# Patient Record
Sex: Female | Born: 1970 | Race: Black or African American | Hispanic: No | Marital: Married | Smoking: Former smoker
Health system: Southern US, Community
[De-identification: ages and names within clinical notes are randomized; demographics above are authoritative.]

## PROBLEM LIST (undated history)

## (undated) DIAGNOSIS — E785 Hyperlipidemia, unspecified: Secondary | ICD-10-CM

## (undated) DIAGNOSIS — E282 Polycystic ovarian syndrome: Secondary | ICD-10-CM

## (undated) DIAGNOSIS — L309 Dermatitis, unspecified: Secondary | ICD-10-CM

## (undated) DIAGNOSIS — IMO0002 Reserved for concepts with insufficient information to code with codable children: Secondary | ICD-10-CM

## (undated) DIAGNOSIS — D219 Benign neoplasm of connective and other soft tissue, unspecified: Secondary | ICD-10-CM

## (undated) DIAGNOSIS — E669 Obesity, unspecified: Secondary | ICD-10-CM

## (undated) DIAGNOSIS — G4730 Sleep apnea, unspecified: Secondary | ICD-10-CM

## (undated) DIAGNOSIS — I10 Essential (primary) hypertension: Secondary | ICD-10-CM

## (undated) HISTORY — DX: Polycystic ovarian syndrome: E28.2

## (undated) HISTORY — DX: Obesity, unspecified: E66.9

## (undated) HISTORY — PX: OOPHORECTOMY: SHX86

## (undated) HISTORY — DX: Essential (primary) hypertension: I10

## (undated) HISTORY — DX: Hyperlipidemia, unspecified: E78.5

## (undated) HISTORY — DX: Benign neoplasm of connective and other soft tissue, unspecified: D21.9

## (undated) HISTORY — DX: Dermatitis, unspecified: L30.9

## (undated) HISTORY — PX: BACK SURGERY: SHX140

---

## 1998-03-25 ENCOUNTER — Encounter: Payer: Self-pay | Admitting: Family Medicine

## 1998-03-25 ENCOUNTER — Ambulatory Visit (HOSPITAL_COMMUNITY): Admission: RE | Admit: 1998-03-25 | Discharge: 1998-03-25 | Payer: Self-pay | Admitting: Family Medicine

## 1998-06-04 ENCOUNTER — Encounter: Payer: Self-pay | Admitting: Obstetrics and Gynecology

## 1998-06-04 ENCOUNTER — Inpatient Hospital Stay (HOSPITAL_COMMUNITY): Admission: AD | Admit: 1998-06-04 | Discharge: 1998-06-04 | Payer: Self-pay | Admitting: Obstetrics and Gynecology

## 1998-06-13 ENCOUNTER — Ambulatory Visit (HOSPITAL_COMMUNITY): Admission: RE | Admit: 1998-06-13 | Discharge: 1998-06-13 | Payer: Self-pay | Admitting: Obstetrics and Gynecology

## 1999-07-19 ENCOUNTER — Encounter: Admission: RE | Admit: 1999-07-19 | Discharge: 1999-10-17 | Payer: Self-pay | Admitting: Gynecology

## 1999-09-17 ENCOUNTER — Encounter (INDEPENDENT_AMBULATORY_CARE_PROVIDER_SITE_OTHER): Payer: Self-pay | Admitting: Specialist

## 1999-09-17 ENCOUNTER — Inpatient Hospital Stay (HOSPITAL_COMMUNITY): Admission: AD | Admit: 1999-09-17 | Discharge: 1999-09-23 | Payer: Self-pay | Admitting: Internal Medicine

## 1999-09-21 ENCOUNTER — Encounter: Payer: Self-pay | Admitting: Gynecology

## 1999-09-26 ENCOUNTER — Inpatient Hospital Stay (HOSPITAL_COMMUNITY): Admission: EM | Admit: 1999-09-26 | Discharge: 1999-09-26 | Payer: Self-pay | Admitting: Gynecology

## 1999-09-28 ENCOUNTER — Inpatient Hospital Stay (HOSPITAL_COMMUNITY): Admission: AD | Admit: 1999-09-28 | Discharge: 1999-09-28 | Payer: Self-pay | Admitting: Obstetrics & Gynecology

## 2000-11-27 ENCOUNTER — Other Ambulatory Visit: Admission: RE | Admit: 2000-11-27 | Discharge: 2000-11-27 | Payer: Self-pay | Admitting: Gynecology

## 2001-12-26 ENCOUNTER — Other Ambulatory Visit: Admission: RE | Admit: 2001-12-26 | Discharge: 2001-12-26 | Payer: Self-pay | Admitting: Family Medicine

## 2003-02-04 ENCOUNTER — Other Ambulatory Visit: Admission: RE | Admit: 2003-02-04 | Discharge: 2003-02-04 | Payer: Self-pay | Admitting: Family Medicine

## 2004-02-07 ENCOUNTER — Other Ambulatory Visit: Admission: RE | Admit: 2004-02-07 | Discharge: 2004-02-07 | Payer: Self-pay | Admitting: Family Medicine

## 2004-12-18 ENCOUNTER — Ambulatory Visit (HOSPITAL_COMMUNITY): Admission: RE | Admit: 2004-12-18 | Discharge: 2004-12-18 | Payer: Self-pay | Admitting: Obstetrics and Gynecology

## 2004-12-18 ENCOUNTER — Ambulatory Visit: Payer: Self-pay | Admitting: Obstetrics and Gynecology

## 2004-12-21 ENCOUNTER — Ambulatory Visit: Payer: Self-pay | Admitting: Obstetrics & Gynecology

## 2005-01-20 ENCOUNTER — Inpatient Hospital Stay (HOSPITAL_COMMUNITY): Admission: AD | Admit: 2005-01-20 | Discharge: 2005-01-20 | Payer: Self-pay | Admitting: Obstetrics and Gynecology

## 2005-02-01 ENCOUNTER — Encounter (INDEPENDENT_AMBULATORY_CARE_PROVIDER_SITE_OTHER): Payer: Self-pay | Admitting: *Deleted

## 2005-02-01 ENCOUNTER — Inpatient Hospital Stay (HOSPITAL_COMMUNITY): Admission: RE | Admit: 2005-02-01 | Discharge: 2005-02-03 | Payer: Self-pay | Admitting: Obstetrics and Gynecology

## 2005-02-01 HISTORY — PX: TUBAL LIGATION: SHX77

## 2005-05-29 ENCOUNTER — Other Ambulatory Visit: Admission: RE | Admit: 2005-05-29 | Discharge: 2005-05-29 | Payer: Self-pay | Admitting: Obstetrics and Gynecology

## 2005-12-19 ENCOUNTER — Encounter: Admission: RE | Admit: 2005-12-19 | Discharge: 2005-12-19 | Payer: Self-pay | Admitting: Obstetrics and Gynecology

## 2006-10-08 ENCOUNTER — Other Ambulatory Visit: Admission: RE | Admit: 2006-10-08 | Discharge: 2006-10-08 | Payer: Self-pay | Admitting: Family Medicine

## 2006-10-09 ENCOUNTER — Ambulatory Visit: Payer: Self-pay | Admitting: Family Medicine

## 2006-10-09 ENCOUNTER — Encounter: Payer: Self-pay | Admitting: Family Medicine

## 2006-10-09 DIAGNOSIS — B369 Superficial mycosis, unspecified: Secondary | ICD-10-CM | POA: Insufficient documentation

## 2006-10-09 DIAGNOSIS — I10 Essential (primary) hypertension: Secondary | ICD-10-CM | POA: Insufficient documentation

## 2006-10-09 LAB — CONVERTED CEMR LAB
Bilirubin Urine: NEGATIVE
Blood in Urine, dipstick: NEGATIVE
Glucose, Urine, Semiquant: NEGATIVE
Ketones, urine, test strip: NEGATIVE
Nitrite: NEGATIVE
Protein, U semiquant: NEGATIVE
Specific Gravity, Urine: 1.01
Urobilinogen, UA: NEGATIVE
WBC Urine, dipstick: NEGATIVE
pH: 6.5

## 2006-10-14 LAB — CONVERTED CEMR LAB
ALT: 25 units/L (ref 0–40)
AST: 21 units/L (ref 0–37)
Albumin: 3.9 g/dL (ref 3.5–5.2)
Alkaline Phosphatase: 62 units/L (ref 39–117)
BUN: 8 mg/dL (ref 6–23)
Basophils Absolute: 0 10*3/uL (ref 0.0–0.1)
Basophils Relative: 0.7 % (ref 0.0–1.0)
Bilirubin, Direct: 0.1 mg/dL (ref 0.0–0.3)
CO2: 29 meq/L (ref 19–32)
Calcium: 9.3 mg/dL (ref 8.4–10.5)
Chloride: 106 meq/L (ref 96–112)
Cholesterol: 184 mg/dL (ref 0–200)
Creatinine, Ser: 0.7 mg/dL (ref 0.4–1.2)
Eosinophils Absolute: 0.1 10*3/uL (ref 0.0–0.6)
Eosinophils Relative: 1.8 % (ref 0.0–5.0)
GFR calc Af Amer: 122 mL/min
GFR calc non Af Amer: 101 mL/min
Glucose, Bld: 86 mg/dL (ref 70–99)
HCT: 42.3 % (ref 36.0–46.0)
HDL: 34.1 mg/dL — ABNORMAL LOW (ref >39.0)
Hemoglobin: 14 g/dL (ref 12.0–15.0)
LDL Cholesterol: 124 mg/dL — ABNORMAL HIGH (ref 0–99)
Lymphocytes Relative: 48.4 % — ABNORMAL HIGH (ref 12.0–46.0)
MCHC: 33.1 g/dL (ref 30.0–36.0)
MCV: 96.4 fL (ref 78.0–100.0)
Monocytes Absolute: 0.6 10*3/uL (ref 0.2–0.7)
Monocytes Relative: 8.2 % (ref 3.0–11.0)
Neutro Abs: 2.8 10*3/uL (ref 1.4–7.7)
Neutrophils Relative %: 40.9 % — ABNORMAL LOW (ref 43.0–77.0)
Platelets: 221 10*3/uL (ref 150–400)
Potassium: 3.7 meq/L (ref 3.5–5.1)
RBC: 4.39 M/uL (ref 3.87–5.11)
RDW: 12.7 % (ref 11.5–14.6)
Sodium: 140 meq/L (ref 135–145)
TSH: 1.8 microintl units/mL (ref 0.35–5.50)
Total Bilirubin: 0.6 mg/dL (ref 0.3–1.2)
Total CHOL/HDL Ratio: 5.4
Total Protein: 7.2 g/dL (ref 6.0–8.3)
Triglycerides: 130 mg/dL (ref 0–149)
VLDL: 26 mg/dL (ref 0–40)
WBC: 6.8 10*3/uL (ref 4.5–10.5)

## 2006-10-16 ENCOUNTER — Encounter (INDEPENDENT_AMBULATORY_CARE_PROVIDER_SITE_OTHER): Payer: Self-pay | Admitting: *Deleted

## 2006-10-18 ENCOUNTER — Encounter (INDEPENDENT_AMBULATORY_CARE_PROVIDER_SITE_OTHER): Payer: Self-pay | Admitting: *Deleted

## 2006-11-07 ENCOUNTER — Ambulatory Visit: Payer: Self-pay | Admitting: Family Medicine

## 2006-11-07 DIAGNOSIS — N760 Acute vaginitis: Secondary | ICD-10-CM | POA: Insufficient documentation

## 2007-02-04 ENCOUNTER — Ambulatory Visit: Payer: Self-pay | Admitting: Family Medicine

## 2007-02-04 DIAGNOSIS — J069 Acute upper respiratory infection, unspecified: Secondary | ICD-10-CM | POA: Insufficient documentation

## 2007-02-04 DIAGNOSIS — H109 Unspecified conjunctivitis: Secondary | ICD-10-CM | POA: Insufficient documentation

## 2007-05-22 ENCOUNTER — Ambulatory Visit: Payer: Self-pay | Admitting: Internal Medicine

## 2007-05-22 DIAGNOSIS — E785 Hyperlipidemia, unspecified: Secondary | ICD-10-CM | POA: Insufficient documentation

## 2007-05-22 DIAGNOSIS — E8881 Metabolic syndrome: Secondary | ICD-10-CM | POA: Insufficient documentation

## 2007-12-26 ENCOUNTER — Ambulatory Visit: Payer: Self-pay | Admitting: Family Medicine

## 2008-01-04 LAB — CONVERTED CEMR LAB
ALT: 30 units/L (ref 0–35)
AST: 25 units/L (ref 0–37)
Albumin: 3.9 g/dL (ref 3.5–5.2)
Alkaline Phosphatase: 60 units/L (ref 39–117)
BUN: 10 mg/dL (ref 6–23)
Bilirubin, Direct: 0.1 mg/dL (ref 0.0–0.3)
CO2: 27 meq/L (ref 19–32)
Calcium: 9.3 mg/dL (ref 8.4–10.5)
Chloride: 108 meq/L (ref 96–112)
Cholesterol: 155 mg/dL (ref 0–200)
Creatinine, Ser: 0.7 mg/dL (ref 0.4–1.2)
GFR calc Af Amer: 121 mL/min
GFR calc non Af Amer: 100 mL/min
Glucose, Bld: 68 mg/dL — ABNORMAL LOW (ref 70–99)
HDL: 35.8 mg/dL — ABNORMAL LOW (ref >39.0)
LDL Cholesterol: 101 mg/dL — ABNORMAL HIGH (ref 0–99)
Potassium: 3.7 meq/L (ref 3.5–5.1)
Sodium: 142 meq/L (ref 135–145)
Total Bilirubin: 0.8 mg/dL (ref 0.3–1.2)
Total CHOL/HDL Ratio: 4.3
Total Protein: 7.3 g/dL (ref 6.0–8.3)
Triglycerides: 90 mg/dL (ref 0–149)
VLDL: 18 mg/dL (ref 0–40)

## 2008-01-05 ENCOUNTER — Encounter (INDEPENDENT_AMBULATORY_CARE_PROVIDER_SITE_OTHER): Payer: Self-pay | Admitting: *Deleted

## 2008-02-02 ENCOUNTER — Encounter: Payer: Self-pay | Admitting: Family Medicine

## 2008-04-07 ENCOUNTER — Encounter: Payer: Self-pay | Admitting: Family Medicine

## 2008-04-07 ENCOUNTER — Other Ambulatory Visit: Admission: RE | Admit: 2008-04-07 | Discharge: 2008-04-07 | Payer: Self-pay | Admitting: Family Medicine

## 2008-04-07 ENCOUNTER — Ambulatory Visit: Payer: Self-pay | Admitting: Family Medicine

## 2008-04-15 LAB — CONVERTED CEMR LAB
BUN: 8 mg/dL (ref 6–23)
Basophils Absolute: 0 10*3/uL (ref 0.0–0.1)
Basophils Relative: 0.1 % (ref 0.0–3.0)
CO2: 27 meq/L (ref 19–32)
Calcium: 9.5 mg/dL (ref 8.4–10.5)
Chloride: 106 meq/L (ref 96–112)
Creatinine, Ser: 0.8 mg/dL (ref 0.4–1.2)
Eosinophils Absolute: 0.2 10*3/uL (ref 0.0–0.7)
Eosinophils Relative: 2.6 % (ref 0.0–5.0)
GFR calc Af Amer: 104 mL/min
GFR calc non Af Amer: 86 mL/min
Glucose, Bld: 86 mg/dL (ref 70–99)
HCT: 39.2 % (ref 36.0–46.0)
Hemoglobin: 13.6 g/dL (ref 12.0–15.0)
Lymphocytes Relative: 47 % — ABNORMAL HIGH (ref 12.0–46.0)
MCHC: 34.8 g/dL (ref 30.0–36.0)
MCV: 94.7 fL (ref 78.0–100.0)
Monocytes Absolute: 0.7 10*3/uL (ref 0.1–1.0)
Monocytes Relative: 8.6 % (ref 3.0–12.0)
Neutro Abs: 3.2 10*3/uL (ref 1.4–7.7)
Neutrophils Relative %: 41.7 % — ABNORMAL LOW (ref 43.0–77.0)
Platelets: 187 10*3/uL (ref 150–400)
Potassium: 3.8 meq/L (ref 3.5–5.1)
RBC: 4.13 M/uL (ref 3.87–5.11)
RDW: 12.3 % (ref 11.5–14.6)
Sodium: 139 meq/L (ref 135–145)
TSH: 1.78 microintl units/mL (ref 0.35–5.50)
WBC: 7.6 10*3/uL (ref 4.5–10.5)

## 2008-04-19 ENCOUNTER — Encounter (INDEPENDENT_AMBULATORY_CARE_PROVIDER_SITE_OTHER): Payer: Self-pay | Admitting: *Deleted

## 2008-05-21 ENCOUNTER — Ambulatory Visit: Payer: Self-pay | Admitting: Family Medicine

## 2008-06-18 ENCOUNTER — Ambulatory Visit: Payer: Self-pay | Admitting: Family Medicine

## 2008-07-15 ENCOUNTER — Ambulatory Visit: Payer: Self-pay | Admitting: Family Medicine

## 2008-11-01 ENCOUNTER — Encounter: Payer: Self-pay | Admitting: Family Medicine

## 2008-12-03 ENCOUNTER — Ambulatory Visit: Payer: Self-pay | Admitting: Family Medicine

## 2008-12-03 DIAGNOSIS — F17200 Nicotine dependence, unspecified, uncomplicated: Secondary | ICD-10-CM | POA: Insufficient documentation

## 2009-01-07 ENCOUNTER — Ambulatory Visit: Payer: Self-pay | Admitting: Family Medicine

## 2009-03-07 ENCOUNTER — Ambulatory Visit: Payer: Self-pay | Admitting: Family Medicine

## 2009-03-07 DIAGNOSIS — R5383 Other fatigue: Secondary | ICD-10-CM

## 2009-03-07 DIAGNOSIS — F3289 Other specified depressive episodes: Secondary | ICD-10-CM | POA: Insufficient documentation

## 2009-03-07 DIAGNOSIS — F329 Major depressive disorder, single episode, unspecified: Secondary | ICD-10-CM | POA: Insufficient documentation

## 2009-03-07 DIAGNOSIS — R5381 Other malaise: Secondary | ICD-10-CM | POA: Insufficient documentation

## 2009-03-17 LAB — CONVERTED CEMR LAB: Vit D, 25-Hydroxy: 6 ng/mL — ABNORMAL LOW (ref 30–89)

## 2009-03-23 ENCOUNTER — Telehealth (INDEPENDENT_AMBULATORY_CARE_PROVIDER_SITE_OTHER): Payer: Self-pay | Admitting: *Deleted

## 2009-03-23 LAB — CONVERTED CEMR LAB
ALT: 26 units/L (ref 0–35)
AST: 21 units/L (ref 0–37)
Albumin: 3.6 g/dL (ref 3.5–5.2)
Alkaline Phosphatase: 53 units/L (ref 39–117)
BUN: 9 mg/dL (ref 6–23)
Basophils Relative: 2 % (ref 0.0–3.0)
Bilirubin, Direct: 0.1 mg/dL (ref 0.0–0.3)
CO2: 27 meq/L (ref 19–32)
Calcium: 8.6 mg/dL (ref 8.4–10.5)
Chloride: 108 meq/L (ref 96–112)
Creatinine, Ser: 0.6 mg/dL (ref 0.4–1.2)
Eosinophils Relative: 4 % (ref 0.0–5.0)
Folate: 9.1 ng/mL
GFR calc non Af Amer: 143.63 mL/min (ref >60)
Glucose, Bld: 99 mg/dL (ref 70–99)
HCT: 35.4 % — ABNORMAL LOW (ref 36.0–46.0)
Hemoglobin: 12.4 g/dL (ref 12.0–15.0)
Lymphocytes Relative: 60 % — ABNORMAL HIGH (ref 12.0–46.0)
MCHC: 35.1 g/dL (ref 30.0–36.0)
MCV: 99.5 fL (ref 78.0–100.0)
Monocytes Relative: 9 % (ref 3.0–12.0)
Neutrophils Relative %: 25 % — ABNORMAL LOW (ref 43.0–77.0)
Platelets: 167 10*3/uL (ref 150.0–400.0)
Potassium: 3.6 meq/L (ref 3.5–5.1)
RBC: 3.56 M/uL — ABNORMAL LOW (ref 3.87–5.11)
RDW: 13 % (ref 11.5–14.6)
Sodium: 143 meq/L (ref 135–145)
TSH: 1.77 microintl units/mL (ref 0.35–5.50)
Total Bilirubin: 0.7 mg/dL (ref 0.3–1.2)
Total Protein: 6.4 g/dL (ref 6.0–8.3)
Vitamin B-12: 729 pg/mL (ref 211–911)
WBC: 4.9 10*3/uL (ref 4.5–10.5)

## 2009-05-18 ENCOUNTER — Encounter (INDEPENDENT_AMBULATORY_CARE_PROVIDER_SITE_OTHER): Payer: Self-pay | Admitting: *Deleted

## 2009-05-18 ENCOUNTER — Other Ambulatory Visit: Admission: RE | Admit: 2009-05-18 | Discharge: 2009-05-18 | Payer: Self-pay | Admitting: Family Medicine

## 2009-05-18 ENCOUNTER — Ambulatory Visit: Payer: Self-pay | Admitting: Family Medicine

## 2009-05-18 DIAGNOSIS — R079 Chest pain, unspecified: Secondary | ICD-10-CM | POA: Insufficient documentation

## 2009-05-20 ENCOUNTER — Telehealth (INDEPENDENT_AMBULATORY_CARE_PROVIDER_SITE_OTHER): Payer: Self-pay | Admitting: *Deleted

## 2009-05-24 ENCOUNTER — Encounter (INDEPENDENT_AMBULATORY_CARE_PROVIDER_SITE_OTHER): Payer: Self-pay | Admitting: *Deleted

## 2009-06-13 ENCOUNTER — Telehealth (INDEPENDENT_AMBULATORY_CARE_PROVIDER_SITE_OTHER): Payer: Self-pay | Admitting: *Deleted

## 2009-06-16 ENCOUNTER — Ambulatory Visit: Payer: Self-pay | Admitting: Internal Medicine

## 2009-06-16 ENCOUNTER — Ambulatory Visit (HOSPITAL_COMMUNITY): Admission: RE | Admit: 2009-06-16 | Discharge: 2009-06-16 | Payer: Self-pay | Admitting: Family Medicine

## 2009-06-16 ENCOUNTER — Ambulatory Visit: Payer: Self-pay

## 2009-06-16 ENCOUNTER — Encounter: Payer: Self-pay | Admitting: Family Medicine

## 2009-06-27 ENCOUNTER — Telehealth (INDEPENDENT_AMBULATORY_CARE_PROVIDER_SITE_OTHER): Payer: Self-pay | Admitting: *Deleted

## 2009-06-28 ENCOUNTER — Encounter: Payer: Self-pay | Admitting: Family Medicine

## 2009-07-01 ENCOUNTER — Ambulatory Visit: Payer: Self-pay | Admitting: Family Medicine

## 2009-07-01 DIAGNOSIS — E876 Hypokalemia: Secondary | ICD-10-CM | POA: Insufficient documentation

## 2009-07-27 ENCOUNTER — Telehealth (INDEPENDENT_AMBULATORY_CARE_PROVIDER_SITE_OTHER): Payer: Self-pay | Admitting: *Deleted

## 2009-07-28 ENCOUNTER — Encounter: Payer: Self-pay | Admitting: Family Medicine

## 2009-08-11 ENCOUNTER — Ambulatory Visit: Payer: Self-pay | Admitting: Family Medicine

## 2009-08-16 LAB — CONVERTED CEMR LAB
BUN: 13 mg/dL (ref 6–23)
CO2: 25 meq/L (ref 19–32)
Calcium: 9.4 mg/dL (ref 8.4–10.5)
Chloride: 103 meq/L (ref 96–112)
Creatinine, Ser: 0.64 mg/dL (ref 0.40–1.20)
Glucose, Bld: 99 mg/dL (ref 70–99)
Potassium: 4.2 meq/L (ref 3.5–5.3)
Sodium: 138 meq/L (ref 135–145)

## 2009-08-17 ENCOUNTER — Encounter: Payer: Self-pay | Admitting: Family Medicine

## 2009-08-18 ENCOUNTER — Telehealth (INDEPENDENT_AMBULATORY_CARE_PROVIDER_SITE_OTHER): Payer: Self-pay | Admitting: *Deleted

## 2009-09-14 ENCOUNTER — Encounter: Payer: Self-pay | Admitting: Family Medicine

## 2009-09-30 ENCOUNTER — Ambulatory Visit (HOSPITAL_COMMUNITY): Admission: RE | Admit: 2009-09-30 | Discharge: 2009-09-30 | Payer: Self-pay | Admitting: General Surgery

## 2009-10-06 ENCOUNTER — Encounter: Admission: RE | Admit: 2009-10-06 | Discharge: 2009-10-06 | Payer: Self-pay | Admitting: General Surgery

## 2009-10-07 ENCOUNTER — Ambulatory Visit (HOSPITAL_COMMUNITY): Admission: RE | Admit: 2009-10-07 | Discharge: 2009-10-07 | Payer: Self-pay | Admitting: General Surgery

## 2009-10-07 ENCOUNTER — Ambulatory Visit: Payer: Self-pay | Admitting: Family Medicine

## 2009-11-10 ENCOUNTER — Ambulatory Visit: Payer: Self-pay | Admitting: Family Medicine

## 2009-12-08 ENCOUNTER — Encounter: Admission: RE | Admit: 2009-12-08 | Discharge: 2010-01-19 | Payer: Self-pay | Admitting: General Surgery

## 2009-12-09 ENCOUNTER — Ambulatory Visit: Payer: Self-pay | Admitting: Family Medicine

## 2010-01-09 ENCOUNTER — Telehealth (INDEPENDENT_AMBULATORY_CARE_PROVIDER_SITE_OTHER): Payer: Self-pay | Admitting: *Deleted

## 2010-01-13 ENCOUNTER — Ambulatory Visit: Payer: Self-pay | Admitting: Family Medicine

## 2010-01-13 DIAGNOSIS — Z87891 Personal history of nicotine dependence: Secondary | ICD-10-CM | POA: Insufficient documentation

## 2010-01-20 ENCOUNTER — Telehealth (INDEPENDENT_AMBULATORY_CARE_PROVIDER_SITE_OTHER): Payer: Self-pay | Admitting: *Deleted

## 2010-02-23 ENCOUNTER — Encounter: Payer: Self-pay | Admitting: Family Medicine

## 2010-04-05 ENCOUNTER — Encounter: Payer: Self-pay | Admitting: Family Medicine

## 2010-05-21 LAB — CONVERTED CEMR LAB
ALT: 28 units/L (ref 0–35)
AST: 25 units/L (ref 0–37)
Albumin: 3.9 g/dL (ref 3.5–5.2)
Alkaline Phosphatase: 60 units/L (ref 39–117)
BUN: 8 mg/dL (ref 6–23)
Basophils Absolute: 0 10*3/uL (ref 0.0–0.1)
Basophils Relative: 1 % (ref 0–1)
Bilirubin Urine: NEGATIVE
Bilirubin, Direct: 0.1 mg/dL (ref 0.0–0.3)
Blood in Urine, dipstick: NEGATIVE
CK-MB: 1.7 ng/mL (ref 0.3–4.0)
CO2: 28 meq/L (ref 19–32)
Calcium: 9.6 mg/dL (ref 8.4–10.5)
Chloride: 104 meq/L (ref 96–112)
Cholesterol: 145 mg/dL (ref 0–200)
Creatinine, Ser: 0.6 mg/dL (ref 0.4–1.2)
Eosinophils Absolute: 0.1 10*3/uL (ref 0.0–0.7)
Eosinophils Relative: 3 % (ref 0–5)
GFR calc non Af Amer: 143.49 mL/min (ref >60)
Glucose, Bld: 86 mg/dL (ref 70–99)
Glucose, Urine, Semiquant: NEGATIVE
HCT: 44.3 % (ref 36.0–46.0)
HDL: 37.8 mg/dL — ABNORMAL LOW (ref >39.00)
Hemoglobin: 14.4 g/dL (ref 12.0–15.0)
Ketones, urine, test strip: NEGATIVE
LDL Cholesterol: 93 mg/dL (ref 0–99)
Lymphocytes Relative: 60 % — ABNORMAL HIGH (ref 12–46)
Lymphs Abs: 2.4 10*3/uL (ref 0.7–4.0)
MCHC: 32.5 g/dL (ref 30.0–36.0)
MCV: 96.1 fL (ref 78.0–100.0)
Monocytes Absolute: 0.5 10*3/uL (ref 0.1–1.0)
Monocytes Relative: 13 % — ABNORMAL HIGH (ref 3–12)
Neutro Abs: 0.9 10*3/uL — ABNORMAL LOW (ref 1.7–7.7)
Neutrophils Relative %: 23 % — ABNORMAL LOW (ref 43–77)
Nitrite: NEGATIVE
Pap Smear: NEGATIVE
Platelets: 210 10*3/uL (ref 150–400)
Potassium: 3.6 meq/L (ref 3.5–5.1)
Protein, U semiquant: NEGATIVE
RBC: 4.61 M/uL (ref 3.87–5.11)
RDW: 13.5 % (ref 11.5–15.5)
Relative Index: 1.4 (ref 0.0–2.5)
Sodium: 141 meq/L (ref 135–145)
Specific Gravity, Urine: >=1.03
TSH: 1.56 microintl units/mL (ref 0.35–5.50)
Total Bilirubin: 0.6 mg/dL (ref 0.3–1.2)
Total CHOL/HDL Ratio: 4
Total CK: 120 units/L (ref 7–177)
Total Protein: 8.1 g/dL (ref 6.0–8.3)
Triglycerides: 73 mg/dL (ref 0.0–149.0)
Urobilinogen, UA: NEGATIVE
VLDL: 14.6 mg/dL (ref 0.0–40.0)
WBC Urine, dipstick: NEGATIVE
WBC: 4 10*3/uL (ref 4.0–10.5)
pH: 5

## 2010-05-25 NOTE — Medication Information (Signed)
Summary: Noncompliance with Almod[facility]  Noncompliance with [facility]   Imported By: Lanelle Bal 02/28/2010 14:00:52  _____________________________________________________________________  External Attachment:    Type:   Image     Comment:   External Document

## 2010-05-25 NOTE — Progress Notes (Signed)
Summary: refill  Phone Note Refill Request Message from:  Fax from Pharmacy on January 09, 2010 8:27 AM  Refills Requested: Medication #1:  HYDROCHLOROTHIAZIDE 25 MG  TABS 1/2 -1 by mouth once daily [facility] -   fax [phone]  Initial call taken by: Okey Regal [place],  January 09, 2010 8:28 AM    Prescriptions: HYDROCHLOROTHIAZIDE 25 MG  TABS (HYDROCHLOROTHIAZIDE) 1/2 -1 by mouth once daily  #100 x 1   Entered by:   Almeta Monas CMA (AAMA)   Authorized by:   Loreen Freud DO   Signed by:   Almeta Monas CMA (AAMA) on 01/09/2010   Method used:   Faxed to ...       [facility]  [address] [phone]* (retail)       [address]       [place], [place]  [address]       Ph: [phone]       Fax: [phone]   RxID:   [id number]

## 2010-05-25 NOTE — Letter (Signed)
Summary: Records Dated 02-05-05 thru 05-29-05/[facility]  Records Dated 02-05-05 thru 05-29-05/[facility]   Imported By: Lanelle Bal 08/17/2009 12:50:51  _____________________________________________________________________  External Attachment:    Type:   Image     Comment:   External Document

## 2010-05-25 NOTE — Medication Information (Signed)
Summary: Nonadherence with [facility]ine/[facility]  Nonadherence with [facility]   Imported By: Lanelle Bal 04/13/2010 11:47:20  _____________________________________________________________________  External Attachment:    Type:   Image     Comment:   External Document

## 2010-05-25 NOTE — Progress Notes (Signed)
Summary: Results   Phone Note Outgoing Call   Call placed by: Army Fossa CMA,  June 27, 2009 1:02 PM Reason for Call: Discuss lab or test results Summary of Call: Regarding results, LMTCB:  Echo: normal Signed by Loreen Freud DO on 06/26/2009 at 8:55 AM  Follow-up for Phone Call        Pt is aware. Army Fossa CMA  June 27, 2009 1:31 PM

## 2010-05-25 NOTE — Assessment & Plan Note (Signed)
Summary: f/u per pt//lch   Vital Signs:  Patient profile:   40 year old female Weight:      260 pounds Temp:     98.2 degrees F oral Pulse rate:   80 / minute BP sitting:   120 / 80  Vitals Entered By: Jeremy Johann CMA (July 01, 2009 12:54 PM) CC: discuss meds, labs Comments REVIEWED MED LIST, PATIENT AGREED DOSE AND INSTRUCTION CORRECT    History of Present Illness:  Hypertension follow-up      This is a 40 year old woman who presents for Hypertension follow-up.  The patient denies lightheadedness, urinary frequency, headaches, edema, impotence, rash, and fatigue.  The patient denies the following associated symptoms: chest pain, chest pressure, exercise intolerance, dyspnea, palpitations, syncope, leg edema, and pedal edema.  Compliance with medications (by patient report) has been near 100%.  The patient reports that dietary compliance has been good.  The patient reports no exercise.  Adjunctive measures currently used by the patient include salt restriction.    Current Medications (verified): 1)  Lotrel 5-20 Mg Caps (Amlodipine Besy-Benazepril Hcl) .... Takeone Tablet By Mouth Once Daily 2)  Hydrochlorothiazide 25 Mg  Tabs (Hydrochlorothiazide) .... 1/2 -1 By Mouth Once Daily 3)  Vitamin D (Ergocalciferol) 50000 Unit Caps (Ergocalciferol) .... Take One Tablet Weekly 4)  Aspirin 81 Mg Tbec (Aspirin) .Marland Kitchen.. 1 By Mouth Once Daily 5)  Klor-Con M20 20 Meq Cr-Tabs (Potassium Chloride Crys Cr) .... Take One Tablet Daily  Allergies (verified): No Known Drug Allergies  Past History:  Past Medical History: Last updated: 10/09/2006 Polycystic (Lt) ovary Eczema Asthma as a child Smoker Stress card- nl-02/24/2003 Hypertension  Past Surgical History: Last updated: 10/09/2006 Tubal ligation (02/01/2005) Caesarean section (02/01/2005)  Family History: Last updated: 04/07/2008 Family History Hypertension : F, M,2 sisters Fam hx CAD: F stent Family History Kidney  disease M--schizophrenia, dementia  Social History: Last updated: 04/07/2008 Occupation:[employer],  [employer] Current Smoker Single---dating and sexually active with one person Alcohol use-yes Drug use-no Regular exercise-yes  Risk Factors: Alcohol Use: <1 (05/18/2009) Caffeine Use: 0 (05/18/2009) Exercise: no (05/18/2009)  Risk Factors: Smoking Status: current (05/18/2009) Packs/Day: 1.0 (05/18/2009) Passive Smoke Exposure: yes (05/18/2009)  Review of Systems      See HPI  Physical Exam  General:  Well-developed,well-nourished,in no acute distress; alert,appropriate and cooperative throughout examination Lungs:  Normal respiratory effort, chest expands symmetrically. Lungs are clear to auscultation, no crackles or wheezes. Heart:  normal rate and no murmur.   Extremities:  No clubbing, cyanosis, edema, or deformity noted with normal full range of motion of all joints.   Psych:  Oriented X3 and normally interactive.     Impression & Recommendations:  Problem # 1:  HYPERTENSION (ICD-401.9)  Her updated medication list for this problem includes:    Lotrel 5-20 Mg Caps (Amlodipine besy-benazepril hcl) .Marland Kitchen... Takeone tablet by mouth once daily    Hydrochlorothiazide 25 Mg Tabs (Hydrochlorothiazide) .Marland Kitchen... 1/2 -1 by mouth once daily  BP today: 120/80 Prior BP: 122/80 (05/18/2009)  Prior 10 Yr Risk Heart Disease: 6 % (05/22/2007)  Labs Reviewed: K+: 3.6 (05/18/2009) Creat: : 0.6 (05/18/2009)   Chol: 145 (05/18/2009)   HDL: 37.80 (05/18/2009)   LDL: 93 (05/18/2009)   TG: 73.0 (05/18/2009)  Problem # 2:  MORBID OBESITY (ICD-278.01)  pt interested in lap band in the process of getting info together Ht: 65.75 (05/18/2009)   Wt: 260 (07/01/2009)   BMI: 41.13 (05/18/2009)  Orders: Tobacco use cessation  intermediate 3-10 minutes ([address])  Problem # 3:  HYPOKALEMIA (ICD-276.8)  kcl sent to pharmacy  Orders: Tobacco use cessation intermediate 3-10 minutes  ([address])  Complete Medication List: 1)  Lotrel 5-20 Mg Caps (Amlodipine besy-benazepril hcl) .... Takeone tablet by mouth once daily 2)  Hydrochlorothiazide 25 Mg Tabs (Hydrochlorothiazide) .... 1/2 -1 by mouth once daily 3)  Vitamin D (ergocalciferol) 50000 Unit Caps (Ergocalciferol) .... Take one tablet weekly 4)  Aspirin 81 Mg Tbec (Aspirin) .Marland Kitchen.. 1 by mouth once daily 5)  Klor-con M20 20 Meq Cr-tabs (Potassium chloride crys cr) .... Take one tablet daily  Patient Instructions: 1)  check vita D and bmp in 1 month Prescriptions: KLOR-CON M20 20 MEQ CR-TABS (POTASSIUM CHLORIDE CRYS CR) take one tablet daily  #30 x 2   Entered and Authorized by:   Loreen Freud DO   Signed by:   Loreen Freud DO on 07/01/2009   Method used:   Electronically to        [facility] [phone]* (retail)       [address]       [place], [place]  [address]       Ph: [phone]       Fax: [phone]   RxID:   [id number] VITAMIN D (ERGOCALCIFEROL) 50000 UNIT CAPS (ERGOCALCIFEROL) take one tablet weekly  #4 x 2   Entered and Authorized by:   Loreen Freud DO   Signed by:   Loreen Freud DO on 07/01/2009   Method used:   Electronically to        [facility] [phone]* (retail)       [address]       [place], [place]  [address]       Ph: [phone]       Fax: [phone]   RxID:   [id number]

## 2010-05-25 NOTE — Assessment & Plan Note (Signed)
Summary: rto 1 month/cbs   Vital Signs:  Patient profile:   40 year old female Height:      65 inches Weight:      264 pounds BMI:     44.09 Temp:     99.3 degrees F oral Pulse rate:   82 / minute BP sitting:   110 / 70  (left arm)  Vitals Entered By: Jeremy Johann CMA (December 09, 2009 3:29 PM) CC: 1 month f/u, new rx chantix   History of Present Illness: Pt here to discuss weight loss--she will start doing mostly protein diet with nutritionist.  she is walking every day for 15 minutes---she is working up to .   Current Medications (verified): 1)  Lotrel 5-20 Mg Caps (Amlodipine Besy-Benazepril Hcl) .... Takeone Tablet By Mouth Once Daily 2)  Hydrochlorothiazide 25 Mg  Tabs (Hydrochlorothiazide) .... 1/2 -1 By Mouth Once Daily 3)  Aspirin 81 Mg Tbec (Aspirin) .Marland Kitchen.. 1 By Mouth Once Daily 4)  Chantix Starting Month Pak 0.5 Mg X 11 & 1 Mg X 42 Tabs (Varenicline Tartrate) .... As Directed  Allergies (verified): No Known Drug Allergies  Past History:  Past medical, surgical, family and social histories (including risk factors) reviewed for relevance to current acute and chronic problems.  Past Medical History: Reviewed history from 10/07/2009 and no changes required. Polycystic (Lt) ovary Eczema Asthma as a child Smoker Stress card- nl-02/24/2003 Hypertension Hyperlipidemia  Past Surgical History: Reviewed history from 10/09/2006 and no changes required. Tubal ligation (02/01/2005) Caesarean section (02/01/2005)  Family History: Reviewed history from 04/07/2008 and no changes required. Family History Hypertension : F, M,2 sisters Fam hx CAD: F stent Family History Kidney disease M--schizophrenia, dementia  Social History: Reviewed history from 04/07/2008 and no changes required. Occupation:[employer],  [employer] Current Smoker Single---dating and sexually active with one person Alcohol use-yes Drug use-no Regular exercise-yes  Review of  Systems      See HPI  Physical Exam  General:  Well-developed,well-nourished,in no acute distress; alert,appropriate and cooperative throughout examination Lungs:  Normal respiratory effort, chest expands symmetrically. Lungs are clear to auscultation, no crackles or wheezes. Heart:  normal rate and no murmur.   Extremities:  1+ left pedal edema and 1+ right pedal edema.     Impression & Recommendations:  Problem # 1:  MORBID OBESITY (ICD-278.01) con't diet , exercise ---rto 1 month con't working with nutritiionist Ht: 65 (12/09/2009)   Wt: 264 (12/09/2009)   BMI: 44.09 (12/09/2009)  Problem # 2:  TOBACCO USER (ICD-305.1)  Her updated medication list for this problem includes:    Chantix Starting Month Pak 0.5 Mg X 11 & 1 Mg X 42 Tabs (Varenicline tartrate) .Marland Kitchen... As directed  Encouraged smoking cessation and discussed different methods for smoking cessation.   Problem # 3:  DYSMETABOLIC SYNDROME X (ICD-277.7)  Problem # 4:  HYPERTENSION (ICD-401.9)  Her updated medication list for this problem includes:    Lotrel 5-20 Mg Caps (Amlodipine besy-benazepril hcl) .Marland Kitchen... Takeone tablet by mouth once daily    Hydrochlorothiazide 25 Mg Tabs (Hydrochlorothiazide) .Marland Kitchen... 1/2 -1 by mouth once daily  BP today: 110/70 Prior BP: 118/70 (11/10/2009)  Prior 10 Yr Risk Heart Disease: 6 % (05/22/2007)  Labs Reviewed: K+: 4.2 (08/11/2009) Creat: : 0.64 (08/11/2009)   Chol: 145 (05/18/2009)   HDL: 37.80 (05/18/2009)   LDL: 93 (05/18/2009)   TG: 73.0 (05/18/2009)  Complete Medication List: 1)  Lotrel 5-20 Mg Caps (Amlodipine besy-benazepril hcl) .... Takeone tablet by  mouth once daily 2)  Hydrochlorothiazide 25 Mg Tabs (Hydrochlorothiazide) .... 1/2 -1 by mouth once daily 3)  Aspirin 81 Mg Tbec (Aspirin) .Marland Kitchen.. 1 by mouth once daily 4)  Chantix Starting Month Pak 0.5 Mg X 11 & 1 Mg X 42 Tabs (Varenicline tartrate) .... As directed  Patient Instructions: 1)  Please schedule a follow-up  appointment in 1 month.  Prescriptions: CHANTIX STARTING MONTH PAK 0.5 MG X 11 & 1 MG X 42 TABS (VARENICLINE TARTRATE) as directed  #1 x 0   Entered and Authorized by:   Loreen Freud DO   Signed by:   Loreen Freud DO on 12/09/2009   Method used:   Electronically to        [facility] [phone]* (retail)       [address]       [place], [place]  [address]       Ph: [phone]       Fax: [phone]   RxID:   [id number] CHANTIX STARTING MONTH PAK 0.5 MG X 11 & 1 MG X 42 TABS (VARENICLINE TARTRATE) as directed  #1 x 0   Entered and Authorized by:   Loreen Freud DO   Signed by:   Loreen Freud DO on 12/09/2009   Method used:   Electronically to        [facility] [phone]* (retail)       [address]       [place], [place]  [address]       Ph: [phone]       Fax: [phone]   RxID:   [id number]

## 2010-05-25 NOTE — Consult Note (Signed)
Summary: [facility]   Imported By: Lanelle Bal 12/05/2009 14:19:15  _____________________________________________________________________  External Attachment:    Type:   Image     Comment:   External Document

## 2010-05-25 NOTE — Assessment & Plan Note (Signed)
Summary: RTO 3 MONTHS.CBS   Vital Signs:  Patient profile:   40 year old female Height:      65.75 inches Weight:      268 pounds Temp:     99.1 degrees F oral Pulse rate:   86 / minute BP sitting:   118 / 70  (left arm)  Vitals Entered By: Jeremy Johann CMA (November 10, 2009 4:02 PM) CC: 3 MONTH F/U   History of Present Illness: Pt here for 3 month f/u.  Pt is not exercising or following any diet.  She has seen the nutritionist 1x.     Preventive Screening-Counseling & Management  Alcohol-Tobacco     Alcohol drinks/day: <1     Alcohol type: beer/ liquor     Smoking Status: current     Smoking Cessation Counseling: yes     Smoke Cessation Stage: precontemplative     Packs/Day: 1.0     Year Started: 1993     Passive Smoke Exposure: yes  Caffeine-Diet-Exercise     Caffeine use/day: 0     Does Patient Exercise: no     Type of exercise: treadmill     Times/week: 4     Exercise Counseling: to improve exercise regimen  Current Medications (verified): 1)  Lotrel 5-20 Mg Caps (Amlodipine Besy-Benazepril Hcl) .... Takeone Tablet By Mouth Once Daily 2)  Hydrochlorothiazide 25 Mg  Tabs (Hydrochlorothiazide) .... 1/2 -1 By Mouth Once Daily 3)  Vitamin D (Ergocalciferol) 50000 Unit Caps (Ergocalciferol) .... Take One Tablet Weekly 4)  Aspirin 81 Mg Tbec (Aspirin) .Marland Kitchen.. 1 By Mouth Once Daily 5)  Klor-Con M20 20 Meq Cr-Tabs (Potassium Chloride Crys Cr) .... Take One Tablet Daily 6)  Chantix Starting Month Pak 0.5 Mg X 11 & 1 Mg X 42 Tabs (Varenicline Tartrate) .... As Directed  Allergies (verified): No Known Drug Allergies  Past History:  Past medical, surgical, family and social histories (including risk factors) reviewed for relevance to current acute and chronic problems.  Past Medical History: Reviewed history from 10/07/2009 and no changes required. Polycystic (Lt) ovary Eczema Asthma as a child Smoker Stress card- nl-02/24/2003 Hypertension Hyperlipidemia  Past  Surgical History: Reviewed history from 10/09/2006 and no changes required. Tubal ligation (02/01/2005) Caesarean section (02/01/2005)  Family History: Reviewed history from 04/07/2008 and no changes required. Family History Hypertension : F, M,2 sisters Fam hx CAD: F stent Family History Kidney disease M--schizophrenia, dementia  Social History: Reviewed history from 04/07/2008 and no changes required. 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 Lungs:  Normal respiratory effort, chest expands symmetrically. Lungs are clear to auscultation, no crackles or wheezes. Heart:  normal rate and no murmur.   Abdomen:  obese Extremities:  No clubbing, cyanosis, edema, or deformity noted with normal full range of motion of all joints.   Psych:  Oriented X3 and normally interactive.     Impression & Recommendations:  Problem # 1:  MORBID OBESITY (ICD-278.01)  Ht: 65.75 (11/10/2009)   Wt: 268 (11/10/2009)   BMI: 41.13 (05/18/2009)  Problem # 2:  HYPERTENSION (ICD-401.9)  Her updated medication list for this problem includes:    Lotrel 5-20 Mg Caps (Amlodipine besy-benazepril hcl) .Marland Kitchen... Takeone tablet by mouth once  daily    Hydrochlorothiazide 25 Mg Tabs (Hydrochlorothiazide) .Marland Kitchen... 1/2 -1 by mouth once daily  BP today: 118/70 Prior BP: 126/82 (10/07/2009)  Prior 10 Yr Risk Heart Disease: 6 % (05/22/2007)  Labs Reviewed: K+: 4.2 (08/11/2009) Creat: : 0.64 (08/11/2009)   Chol: 145 (05/18/2009)   HDL: 37.80 (05/18/2009)   LDL: 93 (05/18/2009)   TG: 73.0 (05/18/2009)  Complete Medication List: 1)  Lotrel 5-20 Mg Caps (Amlodipine besy-benazepril  hcl) .... Takeone tablet by mouth once daily 2)  Hydrochlorothiazide 25 Mg Tabs (Hydrochlorothiazide) .... 1/2 -1 by mouth once daily 3)  Vitamin D (ergocalciferol) 50000 Unit Caps (Ergocalciferol) .... Take one tablet weekly 4)  Aspirin 81 Mg Tbec (Aspirin) .Marland Kitchen.. 1 by mouth once daily 5)  Klor-con M20 20 Meq Cr-tabs (Potassium chloride crys cr) .... Take one tablet daily 6)  Chantix Starting Month Pak 0.5 Mg X 11 & 1 Mg X 42 Tabs (Varenicline tartrate) .... As directed  Patient Instructions: 1)  Please schedule a follow-up appointment in 1 month.

## 2010-05-25 NOTE — Progress Notes (Signed)
Summary: lap band-  Phone Note Call from Patient Call back at Home Phone [phone] Call back at Work Phone [phone]   Caller: Patient Summary of Call: Patient left msg on voicemail says that surgery center performing lap band needs to have information faxed to their office.  Follow-up for Phone Call        left message on machine need to know physician information is to be faxed to .........Marland KitchenDoristine Devoid CMA  January 20, 2010 2:45 PM  pt return call, left message to call office...............Marland KitchenFelecia Deloach CMA  January 23, 2010 12:14 PM   spoke to pt, pt states that she is unsure what is needed but would like for [place] to faxed over all OV notes pertaining to lap band surgery. OV notes faxed over attn carol foushee..Pt to call if any further info needed.................Marland KitchenFelecia Deloach CMA  January 24, 2010 3:03 PM

## 2010-05-25 NOTE — Progress Notes (Signed)
Summary: did you receive from from gyn?  Phone Note Call from Patient   Caller: Patient Summary of Call: pt called left msg did you receive  form from OB/GYN?  Please give a call .Kandice Hams  July 27, 2009 4:53 PM  Initial call taken by: Kandice Hams,  July 27, 2009 4:53 PM  Follow-up for Phone Call        left message for pt that I had not received form from her OB/GYN. Army Fossa CMA  July 28, 2009 9:13 AM

## 2010-05-25 NOTE — Progress Notes (Signed)
Summary: Paperwork  Phone Note [employer] of Call: I called pt and left a voicemail informing her that all of her paperwork for [facility] was ready for her to pick up and they would be upfront. Army Fossa CMA  August 18, 2009 8:45 AM

## 2010-05-25 NOTE — Letter (Signed)
Summary: Results Follow up Letter  [facility] [place]  [address] [place], [place] [address]   Phone: [phone]  Fax: [phone]    05/24/2009 MRN: [id number]  [facility] [address] [place], [place]  [address]  Dear Ms. [facility],  The following are the results of your recent test(s):  Test         Result    Pap Smear:        Normal __X___  Not Normal _____ Comments: ______________________________________________________ Cholesterol: LDL(Bad cholesterol):         Your goal is less than:         HDL (Good cholesterol):       Your goal is more than: Comments:  ______________________________________________________ Mammogram:        Normal _____  Not Normal _____ Comments:  ___________________________________________________________________ Hemoccult:        Normal _____  Not normal _______ Comments:    _____________________________________________________________________ Other Tests:    We routinely do not discuss normal results over the telephone.  If you desire a copy of the results, or you have any questions about this information we can discuss them at your next office visit.   Sincerely,   Army Fossa CMA  May 24, 2009 8:11 AM

## 2010-05-25 NOTE — Medication Information (Signed)
Summary: Nonadherence with Amlodipine/[facility]  Nonadherence with Amlodipine/[facility]   Imported By: Lanelle Bal 07/04/2009 09:12:13  _____________________________________________________________________  External Attachment:    Type:   Image     Comment:   External Document

## 2010-05-25 NOTE — Assessment & Plan Note (Signed)
Summary: MED CHECK/[facility]   Vital Signs:  Patient profile:   40 year old female Height:      65.75 inches Weight:      246 pounds Temp:     98.1 degrees F oral Pulse rate:   82 / minute BP sitting:   130 / 86  (left arm)  Vitals Entered By: Jeremy Johann CMA (December 03, 2008 3:16 PM) CC: MED CHECK   History of Present Illness:  Hypertension follow-up      This is a 40 year old woman who presents for Hypertension follow-up.  The patient denies lightheadedness, urinary frequency, headaches, edema, impotence, rash, and fatigue.  The patient denies the following associated symptoms: chest pain, chest pressure, exercise intolerance, dyspnea, palpitations, syncope, leg edema, and pedal edema.  Compliance with medications (by patient report) has been near 100%.  The patient reports that dietary compliance has been good.  The patient reports exercising occasionally.  Adjunctive measures currently used by the patient include salt restriction.    Current Medications (verified): 1)  Lotrel 5-20 Mg Caps (Amlodipine Besy-Benazepril Hcl) .... Takeone Tablet By Mouth Once Daily 2)  Hydrochlorothiazide 25 Mg  Tabs (Hydrochlorothiazide) .... 1/2 -1 By Mouth Once Daily 3)  Astepro 137 Mcg/spray Soln (Azelastine Hcl) .... 2 Sprays Each Nostril Two Times A Day 4)  Phentermine Hcl 37.5 Mg Tabs (Phentermine Hcl) .... Take 1 Tab Once Daily  Allergies (verified): No Known Drug Allergies  Past History:  Past medical, surgical, family and social histories (including risk factors) reviewed, and no changes noted (except as noted below).  Past Medical History: Reviewed history from 10/09/2006 and no changes required. Polycystic (Lt) ovary Eczema Asthma as a child Smoker Stress card- nl-02/24/2003 Hypertension  Past Surgical History: Reviewed history from 10/09/2006 and no changes required. Tubal ligation (02/01/2005) Caesarean section (02/01/2005)  Family History: Reviewed history from 04/07/2008  and no changes required. Family History Hypertension : F, M,2 sisters Fam hx CAD: F stent Family History Kidney disease M--schizophrenia, dementia  Social History: Reviewed history from 04/07/2008 and no changes required. 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 Lungs:  Normal respiratory effort, chest expands symmetrically. Lungs are clear to auscultation, no crackles or wheezes. Heart:  Normal rate and regular rhythm. S1 and S2 normal without gallop, murmur, click, rub or other extra sounds. Extremities:  No clubbing, cyanosis, edema, or deformity noted with normal full range of motion of all joints.   Psych:  Cognition and judgment appear intact. Alert and cooperative with normal attention span and concentration. No apparent delusions, illusions, hallucinations   Impression & Recommendations:  Problem # 1:  HYPERTENSION (ICD-401.9)  Her updated medication list for this problem includes:    Lotrel 5-20 Mg Caps (Amlodipine besy-benazepril hcl) .Marland Kitchen... Takeone tablet by mouth once daily    Hydrochlorothiazide 25 Mg Tabs (Hydrochlorothiazide) .Marland Kitchen... 1/2 -1 by mouth once daily  BP today: 130/86 Prior BP: 118/62 (07/15/2008)  Prior 10 Yr Risk Heart Disease: 6 % (05/22/2007)  Labs Reviewed: K+: 3.8 (04/07/2008) Creat: : 0.8 (04/07/2008)   Chol: 155 (12/26/2007)   HDL: 35.8 (12/26/2007)   LDL: 101 (12/26/2007)   TG: 90 (12/26/2007)  Problem # 2:  MORBID OBESITY (ICD-278.01) d/w pt  diet and exercise adipex 1 by mouth once daily ---rto 1 month Ht: 65.75 (12/03/2008)   Wt: 246 (12/03/2008)   BMI: 39.03 (07/15/2008)  Problem # 3:  TOBACCO USER  (ICD-305.1)  Encouraged smoking cessation and discussed different methods for smoking cessation.   Complete Medication List: 1)  Lotrel 5-20 Mg Caps (Amlodipine besy-benazepril hcl) .... Takeone tablet by mouth once daily 2)  Hydrochlorothiazide 25 Mg Tabs (Hydrochlorothiazide) .... 1/2 -1 by mouth once daily 3)  Astepro 137 Mcg/spray Soln (Azelastine hcl) .... 2 sprays each nostril two times a day 4)  Phentermine Hcl 37.5 Mg Tabs (Phentermine hcl) .... Take 1 tab once daily Prescriptions: PHENTERMINE HCL 37.5 MG TABS (PHENTERMINE HCL) take 1 tab once daily  #30 x 0   Entered and Authorized by:   Loreen Freud DO   Signed by:   Loreen Freud DO on 12/03/2008   Method used:   Print then Give to Patient   RxID:   [id number] HYDROCHLOROTHIAZIDE 25 MG  TABS (HYDROCHLOROTHIAZIDE) 1/2 -1 by mouth once daily  #100 x 3   Entered and Authorized by:   Loreen Freud DO   Signed by:   Loreen Freud DO on 12/03/2008   Method used:   Electronically to        [facility] [phone]* (retail)       [address]       [place], [place]  [address]       Ph: [phone]       Fax: [phone]   RxID:   [id number] LOTREL 5-20 MG CAPS (AMLODIPINE BESY-BENAZEPRIL HCL) Takeone tablet by mouth once daily  #30 x 5   Entered and Authorized by:   Loreen Freud DO   Signed by:   Loreen Freud DO on 12/03/2008   Method used:   Electronically to        [facility] [phone]* (retail)       [address]       [place], [place]  [address]       Ph: [phone]       Fax: [phone]   RxID:   [id number]   Appended Document: MED CHECK/[facility] tob counseling---  5 min

## 2010-05-25 NOTE — Letter (Signed)
Summary: Generic Letter  [facility]  [address] [place], [place] [address]   Phone: [phone]  Fax: [phone]    08/17/2009  RE: KIYOMI PALLO [address] [place], [place]  [address]  To Whom It May Concern:  Ms Steele Berg has come to me interested in Lap Band surgery.  She has been to the informational meeting and has done research on the surgery.  She is aware of the Risks in having the surgery and is willing to make the necessary changes in diet and activity to be successful.  She has a history of Depression, Hyperlipidemia, Hypertension and Dysmetabolic syndrome.  She has tried Phenteramine, hydroxycut, Detox Spa, Slim Fast and other liquid protein with no results. Her current BMI in 40.98 and was as high as 45.54 in 2008.  I believe she will make an excellent candidate for bariatric surgery.  Please feel free to call with any further questions or concerns.              Sincerely,   Loreen Freud DO

## 2010-05-25 NOTE — Progress Notes (Signed)
Summary: labs-lmomx2  Phone Note Outgoing Call   Call placed by: Doristine Devoid,  May 20, 2009 4:27 PM Call placed to: Patient Summary of Call: K low--- if pt taking bananas and oj---add kcl 20 meq 1 by mouth once daily  #30   2 refills---recheck 1 month   Follow-up for Phone Call        left message on machine .........Marland KitchenDoristine Devoid  May 20, 2009 4:28 PM  left message on machine ........Marland KitchenDoristine Devoid  May 25, 2009 9:37 AM   patient never returned call will mail information.......Marland KitchenDoristine Devoid  May 27, 2009 11:45 AM     New/Updated Medications: KLOR-CON M20 20 MEQ CR-TABS (POTASSIUM CHLORIDE CRYS CR) take one tablet daily Prescriptions: KLOR-CON M20 20 MEQ CR-TABS (POTASSIUM CHLORIDE CRYS CR) take one tablet daily  #30 x 2   Entered by:   Doristine Devoid   Authorized by:   Loreen Freud DO   Signed by:   Doristine Devoid on 05/20/2009   Method used:   Electronically to        [facility] [phone]* (retail)       [address]       [place], [place]  [address]       Ph: [phone]       Fax: [phone]   RxID:   [id number]

## 2010-05-25 NOTE — Assessment & Plan Note (Signed)
Summary: appt about lapband//lch   Vital Signs:  Patient profile:   40 year old female Weight:      264 pounds Pulse rate:   88 / minute Pulse rhythm:   regular BP sitting:   126 / 82  (left arm) Cuff size:   large  Vitals Entered By: Army Fossa CMA (October 07, 2009 10:51 AM) CC: Pt here for to discuss surgery and nutritionist.    History of Present Illness: Pt here to discuss lab band surgery.  Pt saw nutritionist yesterday.  Pt is not exercising or following any diet right now.    Current Medications (verified): 1)  Lotrel 5-20 Mg Caps (Amlodipine Besy-Benazepril Hcl) .... Takeone Tablet By Mouth Once Daily 2)  Hydrochlorothiazide 25 Mg  Tabs (Hydrochlorothiazide) .... 1/2 -1 By Mouth Once Daily 3)  Vitamin D (Ergocalciferol) 50000 Unit Caps (Ergocalciferol) .... Take One Tablet Weekly 4)  Aspirin 81 Mg Tbec (Aspirin) .Marland Kitchen.. 1 By Mouth Once Daily 5)  Klor-Con M20 20 Meq Cr-Tabs (Potassium Chloride Crys Cr) .... Take One Tablet Daily 6)  Chantix Starting Month Pak 0.5 Mg X 11 & 1 Mg X 42 Tabs (Varenicline Tartrate) .... As Directed  Allergies (verified): No Known Drug Allergies  Past History:  Past medical, surgical, family and social histories (including risk factors) reviewed for relevance to current acute and chronic problems.  Past Medical History: Polycystic (Lt) ovary Eczema Asthma as a child Smoker Stress card- nl-02/24/2003 Hypertension Hyperlipidemia  Past Surgical History: Reviewed history from 10/09/2006 and no changes required. Tubal ligation (02/01/2005) Caesarean section (02/01/2005)  Family History: Reviewed history from 04/07/2008 and no changes required. Family History Hypertension : F, M,2 sisters Fam hx CAD: F stent Family History Kidney disease M--schizophrenia, dementia  Social History: Reviewed history from 04/07/2008 and no changes required. Occupation:[employer],  [employer] Current Smoker Single---dating and sexually  active with one person Alcohol use-yes Drug use-no Regular exercise-yes  Review of Systems      See HPI  Physical Exam  General:  Well-developed,well-nourished,in no acute distress; alert,appropriate and cooperative throughout examination Lungs:  Normal respiratory effort, chest expands symmetrically. Lungs are clear to auscultation, no crackles or wheezes. Heart:  normal rate and no murmur.   Psych:  Oriented X3 and normally interactive.     Impression & Recommendations:  Problem # 1:  HYPERTENSION (ICD-401.9)  Her updated medication list for this problem includes:    Lotrel 5-20 Mg Caps (Amlodipine besy-benazepril hcl) .Marland Kitchen... Takeone tablet by mouth once daily    Hydrochlorothiazide 25 Mg Tabs (Hydrochlorothiazide) .Marland Kitchen... 1/2 -1 by mouth once daily  BP today: 126/82 Prior BP: 124/80 (08/11/2009)  Prior 10 Yr Risk Heart Disease: 6 % (05/22/2007)  Labs Reviewed: K+: 4.2 (08/11/2009) Creat: : 0.64 (08/11/2009)   Chol: 145 (05/18/2009)   HDL: 37.80 (05/18/2009)   LDL: 93 (05/18/2009)   TG: 73.0 (05/18/2009)  Orders: Tobacco use cessation intermediate 3-10 minutes ([address])  Problem # 2:  HYPERLIPIDEMIA (ICD-272.4)  Labs Reviewed: SGOT: 25 (05/18/2009)   SGPT: 28 (05/18/2009)  Prior 10 Yr Risk Heart Disease: 6 % (05/22/2007)   HDL:37.80 (05/18/2009), 35.8 (12/26/2007)  LDL:93 (05/18/2009), 101 (98/02/9146)  Chol:145 (05/18/2009), 155 (12/26/2007)  Trig:73.0 (05/18/2009), 90 (12/26/2007)  Problem # 3:  MORBID OBESITY (ICD-278.01)  discussed diet and exercise Ht: 65.75 (05/18/2009)   Wt: 264 (10/07/2009)   BMI: 41.13 (05/18/2009)  Orders: Tobacco use cessation intermediate 3-10 minutes (82956)  Problem # 4:  TOBACCO USER (ICD-305.1)  Her updated medication  list for this problem includes:    Chantix Starting Month Pak 0.5 Mg X 11 & 1 Mg X 42 Tabs (Varenicline tartrate) .Marland Kitchen... As directed  Encouraged smoking cessation and discussed different methods for smoking cessation.    Orders: Tobacco use cessation intermediate 3-10 minutes ([address])  Complete Medication List: 1)  Lotrel 5-20 Mg Caps (Amlodipine besy-benazepril hcl) .... Takeone tablet by mouth once daily 2)  Hydrochlorothiazide 25 Mg Tabs (Hydrochlorothiazide) .... 1/2 -1 by mouth once daily 3)  Vitamin D (ergocalciferol) 50000 Unit Caps (Ergocalciferol) .... Take one tablet weekly 4)  Aspirin 81 Mg Tbec (Aspirin) .Marland Kitchen.. 1 by mouth once daily 5)  Klor-con M20 20 Meq Cr-tabs (Potassium chloride crys cr) .... Take one tablet daily 6)  Chantix Starting Month Pak 0.5 Mg X 11 & 1 Mg X 42 Tabs (Varenicline tartrate) .... As directed  Patient Instructions: 1)  Please schedule a follow-up appointment in 1 month.  Prescriptions: LOTREL 5-20 MG CAPS (AMLODIPINE BESY-BENAZEPRIL HCL) Takeone tablet by mouth once daily  #30 x 5   Entered and Authorized by:   Loreen Freud DO   Signed by:   Loreen Freud DO on 10/07/2009   Method used:   Electronically to        [facility] [phone]* (retail)       [address]       [place], [place]  [address]       Ph: [phone]       Fax: [phone]   RxID:   [id number] CHANTIX STARTING MONTH PAK 0.5 MG X 11 & 1 MG X 42 TABS (VARENICLINE TARTRATE) as directed  #1 x 0   Entered and Authorized by:   Loreen Freud DO   Signed by:   Loreen Freud DO on 10/07/2009   Method used:   Print then Give to Patient   RxID:   [id number]

## 2010-05-25 NOTE — Assessment & Plan Note (Signed)
Summary: CPX/PAP/NS/KDC   Vital Signs:  Patient profile:   40 year old female Height:      65.75 inches Weight:      252 pounds BMI:     41.13 Temp:     98.2 degrees F oral Pulse rate:   88 / minute Pulse rhythm:   regular BP sitting:   122 / 80  (left arm) Cuff size:   large  Vitals Entered By: Army Fossa CMA (May 18, 2009 9:01 AM)  History of Present Illness: Pt here for cpe, labs and pap.  Pt with no complaints.   Pt took Mucinex DM for 1 1/2 days for congestion and had CP with it--- CP lasted for a few hours.  CP was L side chest, and arm. She didn't take med any more.  No More SOB.  Pt was congested so she doesnt think she was more sob.   No CP since.     Preventive Screening-Counseling & Management  Alcohol-Tobacco     Alcohol drinks/day: <1     Alcohol type: beer/ liquor     Smoking Status: current     Smoking Cessation Counseling: yes     Smoke Cessation Stage: precontemplative     Packs/Day: 1.0     Year Started: 1993     Passive Smoke Exposure: yes  Caffeine-Diet-Exercise     Caffeine use/day: 0     Does Patient Exercise: no     Exercise Counseling: to improve exercise regimen  Hep-HIV-STD-Contraception     HIV Risk: no     Dental Visit-last 6 months yes     Dental Care Counseling: not indicated; dental care within six months     SBE monthly: yes     SBE Education/Counseling: to perform regular SBE  Safety-Violence-Falls     Seat Belt Use: 100      Sexual History:  currently monogamous.    Current Medications (verified): 1)  Lotrel 5-20 Mg Caps (Amlodipine Besy-Benazepril Hcl) .... Takeone Tablet By Mouth Once Daily 2)  Hydrochlorothiazide 25 Mg  Tabs (Hydrochlorothiazide) .... 1/2 -1 By Mouth Once Daily 3)  Wellbutrin Xl 300 Mg Xr24h-Tab (Bupropion Hcl) .Marland Kitchen.. 1 By Mouth Once Daily 4)  Vitamin D (Ergocalciferol) 50000 Unit Caps (Ergocalciferol) .... Take One Tablet Weekly 5)  Aspirin 81 Mg Tbec (Aspirin) .Marland Kitchen.. 1 By Mouth Once Daily  Allergies  (verified): No Known Drug Allergies  Past History:  Past Medical History: Last updated: 10/09/2006 Polycystic (Lt) ovary Eczema Asthma as a child Smoker Stress card- nl-02/24/2003 Hypertension  Past Surgical History: Last updated: 10/09/2006 Tubal ligation (02/01/2005) Caesarean section (02/01/2005)  Family History: Last updated: 04/07/2008 Family History Hypertension : F, M,2 sisters Fam hx CAD: F stent Family History Kidney disease M--schizophrenia, dementia  Social History: Last updated: 04/07/2008 Occupation:[employer],  [employer] Current Smoker Single---dating and sexually active with one person Alcohol use-yes Drug use-no Regular exercise-yes  Risk Factors: Alcohol Use: <1 (05/18/2009) Caffeine Use: 0 (05/18/2009) Exercise: no (05/18/2009)  Risk Factors: Smoking Status: current (05/18/2009) Packs/Day: 1.0 (05/18/2009) Passive Smoke Exposure: yes (05/18/2009)  Family History: Reviewed history from 04/07/2008 and no changes required. Family History Hypertension : F, M,2 sisters Fam hx CAD: F stent Family History Kidney disease M--schizophrenia, dementia  Social History: Reviewed history from 04/07/2008 and no changes required. Occupation:[employer],  [employer] Current Smoker Single---dating and sexually active with one person Alcohol use-yes Drug use-no Regular exercise-yes Does Patient Exercise:  no Dental Care w/in 6  mos.:  yes Sexual History:  currently monogamous  Review of Systems      See HPI General:  Denies chills, fatigue, fever, loss of appetite, malaise, sleep disorder, sweats, weakness, and weight loss. Eyes:  Denies blurring, discharge, double vision, eye irritation, eye pain, halos, itching, light sensitivity, red eye, vision loss-1 eye, and vision loss-both eyes; optho- q1y. ENT:  Denies decreased hearing, difficulty swallowing, ear discharge, earache, hoarseness, nasal congestion, nosebleeds, postnasal  drainage, ringing in ears, sinus pressure, and sore throat. CV:  Denies bluish discoloration of lips or nails, chest pain or discomfort, difficulty breathing at night, difficulty breathing while lying down, fainting, fatigue, leg cramps with exertion, lightheadness, near fainting, palpitations, shortness of breath with exertion, swelling of feet, swelling of hands, and weight gain. Resp:  Denies chest discomfort, chest pain with inspiration, cough, coughing up blood, excessive snoring, hypersomnolence, morning headaches, pleuritic, shortness of breath, sputum productive, and wheezing. GI:  Denies abdominal pain, bloody stools, change in bowel habits, constipation, dark tarry stools, diarrhea, excessive appetite, gas, hemorrhoids, indigestion, and loss of appetite. GU:  Denies abnormal vaginal bleeding, decreased libido, discharge, dysuria, genital sores, hematuria, incontinence, nocturia, urinary frequency, and urinary hesitancy. MS:  Denies joint pain, joint redness, joint swelling, loss of strength, low back pain, mid back pain, muscle aches, muscle , cramps, muscle weakness, stiffness, and thoracic pain. Derm:  Denies changes in color of skin, changes in nail beds, dryness, excessive perspiration, flushing, hair loss, insect bite(s), itching, lesion(s), poor wound healing, and rash. Neuro:  Denies brief paralysis, difficulty with concentration, disturbances in coordination, falling down, headaches, inability to speak, memory loss, numbness, poor balance, seizures, sensation of room spinning, tingling, tremors, visual disturbances, and weakness. Psych:  Denies alternate hallucination ( auditory/visual), anxiety, depression, easily angered, easily tearful, irritability, mental problems, panic attacks, sense of great danger, suicidal thoughts/plans, thoughts of violence, unusual visions or sounds, and thoughts /plans of harming others. Endo:  Denies cold intolerance, excessive hunger, excessive thirst,  excessive urination, heat intolerance, polyuria, and weight change. Heme:  Denies abnormal bruising, bleeding, enlarge lymph nodes, fevers, pallor, and skin discoloration. Allergy:  Denies hives or rash, itching eyes, persistent infections, seasonal allergies, and sneezing.  Physical Exam  General:  Well-developed,well-nourished,in no acute distress; alert,appropriate and cooperative throughout examination Head:  Normocephalic and atraumatic without obvious abnormalities. No apparent alopecia or balding. Eyes:  pupils equal, pupils round, pupils reactive to light, and no injection.   Ears:  External ear exam shows no significant lesions or deformities.  Otoscopic examination reveals clear canals, tympanic membranes are intact bilaterally without bulging, retraction, inflammation or discharge. Hearing is grossly normal bilaterally. Nose:  External nasal examination shows no deformity or inflammation. Nasal mucosa are pink and moist without lesions or exudates. Mouth:  Oral mucosa and oropharynx without lesions or exudates.  Teeth in good repair. Neck:  No deformities, masses, or tenderness noted. Lungs:  Normal respiratory effort, chest expands symmetrically. Lungs are clear to auscultation, no crackles or wheezes. Heart:  Normal rate and regular rhythm. S1 and S2 normal without gallop, murmur, click, rub or other extra sounds. Abdomen:  Bowel sounds positive,abdomen soft and non-tender without masses, organomegaly or hernias noted. Msk:  normal ROM, no joint tenderness, no joint swelling, no joint warmth, no redness over joints, no joint deformities, no joint instability, and no crepitation.   Pulses:  R and L carotid,radial,femoral,dorsalis pedis and posterior tibial pulses are full and equal bilaterally Extremities:  No clubbing, cyanosis, edema, or deformity noted with  normal full range of motion of all joints.   Neurologic:  No cranial nerve deficits noted. Station and gait are normal. Plantar  reflexes are down-going bilaterally. DTRs are symmetrical throughout. Sensory, motor and coordinative functions appear intact. Skin:  Intact without suspicious lesions or rashes Cervical Nodes:  No lymphadenopathy noted Axillary Nodes:  No palpable lymphadenopathy Psych:  Cognition and judgment appear intact. Alert and cooperative with normal attention span and concentration. No apparent delusions, illusions, hallucinations   Impression & Recommendations:  Problem # 1:  PREVENTIVE HEALTH CARE (ICD-V70.0)  Orders: Venipuncture ([address]) EKG w/ Interpretation ([address]) UA Dipstick w/o Micro (manual) ([address]) TLB-Lipid Panel (80061-LIPID) TLB-BMP (Basic Metabolic Panel-BMET) (80048-METABOL) TLB-Hepatic/Liver Function Pnl (80076-HEPATIC) TLB-TSH (Thyroid Stimulating Hormone) (84443-TSH) TLB-Cardiac Panel ([id number]-GNFA)  Problem # 2:  DEPRESSION (ICD-311)  Her updated medication list for this problem includes:    Wellbutrin Xl 300 Mg Xr24h-tab (Bupropion hcl) .Marland Kitchen... 1 by mouth once daily  Discussed treatment options, including trial of antidpressant medication. Will refer to behavioral health. Follow-up call in in 24-48 hours and recheck in 2 weeks, sooner as needed. Patient agrees to call if any worsening of symptoms or thoughts of doing harm arise. Verified that the patient has no suicidal ideation at this time.   Problem # 3:  TOBACCO USER (ICD-305.1)  Orders: EKG w/ Interpretation ([address])  Encouraged smoking cessation and discussed different methods for smoking cessation.   Problem # 4:  TOBACCO USER (ICD-305.1)  Orders: EKG w/ Interpretation ([address])  Encouraged smoking cessation and discussed different methods for smoking cessation.   Problem # 5:  HYPERTENSION (ICD-401.9)  Her updated medication list for this problem includes:    Lotrel 5-20 Mg Caps (Amlodipine besy-benazepril hcl) .Marland Kitchen... Takeone tablet by mouth once daily    Hydrochlorothiazide 25 Mg Tabs  (Hydrochlorothiazide) .Marland Kitchen... 1/2 -1 by mouth once daily  Orders: Venipuncture ([address]) EKG w/ Interpretation (93[address]) Echo Referral (Echo) TLB-Lipid Panel (80061-LIPID) TLB-BMP (Basic Metabolic Panel-BMET) (80048-METABOL) TLB-Hepatic/Liver Function Pnl (80076-HEPATIC) TLB-TSH (Thyroid Stimulating Hormone) (84443-TSH) TLB-Cardiac Panel ([id number])  BP today: 122/80 Prior BP: 130/86 (03/07/2009)  Prior 10 Yr Risk Heart Disease: 6 % (05/22/2007)  Labs Reviewed: K+: 3.6 (03/07/2009) Creat: : 0.6 (03/07/2009)   Chol: 155 (12/26/2007)   HDL: 35.8 (12/26/2007)   LDL: 101 (12/26/2007)   TG: 90 (12/26/2007)  Problem # 6:  OTHER AND UNSPECIFIED HYPERLIPIDEMIA (ICD-272.4)  Labs Reviewed: SGOT: 21 (03/07/2009)   SGPT: 26 (03/07/2009)  Prior 10 Yr Risk Heart Disease: 6 % (05/22/2007)   HDL:35.8 (12/26/2007), 34.1 (10/09/2006)  LDL:101 (12/26/2007), 124 (10/09/2006)  Chol:155 (12/26/2007), 184 (10/09/2006)  Trig:90 (12/26/2007), 130 (10/09/2006)  Complete Medication List: 1)  Lotrel 5-20 Mg Caps (Amlodipine besy-benazepril hcl) .... Takeone tablet by mouth once daily 2)  Hydrochlorothiazide 25 Mg Tabs (Hydrochlorothiazide) .... 1/2 -1 by mouth once daily 3)  Wellbutrin Xl 300 Mg Xr24h-tab (Bupropion hcl) .Marland Kitchen.. 1 by mouth once daily 4)  Vitamin D (ergocalciferol) 50000 Unit Caps (Ergocalciferol) .... Take one tablet weekly 5)  Aspirin 81 Mg Tbec (Aspirin) .Marland Kitchen.. 1 by mouth once daily   EKG  Procedure date:  05/18/2009  Findings:      Normal sinus rhythm with rate of:  90 bpm   Laboratory Results   Urine Tests   Date/Time Reported: May 18, 2009 12:58 PM   Routine Urinalysis   Color: yellow Appearance: Clear Glucose: negative   (Normal Range: Negative) Bilirubin: negative   (Normal Range: Negative) Ketone: negative   (Normal Range: Negative) Spec. Gravity: >=1.030   (  Normal Range: 1.003-1.035) Blood: negative   (Normal Range: Negative) pH: 5.0   (Normal Range:  5.0-8.0) Protein: negative   (Normal Range: Negative) Urobilinogen: negative   (Normal Range: 0-1) Nitrite: negative   (Normal Range: Negative) Leukocyte Esterace: negative   (Normal Range: Negative)    Comments: Floydene Flock  May 18, 2009 12:59 PM

## 2010-05-25 NOTE — Letter (Signed)
Summary: Primary Care Consult Scheduled Letter  [facility] [place]  [address] [place], [place] [address]   Phone: [phone]  Fax: [phone]      05/18/2009 MRN: [id number]  [facility] [address] [place], [place]  [address]    Dear Ms. PENN,    We have scheduled an appointment for you.  At the recommendation of Dr. Loreen Freud, we have scheduled you for an Echocardiogram with [facility] on 05-31-2009 at 4:00pm.  Their address is [address], [address] floor, [place] [address]. The office phone number is [phone].  If this appointment day and time is not convenient for you, please feel free to call the office of the doctor you are being referred to at the number listed above and reschedule the appointment.    It is important for you to keep your scheduled appointments. We are here to make sure you are given good patient care.   Thank you,    Renee, Patient Care Coordinator [place] [facility]

## 2010-05-25 NOTE — Miscellaneous (Signed)
  Clinical Lists Changes 

## 2010-05-25 NOTE — Letter (Signed)
Summary: Prenatal  & Health History Records/Charles Delcambre MD  Prenatal  & Health History Records/Charles Delcambre MD   Imported By: Lanelle Bal 08/03/2009 09:47:09  _____________________________________________________________________  External Attachment:    Type:   Image     Comment:   External Document

## 2010-05-25 NOTE — Assessment & Plan Note (Signed)
Summary: 1 MONTH OV//PH   Vital Signs:  Patient profile:   40 year old female Weight:      272.0 pounds BMI:     45.43 Temp:     98.5 degrees F oral Pulse rate:   88 / minute Pulse rhythm:   regular BP sitting:   128 / 88  (left arm) Cuff size:   large  Vitals Entered By: Almeta Monas CMA Duncan Dull) (January 13, 2010 3:17 PM) CC: 1 mo f/u    History of Present Illness: Pt here for weight check --- pt is struggling with weight loss since school started.  Pt quit smoking!!     Preventive Screening-Counseling & Management  Alcohol-Tobacco     Smoking Status: quit  Current Medications (verified): 1)  Lotrel 5-20 Mg Caps (Amlodipine Besy-Benazepril Hcl) .... Takeone Tablet By Mouth Once Daily 2)  Hydrochlorothiazide 25 Mg  Tabs (Hydrochlorothiazide) .... 1/2 -1 By Mouth Once Daily 3)  Aspirin 81 Mg Tbec (Aspirin) .Marland Kitchen.. 1 By Mouth Once Daily 4)  Chantix Continuing Month Pak 1 Mg Tabs (Varenicline Tartrate) .... As Directed  Allergies (verified): No Known Drug Allergies  Past History:  Past medical, surgical, family and social histories (including risk factors) reviewed for relevance to current acute and chronic problems.  Past Medical History: Reviewed history from 10/07/2009 and no changes required. Polycystic (Lt) ovary Eczema Asthma as a child Smoker Stress card- nl-02/24/2003 Hypertension Hyperlipidemia  Past Surgical History: Reviewed history from 10/09/2006 and no changes required. Tubal ligation (02/01/2005) Caesarean section (02/01/2005)  Family History: Reviewed history from 04/07/2008 and no changes required. Family History Hypertension : F, M,2 sisters Fam hx CAD: F stent Family History Kidney disease M--schizophrenia, dementia  Social History: Reviewed history from 04/07/2008 and no changes required. Occupation:[employer],  [employer] Single---dating and sexually active with one person Alcohol use-yes Drug use-no Regular  exercise-yes Former Smoker Smoking Status:  quit  Review of Systems      See HPI  Physical Exam  General:  Well-developed,well-nourished,in no acute distress; alert,appropriate and cooperative throughout examination Lungs:  Normal respiratory effort, chest expands symmetrically. Lungs are clear to auscultation, no crackles or wheezes. Heart:  normal rate and no murmur.   Skin:  Intact without suspicious lesions or rashes Psych:  Oriented X3 and normally interactive.     Impression & Recommendations:  Problem # 1:  MORBID OBESITY (ICD-278.01) con't diet and exercise ---  pt seeing nutritionist rto 1 month Ht: 65 (12/09/2009)   Wt: 272.0 (01/13/2010)   BMI: 45.43 (01/13/2010)  Problem # 2:  TOBACCO USE, QUIT (ICD-V15.82) Assessment: Improved con't chantix  Complete Medication List: 1)  Lotrel 5-20 Mg Caps (Amlodipine besy-benazepril hcl) .... Takeone tablet by mouth once daily 2)  Hydrochlorothiazide 25 Mg Tabs (Hydrochlorothiazide) .... 1/2 -1 by mouth once daily 3)  Aspirin 81 Mg Tbec (Aspirin) .Marland Kitchen.. 1 by mouth once daily 4)  Chantix Continuing Month Pak 1 Mg Tabs (Varenicline tartrate) .... As directed  Patient Instructions: 1)  rto 1 month Prescriptions: CHANTIX CONTINUING MONTH PAK 1 MG TABS (VARENICLINE TARTRATE) as directed  #1 x 2   Entered and Authorized by:   Loreen Freud DO   Signed by:   Loreen Freud DO on 01/13/2010   Method used:   Electronically to        [facility] [address] [phone]* (retail)       [address]       [place], [place]  [address]  Ph: [phone]       Fax: [phone]   RxID:   [id number]

## 2010-05-25 NOTE — Progress Notes (Signed)
Summary: Weights  Phone Note Outgoing Call   Summary of Call: Called and left message for pt- need form she filled out of all the diets that she has tried. I also need a weight from a dr's office for 2007. LMTCB. Army Fossa CMA  June 13, 2009 4:59 PM   Follow-up for Phone Call        [facility]. Army Fossa CMA  June 14, 2009 8:37 AM   Additional Follow-up for Phone Call Additional follow up Details #1::        Pt is aware. She will drop form off. Army Fossa CMA  June 14, 2009 9:14 AM

## 2010-05-25 NOTE — Assessment & Plan Note (Signed)
Summary: rto 1 month/cbs   Vital Signs:  Patient profile:   40 year old female Weight:      252 pounds Pulse rate:   87 / minute Pulse rhythm:   regular BP sitting:   124 / 80  (left arm) Cuff size:   large  Vitals Entered By: Army Fossa CMA (August 11, 2009 3:05 PM) CC: Pt here follow up on Klor con- fill out paperwork.   History of Present Illness: Pt here to recheck potassium.  No complaints.     Pt also brought paperwork for lap band.    Current Medications (verified): 1)  Lotrel 5-20 Mg Caps (Amlodipine Besy-Benazepril Hcl) .... Takeone Tablet By Mouth Once Daily 2)  Hydrochlorothiazide 25 Mg  Tabs (Hydrochlorothiazide) .... 1/2 -1 By Mouth Once Daily 3)  Vitamin D (Ergocalciferol) 50000 Unit Caps (Ergocalciferol) .... Take One Tablet Weekly 4)  Aspirin 81 Mg Tbec (Aspirin) .Marland Kitchen.. 1 By Mouth Once Daily 5)  Klor-Con M20 20 Meq Cr-Tabs (Potassium Chloride Crys Cr) .... Take One Tablet Daily  Allergies (verified): No Known Drug Allergies  Past History:  Past medical, surgical, family and social histories (including risk factors) reviewed for relevance to current acute and chronic problems.  Past Medical History: Reviewed history from 10/09/2006 and no changes required. Polycystic (Lt) ovary Eczema Asthma as a child Smoker Stress card- nl-02/24/2003 Hypertension  Past Surgical History: Reviewed history from 10/09/2006 and no changes required. Tubal ligation (02/01/2005) Caesarean section (02/01/2005)  Family History: Reviewed history from 04/07/2008 and no changes required. Family History Hypertension : F, M,2 sisters Fam hx CAD: F stent Family History Kidney disease M--schizophrenia, dementia  Social History: Reviewed history from 04/07/2008 and no changes required. Occupation:[employer],  [employer] release de[employer] Current Smoker Single---dating and sexually active with one person Alcohol use-yes Drug use-no Regular exercise-yes  Review of Systems  See HPI  Physical Exam  General:  Well-developed,well-nourished,in no acute distress; alert,appropriate and cooperative throughout examination Lungs:  Normal respiratory effort, chest expands symmetrically. Lungs are clear to auscultation, no crackles or wheezes. Heart:  Normal rate and regular rhythm. S1 and S2 normal without gallop, murmur, click, rub or other extra sounds. Extremities:  1+ left pedal edema, left pretibial edema, 1+ right pedal edema, and right pretibial edema.   Skin:  Intact without suspicious lesions or rashes Psych:  Cognition and judgment appear intact. Alert and cooperative with normal attention span and concentration. No apparent delusions, illusions, hallucinations   Impression & Recommendations:  Problem # 1:  HYPOKALEMIA (ICD-276.8) con't Klorcon  Orders: Venipuncture ([address])  Problem # 2:  HYPERTENSION (ICD-401.9)  Her updated medication list for this problem includes:    Lotrel 5-20 Mg Caps (Amlodipine besy-benazepril hcl) .Marland Kitchen... Takeone tablet by mouth once daily    Hydrochlorothiazide 25 Mg Tabs (Hydrochlorothiazide) .Marland Kitchen... 1/2 -1 by mouth once daily  BP today: 124/80 Prior BP: 120/80 (07/01/2009)  Prior 10 Yr Risk Heart Disease: 6 % (05/22/2007)  Labs Reviewed: K+: 3.6 (05/18/2009) Creat: : 0.6 (05/18/2009)   Chol: 145 (05/18/2009)   HDL: 37.80 (05/18/2009)   LDL: 93 (05/18/2009)   TG: 73.0 (05/18/2009)  Complete Medication List: 1)  Lotrel 5-20 Mg Caps (Amlodipine besy-benazepril hcl) .... Takeone tablet by mouth once daily 2)  Hydrochlorothiazide 25 Mg Tabs (Hydrochlorothiazide) .... 1/2 -1 by mouth once daily 3)  Vitamin D (ergocalciferol) 50000 Unit Caps (Ergocalciferol) .... Take one tablet weekly 4)  Aspirin 81 Mg Tbec (Aspirin) .Marland Kitchen.. 1 by mouth once daily 5)  Klor-con  M20 20 Meq Cr-tabs (Potassium chloride crys cr) .... Take one tablet daily  Patient Instructions: 1)  rto 3 months

## 2010-05-31 ENCOUNTER — Encounter: Payer: Self-pay | Admitting: Family Medicine

## 2010-06-14 NOTE — Medication Information (Signed)
Summary: Medication Non-Adherence /Amlodipine-Benazepril  Medication Non-Adherence /Amlodipine-Benazepril   Imported By: Maryln Gottron 06/05/2010 12:32:22  _____________________________________________________________________  External Attachment:    Type:   Image     Comment:   External Document

## 2010-08-14 ENCOUNTER — Other Ambulatory Visit: Payer: Self-pay | Admitting: Family Medicine

## 2010-09-02 ENCOUNTER — Encounter: Payer: Self-pay | Admitting: Family Medicine

## 2010-09-04 ENCOUNTER — Other Ambulatory Visit (HOSPITAL_COMMUNITY)
Admission: RE | Admit: 2010-09-04 | Discharge: 2010-09-04 | Disposition: A | Discharge status: Home / Self Care | Payer: Private Health Insurance - Indemnity | Source: Ambulatory Visit | Attending: Family Medicine | Admitting: Family Medicine

## 2010-09-04 ENCOUNTER — Encounter: Payer: Self-pay | Admitting: Family Medicine

## 2010-09-04 ENCOUNTER — Ambulatory Visit (INDEPENDENT_AMBULATORY_CARE_PROVIDER_SITE_OTHER): Payer: Private Health Insurance - Indemnity | Admitting: Family Medicine

## 2010-09-04 VITALS — BP 120/70 | HR 94 | Temp 98.2°F | Ht 66.0 in | Wt 280.2 lb

## 2010-09-04 DIAGNOSIS — Z01419 Encounter for gynecological examination (general) (routine) without abnormal findings: Secondary | ICD-10-CM | POA: Insufficient documentation

## 2010-09-04 DIAGNOSIS — F17200 Nicotine dependence, unspecified, uncomplicated: Secondary | ICD-10-CM

## 2010-09-04 DIAGNOSIS — Z720 Tobacco use: Secondary | ICD-10-CM

## 2010-09-04 DIAGNOSIS — Z0000 Encounter for general adult medical examination without abnormal findings: Secondary | ICD-10-CM

## 2010-09-04 DIAGNOSIS — I10 Essential (primary) hypertension: Secondary | ICD-10-CM

## 2010-09-04 DIAGNOSIS — M722 Plantar fascial fibromatosis: Secondary | ICD-10-CM

## 2010-09-04 LAB — CBC WITH DIFFERENTIAL/PLATELET
Basophils Absolute: 0 10*3/uL (ref 0.0–0.1)
Basophils Relative: 0.4 % (ref 0.0–3.0)
Eosinophils Absolute: 0.2 10*3/uL (ref 0.0–0.7)
Eosinophils Relative: 3.1 % (ref 0.0–5.0)
HCT: 39 % (ref 36.0–46.0)
Hemoglobin: 13.5 g/dL (ref 12.0–15.0)
Lymphocytes Relative: 43.1 % (ref 12.0–46.0)
Lymphs Abs: 3.4 10*3/uL (ref 0.7–4.0)
MCHC: 34.6 g/dL (ref 30.0–36.0)
MCV: 96.7 fl (ref 78.0–100.0)
Monocytes Absolute: 0.5 10*3/uL (ref 0.1–1.0)
Monocytes Relative: 6.4 % (ref 3.0–12.0)
Neutro Abs: 3.7 10*3/uL (ref 1.4–7.7)
Neutrophils Relative %: 47 % (ref 43.0–77.0)
Platelets: 244 10*3/uL (ref 150.0–400.0)
RBC: 4.03 Mil/uL (ref 3.87–5.11)
RDW: 13.7 % (ref 11.5–14.6)
WBC: 7.9 10*3/uL (ref 4.5–10.5)

## 2010-09-04 LAB — LIPID PANEL
Cholesterol: 179 mg/dL (ref 0–200)
HDL: 39.6 mg/dL (ref >39.00)
LDL Cholesterol: 118 mg/dL — ABNORMAL HIGH (ref 0–99)
Total CHOL/HDL Ratio: 5
Triglycerides: 106 mg/dL (ref 0.0–149.0)
VLDL: 21.2 mg/dL (ref 0.0–40.0)

## 2010-09-04 LAB — HEPATIC FUNCTION PANEL
ALT: 25 U/L (ref 0–35)
AST: 22 U/L (ref 0–37)
Albumin: 3.8 g/dL (ref 3.5–5.2)
Alkaline Phosphatase: 55 U/L (ref 39–117)
Bilirubin, Direct: 0 mg/dL (ref 0.0–0.3)
Total Bilirubin: 0.6 mg/dL (ref 0.3–1.2)
Total Protein: 6.9 g/dL (ref 6.0–8.3)

## 2010-09-04 LAB — BASIC METABOLIC PANEL
BUN: 12 mg/dL (ref 6–23)
CO2: 27 mEq/L (ref 19–32)
Calcium: 9.4 mg/dL (ref 8.4–10.5)
Chloride: 99 mEq/L (ref 96–112)
Creatinine, Ser: 0.7 mg/dL (ref 0.4–1.2)
GFR: 119.3 mL/min (ref >60.00)
Glucose, Bld: 81 mg/dL (ref 70–99)
Potassium: 4.3 mEq/L (ref 3.5–5.1)
Sodium: 134 mEq/L — ABNORMAL LOW (ref 135–145)

## 2010-09-04 LAB — TSH: TSH: 1.6 u[IU]/mL (ref 0.35–5.50)

## 2010-09-04 MED ORDER — TETANUS-DIPHTH-ACELL PERTUSSIS 5-2.5-18.5 LF-MCG/0.5 IM SUSP
0.5000 mL | Freq: Once | INTRAMUSCULAR | Status: AC
  Administered 2010-09-04: 0.5 mL via INTRAMUSCULAR

## 2010-09-04 MED ORDER — HYDROCHLOROTHIAZIDE 25 MG PO TABS: 25.0000 mg | ORAL_TABLET | Freq: Every day | ORAL | Status: DC

## 2010-09-04 MED ORDER — AMLODIPINE BESY-BENAZEPRIL HCL 5-20 MG PO CAPS: ORAL_CAPSULE | ORAL | Status: DC

## 2010-09-04 MED ORDER — VARENICLINE TARTRATE 0.5 MG X 11 & 1 MG X 42 PO MISC: ORAL | Status: AC

## 2010-09-04 NOTE — Assessment & Plan Note (Signed)
con't meds stable 

## 2010-09-04 NOTE — Progress Notes (Signed)
Subjective:     Makayla Good is a 40 y.o. female and is here for a comprehensive physical exam. The patient reports trouble with pain in feet.  They hurt when she first gets out of bed and then the pain eases up.  After sitting and getting back up the pain starts all over.    History   Social History  . Marital Status: Single    Spouse Name: N/A    Number of Children: N/A  . Years of Education: 13   Occupational History  . release [employer]   Social History Main Topics  . Smoking status: Current Everyday Smoker -- 0.5 packs/day for 7.5 years    Types: Cigarettes  . Smokeless tobacco: Not on file   Comment: smokeless cigarettes--would like to try chantix   . Alcohol Use: Yes  . Drug Use: No  . Sexually Active: Yes -- Female partner(s)     been with same person over 20 years   Other Topics Concern  . Not on file   Social History Narrative  . No narrative on file   Health Maintenance  Topic Date Due  . Pap Smear  11/07/1988  . Tetanus/tdap  11/07/1989    The following portions of the patient's history were reviewed and updated as appropriate: allergies, current medications, past family history, past medical history, past social history, past surgical history and problem list.  Review of Systems Review of Systems  Constitutional: Negative for activity change, appetite change and fatigue.  HENT: Negative for hearing loss, congestion, tinnitus and ear discharge.  dentist due Eyes: Negative for visual disturbance (see optho q1y -- vision corrected to 20/20 with glasses).  Respiratory: Negative for cough, chest tightness and shortness of breath.   Cardiovascular: Negative for chest pain, palpitations and leg swelling.  Gastrointestinal: Negative for abdominal pain, diarrhea, constipation and abdominal distention.  Genitourinary: Negative for urgency, frequency, decreased urine volume and difficulty urinating.  Musculoskeletal: Negative for back pain, arthralgias  and gait problem.  Skin: Negative for color change, pallor and rash.  Neurological: Negative for dizziness, light-headedness, numbness and headaches.  Hematological: Negative for adenopathy. Does not bruise/bleed easily.  Psychiatric/Behavioral: Negative for suicidal ideas, confusion, sleep disturbance, self-injury, dysphoric mood, decreased concentration and agitation.     Objective:    BP 120/70  Pulse 94  Temp(Src) 98.2 F (36.8 C) (Oral)  Ht 5\' 6"  (1.676 m)  Wt 280 lb 3.2 oz (127.098 kg)  BMI 45.23 kg/m2  SpO2 98%  LMP 08/28/2010 General appearance: alert, cooperative, appears stated age and no distress Head: Normocephalic, without obvious abnormality, atraumatic Eyes: conjunctivae/corneas clear. PERRL, EOM's intact. Fundi benign. Ears: normal TM's and external ear canals both ears Nose: Nares normal. Septum midline. Mucosa normal. No drainage or sinus tenderness. Throat: lips, mucosa, and tongue normal; teeth and gums normal Neck: no adenopathy, no carotid bruit, no JVD, supple, symmetrical, trachea midline and thyroid not enlarged, symmetric, no tenderness/mass/nodules Lungs: clear to auscultation bilaterally Breasts: normal appearance, no masses or tenderness Heart: regular rate and rhythm, S1, S2 normal, no murmur, click, rub or gallop Abdomen: soft, non-tender; bowel sounds normal; no masses,  no organomegaly Pelvic: cervix normal in appearance, external genitalia normal, no adnexal masses or tenderness, no cervical motion tenderness, rectovaginal septum normal, uterus normal size, shape, and consistency and vagina normal without discharge Extremities: extremities normal, atraumatic, no cyanosis or edema Pulses: 2+ and symmetric Skin: Skin color, texture, turgor normal. No rashes or lesions Lymph nodes: Cervical,  supraclavicular, and axillary nodes normal. Neurologic: Grossly normal    Assessment:    Healthy female exam.      Plan:     See After Visit Summary for  Counseling Recommendations

## 2010-09-04 NOTE — Assessment & Plan Note (Signed)
Gel inserts Sneakers with good support Stretch in am before getting out of bed

## 2010-09-04 NOTE — Patient Instructions (Signed)
Plantar Fasciitis (Heel Spur Syndrome) with Rehab   The plantar fascia is a fibrous, ligament-like, soft-tissue structure that spans the bottom of the foot. Plantar fasciitis is a condition that causes pain in the foot due to inflammation of the tissue.  SYMPTOMS  Pain and tenderness on the underneath side of the foot.  Pain that worsens with standing or walking.   CAUSES Plantar fasciitis is caused by irritation and injury to the plantar fascia on the underneath side of the foot. Common mechanisms of injury include:  Direct trauma to bottom of the foot.  Damage to a small nerve that runs under the foot where the main fascia attaches to the heel bone.  Stress placed on the plantar fascia due to bone spurs.   RISK INCREASES WITH:   Activities that place stress on the plantar fascia (running, jumping, pivoting, or cutting).  Poor strength and flexibility.  Improperly fitted shoes.  Tight calf muscles.  Flat feet.  Failure to warm-up properly before activity.  Obesity.   PREVENTIVE MEASURES   Warm up and stretch properly before activity.  Allow for adequate recovery between workouts.  Maintain physical fitness: l Strength, flexibility, and endurance. l Cardiovascular fitness.  Maintain a health body weight.  Avoid stress on the plantar fascia.  Wear properly fitted shoes, including arch supports for individuals who have flat feet.   PROGNOSIS If treated properly, then the symptoms of plantar fasciitis usually resolve without surgery.  However, occasionally surgery is necessary.   POSSIBLE COMPLICATIONS   Recurrent symptoms that may result in a chronic condition.  Problems of the lower back that are caused by compensating for the injury, such as limping.  Pain or weakness of the foot during push-off following surgery.  Chronic inflammation, scarring, and partial or complete fascia tear, occurring more often from repeated injections.   GENERAL TREATMENT  CONSIDERATIONS  Treatment initially involves the use of ice and medication to help reduce pain and inflammation. The use of strengthening and stretching exercises may help reduce pain with activity, especially stretches of the Achilles tendon. These exercises may be performed at home or with a therapist. Your caregiver may recommend that you use heel cups of arch supports to help reduce stress on the plantar fascia. Occasionally, corticosteroid injections are given to reduce inflammation. If symptoms persist for greater than 6 months despite non-surgical  (conservative), then surgery may be recommended.    MEDICATION   If pain medication is necessary, then nonsteroidal anti-inflammatory medications, such as aspirin and ibuprofen, or other minor pain relievers, such as acetaminophen, are often recommended.   Do not take pain medication within 7 days before surgery.   Prescription pain relievers may be given if deemed necessary by your caregiver. Use only as directed and only as much as you need.  Corticosteroid injections may be given by your caregiver. These injections should be reserved for the most serious cases, because they may only be given a certain number of times.   HEAT AND COLD   Cold treatment (icing) relieves pain and reduces inflammation. Cold treatment should be applied for 10 to 15 minutes every 2 to 3 hours for inflammation and pain and immediately after any activity that aggravates your symptoms. Use ice packs or massage the area with a piece of ice (ice massage).  Heat treatment may be used prior to performing the stretching and strengthening activities prescribed by your caregiver, physical therapist, or athletic trainer. Use a heat pack or soak the injury in warm water.     SEEK TREATMENT IF:  Treatment seems to offer no benefit, or the condition worsens.  Any medications produce adverse side effects.   EXERCISES   RANGE OF MOTION AND STRETCHING EXERCISES - Plantar  Fasciitis (Heel Spur Syndrome) These exercises may help you when beginning to rehabilitate your injury. Your symptoms may resolve with or without further involvement from your physician, physical therapist or athletic trainer. While completing these exercises, remember:     Restoring tissue flexibility helps normal motion to return to the joints. This allows healthier, less painful movement and activity.  An effective stretch should be held for at least 30 seconds.  A stretch should never be painful. You should only feel a gentle lengthening or release in the stretched tissue.     RANGE OF MOTION - Toe Extension, Flexion  Sit with your __________ leg crossed over your opposite knee.  Grasp your toes and gently pull them back toward the top of your foot. You should feel a stretch on the bottom of your toes and/or foot.    Hold this stretch for __________ seconds.   Now, gently pull your toes toward the bottom of your foot. You should feel a stretch on the top of your toes and or foot.  Hold this stretch for __________ seconds.   Repeat __________ times. Complete this stretch __________ times per day.      RANGE OF MOTION - Ankle Dorsiflexion, Active Assisted   Remove shoes and sit on a chair that is preferably not on a carpeted surface.    Place __________ foot under knee. Extend your opposite leg for support.  Keeping your heel down, slide your __________ foot back toward the chair until you feel a stretch at your ankle or calf. If you do not feel a stretch, slide your bottom forward to the edge of the chair, while still keeping your heel down.  Hold this stretch for __________ seconds.  Repeat __________ times. Complete this stretch __________ times per day.     STRETCH - Gastroc, Standing   Place hands on wall.  Extend __________ leg, keeping the front knee somewhat bent.  Slightly point your toes inward on your back foot.  Keeping your __________ heel on the floor and  your knee straight, shift your weight toward the wall, not allowing your back to arch.  You should feel a gentle stretch in the __________ calf. Hold this position for __________ seconds. Repeat __________ times. Complete this stretch __________ times per day.    STRETCH - Soleus, Standing   Place hands on wall.  Extend __________ leg, keeping the other knee somewhat bent.  Slightly point your toes inward on your back foot.  Keep your __________ heel on the floor, bend your back knee, and slightly shift your weight over the back leg so that you feel a gentle stretch deep in your back calf.   Hold this position for __________ seconds. Repeat __________ times. Complete this stretch __________ times per day.     STRETCH - Gastrocsoleus, Standing  Note: This exercise can place a lot of stress on your foot and ankle. Please complete this exercise only if specifically instructed by your caregiver.   Place the ball of your __________ foot on a step, keeping your other foot firmly on the same step.   Hold on to the wall or a rail for balance.  Slowly lift your other foot, allowing your body weight to press your heel down over the edge of the step.  You   should feel a stretch in your __________ calf.  Hold this position for __________ seconds.  Repeat this exercise with a slight bend in your __________ knee.  Repeat __________ times. Complete this stretch __________ times per day.      STRENGTHENING EXERCISES - Plantar Fasciitis (Heel Spur Syndrome)  These exercises may help you when beginning to rehabilitate your injury. They may resolve your symptoms with or without further involvement from your physician, physical therapist or athletic trainer. While completing these exercises, remember:   Muscles can gain both the endurance and the strength needed for everyday activities through controlled exercises.  Complete these exercises as instructed by your physician, physical therapist or  athletic trainer.  Progress the resistance and repetitions only as guided.    STRENGTH - Towel Curls  Sit in a chair positioned on a non-carpeted surface.    Place your foot on a towel, keeping your heel on the floor.  Pull the towel toward your heel by only curling your toes. Keep your heel on the floor.  If instructed by your physician, physical therapist or athletic trainer, add ____________________ at the end of the towel. Repeat __________ times. Complete this exercise __________ times per day.     STRENGTH - Ankle Inversion   Secure one end of a rubber exercise band/tubing to a fixed object (table, pole). Loop the other end around your foot just before your toes.  Place your fists between your knees. This will focus your strengthening at your ankle.  Slowly, pull your big toe up and in, making sure the band/tubing is positioned to resist the entire motion.  Hold this position for __________ seconds.   Have your muscles resist the band/tubing as it slowly pulls your foot back to the starting position.  Repeat __________ times. Complete this exercises __________ times per day.    Document Released: 04/09/2005  Document Re-Released: 05/01/2009 [employer] Patient Information 2011 [employer]. 

## 2010-09-04 NOTE — Assessment & Plan Note (Signed)
chantix starting pack

## 2010-09-08 ENCOUNTER — Encounter: Payer: Self-pay | Admitting: *Deleted

## 2010-09-08 NOTE — Discharge Summary (Signed)
Makayla Good, Makayla Good               ACCOUNT NO.:  [id number]   MEDICAL RECORD NO.:  [id number]          PATIENT TYPE:  INP   LOCATION:  9113                          FACILITY:  [facility]   PHYSICIAN:  Charles A. Delcambre, MDDATE OF BIRTH:  [birth year]   DATE OF ADMISSION:  02/01/2005  DATE OF DISCHARGE:  02/03/2005                                 DISCHARGE SUMMARY   PRIMARY DISCHARGE DIAGNOSES:  1.  Previous Cesarean section with term pregnancy.  2.  Undesired fertility.   POSTOPERATIVE DIAGNOSIS:  1.  Previous Cesarean section with term pregnancy.  2.  Undesired fertility.   PROCEDURE:  1.  Repeat low transverse Cesarean section.  2.  Left tubal ligation, noting at delivery procedure previous right      salpingo-oophorectomy verified with patient, previously undisclosed.   DISPOSITION:  The patient was discharged home to follow up in the office in  3 days to discontinue staples.  She was given convalescence instructions and  prescription for Percocet 5/325 one to two p.o. q.4h.  PRN, #40.  Patient is  instructed to take iron once a day over the counter.  She was instructed to  call if any temperature over 100 degrees, increased pain or bleeding,  erythema or drainage about the wound, fever or chills.  No lifting greater  than 25 pounds for one month.  No sexual activity with penetration of the  vagina for six weeks.  No driving for two weeks.  Convalesce at home.  Showers okay and bathing after two weeks okay.   OPERATIVE FINDINGS:  Vigorous female, 4125 grams, 48 cm in length, Apgar's 9  and 9.   LABORATORY DATA:  Postoperative hematocrit 32.4, hemoglobin 10.7.   HOSPITAL COURSE:  The patient was admitted and underwent surgery as noted  above.  Tube pathology is not on the chart at this time but as I recall was  a complete cross section of single tube which would be the left tube  specimen.  Postoperatively she had good control with pain medication  Duramorph.  Her Foley catheter  was discontinued on postoperative day #1.  Aldomet was restarted at 500 mg b.i.d. for chronic hypertension.  She  continued to do well.  Catheter was discontinued on postoperative day #1 and  she ambulated without difficulty.  On day #2 she continued to do well.  Diet  was tolerated.  She voided without difficulty.  Day #3 she was continuing to  do well and was therefore discharged home with follow up as noted above.      Charles A. Sydnee Cabal, MD  Electronically Signed    CAD/MEDQ  D:  02/27/2005  T:  02/27/2005  Job:  [id number]

## 2010-09-08 NOTE — Op Note (Signed)
Makayla Good, Makayla Good               ACCOUNT NO.:  [id number]   MEDICAL RECORD NO.:  [id number]          PATIENT TYPE:  INP   LOCATION:  9113                          [facility]ACILITY:  [facility]   PHYSICIAN:  Charles A. Delcambre, MDDATE OF BIRTH:  1970-10-27   DATE OF PROCEDURE:  02/01/2005  DATE OF DISCHARGE:                                 OPERATIVE REPORT   PREOPERATIVE DIAGNOSES:  1.  Previous cesarean section.  2.  Undesired fertility.   POSTOPERATIVE DIAGNOSES:  1.  Previous cesarean section.  2.  Undesired fertility.   PROCEDURES:  1.  Repeat low transverse cesarean section.  2.  Left tubal ligation, modified Pomeroy type, noting a previous right      salpingo-oophorectomy.   FINDING AT SURGERY:  Consistent with history.   SURGEON:  Charles A. Sydnee Cabal, M.D.   ASSISTANT:  Gerald Leitz, M.D.   COMPLICATIONS:  None.   ESTIMATED BLOOD LOSS:  600 mL.   Instrument, sponge and needle count correct x2.   ANESTHESIA:  Spinal and epidural.   PROCEDURE:  The patient was taken to the operating room and placed in the  supine position after spinal and epidural were placed.  Anesthesia was  adequate.  A sterile prep and drape was undertaken and a Pfannenstiel  incision was made with a knife, carried down to the fascia, and the fascia  was incised with knife and Mayo scissors.  The rectus sheath was released  superiorly and inferiorly sharply.  The rectus muscles were sharply  dissected in the midline, the bladder blade was placed, vesicouterine  peritoneum was incised with the Metzenbaum scissors and blunt dissection was  used to develop a bladder flap.  The bladder blade was replaced and the  uterine incision was made transverse in the lower uterine segment to  amniotomy without injuring the baby.  Traction was used to extend the  incision.  A hand was inserted.  Vacuum assistance was used to assist in  delivery secondary to the occiput remaining high in unstable position.  Infant was  delivered with fundal pressure by the operator's assistant  without difficulty thereafter.  The cord was clamped and the infant was  handed off, shown to the mother and father, and handed off to Dr. Alison Murray,  who was in attendance.  The placenta was manually expressed.  The placenta  was sent to pathology.  The uterus was externalized for repair, wiped with a  moistened lap and a small extension anteriorly was closed with #1 chromic  with running locking suture.  The remainder of the uterine incision was  closed with #1 chromic running locking suture.  Two figure-of-eight sutures  were used of #1 chromic and a single figure-of-eight with 2-0 Vicryl were  used to achieve hemostasis and hemostasis was excellent.  The tubal ligation  was then undertaken with the middle segment of left tube doubly ligated with  0 plain gut and excised.  Hemostasis of the pedicle was excellent.  The  uterus was reinternalized.  Pedicles of the tubal ligation were then  visualized and with excellent hemostasis and was intact.  Uterine incision  hemostasis was excellent.  Irrigation was carried out.  Rectus muscles were  plicated in the midline with #1 chromic in one single stitch to achieve  plication of the muscles somewhat between diastasis.  Subfascial hemostasis  was excellent.  The fascia was closed with #1 Vicryl running nonlocking  suture, subcutaneous hemostasis was excellent after minor electrocautery.  Irrigation was carried out.  Sterile skin clips were used to close the skin  and a sterile dressing was applied.  The patient was taken to the recovery  room with physician in attendance, having tolerated the procedure well.      Charles A. Sydnee Cabal, MD  Electronically Signed     CAD/MEDQ  D:  02/01/2005  T:  02/01/2005  Job:  [id number]

## 2010-09-08 NOTE — H&P (Signed)
[facility]  Patient:    Makayla Good, Makayla Good                        MRN: [id number] Adm. Date:  [id number] Attending:  Tonye Royalty                         History and Physical  CHIEF COMPLAINT:              Premature rupture of membranes.  HISTORY OF PRESENT ILLNESS:   The patient is a 40 year old, Gravida 3, Para 0, Abortus 2, last menstrual period reported being January 01, 1999. Estimated date of confinement October 07, 1999.  The patient currently 37-1/2 weeks estimated gestational age presented to the emergency room at [facility] this morning stating that approximately around midnight she experienced some gushing of fluid per vagina, presented to the emergency room this morning and her thighs were moist.  Her fern slide test was positive.  Her cervix was fingertip and long.  Presentation ballotable. Bedside ultrasound was done to confirm presentation due to the patients obesity and confirm that the fetus was in the vertex presentation.  Patient with history in the past before pregnancy of being hypertension. Currently on no hypertensive agent and has been watched carefully through her pregnancy.  Her blood pressures when she came in initially were 151/100 and on repeat were as follows 147/57, 128/72, 131/72 and 146/69.  She was not having any headaches. No visual disturbances and no right upper quadrant pain and not any contractions since yesterday afternoon.  She was placed on the monitor.  No contractions were evident. Fetal heart rate tracing was reactive.  PRENATAL COURSE:              The patient being A negative received RhoGAM at [redacted] weeks gestation.  She has had history of asthma and currently on no medication.  She has suffered from irritable bowel syndrome.  Screening ultrasound had demonstrated that the patient had some uterine fibroids besides her obesity.  She has had basically an uneventful prenatal course and she has been  normotensive. Her GBS cultures were recent negative in the office.  PAST MEDICAL HISTORY:         History of asthma, obesity, irritable bowel syndrome, and chronic hypertension.  She has had two spontaneous abortions in 1992 and 2000 resulting in dilatation and curettages.  She has had a left oophorectomy in 1991.  Herniated disc in 1996.  Pneumonia at the age of 23. She also had GC and Chlamydia in the past as well.  PHYSICAL EXAMINATION:  VITAL SIGNS:                  Blood pressure readings as mentioned above.  HEENT:                        Unremarkable.  NECK:                         Supple. Trachea midline.  No carotid bruits or thyromegaly.  LUNGS:                        Clear to auscultation.  No rhonchi or wheezes.  HEART:  Regular rate and rhythm.  No murmurs or gallops.  BREASTS:                      Done during the first trimester and reported to be normal.  ABDOMEN:                      Gravid.  Pendulous and obese.  PELVIC:                       Clear, gross rupture of membranes evident with the amniotic fluid pooling in the vaginal vault. Cervix is fingertip and long and ballotable presentation which was vertex confirmed by bedside ultrasound.  EXTREMITIES:                  DTR 1+, negative clonus, trace edema.  PRENATAL LABS:                A negative blood type.  Negative antibiotic screen.  Sickle cell trait negative.  VDRL was nonreactive.  Rubella was negative. Hepatitis B surface antigen and HIV were negative.  Alpha fetoprotein was normal. Blood sugar screen was normal.  GBS culture was negative.  ASSESSMENT:                   A 40 year old, Gravida 3, Para 0, Abortus 2, obese female currently 37-1/[redacted] weeks gestational age with spontaneous rupture of membranes at approximately midnight on Sep 17, 1999 with no evidence of labor, afebrile.  History of chronic hypertension, on no medications. Normotensive now and asymptomatic  otherwise.  PLAN:                         The patient will be admitted to [facility] Delivery and will be started on high dose Pitocin. Her GBS culture was negative.  Will following accordingly. DD:  09/17/99 TD:  09/17/99 Job: [id number]

## 2010-09-08 NOTE — Discharge Summary (Signed)
[facility]  Patient:    Makayla Good, Makayla Good                        MRN: [id number] Adm. Date:  [id number] Disc. Date: [id number] Attending:  Tonye Royalty Dictator:   Antony Contras, [facility]                           Discharge Summary  DISCHARGE DIAGNOSES:          1. Intrauterine pregnancy at 37-1/2 weeks with                                  premature rupture of membranes.                               2. History of chronic hypertension on no                                  medications.                               3. Delivery of viable infant.                               4. Fever unknown etiology.  PROCEDURE:                    Low transverse cesarean section with delivery of viable infant.  HISTORY OF PRESENT ILLNESS:   The patient is a 40 year old gravida 3, para 0-0-2-0 with a LMP of January 01, 1999 and an [facility] of October 07, 1999. Prenatal risk factors include chronic hypertension, she is A negative, rubella negative, history of asthma, irritable bowel syndrome, and she is also obese. Initial pregnancy weight was 277.  Prenatal labs were as follows.  Blood type A negative, rubella negative, HIV/HBSAG/RPR nonreactive, MSAFP within normal limits, GBS was negative.  HOSPITAL COURSE AND TREATMENT:                The patient was admitted on Sep 17, 1999 at 37-1/2 weeks with spontaneous rupture of membranes.  At this point, her cervix was finger tip and long.  Initial blood pressure on admission was 151/101 and, on repeat, was 147/57, 128/72, 131/72 and 146/69.  She denied any headaches, visual disturbances or right upper quadrant pain.  She was admitted to labor and deliver and started on high dose Pitocin.  Her cervical exam failed to change during the course of labor and, due to the prolonged rupture of membranes, low cervical transverse cesarean section was performed by Dr. Lily Peer.  She was delivered of an APGAR 6 and 9, 9 pound female  infant.  She did have some mild uterine apne which did respond to Methergine 0.2 mg IM. Her postoperative course was complicated by fever on her third postoperative day. She was begun on Keflex and was later switched to Unasyn.  By her sixth postoperative day, she still had some persistent fever but, at this point, she did have some breast engorgement and it was felt that possibly the fever could be related to this.  Aggressive breast  care was described and she was able to be discharged after remaining afebrile for 24 hours.  Her postoperative CBC were as follows.  On May 28, hemoglobin 10.3, hematocrit 31.1, white blood cell count 8.8, platelets 200.  On May 31, hemoglobin 9. , hematocrit 26.3, white cell count still 8.8, platelets 250.  DISPOSITION:                  The patient is to check her temperature at home and notify the office if she has elevations.  She was discharged on Augmentin 800 mg t.i.d. and over-the-counter Motrin for pain.  She is to follow up in the office in two weeks for blood pressure recheck and, also, in six weeks for her checkup. DD:  10/09/99 TD:  10/10/99 Job: [id number]

## 2010-09-08 NOTE — Op Note (Signed)
[facility]  Patient:    Makayla Good, Makayla Good                        MRN: [id number] Proc. Date: 09/17/99 Adm. Date:  [id number] Attending:  Tonye Royalty                           Operative Report  PREOPERATIVE DIAGNOSIS:       Term intrauterine pregnancy.  Premature rupture of membranes/prolonged rupture of membranes.  Morbid obesity/dystocia.  Suspected fetal macrosomia.  Arrest of first stage of labor.  POSTOPERATIVE DIAGNOSIS:      Term intrauterine pregnancy.  Premature rupture of membranes/prolonged rupture of membranes.  Morbid obesity/dystocia.  Suspected fetal macrosomia.  Arrest of first stage of labor.  OPERATION:                    Primary low transverse cesarean section.  SURGEON:                      Juan H. Lily Peer, M.D.  ASSISTANT:                    Sheronette A. Cherly Hensen, M.D.  ANESTHESIA:                   Epidural, but due to ineffectual coverage on the patients right lateral side, she underwent general endotracheal anesthesia.  FINDINGS:                     A viable female infant with Apgars of 6 and 9 with  weight of 9 pounds even, arterial cord pH was 7.27.  ESTIMATED BLOOD LOSS:  INDICATIONS:                  The patient is a 40 year old, gravida 1, para 0, morbidly obese with spontaneous rupture of membranes at approximately 2400 hours on May 27, and was started on antibiotics for GBS prophylaxis after 12 hours of rupture of membranes.  Despite high dose of Pitocin, monitoring was difficult due to the patients obesity and cervix continued to be long, closed, and posterior nd also suspected large for gestational age fetus and a narrow maternal pelvis.  DESCRIPTION OF PROCEDURE:     After the patient was adequately counseled, she was taken to the operating room where after the patients abdomen was prepped and draped in the usual sterile fashion and a Foley catheter in place for urinary monitorization, a  Pfannenstiel skin incision was made adjacent to a previous Pfannenstiel scar.  The incision was made through the skin down to subcutaneous  tissue.  Surgery had to be stopped because the patient was experiencing lateralization of pain on her right side and 0.25% Marcaine was sprinkled on the fascia and subcutaneously with minimum response and the patient underwent general endotracheal anesthesia.  After this, immediately the fascia was incised in a transverse fashion and the midline raphe was entered.  The peritoneal cavity was entered cautiously, the bladder flap was established, the lower uterine segment was incised in a transverse fashion.  Clear amniotic fluid was present.  Due to the  large nature of the size of the baby and incision size, vacuum extraction was used to deliver the fetus.  Immediately, the cord was doubly clamped and excised. The nasopharyngeal area was bulb suctioned and the newborn was passed off to the  pediatricians who were in attendance who gave the above mentioned parameters. After cord blood was obtained, the placenta was delivered from the intrauterine  cavity.  The uterus was swept clear of remaining products of conception and was  exteriorized.  The uterine incision was closed first in a locking stitch fashion with 0 Vicryl suture followed by second layer in an imbricating manner.  It was  evident at this time that the patient had a previous right salpingo-oophorectomy. The left tube and ovary was normal.  Some desidual reaction was noted on the serosal surface, otherwise normal in appearance.  The uterus was then placed back into the abdominal cavity.  Of note, due to uterine atony, she received 0.2 mg M. After the pelvic cavity was copiously irrigated with normal saline solution, assessment of the transverse incision demonstrated adequate hemostasis.  The visceroperitoneum was not reapproximated and the fascia was closed with a running stitch  of 0 Vicryl suture.  The subcutaneous bleeders were Bovie cauterized and the skin was reapproximated with skin clips followed by placement of Xeroform gauze and 4 x 8 dressing.  Estimated blood loss from the procedure was 800 cc.  IV fluid as 1300 cc of Ringers lactate and urine output was 700 cc.  The patient was extubated and transferred to the recovery room with stable vital signs. DD:  09/17/99 TD:  09/19/99 Job: [id number]

## 2010-09-11 ENCOUNTER — Encounter: Payer: Self-pay | Admitting: *Deleted

## 2010-10-10 ENCOUNTER — Other Ambulatory Visit: Payer: Self-pay | Admitting: Family Medicine

## 2010-10-10 DIAGNOSIS — Z1231 Encounter for screening mammogram for malignant neoplasm of breast: Secondary | ICD-10-CM

## 2010-10-17 ENCOUNTER — Ambulatory Visit (INDEPENDENT_AMBULATORY_CARE_PROVIDER_SITE_OTHER): Payer: Private Health Insurance - Indemnity | Admitting: Internal Medicine

## 2010-10-17 ENCOUNTER — Encounter: Payer: Self-pay | Admitting: Internal Medicine

## 2010-10-17 DIAGNOSIS — J069 Acute upper respiratory infection, unspecified: Secondary | ICD-10-CM

## 2010-10-17 MED ORDER — ALBUTEROL SULFATE HFA 108 (90 BASE) MCG/ACT IN AERS: 2.0000 | INHALATION_SPRAY | Freq: Four times a day (QID) | RESPIRATORY_TRACT | Status: DC | PRN

## 2010-10-17 MED ORDER — AZITHROMYCIN 250 MG PO TABS: ORAL_TABLET | ORAL | Status: AC

## 2010-10-17 NOTE — Assessment & Plan Note (Signed)
URI, possibly bronchitis and mild asthma exacerbation See instructions

## 2010-10-17 NOTE — Patient Instructions (Signed)
Rest, fluids , tylenol For cough, take Mucinex DM twice a day as needed  Ventolin as needed for wheezing Saline nose spray to help nasal congestion If no better in 2-3 days , start the  antibiotic as prescribed  (zpack0 Call if no better in 4-5 days  Call anytime if the symptoms are severe, you have high fever, short of breath

## 2010-10-17 NOTE — Progress Notes (Signed)
  Subjective:    Patient ID: Makayla Good, female    DOB: May 19, 1970, 40 y.o.   MRN: [id number]  HPI  Symptoms started  5 days ago, sinus pressure, cough, post nasal dripping. She has a history of asthma, still smokes half pack a day.  Past Medical History  Diagnosis Date  . Hyperlipidemia   . Hypertension   . Asthma   . Polycystic ovary     Left  . Eczema   . Obesity     Past Surgical History  Procedure Date  . Cesarean section 02/01/2005  . Tubal ligation 02/01/2005     Review of Systems Admits to increased wheezing since the onset of symptoms, occasionally produce some clear sputum. Denies fever Denies nausea or vomiting No shortness of breath.    Objective:   Physical Exam  Constitutional: She appears well-developed and well-nourished. No distress.  HENT:  Head: Normocephalic and atraumatic.  Right Ear: External ear normal.  Left Ear: External ear normal.  Mouth/Throat: No oropharyngeal exudate.       Not tender to palpation in the maxillary sinuses. Raspy voice  Cardiovascular: Normal rate, regular rhythm, normal heart sounds and intact distal pulses.   No murmur heard. Pulmonary/Chest: Effort normal and breath sounds normal.       Few  rhonchi bilaterally, they clear with cough. No wheezing, no increased work of breathing  Musculoskeletal: She exhibits no edema.  Skin: She is not diaphoretic.          Assessment & Plan:

## 2010-11-10 ENCOUNTER — Ambulatory Visit
Admission: RE | Admit: 2010-11-10 | Discharge: 2010-11-10 | Disposition: A | Discharge status: Home / Self Care | Payer: Private Health Insurance - Indemnity | Source: Ambulatory Visit | Attending: Family Medicine | Admitting: Family Medicine

## 2010-11-10 DIAGNOSIS — Z1231 Encounter for screening mammogram for malignant neoplasm of breast: Secondary | ICD-10-CM

## 2011-02-16 ENCOUNTER — Encounter: Payer: Self-pay | Admitting: Family Medicine

## 2011-02-16 ENCOUNTER — Ambulatory Visit (INDEPENDENT_AMBULATORY_CARE_PROVIDER_SITE_OTHER): Payer: Private Health Insurance - Indemnity | Admitting: Family Medicine

## 2011-02-16 VITALS — BP 130/92 | HR 115 | Temp 98.1°F | Wt 271.0 lb

## 2011-02-16 DIAGNOSIS — E785 Hyperlipidemia, unspecified: Secondary | ICD-10-CM

## 2011-02-16 DIAGNOSIS — I10 Essential (primary) hypertension: Secondary | ICD-10-CM

## 2011-02-16 DIAGNOSIS — M25569 Pain in unspecified knee: Secondary | ICD-10-CM

## 2011-02-16 MED ORDER — MELOXICAM 15 MG PO TABS: 15.0000 mg | ORAL_TABLET | Freq: Every day | ORAL | Status: DC

## 2011-02-16 NOTE — Progress Notes (Signed)
  Subjective:    Makayla Good is a 40 y.o. female who presents with knee pain involving the right knee. Onset was gradual, starting about 1 month ago. Inciting event: none known. Current symptoms include: crepitus sensation, giving out, popping sensation and stiffness. Pain is aggravated by any weight bearing, rising after sitting, standing and walking. Patient has had no prior knee problems. Evaluation to date: none. Treatment to date: none.  The following portions of the patient's history were reviewed and updated as appropriate: allergies, current medications, past family history, past medical history, past social history, past surgical history and problem list.   Review of Systems Pertinent items are noted in HPI.   Objective:    There were no vitals taken for this visit. Right knee: positive exam findings: crepitus and negative exam findings: no effusion, no erythema, no tenderness, no patellar laxity and FROM  Left knee:  normal and no effusion, full active range of motion, no joint line tenderness, ligamentous structures intact.   X-ray right knee: not indicated    Assessment:    Right knee pain    Plan:    Natural history and expected course discussed. Questions answered. Transport planner distributed. Rest, ice, compression, and elevation (RICE) therapy. Reduction in offending activity. Patellar compression sleeve. ortho if no improvement

## 2011-02-16 NOTE — Patient Instructions (Signed)
Knee Pain The knee is the complex joint between your thigh and your lower leg. It is made up of bones, tendons, ligaments, and cartilage. The bones that make up the knee are:  The femur in the thigh.   The tibia and fibula in the lower leg.   The patella or kneecap riding in the groove on the lower femur.  CAUSES  Knee pain is a common complaint with many causes. A few of these causes are:  Injury, such as:   A ruptured ligament or tendon injury.   Torn cartilage.   Medical conditions, such as:   Gout   Arthritis   Infections   Overuse, over training or overdoing a physical activity.  Knee pain can be minor or severe. Knee pain can accompany debilitating injury. Minor knee problems often respond well to self-care measures or get well on their own. More serious injuries may need medical intervention or even surgery. SYMPTOMS The knee is complex. Symptoms of knee problems can vary widely. Some of the problems are:  Pain with movement and weight bearing.   Swelling and tenderness.   Buckling of the knee.   Inability to straighten or extend your knee.   Your knee locks and you cannot straighten it.   Warmth and redness with pain and fever.   Deformity or dislocation of the kneecap.  DIAGNOSIS  Determining what is wrong may be very straight forward such as when there is an injury. It can also be challenging because of the complexity of the knee. Tests to make a diagnosis may include:  Your caregiver taking a history and doing a physical exam.   Routine X-rays can be used to rule out other problems. X-rays will not reveal a cartilage tear. Some injuries of the knee can be diagnosed by:   Arthroscopy a surgical technique by which a small video camera is inserted through tiny incisions on the sides of the knee. This procedure is used to examine and repair internal knee joint problems. Tiny instruments can be used during arthroscopy to repair the torn knee cartilage  (meniscus).   Arthrography is a radiology technique. A contrast liquid is directly injected into the knee joint. Internal structures of the knee joint then become visible on X-ray film.   An MRI scan is a non x-ray radiology procedure in which magnetic fields and a computer produce two- or three-dimensional images of the inside of the knee. Cartilage tears are often visible using an MRI scanner. MRI scans have largely replaced arthrography in diagnosing cartilage tears of the knee.   Blood work.   Examination of the fluid that helps to lubricate the knee joint (synovial fluid). This is done by taking a sample out using a needle and a syringe.  TREATMENT The treatment of knee problems depends on the cause. Some of these treatments are:  Depending on the injury, proper casting, splinting, surgery or physical therapy care will be needed.   Give yourself adequate recovery time. Do not overuse your joints. If you begin to get sore during workout routines, back off. Slow down or do fewer repetitions.   For repetitive activities such as cycling or running, maintain your strength and nutrition.   Alternate muscle groups. For example if you are a weight lifter, work the upper body on one day and the lower body the next.   Either tight or weak muscles do not give the proper support for your knee. Tight or weak muscles do not absorb the stress placed   on the knee joint. Keep the muscles surrounding the knee strong.   Take care of mechanical problems.   If you have flat feet, orthotics or special shoes may help. See your caregiver if you need help.   Arch supports, sometimes with wedges on the inner or outer aspect of the heel, can help. These can shift pressure away from the side of the knee most bothered by osteoarthritis.   A brace called an "unloader" brace also may be used to help ease the pressure on the most arthritic side of the knee.   If your caregiver has prescribed crutches, braces,  wraps or ice, use as directed. The acronym for this is PRICE. This means protection, rest, ice, compression and elevation.   Nonsteroidal anti-inflammatory drugs (NSAID's), can help relieve pain. But if taken immediately after an injury, they may actually increase swelling. Take NSAID's with food in your stomach. Stop them if you develop stomach problems. Do not take these if you have a history of ulcers, stomach pain or bleeding from the bowel. Do not take without your caregiver's approval if you have problems with fluid retention, heart failure, or kidney problems.   For ongoing knee problems, physical therapy may be helpful.   Glucosamine and chondroitin are over-the-counter dietary supplements. Both may help relieve the pain of osteoarthritis in the knee. These medicines are different from the usual anti-inflammatory drugs. Glucosamine may decrease the rate of cartilage destruction.   Injections of a corticosteroid drug into your knee joint may help reduce the symptoms of an arthritis flare-up. They may provide pain relief that lasts a few months. You may have to wait a few months between injections. The injections do have a small increased risk of infection, water retention and elevated blood sugar levels.   Hyaluronic acid injected into damaged joints may ease pain and provide lubrication. These injections may work by reducing inflammation. A series of shots may give relief for as long as 6 months.   Topical painkillers. Applying certain ointments to your skin may help relieve the pain and stiffness of osteoarthritis. Ask your pharmacist for suggestions. Many over the-counter products are approved for temporary relief of arthritis pain.   In some countries, doctors often prescribe topical NSAID's for relief of chronic conditions such as arthritis and tendinitis. A review of treatment with NSAID creams found that they worked as well as oral medications but without the serious side effects.    PREVENTION  Maintain a healthy weight. Extra pounds put more strain on your joints.   Get strong, stay limber. Weak muscles are a common cause of knee injuries. Stretching is important. Include flexibility exercises in your workouts.   Be smart about exercise. If you have osteoarthritis, chronic knee pain or recurring injuries, you may need to change the way you exercise. This does not mean you have to stop being active. If your knees ache after jogging or playing basketball, consider switching to swimming, water aerobics or other low-impact activities, at least for a few days a week. Sometimes limiting high-impact activities will provide relief.   Make sure your shoes fit well. Choose footwear that is right for your sport.   Protect your knees. Use the proper gear for knee-sensitive activities. Use kneepads when playing volleyball or laying carpet. Buckle your seat belt every time you drive. Most shattered kneecaps occur in car accidents.   Rest when you are tired.  SEEK MEDICAL CARE IF:  You have knee pain that is continual and does not   seem to be getting better.  SEEK IMMEDIATE MEDICAL CARE IF:  Your knee joint feels hot to the touch and you have a high fever. MAKE SURE YOU:   Understand these instructions.   Will watch your condition.   Will get help right away if you are not doing well or get worse.  Document Released: 02/04/2007 Document Revised: 12/20/2010 Document Reviewed: 02/04/2007 [employer] Patient Information 2012 [employer]. 

## 2011-02-19 ENCOUNTER — Encounter: Payer: Self-pay | Admitting: Family Medicine

## 2011-02-27 ENCOUNTER — Other Ambulatory Visit: Payer: Self-pay | Admitting: Family Medicine

## 2011-02-27 DIAGNOSIS — E785 Hyperlipidemia, unspecified: Secondary | ICD-10-CM

## 2011-02-28 ENCOUNTER — Other Ambulatory Visit (INDEPENDENT_AMBULATORY_CARE_PROVIDER_SITE_OTHER): Payer: Private Health Insurance - Indemnity

## 2011-02-28 DIAGNOSIS — I10 Essential (primary) hypertension: Secondary | ICD-10-CM

## 2011-02-28 DIAGNOSIS — E785 Hyperlipidemia, unspecified: Secondary | ICD-10-CM

## 2011-02-28 LAB — HEPATIC FUNCTION PANEL
ALT: 22 U/L (ref 0–35)
AST: 21 U/L (ref 0–37)
Albumin: 3.7 g/dL (ref 3.5–5.2)
Alkaline Phosphatase: 58 U/L (ref 39–117)
Bilirubin, Direct: 0 mg/dL (ref 0.0–0.3)
Total Bilirubin: 0.7 mg/dL (ref 0.3–1.2)
Total Protein: 7.1 g/dL (ref 6.0–8.3)

## 2011-02-28 LAB — BASIC METABOLIC PANEL
BUN: 8 mg/dL (ref 6–23)
CO2: 27 mEq/L (ref 19–32)
Calcium: 9.2 mg/dL (ref 8.4–10.5)
Chloride: 105 mEq/L (ref 96–112)
Creatinine, Ser: 0.6 mg/dL (ref 0.4–1.2)
GFR: 147.85 mL/min (ref >60.00)
Glucose, Bld: 88 mg/dL (ref 70–99)
Potassium: 3.8 mEq/L (ref 3.5–5.1)
Sodium: 140 mEq/L (ref 135–145)

## 2011-02-28 LAB — LIPID PANEL
Cholesterol: 168 mg/dL (ref 0–200)
HDL: 42.4 mg/dL (ref >39.00)
LDL Cholesterol: 106 mg/dL — ABNORMAL HIGH (ref 0–99)
Total CHOL/HDL Ratio: 4
Triglycerides: 99 mg/dL (ref 0.0–149.0)
VLDL: 19.8 mg/dL (ref 0.0–40.0)

## 2011-02-28 NOTE — Progress Notes (Signed)
Labs only

## 2011-03-19 ENCOUNTER — Ambulatory Visit (INDEPENDENT_AMBULATORY_CARE_PROVIDER_SITE_OTHER): Payer: Private Health Insurance - Indemnity | Admitting: Family Medicine

## 2011-03-19 ENCOUNTER — Encounter: Payer: Self-pay | Admitting: Family Medicine

## 2011-03-19 VITALS — BP 148/94 | HR 110 | Temp 99.5°F | Wt 272.2 lb

## 2011-03-19 DIAGNOSIS — I10 Essential (primary) hypertension: Secondary | ICD-10-CM

## 2011-03-19 DIAGNOSIS — M25561 Pain in right knee: Secondary | ICD-10-CM

## 2011-03-19 DIAGNOSIS — M25569 Pain in unspecified knee: Secondary | ICD-10-CM

## 2011-03-19 MED ORDER — AMLODIPINE BESY-BENAZEPRIL HCL 10-20 MG PO CAPS: 1.0000 | ORAL_CAPSULE | Freq: Every day | ORAL | Status: DC

## 2011-03-19 NOTE — Progress Notes (Signed)
Addended by: Arnette Norris on: 03/19/2011 04:28 PM   Modules accepted: Orders

## 2011-03-19 NOTE — Progress Notes (Signed)
  Subjective:    Makayla Good is a 40 y.o. female who presents for follow up on a knee problem involving the right knee. Onset was gradual, starting about 2 months ago. Inciting event: none known. Current symptoms include: crepitus sensation, giving out, stiffness and swelling. Pain is aggravated by any weight bearing. Patient has had no prior knee problems. Evaluation to date: none. Treatment to date: elastic supporter which is not very effective and prescription NSAIDS which are not very effective.  The following portions of the patient's history were reviewed and updated as appropriate: allergies, current medications, past family history, past medical history, past social history, past surgical history and problem list.   Review of Systems Pertinent items are noted in HPI.   Objective:    BP 148/94  Pulse 110  Temp(Src) 99.5 F (37.5 C) (Oral)  Wt 272 lb 3.2 oz (123.469 kg)  SpO2 97% Right knee: positive exam findings: medial joint line tenderness, lateral joint line tenderness, warmth and tenderness noted lateral, medial and negative exam findings: no effusion  Left knee:  normal and no effusion, full active range of motion, no joint line tenderness, ligamentous structures intact.   X-ray R knee: not indicated    Assessment:    Right knee pain  htn--increase lotrel 10 / 20  Plan:    Natural history and expected course discussed. Questions answered. Transport planner distributed. Rest, ice, compression, and elevation (RICE) therapy. Reduction in offending activity. NSAIDs per medication orders. Orthopedics referral.

## 2011-05-14 ENCOUNTER — Ambulatory Visit (INDEPENDENT_AMBULATORY_CARE_PROVIDER_SITE_OTHER): Payer: Managed Care, Other (non HMO) | Admitting: Family Medicine

## 2011-05-14 ENCOUNTER — Encounter: Payer: Self-pay | Admitting: Family Medicine

## 2011-05-14 VITALS — BP 124/84 | HR 107 | Temp 99.3°F | Ht 65.75 in | Wt 273.4 lb

## 2011-05-14 DIAGNOSIS — Z23 Encounter for immunization: Secondary | ICD-10-CM

## 2011-05-14 DIAGNOSIS — E669 Obesity, unspecified: Secondary | ICD-10-CM

## 2011-05-14 DIAGNOSIS — F17200 Nicotine dependence, unspecified, uncomplicated: Secondary | ICD-10-CM

## 2011-05-14 MED ORDER — PNEUMOCOCCAL VAC POLYVALENT 25 MCG/0.5ML IJ INJ: 0.5000 mL | INJECTION | Freq: Once | INTRAMUSCULAR | Status: DC

## 2011-05-14 NOTE — Progress Notes (Signed)
  Subjective:    Patient ID: Makayla Good, female    DOB: 18-Jun-1970, 41 y.o.   MRN: [id number]  HPI  Pt here to have paper work filled out for ins co for work.  No complaints  Review of Systems As above    Objective:   Physical Exam  Constitutional: She appears well-developed and well-nourished.  Psychiatric: She has a normal mood and affect. Her behavior is normal. Judgment and thought content normal.          Assessment & Plan:  Obesity--- con't diet and exercise tob abuse---  D/w pt how to stop smoking

## 2011-05-14 NOTE — Patient Instructions (Signed)
Smoking Cessation This document explains the best ways for you to quit smoking and new treatments to help. It lists new medicines that can double or triple your chances of quitting and quitting for good. It also considers ways to avoid relapses and concerns you may have about quitting, including weight gain. NICOTINE: A POWERFUL ADDICTION If you have tried to quit smoking, you know how hard it can be. It is hard because nicotine is a very addictive drug. For some people, it can be as addictive as heroin or cocaine. Usually, people make 2 or 3 tries, or more, before finally being able to quit. Each time you try to quit, you can learn about what helps and what hurts. Quitting takes hard work and a lot of effort, but you can quit smoking. QUITTING SMOKING IS ONE OF THE MOST IMPORTANT THINGS YOU WILL EVER DO.  You will live longer, feel better, and live better.   The impact on your body of quitting smoking is felt almost immediately:   Within 20 minutes, blood pressure decreases. Pulse returns to its normal level.   After 8 hours, carbon monoxide levels in the blood return to normal. Oxygen level increases.   After 24 hours, chance of heart attack starts to decrease. Breath, hair, and body stop smelling like smoke.   After 48 hours, damaged nerve endings begin to recover. Sense of taste and smell improve.   After 72 hours, the body is virtually free of nicotine. Bronchial tubes relax and breathing becomes easier.   After 2 to 12 weeks, lungs can hold more air. Exercise becomes easier and circulation improves.   Quitting will reduce your risk of having a heart attack, stroke, cancer, or lung disease:   After 1 year, the risk of coronary heart disease is cut in half.   After 5 years, the risk of stroke falls to the same as a nonsmoker.   After 10 years, the risk of lung cancer is cut in half and the risk of other cancers decreases significantly.   After 15 years, the risk of coronary heart  disease drops, usually to the level of a nonsmoker.   If you are pregnant, quitting smoking will improve your chances of having a healthy baby.   The people you live with, especially your children, will be healthier.   You will have extra money to spend on things other than cigarettes.  FIVE KEYS TO QUITTING Studies have shown that these 5 steps will help you quit smoking and quit for good. You have the best chances of quitting if you use them together: 1. Get ready.  2. Get support and encouragement.  3. Learn new skills and behaviors.  4. Get medicine to reduce your nicotine addiction and use it correctly.  5. Be prepared for relapse or difficult situations. Be determined to continue trying to quit, even if you do not succeed at first.  1. GET READY  Set a quit date.   Change your environment.   Get rid of ALL cigarettes, ashtrays, matches, and lighters in your home, car, and place of work.   Do not let people smoke in your home.   Review your past attempts to quit. Think about what worked and what did not.   Once you quit, do not smoke. NOT EVEN A PUFF!  2. GET SUPPORT AND ENCOURAGEMENT Studies have shown that you have a better chance of being successful if you have help. You can get support in many ways.  Tell   your family, friends, and coworkers that you are going to quit and need their support. Ask them not to smoke around you.   Talk to your caregivers (doctor, dentist, nurse, pharmacist, psychologist, and/or smoking counselor).   Get individual, group, or telephone counseling and support. The more counseling you have, the better your chances are of quitting. Programs are available at local hospitals and health centers. Call your local health department for information about programs in your area.   Spiritual beliefs and practices may help some smokers quit.   Quit meters are small computer programs online or downloadable that keep track of quit statistics, such as amount  of "quit-time," cigarettes not smoked, and money saved.   Many smokers find one or more of the many self-help books available useful in helping them quit and stay off tobacco.  3. LEARN NEW SKILLS AND BEHAVIORS  Try to distract yourself from urges to smoke. Talk to someone, go for a walk, or occupy your time with a task.   When you first try to quit, change your routine. Take a different route to work. Drink tea instead of coffee. Eat breakfast in a different place.   Do something to reduce your stress. Take a hot bath, exercise, or read a book.   Plan something enjoyable to do every day. Reward yourself for not smoking.   Explore interactive web-based programs that specialize in helping you quit.  4. GET MEDICINE AND USE IT CORRECTLY Medicines can help you stop smoking and decrease the urge to smoke. Combining medicine with the above behavioral methods and support can quadruple your chances of successfully quitting smoking. The [place] Food and Drug Administration (FDA) has approved 7 medicines to help you quit smoking. These medicines fall into 3 categories.  Nicotine replacement therapy (delivers nicotine to your body without the negative effects and risks of smoking):   Nicotine gum: Available over-the-counter.   Nicotine lozenges: Available over-the-counter.   Nicotine inhaler: Available by prescription.   Nicotine nasal spray: Available by prescription.   Nicotine skin patches (transdermal): Available by prescription and over-the-counter.   Antidepressant medicine (helps people abstain from smoking, but how this works is unknown):   Bupropion sustained-release (SR) tablets: Available by prescription.   Nicotinic receptor partial agonist (simulates the effect of nicotine in your brain):   Varenicline tartrate tablets: Available by prescription.   Ask your caregiver for advice about which medicines to use and how to use them. Carefully read the information on the package.    Everyone who is trying to quit may benefit from using a medicine. If you are pregnant or trying to become pregnant, nursing an infant, you are under age 18, or you smoke fewer than 10 cigarettes per day, talk to your caregiver before taking any nicotine replacement medicines.   You should stop using a nicotine replacement product and call your caregiver if you experience nausea, dizziness, weakness, vomiting, fast or irregular heartbeat, mouth problems with the lozenge or gum, or redness or swelling of the skin around the patch that does not go away.   Do not use any other product containing nicotine while using a nicotine replacement product.   Talk to your caregiver before using these products if you have diabetes, heart disease, asthma, stomach ulcers, you had a recent heart attack, you have high blood pressure that is not controlled with medicine, a history of irregular heartbeat, or you have been prescribed medicine to help you quit smoking.  5. BE PREPARED FOR RELAPSE OR   DIFFICULT SITUATIONS  Most relapses occur within the first 3 months after quitting. Do not be discouraged if you start smoking again. Remember, most people try several times before they finally quit.   You may have symptoms of withdrawal because your body is used to nicotine. You may crave cigarettes, be irritable, feel very hungry, cough often, get headaches, or have difficulty concentrating.   The withdrawal symptoms are only temporary. They are strongest when you first quit, but they will go away within 10 to 14 days.  Here are some difficult situations to watch for:  Alcohol. Avoid drinking alcohol. Drinking lowers your chances of successfully quitting.   Caffeine. Try to reduce the amount of caffeine you consume. It also lowers your chances of successfully quitting.   Other smokers. Being around smoking can make you want to smoke. Avoid smokers.   Weight gain. Many smokers will gain weight when they quit, usually  less than 10 pounds. Eat a healthy diet and stay active. Do not let weight gain distract you from your main goal, quitting smoking. Some medicines that help you quit smoking may also help delay weight gain. You can always lose the weight gained after you quit.   Bad mood or depression. There are a lot of ways to improve your mood other than smoking.  If you are having problems with any of these situations, talk to your caregiver. SPECIAL SITUATIONS AND CONDITIONS Studies suggest that everyone can quit smoking. Your situation or condition can give you a special reason to quit.  Pregnant women/new mothers: By quitting, you protect your baby's health and your own.   Hospitalized patients: By quitting, you reduce health problems and help healing.   Heart attack patients: By quitting, you reduce your risk of a second heart attack.   Lung, head, and neck cancer patients: By quitting, you reduce your chance of a second cancer.   Parents of children and adolescents: By quitting, you protect your children from illnesses caused by secondhand smoke.  QUESTIONS TO THINK ABOUT Think about the following questions before you try to stop smoking. You may want to talk about your answers with your caregiver.  Why do you want to quit?   If you tried to quit in the past, what helped and what did not?   What will be the most difficult situations for you after you quit? How will you plan to handle them?   Who can help you through the tough times? Your family? Friends? Caregiver?   What pleasures do you get from smoking? What ways can you still get pleasure if you quit?  Here are some questions to ask your caregiver:  How can you help me to be successful at quitting?   What medicine do you think would be best for me and how should I take it?   What should I do if I need more help?   What is smoking withdrawal like? How can I get information on withdrawal?  Quitting takes hard work and a lot of effort,  but you can quit smoking. FOR MORE INFORMATION  Smokefree.gov (http://www.smokefree.gov) provides free, accurate, evidence-based information and professional assistance to help support the immediate and long-term needs of people trying to quit smoking. Document Released: 04/03/2001 Document Revised: 12/20/2010 Document Reviewed: 01/24/2009 [employer] Patient Information 2012 [employer]. 

## 2011-10-01 ENCOUNTER — Other Ambulatory Visit: Payer: Self-pay | Admitting: Family Medicine

## 2011-10-10 ENCOUNTER — Other Ambulatory Visit: Payer: Self-pay | Admitting: Family Medicine

## 2011-10-10 DIAGNOSIS — Z1231 Encounter for screening mammogram for malignant neoplasm of breast: Secondary | ICD-10-CM

## 2011-10-18 ENCOUNTER — Encounter: Payer: Self-pay | Admitting: Family Medicine

## 2011-10-18 ENCOUNTER — Ambulatory Visit (INDEPENDENT_AMBULATORY_CARE_PROVIDER_SITE_OTHER): Payer: Managed Care, Other (non HMO) | Admitting: Family Medicine

## 2011-10-18 ENCOUNTER — Other Ambulatory Visit (HOSPITAL_COMMUNITY)
Admission: RE | Admit: 2011-10-18 | Discharge: 2011-10-18 | Disposition: A | Discharge status: Home / Self Care | Payer: Managed Care, Other (non HMO) | Source: Ambulatory Visit | Attending: Family Medicine | Admitting: Family Medicine

## 2011-10-18 VITALS — BP 122/76 | HR 106 | Temp 98.7°F | Ht 65.75 in | Wt 288.0 lb

## 2011-10-18 DIAGNOSIS — F17200 Nicotine dependence, unspecified, uncomplicated: Secondary | ICD-10-CM

## 2011-10-18 DIAGNOSIS — I10 Essential (primary) hypertension: Secondary | ICD-10-CM

## 2011-10-18 DIAGNOSIS — Z124 Encounter for screening for malignant neoplasm of cervix: Secondary | ICD-10-CM

## 2011-10-18 DIAGNOSIS — Z01419 Encounter for gynecological examination (general) (routine) without abnormal findings: Secondary | ICD-10-CM | POA: Insufficient documentation

## 2011-10-18 DIAGNOSIS — M25561 Pain in right knee: Secondary | ICD-10-CM

## 2011-10-18 DIAGNOSIS — Z0000 Encounter for general adult medical examination without abnormal findings: Secondary | ICD-10-CM

## 2011-10-18 DIAGNOSIS — M25569 Pain in unspecified knee: Secondary | ICD-10-CM

## 2011-10-18 LAB — POCT URINALYSIS DIPSTICK
Bilirubin, UA: NEGATIVE
Blood, UA: NEGATIVE
Glucose, UA: NEGATIVE
Ketones, UA: NEGATIVE
Leukocytes, UA: NEGATIVE
Nitrite, UA: NEGATIVE
Protein, UA: NEGATIVE
Spec Grav, UA: 1.01
Urobilinogen, UA: 0.2
pH, UA: 7

## 2011-10-18 LAB — HEPATIC FUNCTION PANEL
ALT: 25 U/L (ref 0–35)
AST: 23 U/L (ref 0–37)
Albumin: 3.9 g/dL (ref 3.5–5.2)
Alkaline Phosphatase: 58 U/L (ref 39–117)
Bilirubin, Direct: 0.1 mg/dL (ref 0.0–0.3)
Total Bilirubin: 0.6 mg/dL (ref 0.3–1.2)
Total Protein: 7.4 g/dL (ref 6.0–8.3)

## 2011-10-18 LAB — CBC WITH DIFFERENTIAL/PLATELET
Basophils Absolute: 0 10*3/uL (ref 0.0–0.1)
Basophils Relative: 0.6 % (ref 0.0–3.0)
Eosinophils Absolute: 0.2 10*3/uL (ref 0.0–0.7)
Eosinophils Relative: 2.6 % (ref 0.0–5.0)
HCT: 41.2 % (ref 36.0–46.0)
Hemoglobin: 13.5 g/dL (ref 12.0–15.0)
Lymphocytes Relative: 39.3 % (ref 12.0–46.0)
Lymphs Abs: 3.1 10*3/uL (ref 0.7–4.0)
MCHC: 32.9 g/dL (ref 30.0–36.0)
MCV: 97 fl (ref 78.0–100.0)
Monocytes Absolute: 0.6 10*3/uL (ref 0.1–1.0)
Monocytes Relative: 7.4 % (ref 3.0–12.0)
Neutro Abs: 4 10*3/uL (ref 1.4–7.7)
Neutrophils Relative %: 50.1 % (ref 43.0–77.0)
Platelets: 229 10*3/uL (ref 150.0–400.0)
RBC: 4.24 Mil/uL (ref 3.87–5.11)
RDW: 13.7 % (ref 11.5–14.6)
WBC: 7.9 10*3/uL (ref 4.5–10.5)

## 2011-10-18 LAB — BASIC METABOLIC PANEL
BUN: 11 mg/dL (ref 6–23)
CO2: 25 mEq/L (ref 19–32)
Calcium: 9.4 mg/dL (ref 8.4–10.5)
Chloride: 104 mEq/L (ref 96–112)
Creatinine, Ser: 0.7 mg/dL (ref 0.4–1.2)
GFR: 122.66 mL/min (ref >60.00)
Glucose, Bld: 98 mg/dL (ref 70–99)
Potassium: 3.8 mEq/L (ref 3.5–5.1)
Sodium: 138 mEq/L (ref 135–145)

## 2011-10-18 LAB — LIPID PANEL
Cholesterol: 181 mg/dL (ref 0–200)
HDL: 44.7 mg/dL (ref >39.00)
LDL Cholesterol: 113 mg/dL — ABNORMAL HIGH (ref 0–99)
Total CHOL/HDL Ratio: 4
Triglycerides: 117 mg/dL (ref 0.0–149.0)
VLDL: 23.4 mg/dL (ref 0.0–40.0)

## 2011-10-18 LAB — TSH: TSH: 1.9 u[IU]/mL (ref 0.35–5.50)

## 2011-10-18 MED ORDER — AMLODIPINE BESY-BENAZEPRIL HCL 10-20 MG PO CAPS: 1.0000 | ORAL_CAPSULE | Freq: Every day | ORAL | Status: DC

## 2011-10-18 MED ORDER — HYDROCHLOROTHIAZIDE 25 MG PO TABS: 25.0000 mg | ORAL_TABLET | Freq: Every day | ORAL | Status: DC

## 2011-10-18 NOTE — Progress Notes (Signed)
Subjective:     Makayla Good is a 41 y.o. female and is here for a comprehensive physical exam. The patient reports problems - pnd.  History   Social History  . Marital Status: Single    Spouse Name: N/A    Number of Children: N/A  . Years of Education: 13   Occupational History  . release liens [employer]   Social History Main Topics  . Smoking status: Current Some Day Smoker -- 0.1 packs/day for 7.5 years    Types: Cigarettes  . Smokeless tobacco: Not on file   Comment: smokeless cigarettes--would like to try chantix   . Alcohol Use: Yes  . Drug Use: No  . Sexually Active: Yes -- Female partner(s)     been with same person over 20 years   Other Topics Concern  . Not on file   Social History Narrative   Exercise--no   Health Maintenance  Topic Date Due  . Mammogram  11/10/2011  . Influenza Vaccine  01/22/2012  . Pap Smear  10/18/2014  . Tetanus/tdap  09/03/2020    The following portions of the patient's history were reviewed and updated as appropriate: allergies, current medications, past family history, past medical history, past social history, past surgical history and problem list.  Review of Systems Review of Systems  Constitutional: Negative for activity change, appetite change and fatigue.  HENT: Negative for hearing loss, congestion, tinnitus and ear discharge  + PND.  dentist due Eyes: Negative for visual disturbance (see optho q1y -- vision corrected to 20/20 with glasses).  Respiratory: Negative for cough, chest tightness and shortness of breath.   Cardiovascular: Negative for chest pain, palpitations and leg swelling.  Gastrointestinal: Negative for abdominal pain, diarrhea, constipation and abdominal distention.  Genitourinary: Negative for urgency, frequency, decreased urine volume and difficulty urinating.  Musculoskeletal: Negative for back pain, arthralgias and gait problem.  Skin: Negative for color change, pallor and rash.  Neurological:  Negative for dizziness, light-headedness, numbness and headaches.  Hematological: Negative for adenopathy. Does not bruise/bleed easily.  Psychiatric/Behavioral: Negative for suicidal ideas, confusion, sleep disturbance, self-injury, dysphoric mood, decreased concentration and agitation.       Objective:    BP 122/76  Pulse 106  Temp 98.7 F (37.1 C) (Oral)  Ht 5' 5.75" (1.67 m)  Wt 288 lb (130.636 kg)  BMI 46.84 kg/m2  SpO2 96%  LMP 09/28/2011 General appearance: alert, cooperative, appears stated age and no distress Head: Normocephalic, without obvious abnormality, atraumatic Eyes: conjunctivae/corneas clear. PERRL, EOM's intact. Fundi benign. Ears: normal TM's and external ear canals both ears Nose: Nares normal. Septum midline. Mucosa normal. No drainage or sinus tenderness. Throat: lips, mucosa, and tongue normal; teeth and gums normal Neck: no adenopathy, no carotid bruit, no JVD, supple, symmetrical, trachea midline and thyroid not enlarged, symmetric, no tenderness/mass/nodules Back: symmetric, no curvature. ROM normal. No CVA tenderness. Lungs: clear to auscultation bilaterally Breasts: normal appearance, no masses or tenderness Heart: regular rate and rhythm, S1, S2 normal, no murmur, click, rub or gallop Abdomen: soft, non-tender; bowel sounds normal; no masses,  no organomegaly Pelvic: cervix normal in appearance, external genitalia normal, no adnexal masses or tenderness, no cervical motion tenderness, rectovaginal septum normal, uterus normal size, shape, and consistency and vagina normal without discharge Extremities: extremities normal, atraumatic, no cyanosis or edema Pulses: 2+ and symmetric Skin: Skin color, texture, turgor normal. No rashes or lesions Lymph nodes: Cervical, supraclavicular, and axillary nodes normal. Neurologic: Alert and oriented X 3,  normal strength and tone. Normal symmetric reflexes. Normal coordination and gait psych-- no depression or  anxiety    Assessment:    Healthy female exam.      Plan:    ghm utd Check labs  See After Visit Summary for Counseling Recommendations

## 2011-10-18 NOTE — Assessment & Plan Note (Signed)
Pt interested in trying meds again for a few months---she will rto for eval  She did not want to wait today

## 2011-10-18 NOTE — Assessment & Plan Note (Signed)
Refill meds stable 

## 2011-10-18 NOTE — Patient Instructions (Signed)
Preventive Care for Adults, Female A healthy lifestyle and preventive care can promote health and wellness. Preventive health guidelines for women include the following key practices.  A routine yearly physical is a good way to check with your caregiver about your health and preventive screening. It is a chance to share any concerns and updates on your health, and to receive a thorough exam.   Visit your dentist for a routine exam and preventive care every 6 months. Brush your teeth twice a day and floss once a day. Good oral hygiene prevents tooth decay and gum disease.   The frequency of eye exams is based on your age, health, family medical history, use of contact lenses, and other factors. Follow your caregiver's recommendations for frequency of eye exams.   Eat a healthy diet. Foods like vegetables, fruits, whole grains, low-fat dairy products, and lean protein foods contain the nutrients you need without too many calories. Decrease your intake of foods high in solid fats, added sugars, and salt. Eat the right amount of calories for you.Get information about a proper diet from your caregiver, if necessary.   Regular physical exercise is one of the most important things you can do for your health. Most adults should get at least 150 minutes of moderate-intensity exercise (any activity that increases your heart rate and causes you to sweat) each week. In addition, most adults need muscle-strengthening exercises on 2 or more days a week.   Maintain a healthy weight. The body mass index (BMI) is a screening tool to identify possible weight problems. It provides an estimate of body fat based on height and weight. Your caregiver can help determine your BMI, and can help you achieve or maintain a healthy weight.For adults 20 years and older:   A BMI below 18.5 is considered underweight.   A BMI of 18.5 to 24.9 is normal.   A BMI of 25 to 29.9 is considered overweight.   A BMI of 30 and above is  considered obese.   Maintain normal blood lipids and cholesterol levels by exercising and minimizing your intake of saturated fat. Eat a balanced diet with plenty of fruit and vegetables. Blood tests for lipids and cholesterol should begin at age 20 and be repeated every 5 years. If your lipid or cholesterol levels are high, you are over 50, or you are at high risk for heart disease, you may need your cholesterol levels checked more frequently.Ongoing high lipid and cholesterol levels should be treated with medicines if diet and exercise are not effective.   If you smoke, find out from your caregiver how to quit. If you do not use tobacco, do not start.   If you are pregnant, do not drink alcohol. If you are breastfeeding, be very cautious about drinking alcohol. If you are not pregnant and choose to drink alcohol, do not exceed 1 drink per day. One drink is considered to be 12 ounces (355 mL) of beer, 5 ounces (148 mL) of wine, or 1.5 ounces (44 mL) of liquor.   Avoid use of street drugs. Do not share needles with anyone. Ask for help if you need support or instructions about stopping the use of drugs.   High blood pressure causes heart disease and increases the risk of stroke. Your blood pressure should be checked at least every 1 to 2 years. Ongoing high blood pressure should be treated with medicines if weight loss and exercise are not effective.   If you are 55 to 41   years old, ask your caregiver if you should take aspirin to prevent strokes.   Diabetes screening involves taking a blood sample to check your fasting blood sugar level. This should be done once every 3 years, after age 45, if you are within normal weight and without risk factors for diabetes. Testing should be considered at a younger age or be carried out more frequently if you are overweight and have at least 1 risk factor for diabetes.   Breast cancer screening is essential preventive care for women. You should practice "breast  self-awareness." This means understanding the normal appearance and feel of your breasts and may include breast self-examination. Any changes detected, no matter how small, should be reported to a caregiver. Women in their 20s and 30s should have a clinical breast exam (CBE) by a caregiver as part of a regular health exam every 1 to 3 years. After age 40, women should have a CBE every year. Starting at age 40, women should consider having a mammography (breast X-ray test) every year. Women who have a family history of breast cancer should talk to their caregiver about genetic screening. Women at a high risk of breast cancer should talk to their caregivers about having magnetic resonance imaging (MRI) and a mammography every year.   The Pap test is a screening test for cervical cancer. A Pap test can show cell changes on the cervix that might become cervical cancer if left untreated. A Pap test is a procedure in which cells are obtained and examined from the lower end of the uterus (cervix).   Women should have a Pap test starting at age 21.   Between ages 21 and 29, Pap tests should be repeated every 2 years.   Beginning at age 30, you should have a Pap test every 3 years as long as the past 3 Pap tests have been normal.   Some women have medical problems that increase the chance of getting cervical cancer. Talk to your caregiver about these problems. It is especially important to talk to your caregiver if a new problem develops soon after your last Pap test. In these cases, your caregiver may recommend more frequent screening and Pap tests.   The above recommendations are the same for women who have or have not gotten the vaccine for human papillomavirus (HPV).   If you had a hysterectomy for a problem that was not cancer or a condition that could lead to cancer, then you no longer need Pap tests. Even if you no longer need a Pap test, a regular exam is a good idea to make sure no other problems are  starting.   If you are between ages 65 and 70, and you have had normal Pap tests going back 10 years, you no longer need Pap tests. Even if you no longer need a Pap test, a regular exam is a good idea to make sure no other problems are starting.   If you have had past treatment for cervical cancer or a condition that could lead to cancer, you need Pap tests and screening for cancer for at least 20 years after your treatment.   If Pap tests have been discontinued, risk factors (such as a new sexual partner) need to be reassessed to determine if screening should be resumed.   The HPV test is an additional test that may be used for cervical cancer screening. The HPV test looks for the virus that can cause the cell changes on the cervix.   The cells collected during the Pap test can be tested for HPV. The HPV test could be used to screen women aged 30 years and older, and should be used in women of any age who have unclear Pap test results. After the age of 30, women should have HPV testing at the same frequency as a Pap test.   Colorectal cancer can be detected and often prevented. Most routine colorectal cancer screening begins at the age of 50 and continues through age 75. However, your caregiver may recommend screening at an earlier age if you have risk factors for colon cancer. On a yearly basis, your caregiver may provide home test kits to check for hidden blood in the stool. Use of a small camera at the end of a tube, to directly examine the colon (sigmoidoscopy or colonoscopy), can detect the earliest forms of colorectal cancer. Talk to your caregiver about this at age 50, when routine screening begins. Direct examination of the colon should be repeated every 5 to 10 years through age 75, unless early forms of pre-cancerous polyps or small growths are found.   Hepatitis C blood testing is recommended for all people born from 1945 through 1965 and any individual with known risks for hepatitis C.    Practice safe sex. Use condoms and avoid high-risk sexual practices to reduce the spread of sexually transmitted infections (STIs). STIs include gonorrhea, chlamydia, syphilis, trichomonas, herpes, HPV, and human immunodeficiency virus (HIV). Herpes, HIV, and HPV are viral illnesses that have no cure. They can result in disability, cancer, and death. Sexually active women aged 25 and younger should be checked for chlamydia. Older women with new or multiple partners should also be tested for chlamydia. Testing for other STIs is recommended if you are sexually active and at increased risk.   Osteoporosis is a disease in which the bones lose minerals and strength with aging. This can result in serious bone fractures. The risk of osteoporosis can be identified using a bone density scan. Women ages 65 and over and women at risk for fractures or osteoporosis should discuss screening with their caregivers. Ask your caregiver whether you should take a calcium supplement or vitamin D to reduce the rate of osteoporosis.   Menopause can be associated with physical symptoms and risks. Hormone replacement therapy is available to decrease symptoms and risks. You should talk to your caregiver about whether hormone replacement therapy is right for you.   Use sunscreen with sun protection factor (SPF) of 30 or more. Apply sunscreen liberally and repeatedly throughout the day. You should seek shade when your shadow is shorter than you. Protect yourself by wearing long sleeves, pants, a wide-brimmed hat, and sunglasses year round, whenever you are outdoors.   Once a month, do a whole body skin exam, using a mirror to look at the skin on your back. Notify your caregiver of new moles, moles that have irregular borders, moles that are larger than a pencil eraser, or moles that have changed in shape or color.   Stay current with required immunizations.   Influenza. You need a dose every fall (or winter). The composition of  the flu vaccine changes each year, so being vaccinated once is not enough.   Pneumococcal polysaccharide. You need 1 to 2 doses if you smoke cigarettes or if you have certain chronic medical conditions. You need 1 dose at age 65 (or older) if you have never been vaccinated.   Tetanus, diphtheria, pertussis (Tdap, Td). Get 1 dose of   Tdap vaccine if you are younger than age 65, are over 65 and have contact with an infant, are a healthcare worker, are pregnant, or simply want to be protected from whooping cough. After that, you need a Td booster dose every 10 years. Consult your caregiver if you have not had at least 3 tetanus and diphtheria-containing shots sometime in your life or have a deep or dirty wound.   HPV. You need this vaccine if you are a woman age 26 or younger. The vaccine is given in 3 doses over 6 months.   Measles, mumps, rubella (MMR). You need at least 1 dose of MMR if you were born in 1957 or later. You may also need a second dose.   Meningococcal. If you are age 19 to 21 and a first-year college student living in a residence hall, or have one of several medical conditions, you need to get vaccinated against meningococcal disease. You may also need additional booster doses.   Zoster (shingles). If you are age 60 or older, you should get this vaccine.   Varicella (chickenpox). If you have never had chickenpox or you were vaccinated but received only 1 dose, talk to your caregiver to find out if you need this vaccine.   Hepatitis A. You need this vaccine if you have a specific risk factor for hepatitis A virus infection or you simply wish to be protected from this disease. The vaccine is usually given as 2 doses, 6 to 18 months apart.   Hepatitis B. You need this vaccine if you have a specific risk factor for hepatitis B virus infection or you simply wish to be protected from this disease. The vaccine is given in 3 doses, usually over 6 months.  Preventive Services /  Frequency Ages 19 to 39  Blood pressure check.** / Every 1 to 2 years.   Lipid and cholesterol check.** / Every 5 years beginning at age 20.   Clinical breast exam.** / Every 3 years for women in their 20s and 30s.   Pap test.** / Every 2 years from ages 21 through 29. Every 3 years starting at age 30 through age 65 or 70 with a history of 3 consecutive normal Pap tests.   HPV screening.** / Every 3 years from ages 30 through ages 65 to 70 with a history of 3 consecutive normal Pap tests.   Hepatitis C blood test.** / For any individual with known risks for hepatitis C.   Skin self-exam. / Monthly.   Influenza immunization.** / Every year.   Pneumococcal polysaccharide immunization.** / 1 to 2 doses if you smoke cigarettes or if you have certain chronic medical conditions.   Tetanus, diphtheria, pertussis (Tdap, Td) immunization. / A one-time dose of Tdap vaccine. After that, you need a Td booster dose every 10 years.   HPV immunization. / 3 doses over 6 months, if you are 26 and younger.   Measles, mumps, rubella (MMR) immunization. / You need at least 1 dose of MMR if you were born in 1957 or later. You may also need a second dose.   Meningococcal immunization. / 1 dose if you are age 19 to 21 and a first-year college student living in a residence hall, or have one of several medical conditions, you need to get vaccinated against meningococcal disease. You may also need additional booster doses.   Varicella immunization.** / Consult your caregiver.   Hepatitis A immunization.** / Consult your caregiver. 2 doses, 6 to 18 months   apart.   Hepatitis B immunization.** / Consult your caregiver. 3 doses usually over 6 months.  Ages 40 to 64  Blood pressure check.** / Every 1 to 2 years.   Lipid and cholesterol check.** / Every 5 years beginning at age 20.   Clinical breast exam.** / Every year after age 40.   Mammogram.** / Every year beginning at age 40 and continuing for as  long as you are in good health. Consult with your caregiver.   Pap test.** / Every 3 years starting at age 30 through age 65 or 70 with a history of 3 consecutive normal Pap tests.   HPV screening.** / Every 3 years from ages 30 through ages 65 to 70 with a history of 3 consecutive normal Pap tests.   Fecal occult blood test (FOBT) of stool. / Every year beginning at age 50 and continuing until age 75. You may not need to do this test if you get a colonoscopy every 10 years.   Flexible sigmoidoscopy or colonoscopy.** / Every 5 years for a flexible sigmoidoscopy or every 10 years for a colonoscopy beginning at age 50 and continuing until age 75.   Hepatitis C blood test.** / For all people born from 1945 through 1965 and any individual with known risks for hepatitis C.   Skin self-exam. / Monthly.   Influenza immunization.** / Every year.   Pneumococcal polysaccharide immunization.** / 1 to 2 doses if you smoke cigarettes or if you have certain chronic medical conditions.   Tetanus, diphtheria, pertussis (Tdap, Td) immunization.** / A one-time dose of Tdap vaccine. After that, you need a Td booster dose every 10 years.   Measles, mumps, rubella (MMR) immunization. / You need at least 1 dose of MMR if you were born in 1957 or later. You may also need a second dose.   Varicella immunization.** / Consult your caregiver.   Meningococcal immunization.** / Consult your caregiver.   Hepatitis A immunization.** / Consult your caregiver. 2 doses, 6 to 18 months apart.   Hepatitis B immunization.** / Consult your caregiver. 3 doses, usually over 6 months.  Ages 65 and over  Blood pressure check.** / Every 1 to 2 years.   Lipid and cholesterol check.** / Every 5 years beginning at age 20.   Clinical breast exam.** / Every year after age 40.   Mammogram.** / Every year beginning at age 40 and continuing for as long as you are in good health. Consult with your caregiver.   Pap test.** /  Every 3 years starting at age 30 through age 65 or 70 with a 3 consecutive normal Pap tests. Testing can be stopped between 65 and 70 with 3 consecutive normal Pap tests and no abnormal Pap or HPV tests in the past 10 years.   HPV screening.** / Every 3 years from ages 30 through ages 65 or 70 with a history of 3 consecutive normal Pap tests. Testing can be stopped between 65 and 70 with 3 consecutive normal Pap tests and no abnormal Pap or HPV tests in the past 10 years.   Fecal occult blood test (FOBT) of stool. / Every year beginning at age 50 and continuing until age 75. You may not need to do this test if you get a colonoscopy every 10 years.   Flexible sigmoidoscopy or colonoscopy.** / Every 5 years for a flexible sigmoidoscopy or every 10 years for a colonoscopy beginning at age 50 and continuing until age 75.   Hepatitis   C blood test.** / For all people born from 1945 through 1965 and any individual with known risks for hepatitis C.   Osteoporosis screening.** / A one-time screening for women ages 65 and over and women at risk for fractures or osteoporosis.   Skin self-exam. / Monthly.   Influenza immunization.** / Every year.   Pneumococcal polysaccharide immunization.** / 1 dose at age 65 (or older) if you have never been vaccinated.   Tetanus, diphtheria, pertussis (Tdap, Td) immunization. / A one-time dose of Tdap vaccine if you are over 65 and have contact with an infant, are a healthcare worker, or simply want to be protected from whooping cough. After that, you need a Td booster dose every 10 years.   Varicella immunization.** / Consult your caregiver.   Meningococcal immunization.** / Consult your caregiver.   Hepatitis A immunization.** / Consult your caregiver. 2 doses, 6 to 18 months apart.   Hepatitis B immunization.** / Check with your caregiver. 3 doses, usually over 6 months.  ** Family history and personal history of risk and conditions may change your caregiver's  recommendations. Document Released: 06/05/2001 Document Revised: 03/29/2011 Document Reviewed: 09/04/2010 [employer] Patient Information 2012 [employer]. 

## 2011-10-18 NOTE — Assessment & Plan Note (Signed)
con't e cig 

## 2011-10-23 ENCOUNTER — Ambulatory Visit: Payer: Managed Care, Other (non HMO) | Admitting: Family Medicine

## 2011-10-30 ENCOUNTER — Ambulatory Visit (INDEPENDENT_AMBULATORY_CARE_PROVIDER_SITE_OTHER): Payer: Managed Care, Other (non HMO) | Admitting: Family Medicine

## 2011-10-30 ENCOUNTER — Encounter: Payer: Self-pay | Admitting: Family Medicine

## 2011-10-30 VITALS — BP 122/84 | HR 85 | Temp 98.6°F | Wt 290.0 lb

## 2011-10-30 DIAGNOSIS — E669 Obesity, unspecified: Secondary | ICD-10-CM

## 2011-10-30 DIAGNOSIS — I10 Essential (primary) hypertension: Secondary | ICD-10-CM

## 2011-10-30 MED ORDER — PHENTERMINE HCL 37.5 MG PO TABS: 37.5000 mg | ORAL_TABLET | Freq: Every day | ORAL | Status: DC

## 2011-10-30 NOTE — Assessment & Plan Note (Signed)
stable °

## 2011-10-30 NOTE — Progress Notes (Signed)
  Subjective:    Patient ID: Makayla Good, female    DOB: [birth year], 41 y.o.   MRN: [id number]  HPI  Pt here to discuss weight loss---  She would like to try the adipex again.  Pt knows she needs to exercise more and feels like she is eating healthy.  Review of Systems As above    Objective:   Physical Exam  Constitutional: She is oriented to person, place, and time. She appears well-developed and well-nourished.  Cardiovascular: Normal rate, regular rhythm and normal heart sounds.   No murmur heard. Pulmonary/Chest: Effort normal and breath sounds normal. No respiratory distress. She has no wheezes. She has no rales.  Musculoskeletal: She exhibits no edema and no tenderness.  Neurological: She is alert and oriented to person, place, and time.  Psychiatric: She has a normal mood and affect. Her behavior is normal. Judgment and thought content normal.          Assessment & Plan:

## 2011-10-30 NOTE — Patient Instructions (Signed)

## 2011-10-30 NOTE — Assessment & Plan Note (Signed)
D/w pt diet and exercise adipex  rto 2 weeks bp check Will try adipex 2-3 months

## 2011-11-12 ENCOUNTER — Ambulatory Visit (INDEPENDENT_AMBULATORY_CARE_PROVIDER_SITE_OTHER): Payer: Managed Care, Other (non HMO) | Admitting: Family Medicine

## 2011-11-12 ENCOUNTER — Ambulatory Visit
Admission: RE | Admit: 2011-11-12 | Discharge: 2011-11-12 | Disposition: A | Discharge status: Home / Self Care | Payer: Managed Care, Other (non HMO) | Source: Ambulatory Visit | Attending: Family Medicine | Admitting: Family Medicine

## 2011-11-12 ENCOUNTER — Encounter: Payer: Self-pay | Admitting: Family Medicine

## 2011-11-12 VITALS — BP 130/60 | HR 100 | Temp 98.8°F | Wt 280.0 lb

## 2011-11-12 DIAGNOSIS — I10 Essential (primary) hypertension: Secondary | ICD-10-CM

## 2011-11-12 DIAGNOSIS — Z1231 Encounter for screening mammogram for malignant neoplasm of breast: Secondary | ICD-10-CM

## 2011-11-12 DIAGNOSIS — E669 Obesity, unspecified: Secondary | ICD-10-CM

## 2011-11-12 NOTE — Patient Instructions (Signed)

## 2011-11-12 NOTE — Progress Notes (Signed)
  Subjective:    Patient here for follow-up of elevated blood pressure.  She is exercising and is adherent to a low-salt diet.  Blood pressure is well controlled at home. Cardiac symptoms: none. Patient denies: chest pain, chest pressure/discomfort, claudication, dyspnea, exertional chest pressure/discomfort, fatigue, irregular heart beat, lower extremity edema, near-syncope, orthopnea, palpitations, paroxysmal nocturnal dyspnea, syncope and tachypnea. Cardiovascular risk factors: hypertension and obesity (BMI >= 30 kg/m2). Use of agents associated with hypertension: amphetamines. History of target organ damage: none.  The following portions of the patient's history were reviewed and updated as appropriate: allergies, current medications, past family history, past medical history, past social history, past surgical history and problem list.  Review of Systems Pertinent items are noted in HPI.     Objective:    BP 130/60  Pulse 100  Temp 98.8 F (37.1 C) (Oral)  Wt 280 lb (127.007 kg)  SpO2 98%  LMP 09/28/2011 General appearance: alert, cooperative, appears stated age and no distress Neurologic: Alert and oriented X 3, normal strength and tone. Normal symmetric reflexes. Normal coordination and gait    Assessment:    Hypertension, normal blood pressure . Evidence of target organ damage: none.   obesity--- refill phenteramine-- rto 2 weeks Plan:    Medication: no change. Dietary sodium restriction. Regular aerobic exercise. Follow up: 2 weeks and as needed.

## 2011-11-26 ENCOUNTER — Encounter: Payer: Self-pay | Admitting: Family Medicine

## 2011-11-26 ENCOUNTER — Ambulatory Visit (INDEPENDENT_AMBULATORY_CARE_PROVIDER_SITE_OTHER): Payer: Managed Care, Other (non HMO) | Admitting: Family Medicine

## 2011-11-26 VITALS — BP 138/76 | HR 106 | Temp 98.6°F | Wt 278.6 lb

## 2011-11-26 DIAGNOSIS — I10 Essential (primary) hypertension: Secondary | ICD-10-CM

## 2011-11-26 DIAGNOSIS — E669 Obesity, unspecified: Secondary | ICD-10-CM

## 2011-11-26 MED ORDER — PHENTERMINE HCL 37.5 MG PO TABS: 37.5000 mg | ORAL_TABLET | Freq: Every day | ORAL | Status: DC

## 2011-11-26 NOTE — Progress Notes (Signed)
  Subjective:    Patient here for follow-up of elevated blood pressure.  She is not exercising and is adherent to a low-salt diet.  Blood pressure is well controlled at home. Cardiac symptoms: none. Patient denies: chest pain, chest pressure/discomfort, claudication, dyspnea, exertional chest pressure/discomfort, fatigue, irregular heart beat, lower extremity edema, near-syncope, orthopnea, palpitations, paroxysmal nocturnal dyspnea, syncope and tachypnea. Cardiovascular risk factors: hypertension, obesity (BMI >= 30 kg/m2) and sedentary lifestyle. Use of agents associated with hypertension: anorectics. History of target organ damage: none.  The following portions of the patient's history were reviewed and updated as appropriate: allergies, current medications, past family history, past medical history, past social history, past surgical history and problem list.  Review of Systems Pertinent items are noted in HPI.     Objective:    BP 138/76  Pulse 106  Temp 98.6 F (37 C) (Oral)  Wt 278 lb 9.6 oz (126.372 kg)  SpO2 97% General appearance: alert, cooperative, appears stated age and no distress Lungs: clear to auscultation bilaterally Heart: S1, S2 normal Extremities: extremities normal, atraumatic, no cyanosis or edema    Assessment:    Hypertension, normal blood pressure . Evidence of target organ damage: none.   Obesity-- refill phenteramine Plan:    Medication: no change. Dietary sodium restriction. Regular aerobic exercise. Check blood pressures 2-3 times weekly and record. Follow up: 1 month and as needed.

## 2011-11-26 NOTE — Patient Instructions (Addendum)

## 2011-12-28 ENCOUNTER — Ambulatory Visit (INDEPENDENT_AMBULATORY_CARE_PROVIDER_SITE_OTHER): Payer: Managed Care, Other (non HMO) | Admitting: Family Medicine

## 2011-12-28 ENCOUNTER — Encounter: Payer: Self-pay | Admitting: Family Medicine

## 2011-12-28 VITALS — BP 130/72 | HR 105 | Temp 99.0°F | Wt 268.2 lb

## 2011-12-28 DIAGNOSIS — J40 Bronchitis, not specified as acute or chronic: Secondary | ICD-10-CM

## 2011-12-28 DIAGNOSIS — E669 Obesity, unspecified: Secondary | ICD-10-CM

## 2011-12-28 MED ORDER — PHENTERMINE HCL 37.5 MG PO TABS: 37.5000 mg | ORAL_TABLET | Freq: Every day | ORAL | Status: DC

## 2011-12-28 MED ORDER — BECLOMETHASONE DIPROPIONATE 40 MCG/ACT IN AERS: 2.0000 | INHALATION_SPRAY | Freq: Two times a day (BID) | RESPIRATORY_TRACT | Status: DC

## 2011-12-28 MED ORDER — AZITHROMYCIN 250 MG PO TABS: ORAL_TABLET | ORAL | Status: AC

## 2011-12-28 NOTE — Patient Instructions (Addendum)

## 2011-12-28 NOTE — Progress Notes (Signed)
  Subjective:     Makayla Good is a 41 y.o. female here for evaluation of a cough. Onset of symptoms was 2 days ago. Symptoms have been gradually worsening since that time. The cough is dry and is aggravated by exercise, infection and stress. Associated symptoms include: shortness of breath and wheezing. Patient does have a history of asthma. Patient does have a history of environmental allergens. Patient has not traveled recently. Patient does not have a history of smoking. Patient has not had a previous chest x-ray. Patient has not had a PPD done.  The following portions of the patient's history were reviewed and updated as appropriate: allergies, current medications, past family history, past medical history, past social history, past surgical history and problem list.  Review of Systems Pertinent items are noted in HPI.    Objective:    Oxygen saturation 95% on room air BP 130/72  Pulse 105  Temp 99 F (37.2 C) (Oral)  Wt 268 lb 3.2 oz (121.655 kg)  SpO2 95% General appearance: alert, cooperative, appears stated age and no distress Ears: normal TM's and external ear canals both ears Nose: Nares normal. Septum midline. Mucosa normal. No drainage or sinus tenderness. Throat: lips, mucosa, and tongue normal; teeth and gums normal Neck: no adenopathy, supple, symmetrical, trachea midline and thyroid not enlarged, symmetric, no tenderness/mass/nodules Lungs: diminished breath sounds bilaterally and wheezes bilaterally Heart: S1, S2 normal Extremities: extremities normal, atraumatic, no cyanosis or edema    Assessment:    Acute Bronchitis   obesity-- great job!  con't with diet an exercise, refill med and rto 1 month Plan:    Explained lack of efficacy of antibiotics in viral disease. Antitussives per medication orders. Avoid exposure to tobacco smoke and fumes. B-agonist inhaler. Call if shortness of breath worsens, blood in sputum, change in character of cough, development of  fever or chills, inability to maintain nutrition and hydration. Avoid exposure to tobacco smoke and fumes. Steroid inhaler as ordered.

## 2012-01-16 ENCOUNTER — Encounter: Payer: Self-pay | Admitting: Family Medicine

## 2012-01-16 ENCOUNTER — Ambulatory Visit (INDEPENDENT_AMBULATORY_CARE_PROVIDER_SITE_OTHER): Payer: Managed Care, Other (non HMO) | Admitting: Family Medicine

## 2012-01-16 ENCOUNTER — Ambulatory Visit (HOSPITAL_BASED_OUTPATIENT_CLINIC_OR_DEPARTMENT_OTHER)
Admission: RE | Admit: 2012-01-16 | Discharge: 2012-01-16 | Disposition: A | Discharge status: Home / Self Care | Payer: Managed Care, Other (non HMO) | Source: Ambulatory Visit | Attending: Family Medicine | Admitting: Family Medicine

## 2012-01-16 VITALS — BP 152/96 | HR 100 | Temp 98.6°F | Wt 270.2 lb

## 2012-01-16 DIAGNOSIS — J40 Bronchitis, not specified as acute or chronic: Secondary | ICD-10-CM

## 2012-01-16 DIAGNOSIS — R05 Cough: Secondary | ICD-10-CM | POA: Insufficient documentation

## 2012-01-16 DIAGNOSIS — J45909 Unspecified asthma, uncomplicated: Secondary | ICD-10-CM | POA: Insufficient documentation

## 2012-01-16 DIAGNOSIS — R0989 Other specified symptoms and signs involving the circulatory and respiratory systems: Secondary | ICD-10-CM | POA: Insufficient documentation

## 2012-01-16 DIAGNOSIS — R059 Cough, unspecified: Secondary | ICD-10-CM | POA: Insufficient documentation

## 2012-01-16 DIAGNOSIS — I10 Essential (primary) hypertension: Secondary | ICD-10-CM | POA: Insufficient documentation

## 2012-01-16 MED ORDER — BECLOMETHASONE DIPROPIONATE 80 MCG/ACT IN AERS: 2.0000 | INHALATION_SPRAY | Freq: Two times a day (BID) | RESPIRATORY_TRACT | Status: DC

## 2012-01-16 NOTE — Patient Instructions (Addendum)

## 2012-01-17 ENCOUNTER — Encounter: Payer: Self-pay | Admitting: Family Medicine

## 2012-01-17 DIAGNOSIS — J40 Bronchitis, not specified as acute or chronic: Secondary | ICD-10-CM | POA: Insufficient documentation

## 2012-01-17 NOTE — Assessment & Plan Note (Signed)
Use inhalers Viral  abx if worsens or no better con't inhalers

## 2012-01-17 NOTE — Progress Notes (Signed)
  Subjective:     RHANDI KOFLER is a 41 y.o. female here for evaluation of a cough. Onset of symptoms was 3 days  ago. Symptoms have been gradually worsening since that time. The cough is productive and is aggravated by activity, pollen, infections, reclining.  Associated symptoms include: wheezing , sob Patient does not have a history of asthma. Patient does have a history of environmental allergens. Patient has not traveled recently. Patient does not have a history of smoking. Patient has not had a previous chest x-ray. Patient has not had a PPD done.  Review of Systems As above   Objective:     Filed Vitals:   01/16/12 1155  BP: 152/96  Pulse: 100  Temp: 98.6 F (37 C)  TempSrc: Oral  Weight: 270 lb 3.2 oz (122.562 kg)  SpO2: 97%   gen-- no AAOx3 heent--tmi normal          Nose normal          Throat --normal Lungs--  Dec resp,  + exp wheezing  cor  - s1s2 Ext-- no edema Assessment:    bronchitis---  prob viral   Plan:    con't inhalers Check xray Call or rto prn

## 2012-01-28 ENCOUNTER — Telehealth: Payer: Self-pay

## 2012-01-28 DIAGNOSIS — I10 Essential (primary) hypertension: Secondary | ICD-10-CM

## 2012-01-28 NOTE — Telephone Encounter (Signed)
Spoke with patient and she voiced understanding-- PFT's scheduled for this week and echo order put in.      [place]

## 2012-01-28 NOTE — Telephone Encounter (Signed)
Message copied by Arnette Norris on Mon Jan 28, 2012  4:26 PM ------      Message from: Lelon Perla      Created: Wed Jan 16, 2012  3:21 PM       No pneumonia      Min pulm congestion and atelectasis-----have her blow up about 10 balloons a day---this will fully inflate lungs      rto for PFTs      Stop adipex      Need 2d echo if not recently done  ---dx htn

## 2012-01-31 ENCOUNTER — Encounter: Payer: Self-pay | Admitting: Family Medicine

## 2012-01-31 ENCOUNTER — Ambulatory Visit (INDEPENDENT_AMBULATORY_CARE_PROVIDER_SITE_OTHER): Payer: Managed Care, Other (non HMO) | Admitting: Family Medicine

## 2012-01-31 VITALS — BP 130/72 | HR 97 | Temp 98.5°F | Wt 270.6 lb

## 2012-01-31 DIAGNOSIS — J40 Bronchitis, not specified as acute or chronic: Secondary | ICD-10-CM

## 2012-01-31 DIAGNOSIS — E785 Hyperlipidemia, unspecified: Secondary | ICD-10-CM

## 2012-01-31 LAB — HEPATIC FUNCTION PANEL
ALT: 25 U/L (ref 0–35)
AST: 20 U/L (ref 0–37)
Albumin: 3.6 g/dL (ref 3.5–5.2)
Alkaline Phosphatase: 60 U/L (ref 39–117)
Bilirubin, Direct: 0 mg/dL (ref 0.0–0.3)
Total Bilirubin: 0.6 mg/dL (ref 0.3–1.2)
Total Protein: 7.1 g/dL (ref 6.0–8.3)

## 2012-01-31 LAB — BASIC METABOLIC PANEL
BUN: 10 mg/dL (ref 6–23)
CO2: 27 mEq/L (ref 19–32)
Calcium: 9.2 mg/dL (ref 8.4–10.5)
Chloride: 102 mEq/L (ref 96–112)
Creatinine, Ser: 0.7 mg/dL (ref 0.4–1.2)
GFR: 111.1 mL/min (ref >60.00)
Glucose, Bld: 81 mg/dL (ref 70–99)
Potassium: 3.2 mEq/L — ABNORMAL LOW (ref 3.5–5.1)
Sodium: 137 mEq/L (ref 135–145)

## 2012-01-31 LAB — LIPID PANEL
Cholesterol: 165 mg/dL (ref 0–200)
HDL: 36.1 mg/dL — ABNORMAL LOW (ref >39.00)
LDL Cholesterol: 106 mg/dL — ABNORMAL HIGH (ref 0–99)
Total CHOL/HDL Ratio: 5
Triglycerides: 114 mg/dL (ref 0.0–149.0)
VLDL: 22.8 mg/dL (ref 0.0–40.0)

## 2012-01-31 NOTE — Patient Instructions (Signed)
Inactivated Influenza Vaccine What You Need to Know WHY GET VACCINATED?  Influenza ("flu") is a contagious disease.  It is caused by the influenza virus, which can be spread by coughing, sneezing, or nasal secretions.  Anyone can get influenza, but rates of infection are highest among children. For most people, symptoms last only a few days. They include:  Fever or chills.  Sore throat.  Muscle aches.  Fatigue.  Cough.  Headache.  Runny or stuffy nose. Other illnesses can have the same symptoms and are often mistaken for influenza. Young children, people 65 and older, pregnant women, and people with certain health conditions, such as heart, lung or kidney disease, or a weakened immune system can get much sicker. Flu can cause high fever and pneumonia, and make existing medical conditions worse. It can cause diarrhea and seizures in children. Each year thousands of people die from influenza and even more require hospitalization. By getting flu vaccine, you can protect yourself from influenza and may also avoid spreading influenza to others. INACTIVATED INFLUENZA VACCINE There are 2 types of influenza vaccine:  Inactivated (killed) vaccine, the "flu shot", is given by injection with a needle.  Live, attenuated (weakened) influenza vaccine is sprayed into the nostrils. This vaccine is described in a separate Vaccine Information Statement. A "high-dose" inactivated influenza vaccine is available for people 65 years of age and older. Ask your doctor for more information.  Influenza viruses are always changing, so annual vaccination is recommended. Each year scientists try to match the viruses in the vaccine to those most likely to cause flu that year. Flu vaccine will not prevent disease from other viruses, including flu viruses not contained in the vaccine. It takes up to 2 weeks for protection to develop after the shot. Protection lasts about 1 year. Some inactivated influenza vaccine  contains a preservative called thimerosal. Thimerosal-free influenza vaccine is available. Ask your doctor for more information. WHO SHOULD GET INACTIVATED INFLUENZA VACCINE AND WHEN? WHO  All people 6 months of age and older should get flu vaccine.  Vaccination is especially important for people at higher risk of severe influenza and their close contacts, including healthcare personnel and close contacts of children younger than 6 months. WHEN Getting the vaccine as soon as it is available. This should provide protection if the flu season comes early. You can get the vaccine as long as illness is occurring in your community. Influenza can occur at any time, but most influenza occurs from October through May. In recent seasons, most infections have occurred in January and February. Getting vaccinated in December, or even later, will still be beneficial in most years. Adults and older children need 1 dose of influenza vaccine each year. But some children younger than 9 years of age need 2 doses to be protected. Ask your doctor. Influenza vaccine may be given at the same time as other vaccines, including pneumococcal vaccine. SOME PEOPLE SHOULD NOT GET INACTIVATED INFLUENZA VACCINE OR SHOULD WAIT  Tell your doctor if you have any severe (life-threatening) allergies, including a severe allergy to eggs. A severe allergy to any vaccine component may be a reason not to get the vaccine. Allergic reactions to influenza vaccine are rare.  Tell your doctor if you ever had a severe reaction after a dose of influenza vaccine.  Tell your doctor if you ever had Guillain-Barr syndrome (a severe paralytic illness, also called GBS). Your doctor will help you decide whether the vaccine is recommended for you.  People who are   moderately or severely ill should usually wait until they recover before getting the flu vaccine. If you are ill, talk to your doctor about whether to reschedule the vaccination. People with  a mild illness can usually get the vaccine. WHAT ARE THE RISKS FROM INACTIVATED INFLUENZA VACCINE? A vaccine, like any medicine, could possibly cause serious problems, such as severe allergic reactions. The risk of a vaccine causing serious harm, or death, is extremely small. Serious problems from inactivated influenza vaccine are very rare. The viruses in inactivated influenza vaccine have been killed, so you cannot get influenza from the vaccine. Mild problems:  Soreness, redness, or swelling where the shot was given.  Hoarseness; sore, red or itchy eyes; cough.  Fever.  Aches.  Headache.  Itching.  Fatigue. If these problems occur, they usually begin soon after the shot and last 1 to 2 days. Moderate problems: Young children who get inactivated flu vaccine and pneumococcal vaccine (PCV13) at the same time appear to be at increased risk for seizures caused by fever. Ask your doctor for more information. Tell your doctor if a child who is getting flu vaccine has ever had a seizure. Severe problems:  Life-threatening allergic reactions from vaccines are very rare. If they do occur, it is usually within a few minutes to a few hours after the shot.  In 1976, a type of inactivated influenza (swine flu) vaccine was associated with Guillan-Barr syndrome (GBS). Since then, flu vaccines have not been clearly linked to GBS. However, if there is a risk of GBS from current flu vaccines, it would be no more than 1 or 2 cases per million people vaccinated. This is much lower than the risk of severe influenza, which can be prevented by vaccination. The safety of vaccines is always being monitored. For more information, visit:  www.cdc.gov/vaccinesafety/Vaccine_Monitoring/Index.html and  www.cdc.gov/vaccinesafety/Activities/Activities_Index.html One brand of inactivated flu vaccine, called Afluria, should not be given to children 8 years of age or younger, except in special circumstances. A  related vaccine was associated with fevers and fever-related seizures in young children in [place]. Your doctor can give you more information. WHAT IF THERE IS A SEVERE REACTION? What should I look for? Any unusual condition, such as a high fever or unusual behavior. Signs of a serious allergic reaction can include difficulty breathing, hoarseness or wheezing, hives, paleness, weakness, a fast heartbeat, or dizziness. What should I do?  Call a doctor, or get the person to a doctor right away.  Tell your doctor what happened, the date and time it happened, and when the vaccination was given.  Ask your doctor, nurse, or health department to report the reaction by filing a Vaccine Adverse Event Reporting System (VAERS) form. Or, you can file this report through the VAERS website at www.vaers.hhs.gov or by calling 1-800-822-7967. VAERS does not provide medical advice. THE NATIONAL VACCINE INJURY COMPENSATION PROGRAM The National Vaccine Injury Compensation Program (VICP) was created in 1986. People who believe they may have been injured by a vaccine can learn about the program and about filing a claim by calling 1-800-338-2382, or visiting the VICP website at www.hrsa.gov/vaccinecompensation HOW CAN I LEARN MORE?  Ask your doctor. They can give you the vaccine package insert or suggest other sources of information.  Call your local or state health department.  Contact the Centers for Disease Control and Prevention (CDC):  Call 1-800-232-4636 (1-800-CDC-INFO) or  Visit the CDC's website at www.cdc.gov/flu CDC Inactivated Influenza Vaccine VIS (10/23/10) Document Released: 02/01/2006 Document Revised: 10/09/2011 Document Reviewed:   10/23/2010 Exit[employer] Patient Information 2013 [employer].  

## 2012-01-31 NOTE — Progress Notes (Signed)
  Subjective:    Patient ID: Makayla Good, female    DOB: Nov 05, 1970, 41 y.o.   MRN: [id number]  HPI Pt is here f/u bronchitis and get pft.  She is feeling much better.  No complaints.   Review of Systems As above    Objective:   Physical Exam  Constitutional: She is oriented to person, place, and time. She appears well-developed and well-nourished.  Cardiovascular: Normal rate, regular rhythm and normal heart sounds.   Pulmonary/Chest: Effort normal and breath sounds normal. No respiratory distress. She has no wheezes. She has no rales.  Neurological: She is alert and oriented to person, place, and time.  Psychiatric: She has a normal mood and affect. Her behavior is normal. Judgment and thought content normal.          Assessment & Plan:

## 2012-01-31 NOTE — Assessment & Plan Note (Signed)
Resolved pft normal

## 2012-02-01 ENCOUNTER — Ambulatory Visit (HOSPITAL_COMMUNITY): Payer: Managed Care, Other (non HMO) | Attending: Family Medicine | Admitting: Radiology

## 2012-02-01 DIAGNOSIS — I10 Essential (primary) hypertension: Secondary | ICD-10-CM

## 2012-02-01 DIAGNOSIS — I059 Rheumatic mitral valve disease, unspecified: Secondary | ICD-10-CM | POA: Insufficient documentation

## 2012-02-01 DIAGNOSIS — J45909 Unspecified asthma, uncomplicated: Secondary | ICD-10-CM | POA: Insufficient documentation

## 2012-02-01 DIAGNOSIS — F17200 Nicotine dependence, unspecified, uncomplicated: Secondary | ICD-10-CM | POA: Insufficient documentation

## 2012-02-01 DIAGNOSIS — I517 Cardiomegaly: Secondary | ICD-10-CM

## 2012-02-01 DIAGNOSIS — I369 Nonrheumatic tricuspid valve disorder, unspecified: Secondary | ICD-10-CM | POA: Insufficient documentation

## 2012-02-01 NOTE — Progress Notes (Signed)
Echocardiogram performed.  

## 2012-03-04 ENCOUNTER — Encounter: Payer: Self-pay | Admitting: Family Medicine

## 2012-03-04 ENCOUNTER — Ambulatory Visit (INDEPENDENT_AMBULATORY_CARE_PROVIDER_SITE_OTHER): Payer: Managed Care, Other (non HMO) | Admitting: Family Medicine

## 2012-03-04 VITALS — BP 142/88 | HR 99 | Temp 98.8°F | Wt 271.6 lb

## 2012-03-04 DIAGNOSIS — J45909 Unspecified asthma, uncomplicated: Secondary | ICD-10-CM

## 2012-03-04 DIAGNOSIS — R062 Wheezing: Secondary | ICD-10-CM

## 2012-03-04 DIAGNOSIS — J309 Allergic rhinitis, unspecified: Secondary | ICD-10-CM

## 2012-03-04 DIAGNOSIS — J302 Other seasonal allergic rhinitis: Secondary | ICD-10-CM

## 2012-03-04 DIAGNOSIS — J40 Bronchitis, not specified as acute or chronic: Secondary | ICD-10-CM

## 2012-03-04 MED ORDER — ALBUTEROL SULFATE (5 MG/ML) 0.5% IN NEBU
2.5000 mg | INHALATION_SOLUTION | Freq: Once | RESPIRATORY_TRACT | Status: AC
  Administered 2012-03-04: 2.5 mg via RESPIRATORY_TRACT

## 2012-03-04 MED ORDER — PREDNISONE 10 MG PO TABS: ORAL_TABLET | ORAL | Status: DC

## 2012-03-04 MED ORDER — CETIRIZINE HCL 10 MG PO TABS: 10.0000 mg | ORAL_TABLET | Freq: Every day | ORAL | Status: DC

## 2012-03-04 MED ORDER — MOMETASONE FUROATE 50 MCG/ACT NA SUSP: 2.0000 | Freq: Every day | NASAL | Status: DC

## 2012-03-04 MED ORDER — AZITHROMYCIN 250 MG PO TABS: ORAL_TABLET | ORAL | Status: DC

## 2012-03-04 NOTE — Patient Instructions (Addendum)

## 2012-03-04 NOTE — Progress Notes (Signed)
  Subjective:     Makayla Good is an 41 y.o. female who presents for follow up of asthma. The patient is currently having symptoms / an exacerbation. Current symptoms include chest tightness, dyspnea, productive cough and wheezing. Symptoms have been present since a week ago and have been gradually worsening. She denies non-productive cough. Associated symptoms include poor exercise tolerance, shortness of breath and wheezing.  This episode appears to have been triggered by cold air and pollens. Treatments tried for the current exacerbation include inhaled corticosteroids, short-acting inhaled beta-adrenergic agonists and systemic corticosteroids, which have provided some relief of symptoms. This is the patients 3rd visit in 2 months. Current Disease Severity Makayla Good has frequent daytime asthma symptoms. She has frequent nighttime asthma symptoms. The patient is using short-acting beta agonists for symptom control daily.   Current limitations in activity from asthma: none. Number of days of school or work missed in the last month: 0. Number of urgent/emergent visit in last year: 0   The following portions of the patient's history were reviewed and updated as appropriate: allergies, current medications, past family history, past medical history, past social history, past surgical history and problem list.  Review of Systems Pertinent items are noted in HPI.    Objective:    Oxygen saturation 96% on room air BP 142/88  Pulse 99  Temp 98.8 F (37.1 C) (Oral)  Wt 271 lb 9.6 oz (123.197 kg)  SpO2 96% General appearance: alert, cooperative, appears stated age and mild distress Ears: normal TM's and external ear canals both ears Nose: green discharge, mild congestion, turbinates red, swollen, sinus tenderness bilateral Throat: abnormal findings: pnd Neck: mild anterior cervical adenopathy, supple, symmetrical, trachea midline and thyroid not enlarged, symmetric, no  tenderness/mass/nodules Lungs: rhonchi bilaterally and wheezes bilaterally Heart: S1, S2 normal Extremities: extremities normal, atraumatic, no cyanosis or edema    Assessment:    Mild persistent asthma, ongoing.  ----  With acute exacerbation , bronchitis   Plan:    Medications: no change. Beta-agonist nebulizer treatment given in the office with good relief of symptoms. Discussed medication dosage, use, side effects, and goals of treatment in detail.   Asthma information handout given. Referral to asthma specialist --pulmonary pred taper and z pack

## 2012-03-05 ENCOUNTER — Encounter: Payer: Self-pay | Admitting: Pulmonary Disease

## 2012-03-05 ENCOUNTER — Ambulatory Visit (INDEPENDENT_AMBULATORY_CARE_PROVIDER_SITE_OTHER): Payer: Managed Care, Other (non HMO) | Admitting: Pulmonary Disease

## 2012-03-05 VITALS — BP 126/84 | HR 101 | Temp 98.1°F | Ht 65.0 in | Wt 273.0 lb

## 2012-03-05 DIAGNOSIS — R05 Cough: Secondary | ICD-10-CM | POA: Insufficient documentation

## 2012-03-05 DIAGNOSIS — R059 Cough, unspecified: Secondary | ICD-10-CM | POA: Insufficient documentation

## 2012-03-05 NOTE — Assessment & Plan Note (Signed)
The patient is having persistent cough and congestion along with some shortness of breath, and has a history of asthma that dates back to her teenage years.  However, she has had normal spirometry in the face of active symptoms, and therefore this is not related to asthma.  Her audible wheezing sounds most consistent with upper airway pseudo-wheezing, and her cough also sounds more upper airway as well.  She has 3 factors that could be contributing to this, including postnasal drip, ACE inhibitor, as well as ongoing smoking.  She denies reflux disease, but is obese and at risk for laryngopharyngeal reflux.  At this point, I would like to discontinue her prednisone and inhaled corticosteroids, and treat her for upper airway sources of cough and congestion.  Will start her on an antihistamine for her postnasal drip, and I also think she needs to stop her Lotrel.  I will send a note to Dr. Laury Axon to come up with alternatives.

## 2012-03-05 NOTE — Patient Instructions (Addendum)
Stop all qvar, and stop prednisone. Can use albuterol for rescue only. Take chlorpheniramine 4mg  otc  2 at bedtime and one at lunch for next 3 weeks. I will send a note to Dr. Laury Axon recommending that you come off lotrel for next 3-4 weeks.  If you do not hear from her by Friday am, let us know Stop smoking.  This can irritate your upper airway!! followup with me in 3 weeks to check on your progress, but call us if you are getting worse.

## 2012-03-05 NOTE — Progress Notes (Signed)
  Subjective:    Patient ID: Makayla Good, female    DOB: Apr 11, 1971, 41 y.o.   MRN: [id number]  HPI The patient is a 41 year old female who been asked to see for persistent cough and congestion.  The patient has a history of asthma diagnosed in her teenage years, but has never required maintenance medications.  She  Has had increasing cough recently that is primarily dry, but will produce at times a thumb nail size piece of mucus that is nonpurulent.  She is also heard audible wheezing, and has had some increased shortness of breath.  She has had a recent chest x-ray that showed cardiomegaly but no acute process.  She is also had spirometry last month that was totally normal despite having active symptoms.  She has been started on inhaled corticosteroids, but unfortunately continues to have her symptoms.  It should be noted that she is currently smoking, and has also been on an ACE inhibitor for the last 6 months.  She also has had significant nasal congestion and postnasal drip that exacerbates her symptoms.   Review of Systems  Constitutional: Negative for fever and unexpected weight change.  HENT: Positive for congestion, rhinorrhea and postnasal drip. Negative for ear pain, nosebleeds, sore throat, sneezing, trouble swallowing, dental problem and sinus pressure.   Eyes: Negative for redness and itching.  Respiratory: Positive for cough, chest tightness, shortness of breath and wheezing.   Cardiovascular: Positive for leg swelling. Negative for palpitations.  Gastrointestinal: Negative for nausea and vomiting.  Genitourinary: Negative for dysuria.  Musculoskeletal: Negative for joint swelling.  Skin: Negative for rash.  Neurological: Negative for headaches.  Hematological: Does not bruise/bleed easily.  Psychiatric/Behavioral: Negative for dysphoric mood. The patient is not nervous/anxious.        Objective:   Physical Exam Constitutional:  Obese female, no acute distress  HENT:   Nares patent without discharge  Oropharynx without exudate, palate and uvula are normal  Eyes:  Perrla, eomi, no scleral icterus  Neck:  No JVD, no TMG  Cardiovascular:  Normal rate, regular rhythm, no rubs or gallops.  No murmurs        Intact distal pulses  Pulmonary :  Normal breath sounds, no stridor or respiratory distress   No rales, rhonchi, or wheezing  Abdominal:  Soft, nondistended, bowel sounds present.  No tenderness noted.   Musculoskeletal:  2+ lower extremity edema noted.  Lymph Nodes:  No cervical lymphadenopathy noted  Skin:  No cyanosis noted  Neurologic:  Alert, appropriate, moves all 4 extremities without obvious deficit.         Assessment & Plan:

## 2012-03-06 ENCOUNTER — Telehealth: Payer: Self-pay | Admitting: *Deleted

## 2012-03-06 MED ORDER — NEBIVOLOL HCL 5 MG PO TABS: 5.0000 mg | ORAL_TABLET | Freq: Every day | ORAL | Status: DC

## 2012-03-06 MED ORDER — AMLODIPINE BESYLATE 10 MG PO TABS: 10.0000 mg | ORAL_TABLET | Freq: Every day | ORAL | Status: DC

## 2012-03-06 NOTE — Telephone Encounter (Signed)
Pt was seen by pulmonary on yesterday and they advise that she come off lotrel for next 3-4 weeks. Pt would like to know if this is ok and if there is something else you would like for her to take in it place.

## 2012-03-06 NOTE — Telephone Encounter (Signed)
Go back to norvasc 10 mg 330  1 po qd and give samples of bystolic 5 mg #30  1 po qd ---ov 2-3 weeks

## 2012-03-06 NOTE — Telephone Encounter (Signed)
Discussed with patient and she voiced understanding. She will come by the office to pick up the samples of the Bystolic and schedule an apt.     KP

## 2012-03-26 ENCOUNTER — Encounter: Payer: Self-pay | Admitting: Pulmonary Disease

## 2012-03-26 ENCOUNTER — Ambulatory Visit (INDEPENDENT_AMBULATORY_CARE_PROVIDER_SITE_OTHER): Payer: Managed Care, Other (non HMO) | Admitting: Pulmonary Disease

## 2012-03-26 VITALS — BP 118/82 | HR 86 | Temp 98.4°F | Ht 65.0 in | Wt 276.8 lb

## 2012-03-26 DIAGNOSIS — R05 Cough: Secondary | ICD-10-CM

## 2012-03-26 DIAGNOSIS — R059 Cough, unspecified: Secondary | ICD-10-CM

## 2012-03-26 NOTE — Assessment & Plan Note (Signed)
The patient's cough has almost totally resolved, and I suspect that it was multifactorial.  However the ACE inhibitor was more than likely the main culprit.  I have asked her to stay off all inhalers, to take her antihistamine as needed, and to work on total smoking cessation.  I would also recommend that she avoid ACE inhibitors given her propensity for a chronic cough.

## 2012-03-26 NOTE — Progress Notes (Signed)
  Subjective:    Patient ID: Makayla Good, female    DOB: 1970-11-19, 41 y.o.   MRN: [id number]  HPI Patient comes in today for followup of her chronic cough.  At the last visit, this was felt to be upper airway in origin, and probably related to postnasal drip, her ongoing smoking, and also ACE inhibitor.  Her blood pressure medicine has been changed, and she was treated with an antihistamine for postnasal drip.  She was taken off all of her inhalers since her spirometry showed no airflow obstruction.  She comes in today were her cough is at least 80-90% improved.  She is also to cut back on her smoking, and is going to try to totally quit.   Review of Systems  Constitutional: Negative for fever and unexpected weight change.  HENT: Negative for ear pain, nosebleeds, congestion, sore throat, rhinorrhea, sneezing, trouble swallowing, dental problem, postnasal drip and sinus pressure.   Eyes: Negative for redness and itching.  Respiratory: Negative for cough, chest tightness, shortness of breath and wheezing.   Cardiovascular: Negative for palpitations and leg swelling.  Gastrointestinal: Negative for nausea and vomiting.  Genitourinary: Negative for dysuria.  Musculoskeletal: Negative for joint swelling.  Skin: Negative for rash.  Neurological: Negative for headaches.  Hematological: Does not bruise/bleed easily.  Psychiatric/Behavioral: Negative for dysphoric mood. The patient is not nervous/anxious.        Objective:   Physical Exam Obese female in no acute distress Nose without purulence or discharge noted Neck without lymphadenopathy or thyromegaly Chest clear to auscultation, no wheezing Cardiac exam is regular rate and rhythm       Assessment & Plan:

## 2012-03-26 NOTE — Patient Instructions (Addendum)
Stop albuterol Can take your antihistamine as needed. Work on total smoking cessation Would stay off the class of drugs for blood pressure known as ACE inhibitors. followup with me as needed.

## 2012-04-02 ENCOUNTER — Other Ambulatory Visit: Payer: Self-pay | Admitting: *Deleted

## 2012-04-02 DIAGNOSIS — I10 Essential (primary) hypertension: Secondary | ICD-10-CM

## 2012-04-02 MED ORDER — HYDROCHLOROTHIAZIDE 25 MG PO TABS: 25.0000 mg | ORAL_TABLET | Freq: Every day | ORAL | Status: DC

## 2012-04-02 MED ORDER — NEBIVOLOL HCL 5 MG PO TABS: 5.0000 mg | ORAL_TABLET | Freq: Every day | ORAL | Status: DC

## 2012-04-02 MED ORDER — AMLODIPINE BESYLATE 10 MG PO TABS: 10.0000 mg | ORAL_TABLET | Freq: Every day | ORAL | Status: DC

## 2012-04-02 NOTE — Telephone Encounter (Signed)
Refills for HCTZ, Bystolic and Amoldipine sent to pts pharmacy, pt notified. SGJ, RN

## 2012-05-11 ENCOUNTER — Other Ambulatory Visit: Payer: Self-pay | Admitting: Family Medicine

## 2012-06-04 ENCOUNTER — Encounter: Payer: Self-pay | Admitting: Family Medicine

## 2012-06-04 ENCOUNTER — Ambulatory Visit (INDEPENDENT_AMBULATORY_CARE_PROVIDER_SITE_OTHER): Payer: Managed Care, Other (non HMO) | Admitting: Family Medicine

## 2012-06-04 VITALS — BP 136/84 | HR 98 | Temp 98.5°F | Wt 277.2 lb

## 2012-06-04 DIAGNOSIS — IMO0002 Reserved for concepts with insufficient information to code with codable children: Secondary | ICD-10-CM

## 2012-06-04 DIAGNOSIS — M25569 Pain in unspecified knee: Secondary | ICD-10-CM

## 2012-06-04 DIAGNOSIS — M1710 Unilateral primary osteoarthritis, unspecified knee: Secondary | ICD-10-CM

## 2012-06-04 DIAGNOSIS — M179 Osteoarthritis of knee, unspecified: Secondary | ICD-10-CM | POA: Insufficient documentation

## 2012-06-04 MED ORDER — MELOXICAM 15 MG PO TABS: 15.0000 mg | ORAL_TABLET | Freq: Every day | ORAL | Status: AC

## 2012-06-04 NOTE — Patient Instructions (Signed)
Osteoarthritis Osteoarthritis is the most common form of arthritis. It is redness, soreness, and swelling (inflammation) affecting the cartilage. Cartilage acts as a cushion, covering the ends of bones where they meet to form a joint. CAUSES  Over time, the cartilage begins to wear away. This causes bone to rub on bone. This produces pain and stiffness in the affected joints. Factors that contribute to this problem are:  Excessive body weight.  Age.  Overuse of joints. SYMPTOMS   People with osteoarthritis usually experience joint pain, swelling, or stiffness.  Over time, the joint may lose its normal shape.  Small deposits of bone (osteophytes) may grow on the edges of the joint.  Bits of bone or cartilage can break off and float inside the joint space. This may cause more pain and damage.  Osteoarthritis can lead to depression, anxiety, feelings of helplessness, and limitations on daily activities. The most commonly affected joints are in the:  Ends of the fingers.  Thumbs.  Neck.  Lower back.  Knees.  Hips. DIAGNOSIS  Diagnosis is mostly based on your symptoms and exam. Tests may be helpful, including:  X-rays of the affected joint.  A computerized magnetic scan (MRI).  Blood tests to rule out other types of arthritis.  Joint fluid tests. This involves using a needle to draw fluid from the joint and examining the fluid under a microscope. TREATMENT  Goals of treatment are to control pain, improve joint function, maintain a normal body weight, and maintain a healthy lifestyle. Treatment approaches may include:  A prescribed exercise program with rest and joint relief.  Weight control with nutritional education.  Pain relief techniques such as:  Properly applied heat and cold.  Electric pulses delivered to nerve endings under the skin (transcutaneous electrical nerve stimulation, TENS).  Massage.  Certain supplements. Ask your caregiver before using any  supplements, especially in combination with prescribed drugs.  Medicines to control pain, such as:  Acetaminophen.  Nonsteroidal anti-inflammatory drugs (NSAIDs), such as naproxen.  Narcotic or central-acting agents, such as tramadol. This drug carries a risk of addiction and is generally prescribed for short-term use.  Corticosteroids. These can be given orally or as injection. This is a short-term treatment, not recommended for routine use.  Surgery to reposition the bones and relieve pain (osteotomy) or to remove loose pieces of bone and cartilage. Joint replacement may be needed in advanced states of osteoarthritis. HOME CARE INSTRUCTIONS  Your caregiver can recommend specific types of exercise. These may include:  Strengthening exercises. These are done to strengthen the muscles that support joints affected by arthritis. They can be performed with weights or with exercise bands to add resistance.  Aerobic activities. These are exercises, such as brisk walking or low-impact aerobics, that get your heart pumping. They can help keep your lungs and circulatory system in shape.  Range-of-motion activities. These keep your joints limber.  Balance and agility exercises. These help you maintain daily living skills. Learning about your condition and being actively involved in your care will help improve the course of your osteoarthritis. SEEK MEDICAL CARE IF:   You feel hot or your skin turns red.  You develop a rash in addition to your joint pain.  You have an oral temperature above 102 F (38.9 C). FOR MORE INFORMATION  National Institute of Arthritis and Musculoskeletal and Skin Diseases: www.niams.nih.gov National Institute on Aging: www.nia.nih.gov American College of Rheumatology: www.rheumatology.org Document Released: 04/09/2005 Document Revised: 07/02/2011 Document Reviewed: 07/21/2009 [employer] Patient Information 2013 [employer].  

## 2012-06-04 NOTE — Progress Notes (Signed)
  Subjective:    Patient ID: Makayla Good, female    DOB: January 22, 1971, 42 y.o.   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   They are better.  She has had visit with ortho and had injections but the meloxicam helps with pain / inflammation.     Review of Systems As above    Objective:   Physical Exam  BP 136/84  Pulse 98  Temp(Src) 98.5 F (36.9 C) (Oral)  Wt 277 lb 3.2 oz (125.737 kg)  BMI 46.13 kg/m2  SpO2 95% General appearance: alert, cooperative, appears stated age and no distress Extremities: + crepitus in both knees,  no swelling      Assessment & Plan:

## 2012-06-04 NOTE — Assessment & Plan Note (Signed)
mobic refilled rto prn or f/u ortho

## 2012-06-19 ENCOUNTER — Ambulatory Visit: Payer: Managed Care, Other (non HMO) | Admitting: *Deleted

## 2012-06-20 ENCOUNTER — Ambulatory Visit: Payer: Managed Care, Other (non HMO)

## 2012-08-06 ENCOUNTER — Other Ambulatory Visit: Payer: Self-pay | Admitting: Family Medicine

## 2012-08-18 ENCOUNTER — Ambulatory Visit (INDEPENDENT_AMBULATORY_CARE_PROVIDER_SITE_OTHER): Payer: Managed Care, Other (non HMO) | Admitting: Family Medicine

## 2012-08-18 ENCOUNTER — Encounter: Payer: Self-pay | Admitting: Family Medicine

## 2012-08-18 DIAGNOSIS — I10 Essential (primary) hypertension: Secondary | ICD-10-CM

## 2012-08-18 NOTE — Patient Instructions (Addendum)

## 2012-08-18 NOTE — Assessment & Plan Note (Signed)
Discussed diet and exercise Refer to nutrition

## 2012-08-18 NOTE — Progress Notes (Signed)
  Subjective:    Patient here for follow-up of elevated blood pressure.  She is exercising and is adherent to a low-salt diet.  Blood pressure is well controlled at home. Cardiac symptoms: none. Patient denies: chest pain, chest pressure/discomfort, claudication, dyspnea, exertional chest pressure/discomfort, fatigue, irregular heart beat, lower extremity edema, near-syncope, orthopnea, palpitations, paroxysmal nocturnal dyspnea, syncope and tachypnea. Cardiovascular risk factors: hypertension, obesity (BMI >= 30 kg/m2) and sedentary lifestyle. Use of agents associated with hypertension: none. History of target organ damage: none.  The following portions of the patient's history were reviewed and updated as appropriate: allergies, current medications, past family history, past medical history, past social history, past surgical history and problem list.  Review of Systems Pertinent items are noted in HPI.     Objective:    BP 128/84  Pulse 97  Temp(Src) 98.9 F (37.2 C) (Oral)  Wt 277 lb 12.8 oz (126.009 kg)  BMI 46.23 kg/m2  SpO2 95% General appearance: alert, cooperative, appears stated age and no distress Neck: no adenopathy, no carotid bruit, no JVD, supple, symmetrical, trachea midline and thyroid not enlarged, symmetric, no tenderness/mass/nodules Lungs: clear to auscultation bilaterally Heart: S1, S2 normal    Assessment:    Hypertension, normal blood pressure . Evidence of target organ damage: none.    Plan:    Medication: no change. Dietary sodium restriction. Regular aerobic exercise. Check blood pressures 2-3 times weekly and record. Follow up: 6 months and as needed.

## 2012-09-26 ENCOUNTER — Encounter: Payer: Managed Care, Other (non HMO) | Attending: Family Medicine | Admitting: Dietician

## 2012-09-26 ENCOUNTER — Other Ambulatory Visit: Payer: Self-pay

## 2012-09-26 VITALS — Ht 65.75 in | Wt 273.5 lb

## 2012-09-26 DIAGNOSIS — E8881 Metabolic syndrome: Secondary | ICD-10-CM

## 2012-09-26 DIAGNOSIS — Z1231 Encounter for screening mammogram for malignant neoplasm of breast: Secondary | ICD-10-CM

## 2012-09-26 DIAGNOSIS — E663 Overweight: Secondary | ICD-10-CM | POA: Insufficient documentation

## 2012-09-26 DIAGNOSIS — Z713 Dietary counseling and surveillance: Secondary | ICD-10-CM | POA: Insufficient documentation

## 2012-09-26 NOTE — Progress Notes (Signed)
Medical Nutrition Therapy:  Appt start time: 1100 end time:  1200.  Assessment:  Primary concerns today: wt loss.   MEDICATIONS: see list.   DIETARY INTAKE:  Usual eating pattern includes 3 meals and 2 snacks per day.  Everyday foods include cereal, yogurt, coffee.  Avoided foods include limited red meat.    24-hr recall:  B ( AM): raisin bran or cheerios with 1% milk. Coffee with cream and equal.  Snk ( AM): apple or [place] yogurt with granola  L ( PM): salad with tomato, cucs, eggs, deli [place], light [place] or vinegrette dressing Snk ( PM): apple or banana D ( PM): baked or grilled salmon, [place] burgers, chix, greens, corn, green beans, spinach. Snk ( PM): none usually. Sometimes raw veg or peanut butter. Beverages: coffee, water. Occasional sangria  Pt reports she has made many changes in past 1.5 months, shifting away from salty, fatty foods, and cutting out almost all sweets. Some c/o lightheadedness.  Usual physical activity: now has a membership to [employer]. 30-40 min cardio, then weights for 15-30 minutes, then sauna for 10-15 minutes. Attends 3-4 days per week.   Progress Towards Goal(s):  In progress.   Nutritional Diagnosis:  -3.3 Overweight/obesity As related to past Hx of high fat, high sodium foods, sweet foods, low PA.  As evidenced by pt reports of drastic improvements to diet in past 1.5 months, shifting from fattier foods, salty foods, and sweet foods.    Intervention:  RD encouraged already established positive changes to diet and PA. RD discussed with pt more meal preparation and snacking choices as appropriate. RD advised pt to follow up with MD for possible alterations to management of HTN if recurrent lightheadedness begins. RD explained the importance of regular incorporation of high protein foods, and which meat options would be considered lean. RD recommended switch to milk from creamer in coffee, and an additional protein food with breakfast, such  as an egg.   Handouts given during visit include:  Low Sodium Flavoring Guide  Plate Method  Monitoring/Evaluation:  Dietary intake, exercise, portion control, and body weight in 2 month(s).

## 2012-10-21 ENCOUNTER — Ambulatory Visit (INDEPENDENT_AMBULATORY_CARE_PROVIDER_SITE_OTHER): Payer: Managed Care, Other (non HMO) | Admitting: Family Medicine

## 2012-10-21 ENCOUNTER — Encounter: Payer: Self-pay | Admitting: Family Medicine

## 2012-10-21 ENCOUNTER — Other Ambulatory Visit (HOSPITAL_COMMUNITY)
Admission: RE | Admit: 2012-10-21 | Discharge: 2012-10-21 | Disposition: A | Discharge status: Home / Self Care | Payer: Managed Care, Other (non HMO) | Source: Ambulatory Visit | Attending: Family Medicine | Admitting: Family Medicine

## 2012-10-21 VITALS — BP 120/70 | HR 98 | Temp 98.4°F | Ht 65.75 in | Wt 273.0 lb

## 2012-10-21 DIAGNOSIS — I10 Essential (primary) hypertension: Secondary | ICD-10-CM

## 2012-10-21 DIAGNOSIS — F17200 Nicotine dependence, unspecified, uncomplicated: Secondary | ICD-10-CM

## 2012-10-21 DIAGNOSIS — Z01419 Encounter for gynecological examination (general) (routine) without abnormal findings: Secondary | ICD-10-CM | POA: Insufficient documentation

## 2012-10-21 DIAGNOSIS — Z124 Encounter for screening for malignant neoplasm of cervix: Secondary | ICD-10-CM

## 2012-10-21 DIAGNOSIS — Z1151 Encounter for screening for human papillomavirus (HPV): Secondary | ICD-10-CM | POA: Insufficient documentation

## 2012-10-21 DIAGNOSIS — Z0000 Encounter for general adult medical examination without abnormal findings: Secondary | ICD-10-CM

## 2012-10-21 DIAGNOSIS — E785 Hyperlipidemia, unspecified: Secondary | ICD-10-CM

## 2012-10-21 LAB — BASIC METABOLIC PANEL
BUN: 11 mg/dL (ref 6–23)
CO2: 28 mEq/L (ref 19–32)
Calcium: 9.5 mg/dL (ref 8.4–10.5)
Chloride: 102 mEq/L (ref 96–112)
Creatinine, Ser: 0.7 mg/dL (ref 0.4–1.2)
GFR: 124.16 mL/min (ref >60.00)
Glucose, Bld: 96 mg/dL (ref 70–99)
Potassium: 3.6 mEq/L (ref 3.5–5.1)
Sodium: 137 mEq/L (ref 135–145)

## 2012-10-21 LAB — LIPID PANEL
Cholesterol: 167 mg/dL (ref 0–200)
HDL: 42.5 mg/dL (ref >39.00)
LDL Cholesterol: 97 mg/dL (ref 0–99)
Total CHOL/HDL Ratio: 4
Triglycerides: 140 mg/dL (ref 0.0–149.0)
VLDL: 28 mg/dL (ref 0.0–40.0)

## 2012-10-21 LAB — CBC WITH DIFFERENTIAL/PLATELET
Basophils Absolute: 0.1 10*3/uL (ref 0.0–0.1)
Basophils Relative: 0.6 % (ref 0.0–3.0)
Eosinophils Absolute: 0.2 10*3/uL (ref 0.0–0.7)
Eosinophils Relative: 2.5 % (ref 0.0–5.0)
HCT: 42 % (ref 36.0–46.0)
Hemoglobin: 14.2 g/dL (ref 12.0–15.0)
Lymphocytes Relative: 37.6 % (ref 12.0–46.0)
Lymphs Abs: 3.3 10*3/uL (ref 0.7–4.0)
MCHC: 33.9 g/dL (ref 30.0–36.0)
MCV: 96.2 fl (ref 78.0–100.0)
Monocytes Absolute: 0.6 10*3/uL (ref 0.1–1.0)
Monocytes Relative: 7.4 % (ref 3.0–12.0)
Neutro Abs: 4.5 10*3/uL (ref 1.4–7.7)
Neutrophils Relative %: 51.9 % (ref 43.0–77.0)
Platelets: 212 10*3/uL (ref 150.0–400.0)
RBC: 4.37 Mil/uL (ref 3.87–5.11)
RDW: 13.8 % (ref 11.5–14.6)
WBC: 8.7 10*3/uL (ref 4.5–10.5)

## 2012-10-21 LAB — TSH: TSH: 1.03 u[IU]/mL (ref 0.35–5.50)

## 2012-10-21 LAB — POCT URINALYSIS DIPSTICK
Bilirubin, UA: NEGATIVE
Blood, UA: NEGATIVE
Glucose, UA: NEGATIVE
Ketones, UA: NEGATIVE
Leukocytes, UA: NEGATIVE
Nitrite, UA: NEGATIVE
Protein, UA: NEGATIVE
Spec Grav, UA: 1.015
Urobilinogen, UA: 0.2
pH, UA: 6

## 2012-10-21 LAB — HEPATIC FUNCTION PANEL
ALT: 24 U/L (ref 0–35)
AST: 21 U/L (ref 0–37)
Albumin: 3.9 g/dL (ref 3.5–5.2)
Alkaline Phosphatase: 63 U/L (ref 39–117)
Bilirubin, Direct: 0 mg/dL (ref 0.0–0.3)
Total Bilirubin: 0.7 mg/dL (ref 0.3–1.2)
Total Protein: 8 g/dL (ref 6.0–8.3)

## 2012-10-21 LAB — MICROALBUMIN / CREATININE URINE RATIO
Creatinine,U: 67.3 mg/dL
Microalb Creat Ratio: 0.4 mg/g (ref 0.0–30.0)
Microalb, Ur: 0.3 mg/dL (ref 0.0–1.9)

## 2012-10-21 NOTE — Assessment & Plan Note (Signed)
Check labs 

## 2012-10-21 NOTE — Assessment & Plan Note (Signed)
Stable con't meds 

## 2012-10-21 NOTE — Addendum Note (Signed)
Addended by: Arnette Norris on: 10/21/2012 02:20 PM   Modules accepted: Orders

## 2012-10-21 NOTE — Assessment & Plan Note (Signed)
Pt is using e cig and is trying to quit

## 2012-10-21 NOTE — Progress Notes (Signed)
Subjective:     Makayla Good is a 42 y.o. female and is here for a comprehensive physical exam. The patient reports no problems.  History   Social History  . Marital Status: Single    Spouse Name: N/A    Number of Children: N/A  . Years of Education: 13   Occupational History  . release [employer]   Social History Main Topics  . Smoking status: Current Some Day Smoker -- 0.50 packs/day for 16 years    Types: Cigarettes    Start date: 03/23/1993  . Smokeless tobacco: Never Used     Comment: smokeless cigarettes  . Alcohol Use: Yes  . Drug Use: No  . Sexually Active: Yes -- Female partner(s)     Comment: been with same person over 20 years   Other Topics Concern  . Not on file   Social History Narrative   Exercise--gym, every other day--- at least 3 days a week   Health Maintenance  Topic Date Due  . Mammogram  11/11/2012  . Influenza Vaccine  12/22/2012  . Pap Smear  10/22/2015  . Tetanus/tdap  09/03/2021    The following portions of the patient's history were reviewed and updated as appropriate:  She  has a past medical history of Hyperlipidemia; Hypertension; Asthma; Polycystic ovary; Eczema; and Obesity. She  does not have any pertinent problems on file. She  has past surgical history that includes Cesarean section (02/01/2005) and Tubal ligation (02/01/2005). Her family history includes Arthritis in her sister; Coronary artery disease in her father; Dementia in her mother; Hypertension in her father, mother, and sisters; Kidney disease in an unspecified family member; Mental illness in her mother; and Schizophrenia in her mother. She  reports that she has been smoking Cigarettes.  She started smoking about 19 years ago. She has a 8 pack-year smoking history. She has never used smokeless tobacco. She reports that  drinks alcohol. She reports that she does not use illicit drugs. She has a current medication list which includes the following  prescription(s): albuterol, amlodipine, hydrochlorothiazide, meloxicam, and mometasone. Current Outpatient Prescriptions on File Prior to Visit  Medication Sig Dispense Refill  . albuterol (VENTOLIN HFA) 108 (90 BASE) MCG/ACT inhaler Inhale 2 puffs into the lungs every 6 (six) hours as needed for wheezing.  1 Inhaler  1  . amLODipine (NORVASC) 10 MG tablet take 1 tablet by mouth once daily  30 tablet  4  . hydrochlorothiazide (HYDRODIURIL) 25 MG tablet take 1 tablet by mouth once daily  90 tablet  1  . meloxicam (MOBIC) 15 MG tablet Take 1 tablet (15 mg total) by mouth daily.  30 tablet  5  . mometasone (NASONEX) 50 MCG/ACT nasal spray Place 2 sprays into the nose daily.  17 g  12   No current facility-administered medications on file prior to visit.   She has No Known Allergies..  Review of Systems Review of Systems  Constitutional: Negative for activity change, appetite change and fatigue.  HENT: Negative for hearing loss, congestion, tinnitus and ear discharge.  dentist-due Eyes: Negative for visual disturbance (see optho q1y -- vision corrected to 20/20 with glasses).  Respiratory: Negative for cough, chest tightness and shortness of breath.   Cardiovascular: Negative for chest pain, palpitations and leg swelling.  Gastrointestinal: Negative for abdominal pain, diarrhea, constipation and abdominal distention.  Genitourinary: Negative for urgency, frequency, decreased urine volume and difficulty urinating.  Musculoskeletal: Negative for back pain, arthralgias and gait problem.  Skin: Negative for color change, pallor and rash.  Neurological: Negative for dizziness, light-headedness, numbness and headaches.  Hematological: Negative for adenopathy. Does not bruise/bleed easily.  Psychiatric/Behavioral: Negative for suicidal ideas, confusion, sleep disturbance, self-injury, dysphoric mood, decreased concentration and agitation.       Objective:    BP 120/70  Pulse 98  Temp(Src)  98.4 F (36.9 C) (Oral)  Ht 5' 5.75" (1.67 m)  Wt 273 lb (123.832 kg)  BMI 44.4 kg/m2  SpO2 98%  LMP 09/30/2012 General appearance: alert, cooperative, appears stated age and no distress Head: Normocephalic, without obvious abnormality, atraumatic Eyes: conjunctivae/corneas clear. PERRL, EOM's intact. Fundi benign. Ears: normal TM's and external ear canals both ears Nose: Nares normal. Septum midline. Mucosa normal. No drainage or sinus tenderness. Throat: lips, mucosa, and tongue normal; teeth and gums normal Neck: no adenopathy, no carotid bruit, no JVD, supple, symmetrical, trachea midline and thyroid not enlarged, symmetric, no tenderness/mass/nodules Back: symmetric, no curvature. ROM normal. No CVA tenderness. Lungs: clear to auscultation bilaterally Breasts: normal appearance, no masses or tenderness Heart: regular rate and rhythm, S1, S2 normal, no murmur, click, rub or gallop Abdomen: soft, non-tender; bowel sounds normal; no masses,  no organomegaly Pelvic: cervix normal in appearance, external genitalia normal, no adnexal masses or tenderness, no cervical motion tenderness, rectovaginal septum normal, uterus normal size, shape, and consistency and vagina normal without discharge-pap done Extremities: extremities normal, atraumatic, no cyanosis or edema Pulses: 2+ and symmetric Skin: Skin color, texture, turgor normal. No rashes or lesions Lymph nodes: Cervical, supraclavicular, and axillary nodes normal. Neurologic: Alert and oriented X 3, normal strength and tone. Normal symmetric reflexes. Normal coordination and gait Psych-- no depression, no anxiety      Assessment:    Healthy female exam.      Plan:    ghm utd Check labs See After Visit Summary for Counseling Recommendations

## 2012-10-21 NOTE — Patient Instructions (Signed)
Preventive Care for Adults, Female A healthy lifestyle and preventive care can promote health and wellness. Preventive health guidelines for women include the following key practices.  A routine yearly physical is a good way to check with your caregiver about your health and preventive screening. It is a chance to share any concerns and updates on your health, and to receive a thorough exam.  Visit your dentist for a routine exam and preventive care every 6 months. Brush your teeth twice a day and floss once a day. Good oral hygiene prevents tooth decay and gum disease.  The frequency of eye exams is based on your age, health, family medical history, use of contact lenses, and other factors. Follow your caregiver's recommendations for frequency of eye exams.  Eat a healthy diet. Foods like vegetables, fruits, whole grains, low-fat dairy products, and lean protein foods contain the nutrients you need without too many calories. Decrease your intake of foods high in solid fats, added sugars, and salt. Eat the right amount of calories for you.Get information about a proper diet from your caregiver, if necessary.  Regular physical exercise is one of the most important things you can do for your health. Most adults should get at least 150 minutes of moderate-intensity exercise (any activity that increases your heart rate and causes you to sweat) each week. In addition, most adults need muscle-strengthening exercises on 2 or more days a week.  Maintain a healthy weight. The body mass index (BMI) is a screening tool to identify possible weight problems. It provides an estimate of body fat based on height and weight. Your caregiver can help determine your BMI, and can help you achieve or maintain a healthy weight.For adults 20 years and older:  A BMI below 18.5 is considered underweight.  A BMI of 18.5 to 24.9 is normal.  A BMI of 25 to 29.9 is considered overweight.  A BMI of 30 and above is  considered obese.  Maintain normal blood lipids and cholesterol levels by exercising and minimizing your intake of saturated fat. Eat a balanced diet with plenty of fruit and vegetables. Blood tests for lipids and cholesterol should begin at age 20 and be repeated every 5 years. If your lipid or cholesterol levels are high, you are over 50, or you are at high risk for heart disease, you may need your cholesterol levels checked more frequently.Ongoing high lipid and cholesterol levels should be treated with medicines if diet and exercise are not effective.  If you smoke, find out from your caregiver how to quit. If you do not use tobacco, do not start.  If you are pregnant, do not drink alcohol. If you are breastfeeding, be very cautious about drinking alcohol. If you are not pregnant and choose to drink alcohol, do not exceed 1 drink per day. One drink is considered to be 12 ounces (355 mL) of beer, 5 ounces (148 mL) of wine, or 1.5 ounces (44 mL) of liquor.  Avoid use of street drugs. Do not share needles with anyone. Ask for help if you need support or instructions about stopping the use of drugs.  High blood pressure causes heart disease and increases the risk of stroke. Your blood pressure should be checked at least every 1 to 2 years. Ongoing high blood pressure should be treated with medicines if weight loss and exercise are not effective.  If you are 55 to 42 years old, ask your caregiver if you should take aspirin to prevent strokes.  Diabetes   screening involves taking a blood sample to check your fasting blood sugar level. This should be done once every 3 years, after age 45, if you are within normal weight and without risk factors for diabetes. Testing should be considered at a younger age or be carried out more frequently if you are overweight and have at least 1 risk factor for diabetes.  Breast cancer screening is essential preventive care for women. You should practice "breast  self-awareness." This means understanding the normal appearance and feel of your breasts and may include breast self-examination. Any changes detected, no matter how small, should be reported to a caregiver. Women in their 20s and 30s should have a clinical breast exam (CBE) by a caregiver as part of a regular health exam every 1 to 3 years. After age 40, women should have a CBE every year. Starting at age 40, women should consider having a mammography (breast X-ray test) every year. Women who have a family history of breast cancer should talk to their caregiver about genetic screening. Women at a high risk of breast cancer should talk to their caregivers about having magnetic resonance imaging (MRI) and a mammography every year.  The Pap test is a screening test for cervical cancer. A Pap test can show cell changes on the cervix that might become cervical cancer if left untreated. A Pap test is a procedure in which cells are obtained and examined from the lower end of the uterus (cervix).  Women should have a Pap test starting at age 21.  Between ages 21 and 29, Pap tests should be repeated every 2 years.  Beginning at age 30, you should have a Pap test every 3 years as long as the past 3 Pap tests have been normal.  Some women have medical problems that increase the chance of getting cervical cancer. Talk to your caregiver about these problems. It is especially important to talk to your caregiver if a new problem develops soon after your last Pap test. In these cases, your caregiver may recommend more frequent screening and Pap tests.  The above recommendations are the same for women who have or have not gotten the vaccine for human papillomavirus (HPV).  If you had a hysterectomy for a problem that was not cancer or a condition that could lead to cancer, then you no longer need Pap tests. Even if you no longer need a Pap test, a regular exam is a good idea to make sure no other problems are  starting.  If you are between ages 65 and 70, and you have had normal Pap tests going back 10 years, you no longer need Pap tests. Even if you no longer need a Pap test, a regular exam is a good idea to make sure no other problems are starting.  If you have had past treatment for cervical cancer or a condition that could lead to cancer, you need Pap tests and screening for cancer for at least 20 years after your treatment.  If Pap tests have been discontinued, risk factors (such as a new sexual partner) need to be reassessed to determine if screening should be resumed.  The HPV test is an additional test that may be used for cervical cancer screening. The HPV test looks for the virus that can cause the cell changes on the cervix. The cells collected during the Pap test can be tested for HPV. The HPV test could be used to screen women aged 30 years and older, and should   be used in women of any age who have unclear Pap test results. After the age of 30, women should have HPV testing at the same frequency as a Pap test.  Colorectal cancer can be detected and often prevented. Most routine colorectal cancer screening begins at the age of 50 and continues through age 75. However, your caregiver may recommend screening at an earlier age if you have risk factors for colon cancer. On a yearly basis, your caregiver may provide home test kits to check for hidden blood in the stool. Use of a small camera at the end of a tube, to directly examine the colon (sigmoidoscopy or colonoscopy), can detect the earliest forms of colorectal cancer. Talk to your caregiver about this at age 50, when routine screening begins. Direct examination of the colon should be repeated every 5 to 10 years through age 75, unless early forms of pre-cancerous polyps or small growths are found.  Hepatitis C blood testing is recommended for all people born from 1945 through 1965 and any individual with known risks for hepatitis C.  Practice  safe sex. Use condoms and avoid high-risk sexual practices to reduce the spread of sexually transmitted infections (STIs). STIs include gonorrhea, chlamydia, syphilis, trichomonas, herpes, HPV, and human immunodeficiency virus (HIV). Herpes, HIV, and HPV are viral illnesses that have no cure. They can result in disability, cancer, and death. Sexually active women aged 25 and younger should be checked for chlamydia. Older women with new or multiple partners should also be tested for chlamydia. Testing for other STIs is recommended if you are sexually active and at increased risk.  Osteoporosis is a disease in which the bones lose minerals and strength with aging. This can result in serious bone fractures. The risk of osteoporosis can be identified using a bone density scan. Women ages 65 and over and women at risk for fractures or osteoporosis should discuss screening with their caregivers. Ask your caregiver whether you should take a calcium supplement or vitamin D to reduce the rate of osteoporosis.  Menopause can be associated with physical symptoms and risks. Hormone replacement therapy is available to decrease symptoms and risks. You should talk to your caregiver about whether hormone replacement therapy is right for you.  Use sunscreen with sun protection factor (SPF) of 30 or more. Apply sunscreen liberally and repeatedly throughout the day. You should seek shade when your shadow is shorter than you. Protect yourself by wearing long sleeves, pants, a wide-brimmed hat, and sunglasses year round, whenever you are outdoors.  Once a month, do a whole body skin exam, using a mirror to look at the skin on your back. Notify your caregiver of new moles, moles that have irregular borders, moles that are larger than a pencil eraser, or moles that have changed in shape or color.  Stay current with required immunizations.  Influenza. You need a dose every fall (or winter). The composition of the flu vaccine  changes each year, so being vaccinated once is not enough.  Pneumococcal polysaccharide. You need 1 to 2 doses if you smoke cigarettes or if you have certain chronic medical conditions. You need 1 dose at age 65 (or older) if you have never been vaccinated.  Tetanus, diphtheria, pertussis (Tdap, Td). Get 1 dose of Tdap vaccine if you are younger than age 65, are over 65 and have contact with an infant, are a healthcare worker, are pregnant, or simply want to be protected from whooping cough. After that, you need a Td   booster dose every 10 years. Consult your caregiver if you have not had at least 3 tetanus and diphtheria-containing shots sometime in your life or have a deep or dirty wound.  HPV. You need this vaccine if you are a woman age 26 or younger. The vaccine is given in 3 doses over 6 months.  Measles, mumps, rubella (MMR). You need at least 1 dose of MMR if you were born in 1957 or later. You may also need a second dose.  Meningococcal. If you are age 19 to 21 and a first-year college student living in a residence hall, or have one of several medical conditions, you need to get vaccinated against meningococcal disease. You may also need additional booster doses.  Zoster (shingles). If you are age 60 or older, you should get this vaccine.  Varicella (chickenpox). If you have never had chickenpox or you were vaccinated but received only 1 dose, talk to your caregiver to find out if you need this vaccine.  Hepatitis A. You need this vaccine if you have a specific risk factor for hepatitis A virus infection or you simply wish to be protected from this disease. The vaccine is usually given as 2 doses, 6 to 18 months apart.  Hepatitis B. You need this vaccine if you have a specific risk factor for hepatitis B virus infection or you simply wish to be protected from this disease. The vaccine is given in 3 doses, usually over 6 months. Preventive Services / Frequency Ages 19 to 39  Blood  pressure check.** / Every 1 to 2 years.  Lipid and cholesterol check.** / Every 5 years beginning at age 20.  Clinical breast exam.** / Every 3 years for women in their 20s and 30s.  Pap test.** / Every 2 years from ages 21 through 29. Every 3 years starting at age 30 through age 65 or 70 with a history of 3 consecutive normal Pap tests.  HPV screening.** / Every 3 years from ages 30 through ages 65 to 70 with a history of 3 consecutive normal Pap tests.  Hepatitis C blood test.** / For any individual with known risks for hepatitis C.  Skin self-exam. / Monthly.  Influenza immunization.** / Every year.  Pneumococcal polysaccharide immunization.** / 1 to 2 doses if you smoke cigarettes or if you have certain chronic medical conditions.  Tetanus, diphtheria, pertussis (Tdap, Td) immunization. / A one-time dose of Tdap vaccine. After that, you need a Td booster dose every 10 years.  HPV immunization. / 3 doses over 6 months, if you are 26 and younger.  Measles, mumps, rubella (MMR) immunization. / You need at least 1 dose of MMR if you were born in 1957 or later. You may also need a second dose.  Meningococcal immunization. / 1 dose if you are age 19 to 21 and a first-year college student living in a residence hall, or have one of several medical conditions, you need to get vaccinated against meningococcal disease. You may also need additional booster doses.  Varicella immunization.** / Consult your caregiver.  Hepatitis A immunization.** / Consult your caregiver. 2 doses, 6 to 18 months apart.  Hepatitis B immunization.** / Consult your caregiver. 3 doses usually over 6 months. Ages 40 to 64  Blood pressure check.** / Every 1 to 2 years.  Lipid and cholesterol check.** / Every 5 years beginning at age 20.  Clinical breast exam.** / Every year after age 40.  Mammogram.** / Every year beginning at age 40   and continuing for as long as you are in good health. Consult with your  caregiver.  Pap test.** / Every 3 years starting at age 30 through age 65 or 70 with a history of 3 consecutive normal Pap tests.  HPV screening.** / Every 3 years from ages 30 through ages 65 to 70 with a history of 3 consecutive normal Pap tests.  Fecal occult blood test (FOBT) of stool. / Every year beginning at age 50 and continuing until age 75. You may not need to do this test if you get a colonoscopy every 10 years.  Flexible sigmoidoscopy or colonoscopy.** / Every 5 years for a flexible sigmoidoscopy or every 10 years for a colonoscopy beginning at age 50 and continuing until age 75.  Hepatitis C blood test.** / For all people born from 1945 through 1965 and any individual with known risks for hepatitis C.  Skin self-exam. / Monthly.  Influenza immunization.** / Every year.  Pneumococcal polysaccharide immunization.** / 1 to 2 doses if you smoke cigarettes or if you have certain chronic medical conditions.  Tetanus, diphtheria, pertussis (Tdap, Td) immunization.** / A one-time dose of Tdap vaccine. After that, you need a Td booster dose every 10 years.  Measles, mumps, rubella (MMR) immunization. / You need at least 1 dose of MMR if you were born in 1957 or later. You may also need a second dose.  Varicella immunization.** / Consult your caregiver.  Meningococcal immunization.** / Consult your caregiver.  Hepatitis A immunization.** / Consult your caregiver. 2 doses, 6 to 18 months apart.  Hepatitis B immunization.** / Consult your caregiver. 3 doses, usually over 6 months. Ages 65 and over  Blood pressure check.** / Every 1 to 2 years.  Lipid and cholesterol check.** / Every 5 years beginning at age 20.  Clinical breast exam.** / Every year after age 40.  Mammogram.** / Every year beginning at age 40 and continuing for as long as you are in good health. Consult with your caregiver.  Pap test.** / Every 3 years starting at age 30 through age 65 or 70 with a 3  consecutive normal Pap tests. Testing can be stopped between 65 and 70 with 3 consecutive normal Pap tests and no abnormal Pap or HPV tests in the past 10 years.  HPV screening.** / Every 3 years from ages 30 through ages 65 or 70 with a history of 3 consecutive normal Pap tests. Testing can be stopped between 65 and 70 with 3 consecutive normal Pap tests and no abnormal Pap or HPV tests in the past 10 years.  Fecal occult blood test (FOBT) of stool. / Every year beginning at age 50 and continuing until age 75. You may not need to do this test if you get a colonoscopy every 10 years.  Flexible sigmoidoscopy or colonoscopy.** / Every 5 years for a flexible sigmoidoscopy or every 10 years for a colonoscopy beginning at age 50 and continuing until age 75.  Hepatitis C blood test.** / For all people born from 1945 through 1965 and any individual with known risks for hepatitis C.  Osteoporosis screening.** / A one-time screening for women ages 65 and over and women at risk for fractures or osteoporosis.  Skin self-exam. / Monthly.  Influenza immunization.** / Every year.  Pneumococcal polysaccharide immunization.** / 1 dose at age 65 (or older) if you have never been vaccinated.  Tetanus, diphtheria, pertussis (Tdap, Td) immunization. / A one-time dose of Tdap vaccine if you are over   65 and have contact with an infant, are a healthcare worker, or simply want to be protected from whooping cough. After that, you need a Td booster dose every 10 years.  Varicella immunization.** / Consult your caregiver.  Meningococcal immunization.** / Consult your caregiver.  Hepatitis A immunization.** / Consult your caregiver. 2 doses, 6 to 18 months apart.  Hepatitis B immunization.** / Check with your caregiver. 3 doses, usually over 6 months. ** Family history and personal history of risk and conditions may change your caregiver's recommendations. Document Released: 06/05/2001 Document Revised: 07/02/2011  Document Reviewed: 09/04/2010 [employer] Patient Information 2014 [employer].  

## 2012-10-21 NOTE — Assessment & Plan Note (Signed)
Discussed diet and exercise 

## 2012-11-14 ENCOUNTER — Ambulatory Visit
Admission: RE | Admit: 2012-11-14 | Discharge: 2012-11-14 | Disposition: A | Discharge status: Home / Self Care | Payer: Managed Care, Other (non HMO) | Source: Ambulatory Visit

## 2012-11-14 DIAGNOSIS — Z1231 Encounter for screening mammogram for malignant neoplasm of breast: Secondary | ICD-10-CM

## 2012-11-17 ENCOUNTER — Other Ambulatory Visit: Payer: Self-pay | Admitting: Family Medicine

## 2012-11-19 NOTE — Telephone Encounter (Signed)
Refill done per protocol.  

## 2012-11-28 ENCOUNTER — Ambulatory Visit: Payer: Managed Care, Other (non HMO) | Admitting: Dietician

## 2013-02-23 ENCOUNTER — Other Ambulatory Visit: Payer: Self-pay | Admitting: Family Medicine

## 2013-06-15 ENCOUNTER — Other Ambulatory Visit: Payer: Self-pay | Admitting: Internal Medicine

## 2013-08-04 ENCOUNTER — Ambulatory Visit (INDEPENDENT_AMBULATORY_CARE_PROVIDER_SITE_OTHER): Payer: Managed Care, Other (non HMO) | Admitting: Family Medicine

## 2013-08-04 ENCOUNTER — Encounter: Payer: Self-pay | Admitting: Family Medicine

## 2013-08-04 ENCOUNTER — Telehealth: Payer: Self-pay | Admitting: Family Medicine

## 2013-08-04 VITALS — BP 122/84 | HR 104 | Temp 98.2°F | Resp 16 | Wt 281.1 lb

## 2013-08-04 DIAGNOSIS — F17209 Nicotine dependence, unspecified, with unspecified nicotine-induced disorders: Secondary | ICD-10-CM

## 2013-08-04 DIAGNOSIS — F17200 Nicotine dependence, unspecified, uncomplicated: Secondary | ICD-10-CM

## 2013-08-04 DIAGNOSIS — I10 Essential (primary) hypertension: Secondary | ICD-10-CM

## 2013-08-04 DIAGNOSIS — R002 Palpitations: Secondary | ICD-10-CM

## 2013-08-04 MED ORDER — METOPROLOL SUCCINATE ER 25 MG PO TB24: 25.0000 mg | ORAL_TABLET | Freq: Every day | ORAL | Status: DC

## 2013-08-04 NOTE — Progress Notes (Signed)
Pre visit review using our clinic review tool, if applicable. No additional management support is needed unless otherwise documented below in the visit note. 

## 2013-08-04 NOTE — Telephone Encounter (Signed)
Relevant patient education assigned to patient using Emmi. ° °

## 2013-08-04 NOTE — Progress Notes (Signed)
  Subjective:    Patient here for follow-up of elevated blood pressure.  She is not exercising and is not adherent to a low-salt diet.  Blood pressure is well controlled at home. Cardiac symptoms: palpitations. Patient denies: chest pain, chest pressure/discomfort, claudication, dyspnea, exertional chest pressure/discomfort, fatigue, irregular heart beat, lower extremity edema, near-syncope, orthopnea, paroxysmal nocturnal dyspnea, syncope and tachypnea. Cardiovascular risk factors: hypertension, obesity (BMI >= 30 kg/m2) and sedentary lifestyle. Use of agents associated with hypertension: none. History of target organ damage: none.  The following portions of the patient's history were reviewed and updated as appropriate: allergies, current medications, past family history, past medical history, past social history, past surgical history and problem list.  Review of Systems Pertinent items are noted in HPI.     Objective:    BP 122/84  Pulse 104  Temp(Src) 98.2 F (36.8 C) (Oral)  Resp 16  Wt 281 lb 2 oz (127.517 kg)  SpO2 98% General appearance: alert, cooperative, appears stated age and no distress Throat: lips, mucosa, and tongue normal; teeth and gums normal Neck: no adenopathy, no carotid bruit, no JVD, supple, symmetrical, trachea midline and thyroid not enlarged, symmetric, no tenderness/mass/nodules Lungs: clear to auscultation bilaterally Heart: S1, S2 normal Extremities: extremities normal, atraumatic, no cyanosis or edema    Assessment:    Hypertension, normal blood pressure . Evidence of target organ damage: none.    Plan:    Medication: begin toprol per orders. Dietary sodium restriction. Regular aerobic exercise. Check blood pressures 2-3 times weekly and record. Follow up: 3 months and as needed.   1. Palpitations  - EKG 12-Lead - Microalbumin / creatinine urine ratio; Future - POCT urinalysis dipstick; Future - TSH; Future - metoprolol succinate (TOPROL-XL)  25 MG 24 hr tablet; Take 1 tablet (25 mg total) by mouth daily.  Dispense: 30 tablet; Refill: 2  2. HTN (hypertension) stable - Basic metabolic panel; Future - CBC with Differential; Future - Lipid panel; Future - Hepatic function panel; Future - Microalbumin / creatinine urine ratio; Future - POCT urinalysis dipstick; Future - metoprolol succinate (TOPROL-XL) 25 MG 24 hr tablet; Take 1 tablet (25 mg total) by mouth daily.  Dispense: 30 tablet; Refill: 2  3. Tobacco use disorder, continuous

## 2013-08-04 NOTE — Patient Instructions (Signed)
Palpitations   A palpitation is the feeling that your heartbeat is irregular or is faster than normal. It may feel like your heart is fluttering or skipping a beat. Palpitations are usually not a serious problem. However, in some cases, you may need further medical evaluation.  CAUSES   Palpitations can be caused by:   Smoking.   Caffeine or other stimulants, such as diet pills or energy drinks.   Alcohol.   Stress and anxiety.   Strenuous physical activity.   Fatigue.   Certain medicines.   Heart disease, especially if you have a history of arrhythmias. This includes atrial fibrillation, atrial flutter, or supraventricular tachycardia.   An improperly working pacemaker or defibrillator.  DIAGNOSIS   To find the cause of your palpitations, your caregiver will take your history and perform a physical exam. Tests may also be done, including:   Electrocardiography (ECG). This test records the heart's electrical activity.   Cardiac monitoring. This allows your caregiver to monitor your heart rate and rhythm in real time.   Holter monitor. This is a portable device that records your heartbeat and can help diagnose heart arrhythmias. It allows your caregiver to track your heart activity for several days, if needed.   Stress tests by exercise or by giving medicine that makes the heart beat faster.  TREATMENT   Treatment of palpitations depends on the cause of your symptoms and can vary greatly. Most cases of palpitations do not require any treatment other than time, relaxation, and monitoring your symptoms. Other causes, such as atrial fibrillation, atrial flutter, or supraventricular tachycardia, usually require further treatment.  HOME CARE INSTRUCTIONS    Avoid:   Caffeinated coffee, tea, soft drinks, diet pills, and energy drinks.   Chocolate.   Alcohol.   Stop smoking if you smoke.   Reduce your stress and anxiety. Things that can help you relax include:   A method that measures bodily functions so  you can learn to control them (biofeedback).   Yoga.   Meditation.   Physical activity such as swimming, jogging, or walking.   Get plenty of rest and sleep.  SEEK MEDICAL CARE IF:    You continue to have a fast or irregular heartbeat beyond 24 hours.   Your palpitations occur more often.  SEEK IMMEDIATE MEDICAL CARE IF:   You develop chest pain or shortness of breath.   You have a severe headache.   You feel dizzy, or you faint.  MAKE SURE YOU:   Understand these instructions.   Will watch your condition.   Will get help right away if you are not doing well or get worse.  Document Released: 04/06/2000 Document Revised: 08/04/2012 Document Reviewed: 06/08/2011  [employer] Patient Information 2014 [employer].

## 2013-08-05 ENCOUNTER — Other Ambulatory Visit (INDEPENDENT_AMBULATORY_CARE_PROVIDER_SITE_OTHER): Payer: Managed Care, Other (non HMO)

## 2013-08-05 ENCOUNTER — Telehealth: Payer: Self-pay | Admitting: Family Medicine

## 2013-08-05 DIAGNOSIS — I10 Essential (primary) hypertension: Secondary | ICD-10-CM

## 2013-08-05 DIAGNOSIS — R002 Palpitations: Secondary | ICD-10-CM

## 2013-08-05 LAB — HEPATIC FUNCTION PANEL
ALT: 19 U/L (ref 0–35)
AST: 19 U/L (ref 0–37)
Albumin: 3.7 g/dL (ref 3.5–5.2)
Alkaline Phosphatase: 61 U/L (ref 39–117)
Bilirubin, Direct: 0 mg/dL (ref 0.0–0.3)
Total Bilirubin: 0.7 mg/dL (ref 0.3–1.2)
Total Protein: 7.2 g/dL (ref 6.0–8.3)

## 2013-08-05 LAB — CBC WITH DIFFERENTIAL/PLATELET
Basophils Absolute: 0.1 10*3/uL (ref 0.0–0.1)
Basophils Relative: 0.9 % (ref 0.0–3.0)
Eosinophils Absolute: 0.1 10*3/uL (ref 0.0–0.7)
Eosinophils Relative: 1.6 % (ref 0.0–5.0)
HCT: 41.4 % (ref 36.0–46.0)
Hemoglobin: 13.8 g/dL (ref 12.0–15.0)
Lymphocytes Relative: 42.5 % (ref 12.0–46.0)
Lymphs Abs: 3.3 10*3/uL (ref 0.7–4.0)
MCHC: 33.3 g/dL (ref 30.0–36.0)
MCV: 96.2 fl (ref 78.0–100.0)
Monocytes Absolute: 0.6 10*3/uL (ref 0.1–1.0)
Monocytes Relative: 7.6 % (ref 3.0–12.0)
Neutro Abs: 3.7 10*3/uL (ref 1.4–7.7)
Neutrophils Relative %: 47.4 % (ref 43.0–77.0)
Platelets: 246 10*3/uL (ref 150.0–400.0)
RBC: 4.3 Mil/uL (ref 3.87–5.11)
RDW: 14.1 % (ref 11.5–14.6)
WBC: 7.9 10*3/uL (ref 4.5–10.5)

## 2013-08-05 LAB — BASIC METABOLIC PANEL
BUN: 10 mg/dL (ref 6–23)
CO2: 30 mEq/L (ref 19–32)
Calcium: 9.3 mg/dL (ref 8.4–10.5)
Chloride: 102 mEq/L (ref 96–112)
Creatinine, Ser: 0.7 mg/dL (ref 0.4–1.2)
GFR: 123.69 mL/min (ref >60.00)
Glucose, Bld: 96 mg/dL (ref 70–99)
Potassium: 3.5 mEq/L (ref 3.5–5.1)
Sodium: 139 mEq/L (ref 135–145)

## 2013-08-05 LAB — TSH: TSH: 1.74 u[IU]/mL (ref 0.35–5.50)

## 2013-08-05 LAB — LIPID PANEL
Cholesterol: 156 mg/dL (ref 0–200)
HDL: 38.7 mg/dL — ABNORMAL LOW (ref >39.00)
LDL Cholesterol: 93 mg/dL (ref 0–99)
Total CHOL/HDL Ratio: 4
Triglycerides: 123 mg/dL (ref 0.0–149.0)
VLDL: 24.6 mg/dL (ref 0.0–40.0)

## 2013-08-05 LAB — MICROALBUMIN / CREATININE URINE RATIO
Creatinine,U: 113.8 mg/dL
Microalb Creat Ratio: 0.4 mg/g (ref 0.0–30.0)
Microalb, Ur: 0.5 mg/dL (ref 0.0–1.9)

## 2013-08-05 NOTE — Telephone Encounter (Signed)
Relevant patient education assigned to patient using Emmi. ° °

## 2013-09-03 ENCOUNTER — Other Ambulatory Visit: Payer: Self-pay

## 2013-09-03 DIAGNOSIS — Z1231 Encounter for screening mammogram for malignant neoplasm of breast: Secondary | ICD-10-CM

## 2013-09-18 ENCOUNTER — Ambulatory Visit: Payer: Managed Care, Other (non HMO) | Admitting: Family Medicine

## 2013-09-22 ENCOUNTER — Other Ambulatory Visit: Payer: Self-pay | Admitting: Family Medicine

## 2013-10-13 ENCOUNTER — Other Ambulatory Visit: Payer: Self-pay

## 2013-10-13 DIAGNOSIS — R002 Palpitations: Secondary | ICD-10-CM

## 2013-10-13 MED ORDER — METOPROLOL SUCCINATE ER 25 MG PO TB24: 25.0000 mg | ORAL_TABLET | Freq: Every day | ORAL | Status: DC

## 2013-11-04 ENCOUNTER — Encounter: Payer: Self-pay | Admitting: Family Medicine

## 2013-11-04 ENCOUNTER — Ambulatory Visit (INDEPENDENT_AMBULATORY_CARE_PROVIDER_SITE_OTHER): Payer: Managed Care, Other (non HMO) | Admitting: Family Medicine

## 2013-11-04 VITALS — BP 118/80 | HR 97 | Temp 98.5°F | Ht 65.75 in | Wt 275.0 lb

## 2013-11-04 DIAGNOSIS — Z0000 Encounter for general adult medical examination without abnormal findings: Secondary | ICD-10-CM

## 2013-11-04 DIAGNOSIS — I10 Essential (primary) hypertension: Secondary | ICD-10-CM

## 2013-11-04 DIAGNOSIS — R002 Palpitations: Secondary | ICD-10-CM

## 2013-11-04 LAB — BASIC METABOLIC PANEL
BUN: 10 mg/dL (ref 6–23)
CO2: 25 mEq/L (ref 19–32)
Calcium: 9.1 mg/dL (ref 8.4–10.5)
Chloride: 103 mEq/L (ref 96–112)
Creatinine, Ser: 0.7 mg/dL (ref 0.4–1.2)
GFR: 115.55 mL/min (ref >60.00)
Glucose, Bld: 96 mg/dL (ref 70–99)
Potassium: 3.2 mEq/L — ABNORMAL LOW (ref 3.5–5.1)
Sodium: 137 mEq/L (ref 135–145)

## 2013-11-04 LAB — LIPID PANEL
Cholesterol: 162 mg/dL (ref 0–200)
HDL: 37.6 mg/dL — ABNORMAL LOW (ref >39.00)
LDL Cholesterol: 95 mg/dL (ref 0–99)
NonHDL: 124.4
Total CHOL/HDL Ratio: 4
Triglycerides: 147 mg/dL (ref 0.0–149.0)
VLDL: 29.4 mg/dL (ref 0.0–40.0)

## 2013-11-04 LAB — HEPATIC FUNCTION PANEL
ALT: 22 U/L (ref 0–35)
AST: 20 U/L (ref 0–37)
Albumin: 3.8 g/dL (ref 3.5–5.2)
Alkaline Phosphatase: 62 U/L (ref 39–117)
Bilirubin, Direct: 0 mg/dL (ref 0.0–0.3)
Total Bilirubin: 0.7 mg/dL (ref 0.2–1.2)
Total Protein: 7.3 g/dL (ref 6.0–8.3)

## 2013-11-04 NOTE — Patient Instructions (Signed)
Preventive Care for Adults A healthy lifestyle and preventive care can promote health and wellness. Preventive health guidelines for women include the following key practices.  A routine yearly physical is a good way to check with your health care provider about your health and preventive screening. It is a chance to share any concerns and updates on your health and to receive a thorough exam.  Visit your dentist for a routine exam and preventive care every 6 months. Brush your teeth twice a day and floss once a day. Good oral hygiene prevents tooth decay and gum disease.  The frequency of eye exams is based on your age, health, family medical history, use of contact lenses, and other factors. Follow your health care provider's recommendations for frequency of eye exams.  Eat a healthy diet. Foods like vegetables, fruits, whole grains, low-fat dairy products, and lean protein foods contain the nutrients you need without too many calories. Decrease your intake of foods high in solid fats, added sugars, and salt. Eat the right amount of calories for you.Get information about a proper diet from your health care provider, if necessary.  Regular physical exercise is one of the most important things you can do for your health. Most adults should get at least 150 minutes of moderate-intensity exercise (any activity that increases your heart rate and causes you to sweat) each week. In addition, most adults need muscle-strengthening exercises on 2 or more days a week.  Maintain a healthy weight. The body mass index (BMI) is a screening tool to identify possible weight problems. It provides an estimate of body fat based on height and weight. Your health care provider can find your BMI, and can help you achieve or maintain a healthy weight.For adults 20 years and older:  A BMI below 18.5 is considered underweight.  A BMI of 18.5 to 24.9 is normal.  A BMI of 25 to 29.9 is considered overweight.  A BMI of  30 and above is considered obese.  Maintain normal blood lipids and cholesterol levels by exercising and minimizing your intake of saturated fat. Eat a balanced diet with plenty of fruit and vegetables. Blood tests for lipids and cholesterol should begin at age 52 and be repeated every 5 years. If your lipid or cholesterol levels are high, you are over 50, or you are at high risk for heart disease, you may need your cholesterol levels checked more frequently.Ongoing high lipid and cholesterol levels should be treated with medicines if diet and exercise are not working.  If you smoke, find out from your health care provider how to quit. If you do not use tobacco, do not start.  Lung cancer screening is recommended for adults aged 37-80 years who are at high risk for developing lung cancer because of a history of smoking. A yearly low-dose CT scan of the lungs is recommended for people who have at least a 30-pack-year history of smoking and are a current smoker or have quit within the past 15 years. A pack year of smoking is smoking an average of 1 pack of cigarettes a day for 1 year (for example: 1 pack a day for 30 years or 2 packs a day for 15 years). Yearly screening should continue until the smoker has stopped smoking for at least 15 years. Yearly screening should be stopped for people who develop a health problem that would prevent them from having lung cancer treatment.  If you are pregnant, do not drink alcohol. If you are breastfeeding,  be very cautious about drinking alcohol. If you are not pregnant and choose to drink alcohol, do not have more than 1 drink per day. One drink is considered to be 12 ounces (355 mL) of beer, 5 ounces (148 mL) of wine, or 1.5 ounces (44 mL) of liquor.  Avoid use of street drugs. Do not share needles with anyone. Ask for help if you need support or instructions about stopping the use of drugs.  High blood pressure causes heart disease and increases the risk of  stroke. Your blood pressure should be checked at least every 1 to 2 years. Ongoing high blood pressure should be treated with medicines if weight loss and exercise do not work.  If you are 3-86 years old, ask your health care provider if you should take aspirin to prevent strokes.  Diabetes screening involves taking a blood sample to check your fasting blood sugar level. This should be done once every 3 years, after age 67, if you are within normal weight and without risk factors for diabetes. Testing should be considered at a younger age or be carried out more frequently if you are overweight and have at least 1 risk factor for diabetes.  Breast cancer screening is essential preventive care for women. You should practice "breast self-awareness." This means understanding the normal appearance and feel of your breasts and may include breast self-examination. Any changes detected, no matter how small, should be reported to a health care provider. Women in their 8s and 30s should have a clinical breast exam (CBE) by a health care provider as part of a regular health exam every 1 to 3 years. After age 70, women should have a CBE every year. Starting at age 25, women should consider having a mammogram (breast X-ray test) every year. Women who have a family history of breast cancer should talk to their health care provider about genetic screening. Women at a high risk of breast cancer should talk to their health care providers about having an MRI and a mammogram every year.  Breast cancer gene (BRCA)-related cancer risk assessment is recommended for women who have family members with BRCA-related cancers. BRCA-related cancers include breast, ovarian, tubal, and peritoneal cancers. Having family members with these cancers may be associated with an increased risk for harmful changes (mutations) in the breast cancer genes BRCA1 and BRCA2. Results of the assessment will determine the need for genetic counseling and  BRCA1 and BRCA2 testing.  Routine pelvic exams to screen for cancer are no longer recommended for nonpregnant women who are considered low risk for cancer of the pelvic organs (ovaries, uterus, and vagina) and who do not have symptoms. Ask your health care provider if a screening pelvic exam is right for you.  If you have had past treatment for cervical cancer or a condition that could lead to cancer, you need Pap tests and screening for cancer for at least 20 years after your treatment. If Pap tests have been discontinued, your risk factors (such as having a new sexual partner) need to be reassessed to determine if screening should be resumed. Some women have medical problems that increase the chance of getting cervical cancer. In these cases, your health care provider may recommend more frequent screening and Pap tests.  The HPV test is an additional test that may be used for cervical cancer screening. The HPV test looks for the virus that can cause the cell changes on the cervix. The cells collected during the Pap test can be  tested for HPV. The HPV test could be used to screen women aged 47 years and older, and should be used in women of any age who have unclear Pap test results. After the age of 36, women should have HPV testing at the same frequency as a Pap test.  Colorectal cancer can be detected and often prevented. Most routine colorectal cancer screening begins at the age of 38 years and continues through age 58 years. However, your health care provider may recommend screening at an earlier age if you have risk factors for colon cancer. On a yearly basis, your health care provider may provide home test kits to check for hidden blood in the stool. Use of a small camera at the end of a tube, to directly examine the colon (sigmoidoscopy or colonoscopy), can detect the earliest forms of colorectal cancer. Talk to your health care provider about this at age 64, when routine screening begins. Direct  exam of the colon should be repeated every 5-10 years through age 21 years, unless early forms of pre-cancerous polyps or small growths are found.  People who are at an increased risk for hepatitis B should be screened for this virus. You are considered at high risk for hepatitis B if:  You were born in a country where hepatitis B occurs often. Talk with your health care provider about which countries are considered high risk.  Your parents were born in a high-risk country and you have not received a shot to protect against hepatitis B (hepatitis B vaccine).  You have HIV or AIDS.  You use needles to inject street drugs.  You live with, or have sex with, someone who has Hepatitis B.  You get hemodialysis treatment.  You take certain medicines for conditions like cancer, organ transplantation, and autoimmune conditions.  Hepatitis C blood testing is recommended for all people born from [birth year] through 1965 and any individual with known risks for hepatitis C.  Practice safe sex. Use condoms and avoid high-risk sexual practices to reduce the spread of sexually transmitted infections (STIs). STIs include gonorrhea, chlamydia, syphilis, trichomonas, herpes, HPV, and human immunodeficiency virus (HIV). Herpes, HIV, and HPV are viral illnesses that have no cure. They can result in disability, cancer, and death.  You should be screened for sexually transmitted illnesses (STIs) including gonorrhea and chlamydia if:  You are sexually active and are younger than 24 years.  You are older than 24 years and your health care provider tells you that you are at risk for this type of infection.  Your sexual activity has changed since you were last screened and you are at an increased risk for chlamydia or gonorrhea. Ask your health care provider if you are at risk.  If you are at risk of being infected with HIV, it is recommended that you take a prescription medicine daily to prevent HIV infection. This is  called preexposure prophylaxis (PrEP). You are considered at risk if:  You are a heterosexual woman, are sexually active, and are at increased risk for HIV infection.  You take drugs by injection.  You are sexually active with a partner who has HIV.  Talk with your health care provider about whether you are at high risk of being infected with HIV. If you choose to begin PrEP, you should first be tested for HIV. You should then be tested every 3 months for as long as you are taking PrEP.  Osteoporosis is a disease in which the bones lose minerals and strength  with aging. This can result in serious bone fractures or breaks. The risk of osteoporosis can be identified using a bone density scan. Women ages 65 years and over and women at risk for fractures or osteoporosis should discuss screening with their health care providers. Ask your health care provider whether you should take a calcium supplement or vitamin D to reduce the rate of osteoporosis.  Menopause can be associated with physical symptoms and risks. Hormone replacement therapy is available to decrease symptoms and risks. You should talk to your health care provider about whether hormone replacement therapy is right for you.  Use sunscreen. Apply sunscreen liberally and repeatedly throughout the day. You should seek shade when your shadow is shorter than you. Protect yourself by wearing long sleeves, pants, a wide-brimmed hat, and sunglasses year round, whenever you are outdoors.  Once a month, do a whole body skin exam, using a mirror to look at the skin on your back. Tell your health care provider of new moles, moles that have irregular borders, moles that are larger than a pencil eraser, or moles that have changed in shape or color.  Stay current with required vaccines (immunizations).  Influenza vaccine. All adults should be immunized every year.  Tetanus, diphtheria, and acellular pertussis (Td, Tdap) vaccine. Pregnant women should  receive 1 dose of Tdap vaccine during each pregnancy. The dose should be obtained regardless of the length of time since the last dose. Immunization is preferred during the 27th-36th week of gestation. An adult who has not previously received Tdap or who does not know her vaccine status should receive 1 dose of Tdap. This initial dose should be followed by tetanus and diphtheria toxoids (Td) booster doses every 10 years. Adults with an unknown or incomplete history of completing a 3-dose immunization series with Td-containing vaccines should begin or complete a primary immunization series including a Tdap dose. Adults should receive a Td booster every 10 years.  Varicella vaccine. An adult without evidence of immunity to varicella should receive 2 doses or a second dose if she has previously received 1 dose. Pregnant females who do not have evidence of immunity should receive the first dose after pregnancy. This first dose should be obtained before leaving the health care facility. The second dose should be obtained 4-8 weeks after the first dose.  Human papillomavirus (HPV) vaccine. Females aged 13-26 years who have not received the vaccine previously should obtain the 3-dose series. The vaccine is not recommended for use in pregnant females. However, pregnancy testing is not needed before receiving a dose. If a female is found to be pregnant after receiving a dose, no treatment is needed. In that case, the remaining doses should be delayed until after the pregnancy. Immunization is recommended for any person with an immunocompromised condition through the age of 26 years if she did not get any or all doses earlier. During the 3-dose series, the second dose should be obtained 4-8 weeks after the first dose. The third dose should be obtained 24 weeks after the first dose and 16 weeks after the second dose.  Zoster vaccine. One dose is recommended for adults aged 60 years or older unless certain conditions are  present.  Measles, mumps, and rubella (MMR) vaccine. Adults born before 1957 generally are considered immune to measles and mumps. Adults born in 1957 or later should have 1 or more doses of MMR vaccine unless there is a contraindication to the vaccine or there is laboratory evidence of immunity to   each of the three diseases. A routine second dose of MMR vaccine should be obtained at least 28 days after the first dose for students attending postsecondary schools, health care workers, or international travelers. People who received inactivated measles vaccine or an unknown type of measles vaccine during 1963-1967 should receive 2 doses of MMR vaccine. People who received inactivated mumps vaccine or an unknown type of mumps vaccine before 1979 and are at high risk for mumps infection should consider immunization with 2 doses of MMR vaccine. For females of childbearing age, rubella immunity should be determined. If there is no evidence of immunity, females who are not pregnant should be vaccinated. If there is no evidence of immunity, females who are pregnant should delay immunization until after pregnancy. Unvaccinated health care workers born before 1957 who lack laboratory evidence of measles, mumps, or rubella immunity or laboratory confirmation of disease should consider measles and mumps immunization with 2 doses of MMR vaccine or rubella immunization with 1 dose of MMR vaccine.  Pneumococcal 13-valent conjugate (PCV13) vaccine. When indicated, a person who is uncertain of her immunization history and has no record of immunization should receive the PCV13 vaccine. An adult aged 19 years or older who has certain medical conditions and has not been previously immunized should receive 1 dose of PCV13 vaccine. This PCV13 should be followed with a dose of pneumococcal polysaccharide (PPSV23) vaccine. The PPSV23 vaccine dose should be obtained at least 8 weeks after the dose of PCV13 vaccine. An adult aged 19  years or older who has certain medical conditions and previously received 1 or more doses of PPSV23 vaccine should receive 1 dose of PCV13. The PCV13 vaccine dose should be obtained 1 or more years after the last PPSV23 vaccine dose.  Pneumococcal polysaccharide (PPSV23) vaccine. When PCV13 is also indicated, PCV13 should be obtained first. All adults aged 65 years and older should be immunized. An adult younger than age 65 years who has certain medical conditions should be immunized. Any person who resides in a nursing home or long-term care facility should be immunized. An adult smoker should be immunized. People with an immunocompromised condition and certain other conditions should receive both PCV13 and PPSV23 vaccines. People with human immunodeficiency virus (HIV) infection should be immunized as soon as possible after diagnosis. Immunization during chemotherapy or radiation therapy should be avoided. Routine use of PPSV23 vaccine is not recommended for American Indians, [place] Natives, or people younger than 65 years unless there are medical conditions that require PPSV23 vaccine. When indicated, people who have unknown immunization and have no record of immunization should receive PPSV23 vaccine. One-time revaccination 5 years after the first dose of PPSV23 is recommended for people aged 19-64 years who have chronic kidney failure, nephrotic syndrome, asplenia, or immunocompromised conditions. People who received 1-2 doses of PPSV23 before age 65 years should receive another dose of PPSV23 vaccine at age 65 years or later if at least 5 years have passed since the previous dose. Doses of PPSV23 are not needed for people immunized with PPSV23 at or after age 65 years.  Meningococcal vaccine. Adults with asplenia or persistent complement component deficiencies should receive 2 doses of quadrivalent meningococcal conjugate (MenACWY-D) vaccine. The doses should be obtained at least 2 months apart.  Microbiologists working with certain meningococcal bacteria, military recruits, people at risk during an outbreak, and people who travel to or live in countries with a high rate of meningitis should be immunized. A first-year college student up through age   21 years who is living in a residence hall should receive a dose if she did not receive a dose on or after her 16th birthday. Adults who have certain high-risk conditions should receive one or more doses of vaccine.  Hepatitis A vaccine. Adults who wish to be protected from this disease, have certain high-risk conditions, work with hepatitis A-infected animals, work in hepatitis A research labs, or travel to or work in countries with a high rate of hepatitis A should be immunized. Adults who were previously unvaccinated and who anticipate close contact with an international adoptee during the first 60 days after arrival in the [place] from a country with a high rate of hepatitis A should be immunized.  Hepatitis B vaccine. Adults who wish to be protected from this disease, have certain high-risk conditions, may be exposed to blood or other infectious body fluids, are household contacts or sex partners of hepatitis B positive people, are clients or workers in certain care facilities, or travel to or work in countries with a high rate of hepatitis B should be immunized.  Haemophilus influenzae type b (Hib) vaccine. A previously unvaccinated person with asplenia or sickle cell disease or having a scheduled splenectomy should receive 1 dose of Hib vaccine. Regardless of previous immunization, a recipient of a hematopoietic stem cell transplant should receive a 3-dose series 6-12 months after her successful transplant. Hib vaccine is not recommended for adults with HIV infection. Preventive Services / Frequency Ages 43 to 39years  Blood pressure check.** / Every 1 to 2 years.  Lipid and cholesterol check.** / Every 5 years beginning at age  75.  Clinical breast exam.** / Every 3 years for women in their 32s and 74s.  BRCA-related cancer risk assessment.** / For women who have family members with a BRCA-related cancer (breast, ovarian, tubal, or peritoneal cancers).  Pap test.** / Every 2 years from ages 65 through 91. Every 3 years starting at age 34 through age 93 or 72 with a history of 3 consecutive normal Pap tests.  HPV screening.** / Every 3 years from ages 46 through ages 53 to 26 with a history of 3 consecutive normal Pap tests.  Hepatitis C blood test.** / For any individual with known risks for hepatitis C.  Skin self-exam. / Monthly.  Influenza vaccine. / Every year.  Tetanus, diphtheria, and acellular pertussis (Tdap, Td) vaccine.** / Consult your health care provider. Pregnant women should receive 1 dose of Tdap vaccine during each pregnancy. 1 dose of Td every 10 years.  Varicella vaccine.** / Consult your health care provider. Pregnant females who do not have evidence of immunity should receive the first dose after pregnancy.  HPV vaccine. / 3 doses over 6 months, if 70 and younger. The vaccine is not recommended for use in pregnant females. However, pregnancy testing is not needed before receiving a dose.  Measles, mumps, rubella (MMR) vaccine.** / You need at least 1 dose of MMR if you were born in 1957 or later. You may also need a 2nd dose. For females of childbearing age, rubella immunity should be determined. If there is no evidence of immunity, females who are not pregnant should be vaccinated. If there is no evidence of immunity, females who are pregnant should delay immunization until after pregnancy.  Pneumococcal 13-valent conjugate (PCV13) vaccine.** / Consult your health care provider.  Pneumococcal polysaccharide (PPSV23) vaccine.** / 1 to 2 doses if you smoke cigarettes or if you have certain conditions.  Meningococcal vaccine.** /  1 dose if you are age 70 to 51 years and a [place] living in a residence hall, or have one of several medical conditions, you need to get vaccinated against meningococcal disease. You may also need additional booster doses.  Hepatitis A vaccine.** / Consult your health care provider.  Hepatitis B vaccine.** / Consult your health care provider.  Haemophilus influenzae type b (Hib) vaccine.** / Consult your health care provider. Ages 40 to 64years  Blood pressure check.** / Every 1 to 2 years.  Lipid and cholesterol check.** / Every 5 years beginning at age 58 years.  Lung cancer screening. / Every year if you are aged 56-80 years and have a 30-pack-year history of smoking and currently smoke or have quit within the past 15 years. Yearly screening is stopped once you have quit smoking for at least 15 years or develop a health problem that would prevent you from having lung cancer treatment.  Clinical breast exam.** / Every year after age 35 years.  BRCA-related cancer risk assessment.** / For women who have family members with a BRCA-related cancer (breast, ovarian, tubal, or peritoneal cancers).  Mammogram.** / Every year beginning at age 109 years and continuing for as long as you are in good health. Consult with your health care provider.  Pap test.** / Every 3 years starting at age 44 years through age 94 or 70 years with a history of 3 consecutive normal Pap tests.  HPV screening.** / Every 3 years from ages 109 years through ages 50 to 30 years with a history of 3 consecutive normal Pap tests.  Fecal occult blood test (FOBT) of stool. / Every year beginning at age 73 years and continuing until age 59 years. You may not need to do this test if you get a colonoscopy every 10 years.  Flexible sigmoidoscopy or colonoscopy.** / Every 5 years for a flexible sigmoidoscopy or every 10 years for a colonoscopy beginning at age 68 years and continuing until age 12 years.  Hepatitis C blood test.** / For all people born from [birth year] through  1965 and any individual with known risks for hepatitis C.  Skin self-exam. / Monthly.  Influenza vaccine. / Every year.  Tetanus, diphtheria, and acellular pertussis (Tdap/Td) vaccine.** / Consult your health care provider. Pregnant women should receive 1 dose of Tdap vaccine during each pregnancy. 1 dose of Td every 10 years.  Varicella vaccine.** / Consult your health care provider. Pregnant females who do not have evidence of immunity should receive the first dose after pregnancy.  Zoster vaccine.** / 1 dose for adults aged 2 years or older.  Measles, mumps, rubella (MMR) vaccine.** / You need at least 1 dose of MMR if you were born in 1957 or later. You may also need a 2nd dose. For females of childbearing age, rubella immunity should be determined. If there is no evidence of immunity, females who are not pregnant should be vaccinated. If there is no evidence of immunity, females who are pregnant should delay immunization until after pregnancy.  Pneumococcal 13-valent conjugate (PCV13) vaccine.** / Consult your health care provider.  Pneumococcal polysaccharide (PPSV23) vaccine.** / 1 to 2 doses if you smoke cigarettes or if you have certain conditions.  Meningococcal vaccine.** / Consult your health care provider.  Hepatitis A vaccine.** / Consult your health care provider.  Hepatitis B vaccine.** / Consult your health care provider.  Haemophilus influenzae type b (Hib) vaccine.** / Consult your health care provider. Ages 48 years  and over  Blood pressure check.** / Every 1 to 2 years.  Lipid and cholesterol check.** / Every 5 years beginning at age 84 years.  Lung cancer screening. / Every year if you are aged 50-80 years and have a 30-pack-year history of smoking and currently smoke or have quit within the past 15 years. Yearly screening is stopped once you have quit smoking for at least 15 years or develop a health problem that would prevent you from having lung cancer  treatment.  Clinical breast exam.** / Every year after age 24 years.  BRCA-related cancer risk assessment.** / For women who have family members with a BRCA-related cancer (breast, ovarian, tubal, or peritoneal cancers).  Mammogram.** / Every year beginning at age 14 years and continuing for as long as you are in good health. Consult with your health care provider.  Pap test.** / Every 3 years starting at age 17 years through age 31 or 74 years with 3 consecutive normal Pap tests. Testing can be stopped between 65 and 70 years with 3 consecutive normal Pap tests and no abnormal Pap or HPV tests in the past 10 years.  HPV screening.** / Every 3 years from ages 30 years through ages 70 or 28 years with a history of 3 consecutive normal Pap tests. Testing can be stopped between 65 and 70 years with 3 consecutive normal Pap tests and no abnormal Pap or HPV tests in the past 10 years.  Fecal occult blood test (FOBT) of stool. / Every year beginning at age 64 years and continuing until age 92 years. You may not need to do this test if you get a colonoscopy every 10 years.  Flexible sigmoidoscopy or colonoscopy.** / Every 5 years for a flexible sigmoidoscopy or every 10 years for a colonoscopy beginning at age 73 years and continuing until age 39 years.  Hepatitis C blood test.** / For all people born from [birth year] through 1965 and any individual with known risks for hepatitis C.  Osteoporosis screening.** / A one-time screening for women ages 35 years and over and women at risk for fractures or osteoporosis.  Skin self-exam. / Monthly.  Influenza vaccine. / Every year.  Tetanus, diphtheria, and acellular pertussis (Tdap/Td) vaccine.** / 1 dose of Td every 10 years.  Varicella vaccine.** / Consult your health care provider.  Zoster vaccine.** / 1 dose for adults aged 59 years or older.  Pneumococcal 13-valent conjugate (PCV13) vaccine.** / Consult your health care provider.  Pneumococcal  polysaccharide (PPSV23) vaccine.** / 1 dose for all adults aged 8 years and older.  Meningococcal vaccine.** / Consult your health care provider.  Hepatitis A vaccine.** / Consult your health care provider.  Hepatitis B vaccine.** / Consult your health care provider.  Haemophilus influenzae type b (Hib) vaccine.** / Consult your health care provider. ** Family history and personal history of risk and conditions may change your health care provider's recommendations. Document Released: 06/05/2001 Document Revised: 04/14/2013 Document Reviewed: 09/04/2010 [facility] Patient Information 2015 [place], [place]. This information is not intended to replace advice given to you by your health care provider. Make sure you discuss any questions you have with your health care provider.

## 2013-11-04 NOTE — Progress Notes (Signed)
Pre visit review using our clinic review tool, if applicable. No additional management support is needed unless otherwise documented below in the visit note. 

## 2013-11-04 NOTE — Progress Notes (Signed)
Subjective:     Makayla Good is a 43 y.o. female and is here for a comprehensive physical exam. The patient reports no problems.  History   Social History  . Marital Status: Single    Spouse Name: N/A    Number of Children: N/A  . Years of Education: 13   Occupational History  . 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 Smoking status: Current Some Day Smoker -- 0.50 packs/day for 16 years    Types: Cigarettes    Start date: 03/23/1993  . Smokeless tobacco: Never Used     Comment: smokeless cigarettes  . Alcohol Use: Yes  . Drug Use: No  . Sexual Activity: Yes    Partners: Male     Comment: been with same person over 20 years   Other Topics Concern  . Not on file   Social History Narrative   Exercise--gym, every other day--- at least 3 days a week   Health Maintenance  Topic Date Due  . Mammogram  11/14/2013  . Influenza Vaccine  11/21/2013  . Pap Smear  10/22/2015  . Tetanus/tdap  09/03/2021    The following portions of the patient's history were reviewed and updated as appropriate:  She  has a past medical history of Hyperlipidemia; Hypertension; Asthma; Polycystic ovary; Eczema; and Obesity. She  does not have any pertinent problems on file. She  has past surgical history that includes Cesarean section (02/01/2005) and Tubal ligation (02/01/2005). Her family history includes Arthritis in her sister; Coronary artery disease in her father; Dementia in her mother; Hypertension in her father, mother, sister, and sister; Kidney disease in an other family member; Mental illness in her mother; Schizophrenia in her mother. She  reports that she has been smoking Cigarettes.  She started smoking about 20 years ago. She has a 8 pack-year smoking history. She has never used smokeless tobacco. She reports that she drinks alcohol. She reports that she does not use illicit drugs. She has a current medication list which includes the following  prescription(s): albuterol, amlodipine, hydrochlorothiazide, metoprolol succinate, and mometasone. Current Outpatient Prescriptions on File Prior to Visit  Medication Sig Dispense Refill  . albuterol (VENTOLIN HFA) 108 (90 BASE) MCG/ACT inhaler Inhale 2 puffs into the lungs every 6 (six) hours as needed for wheezing.  1 Inhaler  1  . amLODipine (NORVASC) 10 MG tablet take 1 tablet by mouth once daily  90 tablet  1  . hydrochlorothiazide (HYDRODIURIL) 25 MG tablet take 1 tablet by mouth once daily  90 tablet  1  . metoprolol succinate (TOPROL-XL) 25 MG 24 hr tablet Take 1 tablet (25 mg total) by mouth daily.  90 tablet  0  . mometasone (NASONEX) 50 MCG/ACT nasal spray Place 2 sprays into the nose daily.  17 g  12   No current facility-administered medications on file prior to visit.   She has No Known Allergies..  Review of Systems Review of Systems  Constitutional: Negative for activity change, appetite change and fatigue.  HENT: Negative for hearing loss, congestion, tinnitus and ear discharge.  dentist q68m Eyes: Negative for visual disturbance (see optho q1y -- vision corrected to 20/20 with glasses).  Respiratory: Negative for cough, chest tightness and shortness of breath.   Cardiovascular: Negative for chest pain, palpitations and leg swelling.  Gastrointestinal: Negative for abdominal pain, diarrhea, constipation and abdominal distention.  Genitourinary: Negative for urgency, frequency, decreased urine volume and difficulty urinating.  Musculoskeletal: Negative  for back pain, arthralgias and gait problem.  Skin: Negative for color change, pallor and rash.  Neurological: Negative for dizziness, light-headedness, numbness and headaches.  Hematological: Negative for adenopathy. Does not bruise/bleed easily.  Psychiatric/Behavioral: Negative for suicidal ideas, confusion, sleep disturbance, self-injury, dysphoric mood, decreased concentration and agitation.       Objective:    BP  118/80  Pulse 97  Temp(Src) 98.5 F (36.9 C) (Oral)  Ht 5' 5.75" (1.67 m)  Wt 275 lb (124.739 kg)  BMI 44.73 kg/m2  SpO2 96%  LMP 09/29/2013 General appearance: alert, cooperative, appears stated age and no distress Head: Normocephalic, without obvious abnormality, atraumatic Eyes: conjunctivae/corneas clear. PERRL, EOM's intact. Fundi benign. Ears: normal TM's and external ear canals both ears Nose: Nares normal. Septum midline. Mucosa normal. No drainage or sinus tenderness. Throat: lips, mucosa, and tongue normal; teeth and gums normal Neck: no adenopathy, no carotid bruit, no JVD, supple, symmetrical, trachea midline and thyroid not enlarged, symmetric, no tenderness/mass/nodules Back: symmetric, no curvature. ROM normal. No CVA tenderness. Lungs: clear to auscultation bilaterally Breasts: normal appearance, no masses or tenderness Heart: regular rate and rhythm, S1, S2 normal, no murmur, click, rub or gallop Abdomen: soft, non-tender; bowel sounds normal; no masses,  no organomegaly Pelvic: deferred Extremities: extremities normal, atraumatic, no cyanosis or edema Pulses: 2+ and symmetric Skin: Skin color, texture, turgor normal. No rashes or lesions Lymph nodes: Cervical, supraclavicular, and axillary nodes normal. Neurologic: Alert and oriented X 3, normal strength and tone. Normal symmetric reflexes. Normal coordination and gait Psych-- no depression, no anxiety      Assessment:    Healthy female exam.      Plan:    ghm utd  Check labs See After Visit Summary for Counseling Recommendations    1. Palpitations Stable, con't meds  2. Essential hypertension Stable, con't meds - Basic metabolic panel - Hepatic function panel - Lipid panel  3. Preventative health care

## 2013-11-05 ENCOUNTER — Telehealth: Payer: Self-pay | Admitting: Family Medicine

## 2013-11-05 NOTE — Telephone Encounter (Signed)
Relevant patient education assigned to patient using Emmi. ° °

## 2013-11-09 ENCOUNTER — Other Ambulatory Visit: Payer: Self-pay | Admitting: General Practice

## 2013-11-09 MED ORDER — POTASSIUM CHLORIDE CRYS ER 20 MEQ PO TBCR: 20.0000 meq | EXTENDED_RELEASE_TABLET | Freq: Every day | ORAL | Status: DC

## 2013-11-16 ENCOUNTER — Ambulatory Visit
Admission: RE | Admit: 2013-11-16 | Discharge: 2013-11-16 | Disposition: A | Discharge status: Home / Self Care | Payer: Managed Care, Other (non HMO) | Source: Ambulatory Visit

## 2013-11-16 DIAGNOSIS — Z1231 Encounter for screening mammogram for malignant neoplasm of breast: Secondary | ICD-10-CM

## 2013-12-07 ENCOUNTER — Ambulatory Visit: Payer: Managed Care, Other (non HMO) | Admitting: Medical

## 2014-01-02 ENCOUNTER — Other Ambulatory Visit: Payer: Self-pay | Admitting: Family Medicine

## 2014-01-11 ENCOUNTER — Other Ambulatory Visit: Payer: Self-pay | Admitting: Family Medicine

## 2014-02-04 ENCOUNTER — Ambulatory Visit (INDEPENDENT_AMBULATORY_CARE_PROVIDER_SITE_OTHER): Payer: Managed Care, Other (non HMO) | Admitting: Family Medicine

## 2014-02-04 ENCOUNTER — Encounter: Payer: Self-pay | Admitting: Family Medicine

## 2014-02-04 VITALS — BP 124/80 | HR 95 | Temp 98.7°F | Resp 16 | Wt 275.0 lb

## 2014-02-04 DIAGNOSIS — R300 Dysuria: Secondary | ICD-10-CM | POA: Insufficient documentation

## 2014-02-04 DIAGNOSIS — N390 Urinary tract infection, site not specified: Secondary | ICD-10-CM

## 2014-02-04 DIAGNOSIS — R82998 Other abnormal findings in urine: Secondary | ICD-10-CM

## 2014-02-04 DIAGNOSIS — R1084 Generalized abdominal pain: Secondary | ICD-10-CM

## 2014-02-04 LAB — POCT URINALYSIS DIPSTICK
Bilirubin, UA: NEGATIVE
Blood, UA: NEGATIVE
Glucose, UA: NEGATIVE
Ketones, UA: NEGATIVE
Nitrite, UA: NEGATIVE
Protein, UA: NEGATIVE
Spec Grav, UA: 1.02
Urobilinogen, UA: 0.2
pH, UA: 6.5

## 2014-02-04 MED ORDER — CEPHALEXIN 500 MG PO CAPS: 500.0000 mg | ORAL_CAPSULE | Freq: Two times a day (BID) | ORAL | Status: AC

## 2014-02-04 NOTE — Progress Notes (Signed)
Pre visit review using our clinic review tool, if applicable. No additional management support is needed unless otherwise documented below in the visit note. 

## 2014-02-04 NOTE — Assessment & Plan Note (Signed)
New.  Pt's previous sxs and UA suspicious for infxn.  Start abx and await urine cx.  Reviewed supportive care and red flags that should prompt return.  Pt expressed understanding and is in agreement w/ plan.

## 2014-02-04 NOTE — Progress Notes (Signed)
   Subjective:    Patient ID: Makayla Good, female    DOB: August 08, 1970, 43 y.o.   MRN: [id number]  HPI L lower quadrant pain- pain started 'a few days ago'.  Feeling better today.  Pain w/ urination.  Hx of ovarian cysts.  + hx of constipation.  Not currently on anything for bowels.  Denies increased frequency- 'i take a fluid'.  + hesitancy and feeling of incomplete emptying.  No fevers.  No blood in urine.  No suprapubic pain.  Mild nausea, no vomiting.   Review of Systems For ROS see HPI     Objective:   Physical Exam  Vitals reviewed. Constitutional: She is oriented to person, place, and time. She appears well-developed and well-nourished. No distress.  HENT:  Head: Normocephalic and atraumatic.  Abdominal: Soft. Bowel sounds are normal. She exhibits no distension. There is no tenderness (no suprapubic or CVA tenderness). There is no rebound and no guarding.  Neurological: She is alert and oriented to person, place, and time.  Skin: Skin is warm and dry.  Psychiatric: She has a normal mood and affect. Her behavior is normal. Thought content normal.          Assessment & Plan:

## 2014-02-04 NOTE — Patient Instructions (Signed)
Follow up as needed Start the keflex twice daily for presumed urinary tract infection We'll notify you of your culture results and make any changes if needed Drink plenty of fluids Call with any questions or concerns Happy Fall!

## 2014-02-05 ENCOUNTER — Other Ambulatory Visit: Payer: Self-pay

## 2014-02-05 LAB — URINE CULTURE
Colony Count: NO GROWTH
Organism ID, Bacteria: NO GROWTH

## 2014-02-15 ENCOUNTER — Ambulatory Visit (HOSPITAL_BASED_OUTPATIENT_CLINIC_OR_DEPARTMENT_OTHER)
Admission: RE | Admit: 2014-02-15 | Discharge: 2014-02-15 | Disposition: A | Discharge status: Home / Self Care | Payer: Managed Care, Other (non HMO) | Source: Ambulatory Visit | Attending: Family Medicine | Admitting: Family Medicine

## 2014-02-15 ENCOUNTER — Encounter: Payer: Self-pay | Admitting: Family Medicine

## 2014-02-15 ENCOUNTER — Ambulatory Visit (INDEPENDENT_AMBULATORY_CARE_PROVIDER_SITE_OTHER): Payer: Managed Care, Other (non HMO) | Admitting: Family Medicine

## 2014-02-15 VITALS — BP 132/82 | HR 100 | Temp 99.1°F | Wt 278.2 lb

## 2014-02-15 DIAGNOSIS — R05 Cough: Secondary | ICD-10-CM

## 2014-02-15 DIAGNOSIS — J208 Acute bronchitis due to other specified organisms: Secondary | ICD-10-CM

## 2014-02-15 DIAGNOSIS — R0989 Other specified symptoms and signs involving the circulatory and respiratory systems: Secondary | ICD-10-CM | POA: Diagnosis not present

## 2014-02-15 DIAGNOSIS — R059 Cough, unspecified: Secondary | ICD-10-CM

## 2014-02-15 MED ORDER — HYDROCODONE-HOMATROPINE 5-1.5 MG/5ML PO SYRP: 5.0000 mL | ORAL_SOLUTION | Freq: Three times a day (TID) | ORAL | Status: DC | PRN

## 2014-02-15 MED ORDER — AZITHROMYCIN 250 MG PO TABS
ORAL_TABLET | ORAL | Status: DC
Start: 2014-02-15 — End: 2014-03-17

## 2014-02-15 MED ORDER — ALBUTEROL SULFATE HFA 108 (90 BASE) MCG/ACT IN AERS: 2.0000 | INHALATION_SPRAY | Freq: Four times a day (QID) | RESPIRATORY_TRACT | Status: DC | PRN

## 2014-02-15 MED ORDER — PREDNISONE 10 MG PO TABS: ORAL_TABLET | ORAL | Status: DC

## 2014-02-15 NOTE — Progress Notes (Signed)
Pre visit review using our clinic review tool, if applicable. No additional management support is needed unless otherwise documented below in the visit note. 

## 2014-02-15 NOTE — Progress Notes (Signed)
Subjective:     Makayla Good is a 43 y.o. female here for evaluation of a cough. Onset of symptoms was 2 weeks ago. Symptoms have been gradually worsening since that time. The cough is productive and is aggravated by exercise, infection and reclining position. Associated symptoms include: shortness of breath, sputum production and wheezing. Patient does not have a history of asthma. Patient does have a history of environmental allergens. Patient has not traveled recently. Patient does have a history of smoking. Patient has not had a previous chest x-ray. Patient has not had a PPD done.  The following portions of the patient's history were reviewed and updated as appropriate:  She  has a past medical history of Hyperlipidemia; Hypertension; Asthma; Polycystic ovary; Eczema; and Obesity. She  does not have any pertinent problems on file. She  has past surgical history that includes Cesarean section (02/01/2005) and Tubal ligation (02/01/2005). Her family history includes Arthritis in her sister; Coronary artery disease in her father; Dementia in her mother; Hypertension in her father, mother, sister, and sister; Kidney disease in an other family member; Mental illness in her mother; Schizophrenia in her mother. She  reports that she has been smoking Cigarettes.  She started smoking about 20 years ago. She has a 8 pack-year smoking history. She has never used smokeless tobacco. She reports that she drinks alcohol. She reports that she does not use illicit drugs. She has a current medication list which includes the following prescription(s): albuterol, amlodipine, hydrochlorothiazide, metoprolol succinate, mometasone, potassium chloride sa, albuterol, azithromycin, hydrocodone-homatropine, and prednisone. Current Outpatient Prescriptions on File Prior to Visit  Medication Sig Dispense Refill  . albuterol (VENTOLIN HFA) 108 (90 BASE) MCG/ACT inhaler Inhale 2 puffs into the lungs every 6 (six) hours as  needed for wheezing.  1 Inhaler  1  . amLODipine (NORVASC) 10 MG tablet take 1 tablet by mouth once daily  90 tablet  1  . hydrochlorothiazide (HYDRODIURIL) 25 MG tablet take 1 tablet by mouth once daily  90 tablet  1  . metoprolol succinate (TOPROL-XL) 25 MG 24 hr tablet take 1 tablet by mouth once daily  90 tablet  1  . mometasone (NASONEX) 50 MCG/ACT nasal spray Place 2 sprays into the nose daily.  17 g  12  . potassium chloride SA (K-DUR,KLOR-CON) 20 MEQ tablet Take 1 tablet (20 mEq total) by mouth daily.  30 tablet  3   No current facility-administered medications on file prior to visit.   She has No Known Allergies..  Review of Systems Pertinent items are noted in HPI.    Objective:    Oxygen saturation 97% on room air BP 132/82  Pulse 100  Temp(Src) 99.1 F (37.3 C) (Oral)  Wt 278 lb 3.2 oz (126.191 kg)  SpO2 97% General appearance: alert, cooperative, appears stated age and no distress Ears: normal TM's and external ear canals both ears Nose: Nares normal. Septum midline. Mucosa normal. No drainage or sinus tenderness. Throat: lips, mucosa, and tongue normal; teeth and gums normal Neck: no adenopathy, supple, symmetrical, trachea midline and thyroid not enlarged, symmetric, no tenderness/mass/nodules Lungs: wheezes bilaterally Heart: S1, S2 normal Extremities: extremities normal, atraumatic, no cyanosis or edema    Assessment:    Acute Bronchitis    Plan:    Antibiotics per medication orders. Antitussives per medication orders. Avoid exposure to tobacco smoke and fumes. B-agonist inhaler. Call if shortness of breath worsens, blood in sputum, change in character of cough, development of fever or chills,  inability to maintain nutrition and hydration. Avoid exposure to tobacco smoke and fumes. Chest x-ray.   1. Cough  - DG Chest 2 View; Future  2. Acute bronchitis due to other specified organisms  - azithromycin (ZITHROMAX Z-PAK) 250 MG tablet; As directed   Dispense: 6 each; Refill: 0 - predniSONE (DELTASONE) 10 MG tablet; 3 po qd for 3 days then 2 po qd for 3 days the 1 po qd for 3 days  Dispense: 18 tablet; Refill: 0 - albuterol (PROVENTIL HFA;VENTOLIN HFA) 108 (90 BASE) MCG/ACT inhaler; Inhale 2 puffs into the lungs every 6 (six) hours as needed for wheezing or shortness of breath.  Dispense: 1 Inhaler; Refill: 2 - HYDROcodone-homatropine (HYCODAN) 5-1.5 MG/5ML syrup; Take 5 mLs by mouth every 8 (eight) hours as needed for cough.  Dispense: 120 mL; Refill: 0  3.  HTN-- stable     Recheck 6 months

## 2014-02-15 NOTE — Patient Instructions (Signed)

## 2014-02-15 NOTE — Assessment & Plan Note (Signed)
Stable con't meds 

## 2014-03-17 ENCOUNTER — Encounter: Payer: Self-pay | Admitting: Medical

## 2014-03-17 ENCOUNTER — Ambulatory Visit (INDEPENDENT_AMBULATORY_CARE_PROVIDER_SITE_OTHER): Payer: Managed Care, Other (non HMO) | Admitting: Medical

## 2014-03-17 VITALS — BP 123/84 | HR 105 | Temp 99.3°F | Ht 65.2 in | Wt 275.6 lb

## 2014-03-17 DIAGNOSIS — J069 Acute upper respiratory infection, unspecified: Secondary | ICD-10-CM

## 2014-03-17 DIAGNOSIS — J302 Other seasonal allergic rhinitis: Secondary | ICD-10-CM

## 2014-03-17 MED ORDER — MOMETASONE FUROATE 50 MCG/ACT NA SUSP: 2.0000 | Freq: Every day | NASAL | Status: DC

## 2014-03-17 MED ORDER — BENZONATATE 100 MG PO CAPS: 100.0000 mg | ORAL_CAPSULE | Freq: Three times a day (TID) | ORAL | Status: DC | PRN

## 2014-03-17 MED ORDER — AMOXICILLIN-POT CLAVULANATE 875-125 MG PO TABS: 1.0000 | ORAL_TABLET | Freq: Two times a day (BID) | ORAL | Status: DC

## 2014-03-17 MED ORDER — BECLOMETHASONE DIPROPIONATE 40 MCG/ACT IN AERS: 2.0000 | INHALATION_SPRAY | Freq: Two times a day (BID) | RESPIRATORY_TRACT | Status: DC

## 2014-03-17 NOTE — Progress Notes (Signed)
Subjective:    Patient ID: Makayla Good, female    DOB: 1970/10/27, 43 y.o.   MRN: [id number]  HPI   1 day of nasal congestion, sneezing and productive cough. Runny nose and some reported faint sinus pressure. No fever, no sweats. Feels mild tired. No myalgias.   Pt has used her inhaler twice today. About a month since inhaler used.   Pt wonders if her allergies have flared. She is not on her nasonex. She states years since she has used.Pt not on any antihistamine.  LMP- last Thursday.  Past Medical History  Diagnosis Date  . Hyperlipidemia   . Hypertension   . Asthma   . Polycystic ovary     Left  . Eczema   . Obesity     History   Social History  . Marital Status: Single    Spouse Name: N/A    Number of Children: N/A  . Years of Education: 13   Occupational History  . 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 Smoking status: Current Some Day Smoker -- 0.50 packs/day for 16 years    Types: Cigarettes    Start date: 03/23/1993  . Smokeless tobacco: Never Used     Comment: smokeless cigarettes  . Alcohol Use: Yes  . Drug Use: No  . Sexual Activity:    Partners: Male     Comment: been with same person over 20 years   Other Topics Concern  . Not on file   Social History Narrative   Exercise--gym, every other day--- at least 3 days a week    Past Surgical History  Procedure Laterality Date  . Cesarean section  02/01/2005  . Tubal ligation  02/01/2005    Family History  Problem Relation Age of Onset  . Hypertension Mother   . Schizophrenia Mother   . Dementia Mother   . Mental illness Mother     schizophrenia, dementia  . Hypertension Father   . Coronary artery disease Father     Stent  . Hypertension Sister   . Kidney disease    . Hypertension Sister   . Arthritis Sister     rheumatoid    No Known Allergies  Current Outpatient Prescriptions on File Prior to Visit  Medication Sig Dispense Refill  . albuterol  (PROVENTIL HFA;VENTOLIN HFA) 108 (90 BASE) MCG/ACT inhaler Inhale 2 puffs into the lungs every 6 (six) hours as needed for wheezing or shortness of breath. 1 Inhaler 2  . albuterol (VENTOLIN HFA) 108 (90 BASE) MCG/ACT inhaler Inhale 2 puffs into the lungs every 6 (six) hours as needed for wheezing. 1 Inhaler 1  . amLODipine (NORVASC) 10 MG tablet take 1 tablet by mouth once daily 90 tablet 1  . hydrochlorothiazide (HYDRODIURIL) 25 MG tablet take 1 tablet by mouth once daily 90 tablet 1  . HYDROcodone-homatropine (HYCODAN) 5-1.5 MG/5ML syrup Take 5 mLs by mouth every 8 (eight) hours as needed for cough. 120 mL 0  . metoprolol succinate (TOPROL-XL) 25 MG 24 hr tablet take 1 tablet by mouth once daily 90 tablet 1  . potassium chloride SA (K-DUR,KLOR-CON) 20 MEQ tablet Take 1 tablet (20 mEq total) by mouth daily. 30 tablet 3   No current facility-administered medications on file prior to visit.    BP 123/84 mmHg  Pulse 105  Temp(Src) 99.3 F (37.4 C) (Oral)  Ht 5' 5.2" (1.656 m)  Wt 275 lb 9.6 oz (125.011 kg)  BMI 45.59  kg/m2  SpO2 93%  LMP 03/11/2014    Review of Systems  Constitutional: Positive for fatigue. Negative for fever and chills.  HENT: Positive for congestion, rhinorrhea, sinus pressure and sneezing. Negative for ear discharge, ear pain, nosebleeds, postnasal drip, sore throat and trouble swallowing.   Respiratory: Positive for cough. Negative for chest tightness, shortness of breath and wheezing.   Cardiovascular: Negative for chest pain and palpitations.  Gastrointestinal: Negative for nausea and vomiting.  Genitourinary: Negative for dysuria and flank pain.  Musculoskeletal: Negative for back pain.  Neurological: Negative for dizziness and headaches.  Hematological: Negative for adenopathy. Does not bruise/bleed easily.       Objective:   Physical Exam   General  Mental Status - Alert. General Appearance - Well groomed. Not in acute distress.  Skin Rashes- No  Rashes.  HEENT Head- Normal. Ear Auditory Canal - Left- Normal. Right - Normal.Tympanic Membrane- Left- Normal. Right- Normal. Eye Sclera/Conjunctiva- Left- Normal. Right- Normal. Nose & Sinuses Nasal Mucosa- Left-  Boggy + Congested. Right- Boggy + Congested. Prominent nasal congestion Mouth & Throat Lips: Upper Lip- Normal: no dryness, cracking, pallor, cyanosis, or vesicular eruption. Lower Lip-Normal: no dryness, cracking, pallor, cyanosis or vesicular eruption. Buccal Mucosa- Bilateral- No Aphthous ulcers. Oropharynx- No Discharge or Erythema. Tonsils: Characteristics- Bilateral- No Erythema or Congestion. Size/Enlargement- Bilateral- No enlargement. Discharge- bilateral-None.  Neck Neck- Supple. No Masses.   Chest and Lung Exam Auscultation: Breath Sounds:-Normal. Clear even and unlabored  Cardiovascular Auscultation:Rythm- Regular. Rate and rhythm Murmurs & Other Heart Sounds:Ausculatation of the heart reveal- No Murmurs.  Lymphatic Head & Neck General Head & Neck Lymphatics: Bilateral: Description- No Localized lymphadenopathy.         Assessment & Plan:

## 2014-03-17 NOTE — Progress Notes (Signed)
Pre visit review using our clinic review tool, if applicable. No additional management support is needed unless otherwise documented below in the visit note. 

## 2014-03-17 NOTE — Patient Instructions (Addendum)
You appear to have uri vs allergic rhinitis. I am not convinced that you have sinusitis or bronchitis. But you do have hx of asthma and some wheezing recently.  I am prescribing you benzonatate for cough, qvar inhaler for wheezing, nasonex for nasal congestion and augmentin antibiotic.(antibiotic to use only if you worsen over the thanksgiving holiday as discussed.)   Follow up in 7 days or as needed.  If using qvar daily and albuterol very frequently then notify us.

## 2014-03-17 NOTE — Assessment & Plan Note (Signed)
Patient appears to have uri vs allergic rhinitis. I am not convinced that you have sinusitis or bronchitis. But you do have hx of asthma and some wheezing recently.  I am prescribing you benzonatate for cough, qvar inhaler for wheezing, nasonex for nasal congestion and augmentin antibiotic.(antibiotic to use only if you worsen over the thanksgiving holiday as discussed.)

## 2014-04-24 ENCOUNTER — Other Ambulatory Visit: Payer: Self-pay | Admitting: Family Medicine

## 2014-05-21 ENCOUNTER — Other Ambulatory Visit: Payer: Self-pay

## 2014-05-21 MED ORDER — METOPROLOL SUCCINATE ER 25 MG PO TB24: 25.0000 mg | ORAL_TABLET | Freq: Every day | ORAL | Status: DC

## 2014-07-26 ENCOUNTER — Other Ambulatory Visit: Payer: Self-pay | Admitting: Family Medicine

## 2014-07-26 ENCOUNTER — Encounter: Payer: Self-pay | Admitting: Family Medicine

## 2014-07-26 ENCOUNTER — Ambulatory Visit (INDEPENDENT_AMBULATORY_CARE_PROVIDER_SITE_OTHER): Payer: BLUE CROSS/BLUE SHIELD | Admitting: Family Medicine

## 2014-07-26 ENCOUNTER — Telehealth: Payer: Self-pay | Admitting: Family Medicine

## 2014-07-26 VITALS — BP 137/90 | HR 99 | Temp 98.3°F | Wt 285.0 lb

## 2014-07-26 DIAGNOSIS — J302 Other seasonal allergic rhinitis: Secondary | ICD-10-CM | POA: Diagnosis not present

## 2014-07-26 DIAGNOSIS — R319 Hematuria, unspecified: Secondary | ICD-10-CM | POA: Diagnosis not present

## 2014-07-26 DIAGNOSIS — N390 Urinary tract infection, site not specified: Secondary | ICD-10-CM | POA: Diagnosis not present

## 2014-07-26 DIAGNOSIS — R82998 Other abnormal findings in urine: Secondary | ICD-10-CM

## 2014-07-26 DIAGNOSIS — N926 Irregular menstruation, unspecified: Secondary | ICD-10-CM | POA: Diagnosis not present

## 2014-07-26 LAB — POCT URINALYSIS DIPSTICK
Bilirubin, UA: NEGATIVE
Glucose, UA: NEGATIVE
Ketones, UA: NEGATIVE
Nitrite, UA: NEGATIVE
Protein, UA: NEGATIVE
Spec Grav, UA: 1.025
Urobilinogen, UA: 2
pH, UA: 6

## 2014-07-26 LAB — POCT URINE PREGNANCY: Preg Test, Ur: NEGATIVE

## 2014-07-26 MED ORDER — POTASSIUM CHLORIDE CRYS ER 20 MEQ PO TBCR: 20.0000 meq | EXTENDED_RELEASE_TABLET | Freq: Every day | ORAL | Status: DC

## 2014-07-26 MED ORDER — MOMETASONE FUROATE 50 MCG/ACT NA SUSP: 2.0000 | Freq: Every day | NASAL | Status: DC

## 2014-07-26 NOTE — Telephone Encounter (Signed)
emmi emailed °

## 2014-07-26 NOTE — Assessment & Plan Note (Signed)
Only occurred 1x If occurs again--- consider [place] vs gyn Check labs

## 2014-07-26 NOTE — Progress Notes (Signed)
Patient ID: Makayla Good, female    DOB: 10/30/1970  Age: 44 y.o. MRN: [id number]    Subjective:  Subjective HPI Makayla Good presents for  Irregular bleeding.    Review of Systems  Constitutional: Negative for activity change, appetite change, fatigue and unexpected weight change.  Respiratory: Negative for cough and shortness of breath.   Cardiovascular: Negative for chest pain and palpitations.  Gastrointestinal: Negative for abdominal pain, diarrhea, constipation, blood in stool, abdominal distention and anal bleeding.  Genitourinary: Positive for menstrual problem. Negative for urgency, decreased urine volume, vaginal discharge and vaginal pain.       Pt has had regular periods until March--- she started having clotting every few day s after period.   No abd pain --- some cramping like she was going to have her period.  Psychiatric/Behavioral: Negative for behavioral problems and dysphoric mood. The patient is not nervous/anxious.     History Past Medical History  Diagnosis Date  . Hyperlipidemia   . Hypertension   . Asthma   . Polycystic ovary     Left  . Eczema   . Obesity     She has past surgical history that includes Cesarean section (02/01/2005) and Tubal ligation (02/01/2005).   Her family history includes Arthritis in her sister; Coronary artery disease in her father; Dementia in her mother; Hypertension in her father, mother, sister, and sister; Kidney disease in an other family member; Mental illness in her mother; Schizophrenia in her mother.She reports that she has been smoking Cigarettes.  She started smoking about 21 years ago. She has a 8 pack-year smoking history. She has never used smokeless tobacco. She reports that she drinks alcohol. She reports that she does not use illicit drugs.  Current Outpatient Prescriptions on File Prior to Visit  Medication Sig Dispense Refill  . albuterol (PROVENTIL HFA;VENTOLIN HFA) 108 (90 BASE) MCG/ACT inhaler  Inhale 2 puffs into the lungs every 6 (six) hours as needed for wheezing or shortness of breath. 1 Inhaler 2  . albuterol (VENTOLIN HFA) 108 (90 BASE) MCG/ACT inhaler Inhale 2 puffs into the lungs every 6 (six) hours as needed for wheezing. 1 Inhaler 1  . amLODipine (NORVASC) 10 MG tablet take 1 tablet by mouth once daily 90 tablet 1  . beclomethasone (QVAR) 40 MCG/ACT inhaler Inhale 2 puffs into the lungs 2 (two) times daily. 1 Inhaler 0  . hydrochlorothiazide (HYDRODIURIL) 25 MG tablet take 1 tablet by mouth once daily 90 tablet 1  . metoprolol succinate (TOPROL-XL) 25 MG 24 hr tablet Take 1 tablet (25 mg total) by mouth daily. 90 tablet 1   No current facility-administered medications on file prior to visit.     Objective:  Objective Physical Exam  Constitutional: She is oriented to person, place, and time. She appears well-developed and well-nourished. No distress.  HENT:  Right Ear: External ear normal.  Left Ear: External ear normal.  Nose: Nose normal.  Mouth/Throat: Oropharynx is clear and moist.  Eyes: EOM are normal. Pupils are equal, round, and reactive to light.  Neck: Normal range of motion. Neck supple.  Cardiovascular: Normal rate, regular rhythm and normal heart sounds.   No murmur heard. Pulmonary/Chest: Effort normal and breath sounds normal. No respiratory distress. She has no wheezes. She has no rales. She exhibits no tenderness.  Neurological: She is alert and oriented to person, place, and time.  Psychiatric: She has a normal mood and affect. Her behavior is normal. Judgment and thought content normal.  BP 137/90 mmHg  Pulse 99  Temp(Src) 98.3 F (36.8 C) (Oral)  Wt 285 lb (129.275 kg)  SpO2 95%  LMP 07/05/2014 Wt Readings from Last 3 Encounters:  07/26/14 285 lb (129.275 kg)  03/17/14 275 lb 9.6 oz (125.011 kg)  02/15/14 278 lb 3.2 oz (126.191 kg)     Lab Results  Component Value Date   WBC 7.9 08/05/2013   HGB 13.8 08/05/2013   HCT 41.4 08/05/2013    PLT 246.0 08/05/2013   GLUCOSE 96 11/04/2013   CHOL 162 11/04/2013   TRIG 147.0 11/04/2013   HDL 37.60* 11/04/2013   LDLCALC 95 11/04/2013   ALT 22 11/04/2013   AST 20 11/04/2013   NA 137 11/04/2013   K 3.2* 11/04/2013   CL 103 11/04/2013   CREATININE 0.7 11/04/2013   BUN 10 11/04/2013   CO2 25 11/04/2013   TSH 1.74 08/05/2013   MICROALBUR 0.5 08/05/2013    Dg Chest 2 View  02/15/2014   CLINICAL DATA:  Cough and congestion for 1 week.  EXAM: CHEST  2 VIEW  COMPARISON:  01/16/2012.  FINDINGS: Trachea is midline. Heart size normal. Lungs are clear. No pleural fluid.  IMPRESSION: No acute findings.   Electronically Signed   By: Lorin Picket M.D.   On: 02/15/2014 09:45     Assessment & Plan:  Plan I have discontinued Ms. Bonsignore's HYDROcodone-homatropine, benzonatate, and amoxicillin-clavulanate. I have also changed her potassium chloride SA. Additionally, I am having her maintain her albuterol, amLODipine, albuterol, beclomethasone, hydrochlorothiazide, metoprolol succinate, and mometasone.  Meds ordered this encounter  Medications  . mometasone (NASONEX) 50 MCG/ACT nasal spray    Sig: Place 2 sprays into the nose daily.    Dispense:  17 g    Refill:  2  . potassium chloride SA (K-DUR,KLOR-CON) 20 MEQ tablet    Sig: Take 1 tablet (20 mEq total) by mouth daily.    Dispense:  90 tablet    Refill:  1    Problem List Items Addressed This Visit    Irregular periods    Only occurred 1x If occurs again--- consider [place] vs gyn Check labs       Other Visit Diagnoses    Seasonal allergies    -  Primary    Relevant Medications    mometasone (NASONEX) 50 MCG/ACT nasal spray    Irregular periods/menstrual cycles        Relevant Orders    POCT urine pregnancy (Completed)    Basic metabolic panel    CBC with Differential/Platelet    Hepatic function panel    TSH    T3, free    T4, free    Estradiol    FSH    LH    POCT urine pregnancy    Hematuria        Relevant  Orders    POCT Urinalysis Dipstick (Completed)    Leukocytes in urine        Relevant Orders    Urine Culture       Follow-up: No Follow-up on file.  [name], DO

## 2014-07-26 NOTE — Progress Notes (Signed)
Pre visit review using our clinic review tool, if applicable. No additional management support is needed unless otherwise documented below in the visit note. 

## 2014-07-27 LAB — CBC WITH DIFFERENTIAL/PLATELET
Basophils Absolute: 0.1 10*3/uL (ref 0.0–0.1)
Basophils Relative: 0.7 % (ref 0.0–3.0)
Eosinophils Absolute: 0.3 10*3/uL (ref 0.0–0.7)
Eosinophils Relative: 3.1 % (ref 0.0–5.0)
HCT: 41 % (ref 36.0–46.0)
Hemoglobin: 13.8 g/dL (ref 12.0–15.0)
Lymphocytes Relative: 40.5 % (ref 12.0–46.0)
Lymphs Abs: 3.5 10*3/uL (ref 0.7–4.0)
MCHC: 33.6 g/dL (ref 30.0–36.0)
MCV: 94.1 fl (ref 78.0–100.0)
Monocytes Absolute: 0.6 10*3/uL (ref 0.1–1.0)
Monocytes Relative: 7.1 % (ref 3.0–12.0)
Neutro Abs: 4.2 10*3/uL (ref 1.4–7.7)
Neutrophils Relative %: 48.6 % (ref 43.0–77.0)
Platelets: 183 10*3/uL (ref 150.0–400.0)
RBC: 4.35 Mil/uL (ref 3.87–5.11)
RDW: 13.8 % (ref 11.5–15.5)
WBC: 8.7 10*3/uL (ref 4.0–10.5)

## 2014-07-27 LAB — T3, FREE: T3, Free: 2.7 pg/mL (ref 2.3–4.2)

## 2014-07-27 LAB — BASIC METABOLIC PANEL
BUN: 10 mg/dL (ref 6–23)
CO2: 25 mEq/L (ref 19–32)
Calcium: 9.8 mg/dL (ref 8.4–10.5)
Chloride: 103 mEq/L (ref 96–112)
Creatinine, Ser: 0.68 mg/dL (ref 0.40–1.20)
GFR: 121.04 mL/min (ref >60.00)
Glucose, Bld: 98 mg/dL (ref 70–99)
Potassium: 3.6 mEq/L (ref 3.5–5.1)
Sodium: 139 mEq/L (ref 135–145)

## 2014-07-27 LAB — HEPATIC FUNCTION PANEL
ALT: 20 U/L (ref 0–35)
AST: 19 U/L (ref 0–37)
Albumin: 4.1 g/dL (ref 3.5–5.2)
Alkaline Phosphatase: 69 U/L (ref 39–117)
Bilirubin, Direct: 0.1 mg/dL (ref 0.0–0.3)
Total Bilirubin: 0.3 mg/dL (ref 0.2–1.2)
Total Protein: 7.6 g/dL (ref 6.0–8.3)

## 2014-07-27 LAB — FOLLICLE STIMULATING HORMONE: FSH: 4.2 m[IU]/mL

## 2014-07-27 LAB — ESTRADIOL: Estradiol: 128.7 pg/mL

## 2014-07-27 LAB — LUTEINIZING HORMONE: LH: 7.72 m[IU]/mL

## 2014-07-27 LAB — TSH: TSH: 3.06 u[IU]/mL (ref 0.35–4.50)

## 2014-07-27 LAB — T4, FREE: Free T4: 0.78 ng/dL (ref 0.60–1.60)

## 2014-07-28 LAB — URINE CULTURE: Colony Count: 70000

## 2014-07-29 LAB — ESTRADIOL: Estradiol: 121.4 pg/mL

## 2014-08-18 ENCOUNTER — Other Ambulatory Visit: Payer: Self-pay | Admitting: Family Medicine

## 2014-08-20 ENCOUNTER — Ambulatory Visit: Payer: Managed Care, Other (non HMO) | Admitting: Family Medicine

## 2014-09-27 ENCOUNTER — Encounter: Payer: Self-pay | Admitting: Family Medicine

## 2014-09-27 ENCOUNTER — Ambulatory Visit (INDEPENDENT_AMBULATORY_CARE_PROVIDER_SITE_OTHER): Payer: BLUE CROSS/BLUE SHIELD | Admitting: Family Medicine

## 2014-09-27 VITALS — BP 130/80 | HR 100 | Temp 99.4°F | Resp 18 | Wt 282.0 lb

## 2014-09-27 DIAGNOSIS — N939 Abnormal uterine and vaginal bleeding, unspecified: Secondary | ICD-10-CM

## 2014-09-27 NOTE — Progress Notes (Signed)
Subjective:    Patient ID: Makayla Good, female    DOB: 24-Sep-1970, 44 y.o.   MRN: [id number]  HPI  Patient here c/o irregular periods again.  No abd pain.     Past Medical History  Diagnosis Date  . Hyperlipidemia   . Hypertension   . Asthma   . Polycystic ovary     Left  . Eczema   . Obesity     Review of Systems  Constitutional: Negative for diaphoresis, appetite change, fatigue and unexpected weight change.  Eyes: Negative for pain, redness and visual disturbance.  Respiratory: Negative for cough, chest tightness, shortness of breath and wheezing.   Cardiovascular: Negative for chest pain, palpitations and leg swelling.  Gastrointestinal: Negative for abdominal pain and blood in stool.  Endocrine: Negative for cold intolerance, heat intolerance, polydipsia, polyphagia and polyuria.  Genitourinary: Negative for dysuria, frequency and difficulty urinating.  Neurological: Negative for dizziness, light-headedness, numbness and headaches.  Psychiatric/Behavioral: Negative for dysphoric mood. The patient is not nervous/anxious.     Current Outpatient Prescriptions on File Prior to Visit  Medication Sig Dispense Refill  . albuterol (PROVENTIL HFA;VENTOLIN HFA) 108 (90 BASE) MCG/ACT inhaler Inhale 2 puffs into the lungs every 6 (six) hours as needed for wheezing or shortness of breath. 1 Inhaler 2  . amLODipine (NORVASC) 10 MG tablet take 1 tablet by mouth once daily 90 tablet 1  . beclomethasone (QVAR) 40 MCG/ACT inhaler Inhale 2 puffs into the lungs 2 (two) times daily. 1 Inhaler 0  . hydrochlorothiazide (HYDRODIURIL) 25 MG tablet take 1 tablet by mouth once daily 90 tablet 1  . metoprolol succinate (TOPROL-XL) 25 MG 24 hr tablet Take 1 tablet (25 mg total) by mouth daily. 90 tablet 1  . mometasone (NASONEX) 50 MCG/ACT nasal spray Place 2 sprays into the nose daily. 17 g 2  . potassium chloride SA (K-DUR,KLOR-CON) 20 MEQ tablet Take 1 tablet (20 mEq total) by mouth  daily. 90 tablet 1  . albuterol (VENTOLIN HFA) 108 (90 BASE) MCG/ACT inhaler Inhale 2 puffs into the lungs every 6 (six) hours as needed for wheezing. 1 Inhaler 1   No current facility-administered medications on file prior to visit.       Objective:    Physical Exam  Constitutional: She is oriented to person, place, and time. She appears well-developed and well-nourished.  Neck: No JVD present. Carotid bruit is not present. No thyromegaly present.  Pulmonary/Chest: Effort normal and breath sounds normal. No respiratory distress.  Neurological: She is alert and oriented to person, place, and time.  Psychiatric: She has a normal mood and affect. Her behavior is normal.    BP 130/80 mmHg  Pulse 100  Temp(Src) 99.4 F (37.4 C) (Oral)  Resp 18  Wt 282 lb (127.914 kg)  SpO2 99% Wt Readings from Last 3 Encounters:  09/27/14 282 lb (127.914 kg)  07/26/14 285 lb (129.275 kg)  03/17/14 275 lb 9.6 oz (125.011 kg)     Lab Results  Component Value Date   WBC 8.7 07/26/2014   HGB 13.8 07/26/2014   HCT 41.0 07/26/2014   PLT 183.0 07/26/2014   GLUCOSE 98 07/26/2014   CHOL 162 11/04/2013   TRIG 147.0 11/04/2013   HDL 37.60* 11/04/2013   LDLCALC 95 11/04/2013   ALT 20 07/26/2014   AST 19 07/26/2014   NA 139 07/26/2014   K 3.6 07/26/2014   CL 103 07/26/2014   CREATININE 0.68 07/26/2014   BUN 10 07/26/2014  CO2 25 07/26/2014   TSH 3.06 07/26/2014   MICROALBUR 0.5 08/05/2013       Assessment & Plan:   Problem List Items Addressed This Visit    None    Visit Diagnoses    Vaginal bleeding, abnormal    -  Primary    Relevant Orders    US Pelvis Complete    US Transvaginal Non-OB       I am having [name] maintain her albuterol, albuterol, beclomethasone, hydrochlorothiazide, metoprolol succinate, mometasone, potassium chloride SA, and amLODipine.  No orders of the defined types were placed in this encounter.     [name], DO

## 2014-09-27 NOTE — Patient Instructions (Signed)
Abnormal Uterine Bleeding Abnormal uterine bleeding can affect women at various stages in life, including teenagers, women in their reproductive years, pregnant women, and women who have reached menopause. Several kinds of uterine bleeding are considered abnormal, including:  Bleeding or spotting between periods.   Bleeding after sexual intercourse.   Bleeding that is heavier or more than normal.   Periods that last longer than usual.  Bleeding after menopause.  Many cases of abnormal uterine bleeding are minor and simple to treat, while others are more serious. Any type of abnormal bleeding should be evaluated by your health care provider. Treatment will depend on the cause of the bleeding. HOME CARE INSTRUCTIONS Monitor your condition for any changes. The following actions may help to alleviate any discomfort you are experiencing:  Avoid the use of tampons and douches as directed by your health care provider.  Change your pads frequently. You should get regular pelvic exams and Pap tests. Keep all follow-up appointments for diagnostic tests as directed by your health care provider.  SEEK MEDICAL CARE IF:   Your bleeding lasts more than 1 week.   You feel dizzy at times.  SEEK IMMEDIATE MEDICAL CARE IF:   You pass out.   You are changing pads every 15 to 30 minutes.   You have abdominal pain.  You have a fever.   You become sweaty or weak.   You are passing large blood clots from the vagina.   You start to feel nauseous and vomit. MAKE SURE YOU:   Understand these instructions.  Will watch your condition.  Will get help right away if you are not doing well or get worse. Document Released: 04/09/2005 Document Revised: 04/14/2013 Document Reviewed: 11/06/2012 Exit[employer] Patient Information 2015 [employer]. This information is not intended to replace advice given to you by your health care provider. Make sure you discuss any questions you have with your  health care provider.  

## 2014-09-27 NOTE — Progress Notes (Signed)
Pre visit review using our clinic review tool, if applicable. No additional management support is needed unless otherwise documented below in the visit note. 

## 2014-09-28 ENCOUNTER — Ambulatory Visit (HOSPITAL_BASED_OUTPATIENT_CLINIC_OR_DEPARTMENT_OTHER)
Admission: RE | Admit: 2014-09-28 | Discharge: 2014-09-28 | Disposition: A | Discharge status: Home / Self Care | Payer: BLUE CROSS/BLUE SHIELD | Source: Ambulatory Visit | Attending: Family Medicine | Admitting: Family Medicine

## 2014-09-28 DIAGNOSIS — N939 Abnormal uterine and vaginal bleeding, unspecified: Secondary | ICD-10-CM

## 2014-09-28 DIAGNOSIS — N938 Other specified abnormal uterine and vaginal bleeding: Secondary | ICD-10-CM | POA: Diagnosis not present

## 2014-10-02 ENCOUNTER — Encounter (HOSPITAL_COMMUNITY): Payer: Self-pay | Admitting: *Deleted

## 2014-10-02 ENCOUNTER — Inpatient Hospital Stay (HOSPITAL_COMMUNITY)
Admission: AD | Admit: 2014-10-02 | Discharge: 2014-10-02 | Disposition: A | Discharge status: Home / Self Care | Payer: BLUE CROSS/BLUE SHIELD | Source: Ambulatory Visit | Attending: Obstetrics and Gynecology | Admitting: Obstetrics and Gynecology

## 2014-10-02 DIAGNOSIS — I10 Essential (primary) hypertension: Secondary | ICD-10-CM | POA: Diagnosis not present

## 2014-10-02 DIAGNOSIS — F17210 Nicotine dependence, cigarettes, uncomplicated: Secondary | ICD-10-CM | POA: Insufficient documentation

## 2014-10-02 DIAGNOSIS — N939 Abnormal uterine and vaginal bleeding, unspecified: Secondary | ICD-10-CM | POA: Insufficient documentation

## 2014-10-02 DIAGNOSIS — N841 Polyp of cervix uteri: Secondary | ICD-10-CM | POA: Insufficient documentation

## 2014-10-02 HISTORY — DX: Reserved for concepts with insufficient information to code with codable children: IMO0002

## 2014-10-02 LAB — CBC WITH DIFFERENTIAL/PLATELET
Basophils Absolute: 0 10*3/uL (ref 0.0–0.1)
Basophils Relative: 0 % (ref 0–1)
Eosinophils Absolute: 0.2 10*3/uL (ref 0.0–0.7)
Eosinophils Relative: 2 % (ref 0–5)
HCT: 38.5 % (ref 36.0–46.0)
Hemoglobin: 13.3 g/dL (ref 12.0–15.0)
Lymphocytes Relative: 47 % — ABNORMAL HIGH (ref 12–46)
Lymphs Abs: 4.4 10*3/uL — ABNORMAL HIGH (ref 0.7–4.0)
MCH: 32.4 pg (ref 26.0–34.0)
MCHC: 34.5 g/dL (ref 30.0–36.0)
MCV: 93.9 fL (ref 78.0–100.0)
Monocytes Absolute: 0.8 10*3/uL (ref 0.1–1.0)
Monocytes Relative: 9 % (ref 3–12)
Neutro Abs: 3.9 10*3/uL (ref 1.7–7.7)
Neutrophils Relative %: 42 % — ABNORMAL LOW (ref 43–77)
Platelets: 212 10*3/uL (ref 150–400)
RBC: 4.1 MIL/uL (ref 3.87–5.11)
RDW: 14.2 % (ref 11.5–15.5)
WBC: 9.3 10*3/uL (ref 4.0–10.5)

## 2014-10-02 NOTE — MAU Provider Note (Signed)
History     CSN: [id number]  Arrival date and time: 10/02/14 1909   None     Chief Complaint  Patient presents with  . Vaginal Bleeding   HPI  [name] is a [id number] here with report of vaginal bleeding.  Bleeding started 4 months ago.  Bleeding is described as moderate and intermittent in nature.  Report no abdominal cramping.  States that prior to 4 months ago menstrual cycles were normal and occurred every 4 months.  Now bleeding is unpredictable in nature and producing large clots.  Ultrasound completed on 09/28/2014 showed Normal endometrial thickness and no abnormalities and recommended if bleeding remains unresponsive to hormonal or medical therapy, sonohysterogram should be considered for focal lesion work-up.    Past Medical History  Diagnosis Date  . Hyperlipidemia   . Hypertension   . Asthma   . Polycystic ovary     Left  . Eczema   . Obesity   . H/O oophorectomy     Past Surgical History  Procedure Laterality Date  . Cesarean section  02/01/2005  . Tubal ligation  02/01/2005    Family History  Problem Relation Age of Onset  . Hypertension Mother   . Schizophrenia Mother   . Dementia Mother   . Mental illness Mother     schizophrenia, dementia  . Hypertension Father   . Coronary artery disease Father     Stent  . Hypertension Sister   . Kidney disease    . Hypertension Sister   . Arthritis Sister     rheumatoid    History  Substance Use Topics  . Smoking status: Current Some Day Smoker -- 0.50 packs/day for 16 years    Types: Cigarettes    Start date: 03/23/1993  . Smokeless tobacco: Never Used     Comment: smokeless cigarettes  . Alcohol Use: Yes    Allergies: No Known Allergies  Prescriptions prior to admission  Medication Sig Dispense Refill Last Dose  . amLODipine (NORVASC) 10 MG tablet take 1 tablet by mouth once daily 90 tablet 1 10/02/2014 at Unknown time  . beclomethasone (QVAR) 40 MCG/ACT inhaler Inhale 2 puffs into  the lungs 2 (two) times daily. 1 Inhaler 0 10/02/2014 at Unknown time  . hydrochlorothiazide (HYDRODIURIL) 25 MG tablet take 1 tablet by mouth once daily 90 tablet 1 10/02/2014 at Unknown time  . metoprolol succinate (TOPROL-XL) 25 MG 24 hr tablet Take 1 tablet (25 mg total) by mouth daily. 90 tablet 1 10/02/2014 at Unknown time  . Multiple Vitamin (MULTIVITAMIN) capsule Take 1 capsule by mouth daily.   10/02/2014 at Unknown time  . potassium chloride SA (K-DUR,KLOR-CON) 20 MEQ tablet Take 1 tablet (20 mEq total) by mouth daily. 90 tablet 1 10/02/2014 at Unknown time  . albuterol (PROVENTIL HFA;VENTOLIN HFA) 108 (90 BASE) MCG/ACT inhaler Inhale 2 puffs into the lungs every 6 (six) hours as needed for wheezing or shortness of breath. 1 Inhaler 2 More than a month at Unknown time  . albuterol (VENTOLIN HFA) 108 (90 BASE) MCG/ACT inhaler Inhale 2 puffs into the lungs every 6 (six) hours as needed for wheezing. 1 Inhaler 1 Taking  . mometasone (NASONEX) 50 MCG/ACT nasal spray Place 2 sprays into the nose daily. 17 g 2 Taking    Review of Systems  Respiratory: Negative for shortness of breath.   Cardiovascular: Negative for chest pain and palpitations.  Gastrointestinal: Negative for abdominal pain.  Genitourinary: Negative for dysuria, urgency and frequency.  Vaginal bleeding  Neurological: Negative for dizziness and headaches.  All other systems reviewed and are negative.  Physical Exam   Blood pressure 129/83, pulse 102, temperature 98.4 F (36.9 C), resp. rate 18, height 5' 5.5" (1.664 m), weight 126.463 kg (278 lb 12.8 oz), last menstrual period 09/06/2014, SpO2 97 %.  Physical Exam  Constitutional: She is oriented to person, place, and time. She appears well-developed and well-nourished. No distress.  HENT:  Head: Normocephalic.  Neck: Normal range of motion. Neck supple.  Cardiovascular: Normal rate, regular rhythm and normal heart sounds.   Respiratory: Effort normal and breath  sounds normal.  GI: Soft. There is no tenderness.  Genitourinary: There is bleeding (moderate bleeding with clots) in the vagina.    Musculoskeletal: Normal range of motion.  Neurological: She is alert and oriented to person, place, and time. She has normal reflexes.  Skin: Skin is warm and dry. No pallor.   Cervical polyp removed with twisting of ring forceps; minimal bleeding seen after removal.   MAU Course  Procedures  Results for orders placed or performed during the hospital encounter of 10/02/14 (from the past 24 hour(s))  CBC with Differential/Platelet     Status: Abnormal   Collection Time: 10/02/14  8:00 PM  Result Value Ref Range   WBC 9.3 4.0 - 10.5 K/uL   RBC 4.10 3.87 - 5.11 MIL/uL   Hemoglobin 13.3 12.0 - 15.0 g/dL   HCT 38.5 36.0 - 46.0 %   MCV 93.9 78.0 - 100.0 fL   MCH 32.4 26.0 - 34.0 pg   MCHC 34.5 30.0 - 36.0 g/dL   RDW 14.2 11.5 - 15.5 %   Platelets 212 150 - 400 K/uL   Neutrophils Relative % 42 (L) 43 - 77 %   Neutro Abs 3.9 1.7 - 7.7 K/uL   Lymphocytes Relative 47 (H) 12 - 46 %   Lymphs Abs 4.4 (H) 0.7 - 4.0 K/uL   Monocytes Relative 9 3 - 12 %   Monocytes Absolute 0.8 0.1 - 1.0 K/uL   Eosinophils Relative 2 0 - 5 %   Eosinophils Absolute 0.2 0.0 - 0.7 K/uL   Basophils Relative 0 0 - 1 %   Basophils Absolute 0.0 0.0 - 0.1 K/uL   Consulted with Dr. Elly Modena > Reviewed HPI/Exam/labs > agrees with plan of care  Assessment and Plan  Cervical Polyp  Plan: Sent polyp to lab for pathology Bleeding precautions Follow-up with GYN if bleeding returns for further managment  Gwen Pounds, CNM

## 2014-10-02 NOTE — Discharge Instructions (Signed)
CERVICAL POLYP  What Is It? The cervix is a tubelike channel that connects the uterus to the vagina. Cervical polyps are growths that usually appear on the cervix where it opens into the vagina. Polyps are usually cherry-red to reddish-purple or grayish-white. They vary in size and often look like bulbs on thin stems. Cervical polyps are usually not cancerous (benign) and can occur alone or in groups. Most polyps are small, about 1 centimeter to 2 centimeters long. Because rare types of cancerous conditions can look like polyps, all polyps should be removed and examined for signs of cancer.   The cause of cervical polyps is not well understood, but they are associated with inflammation of the cervix. They also may result from an abnormal response to the female hormone estrogen. Cervical polyps are relatively common, especially in women older than 20 who have had at least one child. They are rare in girls who have not started menstruating. There are two types of cervical polyps:  Ectocervical polyps can develop from the outer surface layer cells of the cervix. They are more common in postmenopausal women.  Endocervical polyps develop from cervical glands inside the cervical canal. Most cervical polyps are endocervical polyps, and are more common in premenopausal women.   Symptoms Cervical polyps may not cause any symptoms. However, you may experience:  Discharge, which can be foul-smelling if there is an infection  Bleeding between periods  Heavier bleeding during periods  Bleeding after intercourse  Diagnosis If you have a cervical polyp, you probably won't be able to feel it or see it. Cervical polyps are discovered during routine pelvic exams or evaluations for bleeding or while getting a Pap test.   Expected Duration Sometimes a polyp will come off on its own during sexual intercourse or menstruation. However, most polyps need to be removed to treat any symptoms and to evaluate the tissue  for signs of cancer, which is rare.   Prevention Visit your doctor for an annual Pap test and for regular pelvic exams. A direct examination is the best way to identify cervical polyps.   Treatment Cervical polyps are removed surgically, usually in a doctor's office. The doctor will use a special instrument, called a polyp forceps, to grasp the base of the polyp stem and then gently pluck the polyp with a gentle, twisting motion. Bleeding is usually brief and limited. Nonprescription, mild pain medication such as acetaminophen (Tylenol and others) or ibuprofen (Advil, Motrin and others) can help to relieve discomfort or cramping during or after the procedure.  The polyp or polyps are sent to a laboratory for examination. You may receive antibiotics if the polyp shows signs of infection. If the polyp is cancerous, treatment will depend on the extent and type of cancer.  Large polyps and polyp stems that are very broad usually need to be removed in an operating room using local, regional or general anesthesia. You will not need to stay in the hospital overnight. Cervical polyps may grow in the future from different areas of the cervix, usually not from the original site. Regular pelvic examination will help to identify and treat polyps before they cause symptoms.   When To Call a Professional If you experience vaginal discharge, bleeding after intercourse, or bleeding between periods, make an appointment to see your doctor as soon as possible for a pelvic exam.  Prognosis The outlook is excellent. The vast majority of cervical polyps are not cancerous. Once removed, polyps usually don't come back.

## 2014-10-02 NOTE — MAU Note (Signed)
Had intercourse Thurs night and had a lot of bleeding then. Spotting Friday and today. About 1400 passed some clots. Last hour having to change pad every 63mins. And having big clots. Had u/s Tuesday at [facility] due to irregular periods for 48months. Was told yesterday everything ok with u/s

## 2014-10-19 ENCOUNTER — Telehealth: Payer: Self-pay | Admitting: Family Medicine

## 2014-10-19 NOTE — Telephone Encounter (Signed)
pre visit letter mailed 10/19/14

## 2014-10-29 ENCOUNTER — Other Ambulatory Visit: Payer: Self-pay

## 2014-10-29 DIAGNOSIS — Z1231 Encounter for screening mammogram for malignant neoplasm of breast: Secondary | ICD-10-CM

## 2014-11-05 ENCOUNTER — Telehealth: Payer: Self-pay | Admitting: *Deleted

## 2014-11-05 NOTE — Telephone Encounter (Signed)
Unable to reach patient at time of Pre-Visit Call.  Left message for patient to return call when available.    

## 2014-11-08 ENCOUNTER — Ambulatory Visit (INDEPENDENT_AMBULATORY_CARE_PROVIDER_SITE_OTHER): Payer: BLUE CROSS/BLUE SHIELD | Admitting: Family Medicine

## 2014-11-08 ENCOUNTER — Encounter: Payer: Self-pay | Admitting: Family Medicine

## 2014-11-08 VITALS — BP 134/90 | HR 93 | Temp 98.0°F | Ht 66.0 in | Wt 278.6 lb

## 2014-11-08 DIAGNOSIS — Z0000 Encounter for general adult medical examination without abnormal findings: Secondary | ICD-10-CM

## 2014-11-08 DIAGNOSIS — I10 Essential (primary) hypertension: Secondary | ICD-10-CM

## 2014-11-08 DIAGNOSIS — M25561 Pain in right knee: Secondary | ICD-10-CM | POA: Diagnosis not present

## 2014-11-08 DIAGNOSIS — M25562 Pain in left knee: Secondary | ICD-10-CM

## 2014-11-08 DIAGNOSIS — N939 Abnormal uterine and vaginal bleeding, unspecified: Secondary | ICD-10-CM | POA: Diagnosis not present

## 2014-11-08 LAB — LIPID PANEL
Cholesterol: 163 mg/dL (ref 0–200)
HDL: 42.2 mg/dL (ref >39.00)
LDL Cholesterol: 95 mg/dL (ref 0–99)
NonHDL: 120.8
Total CHOL/HDL Ratio: 4
Triglycerides: 131 mg/dL (ref 0.0–149.0)
VLDL: 26.2 mg/dL (ref 0.0–40.0)

## 2014-11-08 LAB — CBC WITH DIFFERENTIAL/PLATELET
Basophils Absolute: 0.1 10*3/uL (ref 0.0–0.1)
Basophils Relative: 0.9 % (ref 0.0–3.0)
Eosinophils Absolute: 0.1 10*3/uL (ref 0.0–0.7)
Eosinophils Relative: 1.4 % (ref 0.0–5.0)
HCT: 40.3 % (ref 36.0–46.0)
Hemoglobin: 13.5 g/dL (ref 12.0–15.0)
Lymphocytes Relative: 40.7 % (ref 12.0–46.0)
Lymphs Abs: 3.4 10*3/uL (ref 0.7–4.0)
MCHC: 33.5 g/dL (ref 30.0–36.0)
MCV: 95.4 fl (ref 78.0–100.0)
Monocytes Absolute: 0.5 10*3/uL (ref 0.1–1.0)
Monocytes Relative: 6.1 % (ref 3.0–12.0)
Neutro Abs: 4.2 10*3/uL (ref 1.4–7.7)
Neutrophils Relative %: 50.9 % (ref 43.0–77.0)
Platelets: 272 10*3/uL (ref 150.0–400.0)
RBC: 4.23 Mil/uL (ref 3.87–5.11)
RDW: 14.2 % (ref 11.5–15.5)
WBC: 8.3 10*3/uL (ref 4.0–10.5)

## 2014-11-08 LAB — HEPATIC FUNCTION PANEL
ALT: 21 U/L (ref 0–35)
AST: 18 U/L (ref 0–37)
Albumin: 4 g/dL (ref 3.5–5.2)
Alkaline Phosphatase: 70 U/L (ref 39–117)
Bilirubin, Direct: 0.1 mg/dL (ref 0.0–0.3)
Total Bilirubin: 0.5 mg/dL (ref 0.2–1.2)
Total Protein: 7.4 g/dL (ref 6.0–8.3)

## 2014-11-08 LAB — BASIC METABOLIC PANEL
BUN: 8 mg/dL (ref 6–23)
CO2: 28 mEq/L (ref 19–32)
Calcium: 9.8 mg/dL (ref 8.4–10.5)
Chloride: 103 mEq/L (ref 96–112)
Creatinine, Ser: 0.61 mg/dL (ref 0.40–1.20)
GFR: 137.03 mL/min (ref >60.00)
Glucose, Bld: 94 mg/dL (ref 70–99)
Potassium: 3.5 mEq/L (ref 3.5–5.1)
Sodium: 140 mEq/L (ref 135–145)

## 2014-11-08 LAB — TSH: TSH: 2.45 u[IU]/mL (ref 0.35–4.50)

## 2014-11-08 LAB — HIV ANTIBODY (ROUTINE TESTING W REFLEX): HIV 1&2 Ab, 4th Generation: NONREACTIVE

## 2014-11-08 MED ORDER — HYDROCHLOROTHIAZIDE 25 MG PO TABS: 25.0000 mg | ORAL_TABLET | Freq: Every day | ORAL | Status: DC

## 2014-11-08 NOTE — Patient Instructions (Addendum)
Hypertension Hypertension, commonly called high blood pressure, is when the force of blood pumping through your arteries is too strong. Your arteries are the blood vessels that carry blood from your heart throughout your body. A blood pressure reading consists of a higher number over a lower number, such as 110/72. The higher number (systolic) is the pressure inside your arteries when your heart pumps. The lower number (diastolic) is the pressure inside your arteries when your heart relaxes. Ideally you want your blood pressure below 120/80. Hypertension forces your heart to work harder to pump blood. Your arteries may become narrow or stiff. Having hypertension puts you at risk for heart disease, stroke, and other problems.  RISK FACTORS Some risk factors for high blood pressure are controllable. Others are not.  Risk factors you cannot control include:   Race. You may be at higher risk if you are African American.  Age. Risk increases with age.  Gender. Men are at higher risk than women before age 45 years. After age 65, women are at higher risk than men. Risk factors you can control include:  Not getting enough exercise or physical activity.  Being overweight.  Getting too much fat, sugar, calories, or salt in your diet.  Drinking too much alcohol. SIGNS AND SYMPTOMS Hypertension does not usually cause signs or symptoms. Extremely high blood pressure (hypertensive crisis) may cause headache, anxiety, shortness of breath, and nosebleed. DIAGNOSIS  To check if you have hypertension, your health care provider will measure your blood pressure while you are seated, with your arm held at the level of your heart. It should be measured at least twice using the same arm. Certain conditions can cause a difference in blood pressure between your right and left arms. A blood pressure reading that is higher than normal on one occasion does not mean that you need treatment. If one blood pressure reading  is high, ask your health care provider about having it checked again. TREATMENT  Treating high blood pressure includes making lifestyle changes and possibly taking medicine. Living a healthy lifestyle can help lower high blood pressure. You may need to change some of your habits. Lifestyle changes may include:  Following the DASH diet. This diet is high in fruits, vegetables, and whole grains. It is low in salt, red meat, and added sugars.  Getting at least 2 hours of brisk physical activity every week.  Losing weight if necessary.  Not smoking.  Limiting alcoholic beverages.  Learning ways to reduce stress. If lifestyle changes are not enough to get your blood pressure under control, your health care provider may prescribe medicine. You may need to take more than one. Work closely with your health care provider to understand the risks and benefits. HOME CARE INSTRUCTIONS  Have your blood pressure rechecked as directed by your health care provider.   Take medicines only as directed by your health care provider. Follow the directions carefully. Blood pressure medicines must be taken as prescribed. The medicine does not work as well when you skip doses. Skipping doses also puts you at risk for problems.   Do not smoke.   Monitor your blood pressure at home as directed by your health care provider. SEEK MEDICAL CARE IF:   You think you are having a reaction to medicines taken.  You have recurrent headaches or feel dizzy.  You have swelling in your ankles.  You have trouble with your vision. SEEK IMMEDIATE MEDICAL CARE IF:  You develop a severe headache or confusion.    You have unusual weakness, numbness, or feel faint.  You have severe chest or abdominal pain.  You vomit repeatedly.  You have trouble breathing. MAKE SURE YOU:   Understand these instructions.  Will watch your condition.  Will get help right away if you are not doing well or get worse. Document  Released: 04/09/2005 Document Revised: 08/24/2013 Document Reviewed: 01/30/2013 [facility] Patient Information 2015 [place], [place]. This information is not intended to replace advice given to you by your health care provider. Make sure you discuss any questions you have with your health care provider.   Smoking Cessation Quitting smoking is important to your health and has many advantages. However, it is not always easy to quit since nicotine is a very addictive drug. Oftentimes, people try 3 times or more before being able to quit. This document explains the best ways for you to prepare to quit smoking. Quitting takes hard work and a lot of effort, but you can do it. ADVANTAGES OF QUITTING SMOKING  You will live longer, feel better, and live better.  Your body will feel the impact of quitting smoking almost immediately.  Within 20 minutes, blood pressure decreases. Your pulse returns to its normal level.  After 8 hours, carbon monoxide levels in the blood return to normal. Your oxygen level increases.  After 24 hours, the chance of having a heart attack starts to decrease. Your breath, hair, and body stop smelling like smoke.  After 48 hours, damaged nerve endings begin to recover. Your sense of taste and smell improve.  After 72 hours, the body is virtually free of nicotine. Your bronchial tubes relax and breathing becomes easier.  After 2 to 12 weeks, lungs can hold more air. Exercise becomes easier and circulation improves.  The risk of having a heart attack, stroke, cancer, or lung disease is greatly reduced.  After 1 year, the risk of coronary heart disease is cut in half.  After 5 years, the risk of stroke falls to the same as a nonsmoker.  After 10 years, the risk of lung cancer is cut in half and the risk of other cancers decreases significantly.  After 15 years, the risk of coronary heart disease drops, usually to the level of a nonsmoker.  If you are pregnant, quitting  smoking will improve your chances of having a healthy baby.  The people you live with, especially any children, will be healthier.  You will have extra money to spend on things other than cigarettes. QUESTIONS TO THINK ABOUT BEFORE ATTEMPTING TO QUIT You may want to talk about your answers with your health care provider.  Why do you want to quit?  If you tried to quit in the past, what helped and what did not?  What will be the most difficult situations for you after you quit? How will you plan to handle them?  Who can help you through the tough times? Your family? Friends? A health care provider?  What pleasures do you get from smoking? What ways can you still get pleasure if you quit? Here are some questions to ask your health care provider:  How can you help me to be successful at quitting?  What medicine do you think would be best for me and how should I take it?  What should I do if I need more help?  What is smoking withdrawal like? How can I get information on withdrawal? GET READY  Set a quit date.  Change your environment by getting rid of all  cigarettes, ashtrays, matches, and lighters in your home, car, or work. Do not let people smoke in your home.  Review your past attempts to quit. Think about what worked and what did not. GET SUPPORT AND ENCOURAGEMENT You have a better chance of being successful if you have help. You can get support in many ways.  Tell your family, friends, and coworkers that you are going to quit and need their support. Ask them not to smoke around you.  Get individual, group, or telephone counseling and support. Programs are available at [employer] and health centers. Call your local health department for information about programs in your area.  Spiritual beliefs and practices may help some smokers quit.  Download a "quit meter" on your computer to keep track of quit statistics, such as how long you have gone without smoking,  cigarettes not smoked, and money saved.  Get a self-help book about quitting smoking and staying off tobacco. [place] yourself from urges to smoke. Talk to someone, go for a walk, or occupy your time with a task.  Change your normal routine. Take a different route to work. Drink tea instead of coffee. Eat breakfast in a different place.  Reduce your stress. Take a hot bath, exercise, or read a book.  Plan something enjoyable to do every day. Reward yourself for not smoking.  Explore interactive web-based programs that specialize in helping you quit. GET MEDICINE AND USE IT CORRECTLY Medicines can help you stop smoking and decrease the urge to smoke. Combining medicine with the above behavioral methods and support can greatly increase your chances of successfully quitting smoking.  Nicotine replacement therapy helps deliver nicotine to your body without the negative effects and risks of smoking. Nicotine replacement therapy includes nicotine gum, lozenges, inhalers, nasal sprays, and skin patches. Some may be available over-the-counter and others require a prescription.  Antidepressant medicine helps people abstain from smoking, but how this works is unknown. This medicine is available by prescription.  Nicotinic receptor partial agonist medicine simulates the effect of nicotine in your brain. This medicine is available by prescription. Ask your health care provider for advice about which medicines to use and how to use them based on your health history. Your health care provider will tell you what side effects to look out for if you choose to be on a medicine or therapy. Carefully read the information on the package. Do not use any other product containing nicotine while using a nicotine replacement product.  RELAPSE OR DIFFICULT SITUATIONS Most relapses occur within the first 3 months after quitting. Do not be discouraged if you start smoking again. Remember,  most people try several times before finally quitting. You may have symptoms of withdrawal because your body is used to nicotine. You may crave cigarettes, be irritable, feel very hungry, cough often, get headaches, or have difficulty concentrating. The withdrawal symptoms are only temporary. They are strongest when you first quit, but they will go away within 10-14 days. To reduce the chances of relapse, try to:  Avoid drinking alcohol. Drinking lowers your chances of successfully quitting.  Reduce the amount of caffeine you consume. Once you quit smoking, the amount of caffeine in your body increases and can give you symptoms, such as a rapid heartbeat, sweating, and anxiety.  Avoid smokers because they can make you want to smoke.  Do not let weight gain distract you. Many smokers will gain weight when they quit, usually less than 10  pounds. Eat a healthy diet and stay active. You can always lose the weight gained after you quit.  Find ways to improve your mood other than smoking. FOR MORE INFORMATION  www.smokefree.gov  Document Released: 04/03/2001 Document Revised: 08/24/2013 Document Reviewed: 07/19/2011 [facility] Patient Information 2015 [place], [place]. This information is not intended to replace advice given to you by your health care provider. Make sure you discuss any questions you have with your health care provider.

## 2014-11-08 NOTE — Progress Notes (Signed)
Pre visit review using our clinic review tool, if applicable. No additional management support is needed unless otherwise documented below in the visit note. 

## 2014-11-08 NOTE — Progress Notes (Signed)
Subjective:     Makayla Good is a 44 y.o. female and is here for a comprehensive physical exam. The patient reports problems - pt c/o both knees hurting R>L. Pt saw Dr Eddie Dibbles in the past and she would like to go back .   History   Social History  . Marital Status: Married    Spouse Name: N/A  . Number of Children: N/A  . Years of Education: 13   Occupational History  . 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 Smoking status: Current Some Day Smoker -- 0.50 packs/day for 16 years    Types: Cigarettes    Start date: 03/23/1993  . Smokeless tobacco: Never Used     Comment: smokeless cigarettes  . Alcohol Use: Yes  . Drug Use: No  . Sexual Activity:    Partners: Male     Comment: been with same person over 20 years   Other Topics Concern  . Not on file   Social History Narrative   Exercise--gym, every other day--- at least 3 days a week   Health Maintenance  Topic Date Due  . HIV Screening  07/26/2015 (Originally 11/07/1985)  . MAMMOGRAM  11/17/2014  . INFLUENZA VACCINE  11/22/2014  . PAP SMEAR  10/22/2015  . TETANUS/TDAP  09/03/2021    The following portions of the patient's history were reviewed and updated as appropriate:  She  has a past medical history of Hyperlipidemia; Hypertension; Asthma; Polycystic ovary; Eczema; Obesity; and H/O oophorectomy. She  does not have any pertinent problems on file. She  has past surgical history that includes Cesarean section (02/01/2005) and Tubal ligation (02/01/2005). Her family history includes Arthritis in her sister; Coronary artery disease in her father; Dementia in her maternal aunt and mother; Hypertension in her father, mother, sister, and sister; Kidney disease in an other family member; Mental illness in her mother; Schizophrenia in her mother. She  reports that she has been smoking Cigarettes.  She started smoking about 21 years ago. She has a 8 pack-year smoking history. She has never used  smokeless tobacco. She reports that she drinks alcohol. She reports that she does not use illicit drugs. She has a current medication list which includes the following prescription(s): albuterol, amlodipine, hydrochlorothiazide, metoprolol succinate, mometasone, multivitamin, and potassium chloride sa. Current Outpatient Prescriptions on File Prior to Visit  Medication Sig Dispense Refill  . albuterol (PROVENTIL HFA;VENTOLIN HFA) 108 (90 BASE) MCG/ACT inhaler Inhale 2 puffs into the lungs every 6 (six) hours as needed for wheezing or shortness of breath. 1 Inhaler 2  . amLODipine (NORVASC) 10 MG tablet take 1 tablet by mouth once daily 90 tablet 1  . metoprolol succinate (TOPROL-XL) 25 MG 24 hr tablet Take 1 tablet (25 mg total) by mouth daily. 90 tablet 1  . mometasone (NASONEX) 50 MCG/ACT nasal spray Place 2 sprays into the nose daily. 17 g 2  . Multiple Vitamin (MULTIVITAMIN) capsule Take 1 capsule by mouth daily.    . potassium chloride SA (K-DUR,KLOR-CON) 20 MEQ tablet Take 1 tablet (20 mEq total) by mouth daily. 90 tablet 1   No current facility-administered medications on file prior to visit.   She has No Known Allergies..  Review of Systems Review of Systems  Constitutional: Negative for activity change, appetite change and fatigue.  HENT: Negative for hearing loss, congestion, tinnitus and ear discharge.  dentist q57m Eyes: Negative for visual disturbance (see optho q1y -- vision corrected to 20/20  with glasses).  Respiratory: Negative for cough, chest tightness and shortness of breath.   Cardiovascular: Negative for chest pain, palpitations and leg swelling.  Gastrointestinal: Negative for abdominal pain, diarrhea, constipation and abdominal distention.  Genitourinary: Negative for urgency, frequency, decreased urine volume and difficulty urinating.  Musculoskeletal: Negative for back pain, arthralgias and gait problem.  Skin: Negative for color change, pallor and rash.   Neurological: Negative for dizziness, light-headedness, numbness and headaches.  Hematological: Negative for adenopathy. Does not bruise/bleed easily.  Psychiatric/Behavioral: Negative for suicidal ideas, confusion, sleep disturbance, self-injury, dysphoric mood, decreased concentration and agitation.       Objective:    BP 134/90 mmHg  Pulse 93  Temp(Src) 98 F (36.7 C) (Oral)  Ht 5\' 6"  (1.676 m)  Wt 278 lb 9.6 oz (126.372 kg)  BMI 44.99 kg/m2  SpO2 97%  LMP 11/04/2014 General appearance: alert, cooperative, appears stated age and no distress Head: Normocephalic, without obvious abnormality, atraumatic Eyes: conjunctivae/corneas clear. PERRL, EOM's intact. Fundi benign. Ears: normal TM's and external ear canals both ears Nose: Nares normal. Septum midline. Mucosa normal. No drainage or sinus tenderness. Throat: lips, mucosa, and tongue normal; teeth and gums normal Neck: no adenopathy, no carotid bruit, no JVD, supple, symmetrical, trachea midline and thyroid not enlarged, symmetric, no tenderness/mass/nodules Back: symmetric, no curvature. ROM normal. No CVA tenderness. Lungs: clear to auscultation bilaterally Breasts: normal appearance, no masses or tenderness Heart: regular rate and rhythm, S1, S2 normal, no murmur, click, rub or gallop Abdomen: soft, non-tender; bowel sounds normal; no masses,  no organomegaly Pelvic: deferred Extremities: extremities normal, atraumatic, no cyanosis or edema Pulses: 2+ and symmetric Skin: Skin color, texture, turgor normal. No rashes or lesions Lymph nodes: Cervical, supraclavicular, and axillary nodes normal. Neurologic: Alert and oriented X 3, normal strength and tone. Normal symmetric reflexes. Normal coordination and gait Psych- no depression, no anxiety      Assessment:    Healthy female exam.     Plan:     ghm utd Check labs See After Visit Summary for Counseling Recommendations    1. Essential hypertension  -  hydrochlorothiazide (HYDRODIURIL) 25 MG tablet; Take 1 tablet (25 mg total) by mouth daily.  Dispense: 90 tablet; Refill: 1 - CBC with Differential/Platelet  2. Vaginal bleeding  - Ambulatory referral to Gynecology  3. Preventative health care See AVS - Basic metabolic panel - CBC with Differential/Platelet - Hepatic function panel - Lipid panel - POCT urinalysis dipstick - TSH - HIV antibody  4. Arthralgia of both knees  - Ambulatory referral to Orthopedic Surgery

## 2014-11-18 ENCOUNTER — Ambulatory Visit
Admission: RE | Admit: 2014-11-18 | Discharge: 2014-11-18 | Disposition: A | Discharge status: Home / Self Care | Payer: BLUE CROSS/BLUE SHIELD | Source: Ambulatory Visit

## 2014-11-18 DIAGNOSIS — Z1231 Encounter for screening mammogram for malignant neoplasm of breast: Secondary | ICD-10-CM

## 2015-01-05 ENCOUNTER — Other Ambulatory Visit: Payer: Self-pay | Admitting: Family Medicine

## 2015-03-27 ENCOUNTER — Other Ambulatory Visit: Payer: Self-pay | Admitting: Family Medicine

## 2015-03-28 NOTE — Telephone Encounter (Signed)
Medication filled to pharmacy as requested.   

## 2015-06-28 ENCOUNTER — Encounter: Payer: Self-pay | Admitting: Family Medicine

## 2015-06-28 ENCOUNTER — Ambulatory Visit (INDEPENDENT_AMBULATORY_CARE_PROVIDER_SITE_OTHER): Payer: BLUE CROSS/BLUE SHIELD | Admitting: Family Medicine

## 2015-06-28 VITALS — BP 132/82 | HR 90 | Temp 98.5°F | Wt 276.0 lb

## 2015-06-28 DIAGNOSIS — N390 Urinary tract infection, site not specified: Secondary | ICD-10-CM

## 2015-06-28 DIAGNOSIS — M545 Low back pain: Secondary | ICD-10-CM

## 2015-06-28 DIAGNOSIS — R82998 Other abnormal findings in urine: Secondary | ICD-10-CM

## 2015-06-28 DIAGNOSIS — K219 Gastro-esophageal reflux disease without esophagitis: Secondary | ICD-10-CM

## 2015-06-28 LAB — POC URINALSYSI DIPSTICK (AUTOMATED)
Bilirubin, UA: NEGATIVE
Blood, UA: NEGATIVE
Glucose, UA: NEGATIVE
Ketones, UA: NEGATIVE
Nitrite, UA: NEGATIVE
Protein, UA: NEGATIVE
Spec Grav, UA: 1.02
Urobilinogen, UA: 2
pH, UA: 6

## 2015-06-28 MED ORDER — PANTOPRAZOLE SODIUM 40 MG PO TBEC
40.0000 mg | DELAYED_RELEASE_TABLET | Freq: Every day | ORAL | Status: DC
Start: 2015-06-28 — End: 2015-10-31

## 2015-06-28 NOTE — Patient Instructions (Signed)
Food Choices for Gastroesophageal Reflux Disease, Adult When you have gastroesophageal reflux disease (GERD), the foods you eat and your eating habits are very important. Choosing the right foods can help ease the discomfort of GERD. WHAT GENERAL GUIDELINES DO I NEED TO FOLLOW?  Choose fruits, vegetables, whole grains, low-fat dairy products, and low-fat meat, fish, and poultry.  Limit fats such as oils, salad dressings, butter, nuts, and avocado.  Keep a food diary to identify foods that cause symptoms.  Avoid foods that cause reflux. These may be different for different people.  Eat frequent small meals instead of three large meals each day.  Eat your meals slowly, in a relaxed setting.  Limit fried foods.  Cook foods using methods other than frying.  Avoid drinking alcohol.  Avoid drinking large amounts of liquids with your meals.  Avoid bending over or lying down until 2-3 hours after eating. WHAT FOODS ARE NOT RECOMMENDED? The following are some foods and drinks that may worsen your symptoms: Vegetables Tomatoes. Tomato juice. Tomato and spaghetti sauce. Chili peppers. Onion and garlic. Horseradish. Fruits Oranges, grapefruit, and lemon (fruit and juice). Meats High-fat meats, fish, and poultry. This includes hot dogs, ribs, ham, sausage, salami, and bacon. Dairy Whole milk and chocolate milk. Sour cream. Cream. Butter. Ice cream. Cream cheese.  Beverages Coffee and tea, with or without caffeine. Carbonated beverages or energy drinks. Condiments Hot sauce. Barbecue sauce.  Sweets/Desserts Chocolate and cocoa. Donuts. Peppermint and spearmint. Fats and Oils High-fat foods, including French fries and potato chips. Other Vinegar. Strong spices, such as black pepper, white pepper, red pepper, cayenne, curry powder, cloves, ginger, and chili powder. The items listed above may not be a complete list of foods and beverages to avoid. Contact your dietitian for more  information.   This information is not intended to replace advice given to you by your health care provider. Make sure you discuss any questions you have with your health care provider.   Document Released: 04/09/2005 Document Revised: 04/30/2014 Document Reviewed: 02/11/2013 [employer] Interactive Patient Education 2016 [employer]  

## 2015-06-28 NOTE — Progress Notes (Signed)
Pre visit review using our clinic review tool, if applicable. No additional management support is needed unless otherwise documented below in the visit note. 

## 2015-06-28 NOTE — Progress Notes (Signed)
Patient ID: Makayla Good, female    DOB: Jun 30, 1970  Age: 45 y.o. MRN: [id number]    Subjective:  Subjective HPI [name] presents for low back pain and gerd.   X few days.  No dysuria.  No otc meds.    Review of Systems  Constitutional: Negative for diaphoresis, appetite change, fatigue and unexpected weight change.  Eyes: Negative for pain, redness and visual disturbance.  Respiratory: Negative for cough, chest tightness, shortness of breath and wheezing.   Cardiovascular: Negative for chest pain, palpitations and leg swelling.  Gastrointestinal: Positive for nausea. Negative for vomiting.  Endocrine: Negative for cold intolerance, heat intolerance, polydipsia, polyphagia and polyuria.  Genitourinary: Negative for dysuria, frequency and difficulty urinating.  Musculoskeletal: Positive for back pain. Negative for myalgias, joint swelling, arthralgias, gait problem, neck pain and neck stiffness.  Neurological: Negative for dizziness, light-headedness, numbness and headaches.    History Past Medical History  Diagnosis Date  . Hyperlipidemia   . Hypertension   . Asthma   . Polycystic ovary     Left  . Eczema   . Obesity   . H/O oophorectomy     She has past surgical history that includes Cesarean section (02/01/2005) and Tubal ligation (02/01/2005).   Her family history includes Arthritis in her sister; Coronary artery disease in her father; Dementia in her maternal aunt and mother; Hypertension in her father, mother, sister, and sister; Mental illness in her mother; Schizophrenia in her mother.She reports that she has been smoking Cigarettes.  She started smoking about 22 years ago. She has a 8 pack-year smoking history. She has never used smokeless tobacco. She reports that she drinks alcohol. She reports that she does not use illicit drugs.  Current Outpatient Prescriptions on File Prior to Visit  Medication Sig Dispense Refill  . albuterol (PROVENTIL  HFA;VENTOLIN HFA) 108 (90 BASE) MCG/ACT inhaler Inhale 2 puffs into the lungs every 6 (six) hours as needed for wheezing or shortness of breath. 1 Inhaler 2  . amLODipine (NORVASC) 10 MG tablet take 1 tablet by mouth once daily 90 tablet 1  . hydrochlorothiazide (HYDRODIURIL) 25 MG tablet Take 1 tablet (25 mg total) by mouth daily. 90 tablet 1  . metoprolol succinate (TOPROL-XL) 25 MG 24 hr tablet take 1 tablet by mouth once daily 90 tablet 1  . mometasone (NASONEX) 50 MCG/ACT nasal spray Place 2 sprays into the nose daily. 17 g 2  . Multiple Vitamin (MULTIVITAMIN) capsule Take 1 capsule by mouth daily.    . potassium chloride SA (K-DUR,KLOR-CON) 20 MEQ tablet take 1 tablet by mouth once daily 90 tablet 1   No current facility-administered medications on file prior to visit.     Objective:  Objective Physical Exam  Constitutional: She is oriented to person, place, and time. She appears well-developed and well-nourished.  HENT:  Head: Normocephalic and atraumatic.  Eyes: Conjunctivae and EOM are normal.  Neck: Normal range of motion. Neck supple. No JVD present. Carotid bruit is not present. No thyromegaly present.  Cardiovascular: Normal rate, regular rhythm and normal heart sounds.   No murmur heard. Pulmonary/Chest: Effort normal and breath sounds normal. No respiratory distress. She has no wheezes. She has no rales. She exhibits no tenderness.  Musculoskeletal: Normal range of motion. She exhibits no edema or tenderness.  Neurological: She is alert and oriented to person, place, and time. She has normal reflexes.  Psychiatric: She has a normal mood and affect.  Nursing note and vitals reviewed.  BP 132/82 mmHg  Pulse 90  Temp(Src) 98.5 F (36.9 C) (Oral)  Wt 276 lb (125.193 kg)  SpO2 98% Wt Readings from Last 3 Encounters:  06/28/15 276 lb (125.193 kg)  11/08/14 278 lb 9.6 oz (126.372 kg)  10/02/14 278 lb 12.8 oz (126.463 kg)     Lab Results  Component Value Date   WBC  8.3 11/08/2014   HGB 13.5 11/08/2014   HCT 40.3 11/08/2014   PLT 272.0 11/08/2014   GLUCOSE 144* 06/29/2015   CHOL 163 11/08/2014   TRIG 131.0 11/08/2014   HDL 42.20 11/08/2014   LDLCALC 95 11/08/2014   ALT 19 06/29/2015   AST 16 06/29/2015   NA 137 06/29/2015   K 3.8 06/29/2015   CL 102 06/29/2015   CREATININE 0.67 06/29/2015   BUN 13 06/29/2015   CO2 26 06/29/2015   TSH 2.45 11/08/2014   MICROALBUR 0.5 08/05/2013    Mm Digital Screening Bilateral  11/19/2014  CLINICAL DATA:  Screening. EXAM: DIGITAL SCREENING BILATERAL MAMMOGRAM WITH CAD COMPARISON:  Previous exam(s). ACR Breast Density Category c: The breast tissue is heterogeneously dense, which may obscure small masses. FINDINGS: There are no findings suspicious for malignancy. Images were processed with CAD. IMPRESSION: No mammographic evidence of malignancy. A result letter of this screening mammogram will be mailed directly to the patient. RECOMMENDATION: Screening mammogram in one year. (Code:SM-B-01Y) BI-RADS CATEGORY  1: Negative. Electronically Signed   By: Claudie Revering M.D.   On: 11/19/2014 08:59     Assessment & Plan:  Plan I am having [name] start on pantoprazole. I am also having her maintain her albuterol, mometasone, multivitamin, hydrochlorothiazide, metoprolol succinate, potassium chloride SA, and amLODipine.  Meds ordered this encounter  Medications  . pantoprazole (PROTONIX) 40 MG tablet    Sig: Take 1 tablet (40 mg total) by mouth daily.    Dispense:  30 tablet    Refill:  3    Problem List Items Addressed This Visit    None    Visit Diagnoses    Low back pain without sciatica, unspecified back pain laterality    -  Primary    Relevant Orders    POCT Urinalysis Dipstick (Automated) (Completed)    Leukocytes in urine        Relevant Orders    Urine Culture    Gastroesophageal reflux disease, esophagitis presence not specified        Relevant Medications    pantoprazole (PROTONIX) 40 MG  tablet    Other Relevant Orders    Comp Met (CMET) (Completed)    H. pylori antibody, IgG (Completed)       Follow-up: Return if symptoms worsen or fail to improve.  Makayla Koyanagi, DO

## 2015-06-29 ENCOUNTER — Other Ambulatory Visit (INDEPENDENT_AMBULATORY_CARE_PROVIDER_SITE_OTHER): Payer: BLUE CROSS/BLUE SHIELD

## 2015-06-29 DIAGNOSIS — K219 Gastro-esophageal reflux disease without esophagitis: Secondary | ICD-10-CM

## 2015-06-29 LAB — COMPREHENSIVE METABOLIC PANEL
ALT: 19 U/L (ref 0–35)
AST: 16 U/L (ref 0–37)
Albumin: 4.1 g/dL (ref 3.5–5.2)
Alkaline Phosphatase: 61 U/L (ref 39–117)
BUN: 13 mg/dL (ref 6–23)
CO2: 26 mEq/L (ref 19–32)
Calcium: 9.7 mg/dL (ref 8.4–10.5)
Chloride: 102 mEq/L (ref 96–112)
Creatinine, Ser: 0.67 mg/dL (ref 0.40–1.20)
GFR: 122.61 mL/min (ref >60.00)
Glucose, Bld: 144 mg/dL — ABNORMAL HIGH (ref 70–99)
Potassium: 3.8 mEq/L (ref 3.5–5.1)
Sodium: 137 mEq/L (ref 135–145)
Total Bilirubin: 0.5 mg/dL (ref 0.2–1.2)
Total Protein: 7.3 g/dL (ref 6.0–8.3)

## 2015-06-29 LAB — H. PYLORI ANTIBODY, IGG: H Pylori IgG: NEGATIVE

## 2015-06-30 ENCOUNTER — Other Ambulatory Visit: Payer: Self-pay

## 2015-06-30 DIAGNOSIS — R7309 Other abnormal glucose: Secondary | ICD-10-CM

## 2015-06-30 LAB — URINE CULTURE: Colony Count: >=100000

## 2015-07-01 ENCOUNTER — Other Ambulatory Visit (INDEPENDENT_AMBULATORY_CARE_PROVIDER_SITE_OTHER): Payer: BLUE CROSS/BLUE SHIELD

## 2015-07-01 DIAGNOSIS — R7309 Other abnormal glucose: Secondary | ICD-10-CM | POA: Diagnosis not present

## 2015-07-01 LAB — COMPREHENSIVE METABOLIC PANEL
ALT: 18 U/L (ref 0–35)
AST: 15 U/L (ref 0–37)
Albumin: 4.1 g/dL (ref 3.5–5.2)
Alkaline Phosphatase: 56 U/L (ref 39–117)
BUN: 14 mg/dL (ref 6–23)
CO2: 29 mEq/L (ref 19–32)
Calcium: 9.5 mg/dL (ref 8.4–10.5)
Chloride: 104 mEq/L (ref 96–112)
Creatinine, Ser: 0.7 mg/dL (ref 0.40–1.20)
GFR: 116.56 mL/min (ref >60.00)
Glucose, Bld: 112 mg/dL — ABNORMAL HIGH (ref 70–99)
Potassium: 4.3 mEq/L (ref 3.5–5.1)
Sodium: 139 mEq/L (ref 135–145)
Total Bilirubin: 0.5 mg/dL (ref 0.2–1.2)
Total Protein: 7.3 g/dL (ref 6.0–8.3)

## 2015-07-01 LAB — HEMOGLOBIN A1C: Hgb A1c MFr Bld: 6.5 % (ref 4.6–6.5)

## 2015-07-20 ENCOUNTER — Other Ambulatory Visit: Payer: Self-pay | Admitting: Family Medicine

## 2015-09-15 ENCOUNTER — Other Ambulatory Visit: Payer: Self-pay | Admitting: Family Medicine

## 2015-09-20 ENCOUNTER — Other Ambulatory Visit: Payer: Self-pay

## 2015-09-20 DIAGNOSIS — Z1231 Encounter for screening mammogram for malignant neoplasm of breast: Secondary | ICD-10-CM

## 2015-10-31 ENCOUNTER — Other Ambulatory Visit: Payer: Self-pay | Admitting: Family Medicine

## 2015-10-31 DIAGNOSIS — K219 Gastro-esophageal reflux disease without esophagitis: Secondary | ICD-10-CM

## 2015-10-31 MED ORDER — PANTOPRAZOLE SODIUM 40 MG PO TBEC: 40.0000 mg | DELAYED_RELEASE_TABLET | Freq: Every day | ORAL | Status: DC

## 2015-10-31 NOTE — Telephone Encounter (Signed)
Per Insurance will need 90 day supply, also requesting 90 day supply of pantoprazole (PROTONIX) 40 MG tablet

## 2015-11-09 ENCOUNTER — Telehealth: Payer: Self-pay | Admitting: Family Medicine

## 2015-11-09 NOTE — Telephone Encounter (Signed)
Attempted to contact patient to reschedule appointment with Dr. Carollee Herter on August 31. Left message for patient to call the office to reschedule. Appointment cancelled.

## 2015-11-21 ENCOUNTER — Ambulatory Visit
Admission: RE | Admit: 2015-11-21 | Discharge: 2015-11-21 | Disposition: A | Discharge status: Home / Self Care | Payer: BLUE CROSS/BLUE SHIELD | Source: Ambulatory Visit

## 2015-11-21 DIAGNOSIS — Z1231 Encounter for screening mammogram for malignant neoplasm of breast: Secondary | ICD-10-CM | POA: Diagnosis not present

## 2015-12-22 ENCOUNTER — Encounter: Payer: BLUE CROSS/BLUE SHIELD | Admitting: Family Medicine

## 2016-01-02 ENCOUNTER — Other Ambulatory Visit: Payer: Self-pay | Admitting: Family Medicine

## 2016-01-23 ENCOUNTER — Other Ambulatory Visit: Payer: Self-pay

## 2016-01-23 ENCOUNTER — Ambulatory Visit (INDEPENDENT_AMBULATORY_CARE_PROVIDER_SITE_OTHER): Payer: BLUE CROSS/BLUE SHIELD | Admitting: Family Medicine

## 2016-01-23 ENCOUNTER — Encounter: Payer: Self-pay | Admitting: Family Medicine

## 2016-01-23 ENCOUNTER — Other Ambulatory Visit (HOSPITAL_COMMUNITY)
Admission: RE | Admit: 2016-01-23 | Discharge: 2016-01-23 | Disposition: A | Discharge status: Home / Self Care | Payer: BLUE CROSS/BLUE SHIELD | Source: Ambulatory Visit | Attending: Family Medicine | Admitting: Family Medicine

## 2016-01-23 VITALS — BP 135/89 | HR 97 | Temp 98.8°F | Ht 66.0 in | Wt 271.8 lb

## 2016-01-23 DIAGNOSIS — R0683 Snoring: Secondary | ICD-10-CM

## 2016-01-23 DIAGNOSIS — R200 Anesthesia of skin: Secondary | ICD-10-CM

## 2016-01-23 DIAGNOSIS — I10 Essential (primary) hypertension: Secondary | ICD-10-CM

## 2016-01-23 DIAGNOSIS — Z1151 Encounter for screening for human papillomavirus (HPV): Secondary | ICD-10-CM | POA: Diagnosis not present

## 2016-01-23 DIAGNOSIS — Z0000 Encounter for general adult medical examination without abnormal findings: Secondary | ICD-10-CM

## 2016-01-23 DIAGNOSIS — Z01419 Encounter for gynecological examination (general) (routine) without abnormal findings: Secondary | ICD-10-CM | POA: Insufficient documentation

## 2016-01-23 DIAGNOSIS — J208 Acute bronchitis due to other specified organisms: Secondary | ICD-10-CM

## 2016-01-23 DIAGNOSIS — K219 Gastro-esophageal reflux disease without esophagitis: Secondary | ICD-10-CM | POA: Diagnosis not present

## 2016-01-23 LAB — POCT URINALYSIS DIPSTICK
Bilirubin, UA: NEGATIVE
Blood, UA: NEGATIVE
Glucose, UA: NEGATIVE
Ketones, UA: NEGATIVE
Leukocytes, UA: NEGATIVE
Nitrite, UA: NEGATIVE
Protein, UA: NEGATIVE
Spec Grav, UA: 1.02
Urobilinogen, UA: NEGATIVE
pH, UA: 6

## 2016-01-23 MED ORDER — PANTOPRAZOLE SODIUM 40 MG PO TBEC: 40.0000 mg | DELAYED_RELEASE_TABLET | Freq: Every day | ORAL | 1 refills | Status: DC

## 2016-01-23 MED ORDER — HYDROCHLOROTHIAZIDE 25 MG PO TABS: 25.0000 mg | ORAL_TABLET | Freq: Every day | ORAL | 1 refills | Status: DC

## 2016-01-23 MED ORDER — AMLODIPINE BESYLATE 10 MG PO TABS: 10.0000 mg | ORAL_TABLET | Freq: Every day | ORAL | 1 refills | Status: DC

## 2016-01-23 MED ORDER — ALBUTEROL SULFATE HFA 108 (90 BASE) MCG/ACT IN AERS: 2.0000 | INHALATION_SPRAY | Freq: Four times a day (QID) | RESPIRATORY_TRACT | 2 refills | Status: DC | PRN

## 2016-01-23 MED ORDER — POTASSIUM CHLORIDE CRYS ER 20 MEQ PO TBCR: 20.0000 meq | EXTENDED_RELEASE_TABLET | Freq: Every day | ORAL | 1 refills | Status: DC

## 2016-01-23 MED ORDER — METOPROLOL SUCCINATE ER 25 MG PO TB24: 25.0000 mg | ORAL_TABLET | Freq: Every day | ORAL | 1 refills | Status: DC

## 2016-01-23 MED ORDER — LIRAGLUTIDE -WEIGHT MANAGEMENT 18 MG/3ML ~~LOC~~ SOPN: 3.0000 mg | PEN_INJECTOR | Freq: Every day | SUBCUTANEOUS | 3 refills | Status: DC

## 2016-01-23 NOTE — Patient Instructions (Signed)
Preventive Care for Adults, Female A healthy lifestyle and preventive care can promote health and wellness. Preventive health guidelines for women include the following key practices.  A routine yearly physical is a good way to check with your health care provider about your health and preventive screening. It is a chance to share any concerns and updates on your health and to receive a thorough exam.  Visit your dentist for a routine exam and preventive care every 6 months. Brush your teeth twice a day and floss once a day. Good oral hygiene prevents tooth decay and gum disease.  The frequency of eye exams is based on your age, health, family medical history, use of contact lenses, and other factors. Follow your health care provider's recommendations for frequency of eye exams.  Eat a healthy diet. Foods like vegetables, fruits, whole grains, low-fat dairy products, and lean protein foods contain the nutrients you need without too many calories. Decrease your intake of foods high in solid fats, added sugars, and salt. Eat the right amount of calories for you.Get information about a proper diet from your health care provider, if necessary.  Regular physical exercise is one of the most important things you can do for your health. Most adults should get at least 150 minutes of moderate-intensity exercise (any activity that increases your heart rate and causes you to sweat) each week. In addition, most adults need muscle-strengthening exercises on 2 or more days a week.  Maintain a healthy weight. The body mass index (BMI) is a screening tool to identify possible weight problems. It provides an estimate of body fat based on height and weight. Your health care provider can find your BMI and can help you achieve or maintain a healthy weight.For adults 20 years and older:  A BMI below 18.5 is considered underweight.  A BMI of 18.5 to 24.9 is normal.  A BMI of 25 to 29.9 is considered overweight.  A  BMI of 30 and above is considered obese.  Maintain normal blood lipids and cholesterol levels by exercising and minimizing your intake of saturated fat. Eat a balanced diet with plenty of fruit and vegetables. Blood tests for lipids and cholesterol should begin at age 45 and be repeated every 5 years. If your lipid or cholesterol levels are high, you are over 50, or you are at high risk for heart disease, you may need your cholesterol levels checked more frequently.Ongoing high lipid and cholesterol levels should be treated with medicines if diet and exercise are not working.  If you smoke, find out from your health care provider how to quit. If you do not use tobacco, do not start.  Lung cancer screening is recommended for adults aged 45-80 years who are at high risk for developing lung cancer because of a history of smoking. A yearly low-dose CT scan of the lungs is recommended for people who have at least a 30-pack-year history of smoking and are a current smoker or have quit within the past 15 years. A pack year of smoking is smoking an average of 1 pack of cigarettes a day for 1 year (for example: 1 pack a day for 30 years or 2 packs a day for 15 years). Yearly screening should continue until the smoker has stopped smoking for at least 15 years. Yearly screening should be stopped for people who develop a health problem that would prevent them from having lung cancer treatment.  If you are pregnant, do not drink alcohol. If you are  breastfeeding, be very cautious about drinking alcohol. If you are not pregnant and choose to drink alcohol, do not have more than 1 drink per day. One drink is considered to be 12 ounces (355 mL) of beer, 5 ounces (148 mL) of wine, or 1.5 ounces (44 mL) of liquor.  Avoid use of street drugs. Do not share needles with anyone. Ask for help if you need support or instructions about stopping the use of drugs.  High blood pressure causes heart disease and increases the risk  of stroke. Your blood pressure should be checked at least every 1 to 2 years. Ongoing high blood pressure should be treated with medicines if weight loss and exercise do not work.  If you are 55-79 years old, ask your health care provider if you should take aspirin to prevent strokes.  Diabetes screening is done by taking a blood sample to check your blood glucose level after you have not eaten for a certain period of time (fasting). If you are not overweight and you do not have risk factors for diabetes, you should be screened once every 3 years starting at age 45. If you are overweight or obese and you are 40-70 years of age, you should be screened for diabetes every year as part of your cardiovascular risk assessment.  Breast cancer screening is essential preventive care for women. You should practice "breast self-awareness." This means understanding the normal appearance and feel of your breasts and may include breast self-examination. Any changes detected, no matter how small, should be reported to a health care provider. Women in their 20s and 30s should have a clinical breast exam (CBE) by a health care provider as part of a regular health exam every 1 to 3 years. After age 40, women should have a CBE every year. Starting at age 40, women should consider having a mammogram (breast X-ray test) every year. Women who have a family history of breast cancer should talk to their health care provider about genetic screening. Women at a high risk of breast cancer should talk to their health care providers about having an MRI and a mammogram every year.  Breast cancer gene (BRCA)-related cancer risk assessment is recommended for women who have family members with BRCA-related cancers. BRCA-related cancers include breast, ovarian, tubal, and peritoneal cancers. Having family members with these cancers may be associated with an increased risk for harmful changes (mutations) in the breast cancer genes BRCA1 and  BRCA2. Results of the assessment will determine the need for genetic counseling and BRCA1 and BRCA2 testing.  Your health care provider may recommend that you be screened regularly for cancer of the pelvic organs (ovaries, uterus, and vagina). This screening involves a pelvic examination, including checking for microscopic changes to the surface of your cervix (Pap test). You may be encouraged to have this screening done every 3 years, beginning at age 21.  For women ages 30-65, health care providers may recommend pelvic exams and Pap testing every 3 years, or they may recommend the Pap and pelvic exam, combined with testing for human papilloma virus (HPV), every 5 years. Some types of HPV increase your risk of cervical cancer. Testing for HPV may also be done on women of any age with unclear Pap test results.  Other health care providers may not recommend any screening for nonpregnant women who are considered low risk for pelvic cancer and who do not have symptoms. Ask your health care provider if a screening pelvic exam is right for   you.  If you have had past treatment for cervical cancer or a condition that could lead to cancer, you need Pap tests and screening for cancer for at least 20 years after your treatment. If Pap tests have been discontinued, your risk factors (such as having a new sexual partner) need to be reassessed to determine if screening should resume. Some women have medical problems that increase the chance of getting cervical cancer. In these cases, your health care provider may recommend more frequent screening and Pap tests.  Colorectal cancer can be detected and often prevented. Most routine colorectal cancer screening begins at the age of 50 years and continues through age 75 years. However, your health care provider may recommend screening at an earlier age if you have risk factors for colon cancer. On a yearly basis, your health care provider may provide home test kits to check  for hidden blood in the stool. Use of a small camera at the end of a tube, to directly examine the colon (sigmoidoscopy or colonoscopy), can detect the earliest forms of colorectal cancer. Talk to your health care provider about this at age 50, when routine screening begins. Direct exam of the colon should be repeated every 5-10 years through age 75 years, unless early forms of precancerous polyps or small growths are found.  People who are at an increased risk for hepatitis B should be screened for this virus. You are considered at high risk for hepatitis B if:  You were born in a country where hepatitis B occurs often. Talk with your health care provider about which countries are considered high risk.  Your parents were born in a high-risk country and you have not received a shot to protect against hepatitis B (hepatitis B vaccine).  You have HIV or AIDS.  You use needles to inject street drugs.  You live with, or have sex with, someone who has hepatitis B.  You get hemodialysis treatment.  You take certain medicines for conditions like cancer, organ transplantation, and autoimmune conditions.  Hepatitis C blood testing is recommended for all people born from 1945 through 1965 and any individual with known risks for hepatitis C.  Practice safe sex. Use condoms and avoid high-risk sexual practices to reduce the spread of sexually transmitted infections (STIs). STIs include gonorrhea, chlamydia, syphilis, trichomonas, herpes, HPV, and human immunodeficiency virus (HIV). Herpes, HIV, and HPV are viral illnesses that have no cure. They can result in disability, cancer, and death.  You should be screened for sexually transmitted illnesses (STIs) including gonorrhea and chlamydia if:  You are sexually active and are younger than 24 years.  You are older than 24 years and your health care provider tells you that you are at risk for this type of infection.  Your sexual activity has changed  since you were last screened and you are at an increased risk for chlamydia or gonorrhea. Ask your health care provider if you are at risk.  If you are at risk of being infected with HIV, it is recommended that you take a prescription medicine daily to prevent HIV infection. This is called preexposure prophylaxis (PrEP). You are considered at risk if:  You are sexually active and do not regularly use condoms or know the HIV status of your partner(s).  You take drugs by injection.  You are sexually active with a partner who has HIV.  Talk with your health care provider about whether you are at high risk of being infected with HIV. If   you choose to begin PrEP, you should first be tested for HIV. You should then be tested every 3 months for as long as you are taking PrEP.  Osteoporosis is a disease in which the bones lose minerals and strength with aging. This can result in serious bone fractures or breaks. The risk of osteoporosis can be identified using a bone density scan. Women ages 67 years and over and women at risk for fractures or osteoporosis should discuss screening with their health care providers. Ask your health care provider whether you should take a calcium supplement or vitamin D to reduce the rate of osteoporosis.  Menopause can be associated with physical symptoms and risks. Hormone replacement therapy is available to decrease symptoms and risks. You should talk to your health care provider about whether hormone replacement therapy is right for you.  Use sunscreen. Apply sunscreen liberally and repeatedly throughout the day. You should seek shade when your shadow is shorter than you. Protect yourself by wearing long sleeves, pants, a wide-brimmed hat, and sunglasses year round, whenever you are outdoors.  Once a month, do a whole body skin exam, using a mirror to look at the skin on your back. Tell your health care provider of new moles, moles that have irregular borders, moles that  are larger than a pencil eraser, or moles that have changed in shape or color.  Stay current with required vaccines (immunizations).  Influenza vaccine. All adults should be immunized every year.  Tetanus, diphtheria, and acellular pertussis (Td, Tdap) vaccine. Pregnant women should receive 1 dose of Tdap vaccine during each pregnancy. The dose should be obtained regardless of the length of time since the last dose. Immunization is preferred during the 27th-36th week of gestation. An adult who has not previously received Tdap or who does not know her vaccine status should receive 1 dose of Tdap. This initial dose should be followed by tetanus and diphtheria toxoids (Td) booster doses every 10 years. Adults with an unknown or incomplete history of completing a 3-dose immunization series with Td-containing vaccines should begin or complete a primary immunization series including a Tdap dose. Adults should receive a Td booster every 10 years.  Varicella vaccine. An adult without evidence of immunity to varicella should receive 2 doses or a second dose if she has previously received 1 dose. Pregnant females who do not have evidence of immunity should receive the first dose after pregnancy. This first dose should be obtained before leaving the health care facility. The second dose should be obtained 4-8 weeks after the first dose.  Human papillomavirus (HPV) vaccine. Females aged 13-26 years who have not received the vaccine previously should obtain the 3-dose series. The vaccine is not recommended for use in pregnant females. However, pregnancy testing is not needed before receiving a dose. If a female is found to be pregnant after receiving a dose, no treatment is needed. In that case, the remaining doses should be delayed until after the pregnancy. Immunization is recommended for any person with an immunocompromised condition through the age of 61 years if she did not get any or all doses earlier. During the  3-dose series, the second dose should be obtained 4-8 weeks after the first dose. The third dose should be obtained 24 weeks after the first dose and 16 weeks after the second dose.  Zoster vaccine. One dose is recommended for adults aged 30 years or older unless certain conditions are present.  Measles, mumps, and rubella (MMR) vaccine. Adults born  before 1957 generally are considered immune to measles and mumps. Adults born in 1957 or later should have 1 or more doses of MMR vaccine unless there is a contraindication to the vaccine or there is laboratory evidence of immunity to each of the three diseases. A routine second dose of MMR vaccine should be obtained at least 28 days after the first dose for students attending postsecondary schools, health care workers, or international travelers. People who received inactivated measles vaccine or an unknown type of measles vaccine during 1963-1967 should receive 2 doses of MMR vaccine. People who received inactivated mumps vaccine or an unknown type of mumps vaccine before 1979 and are at high risk for mumps infection should consider immunization with 2 doses of MMR vaccine. For females of childbearing age, rubella immunity should be determined. If there is no evidence of immunity, females who are not pregnant should be vaccinated. If there is no evidence of immunity, females who are pregnant should delay immunization until after pregnancy. Unvaccinated health care workers born before 1957 who lack laboratory evidence of measles, mumps, or rubella immunity or laboratory confirmation of disease should consider measles and mumps immunization with 2 doses of MMR vaccine or rubella immunization with 1 dose of MMR vaccine.  Pneumococcal 13-valent conjugate (PCV13) vaccine. When indicated, a person who is uncertain of his immunization history and has no record of immunization should receive the PCV13 vaccine. All adults 65 years of age and older should receive this  vaccine. An adult aged 19 years or older who has certain medical conditions and has not been previously immunized should receive 1 dose of PCV13 vaccine. This PCV13 should be followed with a dose of pneumococcal polysaccharide (PPSV23) vaccine. Adults who are at high risk for pneumococcal disease should obtain the PPSV23 vaccine at least 8 weeks after the dose of PCV13 vaccine. Adults older than 45 years of age who have normal immune system function should obtain the PPSV23 vaccine dose at least 1 year after the dose of PCV13 vaccine.  Pneumococcal polysaccharide (PPSV23) vaccine. When PCV13 is also indicated, PCV13 should be obtained first. All adults aged 65 years and older should be immunized. An adult younger than age 65 years who has certain medical conditions should be immunized. Any person who resides in a nursing home or long-term care facility should be immunized. An adult smoker should be immunized. People with an immunocompromised condition and certain other conditions should receive both PCV13 and PPSV23 vaccines. People with human immunodeficiency virus (HIV) infection should be immunized as soon as possible after diagnosis. Immunization during chemotherapy or radiation therapy should be avoided. Routine use of PPSV23 vaccine is not recommended for American Indians, [place] Natives, or people younger than 65 years unless there are medical conditions that require PPSV23 vaccine. When indicated, people who have unknown immunization and have no record of immunization should receive PPSV23 vaccine. One-time revaccination 5 years after the first dose of PPSV23 is recommended for people aged 19-64 years who have chronic kidney failure, nephrotic syndrome, asplenia, or immunocompromised conditions. People who received 1-2 doses of PPSV23 before age 65 years should receive another dose of PPSV23 vaccine at age 65 years or later if at least 5 years have passed since the previous dose. Doses of PPSV23 are not  needed for people immunized with PPSV23 at or after age 65 years.  Meningococcal vaccine. Adults with asplenia or persistent complement component deficiencies should receive 2 doses of quadrivalent meningococcal conjugate (MenACWY-D) vaccine. The doses should be obtained   at least 2 months apart. Microbiologists working with certain meningococcal bacteria, [place] recruits, people at risk during an outbreak, and people who travel to or live in countries with a high rate of meningitis should be immunized. A first-year college student up through age 34 years who is living in a residence hall should receive a dose if she did not receive a dose on or after her 16th birthday. Adults who have certain high-risk conditions should receive one or more doses of vaccine.  Hepatitis A vaccine. Adults who wish to be protected from this disease, have certain high-risk conditions, work with hepatitis A-infected animals, work in hepatitis A research labs, or travel to or work in countries with a high rate of hepatitis A should be immunized. Adults who were previously unvaccinated and who anticipate close contact with an international adoptee during the first 60 days after arrival in the [place] from a country with a high rate of hepatitis A should be immunized.  Hepatitis B vaccine. Adults who wish to be protected from this disease, have certain high-risk conditions, may be exposed to blood or other infectious body fluids, are household contacts or sex partners of hepatitis B positive people, are clients or workers in certain care facilities, or travel to or work in countries with a high rate of hepatitis B should be immunized.  Haemophilus influenzae type b (Hib) vaccine. A previously unvaccinated person with asplenia or sickle cell disease or having a scheduled splenectomy should receive 1 dose of Hib vaccine. Regardless of previous immunization, a recipient of a hematopoietic stem cell transplant should receive a  3-dose series 6-12 months after her successful transplant. Hib vaccine is not recommended for adults with HIV infection. Preventive Services / Frequency Ages 35 to 4 years  Blood pressure check.** / Every 3-5 years.  Lipid and cholesterol check.** / Every 5 years beginning at age 60.  Clinical breast exam.** / Every 3 years for women in their 71s and 10s.  BRCA-related cancer risk assessment.** / For women who have family members with a BRCA-related cancer (breast, ovarian, tubal, or peritoneal cancers).  Pap test.** / Every 2 years from ages 76 through 26. Every 3 years starting at age 61 through age 76 or 93 with a history of 3 consecutive normal Pap tests.  HPV screening.** / Every 3 years from ages 37 through ages 60 to 51 with a history of 3 consecutive normal Pap tests.  Hepatitis C blood test.** / For any individual with known risks for hepatitis C.  Skin self-exam. / Monthly.  Influenza vaccine. / Every year.  Tetanus, diphtheria, and acellular pertussis (Tdap, Td) vaccine.** / Consult your health care provider. Pregnant women should receive 1 dose of Tdap vaccine during each pregnancy. 1 dose of Td every 10 years.  Varicella vaccine.** / Consult your health care provider. Pregnant females who do not have evidence of immunity should receive the first dose after pregnancy.  HPV vaccine. / 3 doses over 6 months, if 93 and younger. The vaccine is not recommended for use in pregnant females. However, pregnancy testing is not needed before receiving a dose.  Measles, mumps, rubella (MMR) vaccine.** / You need at least 1 dose of MMR if you were born in 1957 or later. You may also need a 2nd dose. For females of childbearing age, rubella immunity should be determined. If there is no evidence of immunity, females who are not pregnant should be vaccinated. If there is no evidence of immunity, females who are  pregnant should delay immunization until after pregnancy.  Pneumococcal  13-valent conjugate (PCV13) vaccine.** / Consult your health care provider.  Pneumococcal polysaccharide (PPSV23) vaccine.** / 1 to 2 doses if you smoke cigarettes or if you have certain conditions.  Meningococcal vaccine.** / 1 dose if you are age 68 to 8 years and a Market researcher living in a residence hall, or have one of several medical conditions, you need to get vaccinated against meningococcal disease. You may also need additional booster doses.  Hepatitis A vaccine.** / Consult your health care provider.  Hepatitis B vaccine.** / Consult your health care provider.  Haemophilus influenzae type b (Hib) vaccine.** / Consult your health care provider. Ages 7 to 53 years  Blood pressure check.** / Every year.  Lipid and cholesterol check.** / Every 5 years beginning at age 25 years.  Lung cancer screening. / Every year if you are aged 11-80 years and have a 30-pack-year history of smoking and currently smoke or have quit within the past 15 years. Yearly screening is stopped once you have quit smoking for at least 15 years or develop a health problem that would prevent you from having lung cancer treatment.  Clinical breast exam.** / Every year after age 48 years.  BRCA-related cancer risk assessment.** / For women who have family members with a BRCA-related cancer (breast, ovarian, tubal, or peritoneal cancers).  Mammogram.** / Every year beginning at age 41 years and continuing for as long as you are in good health. Consult with your health care provider.  Pap test.** / Every 3 years starting at age 65 years through age 37 or 70 years with a history of 3 consecutive normal Pap tests.  HPV screening.** / Every 3 years from ages 72 years through ages 60 to 40 years with a history of 3 consecutive normal Pap tests.  Fecal occult blood test (FOBT) of stool. / Every year beginning at age 21 years and continuing until age 5 years. You may not need to do this test if you get  a colonoscopy every 10 years.  Flexible sigmoidoscopy or colonoscopy.** / Every 5 years for a flexible sigmoidoscopy or every 10 years for a colonoscopy beginning at age 35 years and continuing until age 48 years.  Hepatitis C blood test.** / For all people born from [birth year] through 1965 and any individual with known risks for hepatitis C.  Skin self-exam. / Monthly.  Influenza vaccine. / Every year.  Tetanus, diphtheria, and acellular pertussis (Tdap/Td) vaccine.** / Consult your health care provider. Pregnant women should receive 1 dose of Tdap vaccine during each pregnancy. 1 dose of Td every 10 years.  Varicella vaccine.** / Consult your health care provider. Pregnant females who do not have evidence of immunity should receive the first dose after pregnancy.  Zoster vaccine.** / 1 dose for adults aged 30 years or older.  Measles, mumps, rubella (MMR) vaccine.** / You need at least 1 dose of MMR if you were born in 1957 or later. You may also need a second dose. For females of childbearing age, rubella immunity should be determined. If there is no evidence of immunity, females who are not pregnant should be vaccinated. If there is no evidence of immunity, females who are pregnant should delay immunization until after pregnancy.  Pneumococcal 13-valent conjugate (PCV13) vaccine.** / Consult your health care provider.  Pneumococcal polysaccharide (PPSV23) vaccine.** / 1 to 2 doses if you smoke cigarettes or if you have certain conditions.  Meningococcal vaccine.** /  Consult your health care provider.  Hepatitis A vaccine.** / Consult your health care provider.  Hepatitis B vaccine.** / Consult your health care provider.  Haemophilus influenzae type b (Hib) vaccine.** / Consult your health care provider. Ages 64 years and over  Blood pressure check.** / Every year.  Lipid and cholesterol check.** / Every 5 years beginning at age 23 years.  Lung cancer screening. / Every year if you  are aged 16-80 years and have a 30-pack-year history of smoking and currently smoke or have quit within the past 15 years. Yearly screening is stopped once you have quit smoking for at least 15 years or develop a health problem that would prevent you from having lung cancer treatment.  Clinical breast exam.** / Every year after age 74 years.  BRCA-related cancer risk assessment.** / For women who have family members with a BRCA-related cancer (breast, ovarian, tubal, or peritoneal cancers).  Mammogram.** / Every year beginning at age 44 years and continuing for as long as you are in good health. Consult with your health care provider.  Pap test.** / Every 3 years starting at age 58 years through age 22 or 39 years with 3 consecutive normal Pap tests. Testing can be stopped between 65 and 70 years with 3 consecutive normal Pap tests and no abnormal Pap or HPV tests in the past 10 years.  HPV screening.** / Every 3 years from ages 64 years through ages 70 or 61 years with a history of 3 consecutive normal Pap tests. Testing can be stopped between 65 and 70 years with 3 consecutive normal Pap tests and no abnormal Pap or HPV tests in the past 10 years.  Fecal occult blood test (FOBT) of stool. / Every year beginning at age 40 years and continuing until age 27 years. You may not need to do this test if you get a colonoscopy every 10 years.  Flexible sigmoidoscopy or colonoscopy.** / Every 5 years for a flexible sigmoidoscopy or every 10 years for a colonoscopy beginning at age 7 years and continuing until age 32 years.  Hepatitis C blood test.** / For all people born from [birth year] through 1965 and any individual with known risks for hepatitis C.  Osteoporosis screening.** / A one-time screening for women ages 30 years and over and women at risk for fractures or osteoporosis.  Skin self-exam. / Monthly.  Influenza vaccine. / Every year.  Tetanus, diphtheria, and acellular pertussis (Tdap/Td)  vaccine.** / 1 dose of Td every 10 years.  Varicella vaccine.** / Consult your health care provider.  Zoster vaccine.** / 1 dose for adults aged 35 years or older.  Pneumococcal 13-valent conjugate (PCV13) vaccine.** / Consult your health care provider.  Pneumococcal polysaccharide (PPSV23) vaccine.** / 1 dose for all adults aged 46 years and older.  Meningococcal vaccine.** / Consult your health care provider.  Hepatitis A vaccine.** / Consult your health care provider.  Hepatitis B vaccine.** / Consult your health care provider.  Haemophilus influenzae type b (Hib) vaccine.** / Consult your health care provider. ** Family history and personal history of risk and conditions may change your health care provider's recommendations.   This information is not intended to replace advice given to you by your health care provider. Make sure you discuss any questions you have with your health care provider.   Document Released: 06/05/2001 Document Revised: 04/30/2014 Document Reviewed: 09/04/2010 Elsevier Interactive Patient Education [employer].

## 2016-01-23 NOTE — Progress Notes (Unsigned)
Pre visit review using our clinic review tool, if applicable. No additional management support is needed unless otherwise documented below in the visit note. 

## 2016-01-23 NOTE — Progress Notes (Signed)
Pre visit review using our clinic review tool, if applicable. No additional management support is needed unless otherwise documented below in the visit note. 

## 2016-01-23 NOTE — Progress Notes (Signed)
Subjective:     Makayla Good is a 45 y.o. female and is here for a comprehensive physical exam. The patient reports problems with sleep and snoring and numbness and tingling in her feel -- she is requesting a referral.   She also is requesting help with losing weight.   She is exercising and watching her diet.  Social History   Social History  . Marital status: Married    Spouse name: N/A  . Number of children: N/A  . Years of education: 74   Occupational History  . 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 Smoking status: Current Some Day Smoker    Packs/day: 0.50    Years: 16.00    Types: Cigarettes    Start date: 03/23/1993  . Smokeless tobacco: Never Used     Comment: smokeless cigarettes  . Alcohol use Yes  . Drug use: No  . Sexual activity: Yes    Partners: Male     Comment: been with same person over 20 years   Other Topics Concern  . Not on file   Social History Narrative   Exercise--gym, every other day--- at least 3 days a week   Health Maintenance  Topic Date Due  . MAMMOGRAM  11/20/2016  . PAP SMEAR  01/23/2019  . TETANUS/TDAP  09/03/2021  . INFLUENZA VACCINE  Addressed  . HIV Screening  Completed    The following portions of the patient's history were reviewed and updated as appropriate:  She  has a past medical history of Asthma; Eczema; H/O oophorectomy; Hyperlipidemia; Hypertension; Obesity; and Polycystic ovary. She  does not have any pertinent problems on file. She  has a past surgical history that includes Cesarean section (02/01/2005) and Tubal ligation (02/01/2005). Her family history includes Arthritis in her sister; Coronary artery disease in her father; Dementia in her maternal aunt and mother; Hypertension in her father, mother, sister, and sister; Mental illness in her mother; Schizophrenia in her mother. She  reports that she has been smoking Cigarettes.  She started smoking about 22 years ago. She has a 8.00  pack-year smoking history. She has never used smokeless tobacco. She reports that she drinks alcohol. She reports that she does not use drugs. She has a current medication list which includes the following prescription(s): albuterol, amlodipine, hydrochlorothiazide, metoprolol succinate, mometasone, multivitamin, pantoprazole, potassium chloride sa, and liraglutide -weight management. Current Outpatient Prescriptions on File Prior to Visit  Medication Sig Dispense Refill  . mometasone (NASONEX) 50 MCG/ACT nasal spray Place 2 sprays into the nose daily. 17 g 2  . Multiple Vitamin (MULTIVITAMIN) capsule Take 1 capsule by mouth daily.     No current facility-administered medications on file prior to visit.    She has No Known Allergies..  Review of Systems Review of Systems  Constitutional: Negative for activity change, appetite change and fatigue.  HENT: Negative for hearing loss, congestion, tinnitus and ear discharge.  dentist q57m Eyes: Negative for visual disturbance (see optho q1y -- vision corrected to 20/20 with glasses).  Respiratory: Negative for cough, chest tightness and shortness of breath.   Cardiovascular: Negative for chest pain, palpitations and leg swelling.  Gastrointestinal: Negative for abdominal pain, diarrhea, constipation and abdominal distention.  Genitourinary: Negative for urgency, frequency, decreased urine volume and difficulty urinating.  Musculoskeletal: Negative for back pain, arthralgias and gait problem.  Skin: Negative for color change, pallor and rash.  Neurological: Negative for dizziness, light-headedness,  and headaches. + pins  and needles in both feet Hematological: Negative for adenopathy. Does not bruise/bleed easily.  Psychiatric/Behavioral: Negative for suicidal ideas, confusion,  self-injury, dysphoric mood, decreased concentration and agitation.  + insomnia      Objective:    BP 135/89 (BP Location: Left Arm, Patient Position: Sitting, Cuff  Size: Large)   Pulse 97   Temp 98.8 F (37.1 C) (Oral)   Ht 5\' 6"  (1.676 m)   Wt 271 lb 12.8 oz (123.3 kg)   LMP 01/13/2016   SpO2 97%   BMI 43.87 kg/m  General appearance: alert, cooperative, appears stated age and no distress Head: Normocephalic, without obvious abnormality, atraumatic Eyes: conjunctivae/corneas clear. PERRL, EOM's intact. Fundi benign. Ears: normal TM's and external ear canals both ears Nose: Nares normal. Septum midline. Mucosa normal. No drainage or sinus tenderness. Throat: lips, mucosa, and tongue normal; teeth and gums normal Neck: no adenopathy, no carotid bruit, no JVD, supple, symmetrical, trachea midline and thyroid not enlarged, symmetric, no tenderness/mass/nodules Back: symmetric, no curvature. ROM normal. No CVA tenderness. Lungs: clear to auscultation bilaterally Breasts: normal appearance, no masses or tenderness Heart: regular rate and rhythm, S1, S2 normal, no murmur, click, rub or gallop Abdomen: soft, non-tender; bowel sounds normal; no masses,  no organomegaly Pelvic: cervix normal in appearance, external genitalia normal, no adnexal masses or tenderness, no cervical motion tenderness, rectovaginal septum normal, uterus normal size, shape, and consistency, vagina normal without discharge and pap done, rectal heme neg brown stool Extremities: extremities normal, atraumatic, no cyanosis or edema Pulses: 2+ and symmetric Skin: Skin color, texture, turgor normal. No rashes or lesions Lymph nodes: Cervical, supraclavicular, and axillary nodes normal. Neurologic: Alert and oriented X 3, normal strength and tone. Normal symmetric reflexes. Normal coordination and gait Psych- no depression, no anxiety     Assessment:    Healthy female exam.      Plan:     ghm utd Check labs See After Visit Summary for Counseling Recommendations    1. Snoring  - Ambulatory referral to Neurology  2. Numbness in feet  - Ambulatory referral to Neurology  3.  Gastroesophageal reflux disease, esophagitis presence not specified  - pantoprazole (PROTONIX) 40 MG tablet; Take 1 tablet (40 mg total) by mouth daily.  Dispense: 90 tablet; Refill: 1  4. Acute bronchitis due to other specified organisms  - albuterol (PROVENTIL HFA;VENTOLIN HFA) 108 (90 Base) MCG/ACT inhaler; Inhale 2 puffs into the lungs every 6 (six) hours as needed for wheezing or shortness of breath.  Dispense: 1 Inhaler; Refill: 2  5. Essential hypertension  - potassium chloride SA (K-DUR,KLOR-CON) 20 MEQ tablet; Take 1 tablet (20 mEq total) by mouth daily. Repeat labs are due now  Dispense: 90 tablet; Refill: 1 - metoprolol succinate (TOPROL-XL) 25 MG 24 hr tablet; Take 1 tablet (25 mg total) by mouth daily.  Dispense: 90 tablet; Refill: 1 - hydrochlorothiazide (HYDRODIURIL) 25 MG tablet; Take 1 tablet (25 mg total) by mouth daily.  Dispense: 90 tablet; Refill: 1 - amLODipine (NORVASC) 10 MG tablet; Take 1 tablet (10 mg total) by mouth daily.  Dispense: 90 tablet; Refill: 1 - Comprehensive metabolic panel - Lipid panel - CBC with Differential/Platelet - POCT urinalysis dipstick - TSH - Microalbumin / creatinine urine ratio  6. Preventative health care See above - Comprehensive metabolic panel - Lipid panel - CBC with Differential/Platelet - POCT urinalysis dipstick - TSH - Microalbumin / creatinine urine ratio - Cytology - PAP  7. Severe obesity (BMI >= 40) (HCC) Diet  and exercise  - Liraglutide -Weight Management (SAXENDA) 18 MG/3ML SOPN; Inject 3 mg into the skin daily.  Dispense: 5 pen; Refill: 3

## 2016-01-24 LAB — LIPID PANEL
Cholesterol: 161 mg/dL (ref 0–200)
HDL: 38.6 mg/dL — ABNORMAL LOW (ref >39.00)
LDL Cholesterol: 94 mg/dL (ref 0–99)
NonHDL: 122.22
Total CHOL/HDL Ratio: 4
Triglycerides: 142 mg/dL (ref 0.0–149.0)
VLDL: 28.4 mg/dL (ref 0.0–40.0)

## 2016-01-24 LAB — CBC WITH DIFFERENTIAL/PLATELET
Basophils Absolute: 0.1 10*3/uL (ref 0.0–0.1)
Basophils Relative: 0.9 % (ref 0.0–3.0)
Eosinophils Absolute: 0.2 10*3/uL (ref 0.0–0.7)
Eosinophils Relative: 2 % (ref 0.0–5.0)
HCT: 40.5 % (ref 36.0–46.0)
Hemoglobin: 13.6 g/dL (ref 12.0–15.0)
Lymphocytes Relative: 43.1 % (ref 12.0–46.0)
Lymphs Abs: 3.9 10*3/uL (ref 0.7–4.0)
MCHC: 33.6 g/dL (ref 30.0–36.0)
MCV: 93.2 fl (ref 78.0–100.0)
Monocytes Absolute: 0.5 10*3/uL (ref 0.1–1.0)
Monocytes Relative: 5.1 % (ref 3.0–12.0)
Neutro Abs: 4.4 10*3/uL (ref 1.4–7.7)
Neutrophils Relative %: 48.9 % (ref 43.0–77.0)
Platelets: 252 10*3/uL (ref 150.0–400.0)
RBC: 4.35 Mil/uL (ref 3.87–5.11)
RDW: 13.8 % (ref 11.5–15.5)
WBC: 9 10*3/uL (ref 4.0–10.5)

## 2016-01-24 LAB — MICROALBUMIN / CREATININE URINE RATIO
Creatinine,U: 55.9 mg/dL
Microalb Creat Ratio: 1.3 mg/g (ref 0.0–30.0)
Microalb, Ur: <0.7 mg/dL (ref 0.0–1.9)

## 2016-01-24 LAB — TSH: TSH: 2.47 u[IU]/mL (ref 0.35–4.50)

## 2016-01-24 LAB — COMPREHENSIVE METABOLIC PANEL
ALT: 19 U/L (ref 0–35)
AST: 17 U/L (ref 0–37)
Albumin: 4 g/dL (ref 3.5–5.2)
Alkaline Phosphatase: 66 U/L (ref 39–117)
BUN: 11 mg/dL (ref 6–23)
CO2: 29 mEq/L (ref 19–32)
Calcium: 9.5 mg/dL (ref 8.4–10.5)
Chloride: 100 mEq/L (ref 96–112)
Creatinine, Ser: 0.72 mg/dL (ref 0.40–1.20)
GFR: 112.54 mL/min (ref >60.00)
Glucose, Bld: 81 mg/dL (ref 70–99)
Potassium: 4 mEq/L (ref 3.5–5.1)
Sodium: 138 mEq/L (ref 135–145)
Total Bilirubin: 0.5 mg/dL (ref 0.2–1.2)
Total Protein: 7.6 g/dL (ref 6.0–8.3)

## 2016-01-25 LAB — CYTOLOGY - PAP

## 2016-02-07 ENCOUNTER — Ambulatory Visit (INDEPENDENT_AMBULATORY_CARE_PROVIDER_SITE_OTHER): Payer: BLUE CROSS/BLUE SHIELD | Admitting: Neurology

## 2016-02-07 ENCOUNTER — Encounter: Payer: Self-pay | Admitting: Neurology

## 2016-02-07 VITALS — BP 140/88 | HR 96 | Resp 20 | Ht 65.0 in | Wt 270.0 lb

## 2016-02-07 DIAGNOSIS — Z6841 Body Mass Index (BMI) 40.0 and over, adult: Secondary | ICD-10-CM | POA: Diagnosis not present

## 2016-02-07 DIAGNOSIS — R51 Headache: Secondary | ICD-10-CM

## 2016-02-07 DIAGNOSIS — R0683 Snoring: Secondary | ICD-10-CM

## 2016-02-07 DIAGNOSIS — G4733 Obstructive sleep apnea (adult) (pediatric): Secondary | ICD-10-CM | POA: Diagnosis not present

## 2016-02-07 DIAGNOSIS — J209 Acute bronchitis, unspecified: Secondary | ICD-10-CM | POA: Diagnosis not present

## 2016-02-07 DIAGNOSIS — J4521 Mild intermittent asthma with (acute) exacerbation: Secondary | ICD-10-CM | POA: Diagnosis not present

## 2016-02-07 DIAGNOSIS — R519 Headache, unspecified: Secondary | ICD-10-CM

## 2016-02-07 NOTE — Patient Instructions (Signed)
Please remember to try to maintain good sleep hygiene, which means: Keep a regular sleep and wake schedule, try not to exercise or have a meal within 2 hours of your bedtime, try to keep your bedroom conducive for sleep, that is, cool and dark, without light distractors such as an illuminated alarm clock, and refrain from watching TV right before sleep or in the middle of the night and do not keep the TV or radio on during the night. Also, try not to use or play on electronic devices at bedtime, such as your cell phone, tablet PC or laptop. If you like to read at bedtime on an electronic device, try to dim the background light as much as possible. Do not eat in the middle of the night.   We will request a sleep study.    We will look for leg twitching and snoring or sleep apnea.   For chronic insomnia, you are best followed by a psychiatrist and/or sleep psychologist.   We will call you with the sleep study results and make a follow up appointment if needed.   Esker Dever, MD

## 2016-02-07 NOTE — Progress Notes (Signed)
SLEEP MED[facility]INE CLINIC   Provider:  Larey Seat, M D  Referring Provider: Carollee Herter, Alferd Apa, * Primary Care Physician:  [name], DO  Chief Complaint  Patient presents with  . New Patient (Initial Visit)    some snoring, never had a sleep study    HPI:  Makayla Good is a 45 y.o. female , seen here as a referral from Dr. Carollee Herter for a sleep evaluation,  Chief complaint according to patient : " I just don't rest at night "  My feet hurt, I have electric shock and stinging sensation in my feet at night "   Mrs. Graper has a past medical history of gastroesophageal reflux disease, acute bronchitis seasonally, underlying asthma, hypertension, and morbid obesity with BMI over 40. Her weight management drug Saxenda , had been prescribed by Dr.Lowne and was not permitted by insurance. She has several risk factors besides being witnessed to have snored and sometimes holding her breath, and she also smokes, socially drinks alcohol, but she does not have any history of drug use, and her caffeine use is moderate. She drinks coffee 2 a day, no sodas, no iced teas.    Sleep habits are as follows: She usually goes to bed around 11 PM and within 30 minutes finds herself asleep. Her husband sleeps with the TV on to the bedroom as not quiet nor dark, but is cool. There is a ceiling fan for white noise. She usually has one sometimes 2 bathroom breaks at night. She is a habitual mouth breather. She falls asleep on her side. She's using 2 pillows, no wedge and no raised head of bed. She feels that she dreams a lot, sometimes weird or vivid dreams but not frequently. Her dreams are not nightmarish. Her husband has not described her as a restless sleeper, has noted snoring and irregular breathing. He is trying frequently at night to get her back sleep on her side after she inadvertently resumed to a supine position. This is when she snores the loudest and has a more sleep  disordered breathing. He is a night shift worker 4 nights a week.  She does not like the TV in the background and it is likely contributing to her fragmented sleep. At this time of the year she has a lot of coughing which also affects her sleep. Last night she barely got sleep. She states that her feet tend to become puffy and stenting, but she also has puffy hands. This edema is unexplained he has reduced her salt intake is very aware of the risks for fluid retention, she has not been put on medications that could promote fluid retention during she takes a diuretic in AM . She rises at 5.35 AM , her youngest child goes onto the bus by 6.30 AM.  Sleep medical history and family sleep history:  She's not sure when her sleep problems originally begun, she does not think that she had sleep difficulties during her high school years, but she has always been a light sleeper and easily aroused. In addition her husband's night shift schedule interferes with her own sleep needs. She does not have known members of her family with obstructive sleep apnea. Obesity is running in her family.    Social history: Married with children, right handed , works for 18 years at [employer].   Review of Systems: Out of a complete 14 system review, the patient complains of only the following symptoms, and all  other reviewed systems are negative.  Epworth score 7 , Fatigue severity score 30  , depression score n/a , no time for naps. Snoring, coughing, reflux.    Social History   Social History  . Marital status: Married    Spouse name: N/A  . Number of children: N/A  . Years of education: 69   Occupational History  . 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 Smoking status: Current Some Day Smoker    Packs/day: 0.50    Years: 16.00    Types: Cigarettes    Start date: 03/23/1993  . Smokeless tobacco: Never Used     Comment: smokeless cigarettes  . Alcohol use Yes  . Drug use: No  .  Sexual activity: Yes    Partners: Male     Comment: been with same person over 20 years   Other Topics Concern  . Not on file   Social History Narrative   Exercise--gym, every other day--- at least 3 days a week    Family History  Problem Relation Age of Onset  . Hypertension Mother   . Schizophrenia Mother   . Dementia Mother   . Mental illness Mother     schizophrenia, dementia  . Hypertension Father   . Coronary artery disease Father     Stent  . Hypertension Sister   . Kidney disease    . Hypertension Sister   . Arthritis Sister     rheumatoid  . Dementia Maternal Aunt     Past Medical History:  Diagnosis Date  . Asthma   . Eczema   . H/O oophorectomy   . Hyperlipidemia   . Hypertension   . Obesity   . Polycystic ovary    Left    Past Surgical History:  Procedure Laterality Date  . CESAREAN SECTION  02/01/2005  . TUBAL LIGATION  02/01/2005    Current Outpatient Prescriptions  Medication Sig Dispense Refill  . albuterol (PROVENTIL HFA;VENTOLIN HFA) 108 (90 Base) MCG/ACT inhaler Inhale 2 puffs into the lungs every 6 (six) hours as needed for wheezing or shortness of breath. 1 Inhaler 2  . amLODipine (NORVASC) 10 MG tablet Take 1 tablet (10 mg total) by mouth daily. 90 tablet 1  . hydrochlorothiazide (HYDRODIURIL) 25 MG tablet Take 1 tablet (25 mg total) by mouth daily. 90 tablet 1  . metoprolol succinate (TOPROL-XL) 25 MG 24 hr tablet Take 1 tablet (25 mg total) by mouth daily. 90 tablet 1  . Multiple Vitamin (MULTIVITAMIN) capsule Take 1 capsule by mouth daily.    . pantoprazole (PROTONIX) 40 MG tablet Take 1 tablet (40 mg total) by mouth daily. 90 tablet 1  . potassium chloride SA (K-DUR,KLOR-CON) 20 MEQ tablet Take 1 tablet (20 mEq total) by mouth daily. Repeat labs are due now 90 tablet 1  . Liraglutide -Weight Management (SAXENDA) 18 MG/3ML SOPN Inject 3 mg into the skin daily. (Patient not taking: Reported on 02/07/2016) 5 pen 3  . mometasone (NASONEX)  50 MCG/ACT nasal spray Place 2 sprays into the nose daily. (Patient not taking: Reported on 02/07/2016) 17 g 2   No current facility-administered medications for this visit.     Allergies as of 02/07/2016  . (No Known Allergies)    Vitals: BP 140/88   Pulse 96   Resp 20   Ht 5\' 5"  (1.651 m)   Wt 270 lb (122.5 kg)   LMP 01/13/2016   BMI 44.93 kg/m  Last Weight:  Wt Readings from Last 1 Encounters:  02/07/16 270 lb (122.5 kg)   [id number] mass index is 44.93 kg/m.     Last Height:   Ht Readings from Last 1 Encounters:  02/07/16 5\' 5"  (1.651 m)    Physical exam:  General: The patient is awake, alert and appears not in acute distress. The patient is well groomed. Head: Normocephalic, atraumatic. Neck is supple. Mallampati 4,  neck circumference: 17.45 . Nasal airflow restricted, congested . Retrognathia is seen. Macroglossia is noted, all biological teeth. Cardiovascular:  Regular rate and rhythm, without  murmurs or carotid bruit, and without distended neck veins. Respiratory: Lungs are clear to auscultation. Skin:  Without evidence of edema, or rash Trunk: BMI is 45, considered super obese  Neurologic exam : The patient is awake and alert, oriented to place and time.    Attention span & concentration ability appears normal.  Speech is fluent,  without dysarthria, but she is today hoarse with dysphonia -.  Mood and affect are appropriate.  Cranial nerves: Pupils are equal and briskly reactive to light. Extraocular movements  in vertical and horizontal planes intact and without nystagmus. Visual fields by finger perimetry are intact. Hearing to finger rub intact. Facial sensation intact to fine touch.Facial motor strength is symmetric and tongue and uvula move midline. Shoulder shrug was symmetrical.  Motor exam:   Normal tone, muscle bulk and symmetric strength in all extremities. Good bilateral grip strength  Sensory:  Fine touch, pinprick and vibration were tested in all  extremities. Proprioception tested in the upper extremities was normal. Coordination: Rapid alternating movements in the fingers/hands was normal. Finger-to-nose maneuver  normal without evidence of ataxia, dysmetria or tremor. Gait and station: Patient walks without assistive device and is able unassisted to climb up to the exam table. Strength within normal limits.  Stance is stable and normal.   Deep tendon reflexes: in the upper and lower extremities are symmetric and attenuated - There is significant edema around both ankles noted. The edema symmetrically present. Her fingers and hands are also very puffy. Babinski maneuver response is downgoing.  The patient was advised of the nature of the diagnosed sleep disorder , the treatment options and risks for general a health and wellness arising from not treating the condition.  I spent more than 45 minutes of face to face time with the patient. Greater than 50% of time was spent in counseling and coordination of care. We have discussed the diagnosis and differential and I answered the patient's questions.     Assessment:  After physical and neurologic examination, review of laboratory studies,  Personal review of imaging studies, reports of other /same  Imaging studies ,  Results  and pre-existing records as far as provided in visit.   1) Mrs. Aguero has several risk factors for obstructive sleep apnea, some are beyond her control such as her upper airway restriction, a tendency to have allergic asthma, bronchitis and today laryngitis and rhinitis as well. She does have an elevated body mass index and as an BMI of 45 is considered morbidly obese. This is her main risk factor. Obesity also contributes to asthma and contributes to GERD and may contribute to her lower extremity edema. Her husband has witnessed her to snore and has several times notched her when she was breathing irregularly. I strongly assume that the patient has obstructive sleep  apnea. She also doesn't get enough sleep as of this. I would like for her to have a TV free  environment to sleep in, and suggests that her husband may switch to an audio bulk with earplugs or earphones if he craves a background noise. She should not be exposed to screen light, neither TV, nor smart phone or lab top at night. I recommend that she takes her diuretics in the morning as not to introduce more bathroom breaks at night. To reduce salt intake. To eliminate caffeine after lunch. She could benefit by advancing her bedtime by one hour.  SPLIT , has sleep related headaches when waking up- CO2 shall be monitored.     Plan:  Treatment plan and additional workup :   SPLIT with CO2, her last severe sleep related headache was last Sunday.   Asencion Partridge Reynald Woods MD  02/07/2016   CC: [name], [address] [place], [place] [id number]

## 2016-02-23 ENCOUNTER — Ambulatory Visit (INDEPENDENT_AMBULATORY_CARE_PROVIDER_SITE_OTHER): Payer: BLUE CROSS/BLUE SHIELD | Admitting: Neurology

## 2016-02-23 DIAGNOSIS — R519 Headache, unspecified: Secondary | ICD-10-CM

## 2016-02-23 DIAGNOSIS — J4521 Mild intermittent asthma with (acute) exacerbation: Secondary | ICD-10-CM

## 2016-02-23 DIAGNOSIS — R51 Headache: Secondary | ICD-10-CM

## 2016-02-23 DIAGNOSIS — G4733 Obstructive sleep apnea (adult) (pediatric): Secondary | ICD-10-CM | POA: Diagnosis not present

## 2016-02-23 DIAGNOSIS — Z6841 Body Mass Index (BMI) 40.0 and over, adult: Secondary | ICD-10-CM

## 2016-02-23 DIAGNOSIS — J209 Acute bronchitis, unspecified: Secondary | ICD-10-CM

## 2016-02-23 DIAGNOSIS — R0683 Snoring: Secondary | ICD-10-CM

## 2016-02-28 NOTE — Procedures (Signed)
PATIENT'S NAME:  Makayla Good, Makayla Good DOB:      08/10/1970      MR#:    [id number]     DATE OF RECORDING: 02/23/2016 REFERRING M.D.:  Roma Schanz, DO Study Performed:   Baseline Polysomnogram HISTORY:  Makayla Good has a past medical history of gastroesophageal reflux disease, acute bronchitis seasonally, underlying asthma, hypertension, foot pain, stinging quality and Super- obesity with BMI of 45.  The patient endorsed the Epworth Sleepiness Scale at 7 points.   The patient's weight 269 pounds with a height of 65 (inches), resulting in a BMI of 44.8 kg/m2. The patient's neck circumference measured 17.45 inches.  CURRENT MEDICATIONS: Proventil, Norvasc, Toprol, Hydrochlorothiazide, Protonix, Saxenda, Nasonex   PROCEDURE:  This is a multichannel digital polysomnogram utilizing the Somnostar 11.2 system.  Electrodes and sensors were applied and monitored per AASM Specifications.   EEG, EOG, Chin and Limb EMG, were sampled at 200 Hz.  ECG, Snore and Nasal Pressure, Thermal Airflow, Respiratory Effort, CPAP Flow and Pressure, Oximetry was sampled at 50 Hz. Digital video and audio were recorded.      BASELINE STUDY Lights Out was at 22:54 and Lights On at 05:01.  Total recording time (TRT) was 367.5 minutes, with a total sleep time (TST) of 295 minutes.  The patient's sleep latency was 11.5 minutes.  REM latency was 109.5 minutes.  The sleep efficiency was 80.3 %.     SLEEP ARCHITECTURE: WASO (Wake after sleep onset) was 58.5 minutes.  There were 29.5 minutes in Stage N1, 178 minutes Stage N2, 57.5 minutes Stage N3 and 30 minutes in Stage REM.  The percentage of Stage N1 was 10.%, Stage N2 was 60.3%, Stage N3 was 19.5% and Stage R (REM sleep) was 10.2%.   RESPIRATORY ANALYSIS:  There were a total of 72 respiratory events:  8 obstructive apneas, 7 central apneas and 9 mixed apneas with a total of 24 apneas and an apnea index (AI) of 4.9 /hour. There were 48 hypopneas with a hypopnea index of 9.8  /hour. The patient also had 0 respiratory event related arousals (RERAs).  The total APNEA/HYPOPNEA INDEX (AHI) was 14.6/hour and the total RESPIRATORY DISTURBANCE INDEX was 14.6 /hour.  34 events occurred in REM sleep and 70 events in NREM. The REM AHI was 68 /hour, versus a non-REM AHI of 8.6. The patient spent 0 minutes of total sleep time in the supine position and 295 minutes in non-supine. The supine AHI was 0.0 versus a non-supine AHI of 14.7.  OXYGEN SATURATION & C02:  The Wake baseline 02 saturation was 92%, with the lowest being 74%. Time spent below 89% saturation equaled 128 minutes. Snoring was noted. EKG was in keeping with normal sinus rhythm (NSR).    PERIODIC LIMB MOVEMENTS:   The patient had a total of 111 Periodic Limb Movements.  The Periodic Limb Movement (PLM) index was 10.9 and the PLM Arousal index was 1.0/ hour.   IMPRESSION:  This patient presented with mild to moderate OSA, all in non supine position. AHI was 14.6/hr. OSA with strong REM dependence.  Oxygen desaturation time was less than 30 minutes.    RECOMMENDATIONS: Makayla Good should return for a full nght CPAP titration,  A dental device is not helpful in addressing REM dependent apnea.  There was no positional component elicited.   1. Further information regarding OSA may be obtained from [employer] (www.sleepfoundation.org) or American Sleep Apnea Association (www.sleepapnea.org). 2. A follow up appointment will be scheduled in  the Sleep Clinic at [facility]. The referring provider will be notified of the results.      I certify that I have reviewed the entire raw data recording prior to the issuance of this report in accordance with the Standards of Accreditation of the American Academy of Sleep Medicine (AASM)      Larey Seat, MD   02-28-2016 Diplomat, American Board of Psychiatry and Neurology  Diplomat, American Board of [place]  Director, [facility] at [employer]

## 2016-02-29 ENCOUNTER — Telehealth: Payer: Self-pay

## 2016-02-29 DIAGNOSIS — G4733 Obstructive sleep apnea (adult) (pediatric): Secondary | ICD-10-CM

## 2016-02-29 NOTE — Telephone Encounter (Signed)
I spoke to pt and advised her that her sleep study results revealed mild to moderate OSA, all in non supine position. AHI was 14.6/hr with a strong REM dependence. REM AHI was 68/hr. Dr. Brett Fairy recommends coming for a CPAP titration study and that a dental device is not helpful in addressing REM dependent apnea and that there was no positional component elicited. Pt is agreeable to coming in for a cpap titration and knows that our sleep lab will call her to schedule when the study has been approved by insurance. Pt verbalized understanding of results. Pt had no questions at this time but was encouraged to call back if questions arise.  Order for cpap titration not placed. Will send to Dr. Brett Fairy for review.

## 2016-03-21 ENCOUNTER — Ambulatory Visit (INDEPENDENT_AMBULATORY_CARE_PROVIDER_SITE_OTHER): Payer: BLUE CROSS/BLUE SHIELD | Admitting: Neurology

## 2016-03-21 DIAGNOSIS — G4733 Obstructive sleep apnea (adult) (pediatric): Secondary | ICD-10-CM

## 2016-03-30 ENCOUNTER — Telehealth: Payer: Self-pay | Admitting: Neurology

## 2016-03-30 DIAGNOSIS — G4733 Obstructive sleep apnea (adult) (pediatric): Secondary | ICD-10-CM

## 2016-03-30 NOTE — Telephone Encounter (Signed)
Makayla Good, please call patient and aarnge for CPAP at 9 cm water , Eson mask, medium size. CD

## 2016-03-30 NOTE — Procedures (Signed)
PATIENT'S NAME:  Makayla Good, Makayla Good DOB:      February 20, 1971      MR#:    [id number]     DATE OF RECORDING: 03/21/2016 REFERRING M.D.:  [name] Chase DO Study Performed:   CPAP  Titration HISTORY:  Pt here for titration following a diagnostic polysomnography study performed on 02/23/2016 with AHI of 14.6 and SPO2 nadir of 74%.The patient's weight 270 pounds with a height of 65 (inches), resulting in a BMI of 44.8 kg/m2.The patient's neck circumference measured 17.45 inches.  CURRENT MEDICATIONS: Proventil, Norvasc, Toprol, Hydrochlorothiazide, Protonix, Saxenda, Nasonex  PROCEDURE:  This is a multichannel digital polysomnogram utilizing the SomnoStar 11.2 system.  Electrodes and sensors were applied and monitored per AASM Specifications.   EEG, EOG, Chin and Limb EMG, were sampled at 200 Hz.  ECG, Snore and Nasal Pressure, Thermal Airflow, Respiratory Effort, CPAP Flow and Pressure, Oximetry was sampled at 50 Hz. Digital video and audio were recorded.      CPAP was initiated at 5 cmH20 with heated humidity per AASM split night standards and pressure was advanced to 9/9cmH20 because of hypopneas, apneas and desaturations.  At a PAP pressure of 9 cmH20, there was a reduction of the AHI to 0.0 with improvement of the above symptoms of obstructive sleep apnea.    Lights Out was at 21:25 and Lights On at 05:02. Total recording time (TRT) was 457 minutes, with a total sleep time (TST) of 299.5 minutes. The patient's sleep latency was 40 minutes with 0.5 minutes of wake time after sleep onset. REM latency was 113 minutes.  The sleep efficiency was 65.5 %.   WASO (Wake after sleep onset) was 117 minutes.  There were 14.5 minutes in Stage N1, 164 minutes Stage N2, 68.5 minutes Stage N3 and 52.5 minutes in Stage REM.    RESPIRATORY ANALYSIS:  There were a total of 0 respiratory events: 0 obstructive apneas, 0 central apneas and 0 hypopneas with 0 respiratory event related arousals (RERAs).     The total  APNEA/HYPOPNEA INDEX (AHI) was 0.0 /hour and the total RESPIRATORY DISTURBANCE INDEX was 0.0/ hr.  The patient spent 51.5 minutes of total sleep time in the supine position and 248 minutes in non-supine. The supine AHI was 0.0, versus a non-supine AHI of 0.0.  OXYGEN SATURATION & C02:  The baseline 02 saturation was 96%, with the lowest being 87%. Time spent below 89% saturation equaled 6 minutes.  PERIODIC LIMB MOVEMENTS:    The Periodic Limb Movement (PLM) index was 39.7 and the PLM Arousal index was 0 /hour.  DIAGNOSIS 1.  Primary Snoring and Obstructive Sleep Apnea have responded favorably to CPAP therapy at 9 cm water.   PLANS/RECOMMENDATIONS: CPAP 9 cm water, heated humidity. The Technologist noted the patient was fitted with a Respironics Nuance nasal pillow, medium size apparatus.  A follow up appointment will be scheduled in the Sleep Clinic at [facility].   Please call [phone] with any questions.      I certify that I have reviewed the entire raw data recording prior to the issuance of this report in accordance with the Standards of Accreditation of the American Academy of Sleep Medicine (AASM)    Larey Seat, M.D.  03-30-2016 Diplomat, American Board of Psychiatry and Neurology  Diplomat, [place] of Sleep Medicine Medical Director, [place] [facility] at [facility]

## 2016-03-30 NOTE — Telephone Encounter (Signed)
PATIENT'S NAME:  Makayla Good, Makayla Good DOB:      October 06, 1970      MR#:    [id number]     DATE OF RECORDING: 03/21/2016 REFERRING M.D.:  [name] Chase DO Study Performed:   CPAP  Titration HISTORY:  Pt here for titration following a diagnostic polysomnography study performed on 02/23/2016 with AHI of 14.6 and SPO2 nadir of 74%.The patient's weight 270 pounds with a height of 65 (inches), resulting in a BMI of 44.8 kg/m2.The patient's neck circumference measured 17.45 inches.  CURRENT MEDICATIONS: Proventil, Norvasc, Toprol, Hydrochlorothiazide, Protonix, Saxenda, Nasonex  PROCEDURE:  This is a multichannel digital polysomnogram utilizing the SomnoStar 11.2 system.  Electrodes and sensors were applied and monitored per AASM Specifications.   EEG, EOG, Chin and Limb EMG, were sampled at 200 Hz.  ECG, Snore and Nasal Pressure, Thermal Airflow, Respiratory Effort, CPAP Flow and Pressure, Oximetry was sampled at 50 Hz. Digital video and audio were recorded.      CPAP was initiated at 5 cmH20 with heated humidity per AASM split night standards and pressure was advanced to 9/9cmH20 because of hypopneas, apneas and desaturations.  At a PAP pressure of 9 cmH20, there was a reduction of the AHI to 0.0 with improvement of the above symptoms of obstructive sleep apnea.    Lights Out was at 21:25 and Lights On at 05:02. Total recording time (TRT) was 457 minutes, with a total sleep time (TST) of 299.5 minutes. The patient's sleep latency was 40 minutes with 0.5 minutes of wake time after sleep onset. REM latency was 113 minutes.  The sleep efficiency was 65.5 %.   WASO (Wake after sleep onset) was 117 minutes.  There were 14.5 minutes in Stage N1, 164 minutes Stage N2, 68.5 minutes Stage N3 and 52.5 minutes in Stage REM.    RESPIRATORY ANALYSIS:  There were a total of 0 respiratory events: 0 obstructive apneas, 0 central apneas and 0 hypopneas with 0 respiratory event related arousals (RERAs).     The total  APNEA/HYPOPNEA INDEX (AHI) was 0.0 /hour and the total RESPIRATORY DISTURBANCE INDEX was 0.0/ hr.  The patient spent 51.5 minutes of total sleep time in the supine position and 248 minutes in non-supine. The supine AHI was 0.0, versus a non-supine AHI of 0.0.  OXYGEN SATURATION & C02:  The baseline 02 saturation was 96%, with the lowest being 87%. Time spent below 89% saturation equaled 6 minutes.  PERIODIC LIMB MOVEMENTS:    The Periodic Limb Movement (PLM) index was 39.7 and the PLM Arousal index was 0 /hour.  DIAGNOSIS 1.  Primary Snoring and Obstructive Sleep Apnea have responded favorably to CPAP therapy at 9 cm water.   PLANS/RECOMMENDATIONS: CPAP 9 cm water, heated humidity. The Technologist noted the patient was fitted with a Respironics Nuance nasal pillow, medium size apparatus.  A follow up appointment will be scheduled in the Sleep Clinic at [facility].   Please call [phone] with any questions.      I certify that I have reviewed the entire raw data recording prior to the issuance of this report in accordance with the Standards of Accreditation of the American Academy of Sleep Medicine (AASM)    Larey Seat, M.D.  03-30-2016 Diplomat, American Board of Psychiatry and Neurology  Diplomat, [place] of Sleep Medicine Medical Director, [place] [facility] at [facility]

## 2016-04-03 NOTE — Telephone Encounter (Signed)
I spoke to patient and she is aware of results. She is willing to start treatment. I will orders to DME company. I will send report to PCP. Patient will get a letter reminding her to make f/u appt and stress the importance of compliance.

## 2016-07-05 ENCOUNTER — Ambulatory Visit (HOSPITAL_BASED_OUTPATIENT_CLINIC_OR_DEPARTMENT_OTHER): Payer: BLUE CROSS/BLUE SHIELD

## 2016-07-05 ENCOUNTER — Ambulatory Visit (INDEPENDENT_AMBULATORY_CARE_PROVIDER_SITE_OTHER): Payer: BLUE CROSS/BLUE SHIELD | Admitting: Family Medicine

## 2016-07-05 ENCOUNTER — Encounter: Payer: Self-pay | Admitting: Family Medicine

## 2016-07-05 VITALS — BP 136/80 | HR 101 | Temp 98.6°F | Resp 16 | Ht 65.0 in | Wt 270.0 lb

## 2016-07-05 DIAGNOSIS — R1011 Right upper quadrant pain: Secondary | ICD-10-CM | POA: Diagnosis not present

## 2016-07-05 DIAGNOSIS — R109 Unspecified abdominal pain: Secondary | ICD-10-CM | POA: Insufficient documentation

## 2016-07-05 LAB — COMPREHENSIVE METABOLIC PANEL
ALT: 21 U/L (ref 0–35)
AST: 17 U/L (ref 0–37)
Albumin: 4.1 g/dL (ref 3.5–5.2)
Alkaline Phosphatase: 74 U/L (ref 39–117)
BUN: 12 mg/dL (ref 6–23)
CO2: 30 mEq/L (ref 19–32)
Calcium: 9.8 mg/dL (ref 8.4–10.5)
Chloride: 104 mEq/L (ref 96–112)
Creatinine, Ser: 0.73 mg/dL (ref 0.40–1.20)
GFR: 110.55 mL/min (ref >60.00)
Glucose, Bld: 102 mg/dL — ABNORMAL HIGH (ref 70–99)
Potassium: 3.8 mEq/L (ref 3.5–5.1)
Sodium: 141 mEq/L (ref 135–145)
Total Bilirubin: 0.4 mg/dL (ref 0.2–1.2)
Total Protein: 7.4 g/dL (ref 6.0–8.3)

## 2016-07-05 LAB — CBC WITH DIFFERENTIAL/PLATELET
Basophils Absolute: 0.1 10*3/uL (ref 0.0–0.1)
Basophils Relative: 1.3 % (ref 0.0–3.0)
Eosinophils Absolute: 0.2 10*3/uL (ref 0.0–0.7)
Eosinophils Relative: 2.6 % (ref 0.0–5.0)
HCT: 39.4 % (ref 36.0–46.0)
Hemoglobin: 13.1 g/dL (ref 12.0–15.0)
Lymphocytes Relative: 43.9 % (ref 12.0–46.0)
Lymphs Abs: 3.5 10*3/uL (ref 0.7–4.0)
MCHC: 33.2 g/dL (ref 30.0–36.0)
MCV: 91.9 fl (ref 78.0–100.0)
Monocytes Absolute: 0.7 10*3/uL (ref 0.1–1.0)
Monocytes Relative: 9 % (ref 3.0–12.0)
Neutro Abs: 3.4 10*3/uL (ref 1.4–7.7)
Neutrophils Relative %: 43.2 % (ref 43.0–77.0)
Platelets: 263 10*3/uL (ref 150.0–400.0)
RBC: 4.28 Mil/uL (ref 3.87–5.11)
RDW: 14.3 % (ref 11.5–15.5)
WBC: 8 10*3/uL (ref 4.0–10.5)

## 2016-07-05 LAB — H. PYLORI ANTIBODY, IGG: H Pylori IgG: NEGATIVE

## 2016-07-05 LAB — AMYLASE: Amylase: 13 U/L — ABNORMAL LOW (ref 27–131)

## 2016-07-05 LAB — LIPASE: Lipase: 6 U/L — ABNORMAL LOW (ref 11.0–59.0)

## 2016-07-05 NOTE — Patient Instructions (Signed)

## 2016-07-05 NOTE — Progress Notes (Signed)
Pre visit review using our clinic review tool, if applicable. No additional management support is needed unless otherwise documented below in the visit note. 

## 2016-07-05 NOTE — Progress Notes (Signed)
Patient ID: Makayla Good, female   DOB: May 26, 1970, 46 y.o.   MRN: [id number]     Subjective:    Patient ID: Makayla Good, female    DOB: 27-Aug-1970, 46 y.o.   MRN: [id number]  Chief Complaint  Patient presents with  . Abdominal Pain    RUQ    Abdominal Pain  This is a new problem. Episode onset: 2 weeks ago. The problem occurs intermittently. The pain is located in the RUQ. The quality of the pain is aching. Associated symptoms include flatus. Pertinent negatives include no belching, constipation, diarrhea, dysuria, fever, frequency, headaches, hematochezia, hematuria, melena, nausea or vomiting. Associated symptoms comments: Bloating . The pain is aggravated by eating.    Patient is in today for abdominal pain.  Patient Care Team: [name], DO as PCP - General   Past Medical History:  Diagnosis Date  . Asthma   . Eczema   . H/O oophorectomy   . Hyperlipidemia   . Hypertension   . Obesity   . Polycystic ovary    Left    Past Surgical History:  Procedure Laterality Date  . CESAREAN SECTION  02/01/2005  . TUBAL LIGATION  02/01/2005    Family History  Problem Relation Age of Onset  . Hypertension Mother   . Schizophrenia Mother   . Dementia Mother   . Mental illness Mother     schizophrenia, dementia  . Hypertension Father   . Coronary artery disease Father     Stent  . Hypertension Sister   . Kidney disease    . Hypertension Sister   . Arthritis Sister     rheumatoid  . Dementia Maternal Aunt     Social History   Social History  . Marital status: Married    Spouse name: N/A  . Number of children: N/A  . Years of education: 78   Occupational History  . 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 Smoking status: Current Some Day Smoker    Packs/day: 0.50    Years: 16.00    Types: Cigarettes    Start date: 03/23/1993  . Smokeless tobacco: Never Used     Comment: smokeless cigarettes  . Alcohol use Yes  .  Drug use: No  . Sexual activity: Yes    Partners: Male     Comment: been with same person over 20 years   Other Topics Concern  . Not on file   Social History Narrative   Exercise--gym, every other day--- at least 3 days a week    Outpatient Medications Prior to Visit  Medication Sig Dispense Refill  . albuterol (PROVENTIL HFA;VENTOLIN HFA) 108 (90 Base) MCG/ACT inhaler Inhale 2 puffs into the lungs every 6 (six) hours as needed for wheezing or shortness of breath. 1 Inhaler 2  . amLODipine (NORVASC) 10 MG tablet Take 1 tablet (10 mg total) by mouth daily. 90 tablet 1  . hydrochlorothiazide (HYDRODIURIL) 25 MG tablet Take 1 tablet (25 mg total) by mouth daily. 90 tablet 1  . metoprolol succinate (TOPROL-XL) 25 MG 24 hr tablet Take 1 tablet (25 mg total) by mouth daily. 90 tablet 1  . Multiple Vitamin (MULTIVITAMIN) capsule Take 1 capsule by mouth daily.    . pantoprazole (PROTONIX) 40 MG tablet Take 1 tablet (40 mg total) by mouth daily. 90 tablet 1  . potassium chloride SA (K-DUR,KLOR-CON) 20 MEQ tablet Take 1 tablet (20 mEq total) by mouth daily. Repeat  labs are due now 90 tablet 1  . Liraglutide -Weight Management (SAXENDA) 18 MG/3ML SOPN Inject 3 mg into the skin daily. (Patient not taking: Reported on 02/07/2016) 5 pen 3  . mometasone (NASONEX) 50 MCG/ACT nasal spray Place 2 sprays into the nose daily. (Patient not taking: Reported on 02/07/2016) 17 g 2   No facility-administered medications prior to visit.     No Known Allergies  Review of Systems  Constitutional: Negative for fever and malaise/fatigue.  HENT: Negative for congestion.   Eyes: Negative for blurred vision.  Respiratory: Negative for cough and shortness of breath.   Cardiovascular: Negative for chest pain, palpitations and leg swelling.  Gastrointestinal: Positive for abdominal pain and flatus. Negative for constipation, diarrhea, hematochezia, melena, nausea and vomiting.  Genitourinary: Negative for dysuria,  frequency and hematuria.  Musculoskeletal: Negative for back pain.  Skin: Negative for rash.  Neurological: Negative for loss of consciousness and headaches.       Objective:    Physical Exam  Constitutional: She is oriented to person, place, and time. She appears well-developed and well-nourished. No distress.  HENT:  Head: Normocephalic and atraumatic.  Eyes: Conjunctivae are normal.  Neck: Normal range of motion. No thyromegaly present.  Cardiovascular: Normal rate and regular rhythm.   Pulmonary/Chest: Effort normal and breath sounds normal. She has no wheezes.  Abdominal: Soft. Bowel sounds are normal. There is tenderness. There is guarding.  RUQ  Musculoskeletal: Normal range of motion. She exhibits no edema or deformity.  Neurological: She is alert and oriented to person, place, and time.  Skin: Skin is warm and dry. She is not diaphoretic.  Psychiatric: She has a normal mood and affect.    BP 136/80 (BP Location: Left Arm, Cuff Size: Large)   Pulse (!) 101   Temp 98.6 F (37 C) (Oral)   Resp 16   Ht 5\' 5"  (1.651 m)   Wt 270 lb (122.5 kg)   LMP 06/28/2016   SpO2 96%   BMI 44.93 kg/m  Wt Readings from Last 3 Encounters:  07/05/16 270 lb (122.5 kg)  02/07/16 270 lb (122.5 kg)  01/23/16 271 lb 12.8 oz (123.3 kg)      Immunization History  Administered Date(s) Administered  . Influenza Nasal 01/21/2012  . Influenza, Seasonal, Injecte, Preservative Fre 01/31/2015  . Influenza,inj,Quad PF,36+ Mos 02/11/2013  . Influenza-Unspecified 01/02/2016  . Pneumococcal Polysaccharide-23 05/14/2011  . Tdap 09/04/2010    Health Maintenance  Topic Date Due  . MAMMOGRAM  11/20/2016  . PAP SMEAR  01/23/2019  . TETANUS/TDAP  09/03/2021  . INFLUENZA VACCINE  Addressed  . HIV Screening  Completed    Lab Results  Component Value Date   WBC 8.0 07/05/2016   HGB 13.1 07/05/2016   HCT 39.4 07/05/2016   PLT 263.0 07/05/2016   GLUCOSE 102 (H) 07/05/2016   CHOL 161  01/23/2016   TRIG 142.0 01/23/2016   HDL 38.60 (L) 01/23/2016   LDLCALC 94 01/23/2016   ALT 21 07/05/2016   AST 17 07/05/2016   NA 141 07/05/2016   K 3.8 07/05/2016   CL 104 07/05/2016   CREATININE 0.73 07/05/2016   BUN 12 07/05/2016   CO2 30 07/05/2016   TSH 2.47 01/23/2016   HGBA1C 6.5 07/01/2015   MICROALBUR <0.7 01/23/2016    Lab Results  Component Value Date   TSH 2.47 01/23/2016   Lab Results  Component Value Date   WBC 8.0 07/05/2016   HGB 13.1 07/05/2016   HCT 39.4  07/05/2016   MCV 91.9 07/05/2016   PLT 263.0 07/05/2016   Lab Results  Component Value Date   NA 141 07/05/2016   K 3.8 07/05/2016   CO2 30 07/05/2016   GLUCOSE 102 (H) 07/05/2016   BUN 12 07/05/2016   CREATININE 0.73 07/05/2016   BILITOT 0.4 07/05/2016   ALKPHOS 74 07/05/2016   AST 17 07/05/2016   ALT 21 07/05/2016   PROT 7.4 07/05/2016   ALBUMIN 4.1 07/05/2016   CALCIUM 9.8 07/05/2016   GFR 110.55 07/05/2016   Lab Results  Component Value Date   CHOL 161 01/23/2016   Lab Results  Component Value Date   HDL 38.60 (L) 01/23/2016   Lab Results  Component Value Date   LDLCALC 94 01/23/2016   Lab Results  Component Value Date   TRIG 142.0 01/23/2016   Lab Results  Component Value Date   CHOLHDL 4 01/23/2016   Lab Results  Component Value Date   HGBA1C 6.5 07/01/2015         Assessment & Plan:   Problem List Items Addressed This Visit      Unprioritized   Right upper quadrant abdominal pain - Primary    No NVD No constipation [place] abd   r/o GB        Relevant Orders   Comprehensive metabolic panel (Completed)   CBC with Differential/Platelet (Completed)   Lipase (Completed)   Amylase (Completed)   US Abdomen Limited RUQ (Completed)   H. pylori antibody, IgG (Completed)    if pain worsens-- go to ER  I have discontinued Ms. Tippetts's mometasone and Liraglutide -Weight Management. I am also having her maintain her multivitamin, potassium chloride SA,  pantoprazole, metoprolol succinate, hydrochlorothiazide, amLODipine, and albuterol.  No orders of the defined types were placed in this encounter.   CMA served as Education administrator during this visit. History, Physical and Plan performed by medical provider. Documentation and orders reviewed and attested to.  [name], DO

## 2016-07-05 NOTE — Assessment & Plan Note (Signed)
No NVD No constipation [place] abd   r/o GB

## 2016-07-06 ENCOUNTER — Ambulatory Visit (HOSPITAL_BASED_OUTPATIENT_CLINIC_OR_DEPARTMENT_OTHER)
Admission: RE | Admit: 2016-07-06 | Discharge: 2016-07-06 | Disposition: A | Discharge status: Home / Self Care | Payer: BLUE CROSS/BLUE SHIELD | Source: Ambulatory Visit | Attending: Family Medicine | Admitting: Family Medicine

## 2016-07-06 DIAGNOSIS — R109 Unspecified abdominal pain: Secondary | ICD-10-CM | POA: Diagnosis not present

## 2016-07-06 DIAGNOSIS — R1011 Right upper quadrant pain: Secondary | ICD-10-CM | POA: Diagnosis not present

## 2016-09-09 ENCOUNTER — Other Ambulatory Visit: Payer: Self-pay | Admitting: Family Medicine

## 2016-09-09 DIAGNOSIS — I10 Essential (primary) hypertension: Secondary | ICD-10-CM

## 2016-10-19 ENCOUNTER — Other Ambulatory Visit: Payer: Self-pay | Admitting: Family Medicine

## 2016-10-19 DIAGNOSIS — Z1231 Encounter for screening mammogram for malignant neoplasm of breast: Secondary | ICD-10-CM

## 2016-10-23 DIAGNOSIS — M25561 Pain in right knee: Secondary | ICD-10-CM | POA: Diagnosis not present

## 2016-10-23 DIAGNOSIS — M25562 Pain in left knee: Secondary | ICD-10-CM | POA: Diagnosis not present

## 2016-11-02 ENCOUNTER — Other Ambulatory Visit (HOSPITAL_COMMUNITY): Payer: Self-pay | Admitting: Surgery

## 2016-11-02 ENCOUNTER — Encounter: Payer: Self-pay | Admitting: Surgery

## 2016-11-02 DIAGNOSIS — M1990 Unspecified osteoarthritis, unspecified site: Secondary | ICD-10-CM | POA: Diagnosis not present

## 2016-11-02 DIAGNOSIS — I10 Essential (primary) hypertension: Secondary | ICD-10-CM | POA: Diagnosis not present

## 2016-11-15 ENCOUNTER — Encounter: Payer: BLUE CROSS/BLUE SHIELD | Attending: Surgery | Admitting: Registered"

## 2016-11-15 ENCOUNTER — Encounter: Payer: Self-pay | Admitting: Registered"

## 2016-11-15 DIAGNOSIS — E669 Obesity, unspecified: Secondary | ICD-10-CM

## 2016-11-15 DIAGNOSIS — Z713 Dietary counseling and surveillance: Secondary | ICD-10-CM | POA: Insufficient documentation

## 2016-11-15 NOTE — Progress Notes (Signed)
Pre-Op Assessment Visit:  Pre-Operative Sleeve Gastrectomy Surgery  Medical Nutrition Therapy:  Appt start time: 2:50  End time:  3:53  Patient was seen on 11/15/2016 for Pre-Operative Nutrition Assessment. Assessment and letter of approval faxed to [facility] Surgery Bariatric Surgery Program coordinator on 11/15/2016.   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 Pt states she has been working on quitting smoking. Pt states she works at [employer]; new gym coming soon. Pt sates she does not eat a lot of rice or bread; enjoys mac and cheese.  Pt is unsure of how many visits needed according to  insurance.    24 hr Dietary Recall: First Meal: 3 boiled eggs Snack: fruit, yogurt   Second Meal: salad or leftovers or chicken salad wrap or [place] burger Snack:  fruit, yogurt Third Meal: baked chicken, broccoli or spaghetti or salad or stir fry Snack: sometimes peanut butter crackers Beverages: water, coffee, hot green tea, sweet tea (rarely)  Encouraged to engage in 150 minutes of moderate physical activity including cardiovascular and weight baring weekly  Handouts given during visit include:  . Pre-Op Goals . Bariatric Surgery Protein Shakes . Vitamin and Mineral Recommendations  During the appointment today the following Pre-Op Goals were reviewed with the patient: . Maintain or lose weight as instructed by your surgeon . Make healthy food choices . Begin to limit portion sizes . Limited concentrated sugars and fried foods . Keep fat/sugar in the single digits per serving on          food labels . Practice CHEWING your food  (aim for 30 chews per bite or until applesauce consistency) . Practice not drinking 15  minutes before, during, and 30 minutes after each meal/snack . Avoid all carbonated beverages  . Avoid/limit caffeinated beverages  . Avoid all sugar-sweetened beverages . Consume 3 meals per day; eat every 3-5 hours . Make a list of non-food related activities . Aim for 64-100 ounces of FLUID daily  . Aim for at least 60-80 grams of PROTEIN daily . Look for a liquid protein source that contain ?15 g protein and ?5 g carbohydrate  (ex: shakes, drinks, shots) . Physical activity is an important part of a healthy lifestyle so keep it moving!  Follow diet recommendations listed below Energy and Macronutrient Recommendations: Calories: 1600 Carbohydrate: 180 Protein: 120 Fat: 44  Demonstrated degree of understanding via:  Teach Back   Teaching Method Utilized:  Visual Auditory Hands on  Barriers to learning/adherence to lifestyle change: none  Patient to call the Nutrition and Diabetes Education Services to enroll in Pre-Op and Post-Op Nutrition Education when surgery date is scheduled.

## 2016-11-23 ENCOUNTER — Ambulatory Visit
Admission: RE | Admit: 2016-11-23 | Discharge: 2016-11-23 | Disposition: A | Discharge status: Home / Self Care | Payer: BLUE CROSS/BLUE SHIELD | Source: Ambulatory Visit | Attending: Family Medicine | Admitting: Family Medicine

## 2016-11-23 DIAGNOSIS — Z1231 Encounter for screening mammogram for malignant neoplasm of breast: Secondary | ICD-10-CM | POA: Diagnosis not present

## 2016-11-27 ENCOUNTER — Other Ambulatory Visit: Payer: Self-pay | Admitting: Family Medicine

## 2016-11-27 DIAGNOSIS — R928 Other abnormal and inconclusive findings on diagnostic imaging of breast: Secondary | ICD-10-CM

## 2016-11-28 ENCOUNTER — Ambulatory Visit (HOSPITAL_COMMUNITY)
Admission: RE | Admit: 2016-11-28 | Discharge: 2016-11-28 | Disposition: A | Discharge status: Home / Self Care | Payer: BLUE CROSS/BLUE SHIELD | Source: Ambulatory Visit | Attending: Surgery | Admitting: Surgery

## 2016-11-28 ENCOUNTER — Telehealth: Payer: Self-pay | Admitting: *Deleted

## 2016-11-28 ENCOUNTER — Other Ambulatory Visit: Payer: Self-pay

## 2016-11-28 DIAGNOSIS — I517 Cardiomegaly: Secondary | ICD-10-CM | POA: Insufficient documentation

## 2016-11-28 DIAGNOSIS — Z01818 Encounter for other preprocedural examination: Secondary | ICD-10-CM | POA: Insufficient documentation

## 2016-11-28 DIAGNOSIS — R918 Other nonspecific abnormal finding of lung field: Secondary | ICD-10-CM | POA: Diagnosis not present

## 2016-11-28 NOTE — Telephone Encounter (Signed)
Received Physician Orders from [facility]; forwarded to covering provider/SLS 08/8

## 2016-11-30 ENCOUNTER — Ambulatory Visit: Payer: BLUE CROSS/BLUE SHIELD

## 2016-11-30 ENCOUNTER — Ambulatory Visit
Admission: RE | Admit: 2016-11-30 | Discharge: 2016-11-30 | Disposition: A | Discharge status: Home / Self Care | Payer: BLUE CROSS/BLUE SHIELD | Source: Ambulatory Visit | Attending: Family Medicine | Admitting: Family Medicine

## 2016-11-30 DIAGNOSIS — R928 Other abnormal and inconclusive findings on diagnostic imaging of breast: Secondary | ICD-10-CM

## 2016-12-08 DIAGNOSIS — F509 Eating disorder, unspecified: Secondary | ICD-10-CM | POA: Diagnosis not present

## 2016-12-13 ENCOUNTER — Encounter: Payer: BLUE CROSS/BLUE SHIELD | Attending: Surgery | Admitting: Registered"

## 2016-12-13 ENCOUNTER — Encounter: Payer: Self-pay | Admitting: Registered"

## 2016-12-13 DIAGNOSIS — Z713 Dietary counseling and surveillance: Secondary | ICD-10-CM | POA: Diagnosis not present

## 2016-12-13 DIAGNOSIS — E669 Obesity, unspecified: Secondary | ICD-10-CM

## 2016-12-13 NOTE — Progress Notes (Signed)
Appt start time: 3:27 end time: 3:48  Assessment: 1st SWL Appointment.   Start Wt at NDES: 273.4 Wt: 270.3 BMI: 44.30   Pt arrives having lost 3 lbs from previous visit. Pt states she has been working on making healthy food choices  (baking instead of frying), choosing steamed vegetables, and meal prepping during the week. Pt states she is also keeping fat/sugar in single digits. Pt states she is drinking 64+ oz of fluid a day. Pt is doing well with making behavioral changes and excited about this new journey with surgery.   Pt states she needs 6 SWL visits with [place] prior to surgery.    MEDICATIONS: See list   DIETARY INTAKE:  24-hr recall:  B ( AM): 2 boiled eggs, 3 pcs of [place] bacon  Snk ( AM): protein bar  L ( PM): steamed vegetables, grilled chicken Snk ( PM): grapefruit or crackers or greek yogurt D ( PM): grilled chicken, steamed vegetables Snk ( PM): sometimes peanut butter crackers or sugar-free jello or cereal Beverages: water, coffee, 2% milk  Usual physical activity: walking 15-18min/day, 3 days/week  Diet to Follow: 1600 calories 180 g carbohydrates 120 g protein 44 g fat  Preferred Learning Style:   No preference indicated   Learning Readiness:   Ready  Change in progress     Nutritional Diagnosis:  [place]-3.3 Overweight/obesity related to past poor dietary habits and physical inactivity as evidenced by patient w/ planned sleeve gastrectomy surgery following dietary guidelines for continued weight loss.    Intervention:  Nutrition counseling for upcoming Bariatric Surgery.  Goals:  - Try 1% lactose-free milk.  - Continue to increase physical activity to more days during the week. Aim for 4-5 days/week.  - Look for a liquid protein source that contain ?15 g protein and ?5 g carbohydrate (ex: shakes, drinks, shots).  Teaching Method Utilized:  Visual Auditory  Handouts given during visit include:  none   Barriers to learning/adherence to  lifestyle change: none  Demonstrated degree of understanding via:  Teach Back   Monitoring/Evaluation:  Dietary intake, exercise, and body weight in 1 month(s).

## 2016-12-13 NOTE — Patient Instructions (Signed)
-   Try 1% lactose-free milk.   - Continue to increase physical activity to more days during the week. Aim for 4-5 days/week.   - Look for a liquid protein source that contain ?15 g protein and ?5 g carbohydrate (ex: shakes, drinks, shots).

## 2016-12-27 ENCOUNTER — Other Ambulatory Visit: Payer: Self-pay | Admitting: Family Medicine

## 2016-12-27 DIAGNOSIS — I10 Essential (primary) hypertension: Secondary | ICD-10-CM

## 2016-12-28 NOTE — Telephone Encounter (Signed)
Faxed 90d till OV/thx dmf

## 2016-12-29 DIAGNOSIS — F509 Eating disorder, unspecified: Secondary | ICD-10-CM | POA: Diagnosis not present

## 2017-01-10 ENCOUNTER — Encounter: Payer: Self-pay | Admitting: Registered"

## 2017-01-10 ENCOUNTER — Encounter: Payer: BLUE CROSS/BLUE SHIELD | Attending: Surgery | Admitting: Registered"

## 2017-01-10 DIAGNOSIS — Z713 Dietary counseling and surveillance: Secondary | ICD-10-CM | POA: Insufficient documentation

## 2017-01-10 DIAGNOSIS — E669 Obesity, unspecified: Secondary | ICD-10-CM

## 2017-01-10 NOTE — Progress Notes (Signed)
Appt start time: 3:35 end time: 4:05  Assessment: 2nd SWL Appointment.   Start Wt at NDES: 273.4 Wt: 269.6 BMI: 44.18   Pt arrives having lost 3 lbs from previous visit. Pt states she has been having stomach pains for the past 3 weeks and unsure of what may be the cause. Pt states she has not been able to exercise much due to helping son get ready for college next year and younger son playing soccer. Pt states she has been suffering with depression lately. Pt states she is in competition with coworker comparing daily steps.  Pt states she has been working on making healthy food choices  (baking instead of frying), choosing steamed vegetables, and meal prepping during the week. Pt states she is also keeping fat/sugar in single digits. Pt states she is drinking 64+ oz of fluid a day. Pt is doing well with making behavioral changes and excited about this new journey with surgery.   Pt states she needs 6 SWL visits with [place] prior to surgery.    MEDICATIONS: See list   DIETARY INTAKE:  24-hr recall:  B ( AM): 2 boiled eggs, 3 pcs of [place] bacon  Snk ( AM): protein bar  L ( PM): steamed vegetables, grilled chicken Snk ( PM): grapefruit or crackers or greek yogurt D ( PM): grilled chicken, steamed vegetables Snk ( PM): sometimes peanut butter crackers or sugar-free jello or cereal Beverages: water, coffee, 2% milk  Usual physical activity: walking 15-54min/day, 3 days/week  Diet to Follow: 1600 calories 180 g carbohydrates 120 g protein 44 g fat  Preferred Learning Style:   No preference indicated   Learning Readiness:   Ready  Change in progress     Nutritional Diagnosis:  [place]-3.3 Overweight/obesity related to past poor dietary habits and physical inactivity as evidenced by patient w/ planned sleeve gastrectomy surgery following dietary guidelines for continued weight loss.    Intervention:  Nutrition counseling for upcoming Bariatric Surgery.  Goals:  - Aim to wake up  at 5am, have 10 min of quiet time (5-5:10am) and 20 min of physical activity (5:10-5:30am) for 60 min of physical activity a week.  - Aim to chew at least 30 times per bite or to applesauce consistency.  - Track your food intake to see what may be triggering abdominal pain.  Teaching Method Utilized:  Visual Auditory  Handouts given during visit include:  none   Barriers to learning/adherence to lifestyle change: none  Demonstrated degree of understanding via:  Teach Back   Monitoring/Evaluation:  Dietary intake, exercise, and body weight in 1 month(s).

## 2017-01-10 NOTE — Patient Instructions (Addendum)
-   Aim to wake up at 5am, have 10 min of quiet time (5-5:10am) and 20 min of physical activity (5:10-5:30am) for 60 min of physical activity a week.   - Aim to chew at least 30 times per bite or to applesauce consistency.   - Track your food intake to see what may be triggering abdominal pain.

## 2017-02-01 ENCOUNTER — Institutional Professional Consult (permissible substitution): Payer: BLUE CROSS/BLUE SHIELD | Admitting: Pulmonary Disease

## 2017-02-06 ENCOUNTER — Encounter: Payer: Self-pay | Admitting: Registered"

## 2017-02-06 ENCOUNTER — Encounter: Payer: BLUE CROSS/BLUE SHIELD | Attending: Surgery | Admitting: Registered"

## 2017-02-06 DIAGNOSIS — Z713 Dietary counseling and surveillance: Secondary | ICD-10-CM | POA: Diagnosis not present

## 2017-02-06 DIAGNOSIS — E669 Obesity, unspecified: Secondary | ICD-10-CM

## 2017-02-06 NOTE — Patient Instructions (Addendum)
-   Continue to work on quitting smoking.    -  Track food and fluid intake; use Baritastic app and MyFitnessPal.  - Try not to skip meals; aim for at least 3 meals.

## 2017-02-06 NOTE — Progress Notes (Signed)
Appt start time: 3:20 end time: 3:40  Assessment: 3rd SWL Appointment.   Start Wt at NDES: 273.4 Wt: 269.0 BMI: 44.08   Pt arrives having maintained weight from previous visit. Pt states she has increased physical activity recently with walking and biking at work gym. Pt states she is still working on chewing. Pt states she is smoking about 1/2 pack a day; working to quit smoking. Pt reports she has a visit scheduled with PCP tomorrow and will receive prescription for patches to help quit smoking. Pt states her abdominal pain has improved since previous visit. 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 Pt states she was discouraged by weight on scale because it didn't decrease and she has been making changes with increasing physical activity and making healthier choices.   Pt states she needs 6 SWL visits with [place] prior to surgery.    MEDICATIONS: See list   DIETARY INTAKE:  24-hr recall:  B ( AM): 3 boiled eggs Snk ( AM): popcorn L ( PM):sometimes skips; wrap (chicken salad, spinach, tomatoes, pickles, black olives, oil and vinegar)  Snk ( PM): Pure protein bar D ( PM): grilled chicken, non-starchy vegetables Snk ( PM): sometimes peanut butter crackers or sugar-free jello or cereal Beverages: water, coffee, 2% milk  Usual physical activity: walking 20-33min/day, 3-4 days/week  Diet to Follow: 1600 calories 180 g carbohydrates 120 g protein 44 g fat  Preferred Learning Style:   No preference indicated   Learning Readiness:   Ready  Change in progress     Nutritional Diagnosis:  [place]-3.3 Overweight/obesity related to past poor dietary habits and physical inactivity as evidenced by patient w/ planned sleeve gastrectomy surgery following dietary guidelines for continued weight loss.    Intervention:  Nutrition counseling for upcoming Bariatric Surgery.  Goals:  - Continue to work on quitting smoking.   -  Track food and fluid intake; use Baritastic app  and MyFitnessPal. - Try not to skip meals; aim for at least 3 meals.   Teaching Method Utilized:  Visual Auditory  Handouts given during visit include:  Baritastic App   Barriers to learning/adherence to lifestyle change: none  Demonstrated degree of understanding via:  Teach Back   Monitoring/Evaluation:  Dietary intake, exercise, and body weight in 1 month(s).

## 2017-02-07 ENCOUNTER — Ambulatory Visit (INDEPENDENT_AMBULATORY_CARE_PROVIDER_SITE_OTHER): Payer: BLUE CROSS/BLUE SHIELD | Admitting: Family Medicine

## 2017-02-07 VITALS — BP 134/86 | HR 90 | Temp 98.4°F | Ht 66.0 in | Wt 273.0 lb

## 2017-02-07 DIAGNOSIS — Z0000 Encounter for general adult medical examination without abnormal findings: Secondary | ICD-10-CM | POA: Diagnosis not present

## 2017-02-07 DIAGNOSIS — E785 Hyperlipidemia, unspecified: Secondary | ICD-10-CM

## 2017-02-07 DIAGNOSIS — I10 Essential (primary) hypertension: Secondary | ICD-10-CM | POA: Diagnosis not present

## 2017-02-07 DIAGNOSIS — R42 Dizziness and giddiness: Secondary | ICD-10-CM

## 2017-02-07 MED ORDER — NICOTINE 21 MG/24HR TD PT24: 21.0000 mg | MEDICATED_PATCH | Freq: Every day | TRANSDERMAL | 0 refills | Status: DC

## 2017-02-07 NOTE — Patient Instructions (Addendum)
Dizziness Dizziness is a common problem. It is a feeling of unsteadiness or light-headedness. You may feel like you are about to faint. Dizziness can lead to injury if you stumble or fall. Anyone can become dizzy, but dizziness is more common in older adults. This condition can be caused by a number of things, including medicines, dehydration, or illness. Follow these instructions at home: Taking these steps may help with your condition: Eating and drinking   Drink enough fluid to keep your urine clear or pale yellow. This helps to keep you from becoming dehydrated. Try to drink more clear fluids, such as water.  Do not drink alcohol.  Limit your caffeine intake if directed by your health care provider.  Limit your salt intake if directed by your health care provider. Activity   Avoid making quick movements.  Rise slowly from chairs and steady yourself until you feel okay.  In the morning, first sit up on the side of the bed. When you feel okay, stand slowly while you hold onto something until you know that your balance is fine.  Move your legs often if you need to stand in one place for a long time. Tighten and relax your muscles in your legs while you are standing.  Do not drive or operate heavy machinery if you feel dizzy.  Avoid bending down if you feel dizzy. Place items in your home so that they are easy for you to reach without leaning over. Lifestyle   Do not use any tobacco products, including cigarettes, chewing tobacco, or electronic cigarettes. If you need help quitting, ask your health care provider.  Try to reduce your stress level, such as with yoga or meditation. Talk with your health care provider if you need help. General instructions   Watch your dizziness for any changes.  Take medicines only as directed by your health care provider. Talk with your health care provider if you think that your dizziness is caused by a medicine that you are taking.  Tell a friend  or a family member that you are feeling dizzy. If he or she notices any changes in your behavior, have this person call your health care provider.  Keep all follow-up visits as directed by your health care provider. This is important. Contact a health care provider if:  Your dizziness does not go away.  Your dizziness or light-headedness gets worse.  You feel nauseous.  You have reduced hearing.  You have new symptoms.  You are unsteady on your feet or you feel like the room is spinning. Get help right away if:  You vomit or have diarrhea and are unable to eat or drink anything.  You have problems talking, walking, swallowing, or using your arms, hands, or legs.  You feel generally weak.  You are not thinking clearly or you have trouble forming sentences. It may take a friend or family member to notice this.  You have chest pain, abdominal pain, shortness of breath, or sweating.  Your vision changes.  You notice any bleeding.  You have a headache.  You have neck pain or a stiff neck.  You have a fever. This information is not intended to replace advice given to you by your health care provider. Make sure you discuss any questions you have with your health care provider. Document Released: 10/03/2000 Document Revised: 09/15/2015 Document Reviewed: 04/05/2014 Elsevier Interactive Patient Education  2017 Elsevier Inc.  

## 2017-02-07 NOTE — Progress Notes (Signed)
Patient ID: Makayla Good, female    DOB: 17-Jul-1970  Age: 46 y.o. MRN: [id number]    Subjective:  Subjective  HPI Makayla Good presents for dizziness that was occurring everyday about 2 months ago but not is only a few times a week.  They last a few seconds only.      Review of Systems  Constitutional: Negative for chills and fever.  HENT: Negative for congestion and hearing loss.   Eyes: Negative for discharge.  Respiratory: Negative for cough and shortness of breath.   Cardiovascular: Negative for chest pain, palpitations and leg swelling.  Gastrointestinal: Negative for abdominal pain, blood in stool, constipation, diarrhea, nausea and vomiting.  Genitourinary: Negative for dysuria, frequency, hematuria and urgency.  Musculoskeletal: Negative for back pain and myalgias.  Skin: Negative for rash.  Allergic/Immunologic: Negative for environmental allergies.  Neurological: Positive for dizziness and light-headedness. Negative for weakness and headaches.  Hematological: Does not bruise/bleed easily.  Psychiatric/Behavioral: Negative for suicidal ideas. The patient is not nervous/anxious.     History Past Medical History:  Diagnosis Date  . Asthma   . Eczema   . H/O oophorectomy   . Hyperlipidemia   . Hypertension   . Obesity   . Polycystic ovary    Left    She has a past surgical history that includes Cesarean section (02/01/2005) and Tubal ligation (02/01/2005).   Her family history includes Arthritis in her sister; Coronary artery disease in her father; Dementia in her maternal aunt and mother; Hypertension in her father, mother, sister, and sister; Kidney disease in her unknown relative; Mental illness in her mother; Schizophrenia in her mother.She reports that she has been smoking Cigarettes.  She started smoking about 23 years ago. She has a 8.00 pack-year smoking history. She has never used smokeless tobacco. She reports that she drinks alcohol. She reports  that she does not use drugs.  Current Outpatient Prescriptions on File Prior to Visit  Medication Sig Dispense Refill  . albuterol (PROVENTIL HFA;VENTOLIN HFA) 108 (90 Base) MCG/ACT inhaler Inhale 2 puffs into the lungs every 6 (six) hours as needed for wheezing or shortness of breath. 1 Inhaler 2  . amLODipine (NORVASC) 10 MG tablet take 1 tablet by mouth once daily 90 tablet 1  . hydrochlorothiazide (HYDRODIURIL) 25 MG tablet take 1 tablet by mouth once daily 90 tablet 1  . metoprolol succinate (TOPROL-XL) 25 MG 24 hr tablet take 1 tablet by mouth once daily 90 tablet 1  . Multiple Vitamin (MULTIVITAMIN) capsule Take 1 capsule by mouth daily.    . Omega-3 Fatty Acids (FISH OIL) 1000 MG CAPS Take by mouth.    . pantoprazole (PROTONIX) 40 MG tablet Take 1 tablet (40 mg total) by mouth daily. 90 tablet 1  . potassium chloride SA (K-DUR,KLOR-CON) 20 MEQ tablet take 1 tablet by mouth once daily **REPEAT LABS ARE DUE NOW** 90 tablet 1  . vitamin B-12 (CYANOCOBALAMIN) 100 MCG tablet Take 100 mcg by mouth daily.     No current facility-administered medications on file prior to visit.      Objective:  Objective  Physical Exam  Constitutional: She is oriented to person, place, and time. She appears well-developed and well-nourished.  HENT:  Head: Normocephalic and atraumatic.  Eyes: Conjunctivae and EOM are normal.  Neck: Normal range of motion. Neck supple. No JVD present. Carotid bruit is not present. No thyromegaly present.  Cardiovascular: Normal rate, regular rhythm and normal heart sounds.   No murmur heard. Pulmonary/Chest:  Effort normal and breath sounds normal. No respiratory distress. She has no wheezes. She has no rales. She exhibits no tenderness.  Musculoskeletal: She exhibits no edema.  Neurological: She is alert and oriented to person, place, and time.  Psychiatric: She has a normal mood and affect.  Vitals reviewed.  BP 134/86   Pulse 90   Temp 98.4 F (36.9 C) (Oral)    Ht 5\' 6"  (1.676 m)   Wt 273 lb (123.8 kg)   LMP 02/03/2017   SpO2 98%   BMI 44.06 kg/m  Wt Readings from Last 3 Encounters:  02/07/17 273 lb (123.8 kg)  02/06/17 269 lb (122 kg)  01/10/17 269 lb 9.6 oz (122.3 kg)     Lab Results  Component Value Date   WBC 7.9 02/07/2017   HGB 13.0 02/07/2017   HCT 39.9 02/07/2017   PLT 262.0 02/07/2017   GLUCOSE 86 02/07/2017   CHOL 155 02/07/2017   TRIG 96.0 02/07/2017   HDL 38.50 (L) 02/07/2017   LDLCALC 97 02/07/2017   ALT 22 02/07/2017   AST 23 02/07/2017   NA 138 02/07/2017   K 4.1 02/07/2017   CL 99 02/07/2017   CREATININE 0.70 02/07/2017   BUN 10 02/07/2017   CO2 29 02/07/2017   TSH 2.03 02/07/2017   HGBA1C 6.5 07/01/2015   MICROALBUR <0.7 01/23/2016    Mm Diag Breast Tomo Uni Right  Result Date: 11/30/2016 CLINICAL DATA:  Callback from screening mammogram for possible asymmetry right breast EXAM: 2D DIGITAL DIAGNOSTIC UNILATERAL RIGHT MAMMOGRAM WITH CAD AND ADJUNCT TOMO COMPARISON:  Previous exam(s). ACR Breast Density Category b: There are scattered areas of fibroglandular density. FINDINGS: Cc and MLO views of the right breast, spot compression right MLO view are submitted. Previously questioned asymmetry does not persist on additional views. The area of concern on the MLO view is unchanged compared to prior mammogram of November 16, 2013. Mammographic images were processed with CAD. IMPRESSION: Benign findings. RECOMMENDATION: Routine screening mammogram back on schedule. I have discussed the findings and recommendations with the patient. Results were also provided in writing at the conclusion of the visit. If applicable, a reminder letter will be sent to the patient regarding the next appointment. BI-RADS CATEGORY  2: Benign. Electronically Signed   By: Abelardo Diesel M.D.   On: 11/30/2016 13:32     Assessment & Plan:  Plan  I am having Makayla Good start on nicotine. I am also having her maintain her multivitamin, pantoprazole,  albuterol, potassium chloride SA, amLODipine, hydrochlorothiazide, Fish Oil, vitamin B-12, and metoprolol succinate.  Meds ordered this encounter  Medications  . nicotine (NICODERM CQ) 21 mg/24hr patch    Sig: Place 1 patch (21 mg total) onto the skin daily.    Dispense:  28 patch    Refill:  0    Problem List Items Addressed This Visit      Unprioritized   Benign essential HTN    Well controlled, no changes to meds. Encouraged heart healthy diet such as the DASH diet and exercise as tolerated.        Dizziness - Primary    Check labs ? Etiology ekg nsr      Relevant Orders   Vitamin B12 (Completed)   Vitamin D 1,25 dihydroxy   EKG 12-Lead (Completed)   Hyperlipidemia LDL goal <100    Encouraged heart healthy diet, increase exercise, avoid trans fats, consider a krill oil cap daily      Preventative health care  ghm utd Check labs See AVS      Relevant Orders   TSH (Completed)   Lipid panel (Completed)   CBC with Differential/Platelet (Completed)   Comprehensive metabolic panel (Completed)   Severe obesity (BMI >= 40) (HCC)    D/w pt diet and exercise         Follow-up: Return if symptoms worsen or fail to improve.  [name], DO

## 2017-02-07 NOTE — Progress Notes (Signed)
Pre visit review using our clinic tool,if applicable. No additional management support is needed unless otherwise documented below in the visit note.  

## 2017-02-08 ENCOUNTER — Encounter: Payer: Self-pay | Admitting: Family Medicine

## 2017-02-08 DIAGNOSIS — Z0000 Encounter for general adult medical examination without abnormal findings: Secondary | ICD-10-CM | POA: Insufficient documentation

## 2017-02-08 DIAGNOSIS — R42 Dizziness and giddiness: Secondary | ICD-10-CM | POA: Insufficient documentation

## 2017-02-08 LAB — COMPREHENSIVE METABOLIC PANEL
ALT: 22 U/L (ref 0–35)
AST: 23 U/L (ref 0–37)
Albumin: 4.1 g/dL (ref 3.5–5.2)
Alkaline Phosphatase: 68 U/L (ref 39–117)
BUN: 10 mg/dL (ref 6–23)
CO2: 29 mEq/L (ref 19–32)
Calcium: 9.8 mg/dL (ref 8.4–10.5)
Chloride: 99 mEq/L (ref 96–112)
Creatinine, Ser: 0.7 mg/dL (ref 0.40–1.20)
GFR: 115.73 mL/min (ref >60.00)
Glucose, Bld: 86 mg/dL (ref 70–99)
Potassium: 4.1 mEq/L (ref 3.5–5.1)
Sodium: 138 mEq/L (ref 135–145)
Total Bilirubin: 0.5 mg/dL (ref 0.2–1.2)
Total Protein: 7.7 g/dL (ref 6.0–8.3)

## 2017-02-08 LAB — CBC WITH DIFFERENTIAL/PLATELET
Basophils Absolute: 0.1 10*3/uL (ref 0.0–0.1)
Basophils Relative: 1.2 % (ref 0.0–3.0)
Eosinophils Absolute: 0.2 10*3/uL (ref 0.0–0.7)
Eosinophils Relative: 3 % (ref 0.0–5.0)
HCT: 39.9 % (ref 36.0–46.0)
Hemoglobin: 13 g/dL (ref 12.0–15.0)
Lymphocytes Relative: 51.4 % — ABNORMAL HIGH (ref 12.0–46.0)
Lymphs Abs: 4.1 10*3/uL — ABNORMAL HIGH (ref 0.7–4.0)
MCHC: 32.5 g/dL (ref 30.0–36.0)
MCV: 94.9 fl (ref 78.0–100.0)
Monocytes Absolute: 0.5 10*3/uL (ref 0.1–1.0)
Monocytes Relative: 6.9 % (ref 3.0–12.0)
Neutro Abs: 3 10*3/uL (ref 1.4–7.7)
Neutrophils Relative %: 37.5 % — ABNORMAL LOW (ref 43.0–77.0)
Platelets: 262 10*3/uL (ref 150.0–400.0)
RBC: 4.21 Mil/uL (ref 3.87–5.11)
RDW: 14.3 % (ref 11.5–15.5)
WBC: 7.9 10*3/uL (ref 4.0–10.5)

## 2017-02-08 LAB — LIPID PANEL
Cholesterol: 155 mg/dL (ref 0–200)
HDL: 38.5 mg/dL — ABNORMAL LOW (ref >39.00)
LDL Cholesterol: 97 mg/dL (ref 0–99)
NonHDL: 116.45
Total CHOL/HDL Ratio: 4
Triglycerides: 96 mg/dL (ref 0.0–149.0)
VLDL: 19.2 mg/dL (ref 0.0–40.0)

## 2017-02-08 LAB — TSH: TSH: 2.03 u[IU]/mL (ref 0.35–4.50)

## 2017-02-08 LAB — VITAMIN B12: Vitamin B-12: >1500 pg/mL — ABNORMAL HIGH (ref 211–911)

## 2017-02-08 NOTE — Assessment & Plan Note (Signed)
Encouraged heart healthy diet, increase exercise, avoid trans fats, consider a krill oil cap daily 

## 2017-02-08 NOTE — Assessment & Plan Note (Signed)
D/w pt diet and exercise  

## 2017-02-08 NOTE — Assessment & Plan Note (Signed)
Well controlled, no changes to meds. Encouraged heart healthy diet such as the DASH diet and exercise as tolerated.  °

## 2017-02-08 NOTE — Assessment & Plan Note (Signed)
Check labs ? Etiology ekg nsr

## 2017-02-08 NOTE — Assessment & Plan Note (Signed)
ghm utd Check labs See AVS 

## 2017-02-11 ENCOUNTER — Other Ambulatory Visit: Payer: Self-pay | Admitting: Family Medicine

## 2017-02-11 ENCOUNTER — Other Ambulatory Visit: Payer: Self-pay

## 2017-02-11 DIAGNOSIS — E785 Hyperlipidemia, unspecified: Secondary | ICD-10-CM

## 2017-02-11 DIAGNOSIS — I10 Essential (primary) hypertension: Secondary | ICD-10-CM

## 2017-02-11 NOTE — Telephone Encounter (Signed)
Called Pt to inform her of lab results and follow up labs. Pt states she will comply with 6 month follow up but during call she informed me that her Rx for nicotine patches had not gone through. I will call pharmacy and give verbal orders at this time.

## 2017-02-12 LAB — VITAMIN D 1,25 DIHYDROXY
Vitamin D 1, 25 (OH)2 Total: 40 pg/mL (ref 18–72)
Vitamin D2 1, 25 (OH)2: <8 pg/mL
Vitamin D3 1, 25 (OH)2: 40 pg/mL

## 2017-02-18 ENCOUNTER — Telehealth: Payer: Self-pay | Admitting: Family Medicine

## 2017-02-18 NOTE — Telephone Encounter (Signed)
nicoderm patches were not covered by insurance She does not want to go back on Chantix Advise on something else---BTW she will call her insurance co. Tomorrow to find out what they can offer.

## 2017-02-19 NOTE — Telephone Encounter (Signed)
Relation to MC:EYEM Call back [phone] Pharmacy: [facility] [address] [place], [place] [phone] ([phone] (Fax)     Reason for call:  Patient spoke with insurance and they informed her they will cover "nicotine dis 21mg /24h" please fax to [phone]

## 2017-02-19 NOTE — Telephone Encounter (Signed)
Patches are over the counter -- she can get a coupon at [email or web address].  Com Let us know what her ins co says

## 2017-02-22 NOTE — Telephone Encounter (Signed)
Ok to fax rx.  

## 2017-02-22 NOTE — Telephone Encounter (Signed)
Called pharmacy and they ran just a generic thru.  It went thru and they have to order for Monday.  Patient notified.

## 2017-03-05 ENCOUNTER — Ambulatory Visit: Payer: BLUE CROSS/BLUE SHIELD | Admitting: Pulmonary Disease

## 2017-03-05 ENCOUNTER — Encounter: Payer: Self-pay | Admitting: Pulmonary Disease

## 2017-03-05 VITALS — BP 122/78 | HR 96 | Ht 66.0 in | Wt 267.0 lb

## 2017-03-05 DIAGNOSIS — J4530 Mild persistent asthma, uncomplicated: Secondary | ICD-10-CM | POA: Diagnosis not present

## 2017-03-05 DIAGNOSIS — F17200 Nicotine dependence, unspecified, uncomplicated: Secondary | ICD-10-CM | POA: Diagnosis not present

## 2017-03-05 DIAGNOSIS — G4733 Obstructive sleep apnea (adult) (pediatric): Secondary | ICD-10-CM | POA: Insufficient documentation

## 2017-03-05 DIAGNOSIS — J4520 Mild intermittent asthma, uncomplicated: Secondary | ICD-10-CM | POA: Diagnosis not present

## 2017-03-05 DIAGNOSIS — J45909 Unspecified asthma, uncomplicated: Secondary | ICD-10-CM | POA: Insufficient documentation

## 2017-03-05 NOTE — Assessment & Plan Note (Signed)
Smoking cessation was encouraged.  She is at high risk for relapse continue nicotine patches for now

## 2017-03-05 NOTE — Progress Notes (Signed)
Subjective:    Patient ID: Makayla Good, female    DOB: Sep 12, 1970, 46 y.o.   MRN: [id number]  HPI  Chief Complaint  Patient presents with  . Sleep Consult    Has a history of OSA, but is not using a CPAP. Had a SS done back in 2017. Is currently trying to qualify for bariatic surgery.     46 year old obese woman presents for evaluation of sleep disordered breathing and surgical clearance for bariatric surgery due to her history of smoking.  She has undergone bariatric evaluation and is in the last stages before being scheduled.  She smoked about half pack per day starting as a teenager, about 15 pack years.  She quit about 3 years ago with Chantix for 4 months and then relapsed.  She is now trying to quit again and has started nicotine patch 21 mg and has not smoked for the last 3 days. She reports mild intermittent asthma symptoms for the last 15 years without any childhood history of asthma requiring albuterol for rescue very uncommonly about once a month, triggers being weather changes and URIs. Spirometry was obtained today which showed mild restriction with ratio 79, FEV1 72% and FVC of 73%.  She was diagnosed with moderate obstructive sleep apnea.  NP SG 02/2016 showed AHI of 15/hour with a REM related AHI of 68/hour.  Her weight then was 270 pounds.  This was corrected by CPAP of 9 cm however she could not afford then and did not get started on CPAP. Epworth sleepiness score is 10 and she reports excessive daytime tiredness. Snoring has been noted by her husband.  Bedtime is between 10 and 11 PM, sleep latency is minimal, she sleeps on her side with one pillow, reports 1-2 nocturnal awakenings including nocturia and is out of bed by 5:30 AM feeling rested with occasional dryness of mouth and headaches.  Her weight is unchanged over the last year since her sleep study. There is no history suggestive of cataplexy, sleep paralysis or parasomnias   Chest x-ray 11/2016 was reviewed  which showed mild cardiomegaly     Past Medical History:  Diagnosis Date  . Asthma   . Eczema   . H/O oophorectomy   . Hyperlipidemia   . Hypertension   . Obesity   . Polycystic ovary    Left   Past Surgical History:  Procedure Laterality Date  . CESAREAN SECTION  02/01/2005  . TUBAL LIGATION  02/01/2005    No Known Allergies   Social History   Socioeconomic History  . Marital status: Married    Spouse name: Not on file  . Number of children: Not on file  . Years of education: 47  . Highest education level: Not on file  Social Needs  . Financial resource strain: Not on file  . Food insecurity - worry: Not on file  . Food insecurity - inability: Not on file  . Transportation needs - medical: Not on file  . Transportation needs - non-medical: Not on file  Occupational History  . Occupation: release Horticulturist, commercial: [employer]  Tobacco Use  . Smoking status: Former Smoker    Packs/day: 0.50    Years: 16.00    Pack years: 8.00    Types: Cigarettes    Start date: 03/23/1993    Last attempt to quit: 03/01/2017    Years since quitting: 0.0  . Smokeless tobacco: Never Used  . Tobacco comment: smokeless cigarettes  Substance  and Sexual Activity  . Alcohol use: Yes  . Drug use: No  . Sexual activity: Yes    Partners: Male    Comment: been with same person over 20 years  Other Topics Concern  . Not on file  Social History Narrative   Exercise--gym, every other day--- at least 3 days a week      Family History  Problem Relation Age of Onset  . Hypertension Mother   . Schizophrenia Mother   . Dementia Mother   . Mental illness Mother        schizophrenia, dementia  . Hypertension Father   . Coronary artery disease Father        Stent  . Hypertension Sister   . Hypertension Sister   . Arthritis Sister        rheumatoid  . Dementia Maternal Aunt   . Kidney disease Unknown     Review of Systems Positive for acid heartburn, tooth problems,  itching, feet swelling  Constitutional: negative for anorexia, fevers and sweats  Eyes: negative for irritation, redness and visual disturbance  Ears, nose, mouth, throat, and face: negative for earaches, epistaxis, nasal congestion and sore throat  Respiratory: negative for cough, dyspnea on exertion, sputum and wheezing  Cardiovascular: negative for chest pain, dyspnea, lower extremity edema, orthopnea, palpitations and syncope  Gastrointestinal: negative for abdominal pain, constipation, diarrhea, melena, nausea and vomiting  Genitourinary:negative for dysuria, frequency and hematuria  Hematologic/lymphatic: negative for bleeding, easy bruising and lymphadenopathy  Musculoskeletal:negative for arthralgias, muscle weakness and stiff joints  Neurological: negative for coordination problems, gait problems, headaches and weakness  Endocrine: negative for diabetic symptoms including polydipsia, polyuria and weight loss      Objective:   Physical Exam   Gen. Pleasant, obese, in no distress, normal affect ENT - no lesions, no post nasal drip, class 2-3 airway Neck: No JVD, no thyromegaly, no carotid bruits Lungs: no use of accessory muscles, no dullness to percussion, decreased without rales or rhonchi  Cardiovascular: Rhythm regular, heart sounds  normal, no murmurs or gallops, no peripheral edema Abdomen: soft and non-tender, no hepatosplenomegaly, BS normal. Musculoskeletal: No deformities, no cyanosis or clubbing Neuro:  alert, non focal, no tremors        Assessment & Plan:

## 2017-03-05 NOTE — Patient Instructions (Signed)
Rx for CPAP 9 cm with nasal pillows DL in 4 wks

## 2017-03-05 NOTE — Assessment & Plan Note (Signed)
Appears to be mild intermittent, no evidence of airway obstruction today to suggest COPD.  As such she would be surgically cleared for bariatric surgery she would be at moderate risk for perioperative complications given her OSA. She can use her albuterol on as-needed basis

## 2017-03-05 NOTE — Assessment & Plan Note (Signed)
We will initiate CPAP at 9 cm with nasal pillows and humidity. Download will be checked in 4 weeks. Compliance was emphasized   The pathophysiology of obstructive sleep apnea , it's cardiovascular consequences & modes of treatment including CPAP were discused with the patient in detail & they evidenced understanding.  Weight loss encouraged, compliance with goal of at least 4-6 hrs every night is the expectation. Advised against medications with sedative side effects Cautioned against driving when sleepy - understanding that sleepiness will vary on a day to day basis

## 2017-03-07 ENCOUNTER — Encounter: Payer: BLUE CROSS/BLUE SHIELD | Attending: Surgery | Admitting: Registered"

## 2017-03-07 ENCOUNTER — Encounter: Payer: Self-pay | Admitting: Registered"

## 2017-03-07 DIAGNOSIS — Z713 Dietary counseling and surveillance: Secondary | ICD-10-CM | POA: Diagnosis not present

## 2017-03-07 DIAGNOSIS — E669 Obesity, unspecified: Secondary | ICD-10-CM

## 2017-03-07 NOTE — Progress Notes (Signed)
Appt start time: 3:50 end time: 4:10  Assessment: 4th SWL Appointment.   Start Wt at [facility]: 273.4 Wt: 267.6 BMI: 43.85   Pt arrives having lost ~1.4 lbs from previous visit. Pt states she will need to get a CPAP machine in 7-10 days. Pt states she was told she only needed 1 more visit with [place] prior to surgery. Pt states she was hoping to have surgery before Jan 2018. Pt states she is wearing a patch and has been smoke-free for 1 week. Pt states she has been substituting smoking with sunflower seeds. Pt states she is doing well with getting 64 ounces of fluid a day. Pt states she is doing ok with getting 3 meals a day. Pt states she is doing well with not drinking 15 minutes before, not while eating, and waiting 30 minutes after eating. Pt states she is doing well with chewing 30 times per bite. Pt is doing great making healthy behavior changes.   Pt states she needs 6 SWL visits with [place] prior to surgery.    MEDICATIONS: See list   DIETARY INTAKE:  24-hr recall:  B ( AM): 3 boiled eggs, 3 slices of [place] bacon Snk ( AM): popcorn L ( PM): [place], green beans, macaroni cheese, stuffing  Snk ( PM): Pure protein bar D ( PM): baked chicken Snk ( PM): popcorn, fruit, protein shake Beverages: water, coffee, 2% milk  Usual physical activity: walking 20-58min/day, 3-4 days/week  Diet to Follow: 1600 calories 180 g carbohydrates 120 g protein 44 g fat  Preferred Learning Style:   No preference indicated   Learning Readiness:   Ready  Change in progress     Nutritional Diagnosis:  -3.3 Overweight/obesity related to past poor dietary habits and physical inactivity as evidenced by patient w/ planned sleeve gastrectomy surgery following dietary guidelines for continued weight loss.    Intervention:  Nutrition counseling for upcoming Bariatric Surgery.  Goals:  - Continue to keep up the routine of eating 3 meals a day with snacks as needed. - Create a workout regimen when at  home.  - Keep up the good work with what you're doing!  Teaching Method Utilized:  Visual Auditory  Handouts given during visit include:  none   Barriers to learning/adherence to lifestyle change: none  Demonstrated degree of understanding via:  Teach Back   Monitoring/Evaluation:  Dietary intake, exercise, and body weight in 1 month(s).

## 2017-03-07 NOTE — Patient Instructions (Signed)
-   Continue to keep up the routine of eating 3 meals a day with snacks as needed.  - Create a workout regimen when at home.   - Keep up the good work with what you're doing!

## 2017-03-10 ENCOUNTER — Other Ambulatory Visit: Payer: Self-pay | Admitting: Family Medicine

## 2017-03-10 DIAGNOSIS — K219 Gastro-esophageal reflux disease without esophagitis: Secondary | ICD-10-CM

## 2017-03-26 DIAGNOSIS — M79672 Pain in left foot: Secondary | ICD-10-CM | POA: Diagnosis not present

## 2017-03-26 DIAGNOSIS — M722 Plantar fascial fibromatosis: Secondary | ICD-10-CM | POA: Diagnosis not present

## 2017-03-26 DIAGNOSIS — G4733 Obstructive sleep apnea (adult) (pediatric): Secondary | ICD-10-CM | POA: Diagnosis not present

## 2017-03-28 ENCOUNTER — Encounter: Payer: BLUE CROSS/BLUE SHIELD | Attending: Surgery | Admitting: Registered"

## 2017-03-28 ENCOUNTER — Encounter: Payer: Self-pay | Admitting: Registered"

## 2017-03-28 DIAGNOSIS — Z713 Dietary counseling and surveillance: Secondary | ICD-10-CM | POA: Insufficient documentation

## 2017-03-28 DIAGNOSIS — E669 Obesity, unspecified: Secondary | ICD-10-CM

## 2017-03-28 NOTE — Patient Instructions (Signed)
Keep up the great work!

## 2017-03-28 NOTE — Progress Notes (Signed)
Appt start time: 3:40 end time: 4:00  Assessment: 5th SWL Appointment.   Start Wt at [facility]: 273.4 Wt: 270.8 BMI: 44.38   Pt arrives having lost ~3.2 lbs from previous visit. Pt states she has her CPAP machine; getting used to it. Pt states she feels good. Pt states she is doing well with not smoking and wearing nicotene patch. Pt states she has noticed an improvement with breathing when exercising. Pt states she has been eating 3 meals a day most days. Pt states she is glad to be waiting for a few more months to have surgery because she has a lot going on right now. Pt states she is starting to experience issues with in her ankle and knee at times therefore alternates exercises. Pt reports rotating between biking, walking, elliptical and other cardio machines. Pt is doing well with being consistent with behavior changes.   Pt needs 1 more SWL visit with [place].   Pt states she is wearing a patch and has been smoke-free for 1 week. Pt states she has been substituting smoking with sunflower seeds. Pt states she is doing well with getting 64 ounces of fluid a day. Pt states she is doing ok with getting 3 meals a day. Pt states she is doing well with not drinking 15 minutes before, not while eating, and waiting 30 minutes after eating. Pt states she is doing well with chewing 30 times per bite. Pt is doing great making healthy behavior changes.   Pt states she needs 6 SWL visits with [place] prior to surgery.    MEDICATIONS: See list; recently prescribed Meloxicam   DIETARY INTAKE:  24-hr recall:  B ( AM): 3 boiled eggs, 3 slices of [place] bacon Snk ( AM): protein shake/bar L ( PM): [place], green beans, macaroni cheese, stuffing  Snk ( PM): sometimes  D ( PM): baked chicken Snk ( PM): popcorn, fruit, protein shake Beverages: water, coffee, 2% milk  Usual physical activity: biking, elliptical 20-66min/day, 3 days/week  Diet to Follow: 1600 calories 180 g carbohydrates 120 g protein 44 g  fat  Preferred Learning Style:   No preference indicated   Learning Readiness:   Ready  Change in progress     Nutritional Diagnosis:  [place]-3.3 Overweight/obesity related to past poor dietary habits and physical inactivity as evidenced by patient w/ planned sleeve gastrectomy surgery following dietary guidelines for continued weight loss.    Intervention:  Nutrition counseling for upcoming Bariatric Surgery.  Goals:  - Keep up the great work!  Teaching Method Utilized:  Visual Auditory  Handouts given during visit include:  none   Barriers to learning/adherence to lifestyle change: none  Demonstrated degree of understanding via:  Teach Back   Monitoring/Evaluation:  Dietary intake, exercise, and body weight in 1 month(s).

## 2017-04-08 ENCOUNTER — Ambulatory Visit: Payer: BLUE CROSS/BLUE SHIELD | Admitting: Adult Health

## 2017-04-09 DIAGNOSIS — M79672 Pain in left foot: Secondary | ICD-10-CM | POA: Diagnosis not present

## 2017-04-09 DIAGNOSIS — M7662 Achilles tendinitis, left leg: Secondary | ICD-10-CM | POA: Diagnosis not present

## 2017-04-11 ENCOUNTER — Other Ambulatory Visit: Payer: Self-pay | Admitting: Family Medicine

## 2017-04-11 DIAGNOSIS — I10 Essential (primary) hypertension: Secondary | ICD-10-CM

## 2017-04-17 DIAGNOSIS — M79672 Pain in left foot: Secondary | ICD-10-CM | POA: Diagnosis not present

## 2017-04-17 DIAGNOSIS — M7662 Achilles tendinitis, left leg: Secondary | ICD-10-CM | POA: Diagnosis not present

## 2017-04-19 ENCOUNTER — Other Ambulatory Visit: Payer: Self-pay | Admitting: Family Medicine

## 2017-04-19 DIAGNOSIS — I10 Essential (primary) hypertension: Secondary | ICD-10-CM

## 2017-04-23 HISTORY — PX: BREAST BIOPSY: SHX20

## 2017-04-25 DIAGNOSIS — M7662 Achilles tendinitis, left leg: Secondary | ICD-10-CM | POA: Diagnosis not present

## 2017-04-25 DIAGNOSIS — M79672 Pain in left foot: Secondary | ICD-10-CM | POA: Diagnosis not present

## 2017-04-26 DIAGNOSIS — G4733 Obstructive sleep apnea (adult) (pediatric): Secondary | ICD-10-CM | POA: Diagnosis not present

## 2017-04-29 ENCOUNTER — Encounter: Payer: Self-pay | Admitting: Registered"

## 2017-04-29 ENCOUNTER — Encounter: Payer: BLUE CROSS/BLUE SHIELD | Attending: Surgery | Admitting: Registered"

## 2017-04-29 DIAGNOSIS — Z713 Dietary counseling and surveillance: Secondary | ICD-10-CM | POA: Diagnosis not present

## 2017-04-29 DIAGNOSIS — E669 Obesity, unspecified: Secondary | ICD-10-CM

## 2017-04-29 NOTE — Progress Notes (Signed)
Appt start time: 3:20 end time: 3:40  Assessment: 6th SWL Appointment.   Start Wt at [facility]: 273.4 Wt: 276.5 BMI: 45.31   Pt arrives having gained 5.7 lbs from previous visit. Pt states she has a bone spur on the back on heel, not able to do treadmill. Pt states she's curently doing therapy 2x/week. Pt states she has also been biking and using  weights 3 days/week. Pt states she has been doing well with CPAP machine and sleeping better at night.   Pt is doing well with being consistent with behavior changes.   Pt states she is wearing a patch and has been smoke-free for 1 week. Pt states she has been substituting smoking with sunflower seeds. Pt states she is doing well with getting 64 ounces of fluid a day. Pt states she is doing ok with getting 3 meals a day. Pt states she is doing well with not drinking 15 minutes before, not while eating, and waiting 30 minutes after eating. Pt states she is doing well with chewing 30 times per bite. Pt is doing great making healthy behavior changes.   Pt states she needs 6 SWL visits with [place] prior to surgery.    MEDICATIONS: See list; recently prescribed Meloxicam   DIETARY INTAKE:  24-hr recall:  B ( AM): 3 boiled eggs, 3 slices of [place] bacon Snk ( AM): protein shake/bar L ( PM): [place], green beans, macaroni cheese, stuffing  Snk ( PM): sometimes  D ( PM): baked chicken Snk ( PM): popcorn, fruit, protein shake Beverages: water, coffee (1 cup/day), 2% milk  Usual physical activity: biking and weights 3 days/week  Diet to Follow: 1600 calories 180 g carbohydrates 120 g protein 44 g fat  Preferred Learning Style:   No preference indicated   Learning Readiness:   Ready  Change in progress     Nutritional Diagnosis:  [place]-3.3 Overweight/obesity related to past poor dietary habits and physical inactivity as evidenced by patient w/ planned sleeve gastrectomy surgery following dietary guidelines for continued weight loss.     Intervention:  Nutrition counseling for upcoming Bariatric Surgery.  Goals:  - Reduce caffeine intake as surgery day approaches.  - Try lactose-free milk 2% milk, 1%, or skim.  - Keep up the great work with what you are already doing. You are doing great!  Teaching Method Utilized:  Visual Auditory  Handouts given during visit include:  none   Barriers to learning/adherence to lifestyle change: none  Demonstrated degree of understanding via:  Teach Back   Monitoring/Evaluation:  Dietary intake, exercise, and body weight prn.

## 2017-04-29 NOTE — Patient Instructions (Signed)
-   Reduce caffeine intake as surgery day approaches.   - Try lactose-free milk 2% milk, 1%, or skim.   - Keep up the great work with what you are already doing. You are doing great!

## 2017-05-03 ENCOUNTER — Encounter: Payer: Self-pay | Admitting: Adult Health

## 2017-05-03 ENCOUNTER — Ambulatory Visit: Payer: BLUE CROSS/BLUE SHIELD | Admitting: Adult Health

## 2017-05-03 DIAGNOSIS — G4733 Obstructive sleep apnea (adult) (pediatric): Secondary | ICD-10-CM | POA: Diagnosis not present

## 2017-05-03 NOTE — Progress Notes (Signed)
@Patient  ID: Makayla Good, female    DOB: December 27, 1970, 47 y.o.   MRN: [id number]  Chief Complaint  Patient presents with  . Follow-up    OSA     Referring provider: [name], *  HPI: 47 year old female with moderate obstructive sleep apnea started on CPAP November 2018  TEST   NP SG 02/2016 showed AHI of 15/hour with a REM related AHI of 68/hour  05/03/2017 Follow up : OSA  Patient has underlying moderate sleep apnea.  She returns for a follow-up after recently starting on CPAP.  Says she is feeling better with less daytime sleepiness.  Download shows excellent compliance with average usage at  6 hours.  She is on CPAP 9 cm H2O.  AHI 0.2.  Minimum leaks.  She is planning upcoming gastric sleeve surgery in the next month obese.   No Known Allergies  Immunization History  Administered Date(s) Administered  . Influenza Nasal 01/21/2012  . Influenza, Seasonal, Injecte, Preservative Fre 01/31/2015  . Influenza,inj,Quad PF,6+ Mos 02/11/2013  . Influenza-Unspecified 01/02/2016, 12/28/2016  . Pneumococcal Polysaccharide-23 05/14/2011  . Tdap 09/04/2010    Past Medical History:  Diagnosis Date  . Asthma   . Eczema   . H/O oophorectomy   . Hyperlipidemia   . Hypertension   . Obesity   . Polycystic ovary    Left    Tobacco History: Social History   Tobacco Use  Smoking Status Former Smoker  . Packs/day: 0.50  . Years: 16.00  . Pack years: 8.00  . Types: Cigarettes  . Start date: 03/23/1993  . Last attempt to quit: 03/01/2017  . Years since quitting: 0.1  Smokeless Tobacco Never Used  Tobacco Comment   smokeless cigarettes   Counseling given: Not Answered Comment: smokeless cigarettes   Outpatient Encounter Medications as of 05/03/2017  Medication Sig  . albuterol (PROVENTIL HFA;VENTOLIN HFA) 108 (90 Base) MCG/ACT inhaler Inhale 2 puffs into the lungs every 6 (six) hours as needed for wheezing or shortness of breath.  Marland Kitchen amLODipine (NORVASC)  10 MG tablet take 1 tablet by mouth once daily  . hydrochlorothiazide (HYDRODIURIL) 25 MG tablet Take 1 tablet (25 mg total) by mouth daily.  . meloxicam (MOBIC) 15 MG tablet Take 15 mg by mouth daily.  . metoprolol succinate (TOPROL-XL) 25 MG 24 hr tablet take 1 tablet by mouth once daily  . Multiple Vitamin (MULTIVITAMIN) capsule Take 1 capsule by mouth daily.  . nicotine (NICODERM CQ) 21 mg/24hr patch Place 1 patch (21 mg total) onto the skin daily.  . Omega-3 Fatty Acids (FISH OIL) 1000 MG CAPS Take by mouth.  . pantoprazole (PROTONIX) 40 MG tablet take 1 tablet by mouth once daily  . potassium chloride SA (K-DUR,KLOR-CON) 20 MEQ tablet Take 1 tablet (20 mEq total) by mouth daily.  . vitamin B-12 (CYANOCOBALAMIN) 100 MCG tablet Take 100 mcg by mouth daily.   No facility-administered encounter medications on file as of 05/03/2017.      Review of Systems  Constitutional:   No  weight loss, night sweats,  Fevers, chills, fatigue, or  lassitude.  HEENT:   No headaches,  Difficulty swallowing,  Tooth/dental problems, or  Sore throat,                No sneezing, itching, ear ache, nasal congestion, post nasal drip,   CV:  No chest pain,  Orthopnea, PND, swelling in lower extremities, anasarca, dizziness, palpitations, syncope.   GI  No heartburn, indigestion, abdominal  pain, nausea, vomiting, diarrhea, change in bowel habits, loss of appetite, bloody stools.   Resp: No shortness of breath with exertion or at rest.  No excess mucus, no productive cough,  No non-productive cough,  No coughing up of blood.  No change in color of mucus.  No wheezing.  No chest wall deformity  Skin: no rash or lesions.  GU: no dysuria, change in color of urine, no urgency or frequency.  No flank pain, no hematuria   MS:  No joint pain or swelling.  No decreased range of motion.  No back pain.    Physical Exam  BP 116/76 (BP Location: Left Arm, Cuff Size: Large)   Pulse 81   Ht 5\' 5"  (1.651 m)   Wt  271 lb 12.8 oz (123.3 kg)   SpO2 97%   BMI 45.23 kg/m   GEN: A/Ox3; pleasant , NAD, obese   HEENT:  [place]/AT,  EACs-clear, TMs-wnl, NOSE-clear, THROAT-clear, no lesions, no postnasal drip or exudate noted.  Class II-III MP airway   NECK:  Supple w/ fair ROM; no JVD; normal carotid impulses w/o bruits; no thyromegaly or nodules palpated; no lymphadenopathy.    RESP  Clear  P & A; w/o, wheezes/ rales/ or rhonchi. no accessory muscle use, no dullness to percussion  CARD:  RRR, no m/r/g, no peripheral edema, pulses intact, no cyanosis or clubbing.  GI:   Soft & nt; nml bowel sounds; no organomegaly or masses detected.   Musco: Warm bil, no deformities or joint swelling noted.   Neuro: alert, no focal deficits noted.    Skin: Warm, no lesions or rashes    Lab Results:    BNP No results found for: BNP  ProBNP No results found for: PROBNP  Imaging: No results found.   Assessment & Plan:   OSA (obstructive sleep apnea) Significant improvement on CPAP  Plan  Patient Instructions  Keep up the good work Continue on CPAP at bedtime. Wear for at least 4-6 hours Work on healthy weight Do not drive sleeping Follow-up in 4-6 months with Dr. Elsworth Soho and as needed      Severe obesity (BMI >= 40) Weight loss     Rexene Edison, NP 05/03/2017

## 2017-05-03 NOTE — Patient Instructions (Signed)
Keep up the good work Continue on CPAP at bedtime. Wear for at least 4-6 hours Work on healthy weight Do not drive sleeping Follow-up in 4-6 months with Dr. Elsworth Soho and as needed

## 2017-05-03 NOTE — Assessment & Plan Note (Signed)
Significant improvement on CPAP  Plan  Patient Instructions  Keep up the good work Continue on CPAP at bedtime. Wear for at least 4-6 hours Work on healthy weight Do not drive sleeping Follow-up in 4-6 months with Dr. Elsworth Soho and as needed

## 2017-05-03 NOTE — Assessment & Plan Note (Signed)
-   Weight loss 

## 2017-05-06 DIAGNOSIS — M7662 Achilles tendinitis, left leg: Secondary | ICD-10-CM | POA: Diagnosis not present

## 2017-05-06 DIAGNOSIS — M79672 Pain in left foot: Secondary | ICD-10-CM | POA: Diagnosis not present

## 2017-05-08 DIAGNOSIS — M7662 Achilles tendinitis, left leg: Secondary | ICD-10-CM | POA: Diagnosis not present

## 2017-05-08 DIAGNOSIS — M79672 Pain in left foot: Secondary | ICD-10-CM | POA: Diagnosis not present

## 2017-05-13 ENCOUNTER — Encounter: Payer: BLUE CROSS/BLUE SHIELD | Admitting: Skilled Nursing Facility1

## 2017-05-13 DIAGNOSIS — Z713 Dietary counseling and surveillance: Secondary | ICD-10-CM | POA: Diagnosis not present

## 2017-05-14 DIAGNOSIS — M79672 Pain in left foot: Secondary | ICD-10-CM | POA: Diagnosis not present

## 2017-05-14 DIAGNOSIS — M7662 Achilles tendinitis, left leg: Secondary | ICD-10-CM | POA: Diagnosis not present

## 2017-05-15 ENCOUNTER — Encounter: Payer: Self-pay | Admitting: Skilled Nursing Facility1

## 2017-05-15 NOTE — Progress Notes (Signed)
Pre-Operative Nutrition Class:  Appt start time: 6720   End time:  1830.  Patient was seen on 05/15/2017 for Pre-Operative Bariatric Surgery Education at [facility].   Surgery date:  Surgery type: sleeve Start weight at [facility]: 273.4 Weight today: 271  Samples given per MNT protocol. Patient educated on appropriate usage: Bariatric Advantage Multivitamin Lot # [id number] Exp: 07/19  Bariatric Advantage Calcium  Lot # [id number] Exp: Dec 29 2017  Unjury Protein Powder   Lot # [id number] Exp: 03/2019  The following the learning objectives were met by the patient during this course:  Identify Pre-Op Dietary Goals and will begin 2 weeks pre-operatively  Identify appropriate sources of fluids and proteins   State protein recommendations and appropriate sources pre and post-operatively  Identify Post-Operative Dietary Goals and will follow for 2 weeks post-operatively  Identify appropriate multivitamin and calcium sources  Describe the need for physical activity post-operatively and will follow MD recommendations  State when to call healthcare provider regarding medication questions or post-operative complications  Handouts given during class include:  Pre-Op Bariatric Surgery Diet Handout  Protein Shake Handout  Post-Op Bariatric Surgery Nutrition Handout  BELT Program Information Flyer  Support Group Information Flyer  [facility] Bariatric Supplements Price List  Follow-Up Plan: Patient will follow-up at [facility] 2 weeks post operatively for diet advancement per MD.

## 2017-05-20 ENCOUNTER — Ambulatory Visit: Payer: BLUE CROSS/BLUE SHIELD | Admitting: Adult Health

## 2017-05-21 ENCOUNTER — Telehealth: Payer: Self-pay | Admitting: *Deleted

## 2017-05-21 NOTE — Telephone Encounter (Signed)
Patient went to seminar for wt loss surgery and they said that she would need a letter from provider.  Advised that she check her packet to see what needs to be in the letter.  She will call back or drop off paper.

## 2017-05-21 NOTE — Telephone Encounter (Signed)
Copied from [email or web address]. Topic: Inquiry >> May 14, 2017  8:34 AM Aurelio Brash B wrote: Reason for CRM: Pt wants to know if letter for approval for weight loss surgery should be picked up at office or will it be mailed? >> May 14, 2017 11:52 AM Catalina Lunger Massie Kluver, CMA wrote: I have not seen this paperwork; must have been given to provider during office visit, sorry. Thanks, [place]

## 2017-05-22 ENCOUNTER — Telehealth: Payer: Self-pay | Admitting: Family Medicine

## 2017-05-22 NOTE — Telephone Encounter (Signed)
Patient dropped off Weight Loss Paper work. Paper work placed in [place].

## 2017-05-24 NOTE — Telephone Encounter (Signed)
Patient stated that [facility] says that she was ready for surgery and has done everything.  She just waiting on a call back to schedule her surgery.  She will call us when she schedule her surgery and let us know if we need to fill out the paperwork.  Paperwork is on [employer].

## 2017-05-24 NOTE — Telephone Encounter (Signed)
Paperwork requires 81-months of medically supervised weight loss [total of 7 visits concurrent] and notes state that these forms are for the "doctor supervising the weight loss program; I am forwarding to [place] to discuss with provider, as the only consecutive visits were with the [facility]/01

## 2017-05-27 DIAGNOSIS — G4733 Obstructive sleep apnea (adult) (pediatric): Secondary | ICD-10-CM | POA: Diagnosis not present

## 2017-05-29 ENCOUNTER — Other Ambulatory Visit: Payer: Self-pay | Admitting: Family Medicine

## 2017-05-29 DIAGNOSIS — M7662 Achilles tendinitis, left leg: Secondary | ICD-10-CM | POA: Diagnosis not present

## 2017-05-29 DIAGNOSIS — M79672 Pain in left foot: Secondary | ICD-10-CM | POA: Diagnosis not present

## 2017-06-04 DIAGNOSIS — M79672 Pain in left foot: Secondary | ICD-10-CM | POA: Diagnosis not present

## 2017-06-04 DIAGNOSIS — M7662 Achilles tendinitis, left leg: Secondary | ICD-10-CM | POA: Diagnosis not present

## 2017-06-12 DIAGNOSIS — M7662 Achilles tendinitis, left leg: Secondary | ICD-10-CM | POA: Diagnosis not present

## 2017-06-13 ENCOUNTER — Encounter: Payer: Self-pay | Admitting: Family Medicine

## 2017-06-13 ENCOUNTER — Other Ambulatory Visit (HOSPITAL_COMMUNITY)
Admission: RE | Admit: 2017-06-13 | Discharge: 2017-06-13 | Disposition: A | Discharge status: Home / Self Care | Payer: BLUE CROSS/BLUE SHIELD | Source: Ambulatory Visit | Attending: Family Medicine | Admitting: Family Medicine

## 2017-06-13 ENCOUNTER — Ambulatory Visit: Payer: BLUE CROSS/BLUE SHIELD | Admitting: Family Medicine

## 2017-06-13 VITALS — BP 126/80 | HR 99 | Temp 98.8°F | Resp 16 | Ht 65.0 in | Wt 269.6 lb

## 2017-06-13 DIAGNOSIS — N898 Other specified noninflammatory disorders of vagina: Secondary | ICD-10-CM | POA: Insufficient documentation

## 2017-06-13 DIAGNOSIS — N939 Abnormal uterine and vaginal bleeding, unspecified: Secondary | ICD-10-CM

## 2017-06-13 DIAGNOSIS — B9689 Other specified bacterial agents as the cause of diseases classified elsewhere: Secondary | ICD-10-CM | POA: Diagnosis not present

## 2017-06-13 DIAGNOSIS — B373 Candidiasis of vulva and vagina: Secondary | ICD-10-CM | POA: Diagnosis not present

## 2017-06-13 MED ORDER — FLUCONAZOLE 150 MG PO TABS: 150.0000 mg | ORAL_TABLET | Freq: Once | ORAL | 0 refills | Status: AC

## 2017-06-13 MED ORDER — METRONIDAZOLE 500 MG PO TABS: 500.0000 mg | ORAL_TABLET | Freq: Two times a day (BID) | ORAL | 0 refills | Status: DC

## 2017-06-13 NOTE — Progress Notes (Signed)
Patient ID: Makayla Good, female   DOB: 17-Sep-1970, 47 y.o.   MRN: [id number]    Subjective:  I acted as a Education administrator for Dr. Carollee Herter.  Guerry Bruin, [place]   Patient ID: Makayla Good, female    DOB: May 03, 1970, 47 y.o.   MRN: [id number]  Chief Complaint  Patient presents with  . Vaginal Discharge  . cycle changes    HPI  Patient is in today for vaginal discharge and menstrual cycle changes.    + vag odor-- d/c thick and thin No abd pain Pt also c/o irregular vag bleedning--- hx Polyps in past in cervix ?    Patient Care Team: Carollee Herter, Alferd Apa, DO as PCP - General   Past Medical History:  Diagnosis Date  . Asthma   . Eczema   . H/O oophorectomy   . Hyperlipidemia   . Hypertension   . Obesity   . Polycystic ovary    Left    Past Surgical History:  Procedure Laterality Date  . CESAREAN SECTION  02/01/2005  . TUBAL LIGATION  02/01/2005    Family History  Problem Relation Age of Onset  . Hypertension Mother   . Schizophrenia Mother   . Dementia Mother   . Mental illness Mother        schizophrenia, dementia  . Hypertension Father   . Coronary artery disease Father        Stent  . Hypertension Sister   . Hypertension Sister   . Arthritis Sister        rheumatoid  . Dementia Maternal Aunt   . Kidney disease Unknown     Social History   Socioeconomic History  . Marital status: Married    Spouse name: Not on file  . Number of children: Not on file  . Years of education: 16  . Highest education level: Not on file  Social Needs  . Financial resource strain: Not on file  . Food insecurity - worry: Sometimes true  . Food insecurity - inability: Never true  . Transportation needs - medical: Not on file  . Transportation needs - non-medical: Not on file  Occupational History  . Occupation: release Horticulturist, commercial: [employer]  Tobacco Use  . Smoking status: Former Smoker    Packs/day: 0.50    Years: 16.00    Pack years: 8.00    Types:  Cigarettes    Start date: 03/23/1993    Last attempt to quit: 03/01/2017    Years since quitting: 0.2  . Smokeless tobacco: Never Used  . Tobacco comment: smokeless cigarettes  Substance and Sexual Activity  . Alcohol use: Yes  . Drug use: No  . Sexual activity: Yes    Partners: Male    Comment: been with same person over 20 years  Other Topics Concern  . Not on file  Social History Narrative   Exercise--gym, every other day--- at least 3 days a week    Outpatient Medications Prior to Visit  Medication Sig Dispense Refill  . albuterol (PROVENTIL HFA;VENTOLIN HFA) 108 (90 Base) MCG/ACT inhaler Inhale 2 puffs into the lungs every 6 (six) hours as needed for wheezing or shortness of breath. 1 Inhaler 2  . amLODipine (NORVASC) 10 MG tablet take 1 tablet by mouth once daily 90 tablet 1  . hydrochlorothiazide (HYDRODIURIL) 25 MG tablet Take 1 tablet (25 mg total) by mouth daily. 90 tablet 0  . meloxicam (MOBIC) 15 MG tablet Take 15 mg by  mouth daily.    . metoprolol succinate (TOPROL-XL) 25 MG 24 hr tablet take 1 tablet by mouth once daily 90 tablet 1  . Multiple Vitamin (MULTIVITAMIN) capsule Take 1 capsule by mouth daily.    . nicotine (NICODERM CQ - DOSED IN MG/24 HOURS) 21 mg/24hr patch PLACE 1 PATCH ONTO THE SKIN DAILY 28 patch 0  . Omega-3 Fatty Acids (FISH OIL) 1000 MG CAPS Take by mouth.    . pantoprazole (PROTONIX) 40 MG tablet take 1 tablet by mouth once daily 90 tablet 1  . potassium chloride SA (K-DUR,KLOR-CON) 20 MEQ tablet Take 1 tablet (20 mEq total) by mouth daily. 90 tablet 0  . vitamin B-12 (CYANOCOBALAMIN) 100 MCG tablet Take 100 mcg by mouth daily.     No facility-administered medications prior to visit.     No Known Allergies  Review of Systems  Constitutional: Negative for fever and malaise/fatigue.  HENT: Negative for congestion.   Eyes: Negative for blurred vision.  Respiratory: Negative for cough and shortness of breath.   Cardiovascular: Negative for chest  pain, palpitations and leg swelling.  Gastrointestinal: Negative for vomiting.  Musculoskeletal: Negative for back pain.  Skin: Negative for rash.  Neurological: Negative for loss of consciousness and headaches.       Objective:    Physical Exam  Constitutional: She is oriented to person, place, and time. She appears well-developed and well-nourished.  HENT:  Head: Normocephalic and atraumatic.  Eyes: Conjunctivae and EOM are normal.  Neck: Normal range of motion. Neck supple. No JVD present. Carotid bruit is not present. No thyromegaly present.  Cardiovascular: Normal rate, regular rhythm and normal heart sounds.  No murmur heard. Pulmonary/Chest: Effort normal and breath sounds normal. No respiratory distress. She has no wheezes. She has no rales. She exhibits no tenderness.  Genitourinary: Uterus normal. Cervix exhibits discharge. Cervix exhibits no motion tenderness and no friability. No erythema, tenderness or bleeding in the vagina. No foreign body in the vagina. Vaginal discharge found.  Musculoskeletal: She exhibits no edema.  Neurological: She is alert and oriented to person, place, and time.  Psychiatric: She has a normal mood and affect.    BP 126/80 (BP Location: Right Arm, Cuff Size: Normal)   Pulse 99   Temp 98.8 F (37.1 C) (Oral)   Resp 16   Ht 5\' 5"  (1.651 m)   Wt 269 lb 9.6 oz (122.3 kg)   LMP 05/09/2017   SpO2 98%   BMI 44.86 kg/m  Wt Readings from Last 3 Encounters:  06/13/17 269 lb 9.6 oz (122.3 kg)  05/15/17 271 lb (122.9 kg)  05/03/17 271 lb 12.8 oz (123.3 kg)   BP Readings from Last 3 Encounters:  06/13/17 126/80  05/03/17 116/76  03/05/17 122/78     Immunization History  Administered Date(s) Administered  . Influenza Nasal 01/21/2012  . Influenza, Seasonal, Injecte, Preservative Fre 01/31/2015  . Influenza,inj,Quad PF,6+ Mos 02/11/2013  . Influenza-Unspecified 01/02/2016, 12/28/2016  . Pneumococcal Polysaccharide-23 05/14/2011  . Tdap  09/04/2010    Health Maintenance  Topic Date Due  . MAMMOGRAM  11/23/2017  . PAP SMEAR  01/23/2019  . TETANUS/TDAP  09/03/2021  . INFLUENZA VACCINE  Completed  . HIV Screening  Completed    Lab Results  Component Value Date   WBC 7.9 02/07/2017   HGB 13.0 02/07/2017   HCT 39.9 02/07/2017   PLT 262.0 02/07/2017   GLUCOSE 86 02/07/2017   CHOL 155 02/07/2017   TRIG 96.0 02/07/2017   HDL  38.50 (L) 02/07/2017   LDLCALC 97 02/07/2017   ALT 22 02/07/2017   AST 23 02/07/2017   NA 138 02/07/2017   K 4.1 02/07/2017   CL 99 02/07/2017   CREATININE 0.70 02/07/2017   BUN 10 02/07/2017   CO2 29 02/07/2017   TSH 2.03 02/07/2017   HGBA1C 6.5 07/01/2015   MICROALBUR <0.7 01/23/2016    Lab Results  Component Value Date   TSH 2.03 02/07/2017   Lab Results  Component Value Date   WBC 7.9 02/07/2017   HGB 13.0 02/07/2017   HCT 39.9 02/07/2017   MCV 94.9 02/07/2017   PLT 262.0 02/07/2017   Lab Results  Component Value Date   NA 138 02/07/2017   K 4.1 02/07/2017   CO2 29 02/07/2017   GLUCOSE 86 02/07/2017   BUN 10 02/07/2017   CREATININE 0.70 02/07/2017   BILITOT 0.5 02/07/2017   ALKPHOS 68 02/07/2017   AST 23 02/07/2017   ALT 22 02/07/2017   PROT 7.7 02/07/2017   ALBUMIN 4.1 02/07/2017   CALCIUM 9.8 02/07/2017   GFR 115.73 02/07/2017   Lab Results  Component Value Date   CHOL 155 02/07/2017   Lab Results  Component Value Date   HDL 38.50 (L) 02/07/2017   Lab Results  Component Value Date   LDLCALC 97 02/07/2017   Lab Results  Component Value Date   TRIG 96.0 02/07/2017   Lab Results  Component Value Date   CHOLHDL 4 02/07/2017   Lab Results  Component Value Date   HGBA1C 6.5 07/01/2015         Assessment & Plan:   Problem List Items Addressed This Visit    None    Visit Diagnoses    Vaginal discharge    -  Primary   Relevant Medications   fluconazole (DIFLUCAN) 150 MG tablet   metroNIDAZOLE (FLAGYL) 500 MG tablet   Other Relevant Orders     Cervicovaginal ancillary only   HIV antibody   HSV Type I/II IgG, IgMw/ reflex   RPR   Vaginal bleeding, abnormal       Relevant Orders   US PELVIS (TRANSABDOMINAL ONLY)   US PELVIS TRANSVANGINAL NON-OB (TV ONLY)    I am having Naveh R. Babson start on fluconazole and metroNIDAZOLE. I am also having her maintain her multivitamin, albuterol, Fish Oil, vitamin B-12, metoprolol succinate, pantoprazole, meloxicam, potassium chloride SA, hydrochlorothiazide, amLODipine, and nicotine.  Meds ordered this encounter  Medications  . fluconazole (DIFLUCAN) 150 MG tablet    Sig: Take 1 tablet (150 mg total) by mouth once for 1 dose.    Dispense:  1 tablet    Refill:  0  . metroNIDAZOLE (FLAGYL) 500 MG tablet    Sig: Take 1 tablet (500 mg total) by mouth 2 (two) times daily.    Dispense:  21 tablet    Refill:  0    CMA served as scribe during this visit. History, Physical and Plan performed by medical provider. Documentation and orders reviewed and attested to.  [name], DO

## 2017-06-13 NOTE — Patient Instructions (Signed)

## 2017-06-14 LAB — RPR: RPR Ser Ql: NONREACTIVE

## 2017-06-14 LAB — HSV TYPE I/II IGG, IGMW/ REFLEX
HSV 1 Glycoprotein G Ab, IgG: 28 index — ABNORMAL HIGH (ref 0.00–0.90)
HSV 1 IgM: <1:10 {titer}
HSV 2 IgG, Type Spec: <0.91 index (ref 0.00–0.90)
HSV 2 IgM: <1:10 {titer}

## 2017-06-14 LAB — HIV ANTIBODY (ROUTINE TESTING W REFLEX): HIV 1&2 Ab, 4th Generation: NONREACTIVE

## 2017-06-17 ENCOUNTER — Ambulatory Visit (HOSPITAL_BASED_OUTPATIENT_CLINIC_OR_DEPARTMENT_OTHER): Payer: BLUE CROSS/BLUE SHIELD

## 2017-06-17 ENCOUNTER — Telehealth: Payer: Self-pay | Admitting: *Deleted

## 2017-06-17 ENCOUNTER — Ambulatory Visit (HOSPITAL_BASED_OUTPATIENT_CLINIC_OR_DEPARTMENT_OTHER)
Admission: RE | Admit: 2017-06-17 | Discharge: 2017-06-17 | Disposition: A | Discharge status: Home / Self Care | Payer: BLUE CROSS/BLUE SHIELD | Source: Ambulatory Visit | Attending: Family Medicine | Admitting: Family Medicine

## 2017-06-17 DIAGNOSIS — D259 Leiomyoma of uterus, unspecified: Secondary | ICD-10-CM | POA: Diagnosis not present

## 2017-06-17 DIAGNOSIS — N939 Abnormal uterine and vaginal bleeding, unspecified: Secondary | ICD-10-CM | POA: Diagnosis not present

## 2017-06-17 LAB — CERVICOVAGINAL ANCILLARY ONLY
Bacterial vaginitis: POSITIVE — AB
Candida vaginitis: NEGATIVE
Chlamydia: NEGATIVE
Neisseria Gonorrhea: NEGATIVE
Trichomonas: POSITIVE — AB

## 2017-06-17 NOTE — Telephone Encounter (Signed)
Received Lab Report results from [facility]; forwarded to provider/SLS 02/25

## 2017-06-19 ENCOUNTER — Other Ambulatory Visit: Payer: Self-pay | Admitting: *Deleted

## 2017-06-19 DIAGNOSIS — D219 Benign neoplasm of connective and other soft tissue, unspecified: Secondary | ICD-10-CM

## 2017-06-24 DIAGNOSIS — G4733 Obstructive sleep apnea (adult) (pediatric): Secondary | ICD-10-CM | POA: Diagnosis not present

## 2017-06-27 ENCOUNTER — Ambulatory Visit: Payer: Self-pay | Admitting: Surgery

## 2017-06-27 DIAGNOSIS — I10 Essential (primary) hypertension: Secondary | ICD-10-CM | POA: Diagnosis not present

## 2017-06-27 DIAGNOSIS — M1990 Unspecified osteoarthritis, unspecified site: Secondary | ICD-10-CM | POA: Diagnosis not present

## 2017-06-27 NOTE — H&P (Signed)
Makayla Good Documented: 06/27/2017 10:45 AM Location: [facility] Patient #: [id number] DOB: 18-Jun-1970 Married / Language: Makayla Good / Race: White Female  History of Present Illness (Aldahir Litaker A. Kae Heller MD; 06/27/2017 11:10 AM) Patient words: Here for preoperative visit. She is really looking forward to moving her with surgery and has been given a tentative date of April 1. She has completed the preoperative pathway. She was deemed an appropriate candidate by the psychologist as well as nutritionist. Her preoperative labs showed a borderline low iron level and a hemoglobin A1c of 6.2 but otherwise unremarkable. Her chest x-ray showed mild cardiomegaly and chronic pulmonary interstitial prominence, upper GI was normal and did not show hiatal hernia. She has a few questions to go over today.  Initial visit: "Makayla Good'" present to discuss bariatric surgery. She is been struggling with her obesity and her entire life. She states the last time she was below 200 pounds she was in high school. She has tried various methods of weight loss including over-the-counter appetite suppressants, phentermine, Weight Watchers etc. She states that she will try something, feel it is not working fast enough, and switch to something else. She'll lose a little bit of weight but then regain it all. She tries to stay active, however she has significant bilateral knee pain and this is limiting to her in terms of exercise regimens. She tries to walk on a daily basis. She has history of mild reflux, she takes PPIs as needed, and this is almost on a daily basis. She is a daily smoker, half pack per day. She was able to quit for about 6 months but then resumed. This was secondary to life stressors. She does not have diabetes presently.  She denies any history of abdominal surgery.  The patient is a 47 year old female who presents for a bariatric surgery evaluation. Associated symptoms include depressed  mood, poor self esteem, lost range of motion, joint pains and heartburn. Initial onset of obesity was in childhood. Initial presentation included inability to lose weight. Disease complications include hypertension and osteoarthritis. Current diet includes well balanced meals. less than once per week (She is limited by joint pain) and aerobic. The patient is currently able to do activities of daily living without limitations, able to work without limitations, able to do housework without limitations and unable to participate in sports. Past treatment has included appetite suppressants and weight loss group. Cardiac history: The patient has smoking (She smokes half a pack per day). Gastrointestinal History: Patient has heartburn (Her symptoms are completely controlled with ppi prn). MBSQIP recognized comorbidities: Patient has hypertension and gastroesophageal reflux disease.  The patient is a 47 year old female.   Allergies (Tanisha A. Owens Shark, [place]; 06/27/2017 10:45 AM) No Known Drug Allergies [11/02/2016]: Allergies Reconciled  Medication History (Tanisha A. Owens Shark, RMA; 06/27/2017 10:45 AM) Potassium Chloride Crys ER (20MEQ Tablet ER, Oral) Active. Pantoprazole Sodium (40MG  Tablet DR, Oral) Active. Metoprolol Succinate ER (25MG  Tablet ER 24HR, Oral) Active. HydroCHLOROthiazide (25MG  Tablet, Oral) Active. AmLODIPine Besylate (10MG  Tablet, Oral) Active. Vitamin B-12 (1000MCG Tablet, 1 (one) Oral) Active. Multiple Vitamin (1 (one) Oral) Active. Fish Oil Concentrate (435MG  Capsule, 1 (one) Oral) Active. Medications Reconciled    Vitals (Tanisha A. Brown RMA; 06/27/2017 10:45 AM) 06/27/2017 10:45 AM Weight: 217.6 lb Height: 65in Body Surface Area: 2.05 m Body Mass Index: 36.21 kg/m  Temp.: 98.25F  Pulse: 76 (Regular)  BP: 128/86 (Sitting, Left Arm, Standard)      Physical Exam (Dewan Emond A. Kae Heller  MD; 06/27/2017 11:11 AM)  The physical exam findings are as  follows: Note:She is alert and well-appearing Extraocular motions intact, no scleral icterus Moist mucous membranes, dentition intact Unlabored respirations, symmetrical air entry Regular rate and rhythm, mild bilateral LE nonpitting edema Abdomen is obese, nontender nondistended. No palpable mass or hernia. Well-healed Pfannenstiel scar. Extremities warm without deformity Neuro grossly normal, normal gait Psych mood and affect, appropriate insight Skin is warm and dry    Assessment & Plan (Michaline Kindig A. Kae Heller MD; 06/27/2017 11:12 AM)  MORBID OBESITY (E66.01) Story: She has completed the preoperative pathway without any barriers identified. She remains an excellent candidate for weight loss surgery. We discussed the typical perioperative and postoperative course and again went over the surgical technique and risks involved. Questions were welcomed and answered.  Given her smoking history I advised her that sleeve and probably the best option. We discussed that surgery in detail. I described the risks of surgery to her including bleeding, pain, scarring, intra-abdominal injury, staple line leak, hernia, chronic abdominal pain or nausea, weight regain, as well as the risks of general anesthesia including heart attack, blood clots, stroke, pneumonia and death. We discussed the typical preoperative, perioperative and postoperative pathway. She had several questions all of which were answered to her satisfaction. I emphasized to her that the nutritional and psychological evaluation and consultation are some of the most important pieces of her overall chances of success with sustained weight loss. We'll get her started on the pathway.   HYPERTENSION (I10)   OSTEOARTHRITIS (M19.90)

## 2017-07-23 ENCOUNTER — Encounter (HOSPITAL_COMMUNITY): Payer: Self-pay | Admitting: *Deleted

## 2017-07-24 NOTE — Progress Notes (Signed)
02-07-17 (Epic) EKG  11-28-16 (Epic) CXR

## 2017-07-24 NOTE — Patient Instructions (Addendum)
MACKENZIE GROOM  07/24/2017   Your procedure is scheduled on: 07-29-17   Report to [facility] Main  Entrance    ARRIVE AT 69 AM. Have a seat in the Main Lobby. Please note there is a phone at the [employer]. Please call [phone] on that phone. Someone from Short Stay will come and get you from the Main Lobby and take you to Short Stay.    Call this number if you have problems the morning of surgery [phone]     Take these medicines the morning of surgery with A SIP OF WATER: Amlodipine (Norvasc), Metoprolol Succinate (Toprol-XL), and Pantoprazole (Protonix). You may also bring and use your inhaler as needed.                                You may not have any metal on your body including hair pins and              piercings  Do not wear jewelry, make-up, lotions, powders or perfumes, deodorant             Do not wear nail polish.  Do not shave  48 hours prior to surgery.               Do not bring valuables to the hospital. [place].  Contacts, dentures or bridgework may not be worn into surgery.  Leave suitcase in the car. After surgery it may be brought to your room.   Please bring your mask and tubing for your CPAP machine               Please read over the following fact sheets you were given: _____________________________________________________________________             MORNING OF SURGERY DRINK:  [place], DRINK ALL OF THE SHAKE AT ONE TIME.   NO SOLID FOOD AFTER 600 PM THE NIGHT BEFORE YOUR SURGERY. YOU MAY DRINK CLEAR FLUIDS. THE SHAKE YOU DRINK BEFORE YOU LEAVE HOME WILL BE THE LAST FLUIDS YOU DRINK BEFORE SURGERY.  PAIN IS EXPECTED AFTER SURGERY AND WILL NOT BE COMPLETELY ELIMINATED. AMBULATION AND TYLENOL WILL HELP REDUCE INCISIONAL AND GAS PAIN. MOVEMENT IS KEY!  YOU ARE EXPECTED TO BE OUT OF BED WITHIN 4 HOURS OF ADMISSION TO YOUR PATIENT  ROOM.  SITTING IN THE RECLINER THROUGHOUT THE DAY IS IMPORTANT FOR DRINKING FLUIDS AND MOVING GAS THROUGHOUT THE GI TRACT.  COMPRESSION STOCKINGS SHOULD BE WORN [place] UNLESS YOU ARE WALKING.   INCENTIVE SPIROMETER SHOULD BE USED EVERY HOUR WHILE AWAKE TO DECREASE POST-OPERATIVE COMPLICATIONS SUCH AS PNEUMONIA.  WHEN DISCHARGED HOME, IT IS IMPORTANT TO CONTINUE TO WALK EVERY HOUR AND USE THE INCENTIVE SPIROMETER EVERY HOUR.      CLEAR LIQUID DIET   Foods Allowed                                                                     Foods Excluded  Coffee and tea, regular and decaf                             liquids that you cannot  Plain Jell-O in any flavor                                             see through such as: Fruit ices (not with fruit pulp)                                     milk, soups, orange juice  Iced Popsicles                                    All solid food Carbonated beverages, regular and diet                                    Cranberry, grape and apple juices Sports drinks like Gatorade Lightly seasoned clear broth or consume(fat free) Sugar, honey syrup  Sample Menu Breakfast                                Lunch                                     Supper Cranberry juice                    Beef broth                            Chicken broth Jell-O                                     Grape juice                           Apple juice Coffee or tea                        Jell-O                                      Popsicle                                                Coffee or tea                        Coffee or tea  _____________________________________________________________________       [facility] - Preparing for Surgery Before surgery, you can play an important role.  Because skin is not sterile, your  skin needs to be as free of germs as possible.  You can reduce the number of germs on your skin by washing with CHG  (chlorahexidine gluconate) soap before surgery.  CHG is an antiseptic cleaner which kills germs and bonds with the skin to continue killing germs even after washing. Please DO NOT use if you have an allergy to CHG or antibacterial soaps.  If your skin becomes reddened/irritated stop using the CHG and inform your nurse when you arrive at Short Stay. Do not shave (including legs and underarms) for at least 48 hours prior to the first CHG shower.  You may shave your face/neck. Please follow these instructions carefully:  1.  Shower with CHG Soap the night before surgery and the  morning of Surgery.  2.  If you choose to wash your hair, wash your hair first as usual with your  normal  shampoo.  3.  After you shampoo, rinse your hair and body thoroughly to remove the  shampoo.                           4.  Use CHG as you would any other liquid soap.  You can apply chg directly  to the skin and wash                       Gently with a scrungie or clean washcloth.  5.  Apply the CHG Soap to your body ONLY FROM THE NECK DOWN.   Do not use on face/ open                           Wound or open sores. Avoid contact with eyes, ears mouth and genitals (private parts).                       Wash face,  Genitals (private parts) with your normal soap.             6.  Wash thoroughly, paying special attention to the area where your surgery  will be performed.  7.  Thoroughly rinse your body with warm water from the neck down.  8.  DO NOT shower/wash with your normal soap after using and rinsing off  the CHG Soap.                9.  Pat yourself dry with a clean towel.            10.  Wear clean pajamas.            11.  Place clean sheets on your bed the night of your first shower and do not  sleep with pets. Day of Surgery : Do not apply any lotions/deodorants the morning of surgery.  Please wear clean clothes to the hospital/surgery center.  FAILURE TO FOLLOW THESE INSTRUCTIONS MAY RESULT IN THE CANCELLATION OF  YOUR SURGERY PATIENT SIGNATURE_________________________________  NURSE SIGNATURE__________________________________  ________________________________________________________________________  WHAT IS A BLOOD TRANSFUSION? Blood Transfusion Information  A transfusion is the replacement of blood or some of its parts. Blood is made up of multiple cells which provide different functions.  Red blood cells carry oxygen and are used for blood loss replacement.  White blood cells fight against infection.  Platelets control bleeding.  Plasma helps clot blood.  Other blood products are available for specialized needs, such as hemophilia or other clotting disorders.  BEFORE THE TRANSFUSION  Who gives blood for transfusions?   Healthy volunteers who are fully evaluated to make sure their blood is safe. This is blood bank blood. Transfusion therapy is the safest it has ever been in the practice of medicine. Before blood is taken from a donor, a complete history is taken to make sure that person has no history of diseases nor engages in risky social behavior (examples are intravenous drug use or sexual activity with multiple partners). The donor's travel history is screened to minimize risk of transmitting infections, such as malaria. The donated blood is tested for signs of infectious diseases, such as HIV and hepatitis. The blood is then tested to be sure it is compatible with you in order to minimize the chance of a transfusion reaction. If you or a relative donates blood, this is often done in anticipation of surgery and is not appropriate for emergency situations. It takes many days to process the donated blood. RISKS AND COMPLICATIONS Although transfusion therapy is very safe and saves many lives, the main dangers of transfusion include:   Getting an infectious disease.  Developing a transfusion reaction. This is an allergic reaction to something in the blood you were given. Every precaution is  taken to prevent this. The decision to have a blood transfusion has been considered carefully by your caregiver before blood is given. Blood is not given unless the benefits outweigh the risks. AFTER THE TRANSFUSION  Right after receiving a blood transfusion, you will usually feel much better and more energetic. This is especially true if your red blood cells have gotten low (anemic). The transfusion raises the level of the red blood cells which carry oxygen, and this usually causes an energy increase.  The nurse administering the transfusion will monitor you carefully for complications. HOME CARE INSTRUCTIONS  No special instructions are needed after a transfusion. You may find your energy is better. Speak with your caregiver about any limitations on activity for underlying diseases you may have. SEEK MEDICAL CARE IF:   Your condition is not improving after your transfusion.  You develop redness or irritation at the intravenous (IV) site. SEEK IMMEDIATE MEDICAL CARE IF:  Any of the following symptoms occur over the next 12 hours:  Shaking chills.  You have a temperature by mouth above 102 F (38.9 C), not controlled by medicine.  Chest, back, or muscle pain.  People around you feel you are not acting correctly or are confused.  Shortness of breath or difficulty breathing.  Dizziness and fainting.  You get a rash or develop hives.  You have a decrease in urine output.  Your urine turns a dark color or changes to pink, red, or brown. Any of the following symptoms occur over the next 10 days:  You have a temperature by mouth above 102 F (38.9 C), not controlled by medicine.  Shortness of breath.  Weakness after normal activity.  The white part of the eye turns yellow (jaundice).  You have a decrease in the amount of urine or are urinating less often.  Your urine turns a dark color or changes to pink, red, or brown. Document Released: 04/06/2000 Document Revised:  07/02/2011 Document Reviewed: 11/24/2007 [facility] Patient Information 2014 [place], [place].  _______________________________________________________________________

## 2017-07-25 DIAGNOSIS — G4733 Obstructive sleep apnea (adult) (pediatric): Secondary | ICD-10-CM | POA: Diagnosis not present

## 2017-07-26 ENCOUNTER — Other Ambulatory Visit: Payer: Self-pay

## 2017-07-26 ENCOUNTER — Encounter (HOSPITAL_COMMUNITY)
Admission: RE | Admit: 2017-07-26 | Discharge: 2017-07-26 | Disposition: A | Discharge status: Home / Self Care | Payer: BLUE CROSS/BLUE SHIELD | Source: Ambulatory Visit | Attending: Surgery | Admitting: Surgery

## 2017-07-26 ENCOUNTER — Encounter (HOSPITAL_COMMUNITY): Payer: Self-pay

## 2017-07-26 ENCOUNTER — Other Ambulatory Visit (HOSPITAL_COMMUNITY): Payer: BLUE CROSS/BLUE SHIELD

## 2017-07-26 DIAGNOSIS — Z6841 Body Mass Index (BMI) 40.0 and over, adult: Secondary | ICD-10-CM | POA: Diagnosis not present

## 2017-07-26 DIAGNOSIS — E282 Polycystic ovarian syndrome: Secondary | ICD-10-CM | POA: Diagnosis not present

## 2017-07-26 DIAGNOSIS — G4733 Obstructive sleep apnea (adult) (pediatric): Secondary | ICD-10-CM | POA: Diagnosis not present

## 2017-07-26 DIAGNOSIS — Z79899 Other long term (current) drug therapy: Secondary | ICD-10-CM | POA: Diagnosis not present

## 2017-07-26 DIAGNOSIS — E876 Hypokalemia: Secondary | ICD-10-CM | POA: Diagnosis not present

## 2017-07-26 DIAGNOSIS — M170 Bilateral primary osteoarthritis of knee: Secondary | ICD-10-CM | POA: Diagnosis not present

## 2017-07-26 DIAGNOSIS — F17210 Nicotine dependence, cigarettes, uncomplicated: Secondary | ICD-10-CM | POA: Diagnosis not present

## 2017-07-26 DIAGNOSIS — I10 Essential (primary) hypertension: Secondary | ICD-10-CM | POA: Diagnosis not present

## 2017-07-26 DIAGNOSIS — J45909 Unspecified asthma, uncomplicated: Secondary | ICD-10-CM | POA: Diagnosis not present

## 2017-07-26 DIAGNOSIS — Z9989 Dependence on other enabling machines and devices: Secondary | ICD-10-CM | POA: Diagnosis not present

## 2017-07-26 DIAGNOSIS — G4730 Sleep apnea, unspecified: Secondary | ICD-10-CM | POA: Diagnosis not present

## 2017-07-26 DIAGNOSIS — E785 Hyperlipidemia, unspecified: Secondary | ICD-10-CM | POA: Diagnosis not present

## 2017-07-26 DIAGNOSIS — K219 Gastro-esophageal reflux disease without esophagitis: Secondary | ICD-10-CM | POA: Diagnosis not present

## 2017-07-26 HISTORY — DX: Sleep apnea, unspecified: G47.30

## 2017-07-26 LAB — CBC WITH DIFFERENTIAL/PLATELET
Basophils Absolute: 0 10*3/uL (ref 0.0–0.1)
Basophils Relative: 1 %
Eosinophils Absolute: 0.1 10*3/uL (ref 0.0–0.7)
Eosinophils Relative: 2 %
HCT: 36.5 % (ref 36.0–46.0)
Hemoglobin: 11.8 g/dL — ABNORMAL LOW (ref 12.0–15.0)
Lymphocytes Relative: 43 %
Lymphs Abs: 2.5 10*3/uL (ref 0.7–4.0)
MCH: 29.1 pg (ref 26.0–34.0)
MCHC: 32.3 g/dL (ref 30.0–36.0)
MCV: 89.9 fL (ref 78.0–100.0)
Monocytes Absolute: 0.6 10*3/uL (ref 0.1–1.0)
Monocytes Relative: 10 %
Neutro Abs: 2.5 10*3/uL (ref 1.7–7.7)
Neutrophils Relative %: 44 %
Platelets: 250 10*3/uL (ref 150–400)
RBC: 4.06 MIL/uL (ref 3.87–5.11)
RDW: 14.7 % (ref 11.5–15.5)
WBC: 5.8 10*3/uL (ref 4.0–10.5)

## 2017-07-26 LAB — COMPREHENSIVE METABOLIC PANEL
ALT: 24 U/L (ref 14–54)
AST: 23 U/L (ref 15–41)
Albumin: 3.8 g/dL (ref 3.5–5.0)
Alkaline Phosphatase: 63 U/L (ref 38–126)
Anion gap: 8 (ref 5–15)
BUN: 17 mg/dL (ref 6–20)
CO2: 26 mmol/L (ref 22–32)
Calcium: 9.3 mg/dL (ref 8.9–10.3)
Chloride: 106 mmol/L (ref 101–111)
Creatinine, Ser: 0.59 mg/dL (ref 0.44–1.00)
GFR calc Af Amer: >60 mL/min (ref >60)
GFR calc non Af Amer: >60 mL/min (ref >60)
Glucose, Bld: 103 mg/dL — ABNORMAL HIGH (ref 65–99)
Potassium: 3.7 mmol/L (ref 3.5–5.1)
Sodium: 140 mmol/L (ref 135–145)
Total Bilirubin: 0.6 mg/dL (ref 0.3–1.2)
Total Protein: 7.4 g/dL (ref 6.5–8.1)

## 2017-07-26 LAB — ABO/RH: ABO/RH(D): A NEG

## 2017-07-29 ENCOUNTER — Encounter (HOSPITAL_COMMUNITY)
Admission: RE | Disposition: A | Discharge status: Home / Self Care | Payer: Self-pay | Source: Ambulatory Visit | Attending: Surgery

## 2017-07-29 ENCOUNTER — Inpatient Hospital Stay (HOSPITAL_COMMUNITY): Admission: RE | Admit: 2017-07-29 | Payer: BLUE CROSS/BLUE SHIELD | Source: Ambulatory Visit | Admitting: Surgery

## 2017-07-29 ENCOUNTER — Encounter (HOSPITAL_COMMUNITY): Payer: Self-pay

## 2017-07-29 ENCOUNTER — Inpatient Hospital Stay (HOSPITAL_COMMUNITY): Payer: BLUE CROSS/BLUE SHIELD | Admitting: Certified Registered"

## 2017-07-29 ENCOUNTER — Inpatient Hospital Stay (HOSPITAL_COMMUNITY)
Admission: RE | Admit: 2017-07-29 | Discharge: 2017-07-30 | DRG: 621 | Disposition: A | Discharge status: Home / Self Care | Payer: BLUE CROSS/BLUE SHIELD | Source: Ambulatory Visit | Attending: Surgery | Admitting: Surgery

## 2017-07-29 ENCOUNTER — Other Ambulatory Visit: Payer: Self-pay

## 2017-07-29 DIAGNOSIS — J45909 Unspecified asthma, uncomplicated: Secondary | ICD-10-CM | POA: Diagnosis present

## 2017-07-29 DIAGNOSIS — K219 Gastro-esophageal reflux disease without esophagitis: Secondary | ICD-10-CM | POA: Diagnosis not present

## 2017-07-29 DIAGNOSIS — Z6841 Body Mass Index (BMI) 40.0 and over, adult: Secondary | ICD-10-CM | POA: Diagnosis not present

## 2017-07-29 DIAGNOSIS — Z79899 Other long term (current) drug therapy: Secondary | ICD-10-CM | POA: Diagnosis not present

## 2017-07-29 DIAGNOSIS — E785 Hyperlipidemia, unspecified: Secondary | ICD-10-CM | POA: Diagnosis present

## 2017-07-29 DIAGNOSIS — I10 Essential (primary) hypertension: Secondary | ICD-10-CM | POA: Diagnosis not present

## 2017-07-29 DIAGNOSIS — Z9989 Dependence on other enabling machines and devices: Secondary | ICD-10-CM | POA: Diagnosis not present

## 2017-07-29 DIAGNOSIS — E282 Polycystic ovarian syndrome: Secondary | ICD-10-CM | POA: Diagnosis present

## 2017-07-29 DIAGNOSIS — G4733 Obstructive sleep apnea (adult) (pediatric): Secondary | ICD-10-CM | POA: Diagnosis not present

## 2017-07-29 DIAGNOSIS — E876 Hypokalemia: Secondary | ICD-10-CM | POA: Diagnosis present

## 2017-07-29 DIAGNOSIS — F17210 Nicotine dependence, cigarettes, uncomplicated: Secondary | ICD-10-CM | POA: Diagnosis present

## 2017-07-29 DIAGNOSIS — M170 Bilateral primary osteoarthritis of knee: Secondary | ICD-10-CM | POA: Diagnosis present

## 2017-07-29 DIAGNOSIS — G4730 Sleep apnea, unspecified: Secondary | ICD-10-CM | POA: Diagnosis present

## 2017-07-29 HISTORY — PX: LAPAROSCOPIC GASTRIC SLEEVE RESECTION: SHX5895

## 2017-07-29 LAB — TYPE AND SCREEN
ABO/RH(D): A NEG
Antibody Screen: NEGATIVE

## 2017-07-29 LAB — PREGNANCY, URINE: Preg Test, Ur: NEGATIVE

## 2017-07-29 SURGERY — GASTRECTOMY, SLEEVE, LAPAROSCOPIC
Anesthesia: General | Site: Abdomen

## 2017-07-29 MED ORDER — PANTOPRAZOLE SODIUM 40 MG IV SOLR
40.0000 mg | Freq: Every day | INTRAVENOUS | Status: DC
  Administered 2017-07-29: 40 mg via INTRAVENOUS
  Filled 2017-07-29: qty 40

## 2017-07-29 MED ORDER — LACTATED RINGERS IV SOLN
INTRAVENOUS | Status: DC | PRN
  Administered 2017-07-29 (×2): via INTRAVENOUS

## 2017-07-29 MED ORDER — CELECOXIB 200 MG PO CAPS
ORAL_CAPSULE | ORAL | Status: AC
  Filled 2017-07-29: qty 2

## 2017-07-29 MED ORDER — SIMETHICONE 80 MG PO CHEW
80.0000 mg | CHEWABLE_TABLET | Freq: Four times a day (QID) | ORAL | Status: DC | PRN
  Administered 2017-07-29 (×2): 80 mg via ORAL
  Filled 2017-07-29 (×2): qty 1

## 2017-07-29 MED ORDER — LIDOCAINE 2% (20 MG/ML) 5 ML SYRINGE
INTRAMUSCULAR | Status: DC | PRN
  Administered 2017-07-29: 1.5 mg/kg/h via INTRAVENOUS

## 2017-07-29 MED ORDER — BUPIVACAINE LIPOSOME 1.3 % IJ SUSP
20.0000 mL | Freq: Once | INTRAMUSCULAR | Status: AC
  Administered 2017-07-29: 20 mL
  Filled 2017-07-29: qty 20

## 2017-07-29 MED ORDER — DEXAMETHASONE SODIUM PHOSPHATE 4 MG/ML IJ SOLN: 4.0000 mg | INTRAMUSCULAR | Status: DC

## 2017-07-29 MED ORDER — KETAMINE HCL 10 MG/ML IJ SOLN
INTRAMUSCULAR | Status: AC
  Filled 2017-07-29: qty 1

## 2017-07-29 MED ORDER — PREMIER PROTEIN SHAKE
2.0000 [oz_av] | ORAL | Status: DC
  Administered 2017-07-30 (×2): 2 [oz_av] via ORAL

## 2017-07-29 MED ORDER — SODIUM CHLORIDE 0.9 % IV SOLN
INTRAVENOUS | Status: DC
  Administered 2017-07-29 (×2): via INTRAVENOUS

## 2017-07-29 MED ORDER — ROCURONIUM BROMIDE 10 MG/ML (PF) SYRINGE
PREFILLED_SYRINGE | INTRAVENOUS | Status: DC | PRN
  Administered 2017-07-29: 5 mg via INTRAVENOUS
  Administered 2017-07-29: 30 mg via INTRAVENOUS
  Administered 2017-07-29: 5 mg via INTRAVENOUS

## 2017-07-29 MED ORDER — OXYCODONE HCL 5 MG/5ML PO SOLN
5.0000 mg | Freq: Once | ORAL | Status: DC | PRN
  Filled 2017-07-29: qty 5

## 2017-07-29 MED ORDER — CHLORHEXIDINE GLUCONATE 4 % EX LIQD: 60.0000 mL | Freq: Once | CUTANEOUS | Status: DC

## 2017-07-29 MED ORDER — PROPOFOL 10 MG/ML IV BOLUS
INTRAVENOUS | Status: AC
  Filled 2017-07-29: qty 20

## 2017-07-29 MED ORDER — APREPITANT 40 MG PO CAPS
ORAL_CAPSULE | ORAL | Status: AC
  Filled 2017-07-29: qty 1

## 2017-07-29 MED ORDER — LIDOCAINE 2% (20 MG/ML) 5 ML SYRINGE
INTRAMUSCULAR | Status: DC | PRN
  Administered 2017-07-29: 100 mg via INTRAVENOUS

## 2017-07-29 MED ORDER — SUCCINYLCHOLINE CHLORIDE 200 MG/10ML IV SOSY
PREFILLED_SYRINGE | INTRAVENOUS | Status: DC | PRN
  Administered 2017-07-29: 120 mg via INTRAVENOUS

## 2017-07-29 MED ORDER — ONDANSETRON HCL 4 MG/2ML IJ SOLN: 4.0000 mg | INTRAMUSCULAR | Status: DC | PRN

## 2017-07-29 MED ORDER — ENOXAPARIN SODIUM 30 MG/0.3ML ~~LOC~~ SOLN
30.0000 mg | Freq: Two times a day (BID) | SUBCUTANEOUS | Status: DC
  Administered 2017-07-29 – 2017-07-30 (×2): 30 mg via SUBCUTANEOUS
  Filled 2017-07-29 (×2): qty 0.3

## 2017-07-29 MED ORDER — CELECOXIB 200 MG PO CAPS
400.0000 mg | ORAL_CAPSULE | ORAL | Status: AC
  Administered 2017-07-29: 400 mg via ORAL

## 2017-07-29 MED ORDER — AMLODIPINE BESYLATE 10 MG PO TABS
10.0000 mg | ORAL_TABLET | Freq: Every day | ORAL | Status: DC
  Administered 2017-07-30: 10 mg via ORAL
  Filled 2017-07-29: qty 1

## 2017-07-29 MED ORDER — ACETAMINOPHEN 500 MG PO TABS
ORAL_TABLET | ORAL | Status: AC
  Filled 2017-07-29: qty 2

## 2017-07-29 MED ORDER — GABAPENTIN 300 MG PO CAPS
ORAL_CAPSULE | ORAL | Status: AC
  Filled 2017-07-29: qty 1

## 2017-07-29 MED ORDER — EPHEDRINE SULFATE-NACL 50-0.9 MG/10ML-% IV SOSY
PREFILLED_SYRINGE | INTRAVENOUS | Status: DC | PRN
  Administered 2017-07-29: 5 mg via INTRAVENOUS

## 2017-07-29 MED ORDER — MIDAZOLAM HCL 2 MG/2ML IJ SOLN
INTRAMUSCULAR | Status: DC | PRN
  Administered 2017-07-29: 2 mg via INTRAVENOUS

## 2017-07-29 MED ORDER — APREPITANT 40 MG PO CAPS
40.0000 mg | ORAL_CAPSULE | ORAL | Status: AC
  Administered 2017-07-29: 40 mg via ORAL

## 2017-07-29 MED ORDER — ACETAMINOPHEN 500 MG PO TABS
1000.0000 mg | ORAL_TABLET | ORAL | Status: AC
  Administered 2017-07-29: 1000 mg via ORAL

## 2017-07-29 MED ORDER — FENTANYL CITRATE (PF) 250 MCG/5ML IJ SOLN
INTRAMUSCULAR | Status: AC
  Filled 2017-07-29: qty 5

## 2017-07-29 MED ORDER — CEFOTETAN DISODIUM-DEXTROSE 2-2.08 GM-%(50ML) IV SOLR
INTRAVENOUS | Status: AC
  Filled 2017-07-29: qty 50

## 2017-07-29 MED ORDER — PROMETHAZINE HCL 25 MG/ML IJ SOLN: 6.2500 mg | INTRAMUSCULAR | Status: DC | PRN

## 2017-07-29 MED ORDER — PHENYLEPHRINE 40 MCG/ML (10ML) SYRINGE FOR IV PUSH (FOR BLOOD PRESSURE SUPPORT)
PREFILLED_SYRINGE | INTRAVENOUS | Status: DC | PRN
  Administered 2017-07-29 (×2): 120 ug via INTRAVENOUS
  Administered 2017-07-29 (×3): 80 ug via INTRAVENOUS

## 2017-07-29 MED ORDER — OXYCODONE HCL 5 MG PO TABS: 5.0000 mg | ORAL_TABLET | Freq: Once | ORAL | Status: DC | PRN

## 2017-07-29 MED ORDER — SCOPOLAMINE 1 MG/3DAYS TD PT72
1.0000 | MEDICATED_PATCH | TRANSDERMAL | Status: DC
  Administered 2017-07-29: 1.5 mg via TRANSDERMAL

## 2017-07-29 MED ORDER — BUPIVACAINE HCL (PF) 0.25 % IJ SOLN
INTRAMUSCULAR | Status: AC
  Filled 2017-07-29: qty 30

## 2017-07-29 MED ORDER — MEPERIDINE HCL 50 MG/ML IJ SOLN: 6.2500 mg | INTRAMUSCULAR | Status: DC | PRN

## 2017-07-29 MED ORDER — PROPOFOL 10 MG/ML IV BOLUS
INTRAVENOUS | Status: DC | PRN
  Administered 2017-07-29: 180 mg via INTRAVENOUS

## 2017-07-29 MED ORDER — MIDAZOLAM HCL 2 MG/2ML IJ SOLN
INTRAMUSCULAR | Status: AC
  Filled 2017-07-29: qty 2

## 2017-07-29 MED ORDER — HYDRALAZINE HCL 20 MG/ML IJ SOLN: 10.0000 mg | INTRAMUSCULAR | Status: DC | PRN

## 2017-07-29 MED ORDER — SCOPOLAMINE 1 MG/3DAYS TD PT72
MEDICATED_PATCH | TRANSDERMAL | Status: AC
  Filled 2017-07-29: qty 1

## 2017-07-29 MED ORDER — LACTATED RINGERS IR SOLN
Status: DC | PRN
  Administered 2017-07-29: 1000 mL

## 2017-07-29 MED ORDER — BUPIVACAINE HCL (PF) 0.25 % IJ SOLN
INTRAMUSCULAR | Status: DC | PRN
  Administered 2017-07-29: 30 mL

## 2017-07-29 MED ORDER — GABAPENTIN 100 MG PO CAPS
200.0000 mg | ORAL_CAPSULE | Freq: Two times a day (BID) | ORAL | Status: DC
  Administered 2017-07-29 – 2017-07-30 (×2): 200 mg via ORAL
  Filled 2017-07-29 (×2): qty 2

## 2017-07-29 MED ORDER — KETAMINE HCL 10 MG/ML IJ SOLN
INTRAMUSCULAR | Status: DC | PRN
  Administered 2017-07-29 (×2): 20 mg via INTRAVENOUS

## 2017-07-29 MED ORDER — ACETAMINOPHEN 160 MG/5ML PO SOLN
650.0000 mg | Freq: Four times a day (QID) | ORAL | Status: DC
  Administered 2017-07-29 – 2017-07-30 (×5): 650 mg via ORAL
  Filled 2017-07-29 (×5): qty 20.3

## 2017-07-29 MED ORDER — FENTANYL CITRATE (PF) 250 MCG/5ML IJ SOLN
INTRAMUSCULAR | Status: DC | PRN
  Administered 2017-07-29: 100 ug via INTRAVENOUS
  Administered 2017-07-29: 50 ug via INTRAVENOUS
  Administered 2017-07-29: 100 ug via INTRAVENOUS
  Administered 2017-07-29: 25 ug via INTRAVENOUS

## 2017-07-29 MED ORDER — METOPROLOL SUCCINATE ER 25 MG PO TB24
25.0000 mg | ORAL_TABLET | Freq: Every day | ORAL | Status: DC
  Administered 2017-07-30: 25 mg via ORAL
  Filled 2017-07-29: qty 1

## 2017-07-29 MED ORDER — SUGAMMADEX SODIUM 200 MG/2ML IV SOLN
INTRAVENOUS | Status: DC | PRN
  Administered 2017-07-29: 300 mg via INTRAVENOUS

## 2017-07-29 MED ORDER — HYDROMORPHONE HCL 1 MG/ML IJ SOLN: 0.2500 mg | INTRAMUSCULAR | Status: DC | PRN

## 2017-07-29 MED ORDER — ENOXAPARIN SODIUM 40 MG/0.4ML ~~LOC~~ SOLN
SUBCUTANEOUS | Status: AC
  Filled 2017-07-29: qty 0.4

## 2017-07-29 MED ORDER — OXYCODONE HCL 5 MG/5ML PO SOLN
5.0000 mg | Freq: Four times a day (QID) | ORAL | Status: DC | PRN
  Administered 2017-07-29 – 2017-07-30 (×2): 5 mg via ORAL
  Filled 2017-07-29 (×3): qty 5

## 2017-07-29 MED ORDER — ENOXAPARIN SODIUM 40 MG/0.4ML ~~LOC~~ SOLN
40.0000 mg | SUBCUTANEOUS | Status: AC
  Administered 2017-07-29: 40 mg via SUBCUTANEOUS

## 2017-07-29 MED ORDER — PROMETHAZINE HCL 25 MG/ML IJ SOLN
INTRAMUSCULAR | Status: AC
  Administered 2017-07-29: 12.5 mg
  Filled 2017-07-29: qty 1

## 2017-07-29 MED ORDER — CEFOTETAN DISODIUM-DEXTROSE 2-2.08 GM-%(50ML) IV SOLR
2.0000 g | INTRAVENOUS | Status: AC
  Administered 2017-07-29: 2 g via INTRAVENOUS
  Filled 2017-07-29: qty 50

## 2017-07-29 MED ORDER — DEXAMETHASONE SODIUM PHOSPHATE 10 MG/ML IJ SOLN
INTRAMUSCULAR | Status: DC | PRN
  Administered 2017-07-29: 4 mg via INTRAVENOUS

## 2017-07-29 MED ORDER — ALBUTEROL SULFATE (2.5 MG/3ML) 0.083% IN NEBU: 3.0000 mL | INHALATION_SOLUTION | Freq: Four times a day (QID) | RESPIRATORY_TRACT | Status: DC | PRN

## 2017-07-29 MED ORDER — HYDROMORPHONE HCL 1 MG/ML IJ SOLN: 0.5000 mg | INTRAMUSCULAR | Status: DC | PRN

## 2017-07-29 MED ORDER — METOPROLOL TARTRATE 5 MG/5ML IV SOLN: 5.0000 mg | Freq: Four times a day (QID) | INTRAVENOUS | Status: DC | PRN

## 2017-07-29 MED ORDER — GABAPENTIN 300 MG PO CAPS
300.0000 mg | ORAL_CAPSULE | ORAL | Status: AC
  Administered 2017-07-29: 300 mg via ORAL

## 2017-07-29 MED ORDER — ONDANSETRON HCL 4 MG/2ML IJ SOLN
INTRAMUSCULAR | Status: DC | PRN
  Administered 2017-07-29: 4 mg via INTRAVENOUS

## 2017-07-29 MED ORDER — 0.9 % SODIUM CHLORIDE (POUR BTL) OPTIME
TOPICAL | Status: DC | PRN
  Administered 2017-07-29: 1000 mL

## 2017-07-29 SURGICAL SUPPLY — 68 items
APL SKNCLS STERI-STRIP NONHPOA (GAUZE/BANDAGES/DRESSINGS) ×1
APPLIER CLIP ROT 10 11.4 M/L (STAPLE) ×2
APPLIER CLIP ROT 13.4 12 LRG (CLIP)
APR CLP LRG 13.4X12 ROT 20 MLT (CLIP)
APR CLP MED LRG 11.4X10 (STAPLE) ×1
BAG LAPAROSCOPIC 12 15 PORT 16 (BASKET) IMPLANT
BAG RETRIEVAL 12/15 (BASKET) ×2
BANDAGE ADH SHEER 1  50/CT (GAUZE/BANDAGES/DRESSINGS) ×12 IMPLANT
BENZOIN TINCTURE PRP APPL 2/3 (GAUZE/BANDAGES/DRESSINGS) ×2 IMPLANT
BLADE SURG SZ11 CARB STEEL (BLADE) ×2 IMPLANT
CABLE HIGH FREQUENCY MONO STRZ (ELECTRODE) ×2 IMPLANT
CHLORAPREP W/TINT 26ML (MISCELLANEOUS) ×3 IMPLANT
CLIP APPLIE ROT 10 11.4 M/L (STAPLE) IMPLANT
CLIP APPLIE ROT 13.4 12 LRG (CLIP) IMPLANT
COVER SURGICAL LIGHT HANDLE (MISCELLANEOUS) ×2 IMPLANT
DECANTER SPIKE VIAL GLASS SM (MISCELLANEOUS) ×2 IMPLANT
DEVICE SUT QUICK LOAD TK 5 (STAPLE) IMPLANT
DEVICE SUT TI-KNOT TK 5X26 (MISCELLANEOUS) IMPLANT
DRAPE UTILITY XL STRL (DRAPES) ×4 IMPLANT
ELECT REM PT RETURN 15FT ADLT (MISCELLANEOUS) ×2 IMPLANT
GAUZE SPONGE 4X4 12PLY STRL (GAUZE/BANDAGES/DRESSINGS) IMPLANT
GLOVE BIO SURGEON STRL SZ 6 (GLOVE) ×2 IMPLANT
GLOVE INDICATOR 6.5 STRL GRN (GLOVE) ×2 IMPLANT
GOWN STRL REUS W/TWL LRG LVL3 (GOWN DISPOSABLE) ×2 IMPLANT
GOWN STRL REUS W/TWL XL LVL3 (GOWN DISPOSABLE) ×4 IMPLANT
GRASPER SUT TROCAR 14GX15 (MISCELLANEOUS) ×2 IMPLANT
HOVERMATT SINGLE USE (MISCELLANEOUS) ×2 IMPLANT
KIT BASIN OR (CUSTOM PROCEDURE TRAY) ×2 IMPLANT
MARKER SKIN DUAL TIP RULER LAB (MISCELLANEOUS) ×2 IMPLANT
NDL SPNL 22GX3.5 QUINCKE BK (NEEDLE) ×1 IMPLANT
NEEDLE SPNL 22GX3.5 QUINCKE BK (NEEDLE) ×2 IMPLANT
PACK UNIVERSAL I (CUSTOM PROCEDURE TRAY) ×2 IMPLANT
RELOAD ENDO STITCH (ENDOMECHANICALS) IMPLANT
RELOAD STAPLE 60 3.6 BLU REG (STAPLE) ×1 IMPLANT
RELOAD STAPLE 60 3.8 GOLD REG (STAPLE) ×1 IMPLANT
RELOAD STAPLE 60 4.1 GRN THCK (STAPLE) IMPLANT
RELOAD STAPLER BLUE 60MM (STAPLE) ×4 IMPLANT
RELOAD STAPLER GOLD 60MM (STAPLE) ×1 IMPLANT
RELOAD STAPLER GREEN 60MM (STAPLE) ×1 IMPLANT
RELOAD SUT TRIPLE-STITCH 2-0 (ENDOMECHANICALS) IMPLANT
SCISSORS LAP 5X45 EPIX DISP (ENDOMECHANICALS) ×2 IMPLANT
SET IRRIG TUBING LAPAROSCOPIC (IRRIGATION / IRRIGATOR) ×2 IMPLANT
SHEARS HARMONIC ACE PLUS 45CM (MISCELLANEOUS) ×2 IMPLANT
SLEEVE ADV FIXATION 5X100MM (TROCAR) ×4 IMPLANT
SLEEVE GASTRECTOMY 40FR VISIGI (MISCELLANEOUS) ×2 IMPLANT
SOLUTION ANTI FOG 6CC (MISCELLANEOUS) ×2 IMPLANT
SPONGE LAP 18X18 X RAY DECT (DISPOSABLE) ×2 IMPLANT
STAPLER ECHELON BIOABSB 60 FLE (MISCELLANEOUS) ×9 IMPLANT
STAPLER ECHELON LONG 60 440 (INSTRUMENTS) ×2 IMPLANT
STAPLER RELOAD BLUE 60MM (STAPLE) ×8
STAPLER RELOAD GOLD 60MM (STAPLE) ×2
STAPLER RELOAD GREEN 60MM (STAPLE) ×2
STRIP CLOSURE SKIN 1/2X4 (GAUZE/BANDAGES/DRESSINGS) ×2 IMPLANT
SUT MNCRL AB 4-0 PS2 18 (SUTURE) ×2 IMPLANT
SUT SURGIDAC NAB ES-9 0 48 120 (SUTURE) IMPLANT
SUT VICRYL 0 TIES 12 18 (SUTURE) ×2 IMPLANT
SYR 10ML ECCENTRIC (SYRINGE) ×2 IMPLANT
SYR 20CC LL (SYRINGE) ×2 IMPLANT
SYR 50ML LL SCALE MARK (SYRINGE) ×2 IMPLANT
TOWEL OR 17X26 10 PK STRL BLUE (TOWEL DISPOSABLE) ×2 IMPLANT
TOWEL OR NON WOVEN STRL DISP B (DISPOSABLE) ×2 IMPLANT
TROCAR ADV FIXATION 5X100MM (TROCAR) ×2 IMPLANT
TROCAR BLADELESS 15MM (ENDOMECHANICALS) ×2 IMPLANT
TROCAR BLADELESS OPT 5 100 (ENDOMECHANICALS) ×2 IMPLANT
TUBE CALIBRATION LAPBAND (TUBING) ×1 IMPLANT
TUBING CONNECTING 10 (TUBING) ×3 IMPLANT
TUBING ENDO SMARTCAP (MISCELLANEOUS) ×2 IMPLANT
TUBING INSUF HEATED (TUBING) ×2 IMPLANT

## 2017-07-29 NOTE — Anesthesia Preprocedure Evaluation (Addendum)
Anesthesia Evaluation  Patient identified by MRN, date of birth, ID band Patient awake    Reviewed: Allergy & Precautions, NPO status , Patient's Chart, lab work & pertinent test results, reviewed documented beta blocker date and time   Airway Mallampati: II  TM Distance: >3 FB Neck ROM: Full    Dental no notable dental hx. (+) Dental Advisory Given, Chipped   Pulmonary neg pulmonary ROS, asthma , sleep apnea , former smoker,    Pulmonary exam normal breath sounds clear to auscultation       Cardiovascular hypertension, Pt. on medications and Pt. on home beta blockers negative cardio ROS Normal cardiovascular exam Rhythm:Regular Rate:Normal     Neuro/Psych Depression negative neurological ROS  negative psych ROS   GI/Hepatic negative GI ROS, Neg liver ROS, GERD  Controlled,  Endo/Other  negative endocrine ROSMorbid obesity  Renal/GU negative Renal ROS  negative genitourinary   Musculoskeletal negative musculoskeletal ROS (+) Arthritis , Osteoarthritis,    Abdominal (+) + obese,   Peds negative pediatric ROS (+)  Hematology negative hematology ROS (+)   Anesthesia Other Findings   Reproductive/Obstetrics negative OB ROS                            Anesthesia Physical Anesthesia Plan  ASA: III  Anesthesia Plan: General   Post-op Pain Management:    Induction: Intravenous  PONV Risk Score and Plan: 3 and Ondansetron, Dexamethasone and Midazolam  Airway Management Planned: Oral ETT  Additional Equipment:   Intra-op Plan:   Post-operative Plan: Extubation in OR  Informed Consent: I have reviewed the patients History and Physical, chart, labs and discussed the procedure including the risks, benefits and alternatives for the proposed anesthesia with the patient or authorized representative who has indicated his/her understanding and acceptance.   Dental advisory given  Plan  Discussed with: CRNA  Anesthesia Plan Comments:         Anesthesia Quick Evaluation

## 2017-07-29 NOTE — Anesthesia Procedure Notes (Signed)
Date/Time: 07/29/2017 9:12 AM Performed by: Cynda Familia, CRNA Oxygen Delivery Method: Simple face mask Placement Confirmation: positive ETCO2 and breath sounds checked- equal and bilateral Dental Injury: Teeth and Oropharynx as per pre-operative assessment

## 2017-07-29 NOTE — Progress Notes (Signed)
Patient arrived to 1536 via stretcher from PACU. Ambulated from hallway to bed, then to bathroom where she voided clear amber urine. Patient nauseated with dry heaves; given Phenergan by PACU RN. Donne Hazel, RN

## 2017-07-29 NOTE — Discharge Instructions (Signed)
° ° ° °GASTRIC BYPASS/SLEEVE ° Home Care Instructions ° ° These instructions are to help you care for yourself when you go home. ° °Call: If you have any problems. °• Call [phone] and ask for the surgeon on call °• If you need immediate help, come to the ER at [place].  °• Tell the ER staff that you are a new post-op gastric bypass or gastric sleeve patient °  °Signs and symptoms to report: • Severe vomiting or nausea °o If you cannot keep down clear liquids for longer than 1 day, call your surgeon  °• Abdominal pain that does not get better after taking your pain medication °• Fever over 100.4° F with chills °• Heart beating over 100 beats a minute °• Shortness of breath at rest °• Chest pain °•  Redness, swelling, drainage, or foul odor at incision (surgical) sites °•  If your incisions open or pull apart °• Swelling or pain in calf (lower leg) °• Diarrhea (Loose bowel movements that happen often), frequent watery, uncontrolled bowel movements °• Constipation, (no bowel movements for 3 days) if this happens: Pick one °o Milk of Magnesia, 2 tablespoons by mouth, 3 times a day for 2 days if needed °o Stop taking Milk of Magnesia once you have a bowel movement °o Call your doctor if constipation continues °Or °o Miralax  (instead of Milk of Magnesia) following the label instructions °o Stop taking Miralax once you have a bowel movement °o Call your doctor if constipation continues °• Anything you think is not normal °  °Normal side effects after surgery: • Unable to sleep at night or unable to focus °• Irritability or moody °• Being tearful (crying) or depressed °These are common complaints, possibly related to your anesthesia medications that put you to sleep, stress of surgery, and change in lifestyle.  This usually goes away a few weeks after surgery.  If these feelings continue, call your primary care doctor. °  °Wound Care: You may have surgical glue, steri-strips, or staples over your incisions after  surgery °• Surgical glue:  Looks like a clear film over your incisions and will wear off a little at a time °• Steri-strips: Strips of tape over your incisions. You may notice a yellowish color on the skin under the steri-strips. This is used to make the   steri-strips stick better. Do not pull the steri-strips off - let them fall off °• Staples: Staples may be removed before you leave the hospital °o If you go home with staples, call [facility], [phone] at for an appointment with your surgeon’s nurse to have staples removed 10 days after surgery. °• Showering: You may shower two (2) days after your surgery unless your surgeon tells you differently °o Wash gently around incisions with warm soapy water, rinse well, and gently pat dry  °o No tub baths until staples are removed, steri-strips fall off or glue is gone.  °  °Medications: • Medications should be liquid or crushed if larger than the size of a dime °• Extended release pills (medication that release a little bit at a time through the day) should NOT be crushed or cut. (examples include XL, ER, DR, SR) °• Depending on the size and number of medications you take, you may need to space (take a few throughout the day)/change the time you take your medications so that you do not over-fill your pouch (smaller stomach) °• Make sure you follow-up with your primary care doctor to   make medication changes needed during rapid weight loss and life-style changes °• If you have diabetes, follow up with the doctor that orders your diabetes medication(s) within one week after surgery and check your blood sugar regularly. °• Do not drive while taking prescription pain medication  °• It is ok to take Tylenol by the bottle instructions with your pain medicine or instead of your pain medicine as needed.  DO NOT TAKE NSAIDS (EXAMPLES OF NSAIDS:  IBUPROFREN/ NAPROXEN)  °Diet:                    First 2 Weeks ° You will see the dietician t about two (2) weeks  after your surgery. The dietician will increase the types of foods you can eat if you are handling liquids well: °• If you have severe vomiting or nausea and cannot keep down clear liquids lasting longer than 1 day, call your surgeon @ ([phone]) °Protein Shake °• Drink at least 2 ounces of shake 5-6 times per day °• Each serving of protein shakes (usually 8 - 12 ounces) should have: °o 15 grams of protein  °o And no more than 5 grams of carbohydrate  °• Goal for protein each day: °o Men = 80 grams per day °o Women = 60 grams per day °• Protein powder may be added to fluids such as non-fat milk or Lactaid milk or unsweetened Soy/Almond milk (limit to 35 grams added protein powder per serving) ° °Hydration °• Slowly increase the amount of water and other clear liquids as tolerated (See Acceptable Fluids) °• Slowly increase the amount of protein shake as tolerated  °•  Sip fluids slowly and throughout the day.  Do not use straws. °• May use sugar substitutes in small amounts (no more than 6 - 8 packets per day; i.e. Splenda) ° °Fluid Goal °• The first goal is to drink at least 8 ounces of protein shake/drink per day (or as directed by the nutritionist); some examples of protein shakes are Syntrax Nectar, Adkins Advantage, EAS Edge HP, and Unjury. See handout from pre-op Bariatric Education Class: °o Slowly increase the amount of protein shake you drink as tolerated °o You may find it easier to slowly sip shakes throughout the day °o It is important to get your proteins in first °• Your fluid goal is to drink 64 - 100 ounces of fluid daily °o It may take a few weeks to build up to this °• 32 oz (or more) should be clear liquids  °And  °• 32 oz (or more) should be full liquids (see below for examples) °• Liquids should not contain sugar, caffeine, or carbonation ° °Clear Liquids: °• Water or Sugar-free flavored water (i.e. Fruit H2O, Propel) °• Decaffeinated coffee or tea (sugar-free) °• Crystal Lite, Wyler’s Lite,  Minute Maid Lite °• Sugar-free Jell-O °• Bouillon or broth °• Sugar-free Popsicle:   *Less than 20 calories each; Limit 1 per day ° °Full Liquids: °Protein Shakes/Drinks + 2 choices per day of other full liquids °• Full liquids must be: °o No More Than 15 grams of Carbs per serving  °o No More Than 3 grams of Fat per serving °• Strained low-fat cream soup (except Cream of Potato or Tomato) °• Non-Fat milk °• Fat-free Lactaid Milk °• Unsweetened Soy Or Unsweetened Almond Milk °• Low Sugar yogurt (Dannon Lite & Fit, Greek yogurt; Oikos Triple Zero; Chobani Simply 100; Yoplait 100 calorie Greek - No Fruit on the Bottom) ° °  °Vitamins   and Minerals • Start 1 day after surgery unless otherwise directed by your surgeon °• 2 Chewable Bariatric Specific Multivitamin / Multimineral Supplement with iron (Example: Bariatric Advantage Multi EA) °• Chewable Calcium with Vitamin D-3 °(Example: 3 Chewable Calcium Plus 600 with Vitamin D-3) °o Take 500 mg three (3) times a day for a total of 1500 mg each day °o Do not take all 3 doses of calcium at one time as it may cause constipation, and you can only absorb 500 mg  at a time  °o Do not mix multivitamins containing iron with calcium supplements; take 2 hours apart °• Menstruating women and those with a history of anemia (a blood disease that causes weakness) may need extra iron °o Talk with your doctor to see if you need more iron °• Do not stop taking or change any vitamins or minerals until you talk to your dietitian or surgeon °• Your Dietitian and/or surgeon must approve all vitamin and mineral supplements °  °Activity and Exercise: Limit your physical activity as instructed by your doctor.  It is important to continue walking at home.  During this time, use these guidelines: °• Do not lift anything greater than ten (10) pounds for at least two (2) weeks °• Do not go back to work or drive until your surgeon says you can °• You may have sex when you feel comfortable  °o It is  VERY important for female patients to use a reliable birth control method; fertility often increases after surgery  °o All hormonal birth control will be ineffective for 30 days after surgery due to medications given during surgery a barrier method must be used. °o Do not get pregnant for at least 18 months °• Start exercising as soon as your doctor tells you that you can °o Make sure your doctor approves any physical activity °• Start with a simple walking program °• Walk 5-15 minutes each day, 7 days per week.  °• Slowly increase until you are walking 30-45 minutes per day °Consider joining our BELT program. [phone] or email [email or web address] °  °Special Instructions Things to remember: °• Use your CPAP when sleeping if this applies to you ° °• [facility] has two free Bariatric Surgery Support Groups that meet monthly °o The 3rd Thursday of each month, 6 pm, [facility]  °o The 2nd Friday of each month, 11:45 am in the private dining room in the basement of [facility] °• It is very important to keep all follow up appointments with your surgeon, dietitian, primary care physician, and behavioral health practitioner °• Routine follow up schedule with your surgeon include appointments at 2-3 weeks, 6-8 weeks, 6 months, and 1 year at a minimum.  Your surgeon may request to see you more often.   °o After the first year, please follow up with your bariatric surgeon and dietitian at least once a year in order to maintain best weight loss results °[facility]: [phone] °[facility]: [phone] °Bariatric Nurse Coordinator: [phone] °  °   Reviewed and Endorsed  °by [facility] Patient Education Committee, June, 2016 °Edits Approved: Aug, 2018 ° ° ° °

## 2017-07-29 NOTE — Transfer of Care (Signed)
Immediate Anesthesia Transfer of Care Note  Patient: Makayla Good  Procedure(s) Performed: LAPAROSCOPIC GASTRIC SLEEVE RESECTION WITH UPPER ENDO  (N/A Abdomen)  Patient Location: PACU  Anesthesia Type:General  Level of Consciousness: awake and alert   Airway & Oxygen Therapy: Patient Spontanous Breathing and Patient connected to face mask oxygen  Post-op Assessment: Report given to RN and Post -op Vital signs reviewed and stable  Post vital signs: Reviewed and stable  Last Vitals:  Vitals Value Taken Time  BP 155/75 07/29/2017  9:18 AM  Temp 36.7 C 07/29/2017  9:18 AM  Pulse 94 07/29/2017  9:19 AM  Resp 18 07/29/2017  9:19 AM  SpO2 95 % 07/29/2017  9:19 AM  Vitals shown include unvalidated device data.  Last Pain:  Vitals:   07/29/17 0557  TempSrc: Oral         Complications: No apparent anesthesia complications

## 2017-07-29 NOTE — H&P (Signed)
Makayla Good DOB: 1971/03/01 Married / Language: English / Race: White Female  History of Present Illness Patient words: Here for preoperative visit. She is really looking forward to moving her with surgery and has been given a tentative date of April 1. She has completed the preoperative pathway. She was deemed an appropriate candidate by the psychologist as well as nutritionist. Her preoperative labs showed a borderline low iron level and a hemoglobin A1c of 6.2 but otherwise unremarkable. Her chest x-ray showed mild cardiomegaly and chronic pulmonary interstitial prominence, upper GI was normal and did not show hiatal hernia. She has a few questions to go over today.  Initial visit: "Makayla Good'" present to discuss bariatric surgery. She is been struggling with her obesity and her entire life. She states the last time she was below 200 pounds she was in high school. She has tried various methods of weight loss including over-the-counter appetite suppressants, phentermine, Weight Watchers etc. She states that she will try something, feel it is not working fast enough, and switch to something else. She'll lose a little bit of weight but then regain it all. She tries to stay active, however she has significant bilateral knee pain and this is limiting to her in terms of exercise regimens. She tries to walk on a daily basis. She has history of mild reflux, she takes PPIs as needed, and this is almost on a daily basis. She is a daily smoker, half pack per day. She was able to quit for about 6 months but then resumed. This was secondary to life stressors. She does not have diabetes presently.  She denies any history of abdominal surgery.  The patient is a 47 year old female who presents for a bariatric surgery evaluation. Associated symptoms include depressed mood, poor self esteem, lost range of motion, joint pains and heartburn. Initial onset of obesity was in childhood. Initial  presentation included inability to lose weight. Disease complications include hypertension and osteoarthritis. Current diet includes well balanced meals. less than once per week (She is limited by joint pain) and aerobic. The patient is currently able to do activities of daily living without limitations, able to work without limitations, able to do housework without limitations and unable to participate in sports. Past treatment has included appetite suppressants and weight loss group. Cardiac history: The patient has smoking (She smokes half a pack per day). Gastrointestinal History: Patient has heartburn (Her symptoms are completely controlled with ppi prn). MBSQIP recognized comorbidities: Patient has hypertension and gastroesophageal reflux disease.     Allergies No Known Drug Allergies  Medication History  Potassium Chloride Crys ER (20MEQ Tablet ER, Oral) Active. Pantoprazole Sodium (40MG  Tablet DR, Oral) Active. Metoprolol Succinate ER (25MG  Tablet ER 24HR, Oral) Active. HydroCHLOROthiazide (25MG  Tablet, Oral) Active. AmLODIPine Besylate (10MG  Tablet, Oral) Active. Vitamin B-12 (1000MCG Tablet, 1 (one) Oral) Active. Multiple Vitamin (1 (one) Oral) Active. Fish Oil Concentrate (435MG  Capsule, 1 (one) Oral) Active. Medications Reconciled    Vitals:   07/29/17 0557  BP: (!) 151/91  Pulse: 85  Resp: 18  Temp: 98.3 F (36.8 C)  SpO2: 98%         Physical Exam   The physical exam findings are as follows: Note:She is alert and well-appearing Extraocular motions intact, no scleral icterus Moist mucous membranes, dentition intact Unlabored respirations, symmetrical air entry Regular rate and rhythm, mild bilateral LE nonpitting edema Abdomen is obese, nontender nondistended. No palpable mass or hernia. Well-healed Pfannenstiel scar. Extremities warm without deformity Neuro  grossly normal, normal gait Psych mood and affect, appropriate insight Skin  is warm and dry    Assessment & Plan  MORBID OBESITY (E66.01) Story: She has completed the preoperative pathway without any barriers identified. She remains an excellent candidate for weight loss surgery. We discussed the typical perioperative and postoperative course and again went over the surgical technique and risks involved. Questions were welcomed and answered.  Given her smoking history I advised her that sleeve and probably the best option. We discussed that surgery in detail. I described the risks of surgery to her including bleeding, pain, scarring, intra-abdominal injury, staple line leak, hernia, chronic abdominal pain or nausea, weight regain, as well as the risks of general anesthesia including heart attack, blood clots, stroke, pneumonia and death. We discussed the typical preoperative, perioperative and postoperative pathway. She had several questions all of which were answered to her satisfaction. I emphasized to her that the nutritional and psychological evaluation and consultation are some of the most important pieces of her overall chances of success with sustained weight loss. We'll get her started on the pathway.   HYPERTENSION (I10)   OSTEOARTHRITIS (M19.90)

## 2017-07-29 NOTE — Op Note (Signed)
Operative Note  Makayla Good  [id number]  [id number]  07/29/2017   Surgeon: Victorino Sparrow ConnorMD  Assistant: Greer Pickerel MD  Procedure performed: laparoscopic sleeve gastrectomy,upper endoscopy  Preop diagnosis: Morbid obesity Body mass index is 43.27 kg/m., sleep apnea, polycystic ovary, hypertension, hyperlipidemia, asthma, history of tobacco abuse, GERD Post-op diagnosis/intraop findings: same  Specimens: fundus Retained items: none EBL: minimal cc Complications: none  Description of procedure: After obtaining informed consent and administration of prophylactic lovenox in holding, the patient was taken to the operating room and placed supine on operating room table wheregeneral endotracheal anesthesia was initiated, preoperative antibiotics were administered, SCDs applied, and a formal timeout was performed. The abdomen was prepped and draped in usual sterile fashion. Peritoneal access was gained using a Visiport technique in the left upper quadrant and insufflation to 15 mmHg ensued without issue. Gross inspection revealed no evidence of injury. Under direct visualization three more 5 mm trochars were placed in the right and left hemiabdomen and the 21mm trocar in the right paramedian upper abdomen. Bilateral laparoscopic assisted TAPS blocks were performed with Exparel diluted with 0.25 percent Marcaine. The patient was placed in steep Trendelenburg and the liver retractor was introduced through an incision in the upper midline and secured to the post externally to maintain the left lobe retracted anteriorly. The calibration tubing was passed down by the anesthesiologist and the balloon inflated to 10 mL. The tubing was gently pulled back by the anesthesiologist and was not able to traverse the hiatus, confirming absence of hiatal hernia. The calibration tubing was then deflated, removed and discarded. Using the Harmonic scalpel, the greater curvature of the stomach was dissected  away from the greater omentum and short gastric vessels were divided. This began 6 cm from the pylorus, and dissection proceeded until the left crus was clearly exposed. There were some filmy adhesions of the posterior stomach to the pancreas which were taken down the Harmonic. The 75 [place] VisiGi was then introduced and directed down towards the pylorus. This was placed to suction against the lesser curve. Serial fires of the linear cutting stapler with Peri-Strips were then employed to create our sleeve. The first fire used a green load and ensured adequate room at the angularis incisura. One gold load and then 3 blue loads were then employed to create a narrow tubular stomach all up to the angle of His. The excised stomach was then removed through our 15 mm trocar site within an Endo Catch bag. The visigi was taken off of suction and a few puffs of air were introduced, inflating the sleeve. No bubbles were observed and the irrigation fluid around the stomach and the shape was noted to be a nice smooth tube without any narrowing at the angularis. The visigi was then removed. Upper endoscopy was performed by the assistant surgeon and the sleeve was noted to be airtight, the staple line was hemostatic, with no undue angulation or narrowing of the lumen specifically at the incisura. Please see his separate note. The endoscope was removed. The 15 mm trocar site fascia in the right upper abdomen was closed with 2 interrupted sutures of 0 Vicryl using the laparoscopic suture passer under direct visualization. The liver retractor was removed under direct visualization. The abdomen was then desufflated and all remaining trochars removed. The skin incisions were closed with running subcuticular Monocryl; benzoin, Steri-Strips and Band-Aids were applied The patient was then awakened, extubated and taken to PACU in stable condition.    All counts were  correct at the completion of the case.

## 2017-07-29 NOTE — Progress Notes (Signed)
Discussed post op day goals with patient including ambulation, IS, diet progression, pain, and nausea control.  Questions answered. 

## 2017-07-29 NOTE — Anesthesia Postprocedure Evaluation (Signed)
Anesthesia Post Note  Patient: [name]  Procedure(s) Performed: LAPAROSCOPIC GASTRIC SLEEVE RESECTION WITH UPPER ENDO  (N/A Abdomen)     Patient location during evaluation: PACU Anesthesia Type: General Level of consciousness: awake and alert Pain management: pain level controlled Vital Signs Assessment: post-procedure vital signs reviewed and stable Respiratory status: spontaneous breathing, nonlabored ventilation and respiratory function stable Cardiovascular status: blood pressure returned to baseline and stable Postop Assessment: no apparent nausea or vomiting Anesthetic complications: no    Last Vitals:  Vitals:   07/29/17 1000 07/29/17 1021  BP: (!) 143/70 (!) 147/73  Pulse: 87 87  Resp: 15 15  Temp: 36.7 C 36.4 C  SpO2: 92% 93%    Last Pain:  Vitals:   07/29/17 1000  TempSrc:   PainSc: 0-No pain                 Lynda Rainwater

## 2017-07-29 NOTE — Anesthesia Procedure Notes (Signed)
Procedure Name: Intubation Date/Time: 07/29/2017 7:29 AM Performed by: Cynda Familia, CRNA Pre-anesthesia Checklist: Patient identified, Emergency Drugs available, Suction available and Patient being monitored Patient Re-evaluated:Patient Re-evaluated prior to induction Oxygen Delivery Method: Circle System Utilized Preoxygenation: Pre-oxygenation with 100% oxygen Induction Type: IV induction Ventilation: Mask ventilation without difficulty Laryngoscope Size: Miller and 2 Grade View: Grade I Tube type: Oral Tube size: 7.0 mm Number of attempts: 1 Airway Equipment and Method: Stylet Placement Confirmation: ETT inserted through vocal cords under direct vision,  positive ETCO2 and breath sounds checked- equal and bilateral Secured at: 22 cm Tube secured with: Tape Dental Injury: Teeth and Oropharynx as per pre-operative assessment  Comments: Smooth IV induction Miller-- intubation AM CRNA atraumatic-- teeth and mouth as preop--- chipped front teeth unchanged-- bilat BS [employer]

## 2017-07-29 NOTE — Op Note (Signed)
Makayla Good [id number] [birth year] 07/29/2017  Preoperative diagnosis: morbid obesity  Postoperative diagnosis: Same   Procedure: upper endoscopy   Surgeon: Leighton Ruff. Siaosi Alter M.D., FACS   Anesthesia: Gen.   Indications for procedure: 47 y.o. year old female undergoing Laparoscopic Gastric Sleeve Resection and an EGD was requested to evaluate the new gastric sleeve.   Description of procedure: After we have completed the sleeve resection, I scrubbed out and obtained the Olympus endoscope. I gently placed endoscope in the patient's oropharynx and gently glided it down the esophagus without any difficulty under direct visualization. Once I was in the gastric sleeve, I insufflated the stomach with air. I was able to cannulate and advanced the scope through the gastric sleeve. I was able to cannulate the duodenum with ease. Dr. Kae Heller had placed saline in the upper abdomen. Upon further insufflation of the gastric sleeve there was no evidence of bubbles. GE junction located at 39 cm. Some slightly irregular z line c/w mild reflux.  Upon further inspection of the gastric sleeve, the mucosa appeared normal. There is no evidence of any mucosal abnormality. The sleeve was widely patent at the angularis. There was no evidence of bleeding. The gastric sleeve was decompressed. The scope was withdrawn. The patient tolerated this portion of the procedure well. Please see Dr Ron Parker operative note for details regarding the laparoscopic gastric sleeve resection.   Leighton Ruff. Redmond Pulling, MD, FACS  General, Bariatric, & Minimally Invasive Surgery  [facility], [place]

## 2017-07-30 ENCOUNTER — Encounter (HOSPITAL_COMMUNITY): Payer: Self-pay

## 2017-07-30 LAB — CBC WITH DIFFERENTIAL/PLATELET
Basophils Absolute: 0 10*3/uL (ref 0.0–0.1)
Basophils Relative: 0 %
Eosinophils Absolute: 0 10*3/uL (ref 0.0–0.7)
Eosinophils Relative: 0 %
HCT: 33.1 % — ABNORMAL LOW (ref 36.0–46.0)
Hemoglobin: 10.7 g/dL — ABNORMAL LOW (ref 12.0–15.0)
Lymphocytes Relative: 29 %
Lymphs Abs: 2.9 10*3/uL (ref 0.7–4.0)
MCH: 28.7 pg (ref 26.0–34.0)
MCHC: 32.3 g/dL (ref 30.0–36.0)
MCV: 88.7 fL (ref 78.0–100.0)
Monocytes Absolute: 0.9 10*3/uL (ref 0.1–1.0)
Monocytes Relative: 9 %
Neutro Abs: 6.2 10*3/uL (ref 1.7–7.7)
Neutrophils Relative %: 62 %
Platelets: 213 10*3/uL (ref 150–400)
RBC: 3.73 MIL/uL — ABNORMAL LOW (ref 3.87–5.11)
RDW: 14.8 % (ref 11.5–15.5)
WBC: 10 10*3/uL (ref 4.0–10.5)

## 2017-07-30 LAB — COMPREHENSIVE METABOLIC PANEL
ALT: 30 U/L (ref 14–54)
AST: 26 U/L (ref 15–41)
Albumin: 3.4 g/dL — ABNORMAL LOW (ref 3.5–5.0)
Alkaline Phosphatase: 60 U/L (ref 38–126)
Anion gap: 10 (ref 5–15)
BUN: 7 mg/dL (ref 6–20)
CO2: 24 mmol/L (ref 22–32)
Calcium: 8.9 mg/dL (ref 8.9–10.3)
Chloride: 107 mmol/L (ref 101–111)
Creatinine, Ser: 0.65 mg/dL (ref 0.44–1.00)
GFR calc Af Amer: >60 mL/min (ref >60)
GFR calc non Af Amer: >60 mL/min (ref >60)
Glucose, Bld: 102 mg/dL — ABNORMAL HIGH (ref 65–99)
Potassium: 3.2 mmol/L — ABNORMAL LOW (ref 3.5–5.1)
Sodium: 141 mmol/L (ref 135–145)
Total Bilirubin: 0.6 mg/dL (ref 0.3–1.2)
Total Protein: 6.7 g/dL (ref 6.5–8.1)

## 2017-07-30 MED ORDER — POTASSIUM CHLORIDE CRYS ER 20 MEQ PO TBCR
40.0000 meq | EXTENDED_RELEASE_TABLET | Freq: Once | ORAL | Status: AC
  Administered 2017-07-30: 40 meq via ORAL
  Filled 2017-07-30: qty 2

## 2017-07-30 MED ORDER — POTASSIUM CHLORIDE 10 MEQ/100ML IV SOLN
10.0000 meq | INTRAVENOUS | Status: AC
Start: 2017-07-30 — End: 2017-07-30
  Administered 2017-07-30 (×2): 10 meq via INTRAVENOUS
  Filled 2017-07-30 (×2): qty 100

## 2017-07-30 MED ORDER — ONDANSETRON 4 MG PO TBDP: 4.0000 mg | ORAL_TABLET | Freq: Three times a day (TID) | ORAL | 0 refills | Status: DC | PRN

## 2017-07-30 MED ORDER — OXYCODONE HCL 5 MG/5ML PO SOLN: 5.0000 mg | Freq: Four times a day (QID) | ORAL | 0 refills | Status: DC | PRN

## 2017-07-30 MED ORDER — ACETAMINOPHEN 160 MG/5ML PO SOLN: 650.0000 mg | Freq: Four times a day (QID) | ORAL | 0 refills | Status: DC

## 2017-07-30 NOTE — Progress Notes (Signed)
S: Slept well. Had some nausea yesterday. Mild discomfort in epigastrium with PO intake, no dysphagia/emesis/reflux. Sipping protein shake already. Has walked the halls multiple times.   Vitals, labs, intake/output, and orders reviewed at this time. Tmax=Tcurrent=37.6. HR 89-98. Mildly hypertensive. sats 100% room air. PO 649mL, UOP 2400. CMP- K 3.2, otherwise unremarkable. CBC- WBC 10 (5.8 preop), hgb 10.7 (11.8 preop)  Gen: A&Ox3, no distress  H&N: EOMI, atraumatic, neck supple Chest: unlabored respirations, RRR Abd: soft, minimally tender around incisions which are dry with steris/bandaids in place, nondistended Ext: warm, no edema Neuro: grossly normal  Lines/tubes/drains: PIV  A/P:  POD 1 laparoscopic sleeve gastrectomy, doing well -Continue clears/ protein shakes as tolerated -Ambulate, pulm toilet -Hypokalemia-replace K+ -Plan discharge today  Romana Juniper, MD [facility], [place] Pager [phone]

## 2017-07-30 NOTE — Progress Notes (Signed)
Pt alert and oriented, tolerating liquids.  D/C instructions were given and all questions answered.  Pt was d/cd home with spouse.

## 2017-07-30 NOTE — Plan of Care (Signed)
Nutrition Education Note  Received consult for diet education per DROP protocol.   Discussed 2 week post op diet with pt. Emphasized that liquids must be non carbonated, non caffeinated, and sugar free. Fluid goals discussed. Pt to follow up with outpatient bariatric RD for further diet progression after 2 weeks. Multivitamins and minerals also reviewed. Teach back method used, pt expressed understanding, expect good compliance.   Diet: First 2 Weeks  You will see the nutritionist about two (2) weeks after your surgery. The nutritionist will increase the types of foods you can eat if you are handling liquids well:  If you have severe vomiting or nausea and cannot handle clear liquids lasting longer than 1 day, call your surgeon  Protein Shake  Drink at least 2 ounces of shake 5-6 times per day  Each serving of protein shakes (usually 8 - 12 ounces) should have a minimum of:  15 grams of protein  And no more than 5 grams of carbohydrate  Goal for protein each day:  Men = 80 grams per day  Women = 60 grams per day  Protein powder may be added to fluids such as non-fat milk or Lactaid milk or Soy milk (limit to 35 grams added protein powder per serving)   Hydration  Slowly increase the amount of water and other clear liquids as tolerated (See Acceptable Fluids)  Slowly increase the amount of protein shake as tolerated  Sip fluids slowly and throughout the day  May use sugar substitutes in small amounts (no more than 6 - 8 packets per day; i.e. Splenda)   Fluid Goal  The first goal is to drink at least 8 ounces of protein shake/drink per day (or as directed by the nutritionist); some examples of protein shakes are Premier Protein, Syntrax Nectar, Adkins Advantage, EAS Edge HP, and Unjury. See handout from pre-op Bariatric Education Class:  Slowly increase the amount of protein shake you drink as tolerated  You may find it easier to slowly sip shakes throughout the day  It is important to  get your proteins in first  Your fluid goal is to drink 64 - 100 ounces of fluid daily  It may take a few weeks to build up to this  32 oz (or more) should be clear liquids  And  32 oz (or more) should be full liquids (see below for examples)  Liquids should not contain sugar, caffeine, or carbonation   Clear Liquids:  Water or Sugar-free flavored water (i.e. Fruit H2O, Propel)  Decaffeinated coffee or tea (sugar-free)  Crystal Lite, Wyler?s Lite, Minute Maid Lite  Sugar-free Jell-O  Bouillon or broth  Sugar-free Popsicle: *Less than 20 calories each; Limit 1 per day   Full Liquids:  Protein Shakes/Drinks + 2 choices per day of other full liquids  Full liquids must be:  No More Than 12 grams of Carbs per serving  No More Than 3 grams of Fat per serving  Strained low-fat cream soup  Non-Fat milk  Fat-free Lactaid Milk  Sugar-free yogurt (Dannon Lite & Fit, Greek yogurt, Oikos Zero)   Zarion Oliff RD, LDN Clinical Nutrition Pager # - [phone]   

## 2017-07-30 NOTE — Progress Notes (Signed)
Patient alert and oriented, pain is controlled. Patient is tolerating fluids, advanced to protein shake today, patient is tolerating well.  Reviewed Gastric sleeve discharge instructions with patient and patient is able to articulate understanding.  Provided information on BELT program, Support Group and [facility] outpatient pharmacy. All questions answered, will continue to monitor.  

## 2017-07-30 NOTE — Discharge Summary (Signed)
Physician Discharge Summary  Makayla Good BUL:[id number] DOB: 1970/09/01 DOA: 07/29/2017  PCP: [name], DO  Admit date: 07/29/2017 Discharge date:   Recommendations for Outpatient Follow-up:   Follow-up Information    Makayla Riley, MD. Go on 08/22/2017.   Specialty:  General Surgery Why:  at 1020 am Contact information: [address] [place] [address] [phone]        Makayla Riley, MD .   Specialty:  General Surgery Contact information: [address] [place] [address] [phone]          Discharge Diagnoses:  Active Problems:   Morbid obesity ([place])   Surgical Procedure: Laparoscopic Sleeve Gastrectomy, upper endoscopy  Discharge Condition: Good Disposition: Home  Diet recommendation: Postoperative sleeve gastrectomy diet (liquids only)  Filed Weights   07/29/17 0557  Weight: 117.9 kg (260 lb)     Hospital Course:  The patient was admitted for a planned laparoscopic sleeve gastrectomy. Please see operative note. Preoperatively the patient was given lovenox for DVT prophylaxis. Postoperative prophylactic Lovenox dosing was started on the evening of postoperative day 0. ERAS protocol was used. On the evening of postoperative day 0, the patient was started on water and ice chips. On postoperative day 1 the patient had no fever or tachycardia and was tolerating water in their diet was gradually advanced throughout the day. The patient was ambulating without difficulty. Their vital signs are stable without fever or tachycardia. Their hemoglobin had remained stable. The patient was maintained on their home settings for CPAP therapy. The patient had received discharge instructions and counseling. They were deemed stable for discharge and had met discharge criteria   Discharge Instructions  Discharge Instructions    Ambulate hourly while awake   Complete by:  As directed    Call MD for:   difficulty breathing, headache or visual disturbances   Complete by:  As directed    Call MD for:  persistant dizziness or light-headedness   Complete by:  As directed    Call MD for:  persistant nausea and vomiting   Complete by:  As directed    Call MD for:  redness, tenderness, or signs of infection (pain, swelling, redness, odor or green/yellow discharge around incision site)   Complete by:  As directed    Call MD for:  severe uncontrolled pain   Complete by:  As directed    Call MD for:  temperature >101 F   Complete by:  As directed    Incentive spirometry   Complete by:  As directed    Perform hourly while awake     Allergies as of 07/30/2017   No Known Allergies     Medication List    TAKE these medications   acetaminophen 160 MG/5ML solution Commonly known as:  TYLENOL Take 20.3 mLs (650 mg total) by mouth every 6 (six) hours.   albuterol 108 (90 Base) MCG/ACT inhaler Commonly known as:  PROVENTIL HFA;VENTOLIN HFA Inhale 2 puffs into the lungs every 6 (six) hours as needed for wheezing or shortness of breath.   amLODipine 10 MG tablet Commonly known as:  NORVASC take 1 tablet by mouth once daily What changed:    how much to take  how to take this  when to take this Notes to patient:  Follow up with your primary care provider within 1-2 weeks of surgery and be sure to check your blood pressure as you may not need this  medication in the future   CALCIUM CITRATE + D PO Take 1 each by mouth daily.   CELEBRATE MULTI-COMPLETE 36 PO Take 1 each by mouth daily.   hydrochlorothiazide 25 MG tablet Commonly known as:  HYDRODIURIL Take 1 tablet (25 mg total) by mouth daily. Notes to patient:  Monitor Blood Pressure Daily and keep a log for primary care physician.  Monitor for symptoms of dehydration.  You may need to make changes to your medications with rapid weight loss.   Follow up with your primary care provider within 1-2 weeks of surgery and be sure to check  your blood pressure as you may not need this medication in the future   metoprolol succinate 25 MG 24 hr tablet Commonly known as:  TOPROL-XL take 1 tablet by mouth once daily What changed:    how much to take  how to take this  when to take this   nicotine 21 mg/24hr patch Commonly known as:  NICODERM CQ - dosed in mg/24 hours PLACE 1 PATCH ONTO THE SKIN DAILY What changed:  See the new instructions. Notes to patient:  Try to wean down the amount of nicotine you use.   ondansetron 4 MG disintegrating tablet Commonly known as:  ZOFRAN ODT Take 1 tablet (4 mg total) by mouth every 8 (eight) hours as needed for nausea or vomiting.   oxyCODONE 5 MG/5ML solution Commonly known as:  ROXICODONE Take 5 mLs (5 mg total) by mouth every 6 (six) hours as needed for moderate pain or severe pain.   pantoprazole 40 MG tablet Commonly known as:  PROTONIX take 1 tablet by mouth once daily What changed:    how much to take  how to take this  when to take this Notes to patient:  Take this medication EVERY DAY, even if you are not experiencing symptoms.    potassium chloride SA 20 MEQ tablet Commonly known as:  K-DUR,KLOR-CON Take 1 tablet (20 mEq total) by mouth daily.   vitamin B-12 100 MCG tablet Commonly known as:  CYANOCOBALAMIN Take 100 mcg by mouth daily.      Follow-up Information    Makayla Riley, MD. Go on 08/22/2017.   Specialty:  General Surgery Why:  at 1020 am Contact information: [address] [place] [address] [phone]        Makayla Riley, MD .   Specialty:  General Surgery Contact information: [address] [place] [address] [phone]            The results of significant diagnostics from this hospitalization (including imaging, microbiology, ancillary and laboratory) are listed below for reference.    Significant Diagnostic Studies: No results found.  Labs: Basic Metabolic  Panel: Recent Labs  Lab 07/26/17 0915 07/30/17 0513  NA 140 141  K 3.7 3.2*  CL 106 107  CO2 26 24  GLUCOSE 103* 102*  BUN 17 7  CREATININE 0.59 0.65  CALCIUM 9.3 8.9   Liver Function Tests: Recent Labs  Lab 07/26/17 0915 07/30/17 0513  AST 23 26  ALT 24 30  ALKPHOS 63 60  BILITOT 0.6 0.6  PROT 7.4 6.7  ALBUMIN 3.8 3.4*    CBC: Recent Labs  Lab 07/26/17 0915 07/30/17 0513  WBC 5.8 10.0  NEUTROABS 2.5 6.2  HGB 11.8* 10.7*  HCT 36.5 33.1*  MCV 89.9 88.7  PLT 250 213    CBG: No results for input(s): GLUCAP in the last 168  hours.  Active Problems:   Morbid obesity ([place])   Signed:  Clovis Good, [facility], [place] 07/30/2017, 9:19 PM

## 2017-07-30 NOTE — Progress Notes (Signed)
Patient alert and oriented, Post op day 1.  Provided support and encouragement.  Encouraged pulmonary toilet, ambulation and small sips of liquids.  Finished clear fluids started protein.  All questions answered.  Will continue to monitor.

## 2017-08-01 ENCOUNTER — Telehealth: Payer: Self-pay | Admitting: Family Medicine

## 2017-08-01 ENCOUNTER — Other Ambulatory Visit: Payer: Self-pay | Admitting: Family Medicine

## 2017-08-01 DIAGNOSIS — I10 Essential (primary) hypertension: Secondary | ICD-10-CM

## 2017-08-01 NOTE — Telephone Encounter (Signed)
Copied from [place] [id number]. Topic: Quick Communication - See Telephone Encounter >> Aug 01, 2017  1:59 PM Vernona Rieger wrote: CRM for notification. See Telephone encounter for: 08/01/17.  Patient wants to know if potassium chloride SA (K-DUR,KLOR-CON) 20 MEQ tablet can be changed to the liquid form. She had weight loss surgery on Monday and she can not swallow it. The pharmacy advised her to call her pcp.   [facility], [place] AT [place]

## 2017-08-01 NOTE — Telephone Encounter (Signed)
Patient requesting potassium chloride SA (K-DUR,KLOR-CON) 20 MEQ tablet  be changed to the liquid form. She had gastric sleeve surgery on Monday; unable to swallow tablet.   [facility] [place], [place] [place] AT [place]

## 2017-08-02 MED ORDER — POTASSIUM CHLORIDE 20 MEQ/15ML (10%) PO SOLN: 20.0000 meq | Freq: Every day | ORAL | 1 refills | Status: DC

## 2017-08-02 NOTE — Telephone Encounter (Signed)
Patient notified that rx was sent in for the liquid.

## 2017-08-05 ENCOUNTER — Telehealth (HOSPITAL_COMMUNITY): Payer: Self-pay

## 2017-08-05 ENCOUNTER — Encounter: Payer: Self-pay | Admitting: Family Medicine

## 2017-08-05 ENCOUNTER — Ambulatory Visit: Payer: BLUE CROSS/BLUE SHIELD | Admitting: Family Medicine

## 2017-08-05 VITALS — BP 108/70 | HR 83 | Temp 98.2°F | Resp 16 | Ht 65.0 in | Wt 253.2 lb

## 2017-08-05 DIAGNOSIS — E876 Hypokalemia: Secondary | ICD-10-CM | POA: Diagnosis not present

## 2017-08-05 DIAGNOSIS — I10 Essential (primary) hypertension: Secondary | ICD-10-CM | POA: Diagnosis not present

## 2017-08-05 NOTE — Assessment & Plan Note (Signed)
CHECK LABS TODAY

## 2017-08-05 NOTE — Progress Notes (Signed)
Patient ID: Makayla Good, female   DOB: 22-Oct-1970, 47 y.o.   MRN: [id number]     Subjective:  I acted as a Education administrator for Dr. Carollee Herter.  Guerry Bruin, [place]   Patient ID: Makayla Good, female    DOB: 12-18-1970, 47 y.o.   MRN: [id number]  Chief Complaint  Patient presents with  . Hypertension    HPI  Patient is in today for follow up blood pressure after having gastric surgery.  Her K was low in the hospital as well.  She has been unable to get the liquid k.  No complaints.    Patient Care Team: Carollee Herter, Alferd Apa, DO as PCP - General   Past Medical History:  Diagnosis Date  . Asthma   . Eczema   . H/O oophorectomy   . Hyperlipidemia   . Hypertension   . Obesity   . Polycystic ovary    Left  . Sleep apnea     Past Surgical History:  Procedure Laterality Date  . CESAREAN SECTION  02/01/2005  . CESAREAN SECTION  2001  . LAPAROSCOPIC GASTRIC SLEEVE RESECTION N/A 07/29/2017   Procedure: LAPAROSCOPIC GASTRIC SLEEVE RESECTION WITH UPPER ENDO ;  Surgeon: [name], MD;  Location: [facility];  Service: General;  Laterality: N/A;  . TUBAL LIGATION  02/01/2005    Family History  Problem Relation Age of Onset  . Hypertension Mother   . Schizophrenia Mother   . Dementia Mother   . Mental illness Mother        schizophrenia, dementia  . Hypertension Father   . Coronary artery disease Father        Stent  . Hypertension Sister   . Hypertension Sister   . Arthritis Sister        rheumatoid  . Dementia Maternal Aunt   . Kidney disease Unknown     Social History   Socioeconomic History  . Marital status: Married    Spouse name: Not on file  . Number of children: Not on file  . Years of education: 59  . Highest education level: Not on file  Occupational History  . Occupation: release Horticulturist, commercial: [employer]  Social Needs  . Financial resource strain: Not on file  . Food insecurity:    Worry: Sometimes true    Inability: Never true  .  Transportation needs:    Medical: Not on file    Non-medical: Not on file  Tobacco Use  . Smoking status: Former Smoker    Packs/day: 0.50    Years: 16.00    Pack years: 8.00    Types: Cigarettes    Start date: 03/23/1993    Last attempt to quit: 03/01/2017    Years since quitting: 0.4  . Smokeless tobacco: Never Used  . Tobacco comment: patch   Substance and Sexual Activity  . Alcohol use: Yes    Comment: occasional  . Drug use: No  . Sexual activity: Yes    Partners: Male    Comment: been with same person over 20 years  Lifestyle  . Physical activity:    Days per week: Not on file    Minutes per session: Not on file  . Stress: Not on file  Relationships  . Social connections:    Talks on phone: Not on file    Gets together: Not on file    Attends religious service: Not on file    Active member of club or organization:  Not on file    Attends meetings of clubs or organizations: Not on file    Relationship status: Not on file  . Intimate partner violence:    Fear of current or ex partner: Not on file    Emotionally abused: Not on file    Physically abused: Not on file    Forced sexual activity: Not on file  Other Topics Concern  . Not on file  Social History Narrative   Exercise--gym, every other day--- at least 3 days a week    Outpatient Medications Prior to Visit  Medication Sig Dispense Refill  . acetaminophen (TYLENOL) 160 MG/5ML solution Take 20.3 mLs (650 mg total) by mouth every 6 (six) hours. 120 mL 0  . albuterol (PROVENTIL HFA;VENTOLIN HFA) 108 (90 Base) MCG/ACT inhaler Inhale 2 puffs into the lungs every 6 (six) hours as needed for wheezing or shortness of breath. 1 Inhaler 2  . Calcium Citrate-Vitamin D (CALCIUM CITRATE + D PO) Take 1 each by mouth daily.    . Multiple Vitamins-Minerals (CELEBRATE MULTI-COMPLETE 36 PO) Take 1 each by mouth daily.    . nicotine (NICODERM CQ - DOSED IN MG/24 HOURS) 21 mg/24hr patch PLACE 1 PATCH ONTO THE SKIN DAILY (Patient  taking differently: PLACE 1 PATCH ONTO THE SKIN DAILY AS NEEDED FOR NICOTINE) 28 patch 0  . oxyCODONE (ROXICODONE) 5 MG/5ML solution Take 5 mLs (5 mg total) by mouth every 6 (six) hours as needed for moderate pain or severe pain. 125 mL 0  . pantoprazole (PROTONIX) 40 MG tablet take 1 tablet by mouth once daily (Patient taking differently: Take 40 mg by mouth once daily) 90 tablet 1  . potassium chloride 20 MEQ/15ML (10%) SOLN Take 15 mLs (20 mEq total) by mouth daily. 450 mL 1  . vitamin B-12 (CYANOCOBALAMIN) 100 MCG tablet Take 100 mcg by mouth daily.    . ondansetron (ZOFRAN ODT) 4 MG disintegrating tablet Take 1 tablet (4 mg total) by mouth every 8 (eight) hours as needed for nausea or vomiting. (Patient not taking: Reported on 08/05/2017) 25 tablet 0  . amLODipine (NORVASC) 10 MG tablet take 1 tablet by mouth once daily (Patient not taking: Reported on 08/05/2017) 90 tablet 1  . hydrochlorothiazide (HYDRODIURIL) 25 MG tablet Take 1 tablet (25 mg total) by mouth daily. (Patient not taking: Reported on 08/05/2017) 90 tablet 0  . metoprolol succinate (TOPROL-XL) 25 MG 24 hr tablet Take 25 mg by mouth once daily (Patient not taking: Reported on 08/05/2017) 90 tablet 0   No facility-administered medications prior to visit.     No Known Allergies  Review of Systems  Constitutional: Negative for fever and malaise/fatigue.  HENT: Negative for congestion.   Eyes: Negative for blurred vision.  Respiratory: Negative for cough and shortness of breath.   Cardiovascular: Negative for chest pain, palpitations and leg swelling.  Gastrointestinal: Negative for vomiting.  Musculoskeletal: Negative for back pain.  Skin: Negative for rash.  Neurological: Negative for loss of consciousness and headaches.       Objective:    Physical Exam  Constitutional: She is oriented to person, place, and time. She appears well-developed and well-nourished.  HENT:  Head: Normocephalic and atraumatic.  Eyes:  Conjunctivae and EOM are normal.  Neck: Normal range of motion. Neck supple. No JVD present. Carotid bruit is not present. No thyromegaly present.  Cardiovascular: Normal rate, regular rhythm and normal heart sounds.  No murmur heard. Pulmonary/Chest: Effort normal and breath sounds normal. No respiratory distress.  She has no wheezes. She has no rales. She exhibits no tenderness.  Musculoskeletal: She exhibits no edema.  Neurological: She is alert and oriented to person, place, and time.  Psychiatric: She has a normal mood and affect.  Nursing note and vitals reviewed.   BP 108/70 (BP Location: Left Arm, Cuff Size: Large)   Pulse 83   Temp 98.2 F (36.8 C) (Oral)   Resp 16   Ht 5\' 5"  (1.651 m)   Wt 253 lb 3.2 oz (114.9 kg)   LMP 07/26/2017 (Exact Date)   SpO2 98%   BMI 42.13 kg/m  Wt Readings from Last 3 Encounters:  08/05/17 253 lb 3.2 oz (114.9 kg)  07/29/17 260 lb (117.9 kg)  07/26/17 258 lb 8 oz (117.3 kg)   BP Readings from Last 3 Encounters:  08/05/17 108/70  07/30/17 135/88  07/26/17 129/87     Immunization History  Administered Date(s) Administered  . Influenza Nasal 01/21/2012  . Influenza, Seasonal, Injecte, Preservative Fre 01/31/2015  . Influenza,inj,Quad PF,6+ Mos 02/11/2013  . Influenza-Unspecified 01/02/2016, 12/28/2016  . Pneumococcal Polysaccharide-23 05/14/2011  . Tdap 09/04/2010    Health Maintenance  Topic Date Due  . INFLUENZA VACCINE  11/21/2017  . MAMMOGRAM  11/23/2017  . PAP SMEAR  01/23/2019  . TETANUS/TDAP  09/03/2021  . HIV Screening  Completed    Lab Results  Component Value Date   WBC 10.0 07/30/2017   HGB 10.7 (L) 07/30/2017   HCT 33.1 (L) 07/30/2017   PLT 213 07/30/2017   GLUCOSE 102 (H) 07/30/2017   CHOL 155 02/07/2017   TRIG 96.0 02/07/2017   HDL 38.50 (L) 02/07/2017   LDLCALC 97 02/07/2017   ALT 30 07/30/2017   AST 26 07/30/2017   NA 141 07/30/2017   K 3.2 (L) 07/30/2017   CL 107 07/30/2017   CREATININE 0.65  07/30/2017   BUN 7 07/30/2017   CO2 24 07/30/2017   TSH 2.03 02/07/2017   HGBA1C 6.5 07/01/2015   MICROALBUR <0.7 01/23/2016    Lab Results  Component Value Date   TSH 2.03 02/07/2017   Lab Results  Component Value Date   WBC 10.0 07/30/2017   HGB 10.7 (L) 07/30/2017   HCT 33.1 (L) 07/30/2017   MCV 88.7 07/30/2017   PLT 213 07/30/2017   Lab Results  Component Value Date   NA 141 07/30/2017   K 3.2 (L) 07/30/2017   CO2 24 07/30/2017   GLUCOSE 102 (H) 07/30/2017   BUN 7 07/30/2017   CREATININE 0.65 07/30/2017   BILITOT 0.6 07/30/2017   ALKPHOS 60 07/30/2017   AST 26 07/30/2017   ALT 30 07/30/2017   PROT 6.7 07/30/2017   ALBUMIN 3.4 (L) 07/30/2017   CALCIUM 8.9 07/30/2017   ANIONGAP 10 07/30/2017   GFR 115.73 02/07/2017   Lab Results  Component Value Date   CHOL 155 02/07/2017   Lab Results  Component Value Date   HDL 38.50 (L) 02/07/2017   Lab Results  Component Value Date   LDLCALC 97 02/07/2017   Lab Results  Component Value Date   TRIG 96.0 02/07/2017   Lab Results  Component Value Date   CHOLHDL 4 02/07/2017   Lab Results  Component Value Date   HGBA1C 6.5 07/01/2015         Assessment & Plan:   Problem List Items Addressed This Visit      Unprioritized   Benign essential HTN    STABLE  --- OFF MEDS RECHECK 3 MONTHS  Essential hypertension   Hypokalemia - Primary    CHECK LABS TODAY      Relevant Orders   Basic metabolic panel   Morbid obesity ([place])    S/P GASTRIC SLEEVE PER SURGERY         I have discontinued [place] [facility] Haugh[facility]'s hydrochlorothiazide, amLODipine, and metoprolol succinate. I am also having her maintain her albuterol, vitamin B-12, pantoprazole, nicotine, Multiple Vitamins-Minerals (CELEBRATE MULTI-COMPLETE 36 PO), Calcium Citrate-Vitamin D (CALCIUM CITRATE + D PO), acetaminophen, oxyCODONE, ondansetron, and potassium chloride.  No orders of the defined types were placed in this encounter.   CMA  served as Education administrator during this visit. History, Physical and Plan performed by medical provider. Documentation and orders reviewed and attested to.  [name], DO

## 2017-08-05 NOTE — Patient Instructions (Signed)

## 2017-08-05 NOTE — Telephone Encounter (Signed)
Patient called to discuss post bariatric surgery follow up questions.  See below:   1.  Tell me about your pain and pain management?has had little pain, has not needed medication for pain  2.  Let's talk about fluid intake.  How much total fluid are you taking in?54 ounces of fluid  3.  How much protein have you taken in the last 2 days?45 grams of protein  4.  Have you had nausea?  Tell me about when have experienced nausea and what you did to help? Some nausea but has not need Zofran 5.  Has the frequency or color changed with your urine?frequent urination light in color  6.  Tell me what your incisions look like?no problems, steri strips in place  7.  Have you been passing gas? BM?passing gas with daily bms  8.  If a problem or question were to arise who would you call?  Do you know contact numbers for [facility], [facility], and [facility]?is aware of how to contact all services  9.  How has the walking going?ambulating regularly  10.  How are your vitamins and calcium going?  How are you taking them?vitamin scheduled discussed with calcium schedule.  Patient takes celebrate product for both without problems  Patient has appointment with PCP this afternoon.  She has been keeping records of BP and heart rate.

## 2017-08-05 NOTE — Assessment & Plan Note (Signed)
S/P GASTRIC SLEEVE PER SURGERY

## 2017-08-05 NOTE — Assessment & Plan Note (Signed)
STABLE  --- OFF MEDS RECHECK 3 MONTHS

## 2017-08-06 LAB — BASIC METABOLIC PANEL
BUN: 9 mg/dL (ref 6–23)
CO2: 26 mEq/L (ref 19–32)
Calcium: 9.5 mg/dL (ref 8.4–10.5)
Chloride: 104 mEq/L (ref 96–112)
Creatinine, Ser: 0.68 mg/dL (ref 0.40–1.20)
GFR: 119.41 mL/min (ref >60.00)
Glucose, Bld: 86 mg/dL (ref 70–99)
Potassium: 4.2 mEq/L (ref 3.5–5.1)
Sodium: 141 mEq/L (ref 135–145)

## 2017-08-13 ENCOUNTER — Encounter: Payer: BLUE CROSS/BLUE SHIELD | Attending: Surgery | Admitting: Registered"

## 2017-08-13 DIAGNOSIS — Z713 Dietary counseling and surveillance: Secondary | ICD-10-CM | POA: Insufficient documentation

## 2017-08-13 DIAGNOSIS — Z9884 Bariatric surgery status: Secondary | ICD-10-CM | POA: Insufficient documentation

## 2017-08-13 DIAGNOSIS — E669 Obesity, unspecified: Secondary | ICD-10-CM

## 2017-08-13 DIAGNOSIS — Z6841 Body Mass Index (BMI) 40.0 and over, adult: Secondary | ICD-10-CM | POA: Diagnosis not present

## 2017-08-13 NOTE — Progress Notes (Signed)
Bariatric Class:  Appt start time: 1530 end time:  1630.  2 Week Post-Operative Nutrition Class  Patient was seen on 08/13/2017 for Post[facility]ative Nutrition [facility].   Surgery date: 07/29/2017 Surgery type: Sleeve  Start weight at [facility]: 273.4 Weight today: 249.4 Weight change: 24  Pt states she has experienced some nausea/vomiting/diarrhea/constipation/gas and consuming about 40 ounces of fluid a day.   TANITA  BODY COMP RESULTS  08/13/2017   BMI (kg/m^2) 41.5   Fat Mass (lbs) 110.8   Fat Free Mass (lbs) 138.6   Total Body Water (lbs) 101.4   The following the learning objectives were met by the patient during this course:  Identifies Phase 3A (Soft, High Proteins) Dietary Goals and will begin from 2 weeks post-operatively to 2 months post-operatively  Identifies appropriate sources of fluids and proteins   States protein recommendations and appropriate sources post-operatively  Identifies the need for appropriate texture modifications, mastication, and bite sizes when consuming solids  Identifies appropriate multivitamin and calcium sources post-operatively  Describes the need for physical activity post-operatively and will follow MD recommendations  States when to call healthcare provider regarding medication questions or post-operative complications  Handouts given during class include:  Phase 3A: Soft, High Protein Diet Handout  Follow-Up Plan: Patient will follow-up at [facility] in 6 weeks for 2 month post-op nutrition visit for diet advancement per MD.

## 2017-08-16 ENCOUNTER — Inpatient Hospital Stay: Admit: 2017-08-16 | Payer: BLUE CROSS/BLUE SHIELD | Admitting: Surgery

## 2017-08-16 SURGERY — GASTRECTOMY, SLEEVE, LAPAROSCOPIC
Anesthesia: General

## 2017-08-20 ENCOUNTER — Telehealth: Payer: Self-pay | Admitting: Registered"

## 2017-08-20 NOTE — Telephone Encounter (Signed)
RD called pt to verify fluid intake once restarting soft, solid proteins 2 week post-bariatric surgery. Pt was unavailable; left voicemail message.

## 2017-08-24 DIAGNOSIS — G4733 Obstructive sleep apnea (adult) (pediatric): Secondary | ICD-10-CM | POA: Diagnosis not present

## 2017-08-30 ENCOUNTER — Encounter: Payer: Self-pay | Admitting: Pulmonary Disease

## 2017-08-30 ENCOUNTER — Ambulatory Visit (INDEPENDENT_AMBULATORY_CARE_PROVIDER_SITE_OTHER): Payer: BLUE CROSS/BLUE SHIELD | Admitting: Pulmonary Disease

## 2017-08-30 DIAGNOSIS — G4733 Obstructive sleep apnea (adult) (pediatric): Secondary | ICD-10-CM | POA: Diagnosis not present

## 2017-08-30 DIAGNOSIS — J4520 Mild intermittent asthma, uncomplicated: Secondary | ICD-10-CM

## 2017-08-30 NOTE — Assessment & Plan Note (Signed)
Mild intermittent. Controlled

## 2017-08-30 NOTE — Assessment & Plan Note (Signed)
If weight drops less than 220 pounds call us to schedule home sleep study to reassess need for CPAP

## 2017-08-30 NOTE — Patient Instructions (Signed)
Prescription for new nasal pillows will be sent to DME. Try to be more consistent with your CPAP machine and is using it every night.  If weight drops less than 220 pounds call us to schedule home sleep study to reassess need for CPAP

## 2017-08-30 NOTE — Assessment & Plan Note (Signed)
Prescription for new nasal pillows will be sent to DME. Try to be more consistent with your CPAP machine and is using it every night.  Weight loss encouraged, compliance with goal of at least 4-6 hrs every night is the expectation. Advised against medications with sedative side effects Cautioned against driving when sleepy - understanding that sleepiness will vary on a day to day basis

## 2017-08-30 NOTE — Progress Notes (Signed)
   Subjective:    Patient ID: Makayla Good, female    DOB: 13-Nov-1970, 48 y.o.   MRN: [id number]  HPI  47 year old female with moderate obstructive sleep apnea started on CPAP November 2018 She underwent gastric sleeve 07/2017 and has lost about 25 pounds from 270 down to 249. CPAP is helped her and she reports good improvement in the daytime somnolence and fatigue. No problems with mask or pressure.  No problems with dryness of mouth or nasal passages. Download shows good control of events on 9 cm with acceptable compliance but few in the most nights. Minimal leak.  She reports that for the last 2 nights she has not used the machine because she had the machine running for a long time 1 a day and the mask has a funny smell.  Asthma is well controlled. She has not used needed to use albuterol.  She has finally quit smoking  Significant tests/ events reviewed  NP SG 02/2016 showed AHI of 15/hour with a REM related AHI of 68/hour  Review of Systems Patient denies significant dyspnea,cough, hemoptysis,  chest pain, palpitations, pedal edema, orthopnea, paroxysmal nocturnal dyspnea, lightheadedness, nausea, vomiting, abdominal or  leg pains      Objective:   Physical Exam  Gen. Pleasant, obese, in no distress ENT - no lesions, no post nasal drip Neck: No JVD, no thyromegaly, no carotid bruits Lungs: no use of accessory muscles, no dullness to percussion, decreased without rales or rhonchi  Cardiovascular: Rhythm regular, heart sounds  normal, no murmurs or gallops, no peripheral edema Musculoskeletal: No deformities, no cyanosis or clubbing , no tremors       Assessment & Plan:

## 2017-09-24 ENCOUNTER — Encounter: Payer: Self-pay | Admitting: Registered"

## 2017-09-24 ENCOUNTER — Encounter: Payer: BLUE CROSS/BLUE SHIELD | Attending: Surgery | Admitting: Registered"

## 2017-09-24 DIAGNOSIS — Z713 Dietary counseling and surveillance: Secondary | ICD-10-CM | POA: Insufficient documentation

## 2017-09-24 DIAGNOSIS — Z6841 Body Mass Index (BMI) 40.0 and over, adult: Secondary | ICD-10-CM | POA: Insufficient documentation

## 2017-09-24 DIAGNOSIS — Z9884 Bariatric surgery status: Secondary | ICD-10-CM | POA: Diagnosis not present

## 2017-09-24 DIAGNOSIS — G4733 Obstructive sleep apnea (adult) (pediatric): Secondary | ICD-10-CM | POA: Diagnosis not present

## 2017-09-24 DIAGNOSIS — E669 Obesity, unspecified: Secondary | ICD-10-CM

## 2017-09-24 NOTE — Patient Instructions (Addendum)
Goals:  Follow Phase 3B: High Protein + Non-Starchy Vegetables  Eat 3-6 small meals/snacks, every 3-5 hrs  Increase lean protein foods to meet 60g goal  Increase fluid intake to 64oz +  Avoid drinking 15 minutes before, during and 30 minutes after eating  Aim for >30 min of physical activity daily  Track protein intake.   Try to attend support group. See handout.  Plan meals ahead on the weekend for upcoming workweek.

## 2017-09-24 NOTE — Progress Notes (Signed)
Follow-up visit:  8 Weeks Post-Operative Sleeve Gastrectomy Surgery  Medical Nutrition Therapy:  Appt start time: 3:40 end time:  4:30.  Primary concerns today: Post-operative Bariatric Surgery Nutrition Management.  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 Pt states she eats tuna, sardines, shrimp, and tilapia well. Pt states she has bowel movements every 2-3 days. Pt reports she feels like she might be doing something wrong because she cannot tolerate some foods.   Pt states she chews and spits at times just to get the taste of the food. Pt states she does not want to vomit therefore does not swallow some food that she knows she has not tolerated in the past. Pt states she feels bad sometimes and this is tough. Pt states this is a journey and she is still learning.   Preferred Learning Style:   No preference indicated   Learning Readiness:   Ready  Change in progress  24-hr recall: B (AM): protein shake (15g) Snk (AM): greek yogurt (15g)  L (PM): tuna (15g) Snk (PM): cheese (6g) + protein2o (15g) D (PM): tuna (15g) Snk (PM): none  Fluid intake: water, protein20, protein shakes, coffee; 64+ oz Estimated total protein intake: 60+ grams  Medications: See list Supplementation: Celebrate + calcium supplements  Using straws: no Drinking  while eating: no Having you been chewing well: yes Chewing/swallowing difficulties: no Changes in vision: no Changes to mood/headaches: sometimes Hair loss/Changes to skin/Changes to nails: no, no, no Any difficulty focusing or concentrating: no Sweating: no Dizziness/Lightheaded: no Palpitations: no  Carbonated beverages: no N/V/D/C/GAS: no, no, sometimes, sometimes, no Abdominal Pain: no Dumping syndrome: no Last Lap-Band fill: N/A  Recent physical activity:  Cardio and weight training 5 days/week, 20-30 min  Progress Towards Goal(s):  In progress.  Handouts given during visit include:  Phase 3B: High protein + non-starchy vegetables   Nutritional Diagnosis:  [place]-3.3 Overweight/obesity related to past poor dietary habits and physical inactivity as evidenced by patient w/ recent sleeve gastrectomy surgery following dietary guidelines for continued weight loss.     Intervention:  Nutrition education and counseling. Pt was educated and counseled on how to track protein intake, trying new food items on the weekends, and embracing her journey. Pt was encouraged to keep up the great work especially with meeting fluid goals and having physical activity most days of the week.  Goals:  Follow Phase 3B: High Protein + Non-Starchy Vegetables  Eat 3-6 small meals/snacks, every 3-5 hrs  Increase lean protein foods to meet 60g goal  Increase fluid intake to 64oz +  Avoid drinking 15 minutes before, during and 30 minutes after eating  Aim for >30 min of physical activity daily  Track protein intake.   Try to attend support group. See handout.  Plan meals ahead on the weekend for upcoming workweek.  Teaching Method Utilized:  Visual Auditory Hands on  Barriers to learning/adherence to lifestyle change: none identified  Demonstrated degree of understanding via:  Teach Back   Monitoring/Evaluation:  Dietary intake, exercise, lap band fills, and body weight. Follow up in 2  months for 4 month post-op visit.

## 2017-09-26 ENCOUNTER — Ambulatory Visit (INDEPENDENT_AMBULATORY_CARE_PROVIDER_SITE_OTHER): Payer: BLUE CROSS/BLUE SHIELD | Admitting: Family Medicine

## 2017-09-26 ENCOUNTER — Encounter: Payer: Self-pay | Admitting: Family Medicine

## 2017-09-26 VITALS — BP 138/88 | HR 77 | Temp 98.4°F | Resp 16 | Ht 65.5 in | Wt 235.0 lb

## 2017-09-26 DIAGNOSIS — I10 Essential (primary) hypertension: Secondary | ICD-10-CM

## 2017-09-26 DIAGNOSIS — R600 Localized edema: Secondary | ICD-10-CM | POA: Diagnosis not present

## 2017-09-26 MED ORDER — HYDROCHLOROTHIAZIDE 25 MG PO TABS: 25.0000 mg | ORAL_TABLET | Freq: Every day | ORAL | 3 refills | Status: DC

## 2017-09-26 NOTE — Assessment & Plan Note (Signed)
Well controlled, no changes to meds. Encouraged heart healthy diet such as the DASH diet and exercise as tolerated.  °

## 2017-09-26 NOTE — Patient Instructions (Signed)
DASH Eating Plan DASH stands for "Dietary Approaches to Stop Hypertension." The DASH eating plan is a healthy eating plan that has been shown to reduce high blood pressure (hypertension). It may also reduce your risk for type 2 diabetes, heart disease, and stroke. The DASH eating plan may also help with weight loss. What are tips for following this plan? General guidelines  Avoid eating more than 2,300 mg (milligrams) of salt (sodium) a day. If you have hypertension, you may need to reduce your sodium intake to 1,500 mg a day.  Limit alcohol intake to no more than 1 drink a day for nonpregnant women and 2 drinks a day for men. One drink equals 12 oz of beer, 5 oz of wine, or 1 oz of hard liquor.  Work with your health care provider to maintain a healthy body weight or to lose weight. Ask what an ideal weight is for you.  Get at least 30 minutes of exercise that causes your heart to beat faster (aerobic exercise) most days of the week. Activities may include walking, swimming, or biking.  Work with your health care provider or diet and nutrition specialist (dietitian) to adjust your eating plan to your individual calorie needs. Reading food labels  Check food labels for the amount of sodium per serving. Choose foods with less than 5 percent of the Daily Value of sodium. Generally, foods with less than 300 mg of sodium per serving fit into this eating plan.  To find whole grains, look for the word "whole" as the first word in the ingredient list. Shopping  Buy products labeled as "low-sodium" or "no salt added."  Buy fresh foods. Avoid canned foods and premade or frozen meals. Cooking  Avoid adding salt when cooking. Use salt-free seasonings or herbs instead of table salt or sea salt. Check with your health care provider or pharmacist before using salt substitutes.  Do not fry foods. Cook foods using healthy methods such as baking, boiling, grilling, and broiling instead.  Cook with  heart-healthy oils, such as olive, canola, soybean, or sunflower oil. Meal planning   Eat a balanced diet that includes: ? 5 or more servings of fruits and vegetables each day. At each meal, try to fill half of your plate with fruits and vegetables. ? Up to 6-8 servings of whole grains each day. ? Less than 6 oz of lean meat, poultry, or fish each day. A 3-oz serving of meat is about the same size as a deck of cards. One egg equals 1 oz. ? 2 servings of low-fat dairy each day. ? A serving of nuts, seeds, or beans 5 times each week. ? Heart-healthy fats. Healthy fats called Omega-3 fatty acids are found in foods such as flaxseeds and coldwater fish, like sardines, salmon, and mackerel.  Limit how much you eat of the following: ? Canned or prepackaged foods. ? Food that is high in trans fat, such as fried foods. ? Food that is high in saturated fat, such as fatty meat. ? Sweets, desserts, sugary drinks, and other foods with added sugar. ? Full-fat dairy products.  Do not salt foods before eating.  Try to eat at least 2 vegetarian meals each week.  Eat more home-cooked food and less restaurant, buffet, and fast food.  When eating at a restaurant, ask that your food be prepared with less salt or no salt, if possible. What foods are recommended? The items listed may not be a complete list. Talk with your dietitian about what   dietary choices are best for you. Grains Whole-grain or whole-wheat bread. Whole-grain or whole-wheat pasta. Brown rice. Oatmeal. Quinoa. Bulgur. Whole-grain and low-sodium cereals. Pita bread. Low-fat, low-sodium crackers. Whole-wheat flour tortillas. Vegetables Fresh or frozen vegetables (raw, steamed, roasted, or grilled). Low-sodium or reduced-sodium tomato and vegetable juice. Low-sodium or reduced-sodium tomato sauce and tomato paste. Low-sodium or reduced-sodium canned vegetables. Fruits All fresh, dried, or frozen fruit. Canned fruit in natural juice (without  added sugar). Meat and other protein foods Skinless chicken or turkey. Ground chicken or turkey. Pork with fat trimmed off. Fish and seafood. Egg whites. Dried beans, peas, or lentils. Unsalted nuts, nut butters, and seeds. Unsalted canned beans. Lean cuts of beef with fat trimmed off. Low-sodium, lean deli meat. Dairy Low-fat (1%) or fat-free (skim) milk. Fat-free, low-fat, or reduced-fat cheeses. Nonfat, low-sodium ricotta or cottage cheese. Low-fat or nonfat yogurt. Low-fat, low-sodium cheese. Fats and oils Soft margarine without trans fats. Vegetable oil. Low-fat, reduced-fat, or light mayonnaise and salad dressings (reduced-sodium). Canola, safflower, olive, soybean, and sunflower oils. Avocado. Seasoning and other foods Herbs. Spices. Seasoning mixes without salt. Unsalted popcorn and pretzels. Fat-free sweets. What foods are not recommended? The items listed may not be a complete list. Talk with your dietitian about what dietary choices are best for you. Grains Baked goods made with fat, such as croissants, muffins, or some breads. Dry pasta or rice meal packs. Vegetables Creamed or fried vegetables. Vegetables in a cheese sauce. Regular canned vegetables (not low-sodium or reduced-sodium). Regular canned tomato sauce and paste (not low-sodium or reduced-sodium). Regular tomato and vegetable juice (not low-sodium or reduced-sodium). Pickles. Olives. Fruits Canned fruit in a light or heavy syrup. Fried fruit. Fruit in cream or butter sauce. Meat and other protein foods Fatty cuts of meat. Ribs. Fried meat. Bacon. Sausage. Bologna and other processed lunch meats. Salami. Fatback. Hotdogs. Bratwurst. Salted nuts and seeds. Canned beans with added salt. Canned or smoked fish. Whole eggs or egg yolks. Chicken or turkey with skin. Dairy Whole or 2% milk, cream, and half-and-half. Whole or full-fat cream cheese. Whole-fat or sweetened yogurt. Full-fat cheese. Nondairy creamers. Whipped toppings.  Processed cheese and cheese spreads. Fats and oils Butter. Stick margarine. Lard. Shortening. Ghee. Bacon fat. Tropical oils, such as coconut, palm kernel, or palm oil. Seasoning and other foods Salted popcorn and pretzels. Onion salt, garlic salt, seasoned salt, table salt, and sea salt. Worcestershire sauce. Tartar sauce. Barbecue sauce. Teriyaki sauce. Soy sauce, including reduced-sodium. Steak sauce. Canned and packaged gravies. Fish sauce. Oyster sauce. Cocktail sauce. Horseradish that you find on the shelf. Ketchup. Mustard. Meat flavorings and tenderizers. Bouillon cubes. Hot sauce and Tabasco sauce. Premade or packaged marinades. Premade or packaged taco seasonings. Relishes. Regular salad dressings. Where to find more information:  National Heart, Lung, and Blood Institute: www.nhlbi.nih.gov  American Heart Association: www.heart.org Summary  The DASH eating plan is a healthy eating plan that has been shown to reduce high blood pressure (hypertension). It may also reduce your risk for type 2 diabetes, heart disease, and stroke.  With the DASH eating plan, you should limit salt (sodium) intake to 2,300 mg a day. If you have hypertension, you may need to reduce your sodium intake to 1,500 mg a day.  When on the DASH eating plan, aim to eat more fresh fruits and vegetables, whole grains, lean proteins, low-fat dairy, and heart-healthy fats.  Work with your health care provider or diet and nutrition specialist (dietitian) to adjust your eating plan to your individual   calorie needs. This information is not intended to replace advice given to you by your health care provider. Make sure you discuss any questions you have with your health care provider. Document Released: 03/29/2011 Document Revised: 04/02/2016 Document Reviewed: 04/02/2016 Elsevier Interactive Patient Education  2018 Elsevier Inc.  

## 2017-09-26 NOTE — Progress Notes (Signed)
Check labs.  Adjust meds prnCheck labs.  Adjust meds prnCheck labs.  Adjust meds prnCheck labs.  Adjust meds prn Patient ID: Makayla Good, female    DOB: March 02, 1971  Age: 47 y.o. MRN: [id number]    Subjective:  Subjective  HPI Makayla Good presents for bp f/u.  bp has been running high.  She has been having headaches as well.  No other complaints.  No cp, no sob.    Review of Systems  Constitutional: Negative for activity change, appetite change, fatigue, fever and unexpected weight change.  HENT: Negative for congestion.   Respiratory: Negative for cough and shortness of breath.   Cardiovascular: Positive for leg swelling. Negative for chest pain and palpitations.  Gastrointestinal: Negative for abdominal pain, blood in stool and nausea.  Genitourinary: Negative for dysuria and frequency.  Skin: Negative for rash.  Allergic/Immunologic: Negative for environmental allergies.  Neurological: Positive for headaches. Negative for dizziness.  Psychiatric/Behavioral: Negative for behavioral problems and dysphoric mood. The patient is not nervous/anxious.     History Past Medical History:  Diagnosis Date  . Asthma   . Eczema   . H/O oophorectomy   . Hyperlipidemia   . Hypertension   . Obesity   . Polycystic ovary    Left  . Sleep apnea     She has a past surgical history that includes Cesarean section (02/01/2005); Tubal ligation (02/01/2005); Cesarean section (2001); and Laparoscopic gastric sleeve resection (N/A, 07/29/2017).   Her family history includes Arthritis in her sister; Coronary artery disease in her father; Dementia in her maternal aunt and mother; Hypertension in her father, mother, sister, and sister; Kidney disease in her unknown relative; Mental illness in her mother; Schizophrenia in her mother.She reports that she quit smoking about 6 months ago. Her smoking use included cigarettes. She started smoking about 24 years ago. She has a 8.00 pack-year smoking  history. She has never used smokeless tobacco. She reports that she drinks alcohol. She reports that she does not use drugs.  Current Outpatient Medications on File Prior to Visit  Medication Sig Dispense Refill  . albuterol (PROVENTIL HFA;VENTOLIN HFA) 108 (90 Base) MCG/ACT inhaler Inhale 2 puffs into the lungs every 6 (six) hours as needed for wheezing or shortness of breath. 1 Inhaler 2  . Calcium Citrate-Vitamin D (CALCIUM CITRATE + D PO) Take 1 each by mouth daily.    . Multiple Vitamins-Minerals (CELEBRATE MULTI-COMPLETE 36 PO) Take 1 each by mouth daily.    . pantoprazole (PROTONIX) 40 MG tablet take 1 tablet by mouth once daily (Patient taking differently: Take 40 mg by mouth once daily) 90 tablet 1  . vitamin B-12 (CYANOCOBALAMIN) 100 MCG tablet Take 100 mcg by mouth daily.     No current facility-administered medications on file prior to visit.      Objective:  Objective  Physical Exam  Constitutional: She is oriented to person, place, and time. She appears well-developed and well-nourished.  HENT:  Head: Normocephalic and atraumatic.  Eyes: Conjunctivae and EOM are normal.  Neck: Normal range of motion. Neck supple. No JVD present. Carotid bruit is not present. No thyromegaly present.  Cardiovascular: Normal rate, regular rhythm and normal heart sounds.  No murmur heard. Pulmonary/Chest: Effort normal and breath sounds normal. No respiratory distress. She has no wheezes. She has no rales. She exhibits no tenderness.  Musculoskeletal: She exhibits edema.  Tr pitting edema b/l low ext  Neurological: She is alert and oriented to person, place, and time.  Psychiatric: She has a normal mood and affect.  Nursing note and vitals reviewed.  BP 138/88 (BP Location: Left Arm, Cuff Size: Large)   Pulse 77   Temp 98.4 F (36.9 C) (Oral)   Resp 16   Ht 5' 5.5" (1.664 m)   Wt 235 lb (106.6 kg)   LMP 09/22/2017   SpO2 98%   BMI 38.51 kg/m  Wt Readings from Last 3 Encounters:    09/26/17 235 lb (106.6 kg)  09/24/17 235 lb (106.6 kg)  08/30/17 249 lb 4.8 oz (113.1 kg)     Lab Results  Component Value Date   WBC 10.0 07/30/2017   HGB 10.7 (L) 07/30/2017   HCT 33.1 (L) 07/30/2017   PLT 213 07/30/2017   GLUCOSE 86 08/05/2017   CHOL 155 02/07/2017   TRIG 96.0 02/07/2017   HDL 38.50 (L) 02/07/2017   LDLCALC 97 02/07/2017   ALT 30 07/30/2017   AST 26 07/30/2017   NA 141 08/05/2017   K 4.2 08/05/2017   CL 104 08/05/2017   CREATININE 0.68 08/05/2017   BUN 9 08/05/2017   CO2 26 08/05/2017   TSH 2.03 02/07/2017   HGBA1C 6.5 07/01/2015   MICROALBUR <0.7 01/23/2016    No results found.   Assessment & Plan:  Plan  I am having Brendia R. Bertini start on hydrochlorothiazide. I am also having her maintain her albuterol, vitamin B-12, pantoprazole, Multiple Vitamins-Minerals (CELEBRATE MULTI-COMPLETE 36 PO), and Calcium Citrate-Vitamin D (CALCIUM CITRATE + D PO).  Meds ordered this encounter  Medications  . hydrochlorothiazide (HYDRODIURIL) 25 MG tablet    Sig: Take 1 tablet (25 mg total) by mouth daily.    Dispense:  90 tablet    Refill:  3    Problem List Items Addressed This Visit      Unprioritized   Essential hypertension - Primary    Well controlled, no changes to meds. Encouraged heart healthy diet such as the DASH diet and exercise as tolerated.       Relevant Medications   hydrochlorothiazide (HYDRODIURIL) 25 MG tablet   Other Relevant Orders   Basic metabolic panel   Basic metabolic panel    Other Visit Diagnoses    Lower extremity edema       Relevant Medications   hydrochlorothiazide (HYDRODIURIL) 25 MG tablet   Other Relevant Orders   Basic metabolic panel   Basic metabolic panel    elevate legs  Drink plenty of water Eat / drink K rich foods Check labs today and in 2-3 weeks   Follow-up: Return in about 3 weeks (around 10/17/2017) for bp check and lab.  [name], DO

## 2017-09-27 LAB — BASIC METABOLIC PANEL
BUN: 13 mg/dL (ref 6–23)
CO2: 24 mEq/L (ref 19–32)
Calcium: 9.7 mg/dL (ref 8.4–10.5)
Chloride: 105 mEq/L (ref 96–112)
Creatinine, Ser: 0.77 mg/dL (ref 0.40–1.20)
GFR: 103.39 mL/min (ref >60.00)
Glucose, Bld: 91 mg/dL (ref 70–99)
Potassium: 3.9 mEq/L (ref 3.5–5.1)
Sodium: 140 mEq/L (ref 135–145)

## 2017-10-10 ENCOUNTER — Other Ambulatory Visit: Payer: Self-pay | Admitting: Family Medicine

## 2017-10-10 DIAGNOSIS — K219 Gastro-esophageal reflux disease without esophagitis: Secondary | ICD-10-CM

## 2017-10-14 ENCOUNTER — Other Ambulatory Visit: Payer: Self-pay | Admitting: Family Medicine

## 2017-10-14 DIAGNOSIS — Z1231 Encounter for screening mammogram for malignant neoplasm of breast: Secondary | ICD-10-CM

## 2017-10-24 DIAGNOSIS — G4733 Obstructive sleep apnea (adult) (pediatric): Secondary | ICD-10-CM | POA: Diagnosis not present

## 2017-10-27 ENCOUNTER — Other Ambulatory Visit: Payer: Self-pay | Admitting: Family Medicine

## 2017-10-27 DIAGNOSIS — I10 Essential (primary) hypertension: Secondary | ICD-10-CM

## 2017-11-24 DIAGNOSIS — G4733 Obstructive sleep apnea (adult) (pediatric): Secondary | ICD-10-CM | POA: Diagnosis not present

## 2017-11-25 ENCOUNTER — Ambulatory Visit
Admission: RE | Admit: 2017-11-25 | Discharge: 2017-11-25 | Disposition: A | Discharge status: Home / Self Care | Payer: BLUE CROSS/BLUE SHIELD | Source: Ambulatory Visit | Attending: Family Medicine | Admitting: Family Medicine

## 2017-11-25 DIAGNOSIS — Z1231 Encounter for screening mammogram for malignant neoplasm of breast: Secondary | ICD-10-CM

## 2017-11-27 ENCOUNTER — Ambulatory Visit: Payer: BLUE CROSS/BLUE SHIELD | Admitting: Registered"

## 2017-11-27 ENCOUNTER — Other Ambulatory Visit: Payer: Self-pay | Admitting: Family Medicine

## 2017-11-27 DIAGNOSIS — R928 Other abnormal and inconclusive findings on diagnostic imaging of breast: Secondary | ICD-10-CM

## 2017-11-28 ENCOUNTER — Telehealth: Payer: Self-pay | Admitting: *Deleted

## 2017-11-28 NOTE — Telephone Encounter (Signed)
Received Physician Orders from [facility]; forwarded to provider/SLS 08/08

## 2017-11-29 ENCOUNTER — Other Ambulatory Visit: Payer: Self-pay | Admitting: Family Medicine

## 2017-11-29 ENCOUNTER — Ambulatory Visit
Admission: RE | Admit: 2017-11-29 | Discharge: 2017-11-29 | Disposition: A | Discharge status: Home / Self Care | Payer: BLUE CROSS/BLUE SHIELD | Source: Ambulatory Visit | Attending: Family Medicine | Admitting: Family Medicine

## 2017-11-29 DIAGNOSIS — R928 Other abnormal and inconclusive findings on diagnostic imaging of breast: Secondary | ICD-10-CM

## 2017-11-29 DIAGNOSIS — N6310 Unspecified lump in the right breast, unspecified quadrant: Secondary | ICD-10-CM | POA: Diagnosis not present

## 2017-11-29 DIAGNOSIS — R922 Inconclusive mammogram: Secondary | ICD-10-CM | POA: Diagnosis not present

## 2017-12-05 ENCOUNTER — Ambulatory Visit
Admission: RE | Admit: 2017-12-05 | Discharge: 2017-12-05 | Disposition: A | Discharge status: Home / Self Care | Payer: BLUE CROSS/BLUE SHIELD | Source: Ambulatory Visit | Attending: Family Medicine | Admitting: Family Medicine

## 2017-12-05 DIAGNOSIS — D241 Benign neoplasm of right breast: Secondary | ICD-10-CM | POA: Diagnosis not present

## 2017-12-05 DIAGNOSIS — R928 Other abnormal and inconclusive findings on diagnostic imaging of breast: Secondary | ICD-10-CM

## 2017-12-05 DIAGNOSIS — N6313 Unspecified lump in the right breast, lower outer quadrant: Secondary | ICD-10-CM | POA: Diagnosis not present

## 2017-12-05 DIAGNOSIS — N6314 Unspecified lump in the right breast, lower inner quadrant: Secondary | ICD-10-CM | POA: Diagnosis not present

## 2017-12-25 DIAGNOSIS — G4733 Obstructive sleep apnea (adult) (pediatric): Secondary | ICD-10-CM | POA: Diagnosis not present

## 2018-01-14 ENCOUNTER — Other Ambulatory Visit: Payer: Self-pay | Admitting: Family Medicine

## 2018-01-14 DIAGNOSIS — K219 Gastro-esophageal reflux disease without esophagitis: Secondary | ICD-10-CM

## 2018-01-22 ENCOUNTER — Encounter: Payer: Self-pay | Admitting: *Deleted

## 2018-02-10 ENCOUNTER — Encounter: Payer: Self-pay | Admitting: Family Medicine

## 2018-02-10 ENCOUNTER — Ambulatory Visit (INDEPENDENT_AMBULATORY_CARE_PROVIDER_SITE_OTHER): Payer: BLUE CROSS/BLUE SHIELD | Admitting: Family Medicine

## 2018-02-10 VITALS — BP 106/70 | HR 88 | Temp 99.0°F | Resp 16 | Ht 66.0 in | Wt 203.0 lb

## 2018-02-10 DIAGNOSIS — I10 Essential (primary) hypertension: Secondary | ICD-10-CM

## 2018-02-10 DIAGNOSIS — E785 Hyperlipidemia, unspecified: Secondary | ICD-10-CM

## 2018-02-10 DIAGNOSIS — Z0000 Encounter for general adult medical examination without abnormal findings: Secondary | ICD-10-CM

## 2018-02-10 LAB — COMPREHENSIVE METABOLIC PANEL
ALT: 17 U/L (ref 0–35)
AST: 22 U/L (ref 0–37)
Albumin: 4.1 g/dL (ref 3.5–5.2)
Alkaline Phosphatase: 54 U/L (ref 39–117)
BUN: 11 mg/dL (ref 6–23)
CO2: 28 mEq/L (ref 19–32)
Calcium: 9.7 mg/dL (ref 8.4–10.5)
Chloride: 103 mEq/L (ref 96–112)
Creatinine, Ser: 0.73 mg/dL (ref 0.40–1.20)
GFR: 109.78 mL/min (ref >60.00)
Glucose, Bld: 84 mg/dL (ref 70–99)
Potassium: 3.7 mEq/L (ref 3.5–5.1)
Sodium: 140 mEq/L (ref 135–145)
Total Bilirubin: 0.7 mg/dL (ref 0.2–1.2)
Total Protein: 6.8 g/dL (ref 6.0–8.3)

## 2018-02-10 LAB — CBC WITH DIFFERENTIAL/PLATELET
Basophils Absolute: 0.1 10*3/uL (ref 0.0–0.1)
Basophils Relative: 1 % (ref 0.0–3.0)
Eosinophils Absolute: 0.1 10*3/uL (ref 0.0–0.7)
Eosinophils Relative: 1.1 % (ref 0.0–5.0)
HCT: 35.6 % — ABNORMAL LOW (ref 36.0–46.0)
Hemoglobin: 12 g/dL (ref 12.0–15.0)
Lymphocytes Relative: 58.3 % — ABNORMAL HIGH (ref 12.0–46.0)
Lymphs Abs: 3.8 10*3/uL (ref 0.7–4.0)
MCHC: 33.8 g/dL (ref 30.0–36.0)
MCV: 95.1 fl (ref 78.0–100.0)
Monocytes Absolute: 0.4 10*3/uL (ref 0.1–1.0)
Monocytes Relative: 6.3 % (ref 3.0–12.0)
Neutro Abs: 2.2 10*3/uL (ref 1.4–7.7)
Neutrophils Relative %: 33.3 % — ABNORMAL LOW (ref 43.0–77.0)
Platelets: 230 10*3/uL (ref 150.0–400.0)
RBC: 3.74 Mil/uL — ABNORMAL LOW (ref 3.87–5.11)
RDW: 13.9 % (ref 11.5–15.5)
WBC: 6.5 10*3/uL (ref 4.0–10.5)

## 2018-02-10 LAB — LIPID PANEL
Cholesterol: 158 mg/dL (ref 0–200)
HDL: 41.9 mg/dL (ref >39.00)
LDL Cholesterol: 96 mg/dL (ref 0–99)
NonHDL: 116.11
Total CHOL/HDL Ratio: 4
Triglycerides: 100 mg/dL (ref 0.0–149.0)
VLDL: 20 mg/dL (ref 0.0–40.0)

## 2018-02-10 LAB — TSH: TSH: 1.6 u[IU]/mL (ref 0.35–4.50)

## 2018-02-10 LAB — VITAMIN B12: Vitamin B-12: >1525 pg/mL — ABNORMAL HIGH (ref 211–911)

## 2018-02-10 NOTE — Patient Instructions (Signed)
Preventive Care 40-64 Years, Female Preventive care refers to lifestyle choices and visits with your health care provider that can promote health and wellness. What does preventive care include?  A yearly physical exam. This is also called an annual well check.  Dental exams once or twice a year.  Routine eye exams. Ask your health care provider how often you should have your eyes checked.  Personal lifestyle choices, including: ? Daily care of your teeth and gums. ? Regular physical activity. ? Eating a healthy diet. ? Avoiding tobacco and drug use. ? Limiting alcohol use. ? Practicing safe sex. ? Taking low-dose aspirin daily starting at age 58. ? Taking vitamin and mineral supplements as recommended by your health care provider. What happens during an annual well check? The services and screenings done by your health care provider during your annual well check will depend on your age, overall health, lifestyle risk factors, and family history of disease. Counseling Your health care provider may ask you questions about your:  Alcohol use.  Tobacco use.  Drug use.  Emotional well-being.  Home and relationship well-being.  Sexual activity.  Eating habits.  Work and work Statistician.  Method of birth control.  Menstrual cycle.  Pregnancy history.  Screening You may have the following tests or measurements:  Height, weight, and BMI.  Blood pressure.  Lipid and cholesterol levels. These may be checked every 5 years, or more frequently if you are over 81 years old.  Skin check.  Lung cancer screening. You may have this screening every year starting at age 78 if you have a 30-pack-year history of smoking and currently smoke or have quit within the past 15 years.  Fecal occult blood test (FOBT) of the stool. You may have this test every year starting at age 65.  Flexible sigmoidoscopy or colonoscopy. You may have a sigmoidoscopy every 5 years or a colonoscopy  every 10 years starting at age 30.  Hepatitis C blood test.  Hepatitis B blood test.  Sexually transmitted disease (STD) testing.  Diabetes screening. This is done by checking your blood sugar (glucose) after you have not eaten for a while (fasting). You may have this done every 1-3 years.  Mammogram. This may be done every 1-2 years. Talk to your health care provider about when you should start having regular mammograms. This may depend on whether you have a family history of breast cancer.  BRCA-related cancer screening. This may be done if you have a family history of breast, ovarian, tubal, or peritoneal cancers.  Pelvic exam and Pap test. This may be done every 3 years starting at age 80. Starting at age 36, this may be done every 5 years if you have a Pap test in combination with an HPV test.  Bone density scan. This is done to screen for osteoporosis. You may have this scan if you are at high risk for osteoporosis.  Discuss your test results, treatment options, and if necessary, the need for more tests with your health care provider. Vaccines Your health care provider may recommend certain vaccines, such as:  Influenza vaccine. This is recommended every year.  Tetanus, diphtheria, and acellular pertussis (Tdap, Td) vaccine. You may need a Td booster every 10 years.  Varicella vaccine. You may need this if you have not been vaccinated.  Zoster vaccine. You may need this after age 5.  Measles, mumps, and rubella (MMR) vaccine. You may need at least one dose of MMR if you were born in  1957 or later. You may also need a second dose.  Pneumococcal 13-valent conjugate (PCV13) vaccine. You may need this if you have certain conditions and were not previously vaccinated.  Pneumococcal polysaccharide (PPSV23) vaccine. You may need one or two doses if you smoke cigarettes or if you have certain conditions.  Meningococcal vaccine. You may need this if you have certain  conditions.  Hepatitis A vaccine. You may need this if you have certain conditions or if you travel or work in places where you may be exposed to hepatitis A.  Hepatitis B vaccine. You may need this if you have certain conditions or if you travel or work in places where you may be exposed to hepatitis B.  Haemophilus influenzae type b (Hib) vaccine. You may need this if you have certain conditions.  Talk to your health care provider about which screenings and vaccines you need and how often you need them. This information is not intended to replace advice given to you by your health care provider. Make sure you discuss any questions you have with your health care provider. Document Released: 05/06/2015 Document Revised: 12/28/2015 Document Reviewed: 02/08/2015 Elsevier Interactive Patient Education  2018 [employer] Inc.  

## 2018-02-10 NOTE — Assessment & Plan Note (Signed)
Encouraged heart healthy diet, increase exercise, avoid trans fats, consider a krill oil cap daily 

## 2018-02-10 NOTE — Progress Notes (Signed)
Patient ID: Makayla Good, female    DOB: 15-Nov-1970  Age: 47 y.o. MRN: [id number]        Subjective:     Makayla Good is a 47 y.o. female and is here for a comprehensive physical exam. The patient reports no problems.  Social History   Socioeconomic History  . Marital status: Married    Spouse name: Not on file  . Number of children: Not on file  . Years of education: 54  . Highest education level: Not on file  Occupational History  . Occupation: release Horticulturist, commercial: [employer]  Social Needs  . Financial resource strain: Not on file  . Food insecurity:    Worry: Sometimes true    Inability: Never true  . Transportation needs:    Medical: Not on file    Non-medical: Not on file  Tobacco Use  . Smoking status: Current Every Day Smoker    Packs/day: 0.50    Years: 16.00    Pack years: 8.00    Types: Cigarettes    Start date: 03/23/1993  . Smokeless tobacco: Never Used  Substance and Sexual Activity  . Alcohol use: Yes    Comment: occasional  . Drug use: No  . Sexual activity: Yes    Partners: Male    Comment: been with same person over 20 years  Lifestyle  . Physical activity:    Days per week: Not on file    Minutes per session: Not on file  . Stress: Not on file  Relationships  . Social connections:    Talks on phone: Not on file    Gets together: Not on file    Attends religious service: Not on file    Active member of club or organization: Not on file    Attends meetings of clubs or organizations: Not on file    Relationship status: Not on file  . Intimate partner violence:    Fear of current or ex partner: Not on file    Emotionally abused: Not on file    Physically abused: Not on file    Forced sexual activity: Not on file  Other Topics Concern  . Not on file  Social History Narrative   Exercise--gym, every other day--- at least 3 days a week   Health Maintenance  Topic Date Due  . MAMMOGRAM  11/26/2018  . PAP SMEAR   01/23/2019  . TETANUS/TDAP  09/03/2021  . INFLUENZA VACCINE  Completed  . HIV Screening  Completed    The following portions of the patient's history were reviewed and updated as appropriate:  She  has a past medical history of Asthma, Eczema, H/O oophorectomy, Hyperlipidemia, Hypertension, Obesity, Polycystic ovary, and Sleep apnea. She does not have any pertinent problems on file. She  has a past surgical history that includes Cesarean section (02/01/2005); Tubal ligation (02/01/2005); Cesarean section (2001); and Laparoscopic gastric sleeve resection (N/A, 07/29/2017). Her family history includes Arthritis in her sister; Coronary artery disease in her father; Dementia in her maternal aunt and mother; Hypertension in her father, mother, sister, and sister; Kidney disease in her unknown relative; Mental illness in her mother; Schizophrenia in her mother. She  reports that she has been smoking cigarettes. She started smoking about 24 years ago. She has a 8.00 pack-year smoking history. She has never used smokeless tobacco. She reports that she drinks alcohol. She reports that she does not use drugs. She has a current medication list which  includes the following prescription(s): albuterol, calcium citrate-vitamin d, hydrochlorothiazide, multiple vitamins-minerals, pantoprazole, and vitamin b-12. Current Outpatient Medications on File Prior to Visit  Medication Sig Dispense Refill  . albuterol (PROVENTIL HFA;VENTOLIN HFA) 108 (90 Base) MCG/ACT inhaler Inhale 2 puffs into the lungs every 6 (six) hours as needed for wheezing or shortness of breath. 1 Inhaler 2  . Calcium Citrate-Vitamin D (CALCIUM CITRATE + D PO) Take 1 each by mouth daily.    . hydrochlorothiazide (HYDRODIURIL) 25 MG tablet Take 1 tablet (25 mg total) by mouth daily. 90 tablet 3  . Multiple Vitamins-Minerals (CELEBRATE MULTI-COMPLETE 36 PO) Take 1 each by mouth daily.    . pantoprazole (PROTONIX) 40 MG tablet Take 1 tablet (40 mg  total) by mouth daily. 90 tablet 3  . vitamin B-12 (CYANOCOBALAMIN) 100 MCG tablet Take 100 mcg by mouth daily.     No current facility-administered medications on file prior to visit.    She has No Known Allergies..  Review of Systems Review of Systems  Constitutional: Negative for activity change, appetite change and fatigue.  HENT: Negative for hearing loss, congestion, tinnitus and ear discharge.  dentist q83m Eyes: Negative for visual disturbance (see optho q1y -- vision corrected to 20/20 with glasses).  Respiratory: Negative for cough, chest tightness and shortness of breath.   Cardiovascular: Negative for chest pain, palpitations and leg swelling.  Gastrointestinal: Negative for abdominal pain, diarrhea, constipation and abdominal distention.  Genitourinary: Negative for urgency, frequency, decreased urine volume and difficulty urinating.  Musculoskeletal: Negative for back pain, arthralgias and gait problem.  Skin: Negative for color change, pallor and rash.  Neurological: Negative for dizziness, light-headedness, numbness and headaches.  Hematological: Negative for adenopathy. Does not bruise/bleed easily.  Psychiatric/Behavioral: Negative for suicidal ideas, confusion, sleep disturbance, self-injury, dysphoric mood, decreased concentration and agitation.       Objective:    BP 106/70 (BP Location: Left Arm, Cuff Size: Large)   Pulse 88   Temp 99 F (37.2 C) (Oral)   Resp 16   Ht 5\' 6"  (1.676 m)   Wt 203 lb (92.1 kg)   LMP 02/08/2018   SpO2 98%   BMI 32.77 kg/m  General appearance: alert, cooperative, appears stated age and no distress Head: Normocephalic, without obvious abnormality, atraumatic Eyes: conjunctivae/corneas clear. PERRL, EOM's intact. Fundi benign. Ears: normal TM's and external ear canals both ears Nose: Nares normal. Septum midline. Mucosa normal. No drainage or sinus tenderness. Throat: lips, mucosa, and tongue normal; teeth and gums  normal Neck: no adenopathy, no carotid bruit, no JVD, supple, symmetrical, trachea midline and thyroid not enlarged, symmetric, no tenderness/mass/nodules Back: symmetric, no curvature. ROM normal. No CVA tenderness. Lungs: clear to auscultation bilaterally Breasts: normal appearance, no masses or tenderness Heart: regular rate and rhythm, S1, S2 normal, no murmur, click, rub or gallop Abdomen: soft, non-tender; bowel sounds normal; no masses,  no organomegaly Pelvic: deferred Extremities: extremities normal, atraumatic, no cyanosis or edema Pulses: 2+ and symmetric Skin: Skin color, texture, turgor normal. No rashes or lesions Lymph nodes: Cervical, supraclavicular, and axillary nodes normal. Neurologic: Alert and oriented X 3, normal strength and tone. Normal symmetric reflexes. Normal coordination and gait    Assessment:    Healthy female exam.      Plan:    ghm utd Check labs  See After Visit Summary for Counseling Recommendations    1. Preventative health care See above - CBC with Differential/Platelet - Lipid panel - Comprehensive metabolic panel - TSH - Vitamin  B12 - Vitamin D 1,25 dihydroxy  2. Essential hypertension Well controlled, no changes to meds. Encouraged heart healthy diet such as the DASH diet and exercise as tolerated.   3. Hyperlipidemia LDL goal <100 Encouraged heart healthy diet, increase exercise, avoid trans fats, consider a krill oil cap daily

## 2018-02-13 LAB — VITAMIN D 1,25 DIHYDROXY
Vitamin D 1, 25 (OH)2 Total: 47 pg/mL (ref 18–72)
Vitamin D2 1, 25 (OH)2: 9 pg/mL
Vitamin D3 1, 25 (OH)2: 38 pg/mL

## 2018-04-10 DIAGNOSIS — I10 Essential (primary) hypertension: Secondary | ICD-10-CM | POA: Diagnosis not present

## 2018-04-10 DIAGNOSIS — M1990 Unspecified osteoarthritis, unspecified site: Secondary | ICD-10-CM | POA: Diagnosis not present

## 2018-04-10 DIAGNOSIS — Z9884 Bariatric surgery status: Secondary | ICD-10-CM | POA: Diagnosis not present

## 2018-04-10 DIAGNOSIS — E559 Vitamin D deficiency, unspecified: Secondary | ICD-10-CM | POA: Diagnosis not present

## 2018-04-21 ENCOUNTER — Encounter: Payer: Self-pay | Admitting: Obstetrics and Gynecology

## 2018-05-05 ENCOUNTER — Encounter: Payer: BLUE CROSS/BLUE SHIELD | Admitting: Obstetrics and Gynecology

## 2018-05-22 ENCOUNTER — Encounter: Payer: Self-pay | Admitting: Obstetrics and Gynecology

## 2018-05-22 ENCOUNTER — Other Ambulatory Visit (HOSPITAL_COMMUNITY)
Admission: RE | Admit: 2018-05-22 | Discharge: 2018-05-22 | Disposition: A | Discharge status: Home / Self Care | Payer: BLUE CROSS/BLUE SHIELD | Source: Ambulatory Visit | Attending: Obstetrics and Gynecology | Admitting: Obstetrics and Gynecology

## 2018-05-22 ENCOUNTER — Other Ambulatory Visit: Payer: Self-pay

## 2018-05-22 ENCOUNTER — Ambulatory Visit: Payer: BLUE CROSS/BLUE SHIELD | Admitting: Obstetrics and Gynecology

## 2018-05-22 VITALS — BP 124/84 | HR 80 | Resp 18 | Ht 65.0 in | Wt 200.0 lb

## 2018-05-22 DIAGNOSIS — Z01419 Encounter for gynecological examination (general) (routine) without abnormal findings: Secondary | ICD-10-CM | POA: Insufficient documentation

## 2018-05-22 DIAGNOSIS — N926 Irregular menstruation, unspecified: Secondary | ICD-10-CM

## 2018-05-22 DIAGNOSIS — D219 Benign neoplasm of connective and other soft tissue, unspecified: Secondary | ICD-10-CM | POA: Diagnosis not present

## 2018-05-22 DIAGNOSIS — N83202 Unspecified ovarian cyst, left side: Secondary | ICD-10-CM

## 2018-05-22 DIAGNOSIS — Z1211 Encounter for screening for malignant neoplasm of colon: Secondary | ICD-10-CM | POA: Diagnosis not present

## 2018-05-22 NOTE — Patient Instructions (Signed)

## 2018-05-22 NOTE — Progress Notes (Signed)
48 y.o. [id number] Married [place] American female here for annual exam.    No menses since October 2019.  Has vaginal discharge and no odor and no itching.  Had hot flashes for 2 months, but it resolved.   Had weight loss surgery in April. Now difficulty loosing weight.   Had pelvic US last year in Feb. 2019 due to irregular menses.  She had a small left ovarian cyst with septation and 2 fibroids.   Works for [employer] [place] for 21 years.  From [place].  Children 13 and 18.   PCP:  [name], DO   Patient's last menstrual period was 02/09/2018 (exact date).           Sexually active: Yes.   female The current method of family planning is tubal ligation.    Exercising: Yes.    treadmill and weights Smoker:  Yes, smokes 1/2 ppd  Health Maintenance: Pap: 01-23-16 Neg:Neg HR HPV History of abnormal Pap:  no MMG:  11-25-17 Rt.Br.poss.mass,Lt.Br.neg/c--Diag.Rt.& [place] --indeterminate mass Rt.Br.6 o'clock, rec.bx./BiRads4--12-05-17 Rt.Br.Bx pathology revealed FIBROADENOMA Colonoscopy:  N/a.    BMD:   n/a  Result  n/a TDaP:  PCP Gardasil:   no HIV: 06-13-17 NR Hep C: Unsure Screening Labs:  Hb today: PCP Flu vaccine:  Done.    reports that she has been smoking cigarettes. She started smoking about 25 years ago. She has a 8.00 pack-year smoking history. She has never used smokeless tobacco. She reports current alcohol use of about 1.0 standard drinks of alcohol per week. She reports that she does not use drugs.  Past Medical History:  Diagnosis Date  . Asthma   . Eczema   . Fibroid   . H/O oophorectomy   . Hyperlipidemia   . Hypertension   . Obesity   . Polycystic ovary    Left  . Sleep apnea     Past Surgical History:  Procedure Laterality Date  . CESAREAN SECTION  02/01/2005  . CESAREAN SECTION  2001  . LAPAROSCOPIC GASTRIC SLEEVE RESECTION N/A 07/29/2017   Procedure: LAPAROSCOPIC GASTRIC SLEEVE RESECTION WITH UPPER ENDO ;  Surgeon: [name], MD;  Location: [facility];   Service: General;  Laterality: N/A;  . OOPHORECTOMY     Rt.removed  . TUBAL LIGATION  02/01/2005    Current Outpatient Medications  Medication Sig Dispense Refill  . Calcium Citrate-Vitamin D (CALCIUM CITRATE + D PO) Take 1 each by mouth daily.    . hydrochlorothiazide (HYDRODIURIL) 25 MG tablet Take 1 tablet (25 mg total) by mouth daily. (Patient taking differently: Take 25 mg by mouth as needed. ) 90 tablet 3  . Multiple Vitamins-Minerals (CELEBRATE MULTI-COMPLETE 36 PO) Take 1 each by mouth daily.    . pantoprazole (PROTONIX) 40 MG tablet Take 1 tablet (40 mg total) by mouth daily. 90 tablet 3  . vitamin B-12 (CYANOCOBALAMIN) 100 MCG tablet Take 100 mcg by mouth daily.     No current facility-administered medications for this visit.     Family History  Problem Relation Age of Onset  . Hypertension Mother   . Schizophrenia Mother   . Dementia Mother   . Mental illness Mother        schizophrenia, dementia  . Hypertension Father   . Coronary artery disease Father        Stent  . Hypertension Sister   . Hypertension Sister   . Arthritis Sister        rheumatoid  . Dementia Maternal Aunt   .  Kidney disease Other     Review of Systems  All other systems reviewed and are negative.   Exam:   BP 124/84 (BP Location: Right Arm, Patient Position: Sitting, Cuff Size: Large)   Pulse 80   Resp 18   Ht 5\' 5"  (1.651 m)   Wt 200 lb (90.7 kg)   LMP 02/09/2018 (Exact Date)   BMI 33.28 kg/m     General appearance: alert, cooperative and appears stated age Head: Normocephalic, without obvious abnormality, atraumatic Neck: no adenopathy, supple, symmetrical, trachea midline and thyroid normal to inspection and palpation Lungs: clear to auscultation bilaterally Breasts: normal appearance, no masses or tenderness, No nipple retraction or dimpling, No nipple discharge or bleeding, No axillary or supraclavicular adenopathy Heart: regular rate and rhythm Abdomen: soft, non-tender; no  masses, no organomegaly Extremities: extremities normal, atraumatic, no cyanosis or edema Skin: Skin color, texture, turgor normal. No rashes or lesions Lymph nodes: Cervical, supraclavicular, and axillary nodes normal. No abnormal inguinal nodes palpated Neurologic: Grossly normal  Pelvic: External genitalia:  no lesions              Urethra:  normal appearing urethra with no masses, tenderness or lesions              Bartholins and Skenes: normal                 Vagina: normal appearing vagina with normal color and discharge, no lesions              Cervix: no lesions              Pap taken: Yes.   Bimanual Exam:  Uterus:  normal size, contour, position, consistency, mobility, non-tender              Adnexa: no mass, fullness, tenderness              Rectal exam: Yes.  .  Confirms.              Anus:  normal sphincter tone, no lesions  Chaperone was present for exam.  Assessment:   Well woman visit with normal exam. Fibroids. Left ovarian cyst with septation.  Status post right oophorectomy.  Status post gastric sleeve surgery.  Status post BTL.  Smoker.   Plan: Mammogram screening. Recommended self breast awareness. Pap and HR HPV as above. Guidelines for Calcium, Vitamin D, regular exercise program including cardiovascular and weight bearing exercise. Discussed smoking cessation.  [place] and E2.  Will do course of Provera if not menopausal. Return for pelvic US.  IFOB.  Follow up annually and prn.   After visit summary provided.

## 2018-05-23 LAB — FOLLICLE STIMULATING HORMONE: FSH: 15.3 m[IU]/mL

## 2018-05-23 LAB — ESTRADIOL: Estradiol: 73.4 pg/mL

## 2018-05-26 ENCOUNTER — Other Ambulatory Visit: Payer: Self-pay | Admitting: *Deleted

## 2018-05-26 LAB — CYTOLOGY - PAP
Diagnosis: NEGATIVE
HPV: NOT DETECTED

## 2018-05-26 MED ORDER — MEDROXYPROGESTERONE ACETATE 10 MG PO TABS: 10.0000 mg | ORAL_TABLET | Freq: Every day | ORAL | 0 refills | Status: DC

## 2018-05-29 ENCOUNTER — Encounter: Payer: Self-pay | Admitting: Obstetrics and Gynecology

## 2018-05-29 ENCOUNTER — Ambulatory Visit: Payer: BLUE CROSS/BLUE SHIELD | Admitting: Obstetrics and Gynecology

## 2018-05-29 ENCOUNTER — Ambulatory Visit (INDEPENDENT_AMBULATORY_CARE_PROVIDER_SITE_OTHER): Payer: BLUE CROSS/BLUE SHIELD

## 2018-05-29 ENCOUNTER — Other Ambulatory Visit: Payer: Self-pay

## 2018-05-29 VITALS — BP 126/84 | HR 70 | Ht 65.0 in | Wt 200.0 lb

## 2018-05-29 DIAGNOSIS — D219 Benign neoplasm of connective and other soft tissue, unspecified: Secondary | ICD-10-CM

## 2018-05-29 DIAGNOSIS — Z86018 Personal history of other benign neoplasm: Secondary | ICD-10-CM

## 2018-05-29 DIAGNOSIS — N83202 Unspecified ovarian cyst, left side: Secondary | ICD-10-CM

## 2018-05-29 DIAGNOSIS — Z8742 Personal history of other diseases of the female genital tract: Secondary | ICD-10-CM | POA: Diagnosis not present

## 2018-05-29 DIAGNOSIS — N951 Menopausal and female climacteric states: Secondary | ICD-10-CM | POA: Diagnosis not present

## 2018-05-29 NOTE — Progress Notes (Signed)
GYNECOLOGY  VISIT   HPI: 48 y.o.   Married  [place] American  female   [phone] with Patient's last menstrual period was 05/27/2018 (exact date).   here for pelvic ultrasound.  Patient has a small left ovarian cyst with septation and 2 fibroids by [place] in Feb. 2019.   She did have her menses start spontaneously, so she did not take the Provera.  Had not had a period since Oct. 2019.    FSH 15.3 and estradiol 73.4 on 05/22/18.  GYNECOLOGIC HISTORY: Patient's last menstrual period was 05/27/2018 (exact date). Contraception:  Tubal Menopausal hormone therapy: none Last mammogram:   11-25-17 Rt.Br.poss.mass,Lt.Br.neg/c--Diag.Rt.& [place] --indeterminate mass Rt.Br.6 o'clock, rec.bx./BiRads4--12-05-17 Rt.Br.Bx pathology revealed FIBROADENOMA Last pap smear: 05-22-18 Neg:Neg HR HPV                              01-23-16 Neg:Neg HR HPV        OB History    Gravida  5   Para  2   Term      Preterm      AB  3   Living  2     SAB  2   TAB  1   Ectopic      Multiple      Live Births                 Patient Active Problem List   Diagnosis Date Noted  . Essential hypertension 08/05/2017  . Hypokalemia 08/05/2017  . OSA (obstructive sleep apnea) 03/05/2017  . Asthma 03/05/2017  . Dizziness 02/08/2017  . Preventative health care 02/08/2017  . Abdominal pain 07/05/2016  . Right upper quadrant abdominal pain 07/05/2016  . Irregular periods 07/26/2014  . Dysuria 02/04/2014  . Severe obesity (BMI >= 40) ([place]) 10/21/2012  . OA (osteoarthritis) of knee 06/04/2012  . Cough 03/05/2012  . Plantar fasciitis 09/04/2010  . HYPOKALEMIA 07/01/2009  . CHEST PAIN UNSPECIFIED 05/18/2009  . DEPRESSION 03/07/2009  . FATIGUE 03/07/2009  . TOBACCO USER 12/03/2008  . Morbid obesity ([place]) 05/21/2008  . Hyperlipidemia LDL goal <100 05/22/2007  . DYSMETABOLIC SYNDROME X 74/25/9563  . CONJUNCTIVITIS NOS 02/04/2007  . URI 02/04/2007  . VAGINITIS NOS 11/07/2006  . FUNGAL DERMATITIS 10/09/2006   . Benign essential HTN 10/09/2006    Past Medical History:  Diagnosis Date  . Asthma   . Eczema   . Fibroid   . H/O oophorectomy   . Hyperlipidemia   . Hypertension   . Obesity   . Polycystic ovary    Left  . Sleep apnea     Past Surgical History:  Procedure Laterality Date  . CESAREAN SECTION  02/01/2005  . CESAREAN SECTION  2001  . LAPAROSCOPIC GASTRIC SLEEVE RESECTION N/A 07/29/2017   Procedure: LAPAROSCOPIC GASTRIC SLEEVE RESECTION WITH UPPER ENDO ;  Surgeon: [name], MD;  Location: [facility];  Service: General;  Laterality: N/A;  . OOPHORECTOMY     Rt.removed  . TUBAL LIGATION  02/01/2005    Current Outpatient Medications  Medication Sig Dispense Refill  . Calcium Citrate-Vitamin D (CALCIUM CITRATE + D PO) Take 1 each by mouth daily.    . hydrochlorothiazide (HYDRODIURIL) 25 MG tablet Take 1 tablet (25 mg total) by mouth daily. (Patient taking differently: Take 25 mg by mouth as needed. ) 90 tablet 3  . Multiple Vitamins-Minerals (CELEBRATE MULTI-COMPLETE 36 PO) Take 1 each by mouth daily.    Marland Kitchen  pantoprazole (PROTONIX) 40 MG tablet Take 1 tablet (40 mg total) by mouth daily. 90 tablet 3  . vitamin B-12 (CYANOCOBALAMIN) 100 MCG tablet Take 100 mcg by mouth daily.    . medroxyPROGESTERone (PROVERA) 10 MG tablet Take 1 tablet (10 mg total) by mouth daily for 10 days. (Patient not taking: Reported on 05/29/2018) 10 tablet 0   No current facility-administered medications for this visit.      ALLERGIES: Patient has no known allergies.  Family History  Problem Relation Age of Onset  . Hypertension Mother   . Schizophrenia Mother   . Dementia Mother   . Mental illness Mother        schizophrenia, dementia  . Hypertension Father   . Coronary artery disease Father        Stent  . Hypertension Sister   . Hypertension Sister   . Arthritis Sister        rheumatoid  . Dementia Maternal Aunt   . Kidney disease Other     Social History   Socioeconomic History    . Marital status: Married    Spouse name: Not on file  . Number of children: Not on file  . Years of education: 2  . Highest education level: Not on file  Occupational History  . Occupation: release Horticulturist, commercial: [employer]  Social Needs  . Financial resource strain: Not on file  . Food insecurity:    Worry: Sometimes true    Inability: Never true  . Transportation needs:    Medical: Not on file    Non-medical: Not on file  Tobacco Use  . Smoking status: Current Every Day Smoker    Packs/day: 0.50    Years: 16.00    Pack years: 8.00    Types: Cigarettes    Start date: 03/23/1993  . Smokeless tobacco: Never Used  Substance and Sexual Activity  . Alcohol use: Yes    Alcohol/week: 1.0 standard drinks    Types: 1 Glasses of wine per week    Comment: occasional  . Drug use: No  . Sexual activity: Yes    Partners: Male    Birth control/protection: Surgical    Comment: Tubal/been with same person over 20 years  Lifestyle  . Physical activity:    Days per week: Not on file    Minutes per session: Not on file  . Stress: Not on file  Relationships  . Social connections:    Talks on phone: Not on file    Gets together: Not on file    Attends religious service: Not on file    Active member of club or organization: Not on file    Attends meetings of clubs or organizations: Not on file    Relationship status: Not on file  . Intimate partner violence:    Fear of current or ex partner: Not on file    Emotionally abused: Not on file    Physically abused: Not on file    Forced sexual activity: Not on file  Other Topics Concern  . Not on file  Social History Narrative   Exercise--gym, every other day--- at least 3 days a week    Review of Systems  All other systems reviewed and are negative.   PHYSICAL EXAMINATION:    BP 126/84 (BP Location: Right Arm, Patient Position: Sitting, Cuff Size: Large)   Pulse 70   Ht 5\' 5"  (1.651 m)   Wt 200 lb (90.7 kg)  LMP  05/27/2018 (Exact Date)   BMI 33.28 kg/m     General appearance: alert, cooperative and appears stated age   Pelvic US Normal uterus with no fibroids visualized.  EMS 3.74 mm. Normal left ovary. Right ovary absent.   No free fluid.  Chaperone was present for exam.  ASSESSMENT  Resolution of ovarian cyst.  Hx prior fibroid? Perimenopausal female.   PLAN  Reassurance regarding resolution of ovarian cyst.  We discussed perimenopause.  She will call if no menses for 3 months or if she develops cycles that are also close together.  Fu for annual exam and prn.    An After Visit Summary was printed and given to the patient.  ___15___ minutes face to face time of which over 50% was spent in counseling.

## 2018-07-01 ENCOUNTER — Encounter: Payer: Self-pay | Admitting: Pulmonary Disease

## 2018-07-01 ENCOUNTER — Ambulatory Visit: Payer: BLUE CROSS/BLUE SHIELD | Admitting: Pulmonary Disease

## 2018-07-01 ENCOUNTER — Other Ambulatory Visit: Payer: Self-pay | Admitting: Pulmonary Disease

## 2018-07-01 DIAGNOSIS — G4733 Obstructive sleep apnea (adult) (pediatric): Secondary | ICD-10-CM | POA: Diagnosis not present

## 2018-07-01 DIAGNOSIS — J4520 Mild intermittent asthma, uncomplicated: Secondary | ICD-10-CM | POA: Diagnosis not present

## 2018-07-01 NOTE — Assessment & Plan Note (Signed)
Not convinced this is true asthma. Okay to stay off albuterol. She call call us if worse

## 2018-07-01 NOTE — Assessment & Plan Note (Signed)
She has lost significant weight and is now having headaches and 9 cm Decrease CPAP pressure to 7 cm Plan for home sleep test to see if OSA is resolved, may not need CPAP at all  .

## 2018-07-01 NOTE — Progress Notes (Signed)
   Subjective:    Patient ID: Makayla Good, female    DOB: 03-21-1971, 48 y.o.   MRN: [id number]  HPI  48 yo woman for FU of moderate obstructive sleep apnea & asthma -started on CPAP November 2018 She underwent gastric sleeve 07/2017   Chief Complaint  Patient presents with  . Follow-up    6 month follow up for OSA. Uses [employer] as her DME.    She continues to lose weight and has dropped to 200 pounds.  She had stopped using her CPAP machine for a while and is now back on it with nasal pillows.  On 9 cm she developed headaches mostly frontal and feels like this is coming from a CPAP machine and wonders if pressure needs to be decreased.  Machine did help and "opened her up" and she felt like she had slightly increased energy. She admits to less quantity of sleep. CPAP download was reviewed which shows good control of events of 9 cm with minimal leak  Significant tests/ events reviewed  NP SG 02/2016  AHI of 15/hour with a REM related AHI of 68/hour -267 lbs  Review of Systems neg for any significant sore throat, dysphagia, itching, sneezing, nasal congestion or excess/ purulent secretions, fever, chills, sweats, unintended wt loss, pleuritic or exertional cp, hempoptysis, orthopnea pnd or change in chronic leg swelling. Also denies presyncope, palpitations, heartburn, abdominal pain, nausea, vomiting, diarrhea or change in bowel or urinary habits, dysuria,hematuria, rash, arthralgias, visual complaints, headache, numbness weakness or ataxia.     Objective:   Physical Exam  Gen. Pleasant, obese, in no distress ENT - no lesions, no post nasal drip, class 2 airway Neck: No JVD, no thyromegaly, no carotid bruits Lungs: no use of accessory muscles, no dullness to percussion, decreased without rales or rhonchi  Cardiovascular: Rhythm regular, heart sounds  normal, no murmurs or gallops, no peripheral edema Musculoskeletal: No deformities, no cyanosis or clubbing , no  tremors        Assessment & Plan:

## 2018-07-01 NOTE — Patient Instructions (Signed)
Decrease CPAP pressure to 7 cm Plan for home sleep test and we will call you with results

## 2018-07-09 ENCOUNTER — Encounter: Payer: BLUE CROSS/BLUE SHIELD | Attending: Surgery | Admitting: Skilled Nursing Facility1

## 2018-07-09 ENCOUNTER — Other Ambulatory Visit: Payer: Self-pay

## 2018-07-09 DIAGNOSIS — E669 Obesity, unspecified: Secondary | ICD-10-CM | POA: Diagnosis not present

## 2018-07-09 NOTE — Progress Notes (Signed)
Follow-up visit: Post-Operative Sleeve Gastrectomy Surgery  Medical Nutrition Therapy:  Appt start time: 3:40 end time:  4:30.  Primary concerns today: Post-operative Bariatric Surgery Nutrition Management.  Non scale victories: walking faster on treadmill, improve knee pain, able to move better, wearing smaller sizes, decreased pants by 3 sizes, improved feet issues   Pt states she has had surgery since before surgery and it has not stopped since having her surgery so taking Protonix every day. Pt states her weight has slowed down which is concerning for her and thinks she will be about 180 pounds and is working on accepting it. Pt states she no longer struggles with meats. Pt states her son makes her schedule chaotic. Pt states she thinks she is salt sensitive taking a fluid pill ever other day.   Surgery date: 07/29/2017 Surgery type: Sleeve  Start weight at [facility]: 273.4 Weight today: 197.6 Weight loss goal: "get healthy, help with arthritis in knees, able to walk without pain, improve feet issues"   Body Composition Scale  Total Body Fat: 38.3 %  Visceral Fat: 11  Fat-Free Mass: 61.6  %   Total Body Water: 45.3  %  Muscle-Mass: 31.9  lbs  Body Fat Displacement:  Torso:  46.9 lbs Left Leg: 9.3 lbs Right Leg: 9.3 lbs Left Arm: 4.6 lbs Right Arm:4.6  lbs   24-hr recall: 3 meals and 2-3 snacks 7 days a week B (AM): yogurt or boiled egg Snk (AM):almonds  L (PM): tuna (15g) and crackers  Snk (PM): chips or cheese and crackers or peanut butter crackers  D (PM): [place] burger or hamburger  Snk (PM): cheese  Fluid intake: water, coffee 2-3 cups;  Estimated total protein intake: 60+ grams  Medications: See list Supplementation: woman one a day + 6 calcium supplements + vitamin b12 + vitamin D  Using straws: no Drinking while eating: no Having you been chewing well: yes Chewing/swallowing difficulties: no Changes in vision: no Changes to mood/headaches: sometimes Hair  loss/Changes to skin/Changes to nails: no, no, no Any difficulty focusing or concentrating: no Sweating: no Dizziness/Lightheaded: no Palpitations: no  Carbonated beverages: no N/V/D/C/GAS: no Abdominal Pain: no Dumping syndrome: no  Recent physical activity:  ADL's  Progress Towards Goal(s):  In progress.   Nutritional Diagnosis:  [place]-3.3 Overweight/obesity related to past poor dietary habits and physical inactivity as evidenced by patient w/ recent sleeve gastrectomy surgery following dietary guidelines for continued weight loss.    Intervention:  Nutrition education and counseling. Pt was educated and counseled on how to track protein intake, trying new food items on the weekends, and embracing her journey. Pt was encouraged to keep up the great work especially with meeting fluid goals and having physical activity most days of the week.  Goals: -Try raw vegetables aging: chew until applesauce consistency  -Eat what you can in your lunch time at work and then finish the rest when you get time to prevent eating too quickly  -Focus your choices on whole nutrient dense foods  -Eat a minimum of 50 grams of carbohydrate a day: choose fruit over crackers  -Limit crackers to once a day -Starchy vegetable once a day -Fruit once a day  -Be physically active for at least 30 minutes at least 5 days a week: maybe a youtube video  -Take away 1 cup of coffee; aim for 70-80 ounces of fluid a day  -Take one of the appropriate multivitamins in capsule with food on your stomach   -stop taking the  b12  Teaching Method Utilized:  Visual Auditory Hands on  Barriers to learning/adherence to lifestyle change: none identified  Demonstrated degree of understanding via:  Teach Back   Monitoring/Evaluation:  Dietary intake, exercise, lap band fills, and body weight. Follow up in 2 months for 4 month post-op visit.

## 2018-07-09 NOTE — Patient Instructions (Addendum)
-  Try raw vegetables aging: chew until applesauce consistency   -Eat what you can in your lunch time at work and then finish the rest when you get time to prevent eating too quickly   -Focus your choices on whole nutrient dense foods   -Eat a minimum of 50 grams of carbohydrate a day: choose fruit over crackers   -Limit crackers to once a day  -Starchy vegetable once a day  -Fruit once a day   -Be physically active for at least 30 minutes at least 5 days a week: maybe a youtube video   -Take away 1 cup of coffee; aim for 70-80 ounces of fluid a day   -Take one of the appropriate multivitamins in capsule with food on your stomach   -stop taking the b12

## 2018-08-14 ENCOUNTER — Ambulatory Visit: Payer: BLUE CROSS/BLUE SHIELD

## 2018-08-14 ENCOUNTER — Other Ambulatory Visit: Payer: Self-pay

## 2018-08-14 DIAGNOSIS — G4733 Obstructive sleep apnea (adult) (pediatric): Secondary | ICD-10-CM

## 2018-08-19 ENCOUNTER — Telehealth: Payer: Self-pay | Admitting: Pulmonary Disease

## 2018-08-19 DIAGNOSIS — G4733 Obstructive sleep apnea (adult) (pediatric): Secondary | ICD-10-CM | POA: Diagnosis not present

## 2018-08-19 NOTE — Telephone Encounter (Signed)
Called and spoke with pt letting her know the results from the most recent HST she had performed and stated to her it showed mild OSA that RA said if she wanted to dc cpap we could do that. Pt expressed understanding and stated that she would like cpap to be dc'd. Order has been placed to [facility] to have cpap dc'd. Nothing further needed.

## 2018-08-19 NOTE — Telephone Encounter (Signed)
Home Sleep study showed only mild OSA - improved (6/h)  from prior (15/h) So wt loss has definitely helped. OK to dc CPAP or if she is still symptomatic she can continue until some more wt loss , then stop

## 2018-08-28 DIAGNOSIS — R1011 Right upper quadrant pain: Secondary | ICD-10-CM | POA: Diagnosis not present

## 2018-08-28 DIAGNOSIS — E559 Vitamin D deficiency, unspecified: Secondary | ICD-10-CM | POA: Diagnosis not present

## 2018-08-28 DIAGNOSIS — Z9884 Bariatric surgery status: Secondary | ICD-10-CM | POA: Diagnosis not present

## 2018-09-30 ENCOUNTER — Encounter: Payer: BC Managed Care – PPO | Attending: Surgery | Admitting: Skilled Nursing Facility1

## 2018-09-30 ENCOUNTER — Other Ambulatory Visit: Payer: Self-pay

## 2018-09-30 DIAGNOSIS — E669 Obesity, unspecified: Secondary | ICD-10-CM | POA: Diagnosis not present

## 2018-09-30 NOTE — Patient Instructions (Addendum)
-  Aim for non starchy vegetables 2 times a day 7 days a week   -Try cooked broccoli   -Aim for 150 minutes a week of physical activity; 30 minutes most days of the week

## 2018-09-30 NOTE — Progress Notes (Signed)
Follow-up visit: Post-Operative Sleeve Gastrectomy Surgery  Medical Nutrition Therapy:  Appt start time: 3:40 end time:  4:30.  Primary concerns today: Post-operative Bariatric Surgery Nutrition Management.  Non scale victories: walking faster on treadmill, improve knee pain, able to move better, wearing smaller sizes, decreased pants by 3 sizes, improved feet issues    Pt states she is having trouble snacking at home because she is there so often and bored. Pt states she would like to get skin removal surgery in the future. Pt states she has recently been trying to reduce her cracker intake. Pt states her body is going through a lot of changes and is perimenopausal. Pt states she has been having numbing just on the top of her right foot. Pt states spagetti makes her sick.   Surgery date: 07/29/2017 Surgery type: Sleeve  Start weight at [facility]: 273.4 Weight today: 192.3 Weight loss goal: "get healthy, help with arthritis in knees, able to walk without pain, improve feet issues"   Body Composition Scale 09/30/2018  Total Body Fat % 37.4  Visceral Fat 10  Fat-Free Mass % 62.5   Total Body Water % 45.7   Muscle-Mass lbs 31.8  Body Fat Displacement          Torso  lbs 44.5         Left Leg  lbs 8.9         Right Leg  lbs 8.9         Left Arm  lbs 4.4         Right Arm   lbs 4.4    24-hr recall: 3 meals and 2-3 snacks 7 days a week B (AM): 2 eggs or yogurt Snk (AM):almonds or yogurt or applesauce  L (PM): tuna (15g) and crackers or salad with raisins sunflower seeds tomato cheese  Snk (PM): chips or cheese and crackers or peanut butter crackers  D (PM): [place] burger or hamburger  Snk (PM): cheese or sugar free pudding and chips  Fluid intake: water, coffee 2-3 cups;  Estimated total protein intake: 60+ grams  Medications: See list Supplementation: procare 1 a day and calcium and hair/skin/nail vitamin   Using straws: no Drinking while eating: no Having you been chewing well:  yes Chewing/swallowing difficulties: no Changes in vision: no Changes to mood/headaches: sometimes Hair loss/Changes to skin/Changes to nails: no, no, no Any difficulty focusing or concentrating: no Sweating: no Dizziness/Lightheaded: no Palpitations: no  Carbonated beverages: no N/V/D/C/GAS: no Abdominal Pain: no Dumping syndrome: no  Recent physical activity:  ADL's; some weights or squats in the house  Progress Towards Goal(s):  In progress.   Nutritional Diagnosis:  -3.3 Overweight/obesity related to past poor dietary habits and physical inactivity as evidenced by patient w/ recent sleeve gastrectomy surgery following dietary guidelines for continued weight loss.    Intervention:  Nutrition education and counseling. Pt was educated and counseled on how to track protein intake, trying new food items on the weekends, and embracing her journey. Pt was encouraged to keep up the great work especially with meeting fluid goals and having physical activity most days of the week.  Goals: -Try raw vegetables aging: chew until applesauce consistency  -Eat what you can in your lunch time at work and then finish the rest when you get time to prevent eating too quickly  -Focus your choices on whole nutrient dense foods  -Aim for non starchy vegetables 2 times a day 7 days a week  -Try cooked broccoli   -  Aim for 150 minutes a week of physical activity; 30 minutes most days of the week   Teaching Method Utilized:  Visual Auditory Hands on  Barriers to learning/adherence to lifestyle change: none identified  Demonstrated degree of understanding via:  Teach Back   Monitoring/Evaluation:  Dietary intake, exercise,  and body weight. Follow up

## 2018-10-02 ENCOUNTER — Ambulatory Visit: Payer: BLUE CROSS/BLUE SHIELD | Admitting: Skilled Nursing Facility1

## 2018-10-27 ENCOUNTER — Other Ambulatory Visit: Payer: Self-pay | Admitting: Family Medicine

## 2018-10-27 DIAGNOSIS — Z1231 Encounter for screening mammogram for malignant neoplasm of breast: Secondary | ICD-10-CM

## 2018-11-05 ENCOUNTER — Ambulatory Visit: Payer: Self-pay

## 2018-11-05 ENCOUNTER — Ambulatory Visit
Admission: EM | Admit: 2018-11-05 | Discharge: 2018-11-05 | Disposition: A | Discharge status: Home / Self Care | Payer: BC Managed Care – PPO

## 2018-11-05 DIAGNOSIS — I10 Essential (primary) hypertension: Secondary | ICD-10-CM | POA: Diagnosis not present

## 2018-11-05 DIAGNOSIS — R519 Headache, unspecified: Secondary | ICD-10-CM

## 2018-11-05 DIAGNOSIS — R51 Headache: Secondary | ICD-10-CM | POA: Diagnosis not present

## 2018-11-05 NOTE — ED Triage Notes (Signed)
Pt c/o headache since yesterday morning and her b/p is elevated. States has been off her meds for a year after weight loss

## 2018-11-05 NOTE — Discharge Instructions (Signed)
No alarming signs on exam. Check blood pressure twice a day. Keep hydrated, urine should be clear to pale yellow in color. Decrease salt intake. If continues to have elevated blood pressure after 3-5 days, can start HCTZ 12.5mg  daily for high blood pressure. Follow up with PCP for further evaluation if symptoms not improving and for further management of high blood pressure. If experiencing worse headache of your life, vomiting, confusion, one sided weakness, go to the ED for further evaluation needed.

## 2018-11-05 NOTE — Telephone Encounter (Signed)
Incoming call from Patient with complaint of Elevated Blood Pressure.  Reports the following.  166/105, 198/114 Hr 83.  Also reports Having headaches.   Uses automatic Machine.  Was on Medication. Reviewed Protocol and provided Care advise.  Patient voices understanding.  Will go to Urgent care States Patient.       Reason for Disposition . [7] Systolic BP  >= 673 OR Diastolic >= 419 AND [3] cardiac or neurologic symptoms (e.g., chest pain, difficulty breathing, unsteady gait, blurred vision)  Answer Assessment - Initial Assessment Questions 1. BLOOD PRESSURE: "What is the blood pressure?" "Did you take at least two measurements 5 minutes apart?"     166/105,,198/114 hr 83   187/119 hr89                2. ONSET: "When did you take your blood pressure?"     This morning 3. HOW: "How did you obtain the blood pressure?" (e.g., visiting nurse, automatic home BP monitor)     Automatic 4. HISTORY: "Do you have a history of high blood pressure?"     Yes was on medication 5. MEDICATIONS: "Are you taking any medications for blood pressure?" "Have you missed any doses recently?"     6. OTHER SYMPTOMS: "Do you have any symptoms?" (e.g., headache, chest pain, blurred vision, difficulty breathing, weakness)    Headaches,  7. PREGNANCY: "Is there any chance you are pregnant?" "When was your last menstrual period?"     premenopausal  Protocols used: HIGH BLOOD PRESSURE-A-AH

## 2018-11-05 NOTE — ED Provider Notes (Signed)
[facility]    CSN: [id number] Arrival date & time: 11/05/18  1111     History   Chief Complaint Chief Complaint  Patient presents with  . Hypertension    HPI Makayla Good is a 48 y.o. female.   48 year old female comes in for 2-day history of headache, hypertension.  Patient states she has a history of hypertension, after gastric bypass, she was able to go off medication.  For the past 2 days, she would wake up with a headache, with pain to the whole head.  She denies any photophobia, phonophobia, nausea, vomiting.  Denies head injury, loss of consciousness.  Denies URI symptoms such as cough, congestion, sore throat.  Denies weakness, dizziness.  Denies fever, chills, night sweats.  Denies chest pain, shortness of breath.  Given she has been waking up with headache, she checked her blood pressure, and has had high readings, reading today was 166/105.  She took [facility] powder and coffee with good relief of headache.  Given high blood pressure, came in for evaluation.  Patient states did discuss this with PCP, who suggested 0.5 tab of HCTZ. She also states has been told in the past that her salt intake can influence BP significantly. She had been eating salty food the past 2 days.      Past Medical History:  Diagnosis Date  . Asthma   . Eczema   . Fibroid   . H/O oophorectomy   . Hyperlipidemia   . Hypertension   . Obesity   . Polycystic ovary    Left  . Sleep apnea     Patient Active Problem List   Diagnosis Date Noted  . Essential hypertension 08/05/2017  . Hypokalemia 08/05/2017  . OSA (obstructive sleep apnea) 03/05/2017  . Asthma 03/05/2017  . Dizziness 02/08/2017  . Preventative health care 02/08/2017  . Abdominal pain 07/05/2016  . Right upper quadrant abdominal pain 07/05/2016  . Irregular periods 07/26/2014  . Dysuria 02/04/2014  . Severe obesity (BMI >= 40) ([place]) 10/21/2012  . OA (osteoarthritis) of knee 06/04/2012  . Cough 03/05/2012  .  Plantar fasciitis 09/04/2010  . HYPOKALEMIA 07/01/2009  . CHEST PAIN UNSPECIFIED 05/18/2009  . DEPRESSION 03/07/2009  . FATIGUE 03/07/2009  . TOBACCO USER 12/03/2008  . Morbid obesity ([place]) 05/21/2008  . Hyperlipidemia LDL goal <100 05/22/2007  . DYSMETABOLIC SYNDROME X 90/24/0973  . CONJUNCTIVITIS NOS 02/04/2007  . VAGINITIS NOS 11/07/2006  . FUNGAL DERMATITIS 10/09/2006  . Benign essential HTN 10/09/2006    Past Surgical History:  Procedure Laterality Date  . CESAREAN SECTION  02/01/2005  . CESAREAN SECTION  2001  . LAPAROSCOPIC GASTRIC SLEEVE RESECTION N/A 07/29/2017   Procedure: LAPAROSCOPIC GASTRIC SLEEVE RESECTION WITH UPPER ENDO ;  Surgeon: [name], MD;  Location: [facility];  Service: General;  Laterality: N/A;  . OOPHORECTOMY     Rt.removed  . TUBAL LIGATION  02/01/2005    OB History    Gravida  5   Para  2   Term      Preterm      AB  3   Living  2     SAB  2   TAB  1   Ectopic      Multiple      Live Births               Home Medications    Prior to Admission medications   Medication Sig Start Date End Date Taking? Authorizing Provider  Calcium Citrate-Vitamin D (CALCIUM CITRATE + D PO) Take 1 each by mouth daily.    [provider]  hydrochlorothiazide (HYDRODIURIL) 25 MG tablet Take 1 tablet (25 mg total) by mouth daily. Patient taking differently: Take 25 mg by mouth as needed.  09/26/17   [name], DO  Multiple Vitamins-Minerals (CELEBRATE MULTI-COMPLETE 36 PO) Take 1 each by mouth daily.    [provider]  pantoprazole (PROTONIX) 40 MG tablet Take 1 tablet (40 mg total) by mouth daily. 01/14/18   [name], DO  vitamin B-12 (CYANOCOBALAMIN) 100 MCG tablet Take 100 mcg by mouth daily.    [provider]    Family History Family History  Problem Relation Age of Onset  . Hypertension Mother   . Schizophrenia Mother   . Dementia Mother   . Mental illness Mother         schizophrenia, dementia  . Hypertension Father   . Coronary artery disease Father        Stent  . Hypertension Sister   . Hypertension Sister   . Arthritis Sister        rheumatoid  . Dementia Maternal Aunt   . Kidney disease Other     Social History Social History   Tobacco Use  . Smoking status: Current Every Day Smoker    Packs/day: 0.50    Years: 16.00    Pack years: 8.00    Types: Cigarettes    Start date: 03/23/1993  . Smokeless tobacco: Never Used  Substance Use Topics  . Alcohol use: Yes    Alcohol/week: 1.0 standard drinks    Types: 1 Glasses of wine per week    Comment: occasional  . Drug use: No     Allergies   Patient has no known allergies.   Review of Systems Review of Systems  Reason unable to perform ROS: See HPI as above.     Physical Exam Triage Vital Signs ED Triage Vitals  Enc Vitals Group     BP 11/05/18 1137 (!) 191/98     Pulse Rate 11/05/18 1137 77     Resp 11/05/18 1137 20     Temp 11/05/18 1137 98.3 F (36.8 C)     Temp Source 11/05/18 1137 Oral     SpO2 11/05/18 1137 97 %     Weight --      Height --      Head Circumference --      Peak Flow --      Pain Score 11/05/18 1138 0     Pain Loc --      Pain Edu? --      Excl. in [place]? --    No data found.  Updated Vital Signs BP (!) 159/84 (BP Location: Left Arm)   Pulse 77   Temp 98.3 F (36.8 C) (Oral)   Resp 20   LMP 10/24/2018   SpO2 97%   Physical Exam Constitutional:      General: She is not in acute distress.    Appearance: Normal appearance. She is not ill-appearing, toxic-appearing or diaphoretic.  HENT:     Head: Normocephalic and atraumatic.     Mouth/Throat:     Mouth: Mucous membranes are moist.     Pharynx: Oropharynx is clear. Uvula midline.  Eyes:     Extraocular Movements: Extraocular movements intact.     Conjunctiva/sclera: Conjunctivae normal.     Pupils: Pupils are equal, round, and reactive to light.  Neck:     Musculoskeletal: Normal range  of motion and neck supple.  Cardiovascular:     Rate and Rhythm: Normal rate and regular rhythm.     Heart sounds: Normal heart sounds. No murmur. No friction rub. No gallop.   Pulmonary:     Effort: Pulmonary effort is normal. No accessory muscle usage, prolonged expiration, respiratory distress or retractions.     Comments: Lungs clear to auscultation without adventitious lung sounds. Neurological:     General: No focal deficit present.     Mental Status: She is alert and oriented to person, place, and time.     GCS: GCS eye subscore is 4. GCS verbal subscore is 5. GCS motor subscore is 6.     Cranial Nerves: Cranial nerves are intact.     Sensory: Sensation is intact.     Motor: Motor function is intact.     Coordination: Coordination is intact.     Gait: Gait is intact.      UC Treatments / Results  Labs (all labs ordered are listed, but only abnormal results are displayed) Labs Reviewed - No data to display  EKG   Radiology No results found.  Procedures Procedures (including critical care time)  Medications Ordered in UC Medications - No data to display  Initial Impression / Assessment and Plan / UC Course  I have reviewed the triage vital signs and the nursing notes.  Pertinent labs & imaging results that were available during my care of the patient were reviewed by me and considered in my medical decision making (see chart for details).    Patient was hypertensive at 191/98 during triage.  At the time, states no significant headache.  Had denied chest pain, shortness of breath, vision changes, confusion, weakness, dizziness.  After rest, BP decreased to 159/84.  Patient with blood pressure cuff at home.  States can decrease salt intake, push fluids and recheck BP for the next few days.  If blood pressure continues to be elevated, to start HCTZ as per PCP.  Will have patient keep blood pressure log.  Follow-up with PCP for further evaluation and management needed.   Return precautions given.  Patient expresses understanding and agrees to plan.   Final Clinical Impressions(s) / UC Diagnoses   Final diagnoses:  Acute intractable headache, unspecified headache type  Hypertension, unspecified type    ED Prescriptions    None        Ok Edwards, PA-C 11/05/18 1407

## 2018-12-04 ENCOUNTER — Other Ambulatory Visit: Payer: Self-pay

## 2018-12-04 ENCOUNTER — Ambulatory Visit
Admission: RE | Admit: 2018-12-04 | Discharge: 2018-12-04 | Disposition: A | Discharge status: Home / Self Care | Payer: BC Managed Care – PPO | Source: Ambulatory Visit | Attending: Family Medicine | Admitting: Family Medicine

## 2018-12-04 DIAGNOSIS — Z1231 Encounter for screening mammogram for malignant neoplasm of breast: Secondary | ICD-10-CM

## 2019-01-05 ENCOUNTER — Other Ambulatory Visit: Payer: Self-pay

## 2019-01-05 ENCOUNTER — Encounter (HOSPITAL_COMMUNITY): Payer: Self-pay

## 2019-01-05 ENCOUNTER — Emergency Department (HOSPITAL_COMMUNITY): Payer: BC Managed Care – PPO

## 2019-01-05 DIAGNOSIS — J45909 Unspecified asthma, uncomplicated: Secondary | ICD-10-CM | POA: Diagnosis not present

## 2019-01-05 DIAGNOSIS — I10 Essential (primary) hypertension: Secondary | ICD-10-CM | POA: Insufficient documentation

## 2019-01-05 DIAGNOSIS — F17210 Nicotine dependence, cigarettes, uncomplicated: Secondary | ICD-10-CM | POA: Diagnosis not present

## 2019-01-05 DIAGNOSIS — R072 Precordial pain: Secondary | ICD-10-CM | POA: Diagnosis not present

## 2019-01-05 DIAGNOSIS — R079 Chest pain, unspecified: Secondary | ICD-10-CM | POA: Diagnosis not present

## 2019-01-05 DIAGNOSIS — Z79899 Other long term (current) drug therapy: Secondary | ICD-10-CM | POA: Insufficient documentation

## 2019-01-05 LAB — CBC
HCT: 38.4 % (ref 36.0–46.0)
Hemoglobin: 12.5 g/dL (ref 12.0–15.0)
MCH: 31.6 pg (ref 26.0–34.0)
MCHC: 32.6 g/dL (ref 30.0–36.0)
MCV: 97.2 fL (ref 80.0–100.0)
Platelets: 150 10*3/uL (ref 150–400)
RBC: 3.95 MIL/uL (ref 3.87–5.11)
RDW: 13.5 % (ref 11.5–15.5)
WBC: 6.8 10*3/uL (ref 4.0–10.5)
nRBC: 0 % (ref 0.0–0.2)

## 2019-01-05 MED ORDER — SODIUM CHLORIDE 0.9% FLUSH: 3.0000 mL | Freq: Once | INTRAVENOUS | Status: DC

## 2019-01-05 NOTE — ED Triage Notes (Signed)
Pt reports L sided chest pain that started earlier this evening. She denies any aggravating factors and states that nothing makes it better. Denies cough, N/V or SOB. A&Ox4. Pt has untreated HTN. Ambulatory.

## 2019-01-06 ENCOUNTER — Emergency Department (HOSPITAL_COMMUNITY)
Admission: EM | Admit: 2019-01-06 | Discharge: 2019-01-06 | Disposition: A | Discharge status: Home / Self Care | Payer: BC Managed Care – PPO | Attending: Emergency Medicine | Admitting: Emergency Medicine

## 2019-01-06 DIAGNOSIS — R079 Chest pain, unspecified: Secondary | ICD-10-CM

## 2019-01-06 DIAGNOSIS — I10 Essential (primary) hypertension: Secondary | ICD-10-CM

## 2019-01-06 DIAGNOSIS — R600 Localized edema: Secondary | ICD-10-CM

## 2019-01-06 LAB — BASIC METABOLIC PANEL
Anion gap: 11 (ref 5–15)
BUN: 14 mg/dL (ref 6–20)
CO2: 25 mmol/L (ref 22–32)
Calcium: 9.3 mg/dL (ref 8.9–10.3)
Chloride: 105 mmol/L (ref 98–111)
Creatinine, Ser: 0.59 mg/dL (ref 0.44–1.00)
GFR calc Af Amer: >60 mL/min (ref >60)
GFR calc non Af Amer: >60 mL/min (ref >60)
Glucose, Bld: 101 mg/dL — ABNORMAL HIGH (ref 70–99)
Potassium: 3.5 mmol/L (ref 3.5–5.1)
Sodium: 141 mmol/L (ref 135–145)

## 2019-01-06 LAB — TROPONIN I (HIGH SENSITIVITY)
Troponin I (High Sensitivity): 2 ng/L (ref <18)
Troponin I (High Sensitivity): <2 ng/L (ref <18)

## 2019-01-06 MED ORDER — ASPIRIN 81 MG PO CHEW
324.0000 mg | CHEWABLE_TABLET | Freq: Once | ORAL | Status: AC
  Administered 2019-01-06: 324 mg via ORAL
  Filled 2019-01-06: qty 4

## 2019-01-06 MED ORDER — HYDROCHLOROTHIAZIDE 12.5 MG PO CAPS
12.5000 mg | ORAL_CAPSULE | Freq: Once | ORAL | Status: AC
  Administered 2019-01-06: 12.5 mg via ORAL
  Filled 2019-01-06: qty 1

## 2019-01-06 MED ORDER — ALUM & MAG HYDROXIDE-SIMETH 200-200-20 MG/5ML PO SUSP
30.0000 mL | Freq: Once | ORAL | Status: AC
  Administered 2019-01-06: 03:00:00 30 mL via ORAL
  Filled 2019-01-06: qty 30

## 2019-01-06 MED ORDER — HYDROCHLOROTHIAZIDE 25 MG PO TABS: 12.5000 mg | ORAL_TABLET | Freq: Every day | ORAL | 0 refills | Status: DC

## 2019-01-06 NOTE — ED Provider Notes (Signed)
[facility] DEPT Provider Note   CSN: [id number] Arrival date & time: 01/05/19  2210     History   Chief Complaint Chief Complaint  Patient presents with  . Chest Pain    HPI Makayla Good is a 48 y.o. female.      Chest Pain Pain location:  Substernal area, L lateral chest and L chest Pain quality: aching and sharp   Pain radiates to:  L shoulder Pain severity:  Mild Onset quality:  Gradual Timing:  Constant Progression:  Worsening   Past Medical History:  Diagnosis Date  . Asthma   . Eczema   . Fibroid   . H/O oophorectomy   . Hyperlipidemia   . Hypertension   . Obesity   . Polycystic ovary    Left  . Sleep apnea     Patient Active Problem List   Diagnosis Date Noted  . Essential hypertension 08/05/2017  . Hypokalemia 08/05/2017  . OSA (obstructive sleep apnea) 03/05/2017  . Asthma 03/05/2017  . Dizziness 02/08/2017  . Preventative health care 02/08/2017  . Abdominal pain 07/05/2016  . Right upper quadrant abdominal pain 07/05/2016  . Irregular periods 07/26/2014  . Dysuria 02/04/2014  . Severe obesity (BMI >= 40) ([place]) 10/21/2012  . OA (osteoarthritis) of knee 06/04/2012  . Cough 03/05/2012  . Plantar fasciitis 09/04/2010  . HYPOKALEMIA 07/01/2009  . CHEST PAIN UNSPECIFIED 05/18/2009  . DEPRESSION 03/07/2009  . FATIGUE 03/07/2009  . TOBACCO USER 12/03/2008  . Morbid obesity ([place]) 05/21/2008  . Hyperlipidemia LDL goal <100 05/22/2007  . DYSMETABOLIC SYNDROME X [id number]  . CONJUNCTIVITIS NOS 02/04/2007  . VAGINITIS NOS 11/07/2006  . FUNGAL DERMATITIS 10/09/2006  . Benign essential HTN 10/09/2006    Past Surgical History:  Procedure Laterality Date  . BREAST BIOPSY Right 2019   fibroadenoma  . CESAREAN SECTION  02/01/2005  . CESAREAN SECTION  2001  . LAPAROSCOPIC GASTRIC SLEEVE RESECTION N/A 07/29/2017   Procedure: LAPAROSCOPIC GASTRIC SLEEVE RESECTION WITH UPPER ENDO ;  Surgeon: [name], MD;  Location: [facility] ORS;  Service: General;  Laterality: N/A;  . OOPHORECTOMY     Rt.removed  . TUBAL LIGATION  02/01/2005     OB History    Gravida  5   Para  2   Term      Preterm      AB  3   Living  2     SAB  2   TAB  1   Ectopic      Multiple      Live Births               Home Medications    Prior to Admission medications   Medication Sig Start Date End Date Taking? Authorizing Provider  amoxicillin (AMOXIL) 500 MG capsule Take 500 mg by mouth 3 (three) times daily. 01/05/19  Yes [provider]  Biotin w/ Vitamins C & E (HAIR/SKIN/NAILS PO) Take 1 tablet by mouth daily.   Yes [provider]  Multiple Vitamins-Minerals (CELEBRATE MULTI-COMPLETE 36 PO) Take 1 each by mouth daily.   Yes [provider]  hydrochlorothiazide (HYDRODIURIL) 25 MG tablet Take 0.5 tablets (12.5 mg total) by mouth daily. 01/06/19   Benigno Check, Corene Cornea, MD  pantoprazole (PROTONIX) 40 MG tablet Take 1 tablet (40 mg total) by mouth daily. Patient not taking: Reported on 01/06/2019 01/14/18   [name], DO    Family History Family History  Problem Relation  Age of Onset  . Hypertension Mother   . Schizophrenia Mother   . Dementia Mother   . Mental illness Mother        schizophrenia, dementia  . Hypertension Father   . Coronary artery disease Father        Stent  . Hypertension Sister   . Hypertension Sister   . Arthritis Sister        rheumatoid  . Dementia Maternal Aunt   . Kidney disease Other     Social History Social History   Tobacco Use  . Smoking status: Current Every Day Smoker    Packs/day: 0.50    Years: 16.00    Pack years: 8.00    Types: Cigarettes    Start date: 03/23/1993  . Smokeless tobacco: Never Used  Substance Use Topics  . Alcohol use: Yes    Alcohol/week: 1.0 standard drinks    Types: 1 Glasses of wine per week    Comment: occasional  . Drug use: No     Allergies   Patient has no known  allergies.   Review of Systems Review of Systems  Cardiovascular: Positive for chest pain.  All other systems reviewed and are negative.    Physical Exam Updated Vital Signs BP (!) 141/84   Pulse 70   Temp 98.1 F (36.7 C) (Oral)   Resp 19   Ht 5' 5.5" (1.664 m)   Wt 85.7 kg   SpO2 100%   BMI 30.97 kg/m   Physical Exam Vitals signs and nursing note reviewed.  Constitutional:      Appearance: She is well-developed.  HENT:     Head: Normocephalic and atraumatic.  Neck:     Musculoskeletal: Normal range of motion.  Cardiovascular:     Rate and Rhythm: Normal rate and regular rhythm.  Pulmonary:     Effort: No respiratory distress.     Breath sounds: No stridor. No decreased breath sounds.  Abdominal:     General: There is no distension.  Musculoskeletal: Normal range of motion.     Right lower leg: No edema.     Left lower leg: No edema.  Skin:    General: Skin is warm.     Coloration: Skin is not cyanotic.  Neurological:     Mental Status: She is alert.      ED Treatments / Results  Labs (all labs ordered are listed, but only abnormal results are displayed) Labs Reviewed  BASIC METABOLIC PANEL - Abnormal; Notable for the following components:      Result Value   Glucose, Bld 101 (*)    All other components within normal limits  CBC  POC URINE PREG, ED  TROPONIN I (HIGH SENSITIVITY)  TROPONIN I (HIGH SENSITIVITY)    EKG EKG Interpretation  Date/Time:  Monday January 05 2019 23:09:31 EDT Ventricular Rate:  72 PR Interval:    QRS Duration: 99 QT Interval:  398 QTC Calculation: 436 R Axis:   50 Text Interpretation:  Sinus rhythm No significant change since last tracing Confirmed by Merrily Pew [phone]) on 01/06/2019 1:41:33 AM   Radiology Dg Chest 2 View  Result Date: 01/05/2019 CLINICAL DATA:  48 year old female with left side chest pain radiating to the left axilla onset this evening. EXAM: CHEST - 2 VIEW COMPARISON:  Chest radiographs  11/28/2016 and earlier. FINDINGS: PA and lateral views. Stable cardiac size at the upper limits of normal to mildly enlarged. Other mediastinal contours are within normal limits. Visualized tracheal air  column is within normal limits. Both lungs are clear. No pneumothorax or pleural effusion. No acute osseous abnormality identified. Negative visible bowel gas pattern. IMPRESSION: No acute cardiopulmonary abnormality. Chronic borderline to mild cardiomegaly. Electronically Signed   By: Genevie Ann M.D.   On: 01/05/2019 23:53    Procedures Procedures (including critical care time)  Medications Ordered in ED Medications  sodium chloride flush (NS) 0.9 % injection 3 mL (has no administration in time range)  aspirin chewable tablet 324 mg (324 mg Oral Given 01/06/19 0127)  alum & mag hydroxide-simeth (MAALOX/MYLANTA) 200-200-20 MG/5ML suspension 30 mL (30 mLs Oral Given 01/06/19 0301)  hydrochlorothiazide (MICROZIDE) capsule 12.5 mg (12.5 mg Oral Given 01/06/19 0301)     Initial Impression / Assessment and Plan / ED Course  I have reviewed the triage vital signs and the nursing notes.  Pertinent labs & imaging results that were available during my care of the patient were reviewed by me and considered in my medical decision making (see chart for details).   Likely muscual CP vs htn related. Will start her back on daily HCTZ. Pain improved here. Workup normal here. Stable for dc w/ outpatient follow up.  Final Clinical Impressions(s) / ED Diagnoses   Final diagnoses:  Nonspecific chest pain  Hypertension, unspecified type    ED Discharge Orders         Ordered    hydrochlorothiazide (HYDRODIURIL) 25 MG tablet  Daily     01/06/19 0501           Makayah Pauli, Corene Cornea, MD 01/06/19 0715

## 2019-02-12 ENCOUNTER — Other Ambulatory Visit: Payer: Self-pay

## 2019-02-13 ENCOUNTER — Encounter: Payer: Self-pay | Admitting: Family Medicine

## 2019-02-13 ENCOUNTER — Ambulatory Visit (INDEPENDENT_AMBULATORY_CARE_PROVIDER_SITE_OTHER): Payer: BC Managed Care – PPO | Admitting: Family Medicine

## 2019-02-13 VITALS — BP 138/88 | HR 79 | Temp 97.8°F | Resp 18 | Ht 65.5 in | Wt 189.8 lb

## 2019-02-13 DIAGNOSIS — I10 Essential (primary) hypertension: Secondary | ICD-10-CM | POA: Diagnosis not present

## 2019-02-13 DIAGNOSIS — Z1211 Encounter for screening for malignant neoplasm of colon: Secondary | ICD-10-CM | POA: Diagnosis not present

## 2019-02-13 DIAGNOSIS — R600 Localized edema: Secondary | ICD-10-CM | POA: Diagnosis not present

## 2019-02-13 DIAGNOSIS — Z0000 Encounter for general adult medical examination without abnormal findings: Secondary | ICD-10-CM | POA: Diagnosis not present

## 2019-02-13 DIAGNOSIS — Z720 Tobacco use: Secondary | ICD-10-CM

## 2019-02-13 LAB — CBC WITH DIFFERENTIAL/PLATELET
Basophils Absolute: 0 10*3/uL (ref 0.0–0.1)
Basophils Relative: 0.8 % (ref 0.0–3.0)
Eosinophils Absolute: 0.1 10*3/uL (ref 0.0–0.7)
Eosinophils Relative: 2.1 % (ref 0.0–5.0)
HCT: 39.3 % (ref 36.0–46.0)
Hemoglobin: 13.2 g/dL (ref 12.0–15.0)
Lymphocytes Relative: 56 % — ABNORMAL HIGH (ref 12.0–46.0)
Lymphs Abs: 3.2 10*3/uL (ref 0.7–4.0)
MCHC: 33.5 g/dL (ref 30.0–36.0)
MCV: 96.3 fl (ref 78.0–100.0)
Monocytes Absolute: 0.5 10*3/uL (ref 0.1–1.0)
Monocytes Relative: 7.9 % (ref 3.0–12.0)
Neutro Abs: 1.9 10*3/uL (ref 1.4–7.7)
Neutrophils Relative %: 33.2 % — ABNORMAL LOW (ref 43.0–77.0)
Platelets: 183 10*3/uL (ref 150.0–400.0)
RBC: 4.08 Mil/uL (ref 3.87–5.11)
RDW: 14.5 % (ref 11.5–15.5)
WBC: 5.8 10*3/uL (ref 4.0–10.5)

## 2019-02-13 LAB — COMPREHENSIVE METABOLIC PANEL
ALT: 23 U/L (ref 0–35)
AST: 22 U/L (ref 0–37)
Albumin: 4.3 g/dL (ref 3.5–5.2)
Alkaline Phosphatase: 70 U/L (ref 39–117)
BUN: 15 mg/dL (ref 6–23)
CO2: 29 mEq/L (ref 19–32)
Calcium: 9.5 mg/dL (ref 8.4–10.5)
Chloride: 103 mEq/L (ref 96–112)
Creatinine, Ser: 0.7 mg/dL (ref 0.40–1.20)
GFR: 107.95 mL/min (ref >60.00)
Glucose, Bld: 80 mg/dL (ref 70–99)
Potassium: 4 mEq/L (ref 3.5–5.1)
Sodium: 139 mEq/L (ref 135–145)
Total Bilirubin: 0.5 mg/dL (ref 0.2–1.2)
Total Protein: 7 g/dL (ref 6.0–8.3)

## 2019-02-13 LAB — LIPID PANEL
Cholesterol: 185 mg/dL (ref 0–200)
HDL: 60.1 mg/dL (ref >39.00)
LDL Cholesterol: 107 mg/dL — ABNORMAL HIGH (ref 0–99)
NonHDL: 124.97
Total CHOL/HDL Ratio: 3
Triglycerides: 89 mg/dL (ref 0.0–149.0)
VLDL: 17.8 mg/dL (ref 0.0–40.0)

## 2019-02-13 LAB — VITAMIN D 25 HYDROXY (VIT D DEFICIENCY, FRACTURES): VITD: 39.67 ng/mL (ref 30.00–100.00)

## 2019-02-13 LAB — TSH: TSH: 1.71 u[IU]/mL (ref 0.35–4.50)

## 2019-02-13 MED ORDER — HYDROCHLOROTHIAZIDE 25 MG PO TABS: 12.5000 mg | ORAL_TABLET | Freq: Every day | ORAL | 1 refills | Status: DC

## 2019-02-13 MED ORDER — CHANTIX STARTING MONTH PAK 0.5 MG X 11 & 1 MG X 42 PO TABS: ORAL_TABLET | ORAL | 0 refills | Status: DC

## 2019-02-13 NOTE — Patient Instructions (Addendum)
Preventive Care 40-48 Years Old, Female Preventive care refers to visits with your health care provider and lifestyle choices that can promote health and wellness. This includes:  A yearly physical exam. This may also be called an annual well check.  Regular dental visits and eye exams.  Immunizations.  Screening for certain conditions.  Healthy lifestyle choices, such as eating a healthy diet, getting regular exercise, not using drugs or products that contain nicotine and tobacco, and limiting alcohol use. What can I expect for my preventive care visit? Physical exam Your health care provider will check your:  Height and weight. This may be used to calculate body mass index (BMI), which tells if you are at a healthy weight.  Heart rate and blood pressure.  Skin for abnormal spots. Counseling Your health care provider may ask you questions about your:  Alcohol, tobacco, and drug use.  Emotional well-being.  Home and relationship well-being.  Sexual activity.  Eating habits.  Work and work environment.  Method of birth control.  Menstrual cycle.  Pregnancy history. What immunizations do I need?  Influenza (flu) vaccine  This is recommended every year. Tetanus, diphtheria, and pertussis (Tdap) vaccine  You may need a Td booster every 10 years. Varicella (chickenpox) vaccine  You may need this if you have not been vaccinated. Zoster (shingles) vaccine  You may need this after age 60. Measles, mumps, and rubella (MMR) vaccine  You may need at least one dose of MMR if you were born in 1957 or later. You may also need a second dose. Pneumococcal conjugate (PCV13) vaccine  You may need this if you have certain conditions and were not previously vaccinated. Pneumococcal polysaccharide (PPSV23) vaccine  You may need one or two doses if you smoke cigarettes or if you have certain conditions. Meningococcal conjugate (MenACWY) vaccine  You may need this if you  have certain conditions. Hepatitis A vaccine  You may need this if you have certain conditions or if you travel or work in places where you may be exposed to hepatitis A. Hepatitis B vaccine  You may need this if you have certain conditions or if you travel or work in places where you may be exposed to hepatitis B. Haemophilus influenzae type b (Hib) vaccine  You may need this if you have certain conditions. Human papillomavirus (HPV) vaccine  If recommended by your health care provider, you may need three doses over 6 months. You may receive vaccines as individual doses or as more than one vaccine together in one shot (combination vaccines). Talk with your health care provider about the risks and benefits of combination vaccines. What tests do I need? Blood tests  Lipid and cholesterol levels. These may be checked every 5 years, or more frequently if you are over 50 years old.  Hepatitis C test.  Hepatitis B test. Screening  Lung cancer screening. You may have this screening every year starting at age 55 if you have a 30-pack-year history of smoking and currently smoke or have quit within the past 15 years.  Colorectal cancer screening. All adults should have this screening starting at age 50 and continuing until age 75. Your health care provider may recommend screening at age 45 if you are at increased risk. You will have tests every 1-10 years, depending on your results and the type of screening test.  Diabetes screening. This is done by checking your blood sugar (glucose) after you have not eaten for a while (fasting). You may have this   done every 1-3 years.  Mammogram. This may be done every 1-2 years. Talk with your health care provider about when you should start having regular mammograms. This may depend on whether you have a family history of breast cancer.  BRCA-related cancer screening. This may be done if you have a family history of breast, ovarian, tubal, or peritoneal  cancers.  Pelvic exam and Pap test. This may be done every 3 years starting at age 41. Starting at age 45, this may be done every 5 years if you have a Pap test in combination with an HPV test. Other tests  Sexually transmitted disease (STD) testing.  Bone density scan. This is done to screen for osteoporosis. You may have this scan if you are at high risk for osteoporosis. Follow these instructions at home: Eating and drinking  Eat a diet that includes fresh fruits and vegetables, whole grains, lean protein, and low-fat dairy.  Take vitamin and mineral supplements as recommended by your health care provider.  Do not drink alcohol if: ? Your health care provider tells you not to drink. ? You are pregnant, may be pregnant, or are planning to become pregnant.  If you drink alcohol: ? Limit how much you have to 0-1 drink a day. ? Be aware of how much alcohol is in your drink. In the [place] one drink equals one 12 oz bottle of beer (355 mL), one 5 oz glass of wine (148 mL), or one 1 oz glass of hard liquor (44 mL). Lifestyle  Take daily care of your teeth and gums.  Stay active. Exercise for at least 30 minutes on 5 or more days each week.  Do not use any products that contain nicotine or tobacco, such as cigarettes, e-cigarettes, and chewing tobacco. If you need help quitting, ask your health care provider.  If you are sexually active, practice safe sex. Use a condom or other form of birth control (contraception) in order to prevent pregnancy and STIs (sexually transmitted infections).  If told by your health care provider, take low-dose aspirin daily starting at age 3. What's next?  Visit your health care provider once a year for a well check visit.  Ask your health care provider how often you should have your eyes and teeth checked.  Stay up to date on all vaccines. This information is not intended to replace advice given to you by your health care provider. Make sure you  discuss any questions you have with your health care provider. Document Released: 05/06/2015 Document Revised: 12/19/2017 Document Reviewed: 12/19/2017 Elsevier Patient Education  2020 [place] with Quitting Smoking  Quitting smoking is a physical and mental challenge. You will face cravings, withdrawal symptoms, and temptation. Before quitting, work with your health care provider to make a plan that can help you cope. Preparation can help you quit and keep you from giving in. How can I cope with cravings? Cravings usually last for 5-10 minutes. If you get through it, the craving will pass. Consider taking the following actions to help you cope with cravings:  Keep your mouth busy: ? Chew sugar-free gum. ? Suck on hard candies or a straw. ? Brush your teeth.  Keep your hands and body busy: ? Immediately change to a different activity when you feel a craving. ? Squeeze or play with a ball. ? Do an activity or a hobby, like making bead jewelry, practicing needlepoint, or working with wood. ? Mix up your normal routine. ? Take  a short exercise break. Go for a quick walk or run up and down stairs. ? Spend time in public places where smoking is not allowed.  Focus on doing something kind or helpful for someone else.  Call a friend or family member to talk during a craving.  Join a support group.  Call a quit line, such as 1-800-QUIT-NOW.  Talk with your health care provider about medicines that might help you cope with cravings and make quitting easier for you. How can I deal with withdrawal symptoms? Your body may experience negative effects as it tries to get used to not having nicotine in the system. These effects are called withdrawal symptoms. They may include:  Feeling hungrier than normal.  Trouble concentrating.  Irritability.  Trouble sleeping.  Feeling depressed.  Restlessness and agitation.  Craving a cigarette. To manage withdrawal symptoms:   Avoid places, people, and activities that trigger your cravings.  Remember why you want to quit.  Get plenty of sleep.  Avoid coffee and other caffeinated drinks. These may worsen some of your symptoms. How can I handle social situations? Social situations can be difficult when you are quitting smoking, especially in the first few weeks. To manage this, you can:  Avoid parties, bars, and other social situations where people might be smoking.  Avoid alcohol.  Leave right away if you have the urge to smoke.  Explain to your family and friends that you are quitting smoking. Ask for understanding and support.  Plan activities with friends or family where smoking is not an option. What are some ways I can cope with stress? Wanting to smoke may cause stress, and stress can make you want to smoke. Find ways to manage your stress. Relaxation techniques can help. For example:  Breathe slowly and deeply, in through your nose and out through your mouth.  Listen to soothing, relaxing music.  Talk with a family member or friend about your stress.  Light a candle.  Soak in a bath or take a shower.  Think about a peaceful place. What are some ways I can prevent weight gain? Be aware that many people gain weight after they quit smoking. However, not everyone does. To keep from gaining weight, have a plan in place before you quit and stick to the plan after you quit. Your plan should include:  Having healthy snacks. When you have a craving, it may help to: ? Eat plain popcorn, crunchy carrots, celery, or other cut vegetables. ? Chew sugar-free gum.  Changing how you eat: ? Eat small portion sizes at meals. ? Eat 4-6 small meals throughout the day instead of 1-2 large meals a day. ? Be mindful when you eat. Do not watch television or do other things that might distract you as you eat.  Exercising regularly: ? Make time to exercise each day. If you do not have time for a long workout, do  short bouts of exercise for 5-10 minutes several times a day. ? Do some form of strengthening exercise, like weight lifting, and some form of aerobic exercise, like running or swimming.  Drinking plenty of water or other low-calorie or no-calorie drinks. Drink 6-8 glasses of water daily, or as much as instructed by your health care provider. Summary  Quitting smoking is a physical and mental challenge. You will face cravings, withdrawal symptoms, and temptation to smoke again. Preparation can help you as you go through these challenges.  You can cope with cravings by keeping your mouth busy (  such as by chewing gum), keeping your body and hands busy, and making calls to family, friends, or a helpline for people who want to quit smoking.  You can cope with withdrawal symptoms by avoiding places where people smoke, avoiding drinks with caffeine, and getting plenty of rest.  Ask your health care provider about the different ways to prevent weight gain, avoid stress, and handle social situations. This information is not intended to replace advice given to you by your health care provider. Make sure you discuss any questions you have with your health care provider. Document Released: 04/06/2016 Document Revised: 03/22/2017 Document Reviewed: 04/06/2016 Elsevier Patient Education  2020 [employer].

## 2019-02-13 NOTE — Progress Notes (Signed)
Subjective:     Makayla Good is a 48 y.o. female and is here for a comprehensive physical exam. The patient reports no problems.  Social History   Socioeconomic History  . Marital status: Married    Spouse name: Not on file  . Number of children: Not on file  . Years of education: 89  . Highest education level: Not on file  Occupational History  . Occupation: release Horticulturist, commercial: [employer]  Social Needs  . Financial resource strain: Not on file  . Food insecurity    Worry: Sometimes true    Inability: Never true  . Transportation needs    Medical: Not on file    Non-medical: Not on file  Tobacco Use  . Smoking status: Current Every Day Smoker    Packs/day: 0.50    Years: 16.00    Pack years: 8.00    Types: Cigarettes    Start date: 03/23/1993  . Smokeless tobacco: Never Used  Substance and Sexual Activity  . Alcohol use: Yes    Alcohol/week: 1.0 standard drinks    Types: 1 Glasses of wine per week    Comment: occasional  . Drug use: No  . Sexual activity: Yes    Partners: Male    Birth control/protection: Surgical    Comment: Tubal/been with same person over 20 years  Lifestyle  . Physical activity    Days per week: Not on file    Minutes per session: Not on file  . Stress: Not on file  Relationships  . Social Herbalist on phone: Not on file    Gets together: Not on file    Attends religious service: Not on file    Active member of club or organization: Not on file    Attends meetings of clubs or organizations: Not on file    Relationship status: Not on file  . Intimate partner violence    Fear of current or ex partner: Not on file    Emotionally abused: Not on file    Physically abused: Not on file    Forced sexual activity: Not on file  Other Topics Concern  . Not on file  Social History Narrative   Exercise--gym, every other day--- at least 3 days a week   Health Maintenance  Topic Date Due  . MAMMOGRAM   12/04/2019  . PAP SMEAR-Modifier  05/22/2021  . TETANUS/TDAP  09/03/2021  . INFLUENZA VACCINE  Completed  . HIV Screening  Completed    The following portions of the patient's history were reviewed and updated as appropriate: She  has a past medical history of Asthma, Eczema, Fibroid, H/O oophorectomy, Hyperlipidemia, Hypertension, Obesity, Polycystic ovary, and Sleep apnea. She does not have any pertinent problems on file. She  has a past surgical history that includes Cesarean section (02/01/2005); Tubal ligation (02/01/2005); Cesarean section (2001); Laparoscopic gastric sleeve resection (N/A, 07/29/2017); Oophorectomy; and Breast biopsy (Right, 2019). Her family history includes Arthritis in her sister; Coronary artery disease in her father; Dementia in her maternal aunt and mother; Hypertension in her father, mother, sister, and sister; Kidney disease in an other family member; Mental illness in her mother; Schizophrenia in her mother. She  reports that she has been smoking cigarettes. She started smoking about 25 years ago. She has a 8.00 pack-year smoking history. She has never used smokeless tobacco. She reports current alcohol use of about 1.0 standard drinks of alcohol  per week. She reports that she does not use drugs. She has a current medication list which includes the following prescription(s): biotin w/ vitamins c & e, hydrochlorothiazide, multiple vitamins-minerals, and chantix starting month pak. Current Outpatient Medications on File Prior to Visit  Medication Sig Dispense Refill  . Biotin w/ Vitamins C & E (HAIR/SKIN/NAILS PO) Take 1 tablet by mouth daily.    . Multiple Vitamins-Minerals (CELEBRATE MULTI-COMPLETE 36 PO) Take 1 each by mouth daily.     No current facility-administered medications on file prior to visit.    She has No Known Allergies..  Review of Systems Review of Systems  Constitutional: Negative for activity change, appetite change and fatigue.  HENT:  Negative for hearing loss, congestion, tinnitus and ear discharge.  dentist q33m Eyes: Negative for visual disturbance (see optho q1y -- vision corrected to 20/20 with glasses).  Respiratory: Negative for cough, chest tightness and shortness of breath.   Cardiovascular: Negative for chest pain, palpitations and leg swelling.  Gastrointestinal: Negative for abdominal pain, diarrhea, constipation and abdominal distention.  Genitourinary: Negative for urgency, frequency, decreased urine volume and difficulty urinating.  Musculoskeletal: Negative for back pain, arthralgias and gait problem.  Skin: Negative for color change, pallor and rash.  Neurological: Negative for dizziness, light-headedness, numbness and headaches.  Hematological: Negative for adenopathy. Does not bruise/bleed easily.  Psychiatric/Behavioral: Negative for suicidal ideas, confusion, sleep disturbance, self-injury, dysphoric mood, decreased concentration and agitation.       Objective:    BP 138/88 (BP Location: Right Arm, Patient Position: Sitting, Cuff Size: Normal)   Pulse 79   Temp 97.8 F (36.6 C) (Temporal)   Resp 18   Ht 5' 5.5" (1.664 m)   Wt 189 lb 12.8 oz (86.1 kg)   SpO2 99%   BMI 31.10 kg/m  General appearance: alert, cooperative, appears stated age and no distress Head: Normocephalic, without obvious abnormality, atraumatic Eyes: negative findings: lids and lashes normal and conjunctivae and sclerae normal Ears: normal TM's and external ear canals both ears Nose: Nares normal. Septum midline. Mucosa normal. No drainage or sinus tenderness. Throat: lips, mucosa, and tongue normal; teeth and gums normal Neck: no adenopathy, no carotid bruit, no JVD, supple, symmetrical, trachea midline and thyroid not enlarged, symmetric, no tenderness/mass/nodules Back: symmetric, no curvature. ROM normal. No CVA tenderness. Lungs: clear to auscultation bilaterally Breasts: gyn Heart: regular rate and rhythm, S1, S2  normal, no murmur, click, rub or gallop Abdomen: soft, non-tender; bowel sounds normal; no masses,  no organomegaly Pelvic: deferred Extremities: extremities normal, atraumatic, no cyanosis or edema Pulses: 2+ and symmetric Skin: Skin color, texture, turgor normal. No rashes or lesions Lymph nodes: Cervical, supraclavicular, and axillary nodes normal. Neurologic: Alert and oriented X 3, normal strength and tone. Normal symmetric reflexes. Normal coordination and gait    Assessment:    Healthy female exam.      Plan:     ghm utd Check labs See After Visit Summary for Counseling Recommendations    1. Essential hypertension .Well controlled, no changes to meds. Encouraged heart healthy diet such as the DASH diet and exercise as tolerated.   - CBC with Differential/Platelet - Lipid panel - TSH - Comprehensive metabolic panel - Vitamin D (25 hydroxy) - hydrochlorothiazide (HYDRODIURIL) 25 MG tablet; Take 0.5 tablets (12.5 mg total) by mouth daily.  Dispense: 90 tablet; Refill: 1  2. Preventative health care ghm utd Check labs  See avs See above  - CBC with Differential/Platelet - Lipid panel - TSH -  Comprehensive metabolic panel - Vitamin D (25 hydroxy)  3. Lower extremity edema - hydrochlorothiazide (HYDRODIURIL) 25 MG tablet; Take 0.5 tablets (12.5 mg total) by mouth daily.  Dispense: 90 tablet; Refill: 1  4. Colon cancer screening   - Ambulatory referral to Gastroenterology  5. Smoking trying to quit   - varenicline (CHANTIX STARTING MONTH PAK) 0.5 MG X 11 & 1 MG X 42 tablet; Take one 0.5 mg tablet by mouth once daily for 3 days, then increase to one 0.5 mg tablet twice daily for 4 days, then increase to one 1 mg tablet twice daily.  Dispense: 53 tablet; Refill: 0

## 2019-02-17 ENCOUNTER — Encounter: Payer: Self-pay | Admitting: Gastroenterology

## 2019-02-21 IMAGING — US US PELVIS COMPLETE TRANSABD/TRANSVAG
1 series · 14 of 25 positions shown · non-contrast
Comparison: None

CLINICAL DATA: Abnormal menstrual cycle



[Series 1: us pelvis complete transabd/transvag · 0.24mm/px · 14 of 41 slices shown]
[im 1/41]
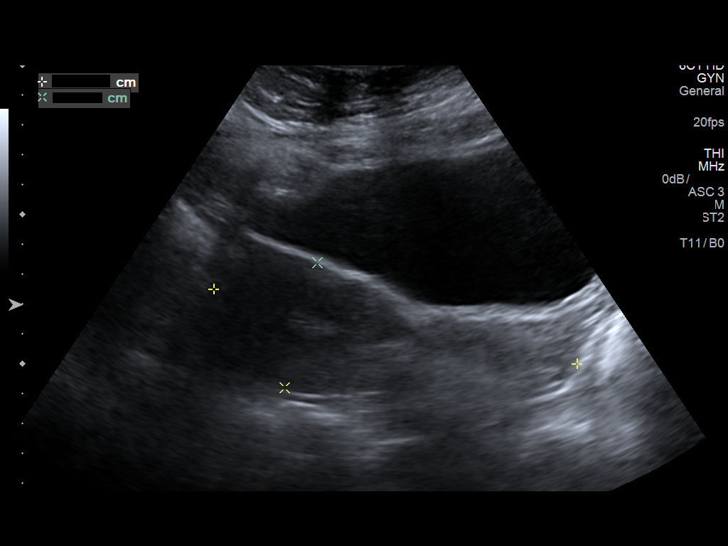
[im 4/41]
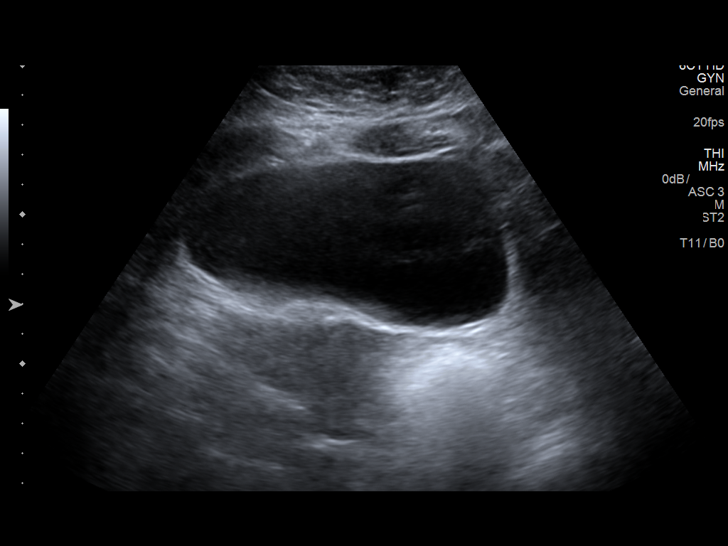
[im 7/41]
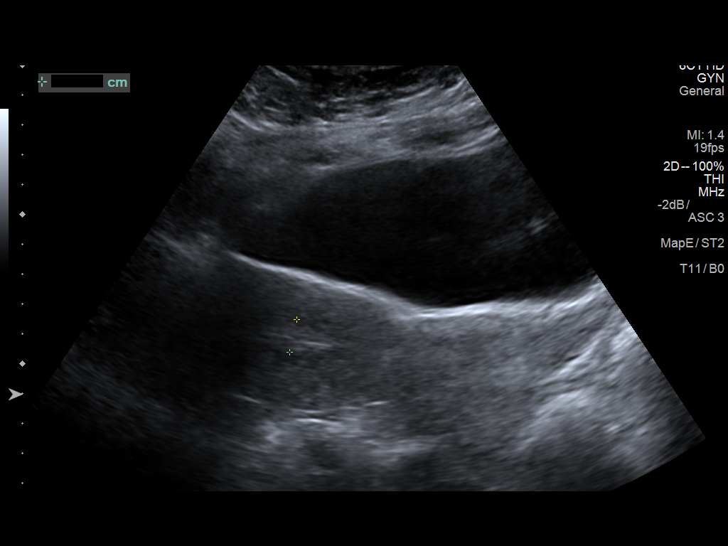
[im 11/41]
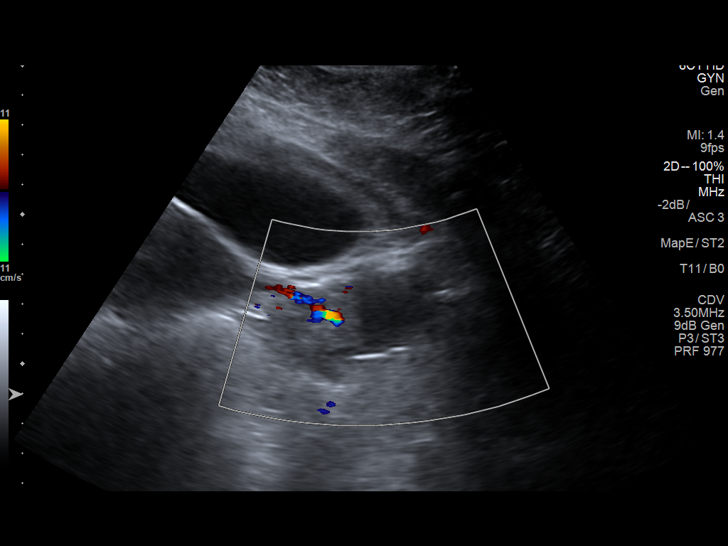
[im 14/41]
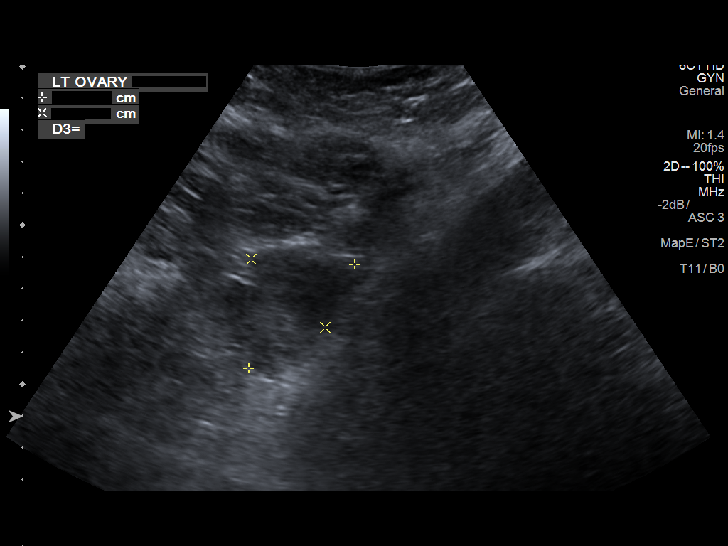
[im 16/41]
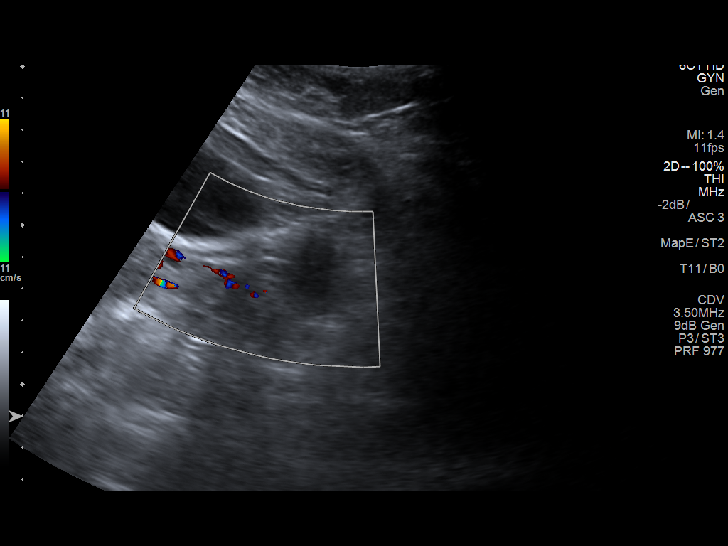
[im 19/41]
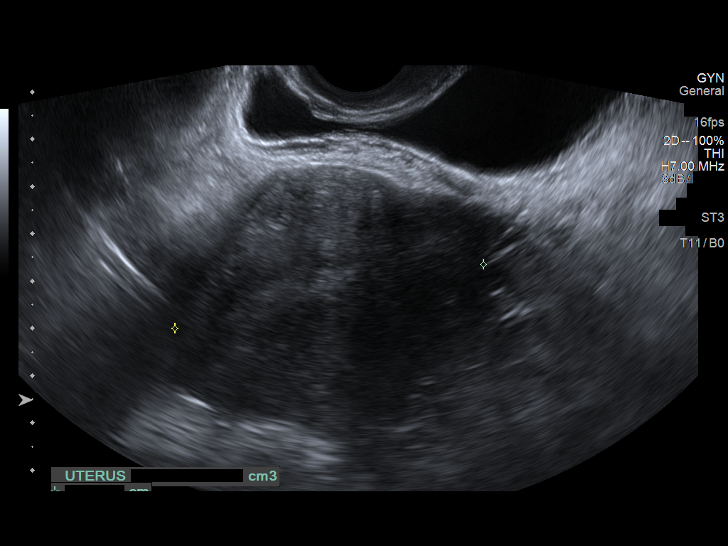
[im 22/41]
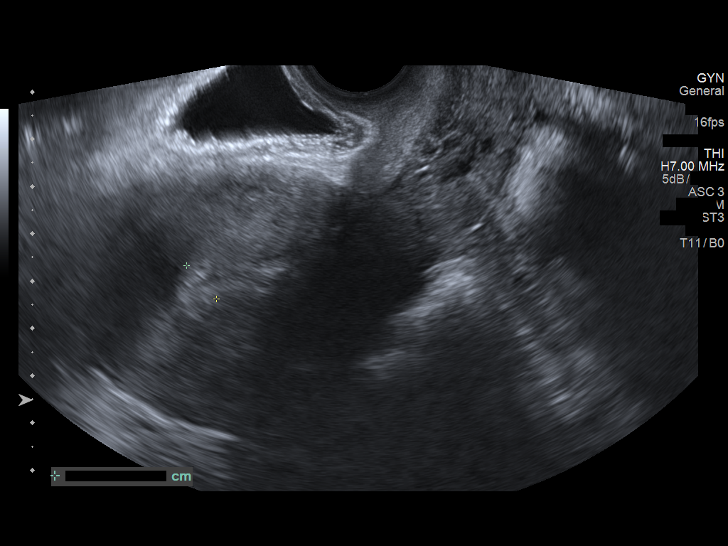
[im 26/41]
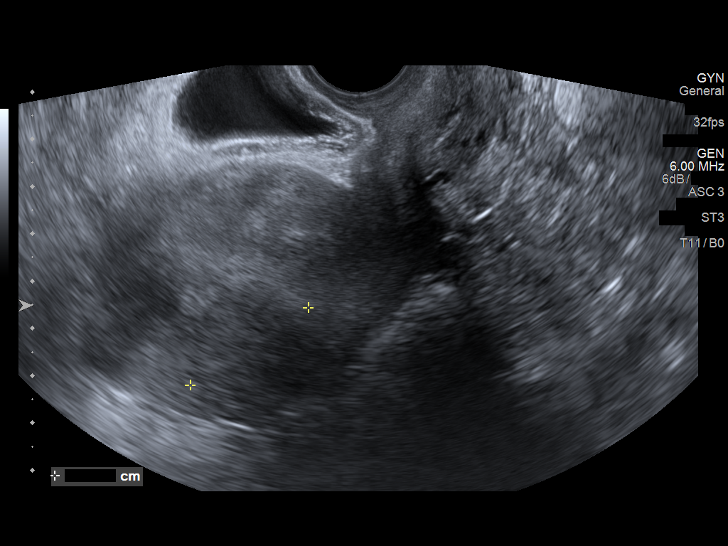
[im 27/41]
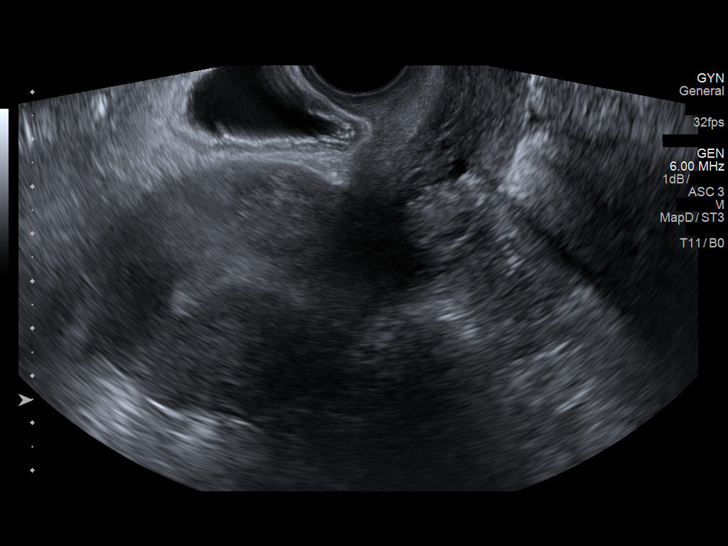
[im 31/41]
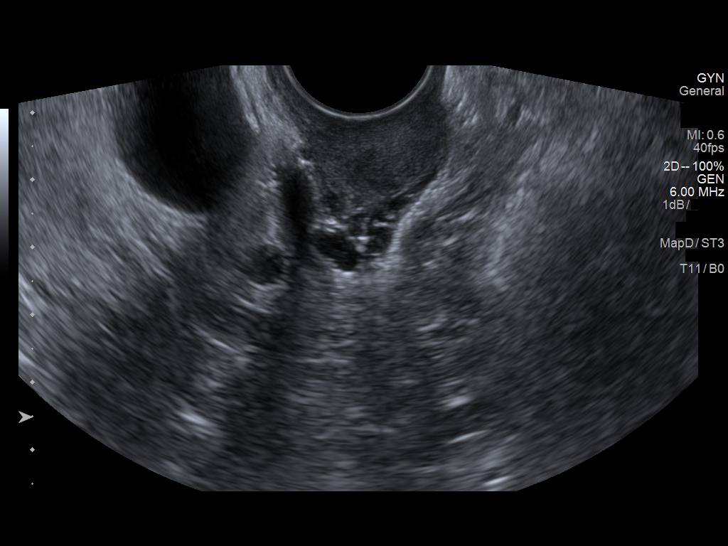
[im 34/41]
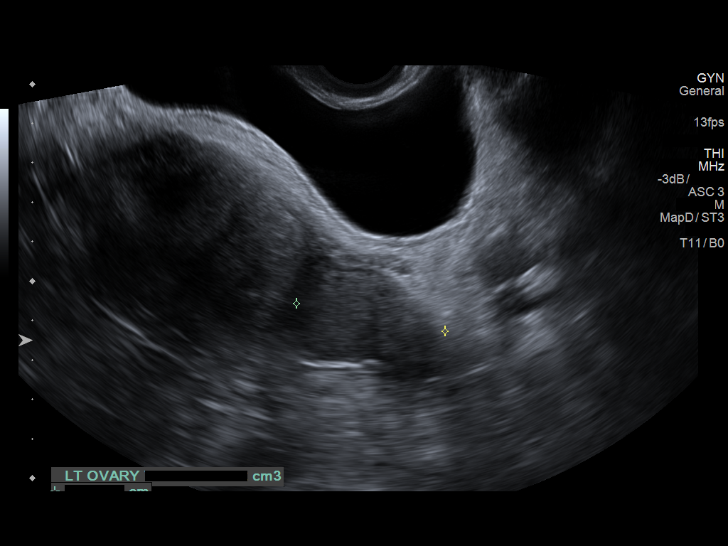
[im 37/41]
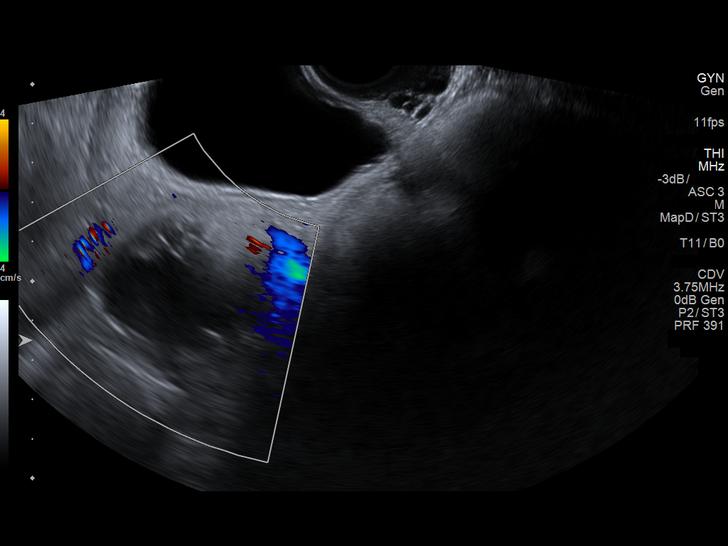
[im 41/41]
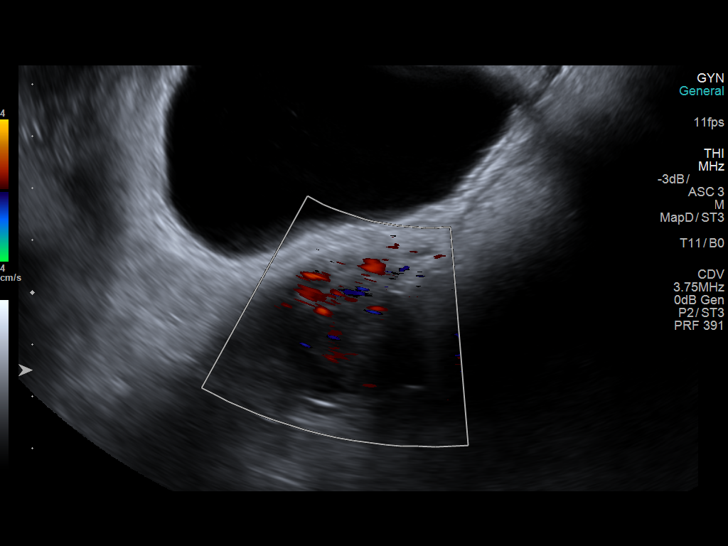

[14 of 25 positions shown; findings below may reference images not displayed]

FINDINGS: Uterus

Measurements: 9.7 by 5.2 by 6.7 cm.. Hypoechoic fibroid in the
posterior wall of the uterus measuring 3 x 2.5 x 3 cm. Heterogeneous
hyper and hypoechoic mass in the anterior wall measuring 2.4 x 1.6 x
2.6 cm

Endometrium

Thickness: 10 mm.  No focal abnormality visualized.

Right ovary

Not seen, history of right oophorectomy

Left ovary

Measurements: 4.7 x 3.2 x 4.1 cm. Cyst in the left ovary measuring
2.9 x 2.7 x 2.1 cm, probably containing a thin septation.

Other findings

No abnormal free fluid.
IMPRESSION: 1. Fibroids within the uterus
2. Surgically absent right ovary

## 2019-03-09 ENCOUNTER — Encounter: Payer: Self-pay | Admitting: Family Medicine

## 2019-03-09 ENCOUNTER — Ambulatory Visit: Payer: BC Managed Care – PPO | Admitting: Family Medicine

## 2019-03-09 ENCOUNTER — Ambulatory Visit: Payer: Self-pay

## 2019-03-09 ENCOUNTER — Other Ambulatory Visit: Payer: Self-pay

## 2019-03-09 VITALS — BP 132/79 | HR 80 | Temp 98.2°F | Resp 12 | Ht 65.5 in | Wt 191.8 lb

## 2019-03-09 DIAGNOSIS — I10 Essential (primary) hypertension: Secondary | ICD-10-CM | POA: Diagnosis not present

## 2019-03-09 DIAGNOSIS — R600 Localized edema: Secondary | ICD-10-CM | POA: Diagnosis not present

## 2019-03-09 MED ORDER — HYDROCHLOROTHIAZIDE 25 MG PO TABS: 12.5000 mg | ORAL_TABLET | Freq: Every day | ORAL | 1 refills | Status: DC

## 2019-03-09 MED ORDER — AMLODIPINE BESYLATE 2.5 MG PO TABS: 2.5000 mg | ORAL_TABLET | Freq: Every day | ORAL | 3 refills | Status: DC

## 2019-03-09 NOTE — Progress Notes (Signed)
Patient ID: Makayla Good, female    DOB: 1970-08-25  Age: 48 y.o. MRN: [id number]    Subjective:  Subjective  HPI Makayla Good presents for high bp 140/90      She has been having headaches as well .   No cp no sob  Review of Systems  Constitutional: Negative for activity change, appetite change, chills, fatigue, fever and unexpected weight change.  HENT: Negative for congestion and hearing loss.   Eyes: Negative for discharge.  Respiratory: Negative for cough and shortness of breath.   Cardiovascular: Negative for chest pain, palpitations and leg swelling.  Gastrointestinal: Negative for abdominal pain, blood in stool, constipation, diarrhea, nausea and vomiting.  Genitourinary: Negative for dysuria, frequency, hematuria and urgency.  Musculoskeletal: Negative for back pain and myalgias.  Skin: Negative for rash.  Allergic/Immunologic: Negative for environmental allergies.  Neurological: Negative for dizziness, weakness and headaches.  Hematological: Does not bruise/bleed easily.  Psychiatric/Behavioral: Negative for behavioral problems, dysphoric mood and suicidal ideas. The patient is not nervous/anxious.     History Past Medical History:  Diagnosis Date   Asthma    Eczema    Fibroid    H/O oophorectomy    Hyperlipidemia    Hypertension    Obesity    Polycystic ovary    Left   Sleep apnea     She has a past surgical history that includes Cesarean section (02/01/2005); Tubal ligation (02/01/2005); Cesarean section (2001); Laparoscopic gastric sleeve resection (N/A, 07/29/2017); Oophorectomy; and Breast biopsy (Right, 2019).   Her family history includes Arthritis in her sister; Coronary artery disease in her father; Dementia in her maternal aunt and mother; Hypertension in her father, mother, sister, and sister; Kidney disease in an other family member; Mental illness in her mother; Schizophrenia in her mother.She reports that she has  been smoking cigarettes. She started smoking about 25 years ago. She has a 8.00 pack-year smoking history. She has never used smokeless tobacco. She reports current alcohol use of about 1.0 standard drinks of alcohol per week. She reports that she does not use drugs.  Current Outpatient Medications on File Prior to Visit  Medication Sig Dispense Refill   Biotin w/ Vitamins C & E (HAIR/SKIN/NAILS PO) Take 1 tablet by mouth daily.     Multiple Vitamins-Minerals (CELEBRATE MULTI-COMPLETE 36 PO) Take 1 each by mouth daily.     No current facility-administered medications on file prior to visit.      Objective:  Objective  Physical Exam Vitals signs and nursing note reviewed.  Constitutional:      Appearance: She is well-developed.  HENT:     Head: Normocephalic and atraumatic.  Eyes:     Conjunctiva/sclera: Conjunctivae normal.  Neck:     Musculoskeletal: Normal range of motion and neck supple.     Thyroid: No thyromegaly.     Vascular: No carotid bruit or JVD.  Cardiovascular:     Rate and Rhythm: Normal rate and regular rhythm.     Heart sounds: Normal heart sounds. No murmur.  Pulmonary:     Effort: Pulmonary effort is normal. No respiratory distress.     Breath sounds: Normal breath sounds. No wheezing or rales.  Chest:     Chest wall: No tenderness.  Neurological:     Mental Status: She is alert and oriented to person, place, and time.    BP 132/79 (BP Location: Left Arm, Cuff Size: Normal)    Pulse 80    Temp 98.2  F (36.8 C) (Temporal)    Resp 12    Ht 5' 5.5" (1.664 m)    Wt 191 lb 12.8 oz (87 kg)    SpO2 100%    BMI 31.43 kg/m  Wt Readings from Last 3 Encounters:  03/09/19 191 lb 12.8 oz (87 kg)  02/13/19 189 lb 12.8 oz (86.1 kg)  01/05/19 189 lb (85.7 kg)     Lab Results  Component Value Date   WBC 5.8 02/13/2019   HGB 13.2 02/13/2019   HCT 39.3 02/13/2019   PLT 183.0 02/13/2019   GLUCOSE 80 02/13/2019   CHOL 185 02/13/2019   TRIG 89.0 02/13/2019   HDL  60.10 02/13/2019   LDLCALC 107 (H) 02/13/2019   ALT 23 02/13/2019   AST 22 02/13/2019   NA 139 02/13/2019   K 4.0 02/13/2019   CL 103 02/13/2019   CREATININE 0.70 02/13/2019   BUN 15 02/13/2019   CO2 29 02/13/2019   TSH 1.71 02/13/2019   HGBA1C 6.5 07/01/2015   MICROALBUR <0.7 01/23/2016    No results found.   Assessment & Plan:  Plan  I have discontinued Makayla Good's Chantix Starting [employer]. I am also having her start on amLODipine. Additionally, I am having her maintain her Multiple Vitamins-Minerals (CELEBRATE MULTI-COMPLETE 36 PO), Biotin w/ Vitamins C & E (HAIR/SKIN/NAILS PO), and hydrochlorothiazide.  Meds ordered this encounter  Medications   amLODipine (NORVASC) 2.5 MG tablet    Sig: Take 1 tablet (2.5 mg total) by mouth daily.    Dispense:  30 tablet    Refill:  3   hydrochlorothiazide (HYDRODIURIL) 25 MG tablet    Sig: Take 0.5 tablets (12.5 mg total) by mouth daily.    Dispense:  90 tablet    Refill:  1    Problem List Items Addressed This Visit      Unprioritized   Essential hypertension - Primary    Poorly controlled will alter medications, encouraged DASH diet, minimize caffeine and obtain adequate sleep. Report concerning symptoms and follow up as directed and as needed      Relevant Medications   amLODipine (NORVASC) 2.5 MG tablet   hydrochlorothiazide (HYDRODIURIL) 25 MG tablet    Other Visit Diagnoses    Lower extremity edema       Relevant Medications   hydrochlorothiazide (HYDRODIURIL) 25 MG tablet      Follow-up: Return in about 2 weeks (around 03/23/2019), or if symptoms worsen or fail to improve, for hypertension.  [name], DO

## 2019-03-09 NOTE — Patient Instructions (Signed)

## 2019-03-09 NOTE — Telephone Encounter (Signed)
Incoming call from Patient with a complaint of blood pressure increasing.  Patient unable to take blood pressure at the time of the call.Due to machine not working correctly.  )Patient provided past values.  144/94, 147/97  134/88.  Pt.  Got the machine to work  Only after standing up.  While assessing B/P  Received the value of 181/117 while standing.  Patient made an appointment for later today.  With Dr.  Carollee Herter.  Pt.  Voiced understanding.           Reason for Disposition . [id number] Systolic BP  >= [id number] OR Diastolic >= 80 AND [id number] taking BP medications  Answer Assessment - Initial Assessment Questions 1. BLOOD PRESSURE: "What is the blood pressure?" "Did you take at least two measurements 5 minutes apart?"     2. ONSET: "When did you take your blood pressure?"    It been happin 3. HOW: "How did you obtain the blood pressure?" (e.g., visiting nurse, automatic home BP monitor)    automatic 4. HISTORY: "Do you have a history of high blood pressure?"     yes 5. MEDICATIONS: "Are you taking any medications for blood pressure?" "Have you missed any doses recently?"     Denies.  Needs to take this morning dose yet.   6. OTHER SYMPTOMS: "Do you have any symptoms?" (e.g., headache, chest pain, blurred vision, difficulty breathing, weakness)    Headache  7. PREGNANCY: "Is there any chance you are pregnant?" "When was your last menstrual period?"   Peri menaupausal.  Protocols used: HIGH BLOOD PRESSURE-A-AH

## 2019-03-09 NOTE — Assessment & Plan Note (Addendum)
Poorly controlled will alter medications, encouraged DASH diet, minimize caffeine and obtain adequate sleep. Report concerning symptoms and follow up as directed and as needed Add norvasc 2.5 daily---- bp running higher at home and causing headaches F/u 2-3 weeks or sooner prn

## 2019-03-09 NOTE — Telephone Encounter (Signed)
FYI  Appointment made for this afternoon.

## 2019-03-23 ENCOUNTER — Encounter: Payer: Self-pay | Admitting: Family Medicine

## 2019-03-23 ENCOUNTER — Ambulatory Visit: Payer: BC Managed Care – PPO | Admitting: Family Medicine

## 2019-03-23 ENCOUNTER — Other Ambulatory Visit: Payer: Self-pay

## 2019-03-23 DIAGNOSIS — I10 Essential (primary) hypertension: Secondary | ICD-10-CM

## 2019-03-23 MED ORDER — AMLODIPINE BESYLATE 2.5 MG PO TABS: 2.5000 mg | ORAL_TABLET | Freq: Every day | ORAL | 1 refills | Status: DC

## 2019-03-23 NOTE — Progress Notes (Signed)
Patient ID: Makayla Good, female    DOB: 24-Oct-1970  Age: 48 y.o. MRN: [id number]    Subjective:  Subjective  HPI Makayla Good presents for bp f/u.  No complaints   Makayla Good feeling much better.   No more headaches   Review of Systems  Constitutional: Negative for appetite change, diaphoresis, fatigue and unexpected weight change.  Eyes: Negative for pain, redness and visual disturbance.  Respiratory: Negative for cough, chest tightness, shortness of breath and wheezing.   Cardiovascular: Negative for chest pain, palpitations and leg swelling.  Endocrine: Negative for cold intolerance, heat intolerance, polydipsia, polyphagia and polyuria.  Genitourinary: Negative for difficulty urinating, dysuria and frequency.  Neurological: Negative for dizziness, light-headedness, numbness and headaches.    History Past Medical History:  Diagnosis Date  . Asthma   . Eczema   . Fibroid   . H/O oophorectomy   . Hyperlipidemia   . Hypertension   . Obesity   . Polycystic ovary    Left  . Sleep apnea     Makayla Good has a past surgical history that includes Cesarean section (02/01/2005); Tubal ligation (02/01/2005); Cesarean section (2001); Laparoscopic gastric sleeve resection (N/A, 07/29/2017); Oophorectomy; and Breast biopsy (Right, 2019).   Makayla Good family history includes Arthritis in Makayla Good sister; Coronary artery disease in Makayla Good father; Dementia in Makayla Good maternal aunt and mother; Hypertension in Makayla Good father, mother, sister, and sister; Kidney disease in an other family member; Mental illness in Makayla Good mother; Schizophrenia in Makayla Good mother.Makayla Good that Makayla Good has been smoking cigarettes. Makayla Good started smoking about 26 years ago. Makayla Good has a 8.00 pack-year smoking history. Makayla Good has never used smokeless tobacco. Makayla Good current alcohol use of about 1.0 standard drinks of alcohol per week. Makayla Good that Makayla Good does not use drugs.  Current Outpatient Medications on File Prior to Visit   Medication Sig Dispense Refill  . Biotin w/ Vitamins C & E (HAIR/SKIN/NAILS PO) Take 1 tablet by mouth daily.    . hydrochlorothiazide (HYDRODIURIL) 25 MG tablet Take 0.5 tablets (12.5 mg total) by mouth daily. 90 tablet 1  . Multiple Vitamins-Minerals (CELEBRATE MULTI-COMPLETE 36 PO) Take 1 each by mouth daily.     No current facility-administered medications on file prior to visit.      Objective:  Objective  Physical Exam Vitals signs and nursing note reviewed.  Constitutional:      Appearance: Makayla Good well-developed.  HENT:     Head: Normocephalic and atraumatic.  Eyes:     Conjunctiva/sclera: Conjunctivae normal.  Neck:     Musculoskeletal: Normal range of motion and neck supple.     Thyroid: No thyromegaly.     Vascular: No carotid bruit or JVD.  Cardiovascular:     Rate and Rhythm: Normal rate and regular rhythm.     Heart sounds: Normal heart sounds. No murmur.  Pulmonary:     Effort: Pulmonary effort Good normal. No respiratory distress.     Breath sounds: Normal breath sounds. No wheezing or rales.  Chest:     Chest wall: No tenderness.  Neurological:     Mental Status: Makayla Good alert and oriented to person, place, and time.    BP 127/75 (BP Location: Left Arm, Cuff Size: Large)   Pulse 85   Temp (!) 97.5 F (36.4 C) (Temporal)   Resp 12   Ht 5' 5.5" (1.664 m)   Wt 191 lb (86.6 kg)   SpO2 100%   BMI 31.30 kg/m  Wt Readings  from Last 3 Encounters:  03/23/19 191 lb (86.6 kg)  03/09/19 191 lb 12.8 oz (87 kg)  02/13/19 189 lb 12.8 oz (86.1 kg)     Lab Results  Component Value Date   WBC 5.8 02/13/2019   HGB 13.2 02/13/2019   HCT 39.3 02/13/2019   PLT 183.0 02/13/2019   GLUCOSE 80 02/13/2019   CHOL 185 02/13/2019   TRIG 89.0 02/13/2019   HDL 60.10 02/13/2019   LDLCALC 107 (H) 02/13/2019   ALT 23 02/13/2019   AST 22 02/13/2019   NA 139 02/13/2019   K 4.0 02/13/2019   CL 103 02/13/2019   CREATININE 0.70 02/13/2019   BUN 15 02/13/2019   CO2 29  02/13/2019   TSH 1.71 02/13/2019   HGBA1C 6.5 07/01/2015   MICROALBUR <0.7 01/23/2016    No results found.   Assessment & Plan:  Plan  I am having Makayla Good maintain Makayla Good Multiple Vitamins-Minerals (CELEBRATE MULTI-COMPLETE 36 PO), Biotin w/ Vitamins C & E (HAIR/SKIN/NAILS PO), hydrochlorothiazide, and amLODipine.  Meds ordered this encounter  Medications  . amLODipine (NORVASC) 2.5 MG tablet    Sig: Take 1 tablet (2.5 mg total) by mouth daily.    Dispense:  90 tablet    Refill:  1    Problem List Items Addressed This Visit      Unprioritized   Essential hypertension   Relevant Medications   amLODipine (NORVASC) 2.5 MG tablet      con't hctz and f/u 6 months or sooner prn   Follow-up: Return in about 6 months (around 09/20/2019), or if symptoms worsen or fail to improve, for hypertension.  [name], DO

## 2019-03-23 NOTE — Patient Instructions (Signed)

## 2019-03-30 ENCOUNTER — Ambulatory Visit: Payer: BC Managed Care – PPO | Admitting: Gastroenterology

## 2019-03-30 ENCOUNTER — Encounter: Payer: Self-pay | Admitting: Gastroenterology

## 2019-03-30 VITALS — BP 124/84 | HR 84 | Ht 65.0 in | Wt 192.0 lb

## 2019-03-30 DIAGNOSIS — Z1211 Encounter for screening for malignant neoplasm of colon: Secondary | ICD-10-CM | POA: Diagnosis not present

## 2019-03-30 DIAGNOSIS — R194 Change in bowel habit: Secondary | ICD-10-CM

## 2019-03-30 DIAGNOSIS — Z1159 Encounter for screening for other viral diseases: Secondary | ICD-10-CM

## 2019-03-30 DIAGNOSIS — Z716 Tobacco abuse counseling: Secondary | ICD-10-CM | POA: Diagnosis not present

## 2019-03-30 NOTE — Progress Notes (Signed)
Makayla Good    JI:[id number]    [birth year]  Primary Care Physician:Lowne Cheri Rous Alferd Apa, DO  Referring Physician: Carollee Herter, Alferd Apa, DO [address] [place],  [place] [address]   Chief complaint: Colon cancer screening  HPI: 22 yr F with h/o hypertension, OSA, obesity s/p gastric sleeve surgery here to discuss colorectal cancer screening She started noticing change in bowel habits after gastric sleeve surgery.  She is taking MiraLAX 1 capful daily with improvement in bowel habits.  She has a bowel movement once daily on average. Denies any nausea, vomiting, abdominal pain, melena or bright red blood per rectum  No family history of GI malignancy.  She is active smoker.  She had quit prior to gastric sleeve surgery with nicotine patch but has since gone back to smoking  Outpatient Encounter Medications as of 03/30/2019  Medication Sig  . amLODipine (NORVASC) 2.5 MG tablet Take 1 tablet (2.5 mg total) by mouth daily.  . Biotin w/ Vitamins C & E (HAIR/SKIN/NAILS PO) Take 1 tablet by mouth daily.  . hydrochlorothiazide (HYDRODIURIL) 25 MG tablet Take 0.5 tablets (12.5 mg total) by mouth daily.  . Multiple Vitamins-Minerals (CELEBRATE MULTI-COMPLETE 36 PO) Take 1 each by mouth daily.   No facility-administered encounter medications on file as of 03/30/2019.     Allergies as of 03/30/2019  . (No Known Allergies)    Past Medical History:  Diagnosis Date  . Asthma   . Eczema   . Fibroid   . H/O oophorectomy   . Hyperlipidemia   . Hypertension   . Obesity   . Polycystic ovary    Left  . Sleep apnea     Past Surgical History:  Procedure Laterality Date  . BREAST BIOPSY Right 2019   fibroadenoma  . CESAREAN SECTION  02/01/2005  . CESAREAN SECTION  2001  . LAPAROSCOPIC GASTRIC SLEEVE RESECTION N/A 07/29/2017   Procedure: LAPAROSCOPIC GASTRIC SLEEVE RESECTION WITH UPPER ENDO ;  Surgeon: [name], MD;  Location: [facility];   Service: General;  Laterality: N/A;  . OOPHORECTOMY     Rt.removed  . TUBAL LIGATION  02/01/2005    Family History  Problem Relation Age of Onset  . Hypertension Mother   . Schizophrenia Mother   . Dementia Mother   . Mental illness Mother        schizophrenia, dementia  . Hypertension Father   . Coronary artery disease Father        Stent  . Hypertension Sister   . Hypertension Sister   . Arthritis Sister        rheumatoid  . Dementia Maternal Aunt   . Kidney disease Other   . Colon cancer Neg Hx   . Stomach cancer Neg Hx   . Pancreatic cancer Neg Hx   . Esophageal cancer Neg Hx     Social History   Socioeconomic History  . Marital status: Married    Spouse name: Not on file  . Number of children: Not on file  . Years of education: 56  . Highest education level: Not on file  Occupational History  . Occupation: release Horticulturist, commercial: [employer]  Social Needs  . Financial resource strain: Not on file  . Food insecurity    Worry: Sometimes true    Inability: Never true  . Transportation needs    Medical: Not on file  Non-medical: Not on file  Tobacco Use  . Smoking status: Current Every Day Smoker    Packs/day: 0.50    Years: 16.00    Pack years: 8.00    Types: Cigarettes    Start date: 03/23/1993  . Smokeless tobacco: Never Used  Substance and Sexual Activity  . Alcohol use: Yes    Alcohol/week: 1.0 standard drinks    Types: 1 Glasses of wine per week    Comment: occasional  . Drug use: No  . Sexual activity: Yes    Partners: Male    Birth control/protection: Surgical    Comment: Tubal/been with same person over 20 years  Lifestyle  . Physical activity    Days per week: Not on file    Minutes per session: Not on file  . Stress: Not on file  Relationships  . Social Herbalist on phone: Not on file    Gets together: Not on file    Attends religious service: Not on file    Active member of club or organization: Not on file     Attends meetings of clubs or organizations: Not on file    Relationship status: Not on file  . Intimate partner violence    Fear of current or ex partner: Not on file    Emotionally abused: Not on file    Physically abused: Not on file    Forced sexual activity: Not on file  Other Topics Concern  . Not on file  Social History Narrative   Exercise--gym, every other day--- at least 3 days a week      Review of systems: Review of Systems  Constitutional: Negative for fever and chills.  HENT: Negative.   Eyes: Negative for blurred vision.  Respiratory: Negative for cough, shortness of breath and wheezing.   Cardiovascular: Negative for chest pain and palpitations.  Gastrointestinal: as per HPI Genitourinary: Negative for dysuria, urgency, frequency and hematuria.  Musculoskeletal: Negative for myalgias, back pain and joint pain.  Skin: Negative for itching and rash.  Neurological: Negative for dizziness, tremors, focal weakness, seizures and loss of consciousness.  Endo/Heme/Allergies: Positive for seasonal allergies.  Psychiatric/Behavioral: Negative for depression, suicidal ideas and hallucinations.  Positive for insomnia All other systems reviewed and are negative.   Physical Exam: Vitals:   03/30/19 1412  BP: 124/84  Pulse: 84   Body mass index is 31.95 kg/m. Gen:      No acute distress HEENT:  EOMI, sclera anicteric Neck:     No masses; no thyromegaly Lungs:    Clear to auscultation bilaterally; normal respiratory effort CV:         Regular rate and rhythm; no murmurs Abd:      + bowel sounds; soft, non-tender; no palpable masses, no distension Ext:    No edema; adequate peripheral perfusion Skin:      Warm and dry; no rash Neuro: alert and oriented x 3 Psych: normal mood and affect  Data Reviewed:  Reviewed labs, radiology imaging, old records and pertinent past GI work up   Assessment and Plan/Recommendations:  48 year old female, African-American with  obesity s/p gastric sleeve surgery, hypertension here to discuss colorectal cancer screening Schedule for colonoscopy The risks and benefits as well as alternatives of endoscopic procedure(s) have been discussed and reviewed. All questions answered. The patient agrees to proceed. Extended bowel prep with 2-day MiraLAX prep  Current active smoker: Discussed smoking cessation The patient was counseled on the dangers of tobacco use, and  was advised to quit.  Reviewed strategies to maximize success, including removing cigarettes and smoking materials from environment, substitution of other forms of reinforcement and support of family/friends. She was given Prescription for Chantix by Dr Etter Sjogren.     Damaris Hippo , MD    CC: Carollee Herter, Alferd Apa, *

## 2019-03-30 NOTE — Patient Instructions (Signed)
If you are age 48 or older, your body mass index should be between 23-30. Your Body mass index is 31.95 kg/m. If this is out of the aforementioned range listed, please consider follow up with your Primary Care Provider.  If you are age 86 or younger, your body mass index should be between 19-25. Your Body mass index is 31.95 kg/m. If this is out of the aformentioned range listed, please consider follow up with your Primary Care Provider.    You have been scheduled for a colonoscopy. Please follow written instructions given to you at your visit today.  Please pick up your prep supplies at the pharmacy within the next 1-3 days. If you use inhalers (even only as needed), please bring them with you on the day of your procedure.  All of your prep supplies will be purchased over  the counter   I appreciate the  opportunity to care for you  Thank You   Harl Bowie , MD

## 2019-04-01 ENCOUNTER — Encounter: Payer: BC Managed Care – PPO | Attending: Surgery | Admitting: Dietician

## 2019-04-01 ENCOUNTER — Other Ambulatory Visit: Payer: Self-pay

## 2019-04-01 ENCOUNTER — Encounter: Payer: Self-pay | Admitting: Dietician

## 2019-04-01 DIAGNOSIS — Z9884 Bariatric surgery status: Secondary | ICD-10-CM | POA: Diagnosis not present

## 2019-04-01 NOTE — Patient Instructions (Addendum)
Great job on eating lots of protein foods and drinking lots of fluids! Continue taking your bariatric supplements daily and getting in as much physical activity as possible.  Remember your goal to focus on:   Aim to increase non-starchy vegetable intake.

## 2019-04-01 NOTE — Progress Notes (Signed)
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, able to walk without pain, improve feet issues"   Non scale victories: walking faster on treadmill, improve knee pain, able to move better, wearing smaller sizes, decreased pants by 3 sizes, improved feet issues   1 Year + 8 Months Post-Op Sleeve Gastrectomy  Surgery date: 07/29/2017   NUTRITION ASSESSMENT  Start weight at [facility]: 273.4 lbs Weight today: 188.3 lbs  Body Composition  Scale 09/30/2018 04/01/2019  Weight  lbs  192.3 188.3  BMI  32 31.3  Total Body Fat % 37.4 36.9     Visceral Fat 10 10  Fat-Free Mass % 62.5 63     Total Body Water % 45.7 46     Muscle-Mass lbs 31.8 34.6  Body Fat Displacement           Torso  lbs 44.5 43         Left Leg  lbs 8.9 8.6         Right Leg  lbs 8.9 8.6         Left Arm  lbs 4.4 4.3         Right Arm   lbs 4.4 4.3   24-Hr Dietary Recall B: 2 eggs + cheese Snk: peanut butter crackers  L: wheat thins sandwich bread + [place]  Snk: yogurt  D: chicken salad Snk: sugar free pudding Bev: water, decaf coffee  Pt states she eats a lot of fish, shrimp, [place], chicken, peppers & onions, yogurt, and peanut butter crackers. Rarely eats beef since it upsets her stomach. States she knows she needs to eat more vegetables.    Estimated daily fluid intake: 50+ ounces  Estimated daily protein intake: 60+ grams  Medications: See list Supplementation: ProCare, calcium, and hair/skin/nail vitamin   Recent physical activity: walking   Using straws: no Drinking while eating: no Chewing/swallowing difficulties: no Changes in vision: no Changes to mood/headaches: no Hair loss/Changes to skin/nails: no Any difficulty focusing or concentrating: no Sweating:  no Dizziness/Lightheadedness: no Palpitations: no  Carbonated beverages: no N/V/D/C/GAS: no Abdominal Pain: no Dumping syndrome: no    NUTRITION DIAGNOSIS Reserve-3.3: Overweight/obesity related to past poor dietary habits and physical inactivity as evidenced by patient w/ recent sleeve gastrectomy surgery following dietary guidelines for continued weight loss.     NUTRITION INTERVENTION Nutrition education and counseling to promote continued progress post bariatric surgery.   Goals: - Increase intake of non starchy vegetables  Teaching Method Utilized: Visual, Auditory, Hands on Barriers to learning/adherence to lifestyle change: None Identified  Demonstrated degree of understanding via:  Teach Back    MONITORING / EVALUATION Dietary intake, exercise, body weight, and goals at next nutrition visit.   Patient is to return to [facility] for nutrition follow up visit in 6 months.

## 2019-04-06 ENCOUNTER — Ambulatory Visit (INDEPENDENT_AMBULATORY_CARE_PROVIDER_SITE_OTHER): Payer: BC Managed Care – PPO

## 2019-04-06 ENCOUNTER — Other Ambulatory Visit: Payer: Self-pay | Admitting: Gastroenterology

## 2019-04-06 DIAGNOSIS — Z1159 Encounter for screening for other viral diseases: Secondary | ICD-10-CM | POA: Diagnosis not present

## 2019-04-07 LAB — SARS CORONAVIRUS 2 (TAT 6-24 HRS): SARS Coronavirus 2: NEGATIVE

## 2019-04-08 ENCOUNTER — Ambulatory Visit (AMBULATORY_SURGERY_CENTER): Payer: BC Managed Care – PPO | Admitting: Gastroenterology

## 2019-04-08 ENCOUNTER — Encounter: Payer: Self-pay | Admitting: Gastroenterology

## 2019-04-08 ENCOUNTER — Other Ambulatory Visit: Payer: Self-pay

## 2019-04-08 VITALS — BP 124/83 | HR 72 | Temp 98.6°F | Resp 11 | Ht 65.0 in | Wt 192.0 lb

## 2019-04-08 DIAGNOSIS — Z1211 Encounter for screening for malignant neoplasm of colon: Secondary | ICD-10-CM

## 2019-04-08 DIAGNOSIS — K621 Rectal polyp: Secondary | ICD-10-CM | POA: Diagnosis not present

## 2019-04-08 DIAGNOSIS — D128 Benign neoplasm of rectum: Secondary | ICD-10-CM

## 2019-04-08 MED ORDER — SODIUM CHLORIDE 0.9 % IV SOLN: 500.0000 mL | Freq: Once | INTRAVENOUS | Status: DC

## 2019-04-08 NOTE — Op Note (Signed)
[facility] Patient Name: Makayla Good Procedure Date: 04/08/2019 1:26 PM MRN: [id number] Endoscopist: Mauri Pole , MD Age: 48 Referring MD:  Date of Birth: 02-Nov-1970 Gender: Female Account #: [id number] Procedure:                Colonoscopy Indications:              Screening for colorectal malignant neoplasm Medicines:                Monitored Anesthesia Care Procedure:                Pre-Anesthesia Assessment:                           - Prior to the procedure, a History and Physical                            was performed, and patient medications and                            allergies were reviewed. The patient's tolerance of                            previous anesthesia was also reviewed. The risks                            and benefits of the procedure and the sedation                            options and risks were discussed with the patient.                            All questions were answered, and informed consent                            was obtained. Prior Anticoagulants: The patient has                            taken no previous anticoagulant or antiplatelet                            agents. ASA Grade Assessment: II - A patient with                            mild systemic disease. After reviewing the risks                            and benefits, the patient was deemed in                            satisfactory condition to undergo the procedure.                           After obtaining informed consent, the colonoscope  was passed under direct vision. Throughout the                            procedure, the patient's blood pressure, pulse, and                            oxygen saturations were monitored continuously. The                            Colonoscope was introduced through the anus and                            advanced to the the cecum, identified by                            appendiceal  orifice and ileocecal valve. The                            colonoscopy was performed without difficulty. The                            patient tolerated the procedure well. The quality                            of the bowel preparation was good. The ileocecal                            valve, appendiceal orifice, and rectum were                            photographed. Scope In: 1:29:21 PM Scope Out: 1:46:06 PM Scope Withdrawal Time: 0 hours 13 minutes 53 seconds  Total Procedure Duration: 0 hours 16 minutes 45 seconds  Findings:                 The perianal and digital rectal examinations were                            normal.                           A less than 1 mm polyp was found in the rectum. The                            polyp was sessile. The polyp was removed with a                            cold biopsy forceps. Resection and retrieval were                            complete.                           Scattered small and large-mouthed diverticula were  found in the sigmoid colon, transverse colon,                            ascending colon and cecum.                           Non-bleeding internal hemorrhoids were found during                            retroflexion. The hemorrhoids were small.                           The exam was otherwise without abnormality. Complications:            No immediate complications. Estimated Blood Loss:     Estimated blood loss was minimal. Impression:               - One less than 1 mm polyp in the rectum, removed                            with a cold biopsy forceps. Resected and retrieved.                           - Mild diverticulosis in the sigmoid colon, in the                            transverse colon, in the ascending colon and in the                            cecum.                           - Non-bleeding internal hemorrhoids.                           - The examination was otherwise  normal. Recommendation:           - Patient has a contact number available for                            emergencies. The signs and symptoms of potential                            delayed complications were discussed with the                            patient. Return to normal activities tomorrow.                            Written discharge instructions were provided to the                            patient.                           - Resume previous diet.                           -  Continue present medications.                           - Await pathology results.                           - Repeat colonoscopy in 5-10 years for surveillance                            based on pathology results. Mauri Pole, MD 04/08/2019 1:50:52 PM This report has been signed electronically.

## 2019-04-08 NOTE — Progress Notes (Signed)
Report to PACU, RN, vss, BBS= Clear.  

## 2019-04-08 NOTE — Progress Notes (Signed)
[id number] [id number]

## 2019-04-08 NOTE — Patient Instructions (Signed)
Please read handouts provided. Continue present medications. Await pathology results.        YOU HAD AN ENDOSCOPIC PROCEDURE TODAY AT THE [facility]:   Refer to the procedure report that was given to you for any specific questions about what was found during the examination.  If the procedure report does not answer your questions, please call your gastroenterologist to clarify.  If you requested that your care partner not be given the details of your procedure findings, then the procedure report has been included in a sealed envelope for you to review at your convenience later.  YOU SHOULD EXPECT: Some feelings of bloating in the abdomen. Passage of more gas than usual.  Walking can help get rid of the air that was put into your GI tract during the procedure and reduce the bloating. If you had a lower endoscopy (such as a colonoscopy or flexible sigmoidoscopy) you may notice spotting of blood in your stool or on the toilet paper. If you underwent a bowel prep for your procedure, you may not have a normal bowel movement for a few days.  Please Note:  You might notice some irritation and congestion in your nose or some drainage.  This is from the oxygen used during your procedure.  There is no need for concern and it should clear up in a day or so.  SYMPTOMS TO REPORT IMMEDIATELY:   Following lower endoscopy (colonoscopy or flexible sigmoidoscopy):  Excessive amounts of blood in the stool  Significant tenderness or worsening of abdominal pains  Swelling of the abdomen that is new, acute  Fever of 100F or higher    For urgent or emergent issues, a gastroenterologist can be reached at any hour by calling [phone].   DIET:  We do recommend a small meal at first, but then you may proceed to your regular diet.  Drink plenty of fluids but you should avoid alcoholic beverages for 24 hours.  ACTIVITY:  You should plan to take it easy for the rest of today and you should NOT  DRIVE or use heavy machinery until tomorrow (because of the sedation medicines used during the test).    FOLLOW UP: Our staff will call the number listed on your records 48-72 hours following your procedure to check on you and address any questions or concerns that you may have regarding the information given to you following your procedure. If we do not reach you, we will leave a message.  We will attempt to reach you two times.  During this call, we will ask if you have developed any symptoms of COVID 19. If you develop any symptoms (ie: fever, flu-like symptoms, shortness of breath, cough etc.) before then, please call [phone].  If you test positive for Covid 19 in the 2 weeks post procedure, please call and report this information to us.    If any biopsies were taken you will be contacted by phone or by letter within the next 1-3 weeks.  Please call us at [phone] if you have not heard about the biopsies in 3 weeks.    SIGNATURES/CONFIDENTIALITY: You and/or your care partner have signed paperwork which will be entered into your electronic medical record.  These signatures attest to the fact that that the information above on your After Visit Summary has been reviewed and is understood.  Full responsibility of the confidentiality of this discharge information lies with you and/or your care-partner. 

## 2019-04-08 NOTE — Progress Notes (Signed)
Called to room to assist during endoscopic procedure.  Patient ID and intended procedure confirmed with present staff. Received instructions for my participation in the procedure from the performing physician.  

## 2019-04-10 ENCOUNTER — Telehealth: Payer: Self-pay

## 2019-04-10 NOTE — Telephone Encounter (Signed)
  Follow up Call-  Call back number 04/08/2019  Post procedure Call Back phone  # [phone]  Permission to leave phone message Yes  Some recent data might be hidden     Patient questions:  Do you have a fever, pain , or abdominal swelling? No. Pain Score  0 *  Have you tolerated food without any problems? Yes.    Have you been able to return to your normal activities? Yes.    Do you have any questions about your discharge instructions: Diet   No. Medications  No. Follow up visit  No.  Do you have questions or concerns about your Care? No.  Actions: * If pain score is 4 or above: No action needed, pain <4.  1. Have you developed a fever since your procedure? no  2.   Have you had an respiratory symptoms (SOB or cough) since your procedure? no  3.   Have you tested positive for COVID 19 since your procedure no  4.   Have you had any family members/close contacts diagnosed with the COVID 19 since your procedure?  no   If yes to any of these questions please route to Joylene John, RN and Alphonsa Gin, Therapist, sports.

## 2019-04-15 ENCOUNTER — Encounter: Payer: Self-pay | Admitting: Gastroenterology

## 2019-08-18 ENCOUNTER — Other Ambulatory Visit: Payer: Self-pay

## 2019-08-18 NOTE — Progress Notes (Signed)
49 y.o. [id number] Married [place] American female here for annual exam.    Having hot flashes and doing ok with them. They occur mostly at night.  Melatonin is helping.  Her cycle has been about every 3 - 4 months.  [place] 15.3 05/22/18.   Vaccinated against Covid.  Working for [employer].  PCP:  [name], DO   Patient's last menstrual period was 04/22/2019 (approximate).     Period Pattern: (!) Irregular(?perimenopausal)     Sexually active: Yes.    The current method of family planning is tubal ligation.    Exercising: Yes.    Gym/ health club routine includes cardio and light weights. Smoker:  Yes, 1/2 ppd  Health Maintenance: Pap: 05-22-18 Neg:Neg HR HPV, 01-23-16 Neg:Neg HR HPV, 10-21-12 Neg:Neg HR HPV History of abnormal Pap:  no MMG: 12-04-18 3D/neg/density C/BiRads1 Colonoscopy: 04-08-19 1 benign polyp;next 10 yrs. BMD:   n/a  Result  n/a TDaP: PCP Gardasil:   no HIV: 06-13-17 Neg Hep C: Unsure Screening Labs:   PCP.    reports that she has been smoking cigarettes. She started smoking about 26 years ago. She has a 8.00 pack-year smoking history. She has never used smokeless tobacco. She reports current alcohol use of about 1.0 standard drinks of alcohol per week. She reports that she does not use drugs.  Past Medical History:  Diagnosis Date  . Asthma   . Eczema   . Fibroid   . H/O oophorectomy   . Hypertension   . Obesity   . Polycystic ovary    Left  . Sleep apnea    no CPAP per MD    Past Surgical History:  Procedure Laterality Date  . BACK SURGERY     48yrs ago  . BREAST BIOPSY Right 2019   fibroadenoma  . CESAREAN SECTION  02/01/2005  . CESAREAN SECTION  2001  . LAPAROSCOPIC GASTRIC SLEEVE RESECTION N/A 07/29/2017   Procedure: LAPAROSCOPIC GASTRIC SLEEVE RESECTION WITH UPPER ENDO ;  Surgeon: [name], MD;  Location: [facility];  Service: General;  Laterality: N/A;  . OOPHORECTOMY     Rt.removed  . TUBAL LIGATION  02/01/2005    Current  Outpatient Medications  Medication Sig Dispense Refill  . amLODipine (NORVASC) 2.5 MG tablet Take 1 tablet (2.5 mg total) by mouth daily. 90 tablet 1  . Biotin w/ Vitamins C & E (HAIR/SKIN/NAILS PO) Take 1 tablet by mouth daily.    . hydrochlorothiazide (HYDRODIURIL) 25 MG tablet Take 0.5 tablets (12.5 mg total) by mouth daily. 90 tablet 1  . Multiple Vitamins-Minerals (CELEBRATE MULTI-COMPLETE 36 PO) Take 1 each by mouth daily.     No current facility-administered medications for this visit.    Family History  Problem Relation Age of Onset  . Hypertension Mother   . Schizophrenia Mother   . Dementia Mother   . Mental illness Mother        schizophrenia, dementia  . Hypertension Father   . Coronary artery disease Father        Stent  . Hypertension Sister   . Hypertension Sister   . Arthritis Sister        rheumatoid  . Dementia Maternal Aunt   . Kidney disease Other   . Colon cancer Neg Hx   . Stomach cancer Neg Hx   . Pancreatic cancer Neg Hx   . Esophageal cancer Neg Hx   . Rectal cancer Neg Hx     Review of Systems  All other systems reviewed and are negative.   Exam:   BP 132/84 (Cuff Size: Large)   Pulse 80   Temp 97.6 F (36.4 C) (Temporal)   Resp 20   Ht 5' 5.5" (1.664 m)   Wt 191 lb 6.4 oz (86.8 kg)   LMP 04/22/2019 (Approximate)   BMI 31.37 kg/m     General appearance: alert, cooperative and appears stated age Head: normocephalic, without obvious abnormality, atraumatic Neck: no adenopathy, supple, symmetrical, trachea midline and thyroid normal to inspection and palpation Lungs: clear to auscultation bilaterally Breasts: normal appearance, no masses or tenderness, No nipple retraction or dimpling, No nipple discharge or bleeding, No axillary adenopathy Heart: regular rate and rhythm Abdomen: soft, non-tender; no masses, no organomegaly Extremities: extremities normal, atraumatic, no cyanosis or edema Skin: skin color, texture, turgor normal. No  rashes or lesions Lymph nodes: cervical, supraclavicular, and axillary nodes normal. Neurologic: grossly normal  Pelvic: External genitalia:  no lesions              No abnormal inguinal nodes palpated.              Urethra:  normal appearing urethra with no masses, tenderness or lesions              Bartholins and Skenes: normal                 Vagina: normal appearing vagina with normal color and discharge, no lesions              Cervix: no lesions              Pap taken: No. Bimanual Exam:  Uterus:  normal size, contour, position, consistency, mobility, non-tender              Adnexa: no mass, fullness, tenderness              Rectal exam: Yes.  .  Confirms.              Anus:  normal sphincter tone, no lesions  Chaperone was present for exam.  Assessment:   Well woman visit with normal exam. Perimenopausal female.  Status post right oophorectomy.  Status post gastric sleeve surgery.  Status post BTL.  Smoker.   Plan: Mammogram screening discussed. Self breast awareness reviewed. Pap and HR HPV as above. Guidelines for Calcium, Vitamin D, regular exercise program including cardiovascular and weight bearing exercise. She will try nicotine gum to help with smoking cessation.  She will monitor her menstrual cycles.  Follow up annually and prn.   After visit summary provided.

## 2019-08-19 ENCOUNTER — Ambulatory Visit: Payer: BC Managed Care – PPO | Admitting: Obstetrics and Gynecology

## 2019-08-19 ENCOUNTER — Encounter: Payer: Self-pay | Admitting: Obstetrics and Gynecology

## 2019-08-19 VITALS — BP 132/84 | HR 80 | Temp 97.6°F | Resp 20 | Ht 65.5 in | Wt 191.4 lb

## 2019-08-19 DIAGNOSIS — Z01419 Encounter for gynecological examination (general) (routine) without abnormal findings: Secondary | ICD-10-CM | POA: Diagnosis not present

## 2019-08-19 NOTE — Patient Instructions (Signed)

## 2019-08-28 DIAGNOSIS — K912 Postsurgical malabsorption, not elsewhere classified: Secondary | ICD-10-CM | POA: Diagnosis not present

## 2019-08-28 DIAGNOSIS — Z9884 Bariatric surgery status: Secondary | ICD-10-CM | POA: Diagnosis not present

## 2019-09-22 ENCOUNTER — Encounter: Payer: Self-pay | Admitting: Family Medicine

## 2019-09-22 ENCOUNTER — Other Ambulatory Visit: Payer: Self-pay

## 2019-09-22 ENCOUNTER — Ambulatory Visit: Payer: BC Managed Care – PPO | Admitting: Family Medicine

## 2019-09-22 VITALS — BP 124/78 | HR 106 | Temp 97.7°F | Resp 18 | Ht 65.5 in | Wt 187.2 lb

## 2019-09-22 DIAGNOSIS — R600 Localized edema: Secondary | ICD-10-CM | POA: Diagnosis not present

## 2019-09-22 DIAGNOSIS — I10 Essential (primary) hypertension: Secondary | ICD-10-CM | POA: Diagnosis not present

## 2019-09-22 DIAGNOSIS — I890 Lymphedema, not elsewhere classified: Secondary | ICD-10-CM | POA: Diagnosis not present

## 2019-09-22 MED ORDER — AMLODIPINE BESYLATE 2.5 MG PO TABS: ORAL_TABLET | ORAL | 1 refills | Status: DC

## 2019-09-22 MED ORDER — HYDROCHLOROTHIAZIDE 25 MG PO TABS: ORAL_TABLET | ORAL | 1 refills | Status: DC

## 2019-09-22 NOTE — Assessment & Plan Note (Signed)
Can inc hctz to 25 mg but really think it is lymphedema

## 2019-09-22 NOTE — Assessment & Plan Note (Signed)
Well controlled, no changes to meds. Encouraged heart healthy diet such as the DASH diet and exercise as tolerated.  °

## 2019-09-22 NOTE — Patient Instructions (Signed)
DASH Eating Plan DASH stands for "Dietary Approaches to Stop Hypertension." The DASH eating plan is a healthy eating plan that has been shown to reduce high blood pressure (hypertension). It may also reduce your risk for type 2 diabetes, heart disease, and stroke. The DASH eating plan may also help with weight loss. What are tips for following this plan?  General guidelines  Avoid eating more than 2,300 mg (milligrams) of salt (sodium) a day. If you have hypertension, you may need to reduce your sodium intake to 1,500 mg a day.  Limit alcohol intake to no more than 1 drink a day for nonpregnant women and 2 drinks a day for men. One drink equals 12 oz of beer, 5 oz of wine, or 1 oz of hard liquor.  Work with your health care provider to maintain a healthy body weight or to lose weight. Ask what an ideal weight is for you.  Get at least 30 minutes of exercise that causes your heart to beat faster (aerobic exercise) most days of the week. Activities may include walking, swimming, or biking.  Work with your health care provider or diet and nutrition specialist (dietitian) to adjust your eating plan to your individual calorie needs. Reading food labels   Check food labels for the amount of sodium per serving. Choose foods with less than 5 percent of the Daily Value of sodium. Generally, foods with less than 300 mg of sodium per serving fit into this eating plan.  To find whole grains, look for the word "whole" as the first word in the ingredient list. Shopping  Buy products labeled as "low-sodium" or "no salt added."  Buy fresh foods. Avoid canned foods and premade or frozen meals. Cooking  Avoid adding salt when cooking. Use salt-free seasonings or herbs instead of table salt or sea salt. Check with your health care provider or pharmacist before using salt substitutes.  Do not fry foods. Cook foods using healthy methods such as baking, boiling, grilling, and broiling instead.  Cook with  heart-healthy oils, such as olive, canola, soybean, or sunflower oil. Meal planning  Eat a balanced diet that includes: ? 5 or more servings of fruits and vegetables each day. At each meal, try to fill half of your plate with fruits and vegetables. ? Up to 6-8 servings of whole grains each day. ? Less than 6 oz of lean meat, poultry, or fish each day. A 3-oz serving of meat is about the same size as a deck of cards. One egg equals 1 oz. ? 2 servings of low-fat dairy each day. ? A serving of nuts, seeds, or beans 5 times each week. ? Heart-healthy fats. Healthy fats called Omega-3 fatty acids are found in foods such as flaxseeds and coldwater fish, like sardines, salmon, and mackerel.  Limit how much you eat of the following: ? Canned or prepackaged foods. ? Food that is high in trans fat, such as fried foods. ? Food that is high in saturated fat, such as fatty meat. ? Sweets, desserts, sugary drinks, and other foods with added sugar. ? Full-fat dairy products.  Do not salt foods before eating.  Try to eat at least 2 vegetarian meals each week.  Eat more home-cooked food and less restaurant, buffet, and fast food.  When eating at a restaurant, ask that your food be prepared with less salt or no salt, if possible. What foods are recommended? The items listed may not be a complete list. Talk with your dietitian about   what dietary choices are best for you. Grains Whole-grain or whole-wheat bread. Whole-grain or whole-wheat pasta. Brown rice. Oatmeal. Quinoa. Bulgur. Whole-grain and low-sodium cereals. Pita bread. Low-fat, low-sodium crackers. Whole-wheat flour tortillas. Vegetables Fresh or frozen vegetables (raw, steamed, roasted, or grilled). Low-sodium or reduced-sodium tomato and vegetable juice. Low-sodium or reduced-sodium tomato sauce and tomato paste. Low-sodium or reduced-sodium canned vegetables. Fruits All fresh, dried, or frozen fruit. Canned fruit in natural juice (without  added sugar). Meat and other protein foods Skinless chicken or turkey. Ground chicken or turkey. Pork with fat trimmed off. Fish and seafood. Egg whites. Dried beans, peas, or lentils. Unsalted nuts, nut butters, and seeds. Unsalted canned beans. Lean cuts of beef with fat trimmed off. Low-sodium, lean deli meat. Dairy Low-fat (1%) or fat-free (skim) milk. Fat-free, low-fat, or reduced-fat cheeses. Nonfat, low-sodium ricotta or cottage cheese. Low-fat or nonfat yogurt. Low-fat, low-sodium cheese. Fats and oils Soft margarine without trans fats. Vegetable oil. Low-fat, reduced-fat, or light mayonnaise and salad dressings (reduced-sodium). Canola, safflower, olive, soybean, and sunflower oils. Avocado. Seasoning and other foods Herbs. Spices. Seasoning mixes without salt. Unsalted popcorn and pretzels. Fat-free sweets. What foods are not recommended? The items listed may not be a complete list. Talk with your dietitian about what dietary choices are best for you. Grains Baked goods made with fat, such as croissants, muffins, or some breads. Dry pasta or rice meal packs. Vegetables Creamed or fried vegetables. Vegetables in a cheese sauce. Regular canned vegetables (not low-sodium or reduced-sodium). Regular canned tomato sauce and paste (not low-sodium or reduced-sodium). Regular tomato and vegetable juice (not low-sodium or reduced-sodium). Pickles. Olives. Fruits Canned fruit in a light or heavy syrup. Fried fruit. Fruit in cream or butter sauce. Meat and other protein foods Fatty cuts of meat. Ribs. Fried meat. Bacon. Sausage. Bologna and other processed lunch meats. Salami. Fatback. Hotdogs. Bratwurst. Salted nuts and seeds. Canned beans with added salt. Canned or smoked fish. Whole eggs or egg yolks. Chicken or turkey with skin. Dairy Whole or 2% milk, cream, and half-and-half. Whole or full-fat cream cheese. Whole-fat or sweetened yogurt. Full-fat cheese. Nondairy creamers. Whipped toppings.  Processed cheese and cheese spreads. Fats and oils Butter. Stick margarine. Lard. Shortening. Ghee. Bacon fat. Tropical oils, such as coconut, palm kernel, or palm oil. Seasoning and other foods Salted popcorn and pretzels. Onion salt, garlic salt, seasoned salt, table salt, and sea salt. Worcestershire sauce. Tartar sauce. Barbecue sauce. Teriyaki sauce. Soy sauce, including reduced-sodium. Steak sauce. Canned and packaged gravies. Fish sauce. Oyster sauce. Cocktail sauce. Horseradish that you find on the shelf. Ketchup. Mustard. Meat flavorings and tenderizers. Bouillon cubes. Hot sauce and Tabasco sauce. Premade or packaged marinades. Premade or packaged taco seasonings. Relishes. Regular salad dressings. Where to find more information:  National Heart, Lung, and Blood Institute: www.nhlbi.nih.gov  American Heart Association: www.heart.org Summary  The DASH eating plan is a healthy eating plan that has been shown to reduce high blood pressure (hypertension). It may also reduce your risk for type 2 diabetes, heart disease, and stroke.  With the DASH eating plan, you should limit salt (sodium) intake to 2,300 mg a day. If you have hypertension, you may need to reduce your sodium intake to 1,500 mg a day.  When on the DASH eating plan, aim to eat more fresh fruits and vegetables, whole grains, lean proteins, low-fat dairy, and heart-healthy fats.  Work with your health care provider or diet and nutrition specialist (dietitian) to adjust your eating plan to your   individual calorie needs. This information is not intended to replace advice given to you by your health care provider. Make sure you discuss any questions you have with your health care provider. Document Revised: 03/22/2017 Document Reviewed: 04/02/2016 Elsevier Patient Education  2020 Elsevier Inc.  

## 2019-09-22 NOTE — Progress Notes (Signed)
Patient ID: Makayla Good, female    DOB: 06/11/1970  Age: 49 y.o. MRN: [id number]    Subjective:  Subjective  HPI Stan Head Muessig presents for f/u bp   No complaints   Review of Systems  Constitutional: Negative for appetite change, diaphoresis, fatigue and unexpected weight change.  Eyes: Negative for pain, redness and visual disturbance.  Respiratory: Negative for cough, chest tightness, shortness of breath and wheezing.   Cardiovascular: Negative for chest pain, palpitations and leg swelling.  Endocrine: Negative for cold intolerance, heat intolerance, polydipsia, polyphagia and polyuria.  Genitourinary: Negative for difficulty urinating, dysuria and frequency.  Neurological: Negative for dizziness, light-headedness, numbness and headaches.    History Past Medical History:  Diagnosis Date  . Asthma   . Eczema   . Fibroid   . H/O oophorectomy   . Hypertension   . Obesity   . Polycystic ovary    Left  . Sleep apnea    no CPAP per MD    She has a past surgical history that includes Cesarean section (02/01/2005); Tubal ligation (02/01/2005); Cesarean section (2001); Laparoscopic gastric sleeve resection (N/A, 07/29/2017); Oophorectomy; Breast biopsy (Right, 2019); and Back surgery.   Her family history includes Arthritis in her sister; Coronary artery disease in her father; Dementia in her maternal aunt and mother; Hypertension in her father, mother, sister, and sister; Kidney disease in an other family member; Mental illness in her mother; Schizophrenia in her mother.She reports that she has been smoking cigarettes. She started smoking about 26 years ago. She has a 8.00 pack-year smoking history. She has never used smokeless tobacco. She reports current alcohol use of about 1.0 standard drinks of alcohol per week. She reports that she does not use drugs.  Current Outpatient Medications on File Prior to Visit  Medication Sig Dispense Refill  . Biotin w/  Vitamins C & E (HAIR/SKIN/NAILS PO) Take 1 tablet by mouth daily.    . Multiple Vitamins-Minerals (CELEBRATE MULTI-COMPLETE 36 PO) Take 1 each by mouth daily.     No current facility-administered medications on file prior to visit.     Objective:  Objective  Physical Exam Vitals and nursing note reviewed.  Constitutional:      Appearance: She is well-developed.  HENT:     Head: Normocephalic and atraumatic.  Eyes:     Conjunctiva/sclera: Conjunctivae normal.  Neck:     Thyroid: No thyromegaly.     Vascular: No carotid bruit or JVD.  Cardiovascular:     Rate and Rhythm: Normal rate and regular rhythm.     Heart sounds: Normal heart sounds. No murmur.  Pulmonary:     Effort: Pulmonary effort is normal. No respiratory distress.     Breath sounds: Normal breath sounds. No wheezing or rales.  Chest:     Chest wall: No tenderness.  Musculoskeletal:     Cervical back: Normal range of motion and neck supple.  Neurological:     Mental Status: She is alert and oriented to person, place, and time.    BP 124/78 (BP Location: Right Arm, Patient Position: Sitting, Cuff Size: Large)   Pulse (!) 106   Temp 97.7 F (36.5 C) (Temporal)   Resp 18   Ht 5' 5.5" (1.664 m)   Wt 187 lb 3.2 oz (84.9 kg)   SpO2 98%   BMI 30.68 kg/m  Wt Readings from Last 3 Encounters:  09/22/19 187 lb 3.2 oz (84.9 kg)  08/19/19 191 lb 6.4 oz (86.8 kg)  04/08/19 192 lb (87.1 kg)     Lab Results  Component Value Date   WBC 5.8 02/13/2019   HGB 13.2 02/13/2019   HCT 39.3 02/13/2019   PLT 183.0 02/13/2019   GLUCOSE 80 02/13/2019   CHOL 185 02/13/2019   TRIG 89.0 02/13/2019   HDL 60.10 02/13/2019   LDLCALC 107 (H) 02/13/2019   ALT 23 02/13/2019   AST 22 02/13/2019   NA 139 02/13/2019   K 4.0 02/13/2019   CL 103 02/13/2019   CREATININE 0.70 02/13/2019   BUN 15 02/13/2019   CO2 29 02/13/2019   TSH 1.71 02/13/2019   HGBA1C 6.5 07/01/2015   MICROALBUR <0.7 01/23/2016    No results found.     Assessment & Plan:  Plan  I have changed Laquetta R. Selman's amLODipine and hydrochlorothiazide. I am also having her maintain her Multiple Vitamins-Minerals (CELEBRATE MULTI-COMPLETE 36 PO) and Biotin w/ Vitamins C & E (HAIR/SKIN/NAILS PO).  Meds ordered this encounter  Medications  . amLODipine (NORVASC) 2.5 MG tablet    Sig: 1 po qd    Dispense:  90 tablet    Refill:  1  . hydrochlorothiazide (HYDRODIURIL) 25 MG tablet    Sig: 1 po qd    Dispense:  90 tablet    Refill:  1    Problem List Items Addressed This Visit      Unprioritized   Essential hypertension - Primary    Well controlled, no changes to meds. Encouraged heart healthy diet such as the DASH diet and exercise as tolerated.       Relevant Medications   amLODipine (NORVASC) 2.5 MG tablet   hydrochlorothiazide (HYDRODIURIL) 25 MG tablet   Other Relevant Orders   Lipid panel   Comprehensive metabolic panel   Lower extremity edema    Can inc hctz to 25 mg but really think it is lymphedema       Relevant Medications   hydrochlorothiazide (HYDRODIURIL) 25 MG tablet   Other Relevant Orders   Ambulatory referral to Physical Therapy   Lymphedema    Refer to pt      Relevant Orders   Ambulatory referral to Physical Therapy      Follow-up: Return in about 6 months (around 03/23/2020), or if symptoms worsen or fail to improve, for annual exam, fasting.  [name], DO

## 2019-09-22 NOTE — Assessment & Plan Note (Signed)
Refer to pt  

## 2019-09-23 ENCOUNTER — Other Ambulatory Visit: Payer: Self-pay

## 2019-09-23 ENCOUNTER — Other Ambulatory Visit: Payer: Self-pay | Admitting: Family Medicine

## 2019-09-23 DIAGNOSIS — E876 Hypokalemia: Secondary | ICD-10-CM

## 2019-09-23 LAB — COMPREHENSIVE METABOLIC PANEL
ALT: 18 U/L (ref 0–35)
AST: 19 U/L (ref 0–37)
Albumin: 4.2 g/dL (ref 3.5–5.2)
Alkaline Phosphatase: 72 U/L (ref 39–117)
BUN: 12 mg/dL (ref 6–23)
CO2: 29 mEq/L (ref 19–32)
Calcium: 9.7 mg/dL (ref 8.4–10.5)
Chloride: 103 mEq/L (ref 96–112)
Creatinine, Ser: 0.77 mg/dL (ref 0.40–1.20)
GFR: 96.46 mL/min (ref >60.00)
Glucose, Bld: 105 mg/dL — ABNORMAL HIGH (ref 70–99)
Potassium: 3.4 mEq/L — ABNORMAL LOW (ref 3.5–5.1)
Sodium: 139 mEq/L (ref 135–145)
Total Bilirubin: 0.5 mg/dL (ref 0.2–1.2)
Total Protein: 6.7 g/dL (ref 6.0–8.3)

## 2019-09-23 LAB — LIPID PANEL
Cholesterol: 178 mg/dL (ref 0–200)
HDL: 59.1 mg/dL (ref >39.00)
LDL Cholesterol: 97 mg/dL (ref 0–99)
NonHDL: 119.3
Total CHOL/HDL Ratio: 3
Triglycerides: 112 mg/dL (ref 0.0–149.0)
VLDL: 22.4 mg/dL (ref 0.0–40.0)

## 2019-09-23 MED ORDER — POTASSIUM CHLORIDE ER 10 MEQ PO TBCR: 10.0000 meq | EXTENDED_RELEASE_TABLET | Freq: Every day | ORAL | 2 refills | Status: DC

## 2019-09-30 ENCOUNTER — Other Ambulatory Visit: Payer: Self-pay

## 2019-09-30 ENCOUNTER — Encounter: Payer: BC Managed Care – PPO | Attending: Surgery | Admitting: Skilled Nursing Facility1

## 2019-09-30 DIAGNOSIS — E669 Obesity, unspecified: Secondary | ICD-10-CM | POA: Diagnosis not present

## 2019-09-30 NOTE — Progress Notes (Signed)
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, able to walk without pain, improve feet issues"   Non scale victories: walking faster on treadmill, improve knee pain, able to move better, wearing smaller sizes, decreased pants by 3 sizes, improved feet issues   2 Year + Post-Op Sleeve Gastrectomy  Surgery date: 07/29/2017  Pt states she does take her multi every day but does not take her calcium daily. Pt states she throws up due to not chewing well enough about every 2 weeks and has diarrhea about 1-2 times a month.  Pt states she has had swollen lower legs for 5+years.  Pt states she restarted her blood pressure medicine about 6 months ago.  Pt did have hypokalemia seemingly due to the potassium wasting drugs she is taking without being on a potassium supplement. Pts multivitamin does not have any potassium.   NUTRITION ASSESSMENT  Start weight at [facility]: 273.4 lbs Weight today: 190 lbs  Body Composition  Scale 09/30/2018 04/01/2019 09/30/2019  Weight  lbs  192.3 188.3 190  BMI  32 31.3 31.3  Total Body Fat % 37.4 36.9 37.2     Visceral Fat 10 10 10   Fat-Free Mass % 62.5 63 62.7     Total Body Water % 45.7 46 45.8     Muscle-Mass lbs 31.8 34.6 31.7  Body Fat Displacement            Torso  lbs 44.5 43 43.8         Left Leg  lbs 8.9 8.6 8.7         Right Leg  lbs 8.9 8.6 8.7         Left Arm  lbs 4.4 4.3 4.3         Right Arm   lbs 4.4 4.3 4.3   24-Hr Dietary Recall B: 2 eggs + cheese + 2 [place] bacon + spinach Snk: peanut butter crackers  L: wheat thins sandwich bread + [place] or [place] burger + cheese + mustard Snk: yogurt or half banana + peanut butter D: chicken salad Snk: sugar free pudding Bev: water, decaf/caffeinated coffee + cinomon +  cream + sweet n low, cran/apple juice  Estimated daily fluid intake: 50-67 ounces  Estimated daily protein intake: 60+ grams  Medications: See list Supplementation: ProCare, calcium, and hair/skin/nail vitamin   Recent physical activity: 3-4 days a week 20-25 minutes cardio and then weights   Using straws: no Drinking while eating: no Chewing/swallowing difficulties: no Changes in vision: no Changes to mood/headaches: no Hair loss/Changes to skin/nails: no Any difficulty focusing or concentrating: no Sweating: no Dizziness/Lightheadedness: no Palpitations: no  Carbonated beverages: no N/V/D/C/GAS: no Abdominal Pain: no Dumping syndrome: no    NUTRITION DIAGNOSIS [place]-3.3: Overweight/obesity related to past poor dietary habits and physical inactivity as evidenced by patient w/ recent sleeve gastrectomy surgery following dietary guidelines for continued weight loss.     NUTRITION INTERVENTION Nutrition education and counseling to promote continued progress post bariatric surgery.   Goals: - 2 times a day 7 days  non starchy vegetables -Take your calcium every day; get back into the routine -Aim for a minimum 64 fluid ounces per day -Aim for 45-60 minutes 5-6 days a week with 2-3 being strength training  -As long as you take your blood pressure medicine  continue to take a potassium supplement   Teaching Method Utilized: Visual, Auditory, Hands on Barriers to learning/adherence to lifestyle change: None Identified  Demonstrated degree of understanding via:  Teach Back    MONITORING / EVALUATION Dietary intake, exercise, body weight, and goals at next nutrition visit.   Patient is to return to [facility] in 6 months

## 2019-11-03 ENCOUNTER — Other Ambulatory Visit: Payer: Self-pay | Admitting: Family Medicine

## 2019-11-03 DIAGNOSIS — Z1231 Encounter for screening mammogram for malignant neoplasm of breast: Secondary | ICD-10-CM

## 2019-11-12 DIAGNOSIS — M25561 Pain in right knee: Secondary | ICD-10-CM | POA: Diagnosis not present

## 2019-11-12 DIAGNOSIS — M25551 Pain in right hip: Secondary | ICD-10-CM | POA: Diagnosis not present

## 2019-12-07 ENCOUNTER — Ambulatory Visit
Admission: RE | Admit: 2019-12-07 | Discharge: 2019-12-07 | Disposition: A | Discharge status: Home / Self Care | Payer: BC Managed Care – PPO | Source: Ambulatory Visit | Attending: Family Medicine | Admitting: Family Medicine

## 2019-12-07 ENCOUNTER — Other Ambulatory Visit: Payer: Self-pay

## 2019-12-07 DIAGNOSIS — Z1231 Encounter for screening mammogram for malignant neoplasm of breast: Secondary | ICD-10-CM

## 2019-12-19 ENCOUNTER — Other Ambulatory Visit: Payer: Self-pay | Admitting: Family Medicine

## 2020-02-16 ENCOUNTER — Ambulatory Visit: Payer: BC Managed Care – PPO | Admitting: Family Medicine

## 2020-02-16 ENCOUNTER — Encounter: Payer: Self-pay | Admitting: Family Medicine

## 2020-02-16 ENCOUNTER — Other Ambulatory Visit: Payer: Self-pay

## 2020-02-16 VITALS — BP 128/88 | HR 92 | Temp 99.4°F | Resp 18 | Ht 65.5 in | Wt 193.6 lb

## 2020-02-16 DIAGNOSIS — F17200 Nicotine dependence, unspecified, uncomplicated: Secondary | ICD-10-CM | POA: Diagnosis not present

## 2020-02-16 DIAGNOSIS — R202 Paresthesia of skin: Secondary | ICD-10-CM | POA: Diagnosis not present

## 2020-02-16 DIAGNOSIS — R200 Anesthesia of skin: Secondary | ICD-10-CM

## 2020-02-16 MED ORDER — CHANTIX STARTING MONTH PAK 0.5 MG X 11 & 1 MG X 42 PO TABS: ORAL_TABLET | ORAL | 0 refills | Status: DC

## 2020-02-16 NOTE — Progress Notes (Signed)
Patient ID: Makayla Good, female    DOB: 06/06/1970  Age: 49 y.o. MRN: [id number]    Subjective:  Subjective  HPI Makayla Good presents for r arm pain x 2 months.  She has tried wrist splint but it did not help.  No hx of injury/ fall.  She c/o tingling in fingers/ hand   Symptoms are worsening She also wants to quit smoking and would like to try chantix again .   Review of Systems  Constitutional: Negative for appetite change, diaphoresis, fatigue and unexpected weight change.  Eyes: Negative for pain, redness and visual disturbance.  Respiratory: Negative for cough, chest tightness, shortness of breath and wheezing.   Cardiovascular: Negative for chest pain, palpitations and leg swelling.  Endocrine: Negative for cold intolerance, heat intolerance, polydipsia, polyphagia and polyuria.  Genitourinary: Negative for difficulty urinating, dysuria and frequency.  Musculoskeletal: Negative for arthralgias, back pain, gait problem, joint swelling, myalgias, neck pain and neck stiffness.  Neurological: Positive for numbness. Negative for dizziness, weakness, light-headedness and headaches.    History Past Medical History:  Diagnosis Date  . Asthma   . Eczema   . Fibroid   . H/O oophorectomy   . Hypertension   . Obesity   . Polycystic ovary    Left  . Sleep apnea    no CPAP per MD    She has a past surgical history that includes Cesarean section (02/01/2005); Tubal ligation (02/01/2005); Cesarean section (2001); Laparoscopic gastric sleeve resection (N/A, 07/29/2017); Oophorectomy; Breast biopsy (Right, 2019); and Back surgery.   Her family history includes Arthritis in her sister; Coronary artery disease in her father; Dementia in her maternal aunt and mother; Hypertension in her father, mother, sister, and sister; Kidney disease in an other family member; Mental illness in her mother; Schizophrenia in her mother.She reports that she has been smoking  cigarettes. She started smoking about 26 years ago. She has a 15.00 pack-year smoking history. She has never used smokeless tobacco. She reports current alcohol use of about 1.0 standard drink of alcohol per week. She reports that she does not use drugs.  Current Outpatient Medications on File Prior to Visit  Medication Sig Dispense Refill  . amLODipine (NORVASC) 2.5 MG tablet 1 po qd 90 tablet 1  . Biotin w/ Vitamins C & E (HAIR/SKIN/NAILS PO) Take 1 tablet by mouth daily.    . hydrochlorothiazide (HYDRODIURIL) 25 MG tablet 1 po qd 90 tablet 1  . Multiple Vitamins-Minerals (CELEBRATE MULTI-COMPLETE 36 PO) Take 1 each by mouth daily.    . potassium chloride (KLOR-CON) 10 MEQ tablet TAKE 1 TABLET(10 MEQ) BY MOUTH DAILY 30 tablet 2   No current facility-administered medications on file prior to visit.     Objective:  Objective  Physical Exam Vitals and nursing note reviewed.  Constitutional:      Appearance: She is well-developed.  HENT:     Head: Normocephalic and atraumatic.  Eyes:     Conjunctiva/sclera: Conjunctivae normal.  Neck:     Thyroid: No thyromegaly.     Vascular: No carotid bruit or JVD.  Cardiovascular:     Rate and Rhythm: Normal rate and regular rhythm.     Heart sounds: Normal heart sounds. No murmur heard.   Pulmonary:     Effort: Pulmonary effort is normal. No respiratory distress.     Breath sounds: Normal breath sounds. No wheezing or rales.  Chest:     Chest wall: No tenderness.  Musculoskeletal:  General: No tenderness or signs of injury.     Cervical back: Normal range of motion and neck supple.  Neurological:     Mental Status: She is alert and oriented to person, place, and time.     Sensory: Sensory deficit present.     Comments: Numbness and tingling in r hand with radiation of pain to shoulder --  Pt has full rom     BP 128/88 (BP Location: Right Arm, Patient Position: Sitting, Cuff Size: Normal)   Pulse 92   Temp 99.4 F (37.4 C)  (Oral)   Resp 18   Ht 5' 5.5" (1.664 m)   Wt 193 lb 9.6 oz (87.8 kg)   SpO2 98%   BMI 31.73 kg/m  Wt Readings from Last 3 Encounters:  02/16/20 193 lb 9.6 oz (87.8 kg)  09/30/19 190 lb (86.2 kg)  09/22/19 187 lb 3.2 oz (84.9 kg)     Lab Results  Component Value Date   WBC 5.8 02/13/2019   HGB 13.2 02/13/2019   HCT 39.3 02/13/2019   PLT 183.0 02/13/2019   GLUCOSE 105 (H) 09/22/2019   CHOL 178 09/22/2019   TRIG 112.0 09/22/2019   HDL 59.10 09/22/2019   LDLCALC 97 09/22/2019   ALT 18 09/22/2019   AST 19 09/22/2019   NA 139 09/22/2019   K 3.4 (L) 09/22/2019   CL 103 09/22/2019   CREATININE 0.77 09/22/2019   BUN 12 09/22/2019   CO2 29 09/22/2019   TSH 1.71 02/13/2019   HGBA1C 6.5 07/01/2015   MICROALBUR <0.7 01/23/2016    MM 3D SCREEN BREAST BILATERAL  Result Date: 12/08/2019 CLINICAL DATA:  Screening. EXAM: DIGITAL SCREENING BILATERAL MAMMOGRAM WITH TOMO AND CAD COMPARISON:  Previous exam(s). ACR Breast Density Category c: The breast tissue is heterogeneously dense, which may obscure small masses. FINDINGS: There are no findings suspicious for malignancy. Images were processed with CAD. IMPRESSION: No mammographic evidence of malignancy. A result letter of this screening mammogram will be mailed directly to the patient. RECOMMENDATION: Screening mammogram in one year. (Code:SM-B-01Y) BI-RADS CATEGORY  1: Negative. Electronically Signed   By: Valentino Saxon MD   On: 12/08/2019 13:07     Assessment & Plan:  Plan  I am having Makayla Good start on Chantix Starting [employer]. I am also having her maintain her Multiple Vitamins-Minerals (CELEBRATE MULTI-COMPLETE 36 PO), Biotin w/ Vitamins C & E (HAIR/SKIN/NAILS PO), amLODipine, hydrochlorothiazide, and potassium chloride.  Meds ordered this encounter  Medications  . varenicline (CHANTIX STARTING MONTH PAK) 0.5 MG X 11 & 1 MG X 42 tablet    Sig: Take one 0.5 mg tablet by mouth once daily for 3 days, then increase  to one 0.5 mg tablet twice daily for 4 days, then increase to one 1 mg tablet twice daily.    Dispense:  53 tablet    Refill:  0    Problem List Items Addressed This Visit    None    Visit Diagnoses    Numbness and tingling of right arm    -  Primary   Relevant Orders   Ambulatory referral to Orthopedic Surgery   Current every day smoker       Relevant Medications   varenicline (CHANTIX STARTING MONTH PAK) 0.5 MG X 11 & 1 MG X 42 tablet      Follow-up: Return in about 4 weeks (around 03/15/2020), or if symptoms worsen or fail to improve, for +.  [name], DO

## 2020-02-16 NOTE — Patient Instructions (Signed)
Coping with Quitting Smoking  Quitting smoking is a physical and mental challenge. You will face cravings, withdrawal symptoms, and temptation. Before quitting, work with your health care provider to make a plan that can help you cope. Preparation can help you quit and keep you from giving in. How can I cope with cravings? Cravings usually last for 5-10 minutes. If you get through it, the craving will pass. Consider taking the following actions to help you cope with cravings:  Keep your mouth busy: ? Chew sugar-free gum. ? Suck on hard candies or a straw. ? Brush your teeth.  Keep your hands and body busy: ? Immediately change to a different activity when you feel a craving. ? Squeeze or play with a ball. ? Do an activity or a hobby, like making bead jewelry, practicing needlepoint, or working with wood. ? Mix up your normal routine. ? Take a short exercise break. Go for a quick walk or run up and down stairs. ? Spend time in public places where smoking is not allowed.  Focus on doing something kind or helpful for someone else.  Call a friend or family member to talk during a craving.  Join a support group.  Call a quit line, such as 1-800-QUIT-NOW.  Talk with your health care provider about medicines that might help you cope with cravings and make quitting easier for you. How can I deal with withdrawal symptoms? Your body may experience negative effects as it tries to get used to not having nicotine in the system. These effects are called withdrawal symptoms. They may include:  Feeling hungrier than normal.  Trouble concentrating.  Irritability.  Trouble sleeping.  Feeling depressed.  Restlessness and agitation.  Craving a cigarette. To manage withdrawal symptoms:  Avoid places, people, and activities that trigger your cravings.  Remember why you want to quit.  Get plenty of sleep.  Avoid coffee and other caffeinated drinks. These may worsen some of your  symptoms. How can I handle social situations? Social situations can be difficult when you are quitting smoking, especially in the first few weeks. To manage this, you can:  Avoid parties, bars, and other social situations where people might be smoking.  Avoid alcohol.  Leave right away if you have the urge to smoke.  Explain to your family and friends that you are quitting smoking. Ask for understanding and support.  Plan activities with friends or family where smoking is not an option. What are some ways I can cope with stress? Wanting to smoke may cause stress, and stress can make you want to smoke. Find ways to manage your stress. Relaxation techniques can help. For example:  Breathe slowly and deeply, in through your nose and out through your mouth.  Listen to soothing, relaxing music.  Talk with a family member or friend about your stress.  Light a candle.  Soak in a bath or take a shower.  Think about a peaceful place. What are some ways I can prevent weight gain? Be aware that many people gain weight after they quit smoking. However, not everyone does. To keep from gaining weight, have a plan in place before you quit and stick to the plan after you quit. Your plan should include:  Having healthy snacks. When you have a craving, it may help to: ? Eat plain popcorn, crunchy carrots, celery, or other cut vegetables. ? Chew sugar-free gum.  Changing how you eat: ? Eat small portion sizes at meals. ? Eat 4-6 small meals   throughout the day instead of 1-2 large meals a day. ? Be mindful when you eat. Do not watch television or do other things that might distract you as you eat.  Exercising regularly: ? Make time to exercise each day. If you do not have time for a long workout, do short bouts of exercise for 5-10 minutes several times a day. ? Do some form of strengthening exercise, like weight lifting, and some form of aerobic exercise, like running or swimming.  Drinking  plenty of water or other low-calorie or no-calorie drinks. Drink 6-8 glasses of water daily, or as much as instructed by your health care provider. Summary  Quitting smoking is a physical and mental challenge. You will face cravings, withdrawal symptoms, and temptation to smoke again. Preparation can help you as you go through these challenges.  You can cope with cravings by keeping your mouth busy (such as by chewing gum), keeping your body and hands busy, and making calls to family, friends, or a helpline for people who want to quit smoking.  You can cope with withdrawal symptoms by avoiding places where people smoke, avoiding drinks with caffeine, and getting plenty of rest.  Ask your health care provider about the different ways to prevent weight gain, avoid stress, and handle social situations. This information is not intended to replace advice given to you by your health care provider. Make sure you discuss any questions you have with your health care provider. Document Revised: 03/22/2017 Document Reviewed: 04/06/2016 Elsevier Patient Education  2020 Elsevier Inc.  

## 2020-02-18 ENCOUNTER — Telehealth: Payer: Self-pay | Admitting: Family Medicine

## 2020-02-18 NOTE — Telephone Encounter (Signed)
Please advise 

## 2020-02-18 NOTE — Telephone Encounter (Signed)
Ask Makayla Good--- I think they found a way for patients to get it from [place]

## 2020-02-18 NOTE — Telephone Encounter (Signed)
Patient states the pharmacy states Chantix has been pulled from the market. Patient is requesting a substitute sent it.  varenicline (CHANTIX STARTING MONTH PAK) 0.5 MG X 11 & 1 MG X 42 tablet [[id number]]      Please Advise

## 2020-02-18 NOTE — Telephone Encounter (Signed)
I believe certain pharmacies have the [place] brand but unsure which one's.

## 2020-02-22 ENCOUNTER — Ambulatory Visit: Payer: BC Managed Care – PPO | Admitting: Physician Assistant

## 2020-02-22 ENCOUNTER — Encounter: Payer: Self-pay | Admitting: Physician Assistant

## 2020-02-22 DIAGNOSIS — M79601 Pain in right arm: Secondary | ICD-10-CM | POA: Diagnosis not present

## 2020-02-22 NOTE — Addendum Note (Signed)
Addended by: Robyne Peers on: 02/22/2020 04:19 PM   Modules accepted: Orders

## 2020-02-22 NOTE — Telephone Encounter (Signed)
There is no substitute

## 2020-02-22 NOTE — Progress Notes (Signed)
Office Visit Note   Patient: Makayla Good           Date of Birth: 04-28-1970           MRN: [id number] Visit Date: 02/22/2020              Requested by: [address] [place], [place] [address] [place],  [place] [address] PCP: Makayla Good, Makayla Apa, DO   Assessment & Plan: Visit Diagnoses:  1. Arm pain, anterior, right     Plan: We will obtain EMG nerve conduction studies to rule out carpal tunnel syndrome involving the right arm.  She will follow-up with [place] after the EMG nerve conduction studies to go over the results and discuss further treatment.  Questions were encouraged and answered at length today.  Follow-Up Instructions: Return After EMG nerve conduction studies.   Orders:  No orders of the defined types were placed in this encounter.  No orders of the defined types were placed in this encounter.     Procedures: No procedures performed   Clinical Data: No additional findings.   Subjective: Chief Complaint  Patient presents with  . Right Arm - Numbness, Tingling    HPI Makayla Good is a 49 year old female who comes in today with right arm pain numbness and tingling its been ongoing for the past 2 to 3 months.  She states that numbness first began in her long and index finger.  She notes that she often awakens and has to shake her hand because it is numb and tingling.  She also has numbness tingling in the right arm when using the mouse.  Numbness tingling and pain travel up the arm.  She has had no injuries.  She denies any neck pain.  She has no symptoms on the right.  Patient is nondiabetic. Review of Systems See HPI otherwise negative or noncontributory.  Objective: Vital Signs: There were no vitals taken for this visit.  Physical Exam Constitutional:      Appearance: She is not ill-appearing or diaphoretic.  Cardiovascular:     Pulses: Normal pulses.  Pulmonary:     Effort: Pulmonary effort is normal.  Neurological:      Mental Status: She is alert and oriented to person, place, and time.  Psychiatric:        Mood and Affect: Mood normal.     Ortho Exam Bilateral hands.  Sensation except for the right thumb with slight subjective decreased sensation light touch.  Full motor bilateral hands.  Positive Tinel's over the median nerve of the right wrist.  Otherwise negative Phalen's negative compression bilaterally.  No thenar atrophy is noted right hand.  She has full range of motion of the right forearm elbow without pain.  Specialty Comments:  No specialty comments available.  Imaging: No results found.   PMFS History: Patient Active Problem List   Diagnosis Date Noted  . Lower extremity edema 09/22/2019  . Lymphedema 09/22/2019  . Essential hypertension 08/05/2017  . Hypokalemia 08/05/2017  . OSA (obstructive sleep apnea) 03/05/2017  . Asthma 03/05/2017  . Dizziness 02/08/2017  . Preventative health care 02/08/2017  . Abdominal pain 07/05/2016  . Right upper quadrant abdominal pain 07/05/2016  . Irregular periods 07/26/2014  . Dysuria 02/04/2014  . Severe obesity (BMI >= 40) ([place]) 10/21/2012  . OA (osteoarthritis) of knee 06/04/2012  . Cough 03/05/2012  . Plantar fasciitis 09/04/2010  . HYPOKALEMIA 07/01/2009  . CHEST PAIN UNSPECIFIED 05/18/2009  .  DEPRESSION 03/07/2009  . FATIGUE 03/07/2009  . TOBACCO USER 12/03/2008  . Morbid obesity ([place]) 05/21/2008  . Hyperlipidemia LDL goal <100 05/22/2007  . DYSMETABOLIC SYNDROME X 85/63/1497  . CONJUNCTIVITIS NOS 02/04/2007  . VAGINITIS NOS 11/07/2006  . FUNGAL DERMATITIS 10/09/2006  . Benign essential HTN 10/09/2006   Past Medical History:  Diagnosis Date  . Asthma   . Eczema   . Fibroid   . H/O oophorectomy   . Hypertension   . Obesity   . Polycystic ovary    Left  . Sleep apnea    no CPAP per MD    Family History  Problem Relation Age of Onset  . Hypertension Mother   . Schizophrenia Mother   . Dementia Mother   . Mental  illness Mother        schizophrenia, dementia  . Hypertension Father   . Coronary artery disease Father        Stent  . Hypertension Sister   . Hypertension Sister   . Arthritis Sister        rheumatoid  . Dementia Maternal Aunt   . Kidney disease Other   . Colon cancer Neg Hx   . Stomach cancer Neg Hx   . Pancreatic cancer Neg Hx   . Esophageal cancer Neg Hx   . Rectal cancer Neg Hx     Past Surgical History:  Procedure Laterality Date  . BACK SURGERY     40yrs ago  . BREAST BIOPSY Right 2019   fibroadenoma  . CESAREAN SECTION  02/01/2005  . CESAREAN SECTION  2001  . LAPAROSCOPIC GASTRIC SLEEVE RESECTION N/A 07/29/2017   Procedure: LAPAROSCOPIC GASTRIC SLEEVE RESECTION WITH UPPER ENDO ;  Surgeon: [name], MD;  Location: [facility];  Service: General;  Laterality: N/A;  . OOPHORECTOMY     Rt.removed  . TUBAL LIGATION  02/01/2005   Social History   Occupational History  . Occupation: release Horticulturist, commercial: [employer]  Tobacco Use  . Smoking status: Current Every Day Smoker    Packs/day: 0.50    Years: 30.00    Pack years: 15.00    Types: Cigarettes    Start date: 03/23/1993  . Smokeless tobacco: Never Used  . Tobacco comment: will try chantix   Vaping Use  . Vaping Use: Former  Substance and Sexual Activity  . Alcohol use: Yes    Alcohol/week: 1.0 standard drink    Types: 1 Glasses of wine per week    Comment: occasional  . Drug use: No  . Sexual activity: Yes    Partners: Male    Birth control/protection: Surgical    Comment: Tubal/been with same person over 20 years

## 2020-02-23 MED ORDER — NICOTINE 14 MG/24HR TD PT24: 14.0000 mg | MEDICATED_PATCH | Freq: Every day | TRANSDERMAL | 1 refills | Status: DC

## 2020-02-23 NOTE — Telephone Encounter (Signed)
Patient stated that she would like to try the patches and that her insurance may cover them.  Can you write for some?

## 2020-02-23 NOTE — Telephone Encounter (Signed)
If smoking 10 cig or less/ day-- start with 14 mg pathc qd x 6 weeks

## 2020-02-23 NOTE — Telephone Encounter (Signed)
Patient notified and rx sent in 

## 2020-03-30 ENCOUNTER — Ambulatory Visit: Payer: BC Managed Care – PPO | Admitting: Skilled Nursing Facility1

## 2020-04-01 ENCOUNTER — Telehealth: Payer: Self-pay | Admitting: Family Medicine

## 2020-04-01 DIAGNOSIS — R600 Localized edema: Secondary | ICD-10-CM

## 2020-04-01 DIAGNOSIS — I10 Essential (primary) hypertension: Secondary | ICD-10-CM

## 2020-04-01 MED ORDER — HYDROCHLOROTHIAZIDE 25 MG PO TABS: ORAL_TABLET | ORAL | 1 refills | Status: DC

## 2020-04-01 NOTE — Telephone Encounter (Signed)
Medication:    hydrochlorothiazide (HYDRODIURIL) 25 MG tablet [[id number]]    Has the patient contacted their pharmacy?  (If no, request that the patient contact the pharmacy for the refill.) (If yes, when and what did the pharmacy advise?)     Preferred Pharmacy (with phone number or street name):  [facility] [phone] [place], [place] [phone] AT [facility] [address] [place]  [address] [place] [address]  Phone:  [phone] Fax:  [phone]     Agent: Please be advised that RX refills may take up to 3 business days. We ask that you follow-up with your pharmacy.

## 2020-04-01 NOTE — Telephone Encounter (Signed)
Refill sent.

## 2020-04-08 ENCOUNTER — Encounter: Payer: Self-pay | Admitting: Physical Medicine and Rehabilitation

## 2020-04-08 ENCOUNTER — Other Ambulatory Visit: Payer: Self-pay

## 2020-04-08 ENCOUNTER — Ambulatory Visit: Payer: BC Managed Care – PPO | Admitting: Physical Medicine and Rehabilitation

## 2020-04-08 DIAGNOSIS — R202 Paresthesia of skin: Secondary | ICD-10-CM

## 2020-04-08 NOTE — Progress Notes (Signed)
Tingling in right arm and hand. Worse on radial side.  Right hand dominant No lotion per patient Numeric Pain Rating Scale and Functional Assessment Average Pain 5   In the last MONTH (on 0-10 scale) has pain interfered with the following?  1. General activity like being  able to carry out your everyday physical activities such as walking, climbing stairs, carrying groceries, or moving a chair?  Rating(0)

## 2020-04-12 NOTE — Procedures (Signed)
EMG & NCV Findings: Evaluation of the right median (across palm) sensory nerve showed prolonged distal peak latency (Wrist, 4.1 ms).  All remaining nerves (as indicated in the following tables) were within normal limits.    All examined muscles (as indicated in the following table) showed no evidence of electrical instability.    Impression: The above electrodiagnostic study is ABNORMAL and reveals evidence of a mild right median nerve entrapment at the wrist (carpal tunnel syndrome) affecting sensory components.   There is no significant electrodiagnostic evidence of any other focal nerve entrapment, brachial plexopathy or cervical radiculopathy.   Recommendations: 1.  Follow-up with referring physician. 2.  Continue current management of symptoms. 3.  Continue use of resting splint at night-time and as needed during the day.  ___________________________ [name] Board Certified, American Board of Physical Medicine and Rehabilitation    Nerve Conduction Studies Anti Sensory Summary Table   Stim Site NR Peak (ms) Norm Peak (ms) P-T Amp (V) Norm P-T Amp Site1 Site2 Delta-P (ms) Dist (cm) Vel (m/s) Norm Vel (m/s)  Right Median Acr Palm Anti Sensory (2nd Digit)  30.2C  Wrist    *4.1 <3.6 15.8 >10 Wrist Palm 2.2 0.0    Palm    1.9 <2.0 1.4         Right Radial Anti Sensory (Base 1st Digit)  30.9C  Wrist    2.3 <3.1 13.6  Wrist Base 1st Digit 2.3 0.0    Right Ulnar Anti Sensory (5th Digit)  30.8C  Wrist    3.4 <3.7 32.6 >15.0 Wrist 5th Digit 3.4 14.0 41 >38   Motor Summary Table   Stim Site NR Onset (ms) Norm Onset (ms) O-P Amp (mV) Norm O-P Amp Site1 Site2 Delta-0 (ms) Dist (cm) Vel (m/s) Norm Vel (m/s)  Right Median Motor (Abd Poll Brev)  31.1C  Wrist    4.0 <4.2 8.7 >5 Elbow Wrist 4.0 21.0 53 >50  Elbow    8.0  8.3         Right Ulnar Motor (Abd Dig Min)  31.4C  Wrist    3.3 <4.2 7.5 >3 B Elbow Wrist 3.3 21.0 64 >53  B Elbow    6.6  7.9  A Elbow B Elbow 1.4 10.0  71 >53  A Elbow    8.0  8.0          EMG   Side Muscle Nerve Root Ins Act Fibs Psw Amp Dur Poly Recrt Int Fraser Din Comment  Right Abd Poll Brev Median C8-T1 Nml Nml Nml Nml Nml 0 Nml Nml   Right 1stDorInt Ulnar C8-T1 Nml Nml Nml Nml Nml 0 Nml Nml   Right PronatorTeres Median C6-7 Nml Nml Nml Nml Nml 0 Nml Nml   Right Biceps Musculocut C5-6 Nml Nml Nml Nml Nml 0 Nml Nml   Right Deltoid Axillary C5-6 Nml Nml Nml Nml Nml 0 Nml Nml     Nerve Conduction Studies Anti Sensory Left/Right Comparison   Stim Site L Lat (ms) R Lat (ms) L-R Lat (ms) L Amp (V) R Amp (V) L-R Amp (%) Site1 Site2 L Vel (m/s) R Vel (m/s) L-R Vel (m/s)  Median Acr Palm Anti Sensory (2nd Digit)  30.2C  Wrist  *4.1   15.8  Wrist Palm     Palm  1.9   1.4        Radial Anti Sensory (Base 1st Digit)  30.9C  Wrist  2.3   13.6  Wrist Base 1st Digit  Ulnar Anti Sensory (5th Digit)  30.8C  Wrist  3.4   32.6  Wrist 5th Digit  41    Motor Left/Right Comparison   Stim Site L Lat (ms) R Lat (ms) L-R Lat (ms) L Amp (mV) R Amp (mV) L-R Amp (%) Site1 Site2 L Vel (m/s) R Vel (m/s) L-R Vel (m/s)  Median Motor (Abd Poll Brev)  31.1C  Wrist  4.0   8.7  Elbow Wrist  53   Elbow  8.0   8.3        Ulnar Motor (Abd Dig Min)  31.4C  Wrist  3.3   7.5  B Elbow Wrist  64   B Elbow  6.6   7.9  A Elbow B Elbow  71   A Elbow  8.0   8.0           Waveforms:

## 2020-04-12 NOTE — Progress Notes (Signed)
[name] - 49 y.o. female MRN [id number]  Date of birth: 05/16/1970  Office Visit Note: Visit Date: 04/08/2020 PCP: [name], DO Referred by: [name], *  Subjective: Chief Complaint  Patient presents with  . Right Hand - Numbness   HPI:  Makayla Good is a 49 y.o. female who comes in today at the request of Benita Stabile, PA-C for electrodiagnostic study of the Right upper extremities.  Patient is Right hand dominant.  She reports 3 to 4 months of worsening severe at times tingling and numbness in the hand up into the arm.  She reports this is 5 out of 10 pain.  She denies any specific trauma or frank radicular symptoms.  She does get symptoms worse on the radial digits.  She does wake up at night with tingling with nocturnal complaints.  She does have a positive flick sign.  No prior electrodiagnostic studies.  She is not diabetic.   ROS Otherwise per HPI.  Assessment & Plan: Visit Diagnoses:    ICD-10-CM   1. Paresthesia of skin  R20.2 NCV with EMG (electromyography)    Plan: Impression: The above electrodiagnostic study is ABNORMAL and reveals evidence of a mild right median nerve entrapment at the wrist (carpal tunnel syndrome) affecting sensory components.   There is no significant electrodiagnostic evidence of any other focal nerve entrapment, brachial plexopathy or cervical radiculopathy.   Recommendations: 1.  Follow-up with referring physician. 2.  Continue current management of symptoms. 3.  Continue use of resting splint at night-time and as needed during the day.  Meds & Orders: No orders of the defined types were placed in this encounter.   Orders Placed This Encounter  Procedures  . NCV with EMG (electromyography)    Follow-up: Return for Benita Stabile, P.A.-C.   Procedures: No procedures performed  EMG & NCV Findings: Evaluation of the right median (across palm) sensory nerve showed prolonged distal peak  latency (Wrist, 4.1 ms).  All remaining nerves (as indicated in the following tables) were within normal limits.    All examined muscles (as indicated in the following table) showed no evidence of electrical instability.    Impression: The above electrodiagnostic study is ABNORMAL and reveals evidence of a mild right median nerve entrapment at the wrist (carpal tunnel syndrome) affecting sensory components.   There is no significant electrodiagnostic evidence of any other focal nerve entrapment, brachial plexopathy or cervical radiculopathy.   Recommendations: 1.  Follow-up with referring physician. 2.  Continue current management of symptoms. 3.  Continue use of resting splint at night-time and as needed during the day.  ___________________________ [name] Board Certified, American Board of Physical Medicine and Rehabilitation    Nerve Conduction Studies Anti Sensory Summary Table   Stim Site NR Peak (ms) Norm Peak (ms) P-T Amp (V) Norm P-T Amp Site1 Site2 Delta-P (ms) Dist (cm) Vel (m/s) Norm Vel (m/s)  Right Median Acr Palm Anti Sensory (2nd Digit)  30.2C  Wrist    *4.1 <3.6 15.8 >10 Wrist Palm 2.2 0.0    Palm    1.9 <2.0 1.4         Right Radial Anti Sensory (Base 1st Digit)  30.9C  Wrist    2.3 <3.1 13.6  Wrist Base 1st Digit 2.3 0.0    Right Ulnar Anti Sensory (5th Digit)  30.8C  Wrist    3.4 <3.7 32.6 >15.0 Wrist 5th Digit 3.4 14.0 41 >38  Motor Summary Table   Stim Site NR Onset (ms) Norm Onset (ms) O-P Amp (mV) Norm O-P Amp Site1 Site2 Delta-0 (ms) Dist (cm) Vel (m/s) Norm Vel (m/s)  Right Median Motor (Abd Poll Brev)  31.1C  Wrist    4.0 <4.2 8.7 >5 Elbow Wrist 4.0 21.0 53 >50  Elbow    8.0  8.3         Right Ulnar Motor (Abd Dig Min)  31.4C  Wrist    3.3 <4.2 7.5 >3 B Elbow Wrist 3.3 21.0 64 >53  B Elbow    6.6  7.9  A Elbow B Elbow 1.4 10.0 71 >53  A Elbow    8.0  8.0          EMG   Side Muscle Nerve Root Ins Act Fibs Psw Amp Dur Poly Recrt  Int Fraser Din Comment  Right Abd Poll Brev Median C8-T1 Nml Nml Nml Nml Nml 0 Nml Nml   Right 1stDorInt Ulnar C8-T1 Nml Nml Nml Nml Nml 0 Nml Nml   Right PronatorTeres Median C6-7 Nml Nml Nml Nml Nml 0 Nml Nml   Right Biceps Musculocut C5-6 Nml Nml Nml Nml Nml 0 Nml Nml   Right Deltoid Axillary C5-6 Nml Nml Nml Nml Nml 0 Nml Nml     Nerve Conduction Studies Anti Sensory Left/Right Comparison   Stim Site L Lat (ms) R Lat (ms) L-R Lat (ms) L Amp (V) R Amp (V) L-R Amp (%) Site1 Site2 L Vel (m/s) R Vel (m/s) L-R Vel (m/s)  Median Acr Palm Anti Sensory (2nd Digit)  30.2C  Wrist  *4.1   15.8  Wrist Palm     Palm  1.9   1.4        Radial Anti Sensory (Base 1st Digit)  30.9C  Wrist  2.3   13.6  Wrist Base 1st Digit     Ulnar Anti Sensory (5th Digit)  30.8C  Wrist  3.4   32.6  Wrist 5th Digit  41    Motor Left/Right Comparison   Stim Site L Lat (ms) R Lat (ms) L-R Lat (ms) L Amp (mV) R Amp (mV) L-R Amp (%) Site1 Site2 L Vel (m/s) R Vel (m/s) L-R Vel (m/s)  Median Motor (Abd Poll Brev)  31.1C  Wrist  4.0   8.7  Elbow Wrist  53   Elbow  8.0   8.3        Ulnar Motor (Abd Dig Min)  31.4C  Wrist  3.3   7.5  B Elbow Wrist  64   B Elbow  6.6   7.9  A Elbow B Elbow  71   A Elbow  8.0   8.0           Waveforms:             Clinical History: No specialty comments available.     Objective:  VS:  HT:    WT:   BMI:     BP:   HR: bpm  TEMP: ( )  RESP:  Physical Exam Musculoskeletal:        General: No swelling, tenderness or deformity.     Comments: Inspection reveals no atrophy of the bilateral APB or FDI or hand intrinsics. There is no swelling, color changes, allodynia or dystrophic changes. There is 5 out of 5 strength in the bilateral wrist extension, finger abduction and long finger flexion. There is intact sensation to light touch in all dermatomal and peripheral nerve  distributions.  There is a negative Hoffmann's test bilaterally.  Skin:    General: Skin is warm and dry.      Findings: No erythema or rash.  Neurological:     General: No focal deficit present.     Mental Status: She is alert and oriented to person, place, and time.     Motor: No weakness or abnormal muscle tone.     Coordination: Coordination normal.  Psychiatric:        Mood and Affect: Mood normal.        Behavior: Behavior normal.      Imaging: No results found.

## 2020-04-21 ENCOUNTER — Other Ambulatory Visit: Payer: Self-pay | Admitting: Family Medicine

## 2020-04-25 ENCOUNTER — Ambulatory Visit: Payer: BC Managed Care – PPO | Admitting: Physician Assistant

## 2020-04-29 ENCOUNTER — Other Ambulatory Visit: Payer: Self-pay

## 2020-04-29 ENCOUNTER — Encounter: Payer: Self-pay | Admitting: Emergency Medicine

## 2020-04-29 ENCOUNTER — Ambulatory Visit
Admission: EM | Admit: 2020-04-29 | Discharge: 2020-04-29 | Disposition: A | Discharge status: Home / Self Care | Payer: BC Managed Care – PPO | Attending: Emergency Medicine | Admitting: Emergency Medicine

## 2020-04-29 DIAGNOSIS — K219 Gastro-esophageal reflux disease without esophagitis: Secondary | ICD-10-CM

## 2020-04-29 DIAGNOSIS — R079 Chest pain, unspecified: Secondary | ICD-10-CM | POA: Diagnosis not present

## 2020-04-29 MED ORDER — FAMOTIDINE 20 MG PO TABS: 20.0000 mg | ORAL_TABLET | Freq: Two times a day (BID) | ORAL | 0 refills | Status: DC

## 2020-04-29 MED ORDER — OMEPRAZOLE 20 MG PO CPDR: 20.0000 mg | DELAYED_RELEASE_CAPSULE | Freq: Two times a day (BID) | ORAL | 0 refills | Status: DC

## 2020-04-29 MED ORDER — LIDOCAINE VISCOUS HCL 2 % MT SOLN
15.0000 mL | Freq: Once | OROMUCOSAL | Status: AC
  Administered 2020-04-29: 15 mL via ORAL

## 2020-04-29 MED ORDER — ALUM & MAG HYDROXIDE-SIMETH 200-200-20 MG/5ML PO SUSP
30.0000 mL | Freq: Once | ORAL | Status: AC
  Administered 2020-04-29: 30 mL via ORAL

## 2020-04-29 NOTE — Discharge Instructions (Signed)
EKG normal Begin omeprazole twice daily for prevention of underlying acid Supplement with Pepcid twice daily or may use over-the-counter Maalox Avoid trigger foods-see attached Follow-up if not improving or worsening

## 2020-04-29 NOTE — ED Triage Notes (Signed)
Patient c/o chest pain "burning" x 3 days.   Patient endorses burping a lot.   Patient endorses anxiety.   Patient has history of Acid Reflux. Patient hasn't taken GERD medication "in a while".

## 2020-04-29 NOTE — ED Provider Notes (Signed)
[facility]    CSN: [id number] Arrival date & time: 04/29/20  0915      History   Chief Complaint Chief Complaint  Patient presents with  . Chest Pain    HPI Makayla Good is a 50 y.o. female history of asthma presenting today for evaluation of chest discomfort.  Per reports that she has had chest discomfort described as a burning sensation for approximately 3 days.  Reports also a lot of belching and associated anxiety.  Symptoms slightly improved with drinking fluids.  Reports symptoms also worse later in the evening.  Previously was on GERD medicine, but has not taken this in a while.  Reports intermittent upper abdominal discomfort as well.  Has had some occasional brief episodes of dizziness, denies currently.  Denies any difficulty breathing or shortness of breath.  Does report history of hypertension, tobacco use.  Denies diabetes history.  HPI  Past Medical History:  Diagnosis Date  . Asthma   . Eczema   . Fibroid   . H/O oophorectomy   . Hypertension   . Obesity   . Polycystic ovary    Left  . Sleep apnea    no CPAP per MD    Patient Active Problem List   Diagnosis Date Noted  . Lower extremity edema 09/22/2019  . Lymphedema 09/22/2019  . Essential hypertension 08/05/2017  . Hypokalemia 08/05/2017  . OSA (obstructive sleep apnea) 03/05/2017  . Asthma 03/05/2017  . Dizziness 02/08/2017  . Preventative health care 02/08/2017  . Abdominal pain 07/05/2016  . Right upper quadrant abdominal pain 07/05/2016  . Irregular periods 07/26/2014  . Dysuria 02/04/2014  . Severe obesity (BMI >= 40) ([place]) 10/21/2012  . OA (osteoarthritis) of knee 06/04/2012  . Cough 03/05/2012  . Plantar fasciitis 09/04/2010  . HYPOKALEMIA 07/01/2009  . CHEST PAIN UNSPECIFIED 05/18/2009  . DEPRESSION 03/07/2009  . FATIGUE 03/07/2009  . TOBACCO USER 12/03/2008  . Morbid obesity ([place]) 05/21/2008  . Hyperlipidemia LDL goal <100 05/22/2007  . DYSMETABOLIC  SYNDROME X 93/81/0175  . CONJUNCTIVITIS NOS 02/04/2007  . VAGINITIS NOS 11/07/2006  . FUNGAL DERMATITIS 10/09/2006  . Benign essential HTN 10/09/2006    Past Surgical History:  Procedure Laterality Date  . BACK SURGERY     93yrs ago  . BREAST BIOPSY Right 2019   fibroadenoma  . CESAREAN SECTION  02/01/2005  . CESAREAN SECTION  2001  . LAPAROSCOPIC GASTRIC SLEEVE RESECTION N/A 07/29/2017   Procedure: LAPAROSCOPIC GASTRIC SLEEVE RESECTION WITH UPPER ENDO ;  Surgeon: [name], MD;  Location: [facility];  Service: General;  Laterality: N/A;  . OOPHORECTOMY     Rt.removed  . TUBAL LIGATION  02/01/2005    OB History    Gravida  5   Para  2   Term      Preterm      AB  3   Living  2     SAB  2   IAB  1   Ectopic      Multiple      Live Births               Home Medications    Prior to Admission medications   Medication Sig Start Date End Date Taking? Authorizing Provider  amLODipine (NORVASC) 2.5 MG tablet 1 po qd 09/22/19  Yes Lowne Lyndal Pulley R, DO  famotidine (PEPCID) 20 MG tablet Take 1 tablet (20 mg total) by mouth 2 (two) times daily. 04/29/20  Yes  Vilma Will C, PA-C  hydrochlorothiazide (HYDRODIURIL) 25 MG tablet 1 po qd 04/01/20  Yes Lowne Lyndal Pulley R, DO  omeprazole (PRILOSEC) 20 MG capsule Take 1 capsule (20 mg total) by mouth 2 (two) times daily before a meal. 04/29/20  Yes Taquita Demby C, PA-C  potassium chloride (KLOR-CON) 10 MEQ tablet Take 1 tablet (10 mEq total) by mouth daily. 04/25/20  Yes Roma Schanz R, DO  Biotin w/ Vitamins C & E (HAIR/SKIN/NAILS PO) Take 1 tablet by mouth daily.    [provider]  Multiple Vitamins-Minerals (CELEBRATE MULTI-COMPLETE 36 PO) Take 1 each by mouth daily.    [provider]  nicotine (NICODERM CQ - DOSED IN MG/24 HOURS) 14 mg/24hr patch Place 1 patch (14 mg total) onto the skin daily. 02/23/20   [name], DO  varenicline (CHANTIX STARTING MONTH PAK) 0.5 MG X 11  & 1 MG X 42 tablet Take one 0.5 mg tablet by mouth once daily for 3 days, then increase to one 0.5 mg tablet twice daily for 4 days, then increase to one 1 mg tablet twice daily. 02/16/20   [name], DO    Family History Family History  Problem Relation Age of Onset  . Hypertension Mother   . Schizophrenia Mother   . Dementia Mother   . Mental illness Mother        schizophrenia, dementia  . Hypertension Father   . Coronary artery disease Father        Stent  . Hypertension Sister   . Hypertension Sister   . Arthritis Sister        rheumatoid  . Dementia Maternal Aunt   . Kidney disease Other   . Colon cancer Neg Hx   . Stomach cancer Neg Hx   . Pancreatic cancer Neg Hx   . Esophageal cancer Neg Hx   . Rectal cancer Neg Hx     Social History Social History   Tobacco Use  . Smoking status: Current Every Day Smoker    Packs/day: 0.50    Years: 30.00    Pack years: 15.00    Types: Cigarettes    Start date: 03/23/1993  . Smokeless tobacco: Never Used  . Tobacco comment: will try chantix   Vaping Use  . Vaping Use: Former  Substance Use Topics  . Alcohol use: Yes    Alcohol/week: 1.0 standard drink    Types: 1 Glasses of wine per week    Comment: occasional  . Drug use: No     Allergies   Patient has no known allergies.   Review of Systems Review of Systems  Constitutional: Negative for fatigue and fever.  Eyes: Negative for visual disturbance.  Respiratory: Negative for shortness of breath.   Cardiovascular: Positive for chest pain.  Gastrointestinal: Positive for abdominal pain. Negative for nausea and vomiting.  Musculoskeletal: Negative for arthralgias and joint swelling.  Skin: Negative for color change, rash and wound.  Neurological: Negative for dizziness, weakness, light-headedness and headaches.     Physical Exam Triage Vital Signs ED Triage Vitals [04/29/20 0939]  Enc Vitals Group     BP (!) 163/98     Pulse Rate 82     Resp 18      Temp 98.2 F (36.8 C)     Temp Source Oral     SpO2 98 %     Weight 195 lb (88.5 kg)     Height 5\' 5"  (1.651 m)  Head Circumference      Peak Flow      Pain Score 4     Pain Loc      Pain Edu?      Excl. in [place]?    No data found.  Updated Vital Signs BP (!) 163/98 (BP Location: Left Arm)   Pulse 82   Temp 98.2 F (36.8 C) (Oral)   Resp 18   Ht 5\' 5"  (1.651 m)   Wt 195 lb (88.5 kg)   SpO2 98%   BMI 32.45 kg/m   Visual Acuity Right Eye Distance:   Left Eye Distance:   Bilateral Distance:    Right Eye Near:   Left Eye Near:    Bilateral Near:     Physical Exam Vitals and nursing note reviewed.  Constitutional:      Appearance: She is well-developed and well-nourished.     Comments: No acute distress  HENT:     Head: Normocephalic and atraumatic.     Nose: Nose normal.  Eyes:     Conjunctiva/sclera: Conjunctivae normal.  Cardiovascular:     Rate and Rhythm: Normal rate and regular rhythm.  Pulmonary:     Effort: Pulmonary effort is normal. No respiratory distress.     Comments: Breathing comfortably at rest, CTABL, no wheezing, rales or other adventitious sounds auscultated  No reproducible chest tenderness Abdominal:     General: There is no distension.     Comments: Soft, nondistended, nontender light to palpation throughout abdomen  Musculoskeletal:        General: Normal range of motion.     Cervical back: Neck supple.     Comments: Bilateral lower legs symmetric, no calf tenderness or swelling  Skin:    General: Skin is warm and dry.  Neurological:     Mental Status: She is alert and oriented to person, place, and time.  Psychiatric:        Mood and Affect: Mood and affect normal.      UC Treatments / Results  Labs (all labs ordered are listed, but only abnormal results are displayed) Labs Reviewed - No data to display  EKG   Radiology No results found.  Procedures Procedures (including critical care time)  Medications  Ordered in UC Medications  alum & mag hydroxide-simeth (MAALOX/MYLANTA) 200-200-20 MG/5ML suspension 30 mL (30 mLs Oral Given 04/29/20 1019)    And  lidocaine (XYLOCAINE) 2 % viscous mouth solution 15 mL (15 mLs Oral Given 04/29/20 1019)    Initial Impression / Assessment and Plan / UC Course  I have reviewed the triage vital signs and the nursing notes.  Pertinent labs & imaging results that were available during my care of the patient were reviewed by me and considered in my medical decision making (see chart for details).  Clinical Course as of 04/29/20 1103  Fri Apr 29, 2020  1018 140/93 left arm sitting [HW]    Clinical Course User Index [HW] Dontae Minerva C, PA-C    EKG normal sinus rhythm, no acute signs of ischemia or infarction.  GI cocktail provided with slight improvement of symptoms, suspect most likely underlying GERD/gastritis.  Initiated on Prilosec x2 weeks, Pepcid to supplement.  Avoid trigger foods.  Continue to monitor.  Discussed strict return precautions. Patient verbalized understanding and is agreeable with plan.  Final Clinical Impressions(s) / UC Diagnoses   Final diagnoses:  Gastroesophageal reflux disease, unspecified whether esophagitis present  Chest pain, unspecified type     Discharge Instructions  EKG normal Begin omeprazole twice daily for prevention of underlying acid Supplement with Pepcid twice daily or may use over-the-counter Maalox Avoid trigger foods-see attached Follow-up if not improving or worsening    ED Prescriptions    Medication Sig Dispense Auth. Provider   omeprazole (PRILOSEC) 20 MG capsule Take 1 capsule (20 mg total) by mouth 2 (two) times daily before a meal. 30 capsule Tola Meas C, PA-C   famotidine (PEPCID) 20 MG tablet Take 1 tablet (20 mg total) by mouth 2 (two) times daily. 30 tablet Mccayla Shimada, [place] C, PA-C     PDMP not reviewed this encounter.   Janith Lima, PA-C 04/29/20 1104

## 2020-05-05 ENCOUNTER — Encounter: Payer: Self-pay | Admitting: Family Medicine

## 2020-05-05 ENCOUNTER — Other Ambulatory Visit: Payer: Self-pay

## 2020-05-05 ENCOUNTER — Ambulatory Visit: Payer: BC Managed Care – PPO | Admitting: Family Medicine

## 2020-05-05 VITALS — BP 140/100 | HR 85 | Temp 99.1°F | Resp 18 | Ht 65.0 in | Wt 197.6 lb

## 2020-05-05 DIAGNOSIS — R1011 Right upper quadrant pain: Secondary | ICD-10-CM

## 2020-05-05 DIAGNOSIS — I10 Essential (primary) hypertension: Secondary | ICD-10-CM | POA: Diagnosis not present

## 2020-05-05 DIAGNOSIS — K219 Gastro-esophageal reflux disease without esophagitis: Secondary | ICD-10-CM

## 2020-05-05 DIAGNOSIS — R739 Hyperglycemia, unspecified: Secondary | ICD-10-CM | POA: Diagnosis not present

## 2020-05-05 LAB — COMPREHENSIVE METABOLIC PANEL
ALT: 25 U/L (ref 0–35)
AST: 21 U/L (ref 0–37)
Albumin: 4.4 g/dL (ref 3.5–5.2)
Alkaline Phosphatase: 70 U/L (ref 39–117)
BUN: 9 mg/dL (ref 6–23)
CO2: 29 mEq/L (ref 19–32)
Calcium: 9.6 mg/dL (ref 8.4–10.5)
Chloride: 103 mEq/L (ref 96–112)
Creatinine, Ser: 0.63 mg/dL (ref 0.40–1.20)
GFR: 104.12 mL/min (ref >60.00)
Glucose, Bld: 86 mg/dL (ref 70–99)
Potassium: 3.8 mEq/L (ref 3.5–5.1)
Sodium: 139 mEq/L (ref 135–145)
Total Bilirubin: 0.5 mg/dL (ref 0.2–1.2)
Total Protein: 7.1 g/dL (ref 6.0–8.3)

## 2020-05-05 LAB — CBC WITH DIFFERENTIAL/PLATELET
Basophils Absolute: 0.1 10*3/uL (ref 0.0–0.1)
Basophils Relative: 1.4 % (ref 0.0–3.0)
Eosinophils Absolute: 0 10*3/uL (ref 0.0–0.7)
Eosinophils Relative: 0.6 % (ref 0.0–5.0)
HCT: 42.1 % (ref 36.0–46.0)
Hemoglobin: 14.3 g/dL (ref 12.0–15.0)
Lymphocytes Relative: 53.7 % — ABNORMAL HIGH (ref 12.0–46.0)
Lymphs Abs: 1.9 10*3/uL (ref 0.7–4.0)
MCHC: 34 g/dL (ref 30.0–36.0)
MCV: 96.9 fl (ref 78.0–100.0)
Monocytes Absolute: 0.5 10*3/uL (ref 0.1–1.0)
Monocytes Relative: 13.1 % — ABNORMAL HIGH (ref 3.0–12.0)
Neutro Abs: 1.1 10*3/uL — ABNORMAL LOW (ref 1.4–7.7)
Neutrophils Relative %: 31.2 % — ABNORMAL LOW (ref 43.0–77.0)
Platelets: 162 10*3/uL (ref 150.0–400.0)
RBC: 4.34 Mil/uL (ref 3.87–5.11)
RDW: 13.1 % (ref 11.5–15.5)
WBC: 3.6 10*3/uL — ABNORMAL LOW (ref 4.0–10.5)

## 2020-05-05 LAB — H. PYLORI ANTIBODY, IGG: H Pylori IgG: NEGATIVE

## 2020-05-05 LAB — HEMOGLOBIN A1C: Hgb A1c MFr Bld: 5.6 % (ref 4.6–6.5)

## 2020-05-05 MED ORDER — AMLODIPINE BESYLATE 5 MG PO TABS: 5.0000 mg | ORAL_TABLET | Freq: Every day | ORAL | 3 refills | Status: DC

## 2020-05-05 NOTE — Assessment & Plan Note (Signed)
Poorly controlled will alter medications, encouraged DASH diet, minimize caffeine and obtain adequate sleep. Report concerning symptoms and follow up as directed and as needed 

## 2020-05-05 NOTE — Progress Notes (Signed)
Patient ID: Makayla Good, female    DOB: 04-22-1971  Age: 50 y.o. MRN: [id number]    Subjective:  Subjective  HPI Stan Head Barna presents for RUQ pain / chest pain --- [facility] and [facility]    Pt was told it was her gerd.  Pt is concerned about her GB---- inc pain with eating   prilosec is not really helping   Review of Systems  Constitutional: Negative for activity change, appetite change, fatigue and unexpected weight change.  Respiratory: Negative for cough and shortness of breath.   Cardiovascular: Positive for chest pain. Negative for palpitations.  Gastrointestinal: Positive for abdominal pain.  Psychiatric/Behavioral: Negative for behavioral problems and dysphoric mood. The patient is not nervous/anxious.     History Past Medical History:  Diagnosis Date  . Asthma   . Eczema   . Fibroid   . H/O oophorectomy   . Hypertension   . Obesity   . Polycystic ovary    Left  . Sleep apnea    no CPAP per MD    She has a past surgical history that includes Cesarean section (02/01/2005); Tubal ligation (02/01/2005); Cesarean section (2001); Laparoscopic gastric sleeve resection (N/A, 07/29/2017); Oophorectomy; Breast biopsy (Right, 2019); and Back surgery.   Her family history includes Arthritis in her sister; Coronary artery disease in her father; Dementia in her maternal aunt and mother; Hypertension in her father, mother, sister, and sister; Kidney disease in an other family member; Mental illness in her mother; Schizophrenia in her mother.She reports that she has quit smoking. Her smoking use included cigarettes. She started smoking 7 days ago. She has a 15.00 pack-year smoking history. She has never used smokeless tobacco. She reports current alcohol use of about 1.0 standard drink of alcohol per week. She reports that she does not use drugs.  Current Outpatient Medications on File Prior to Visit  Medication Sig Dispense Refill  . Biotin w/  Vitamins C & E (HAIR/SKIN/NAILS PO) Take 1 tablet by mouth daily.    . famotidine (PEPCID) 20 MG tablet Take 1 tablet (20 mg total) by mouth 2 (two) times daily. 30 tablet 0  . hydrochlorothiazide (HYDRODIURIL) 25 MG tablet 1 po qd 90 tablet 1  . Multiple Vitamins-Minerals (CELEBRATE MULTI-COMPLETE 36 PO) Take 1 each by mouth daily.    . nicotine (NICODERM CQ - DOSED IN MG/24 HOURS) 14 mg/24hr patch Place 1 patch (14 mg total) onto the skin daily. 28 patch 1  . omeprazole (PRILOSEC) 20 MG capsule Take 1 capsule (20 mg total) by mouth 2 (two) times daily before a meal. 30 capsule 0  . potassium chloride (KLOR-CON) 10 MEQ tablet Take 1 tablet (10 mEq total) by mouth daily. 90 tablet 1   No current facility-administered medications on file prior to visit.     Objective:  Objective  Physical Exam Vitals and nursing note reviewed.  Constitutional:      Appearance: She is well-developed and well-nourished.  HENT:     Head: Normocephalic and atraumatic.  Eyes:     Extraocular Movements: EOM normal.     Conjunctiva/sclera: Conjunctivae normal.  Neck:     Thyroid: No thyromegaly.     Vascular: No carotid bruit or JVD.  Cardiovascular:     Rate and Rhythm: Normal rate and regular rhythm.     Heart sounds: Normal heart sounds. No murmur heard.   Pulmonary:     Effort: Pulmonary effort is normal. No respiratory distress.  Breath sounds: Normal breath sounds. No wheezing or rales.  Chest:     Chest wall: No tenderness.  Abdominal:     Palpations: There is no mass.     Tenderness: There is abdominal tenderness in the right upper quadrant and epigastric area. There is no right CVA tenderness, left CVA tenderness, guarding or rebound.  Musculoskeletal:        General: No edema.     Cervical back: Normal range of motion and neck supple.  Neurological:     Mental Status: She is alert and oriented to person, place, and time.  Psychiatric:        Mood and Affect: Mood and affect normal.     BP (!) 140/100 (BP Location: Left Arm, Patient Position: Sitting, Cuff Size: Normal)   Pulse 85   Temp 99.1 F (37.3 C) (Oral)   Resp 18   Ht 5\' 5"  (1.651 m)   Wt 197 lb 9.6 oz (89.6 kg)   SpO2 99%   BMI 32.88 kg/m  Wt Readings from Last 3 Encounters:  05/05/20 197 lb 9.6 oz (89.6 kg)  04/29/20 195 lb (88.5 kg)  02/16/20 193 lb 9.6 oz (87.8 kg)     Lab Results  Component Value Date   WBC 5.8 02/13/2019   HGB 13.2 02/13/2019   HCT 39.3 02/13/2019   PLT 183.0 02/13/2019   GLUCOSE 105 (H) 09/22/2019   CHOL 178 09/22/2019   TRIG 112.0 09/22/2019   HDL 59.10 09/22/2019   LDLCALC 97 09/22/2019   ALT 18 09/22/2019   AST 19 09/22/2019   NA 139 09/22/2019   K 3.4 (L) 09/22/2019   CL 103 09/22/2019   CREATININE 0.77 09/22/2019   BUN 12 09/22/2019   CO2 29 09/22/2019   TSH 1.71 02/13/2019   HGBA1C 6.5 07/01/2015   MICROALBUR <0.7 01/23/2016    No results found.   Assessment & Plan:  Plan  I have discontinued Ayomide R. Stender's amLODipine and Chantix Starting Month Pak. I am also having her start on amLODipine. Additionally, I am having her maintain her Multiple Vitamins-Minerals (CELEBRATE MULTI-COMPLETE 36 PO), Biotin w/ Vitamins C & E (HAIR/SKIN/NAILS PO), nicotine, hydrochlorothiazide, potassium chloride, omeprazole, and famotidine.  Meds ordered this encounter  Medications  . amLODipine (NORVASC) 5 MG tablet    Sig: Take 1 tablet (5 mg total) by mouth daily.    Dispense:  90 tablet    Refill:  3    Problem List Items Addressed This Visit      Unprioritized   Gastroesophageal reflux disease   Relevant Orders   CBC with Differential/Platelet   Comprehensive metabolic panel   H. pylori antibody, IgG   Hyperglycemia    Check labs Watch simple sugars and starches      Relevant Orders   Hemoglobin A1c   Primary hypertension - Primary    Poorly controlled will alter medications, encouraged DASH diet, minimize caffeine and obtain adequate sleep.  Report concerning symptoms and follow up as directed and as needed      Relevant Medications   amLODipine (NORVASC) 5 MG tablet   Other Relevant Orders   CBC with Differential/Platelet   Comprehensive metabolic panel   H. pylori antibody, IgG   RUQ pain    Check labs Check [place] ? From gerd ---- take prilosec as rx by [facility] Consider GI      Relevant Orders   US Abdomen Limited RUQ (LIVER/GB)      Follow-up: Return in about 2  weeks (around 05/19/2020), or if symptoms worsen or fail to improve, for hypertension.  [name], DO

## 2020-05-05 NOTE — Patient Instructions (Signed)
Food Choices for Gastroesophageal Reflux Disease, Adult When you have gastroesophageal reflux disease (GERD), the foods you eat and your eating habits are very important. Choosing the right foods can help ease your discomfort. Think about working with a food expert (dietitian) to help you make good choices. What are tips for following this plan? Reading food labels  Look for foods that are low in saturated fat. Foods that may help with your symptoms include: ? Foods that have less than 5% of daily value (DV) of fat. ? Foods that have 0 grams of trans fat. Cooking  Do not fry your food.  Cook your food by baking, steaming, grilling, or broiling. These are all methods that do not need a lot of fat for cooking.  To add flavor, try to use herbs that are low in spice and acidity. Meal planning  Choose healthy foods that are low in fat, such as: ? Fruits and vegetables. ? Whole grains. ? Low-fat dairy products. ? Lean meats, fish, and poultry.  Eat small meals often instead of eating 3 large meals each day. Eat your meals slowly in a place where you are relaxed. Avoid bending over or lying down until 2-3 hours after eating.  Limit high-fat foods such as fatty meats or fried foods.  Limit your intake of fatty foods, such as oils, butter, and shortening.  Avoid the following as told by your doctor: ? Foods that cause symptoms. These may be different for different people. Keep a food diary to keep track of foods that cause symptoms. ? Alcohol. ? Drinking a lot of liquid with meals. ? Eating meals during the 2-3 hours before bed.   Lifestyle  Stay at a healthy weight. Ask your doctor what weight is healthy for you. If you need to lose weight, work with your doctor to do so safely.  Exercise for at least 30 minutes on 5 or more days each week, or as told by your doctor.  Wear loose-fitting clothes.  Do not smoke or use any products that contain nicotine or tobacco. If you need help  quitting, ask your doctor.  Sleep with the head of your bed higher than your feet. Use a wedge under the mattress or blocks under the bed frame to raise the head of the bed.  Chew sugar-free gum after meals. What foods should eat? Eat a healthy, well-balanced diet of fruits, vegetables, whole grains, low-fat dairy products, lean meats, fish, and poultry. Each person is different. Foods that may cause symptoms in one person may not cause any symptoms in another person. Work with your doctor to find foods that are safe for you. The items listed above may not be a complete list of what you can eat and drink. Contact a food expert for more options.   What foods should I avoid? Limiting some of these foods may help in managing the symptoms of GERD. Everyone is different. Talk with a food expert or your doctor to help you find the exact foods to avoid, if any. Fruits Any fruits prepared with added fat. Any fruits that cause symptoms. For some people, this may include citrus fruits, such as oranges, grapefruit, pineapple, and lemons. Vegetables Deep-fried vegetables. French fries. Any vegetables prepared with added fat. Any vegetables that cause symptoms. For some people, this may include tomatoes and tomato products, chili peppers, onions and garlic, and horseradish. Grains Pastries or quick breads with added fat. Meats and other proteins High-fat meats, such as fatty beef or pork,   hot dogs, ribs, ham, sausage, salami, and bacon. Fried meat or protein, including fried fish and fried chicken. Nuts and nut butters, in large amounts. Dairy Whole milk and chocolate milk. Sour cream. Cream. Ice cream. Cream cheese. Milkshakes. Fats and oils Butter. Margarine. Shortening. Ghee. Beverages Coffee and tea, with or without caffeine. Carbonated beverages. Sodas. Energy drinks. Fruit juice made with acidic fruits, such as orange or grapefruit. Tomato juice. Alcoholic drinks. Sweets and desserts Chocolate and  cocoa. Donuts. Seasonings and condiments Pepper. Peppermint and spearmint. Added salt. Any condiments, herbs, or seasonings that cause symptoms. For some people, this may include curry, hot sauce, or vinegar-based salad dressings. The items listed above may not be a complete list of what you should not eat and drink. Contact a food expert for more options. Questions to ask your doctor Diet and lifestyle changes are often the first steps that are taken to manage symptoms of GERD. If diet and lifestyle changes do not help, talk with your doctor about taking medicines. Where to find more information  International Foundation for Gastrointestinal Disorders: aboutgerd.org Summary  When you have GERD, food and lifestyle choices are very important in easing your symptoms.  Eat small meals often instead of 3 large meals a day. Eat your meals slowly and in a place where you are relaxed.  Avoid bending over or lying down until 2-3 hours after eating.  Limit high-fat foods such as fatty meats or fried foods. This information is not intended to replace advice given to you by your health care provider. Make sure you discuss any questions you have with your health care provider. Document Revised: 10/19/2019 Document Reviewed: 10/19/2019 Elsevier Patient Education  2021 Elsevier Inc.  

## 2020-05-05 NOTE — Assessment & Plan Note (Signed)
Check labs Check [place] ? From gerd ---- take prilosec as rx by [facility]

## 2020-05-05 NOTE — Assessment & Plan Note (Signed)
Check labs  Watch simple sugars and starches  

## 2020-05-06 ENCOUNTER — Ambulatory Visit (HOSPITAL_BASED_OUTPATIENT_CLINIC_OR_DEPARTMENT_OTHER)
Admission: RE | Admit: 2020-05-06 | Discharge: 2020-05-06 | Disposition: A | Discharge status: Home / Self Care | Payer: BC Managed Care – PPO | Source: Ambulatory Visit | Attending: Family Medicine | Admitting: Family Medicine

## 2020-05-06 DIAGNOSIS — R109 Unspecified abdominal pain: Secondary | ICD-10-CM | POA: Diagnosis not present

## 2020-05-06 DIAGNOSIS — R1011 Right upper quadrant pain: Secondary | ICD-10-CM

## 2020-07-05 ENCOUNTER — Encounter: Payer: Self-pay | Admitting: Medical

## 2020-07-05 ENCOUNTER — Other Ambulatory Visit: Payer: Self-pay

## 2020-07-05 ENCOUNTER — Ambulatory Visit: Payer: BC Managed Care – PPO | Admitting: Medical

## 2020-07-05 VITALS — BP 125/70 | HR 88 | Temp 98.1°F | Resp 18 | Ht 65.0 in | Wt 202.0 lb

## 2020-07-05 DIAGNOSIS — H814 Vertigo of central origin: Secondary | ICD-10-CM | POA: Diagnosis not present

## 2020-07-05 DIAGNOSIS — R519 Headache, unspecified: Secondary | ICD-10-CM | POA: Diagnosis not present

## 2020-07-05 DIAGNOSIS — F439 Reaction to severe stress, unspecified: Secondary | ICD-10-CM | POA: Diagnosis not present

## 2020-07-05 DIAGNOSIS — R42 Dizziness and giddiness: Secondary | ICD-10-CM | POA: Diagnosis not present

## 2020-07-05 DIAGNOSIS — I10 Essential (primary) hypertension: Secondary | ICD-10-CM

## 2020-07-05 LAB — CBC WITH DIFFERENTIAL/PLATELET
Absolute Monocytes: 496 cells/uL (ref 200–950)
Basophils Absolute: 40 cells/uL (ref 0–200)
Basophils Relative: 0.7 %
Eosinophils Absolute: 91 cells/uL (ref 15–500)
Eosinophils Relative: 1.6 %
HCT: 40 % (ref 35.0–45.0)
Hemoglobin: 13.8 g/dL (ref 11.7–15.5)
Lymphs Abs: 2696 cells/uL (ref 850–3900)
MCH: 33.2 pg — ABNORMAL HIGH (ref 27.0–33.0)
MCHC: 34.5 g/dL (ref 32.0–36.0)
MCV: 96.2 fL (ref 80.0–100.0)
MPV: 11.9 fL (ref 7.5–12.5)
Monocytes Relative: 8.7 %
Neutro Abs: 2377 cells/uL (ref 1500–7800)
Neutrophils Relative %: 41.7 %
Platelets: 204 10*3/uL (ref 140–400)
RBC: 4.16 10*6/uL (ref 3.80–5.10)
RDW: 12.7 % (ref 11.0–15.0)
Total Lymphocyte: 47.3 %
WBC: 5.7 10*3/uL (ref 3.8–10.8)

## 2020-07-05 LAB — COMPREHENSIVE METABOLIC PANEL
AG Ratio: 1.5 (calc) (ref 1.0–2.5)
ALT: 20 U/L (ref 6–29)
AST: 19 U/L (ref 10–35)
Albumin: 4.5 g/dL (ref 3.6–5.1)
Alkaline phosphatase (APISO): 67 U/L (ref 31–125)
BUN: 17 mg/dL (ref 7–25)
CO2: 28 mmol/L (ref 20–32)
Calcium: 10.3 mg/dL — ABNORMAL HIGH (ref 8.6–10.2)
Chloride: 103 mmol/L (ref 98–110)
Creat: 0.66 mg/dL (ref 0.50–1.10)
Globulin: 3 g/dL (calc) (ref 1.9–3.7)
Glucose, Bld: 87 mg/dL (ref 65–99)
Potassium: 3.8 mmol/L (ref 3.5–5.3)
Sodium: 140 mmol/L (ref 135–146)
Total Bilirubin: 0.5 mg/dL (ref 0.2–1.2)
Total Protein: 7.5 g/dL (ref 6.1–8.1)

## 2020-07-05 MED ORDER — MECLIZINE HCL 12.5 MG PO TABS: 12.5000 mg | ORAL_TABLET | Freq: Three times a day (TID) | ORAL | 0 refills | Status: DC | PRN

## 2020-07-05 NOTE — Progress Notes (Signed)
Subjective:    Patient ID: Makayla Good, female    DOB: 07/20/1970, 50 y.o.   MRN: [id number]  HPI  Pt in for some recent dizziness since Friday. She states dizziness has been coming and going. On Friday she got out of her car and almost lost her balance. She states walking down hall random. She states sometimes when turns her head. Pt states when going from sitting to standing will feel light headed briefly. Usually occurs for seconds.  Pt has automatic bp machine at home. One time over the weekend was 140/101 and other time slight elevated. Pt thinks new machine. Also she will make sure fresh batteries.   Pt states recent a lot of stress related to husband health. He had stroke and surgery for heart valve.   Pt states will get dizzy 2-3 times a day. No dizziness lying in bed and turning head.  Pt states hx of ha in past. But none with recent dizziness.   2 weeks ago was having daily ha. Non nauseau, no vomiting or light sensitivity. She had consective ha for one week which is unusual for her.     Review of Systems  Constitutional: Negative for chills, fatigue and fever.  Respiratory: Negative for cough, chest tightness, shortness of breath and wheezing.   Cardiovascular: Negative for chest pain and palpitations.  Gastrointestinal: Negative for abdominal distention, abdominal pain, blood in stool and constipation.  Genitourinary: Negative for dysuria.  Musculoskeletal: Negative for back pain and myalgias.  Skin: Negative for rash.  Neurological: Positive for dizziness and headaches. Negative for seizures, syncope, speech difficulty, weakness and numbness.       See hpi. Dizziness most recnt about 2 hours ago lasting about 10 minutes.  Psychiatric/Behavioral: Negative for behavioral problems, dysphoric mood, hallucinations, sleep disturbance and suicidal ideas. The patient is not nervous/anxious.        Recent high level stress.   Past Medical History:  Diagnosis  Date  . Asthma   . Eczema   . Fibroid   . H/O oophorectomy   . Hypertension   . Obesity   . Polycystic ovary    Left  . Sleep apnea    no CPAP per MD     Social History   Socioeconomic History  . Marital status: Married    Spouse name: Not on file  . Number of children: Not on file  . Years of education: 70  . Highest education level: Not on file  Occupational History  . Occupation: release Horticulturist, commercial: [employer]  Tobacco Use  . Smoking status: Former Smoker    Packs/day: 0.50    Years: 30.00    Pack years: 15.00    Types: Cigarettes    Start date: 04/28/2020  . Smokeless tobacco: Never Used  . Tobacco comment: will try chantix   Vaping Use  . Vaping Use: Former  Substance and Sexual Activity  . Alcohol use: Yes    Alcohol/week: 1.0 standard drink    Types: 1 Glasses of wine per week    Comment: occasional  . Drug use: No  . Sexual activity: Yes    Partners: Male    Birth control/protection: Surgical    Comment: Tubal/been with same person over 20 years  Other Topics Concern  . Not on file  Social History Narrative   Exercise--gym, every other day--- at least 3 days a week   Social Determinants of Health   Financial Resource Strain:  Not on file  Food Insecurity: Not on file  Transportation Needs: Not on file  Physical Activity: Not on file  Stress: Not on file  Social Connections: Not on file  Intimate Partner Violence: Not on file    Past Surgical History:  Procedure Laterality Date  . BACK SURGERY     80yrs ago  . BREAST BIOPSY Right 2019   fibroadenoma  . CESAREAN SECTION  02/01/2005  . CESAREAN SECTION  2001  . LAPAROSCOPIC GASTRIC SLEEVE RESECTION N/A 07/29/2017   Procedure: LAPAROSCOPIC GASTRIC SLEEVE RESECTION WITH UPPER ENDO ;  Surgeon: [name], MD;  Location: [facility];  Service: General;  Laterality: N/A;  . OOPHORECTOMY     Rt.removed  . TUBAL LIGATION  02/01/2005    Family History  Problem Relation Age of Onset   . Hypertension Mother   . Schizophrenia Mother   . Dementia Mother   . Mental illness Mother        schizophrenia, dementia  . Hypertension Father   . Coronary artery disease Father        Stent  . Hypertension Sister   . Hypertension Sister   . Arthritis Sister        rheumatoid  . Dementia Maternal Aunt   . Kidney disease Other   . Colon cancer Neg Hx   . Stomach cancer Neg Hx   . Pancreatic cancer Neg Hx   . Esophageal cancer Neg Hx   . Rectal cancer Neg Hx     No Known Allergies  Current Outpatient Medications on File Prior to Visit  Medication Sig Dispense Refill  . amLODipine (NORVASC) 5 MG tablet Take 1 tablet (5 mg total) by mouth daily. 90 tablet 3  . Biotin w/ Vitamins C & E (HAIR/SKIN/NAILS PO) Take 1 tablet by mouth daily.    . famotidine (PEPCID) 20 MG tablet Take 1 tablet (20 mg total) by mouth 2 (two) times daily. 30 tablet 0  . hydrochlorothiazide (HYDRODIURIL) 25 MG tablet 1 po qd 90 tablet 1  . Multiple Vitamins-Minerals (CELEBRATE MULTI-COMPLETE 36 PO) Take 1 each by mouth daily.    . nicotine (NICODERM CQ - DOSED IN MG/24 HOURS) 14 mg/24hr patch Place 1 patch (14 mg total) onto the skin daily. 28 patch 1  . omeprazole (PRILOSEC) 20 MG capsule Take 1 capsule (20 mg total) by mouth 2 (two) times daily before a meal. 30 capsule 0  . potassium chloride (KLOR-CON) 10 MEQ tablet Take 1 tablet (10 mEq total) by mouth daily. 90 tablet 1   No current facility-administered medications on file prior to visit.    BP 125/70   Pulse 88   Temp 98.1 F (36.7 C)   Resp 18   Ht 5\' 5"  (1.651 m)   Wt 202 lb (91.6 kg)   SpO2 100%   BMI 33.61 kg/m       Objective:   Physical Exam  General Mental Status- Alert. General Appearance- Not in acute distress.   Skin General: Color- Normal Color. Moisture- Normal Moisture.  Neck Carotid Arteries- Normal color. Moisture- Normal Moisture. No carotid bruits. No JVD. No trapezius tenderness.   Chest and Lung  Exam Auscultation: Breath Sounds:-Normal.  Cardiovascular Auscultation:Rythm- Regular. Murmurs & Other Heart Sounds:Auscultation of the heart reveals- No Murmurs.  Abdomen Inspection:-Inspeection Normal. Palpation/Percussion:Note:No mass. Palpation and Percussion of the abdomen reveal- Non Tender, Non Distended + BS, no rebound or guarding.   Neurologic Cranial Nerve exam:- CN III-XII intact(No nystagmus), symmetric smile.  Drift Test:- No drift. Romberg Exam:- negative. Heal to Toe Gait exam:-overall good for 3-4 steps stumble little then finished with no problem after additions 3-4 steps. Finger to Nose:- Normal/Intact Strength:- 5/5 equal and symmetric strength both upper and lower extremities.      Assessment & Plan:  Recent dizziness intermittent for the past 4 days.  Also reports some headache 2 weeks ago which was uncharacteristic for you.  Decided to get EKG today to evaluate cardiac cause.  EKG showed normal sinus rhythm.  Also will go ahead and do CT of head without contrast.  No precertification needed per staff.  Go down now to radiology to get scheduled.  Prefer the study be done today.  At the very latest tomorrow morning.  You have history of high blood pressure but your blood pressure is well controlled today.  Recommend that you check your blood pressure when you feel dizzy.  You could do 3 consecutive readings 5 minutes apart to get accurate reading.  Recent high-level stress that appears associated with husband's health.  If you feel like having daily chronic anxiety then could offer medication called BuSpar.  We will get CBC and CMP to see if laboratory cobblestone that might contribute to her recent dizziness.  We will make meclizine available to use if you have dizziness lasting more than 5 minutes.  But if less frequent/transient then do not recommend use of meclizine.  Presently if you do get headache would recommend Tylenol.  If any dizziness with gross  motor or sensory function deficits then recommend ED evaluation.  Follow-up in 7 to 10 days or as needed.  Time spent with patient today was 40  minutes which consisted of chart review, discussing diagnoses, work up, treatment and documentation.

## 2020-07-05 NOTE — Patient Instructions (Addendum)
Recent dizziness intermittent for the past 4 days.  Also reports some headache 2 weeks ago which was uncharacteristic for you.  Decided to get EKG today to evaluate cardiac cause.  EKG showed normal sinus rhythm.  Also will go ahead and do CT of head without contrast.  No precertification needed per staff.  Go down now to radiology to get scheduled.  Prefer the study be done today.  At the very latest tomorrow morning.  You have history of high blood pressure but your blood pressure is well controlled today.  Recommend that you check your blood pressure when you feel dizzy.  You could do 3 consecutive readings 5 minutes apart to get accurate reading.  Recent high-level stress that appears associated with husband's health.  If you feel like having daily chronic anxiety then could offer medication called BuSpar.  We will get CBC and CMP to see if laboratory cobblestone that might contribute to her recent dizziness.  We will make meclizine available to use if you have dizziness lasting more than 5 minutes.  But if less frequent/transient then do not recommend use of meclizine.  Presently if you do get headache would recommend Tylenol.  If any dizziness with gross motor or sensory function deficits then recommend ED evaluation.  Follow-up in 7 to 10 days or as needed.

## 2020-07-06 ENCOUNTER — Telehealth: Payer: Self-pay

## 2020-07-06 NOTE — Telephone Encounter (Signed)
Nurse Assessment Nurse: Sheppard Plumber, RN, Estill Bamberg Date/Time (Eastern Time): 07/05/2020 2:50:32 PM Confirm and document reason for call. If symptomatic, describe symptoms. ---caller states she is dizzy and has been on and off since last Friday and now it is every day. no other symptoms. has been lightheaded. bp meds just increased. sat bp was elevated Does the patient have any new or worsening symptoms? ---Yes Will a triage be completed? ---Yes Related visit to physician within the last 2 weeks? ---Yes Does the PT have any chronic conditions? (i.e. diabetes, asthma, this includes High risk factors for pregnancy, etc.) ---Yes List chronic conditions. ---HTN on amlodipine was 2.5mg  and now 5mg  Is the patient pregnant or possibly pregnant? (Ask all females between the ages of 78-55) ---No Is this a behavioral health or substance abuse call? ---No Guidelines Guideline Title Affirmed Question Affirmed Notes Nurse Date/Time (Eastern Time) Dizziness - Lightheadedness [1] MODERATE dizziness (e.g., interferes with normal activities) AND [2] has NOT been evaluated by physician for this (Exception: dizziness caused by heat exposure, sudden standing, or poor fluid intake) Humfleet, RN, Estill Bamberg 07/05/2020 2:52:02 PM Disp. Time Eilene Ghazi Time) Disposition Final User PLEASE NOTE: All timestamps contained within this report are represented as [place] Standard Time. CONFIDENTIALTY NOTICE: This fax transmission is intended only for the addressee. It contains information that is legally privileged, confidential or otherwise protected from use or disclosure. If you are not the intended recipient, you are strictly prohibited from reviewing, disclosing, copying using or disseminating any of this information or taking any action in reliance on or regarding this information. If you have received this fax in error, please notify us immediately by telephone so that we can arrange for its return to [place]. Phone:  [phone], Toll-Free: [phone], Fax: [phone] Page: 2 of 2 Call Id: [id number] 07/05/2020 3:02:07 PM See PCP within 24 Hours Yes Humfleet, RN, Shelly Coss Disagree/Comply Comply Caller Understands Yes PreDisposition Did not know what to do Care Advice Given Per Guideline CARE ADVICE given per Dizziness (Adult) guideline. * You become worse CALL BACK IF: * Passes out (faints) SEE PCP WITHIN 24 HOURS: * IF OFFICE WILL BE OPEN: You need to be examined within the next 24 hours. Call your doctor (or NP/ PA) when the office opens and make an appointment. Comments User: Rozelle Logan, RN Date/Time Eilene Ghazi Time): 07/05/2020 2:54:17 PM states someone from pcp office is calling her now. Referrals REFERRED TO PCP OFFICE Warm transfer to backline  Pt seen by Percell Miller yesterday, 07/05/20

## 2020-07-07 ENCOUNTER — Telehealth: Payer: Self-pay | Admitting: Medical

## 2020-07-07 ENCOUNTER — Other Ambulatory Visit: Payer: Self-pay

## 2020-07-07 ENCOUNTER — Ambulatory Visit (HOSPITAL_BASED_OUTPATIENT_CLINIC_OR_DEPARTMENT_OTHER)
Admission: RE | Admit: 2020-07-07 | Discharge: 2020-07-07 | Disposition: A | Discharge status: Home / Self Care | Payer: BC Managed Care – PPO | Source: Ambulatory Visit | Attending: Medical | Admitting: Medical

## 2020-07-07 DIAGNOSIS — R519 Headache, unspecified: Secondary | ICD-10-CM

## 2020-07-07 DIAGNOSIS — H814 Vertigo of central origin: Secondary | ICD-10-CM | POA: Diagnosis not present

## 2020-07-07 DIAGNOSIS — R42 Dizziness and giddiness: Secondary | ICD-10-CM | POA: Diagnosis not present

## 2020-07-07 DIAGNOSIS — E236 Other disorders of pituitary gland: Secondary | ICD-10-CM

## 2020-07-07 NOTE — Telephone Encounter (Signed)
Referral to neurologist placed. 

## 2020-07-11 ENCOUNTER — Ambulatory Visit: Payer: BC Managed Care – PPO | Admitting: Family Medicine

## 2020-07-14 ENCOUNTER — Institutional Professional Consult (permissible substitution): Payer: BC Managed Care – PPO | Admitting: Neurology

## 2020-08-04 ENCOUNTER — Institutional Professional Consult (permissible substitution): Payer: BC Managed Care – PPO | Admitting: Neurology

## 2020-08-31 ENCOUNTER — Other Ambulatory Visit: Payer: Self-pay | Admitting: Family Medicine

## 2020-08-31 DIAGNOSIS — Z1231 Encounter for screening mammogram for malignant neoplasm of breast: Secondary | ICD-10-CM

## 2020-09-02 DIAGNOSIS — R1011 Right upper quadrant pain: Secondary | ICD-10-CM | POA: Diagnosis not present

## 2020-09-05 ENCOUNTER — Telehealth: Payer: Self-pay | Admitting: Neurology

## 2020-09-05 ENCOUNTER — Encounter: Payer: Self-pay | Admitting: Neurology

## 2020-09-05 ENCOUNTER — Ambulatory Visit: Payer: BC Managed Care – PPO | Admitting: Neurology

## 2020-09-05 ENCOUNTER — Other Ambulatory Visit: Payer: Self-pay

## 2020-09-05 VITALS — BP 144/97 | HR 79 | Ht 65.0 in | Wt 202.0 lb

## 2020-09-05 DIAGNOSIS — R930 Abnormal findings on diagnostic imaging of skull and head, not elsewhere classified: Secondary | ICD-10-CM | POA: Insufficient documentation

## 2020-09-05 DIAGNOSIS — R519 Headache, unspecified: Secondary | ICD-10-CM | POA: Diagnosis not present

## 2020-09-05 DIAGNOSIS — E8881 Metabolic syndrome: Secondary | ICD-10-CM | POA: Diagnosis not present

## 2020-09-05 DIAGNOSIS — F17200 Nicotine dependence, unspecified, uncomplicated: Secondary | ICD-10-CM

## 2020-09-05 DIAGNOSIS — R059 Cough, unspecified: Secondary | ICD-10-CM

## 2020-09-05 DIAGNOSIS — I10 Essential (primary) hypertension: Secondary | ICD-10-CM

## 2020-09-05 DIAGNOSIS — R600 Localized edema: Secondary | ICD-10-CM

## 2020-09-05 DIAGNOSIS — R0683 Snoring: Secondary | ICD-10-CM | POA: Insufficient documentation

## 2020-09-05 DIAGNOSIS — G4733 Obstructive sleep apnea (adult) (pediatric): Secondary | ICD-10-CM

## 2020-09-05 MED ORDER — POTASSIUM CHLORIDE ER 10 MEQ PO TBCR: 10.0000 meq | EXTENDED_RELEASE_TABLET | Freq: Every day | ORAL | 1 refills | Status: DC

## 2020-09-05 NOTE — Telephone Encounter (Signed)
BC[employer] auth NPR sent to GI

## 2020-09-05 NOTE — Progress Notes (Signed)
50 y.o. [id number] Married [place] American female here for annual exam.    Patient went almost a year without menses then had one in 07/2020. Prior LMP was May, 2021. Not having a lot of hot flashes but does have periods of feeling warm.   Husband had a stroke and is improving.   PCP:  [name], DO   Patient's last menstrual period was 08/09/2020 (exact date).     Period Cycle (Days):  (no menses in 1 year until 07/2020)     Sexually active: No. husband w/stroke The current method of family planning is tubal ligation.    Exercising: Yes.    walking Smoker:  Former  Health Maintenance: Pap:  05-22-18 Neg:Neg HR HPV, 01-23-16 Neg:Neg HR HPV, 10-21-12 Neg:Neg HR HPV History of abnormal Pap:  no MMG: 12-07-19 3D/Neg/BiRads1 Colonoscopy: 04-08-19 1 benign polyp;next 10 yrs. BMD:   n/a  Result  n/a TDaP:  PCP Gardasil:   no HIV: 06-13-17 NR Hep C: Unsure Screening Labs:  PCP.    reports that she has quit smoking. Her smoking use included cigarettes. She started smoking about 4 months ago. She has a 15.00 pack-year smoking history. She has never used smokeless tobacco. She reports previous alcohol use. She reports that she does not use drugs.  Past Medical History:  Diagnosis Date  . Asthma   . Eczema   . Fibroid   . H/O oophorectomy   . Hypertension   . Obesity   . Polycystic ovary    Left  . Sleep apnea    no CPAP per MD    Past Surgical History:  Procedure Laterality Date  . BACK SURGERY     27yrs ago  . BREAST BIOPSY Right 2019   fibroadenoma  . CESAREAN SECTION  02/01/2005  . CESAREAN SECTION  2001  . LAPAROSCOPIC GASTRIC SLEEVE RESECTION N/A 07/29/2017   Procedure: LAPAROSCOPIC GASTRIC SLEEVE RESECTION WITH UPPER ENDO ;  Surgeon: [name], MD;  Location: [facility];  Service: General;  Laterality: N/A;  . OOPHORECTOMY     Rt.removed  . TUBAL LIGATION  02/01/2005    Current Outpatient Medications  Medication Sig Dispense Refill  . amLODipine (NORVASC) 5 MG  tablet Take 1 tablet (5 mg total) by mouth daily. 90 tablet 3  . ASHWAGANDHA PO Take 1 tablet by mouth daily.    . Biotin w/ Vitamins C & E (HAIR/SKIN/NAILS PO) Take 1 tablet by mouth daily.    . famotidine (PEPCID) 20 MG tablet Take 1 tablet (20 mg total) by mouth 2 (two) times daily. 30 tablet 0  . hydrochlorothiazide (HYDRODIURIL) 25 MG tablet 1 po qd 90 tablet 1  . meclizine (ANTIVERT) 12.5 MG tablet Take 1 tablet (12.5 mg total) by mouth 3 (three) times daily as needed for dizziness. 30 tablet 0  . Multiple Vitamins-Minerals (CELEBRATE MULTI-COMPLETE 36 PO) Take 1 each by mouth daily.    . nicotine (NICODERM CQ - DOSED IN MG/24 HOURS) 14 mg/24hr patch Place 1 patch (14 mg total) onto the skin daily. 28 patch 1  . omeprazole (PRILOSEC) 20 MG capsule Take 1 capsule (20 mg total) by mouth 2 (two) times daily before a meal. 30 capsule 0  . potassium chloride (KLOR-CON) 10 MEQ tablet Take 1 tablet (10 mEq total) by mouth daily. 90 tablet 1   No current facility-administered medications for this visit.    Family History  Problem Relation Age of Onset  . Hypertension Mother   . Schizophrenia Mother   .  Dementia Mother   . Mental illness Mother        schizophrenia, dementia  . Hypertension Father   . Coronary artery disease Father        Stent  . Alzheimer's disease Father   . Hypertension Sister   . Hypertension Sister   . Arthritis Sister        rheumatoid  . Dementia Maternal Aunt   . Kidney disease Other   . Colon cancer Neg Hx   . Stomach cancer Neg Hx   . Pancreatic cancer Neg Hx   . Esophageal cancer Neg Hx   . Rectal cancer Neg Hx     Review of Systems  Genitourinary: Positive for pelvic pain (occ. RLQ pain).  All other systems reviewed and are negative. RLQ pain has resolved.  Takes an occasional laxative.   Exam:   BP 122/82 (Cuff Size: Large)   Pulse 81   Ht 5\' 5"  (1.651 m)   Wt 202 lb (91.6 kg)   LMP 08/09/2020 (Exact Date)   SpO2 100%   BMI 33.61 kg/m      General appearance: alert, cooperative and appears stated age Head: normocephalic, without obvious abnormality, atraumatic Neck: no adenopathy, supple, symmetrical, trachea midline and thyroid normal to inspection and palpation Lungs: clear to auscultation bilaterally Breasts: normal appearance, no masses or tenderness, No nipple retraction or dimpling, No nipple discharge or bleeding, No axillary adenopathy Heart: regular rate and rhythm Abdomen: soft, non-tender; no masses, no organomegaly Extremities: extremities normal, atraumatic, no cyanosis or edema Skin: skin color, texture, turgor normal. No rashes or lesions Lymph nodes: cervical, supraclavicular, and axillary nodes normal. Neurologic: grossly normal  Pelvic: External genitalia:  no lesions              No abnormal inguinal nodes palpated.              Urethra:  normal appearing urethra with no masses, tenderness or lesions              Bartholins and Skenes: normal                 Vagina: normal appearing vagina with normal color and discharge, no lesions              Cervix: no lesions              Pap taken: No. Bimanual Exam:  Uterus:  normal size, contour, position, consistency, mobility, non-tender              Adnexa: no mass, fullness, tenderness              Rectal exam: Yes.  .  Confirms.              Anus:  normal sphincter tone, no lesions  Chaperone was present for exam.  Assessment:   Well woman visit with normal exam. Perimenopausal female.  Status post right oophorectomy.  Status post gastric sleeve surgery.  Status post BTL.  RLQ pain. Uncertain etiology.  Recent menses.   Plan: Mammogram screening discussed. Self breast awareness reviewed. Pap and HR HPV as above. Guidelines for Calcium, Vitamin D, regular exercise program including cardiovascular and weight bearing exercise. Patient will monitor RLQ pain.  If recurs, she will contact office and I will order a pelvic US if appropriate.  Follow up  annually and prn.

## 2020-09-05 NOTE — Progress Notes (Signed)
SLEEP MEDICINE CLINIC    Provider:  Larey Seat, MD  Primary Care Physician:  [name], DO [address] [place] [address]     Referring Provider: Claudette Laws [address],  [place] [address]          Chief Complaint according to patient   Patient presents with:    . New Patient (Initial Visit)     Pt alone, rm 10. Feb she had started to develop headaches daily. She states a lot of stressful events occurring at that time. This hurt her in the left temple and top of the head. In march she had a CT of head. The last couple weeks she states the headaches have eased up a lot. She states she may have 1 a week if that. Much improved from when this initially occurred. She is still smoking, cut down over the last months , but has not quit yet.       HISTORY OF PRESENT ILLNESS:  Makayla Good is a 50 y.o. Black or [place] American female and right handed patient, seen here as a referral on 09/05/2020 from Dr Etter Sjogren. She reports 3 proplems, Vertigo, lightheadedness with some movements, and headaches in the top of the the head, tension, throbbing and tense , mostly in the left temple.  Headaches used to wake her- she used to be a CPAP user but lost weight - enough to quit. She takes melatonin to sleep,but the last month was difficult. 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   Chief concern according to patient :    I have the pleasure of seeing Makayla Good today, a right-handed [place] American female with a possible sleep disorder.  She has a  has a past medical history of allergic Asthma ( no medication used) ,  Eczema, Fibroid, H/O oophorectomy, Hypertension, Obesity, Polycystic ovary ( 2 living children- one ovary was removed) , and Obesity hypoventilation- over 30 pounds at the  time ) with O Sleep apnea. No syncope , no falls, no emesis.   Headache free days : 6 out of 7 days. No vision changes, but floaters. .     The patient had the first sleep study in the year 2010  with a result of an AHI ( Apnea Hypopnea index).  Sleep relevant medical history: Nocturia/ Enuresis , empty sella. Non intractable headache, unspecified chronicity pattern, unspecified headache type. Vertigo of central origin. GERD.  Vertigo, central; headache, chronic, new features or increased frequency; 2 weeks ago headache every day. Unusual for her. Recent about 4 days on and off dizziness.  Family medical /sleep history:  one sister has vertigo, anoother has migraines. 2 sisters on CPAP with OSA, insomnia.   Social history: Patient is working as a Sales promotion account executive and lives in a household with 3 persons, spouse and youngest son.  Pets are not present. Tobacco use- 1-2 cigarettes a week .   ETOH use; rarely , Caffeine intake in form of Coffee( 50% decaff, 2 cups a day) no energy drinks. Regular exercise in form of walking.     Sleep habits are as follows: Husband had a stroke 6 month ago- her lifestyle changes as a caretaker  no ,longer on CPAP. 10.30 bedtime. Insomnia to stay asleep.  Review of Systems: Out of a complete 14 system review, the patient complains of only the following symptoms, and all other reviewed systems are negative.:  Fatigue, sleepiness , snoring, fragmented sleep, Insomnia , headaches.    How likely are you to doze in the following situations: 0 = not likely, 1 = slight chance, 2 = moderate chance, 3 = high chance   Sitting and Reading? Watching Television? Sitting inactive in a public place (theater or meeting)? As a passenger in a car for an hour without a break? Lying down in the afternoon when circumstances permit? Sitting and talking to someone? Sitting quietly after lunch without alcohol? In a car, while stopped for a few minutes in traffic?   Total = 7/  24 points     Social History   Socioeconomic History  . Marital status: Married    Spouse name: Not on file  . Number of children: Not on file  . Years of education: 81  . Highest education level: Not on file  Occupational History  . Occupation: release Horticulturist, commercial: [employer]  Tobacco Use  . Smoking status: Former Smoker    Packs/day: 0.50    Years: 30.00    Pack years: 15.00    Types: Cigarettes    Start date: 04/28/2020  . Smokeless tobacco: Never Used  . Tobacco comment: will try chantix   Vaping Use  . Vaping Use: Former  Substance and Sexual Activity  . Alcohol use: Yes    Alcohol/week: 1.0 standard drink    Types: 1 Glasses of wine per week    Comment: occasional  . Drug use: No  . Sexual activity: Yes    Partners: Male    Birth control/protection: Surgical    Comment: Tubal/been with same person over 20 years  Other Topics Concern  . Not on file  Social History Narrative   Exercise--gym, every other day--- at least 3 days a week   Social Determinants of Health   Financial Resource Strain: Not on file  Food Insecurity: Not on file  Transportation Needs: Not on file  Physical Activity: Not on file  Stress: Not on file  Social Connections: Not on file    Family History  Problem Relation Age of Onset  . Hypertension Mother   . Schizophrenia Mother   . Dementia Mother   . Mental illness Mother        schizophrenia, dementia  . Hypertension Father   . Coronary artery disease Father        Stent  . Hypertension Sister   . Hypertension Sister   . Arthritis Sister        rheumatoid  . Dementia Maternal Aunt   . Kidney disease Other   . Colon cancer Neg Hx   . Stomach cancer Neg Hx   . Pancreatic cancer Neg Hx   . Esophageal cancer Neg Hx   . Rectal cancer Neg Hx     Past Medical History:  Diagnosis Date  . Asthma   . Eczema   . Fibroid   . H/O oophorectomy   . Hypertension   . Obesity   . Polycystic ovary    Left  . Sleep  apnea    no CPAP per MD    Past Surgical History:  Procedure Laterality Date  . BACK SURGERY     64yrs ago  . BREAST BIOPSY Right 2019   fibroadenoma  . CESAREAN SECTION  02/01/2005  . CESAREAN  SECTION  2001  . LAPAROSCOPIC GASTRIC SLEEVE RESECTION N/A 07/29/2017   Procedure: LAPAROSCOPIC GASTRIC SLEEVE RESECTION WITH UPPER ENDO ;  Surgeon: [name], MD;  Location: [facility];  Service: General;  Laterality: N/A;  . OOPHORECTOMY     Rt.removed  . TUBAL LIGATION  02/01/2005     Current Outpatient Medications on File Prior to Visit  Medication Sig Dispense Refill  . amLODipine (NORVASC) 5 MG tablet Take 1 tablet (5 mg total) by mouth daily. 90 tablet 3  . Biotin w/ Vitamins C & E (HAIR/SKIN/NAILS PO) Take 1 tablet by mouth daily.    . hydrochlorothiazide (HYDRODIURIL) 25 MG tablet 1 po qd 90 tablet 1  . meclizine (ANTIVERT) 12.5 MG tablet Take 1 tablet (12.5 mg total) by mouth 3 (three) times daily as needed for dizziness. 30 tablet 0  . Multiple Vitamins-Minerals (CELEBRATE MULTI-COMPLETE 36 PO) Take 1 each by mouth daily.    . nicotine (NICODERM CQ - DOSED IN MG/24 HOURS) 14 mg/24hr patch Place 1 patch (14 mg total) onto the skin daily. 28 patch 1  . potassium chloride (KLOR-CON) 10 MEQ tablet Take 1 tablet (10 mEq total) by mouth daily. 90 tablet 1  . famotidine (PEPCID) 20 MG tablet Take 1 tablet (20 mg total) by mouth 2 (two) times daily. (Patient not taking: Reported on 09/05/2020) 30 tablet 0  . omeprazole (PRILOSEC) 20 MG capsule Take 1 capsule (20 mg total) by mouth 2 (two) times daily before a meal. (Patient not taking: Reported on 09/05/2020) 30 capsule 0   No current facility-administered medications on file prior to visit.    No Known Allergies  Physical exam:  Today's Vitals   09/05/20 0819  BP: (!) 144/97  Pulse: 79  Weight: 202 lb (91.6 kg)  Height: 5\' 5"  (1.651 m)   Body mass index is 33.61 kg/m.   Wt Readings from Last 3 Encounters:  09/05/20 202 lb  (91.6 kg)  07/05/20 202 lb (91.6 kg)  05/05/20 197 lb 9.6 oz (89.6 kg)     Ht Readings from Last 3 Encounters:  09/05/20 5\' 5"  (1.651 m)  07/05/20 5\' 5"  (1.651 m)  05/05/20 5\' 5"  (1.651 m)      General: The patient is awake, alert and appears not in acute distress. The patient is well groomed. Head: Normocephalic, atraumatic. Neck is supple. Mallampati 2 ,  neck circumference: 15 inches . Nasal airflow is patent.   Cardiovascular:  Regular rate and cardiac rhythm by pulse,  without distended neck veins. Respiratory: Lungs are clear to auscultation.  Skin:  Without evidence of ankle edema, or rash. Trunk: The patient's posture is erect.   Neurologic exam : The patient is awake and alert, oriented to place and time.   Memory subjective described as intact.  Attention span & concentration ability appears normal.  Speech is fluent,  without  dysarthria, dysphonia or aphasia.  Mood and affect are appropriate.   Cranial nerves: no loss of smell or taste reported  New prescripotion glasses , bifocal.  Pupils are equal and briskly reactive to light. Funduscopic exam reveals no pallor, no disc swelling. Marland Kitchen  Extraocular movements in vertical and horizontal planes were intact and without nystagmus. No Diplopia. Visual fields by finger perimetry are intact. Hearing was intact to soft voice and finger rubbing.    Facial sensation intact to fine touch.  Facial motor strength is symmetric and tongue and uvula move midline.  Neck ROM : rotation, tilt and flexion extension  were normal for age and shoulder shrug was symmetrical.    Motor exam:  Symmetric bulk, tone and ROM.   Normal tone without cog wheeling, symmetric grip strength .   Sensory:  Fine touch, pinprick and vibration were tested  and  normal. Ankle edema made the sensation less intense.  Proprioception tested in the upper extremities was normal.   Coordination: Rapid alternating movements in the fingers/hands were of normal  speed.  The Finger-to-nose maneuver was intact without evidence of ataxia, dysmetria or tremor.   Gait and station: Patient could rise unassisted from a seated position, walked without assistive device.  Stance is of normal width/ base and the patient turned with 3 steps.  Toe and heel walk were deferred.  Deep tendon reflexes: in the  upper and lower extremities are symmetric and intact.  Babinski response was deferred .       After spending a total time of  45  minutes face to face with  time for physical and neurologic examination, review of laboratory studies,  personal review of imaging studies, reports and results of other testing and review of referral information / records as far as provided in visit, I have established the following assessments:  1)   Headaches have improved over the last 6 weeks or so, following the BP medication increase.  2)  Light headedness - has improved as well.  3)  No acute visio changes.  4) stress induced Insomnia, caretaker function. Snoring is louder again.    My Plan is to proceed with:  1) MRI braIn with and without, no diabetes, no thyroid disease.  2) HST to confirm no need for OSA- she was followed Dr Elsworth Soho.  3)   I would like to thank Dr Elsworth Soho and Carollee Herter, [address], [address] [place],  [place] [address] for allowing me to meet with and to take care of this pleasant patient.   In short, Makayla Good is presenting with snoring, BP related  Vascular headaches, and a histoyr of tobacco use.  CT finding was non- diagnostic.   I plan to follow up either personally or through our NP within 3 month.   CC: I will share my notes with [facility]   Electronically signed by: Larey Seat, MD 09/05/2020 8:35 AM  [facility] Board certified by The [employer] of Sleep Medicine and Diplomate of the [employer] of Sleep Medicine. Board certified In Neurology through the  [place], Fellow of the [employer] of Neurology. Medical Director of [facility].

## 2020-09-05 NOTE — Patient Instructions (Signed)
Tension Headache, Adult A tension headache is a feeling of pain, pressure, or aching over the front and sides of the head. The pain can be dull, or it can feel tight. There are two types of tension headache:  Episodic tension headache. This is when the headaches happen fewer than 15 days a month.  Chronic tension headache. This is when the headaches happen more than 15 days a month during a 3-month period. A tension headache can last from 30 minutes to several days. It is the most common kind of headache. Tension headaches are not normally associated with nausea or vomiting, and they do not get worse with physical activity. What are the causes? The exact cause of this condition is not known. Tension headaches are often triggered by stress, anxiety, or depression. Other triggers may include:  Alcohol.  Too much caffeine or caffeine withdrawal.  Respiratory infections, such as colds, flu, or sinus infections.  Dental problems or teeth clenching.  Fatigue.  Holding your head and neck in the same position for a long period of time, such as while using a computer.  Smoking.  Arthritis of the neck. What are the signs or symptoms? Symptoms of this condition include:  A feeling of pressure or tightness around the head.  Dull, aching head pain.  Pain over the front and sides of the head.  Tenderness in the muscles of the head, neck, and shoulders. How is this diagnosed? This condition may be diagnosed based on your symptoms, your medical history, and a physical exam. If your symptoms are severe or unusual, you may have imaging tests, such as a CT scan or an MRI of your head. Your vision may also be checked. How is this treated? This condition may be treated with lifestyle changes and with medicines that help relieve symptoms. Follow these instructions at home: Managing pain  Take over-the-counter and prescription medicines only as told by your health care provider.  When you have  a headache, lie down in a dark, quiet room.  If directed, put ice on your head and neck. To do this: ? Put ice in a plastic bag. ? Place a towel between your skin and the bag. ? Leave the ice on for 20 minutes, 2-3 times a day. ? Remove the ice if your skin turns bright red. This is very important. If you cannot feel pain, heat, or cold, you have a greater risk of damage to the area.  If directed, apply heat to the back of your neck as often as told by your health care provider. Use the heat source that your health care provider recommends, such as a moist heat pack or a heating pad. ? Place a towel between your skin and the heat source. ? Leave the heat on for 20-30 minutes. ? Remove the heat if your skin turns bright red. This is especially important if you are unable to feel pain, heat, or cold. You have a greater risk of getting burned. Eating and drinking  Eat meals on a regular schedule.  If you drink alcohol: ? Limit how much you have to:  0-1 drink a day for women who are not pregnant.  0-2 drinks a day for men. ? Know how much alcohol is in your drink. In the [place] one drink equals one 12 oz bottle of beer (355 mL), one 5 oz glass of wine (148 mL), or one 1 oz glass of hard liquor (44 mL).  Drink enough fluid to keep your urine   pale yellow.  Decrease your caffeine intake, or stop using caffeine. Lifestyle  Get 7-9 hours of sleep each night, or get the amount of sleep recommended by your health care provider.  At bedtime, remove computers, phones, and tablets from your room.  Find ways to manage your stress. This may include: ? Exercise. ? Deep breathing exercises. ? Yoga. ? Listening to music. ? Positive mental imagery.  Try to sit up straight and avoid tensing your muscles.  Do not use any products that contain nicotine or tobacco. These include cigarettes, chewing tobacco, and vaping devices, such as e-cigarettes. If you need help quitting, ask your health care  provider. General instructions  Avoid any headache triggers. Keep a journal to help find out what may trigger your headaches. For example, write down: ? What you eat and drink. ? How much sleep you get. ? Any change to your diet or medicines.  Keep all follow-up visits. This is important.   Contact a health care provider if:  Your headache does not get better.  Your headache comes back.  You are sensitive to sounds, light, or smells because of a headache.  You have nausea or you vomit.  Your stomach hurts. Get help right away if:  You suddenly develop a severe headache, along with any of the following: ? A stiff neck. ? Nausea and vomiting. ? Confusion. ? Weakness in one part or one side of your body. ? Double vision or loss of vision. ? Shortness of breath. ? Rash. ? Unusual sleepiness. ? Fever or chills. ? Trouble speaking. ? Pain in your eye or ear. ? Trouble walking or balancing. ? Feeling faint or passing out. Summary  A tension headache is a feeling of pain, pressure, or aching over the front and sides of the head.  A tension headache can last from 30 minutes to several days. It is the most common kind of headache.  This condition may be diagnosed based on your symptoms, your medical history, and a physical exam.  This condition may be treated with lifestyle changes and with medicines that help relieve symptoms. This information is not intended to replace advice given to you by your health care provider. Make sure you discuss any questions you have with your health care provider. Document Revised: 01/07/2020 Document Reviewed: 01/07/2020 Elsevier Patient Education  2021 [employer]  

## 2020-09-07 ENCOUNTER — Encounter: Payer: Self-pay | Admitting: Obstetrics and Gynecology

## 2020-09-07 ENCOUNTER — Other Ambulatory Visit (HOSPITAL_COMMUNITY): Payer: Self-pay | Admitting: Surgery

## 2020-09-07 ENCOUNTER — Other Ambulatory Visit: Payer: Self-pay | Admitting: Surgery

## 2020-09-07 ENCOUNTER — Other Ambulatory Visit: Payer: Self-pay

## 2020-09-07 ENCOUNTER — Ambulatory Visit (INDEPENDENT_AMBULATORY_CARE_PROVIDER_SITE_OTHER): Payer: BC Managed Care – PPO | Admitting: Obstetrics and Gynecology

## 2020-09-07 VITALS — BP 122/82 | HR 81 | Ht 65.0 in | Wt 202.0 lb

## 2020-09-07 DIAGNOSIS — Z01419 Encounter for gynecological examination (general) (routine) without abnormal findings: Secondary | ICD-10-CM

## 2020-09-07 DIAGNOSIS — Z119 Encounter for screening for infectious and parasitic diseases, unspecified: Secondary | ICD-10-CM | POA: Diagnosis not present

## 2020-09-07 NOTE — Patient Instructions (Signed)

## 2020-09-10 IMAGING — CR DG CHEST 2V
2 series · 2 of 2 positions shown · non-contrast
Comparison: Chest radiographs 11/28/2016 and earlier.

CLINICAL DATA: 48-year-old female with left side chest pain
radiating to the left axilla onset this evening.

EXAM:
CHEST - 2 VIEW

[w chest pa]
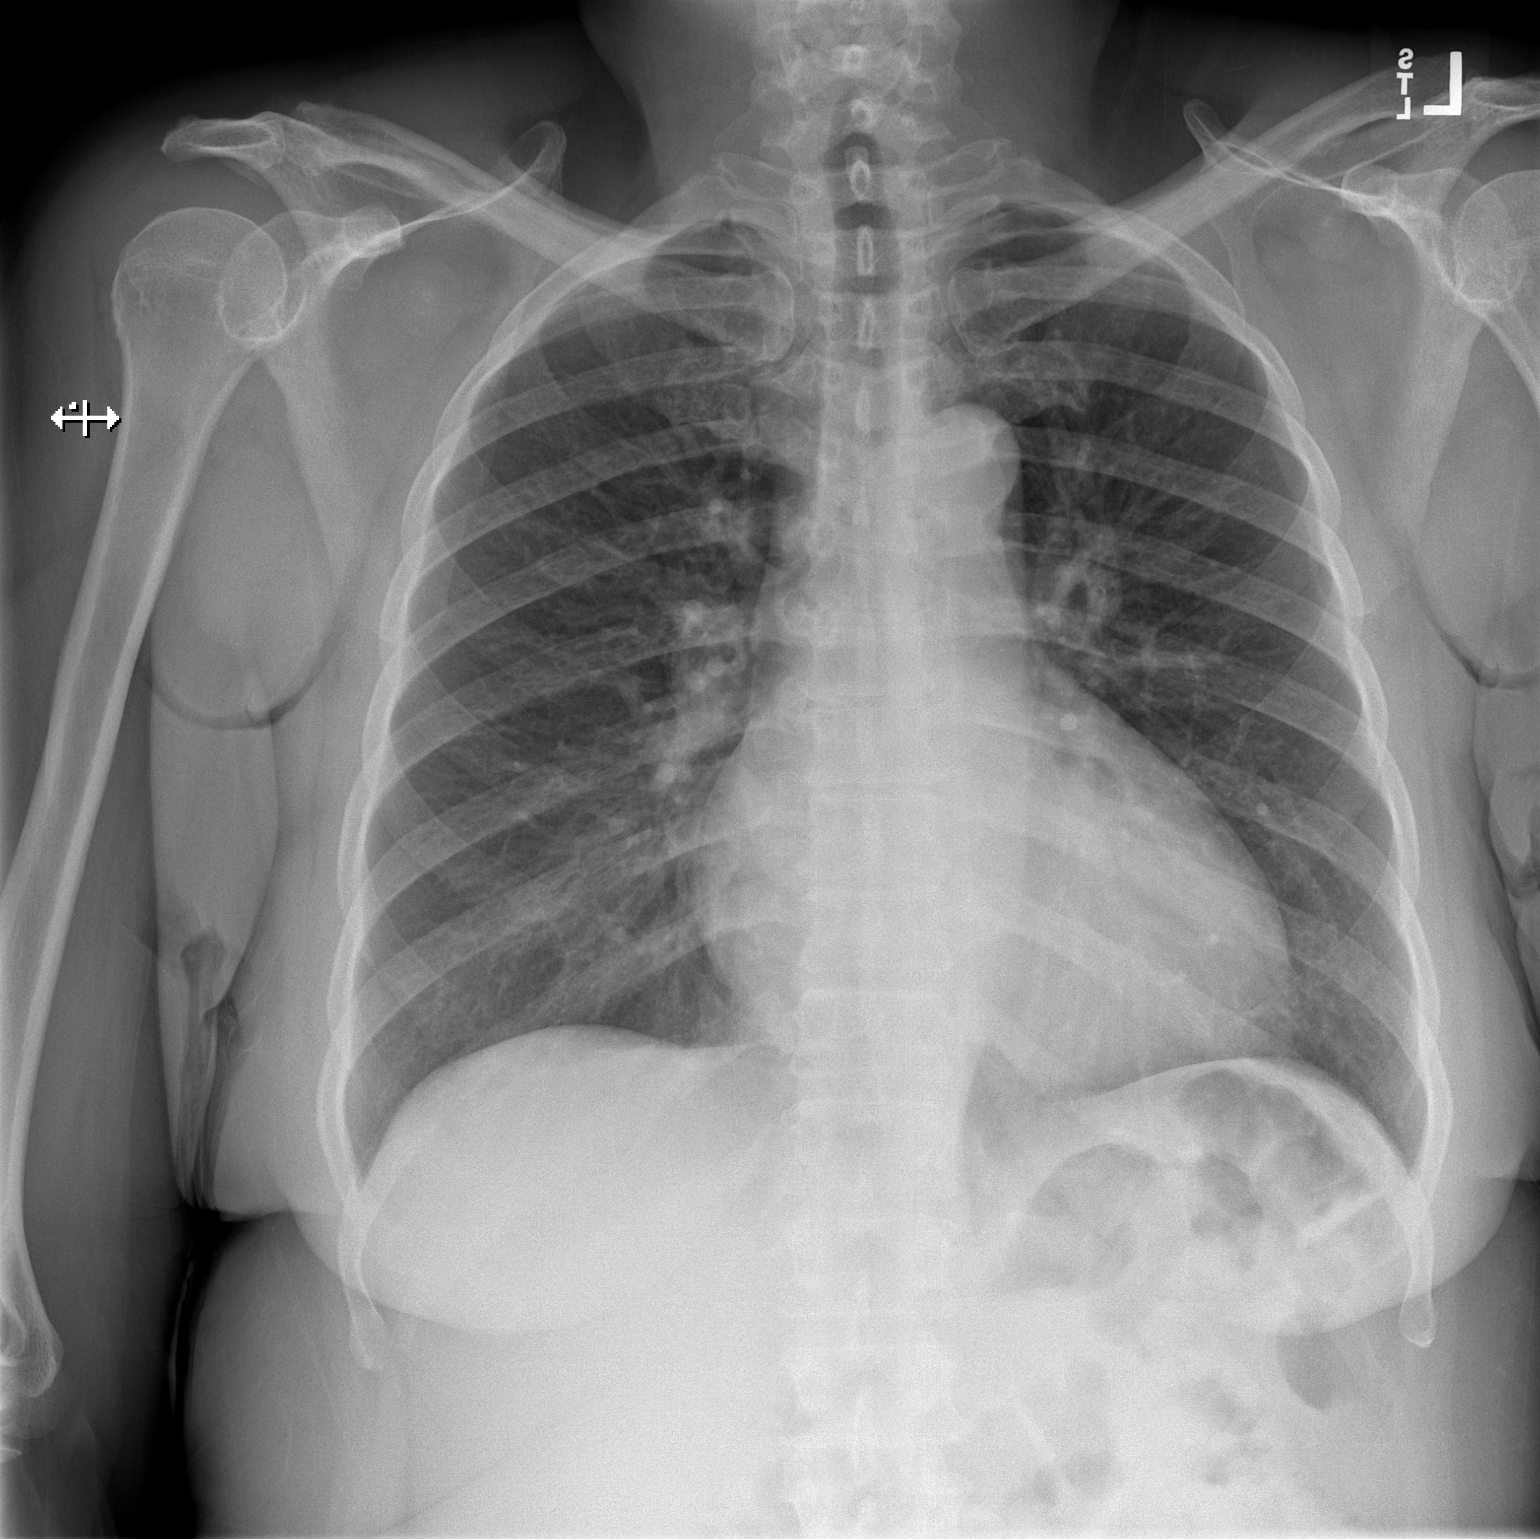

[w chest lat]
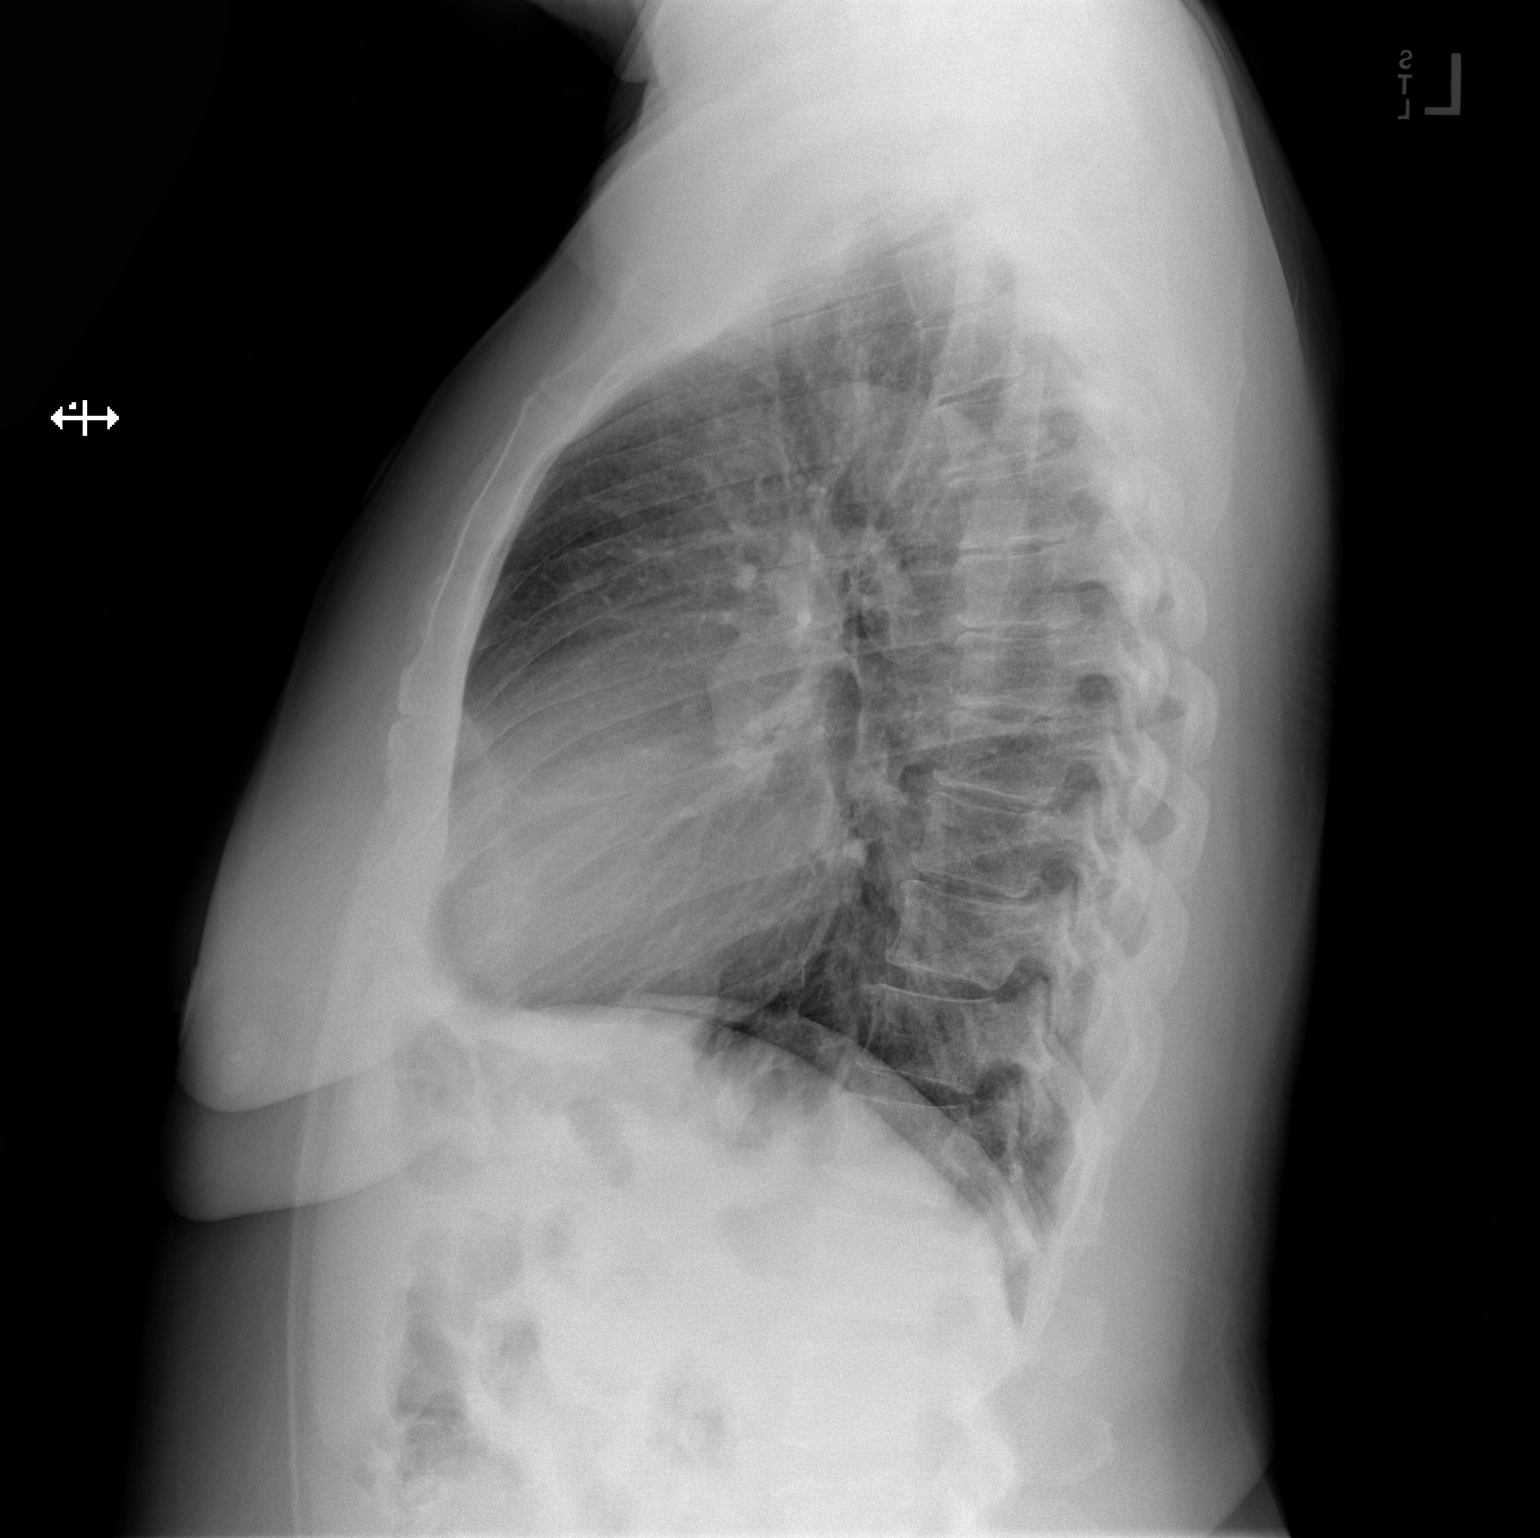

[2 of 2 positions shown; findings below may reference images not displayed]

FINDINGS: PA and lateral views. Stable cardiac size at the upper limits of
normal to mildly enlarged. Other mediastinal contours are within
normal limits. Visualized tracheal air column is within normal
limits. Both lungs are clear. No pneumothorax or pleural effusion.
No acute osseous abnormality identified. Negative visible bowel gas
pattern.
IMPRESSION: No acute cardiopulmonary abnormality. Chronic borderline to mild
cardiomegaly.

## 2020-09-15 ENCOUNTER — Ambulatory Visit
Admission: RE | Admit: 2020-09-15 | Discharge: 2020-09-15 | Disposition: A | Discharge status: Home / Self Care | Payer: BC Managed Care – PPO | Source: Ambulatory Visit | Attending: Neurology | Admitting: Neurology

## 2020-09-15 DIAGNOSIS — R930 Abnormal findings on diagnostic imaging of skull and head, not elsewhere classified: Secondary | ICD-10-CM

## 2020-09-15 DIAGNOSIS — R519 Headache, unspecified: Secondary | ICD-10-CM

## 2020-09-15 DIAGNOSIS — E8881 Metabolic syndrome: Secondary | ICD-10-CM

## 2020-09-15 DIAGNOSIS — R0683 Snoring: Secondary | ICD-10-CM

## 2020-09-15 DIAGNOSIS — F17200 Nicotine dependence, unspecified, uncomplicated: Secondary | ICD-10-CM

## 2020-09-15 DIAGNOSIS — R059 Cough, unspecified: Secondary | ICD-10-CM

## 2020-09-15 MED ORDER — GADOBENATE DIMEGLUMINE 529 MG/ML IV SOLN
20.0000 mL | Freq: Once | INTRAVENOUS | Status: AC | PRN
  Administered 2020-09-15: 20 mL via INTRAVENOUS

## 2020-09-16 ENCOUNTER — Other Ambulatory Visit: Payer: Self-pay | Admitting: Family Medicine

## 2020-09-16 DIAGNOSIS — R600 Localized edema: Secondary | ICD-10-CM

## 2020-09-16 DIAGNOSIS — I10 Essential (primary) hypertension: Secondary | ICD-10-CM

## 2020-09-16 NOTE — Progress Notes (Signed)
  IMPRESSION: This MRI of the brain with and without contrast shows the following: 1.   Brain parenchyma is normal. 2.   As noted on the CT scan earlier, there is a minimal partially empty sella turcica.  The pituitary gland has a normal contour and this is likely just an incidental finding.  However, if papilledema is present consider elevated intracranial pressure. 3.  No acute findings.  Normal enhancement pattern.

## 2020-09-20 ENCOUNTER — Encounter: Payer: Self-pay | Admitting: Neurology

## 2020-09-22 ENCOUNTER — Ambulatory Visit (HOSPITAL_COMMUNITY)
Admission: RE | Admit: 2020-09-22 | Discharge: 2020-09-22 | Disposition: A | Discharge status: Home / Self Care | Payer: BC Managed Care – PPO | Source: Ambulatory Visit | Attending: Surgery | Admitting: Surgery

## 2020-09-22 ENCOUNTER — Other Ambulatory Visit: Payer: Self-pay

## 2020-09-22 DIAGNOSIS — R1011 Right upper quadrant pain: Secondary | ICD-10-CM | POA: Diagnosis not present

## 2020-09-22 MED ORDER — TECHNETIUM TC 99M MEBROFENIN IV KIT
5.2000 | PACK | Freq: Once | INTRAVENOUS | Status: AC | PRN
  Administered 2020-09-22: 5.2 via INTRAVENOUS

## 2020-09-30 ENCOUNTER — Emergency Department (HOSPITAL_BASED_OUTPATIENT_CLINIC_OR_DEPARTMENT_OTHER): Payer: BC Managed Care – PPO

## 2020-09-30 ENCOUNTER — Other Ambulatory Visit: Payer: Self-pay

## 2020-09-30 ENCOUNTER — Encounter (HOSPITAL_BASED_OUTPATIENT_CLINIC_OR_DEPARTMENT_OTHER): Payer: Self-pay | Admitting: *Deleted

## 2020-09-30 ENCOUNTER — Emergency Department (HOSPITAL_BASED_OUTPATIENT_CLINIC_OR_DEPARTMENT_OTHER)
Admission: EM | Admit: 2020-09-30 | Discharge: 2020-09-30 | Disposition: A | Discharge status: Home / Self Care | Payer: BC Managed Care – PPO | Attending: Emergency Medicine | Admitting: Emergency Medicine

## 2020-09-30 DIAGNOSIS — Z79899 Other long term (current) drug therapy: Secondary | ICD-10-CM | POA: Insufficient documentation

## 2020-09-30 DIAGNOSIS — J45909 Unspecified asthma, uncomplicated: Secondary | ICD-10-CM | POA: Insufficient documentation

## 2020-09-30 DIAGNOSIS — M47812 Spondylosis without myelopathy or radiculopathy, cervical region: Secondary | ICD-10-CM | POA: Diagnosis not present

## 2020-09-30 DIAGNOSIS — M79601 Pain in right arm: Secondary | ICD-10-CM | POA: Diagnosis not present

## 2020-09-30 DIAGNOSIS — Z87891 Personal history of nicotine dependence: Secondary | ICD-10-CM | POA: Diagnosis not present

## 2020-09-30 DIAGNOSIS — M4802 Spinal stenosis, cervical region: Secondary | ICD-10-CM | POA: Diagnosis not present

## 2020-09-30 DIAGNOSIS — M79622 Pain in left upper arm: Secondary | ICD-10-CM | POA: Insufficient documentation

## 2020-09-30 DIAGNOSIS — I10 Essential (primary) hypertension: Secondary | ICD-10-CM | POA: Insufficient documentation

## 2020-09-30 DIAGNOSIS — M2578 Osteophyte, vertebrae: Secondary | ICD-10-CM | POA: Diagnosis not present

## 2020-09-30 DIAGNOSIS — M79621 Pain in right upper arm: Secondary | ICD-10-CM | POA: Diagnosis not present

## 2020-09-30 DIAGNOSIS — M5412 Radiculopathy, cervical region: Secondary | ICD-10-CM | POA: Diagnosis not present

## 2020-09-30 DIAGNOSIS — E041 Nontoxic single thyroid nodule: Secondary | ICD-10-CM | POA: Diagnosis not present

## 2020-09-30 DIAGNOSIS — R079 Chest pain, unspecified: Secondary | ICD-10-CM | POA: Diagnosis not present

## 2020-09-30 DIAGNOSIS — M79602 Pain in left arm: Secondary | ICD-10-CM | POA: Diagnosis not present

## 2020-09-30 LAB — COMPREHENSIVE METABOLIC PANEL
ALT: 27 U/L (ref 0–44)
AST: 23 U/L (ref 15–41)
Albumin: 4.4 g/dL (ref 3.5–5.0)
Alkaline Phosphatase: 55 U/L (ref 38–126)
Anion gap: 8 (ref 5–15)
BUN: 16 mg/dL (ref 6–20)
CO2: 28 mmol/L (ref 22–32)
Calcium: 9.6 mg/dL (ref 8.9–10.3)
Chloride: 103 mmol/L (ref 98–111)
Creatinine, Ser: 0.69 mg/dL (ref 0.44–1.00)
GFR, Estimated: >60 mL/min (ref >60)
Glucose, Bld: 97 mg/dL (ref 70–99)
Potassium: 3.4 mmol/L — ABNORMAL LOW (ref 3.5–5.1)
Sodium: 139 mmol/L (ref 135–145)
Total Bilirubin: 0.3 mg/dL (ref 0.3–1.2)
Total Protein: 7.8 g/dL (ref 6.5–8.1)

## 2020-09-30 LAB — CBC WITH DIFFERENTIAL/PLATELET
Abs Immature Granulocytes: 0 10*3/uL (ref 0.00–0.07)
Basophils Absolute: 0.1 10*3/uL (ref 0.0–0.1)
Basophils Relative: 1 %
Eosinophils Absolute: 0.1 10*3/uL (ref 0.0–0.5)
Eosinophils Relative: 1 %
HCT: 39.9 % (ref 36.0–46.0)
Hemoglobin: 13.8 g/dL (ref 12.0–15.0)
Immature Granulocytes: 0 %
Lymphocytes Relative: 47 %
Lymphs Abs: 2.7 10*3/uL (ref 0.7–4.0)
MCH: 33.1 pg (ref 26.0–34.0)
MCHC: 34.6 g/dL (ref 30.0–36.0)
MCV: 95.7 fL (ref 80.0–100.0)
Monocytes Absolute: 0.4 10*3/uL (ref 0.1–1.0)
Monocytes Relative: 7 %
Neutro Abs: 2.6 10*3/uL (ref 1.7–7.7)
Neutrophils Relative %: 44 %
Platelets: 209 10*3/uL (ref 150–400)
RBC: 4.17 MIL/uL (ref 3.87–5.11)
RDW: 12.2 % (ref 11.5–15.5)
WBC: 5.9 10*3/uL (ref 4.0–10.5)
nRBC: 0 % (ref 0.0–0.2)

## 2020-09-30 LAB — TROPONIN I (HIGH SENSITIVITY): Troponin I (High Sensitivity): <2 ng/L (ref <18)

## 2020-09-30 MED ORDER — POTASSIUM CHLORIDE CRYS ER 20 MEQ PO TBCR
10.0000 meq | EXTENDED_RELEASE_TABLET | Freq: Once | ORAL | Status: AC
  Administered 2020-09-30: 10 meq via ORAL

## 2020-09-30 NOTE — ED Triage Notes (Signed)
Sharp pain in both arms x 2 hours while at work.

## 2020-09-30 NOTE — Discharge Instructions (Addendum)
You had a CT scan performed today that showed degenerative changes in your neck, which may be contributing to your symptoms. You may take ibuprofen or Tylenol, available over-the-counter according to label instructions as needed for pain.  Get rechecked immediately if you develop weakness in your arms.  Your CT scan also showed difficult to characterize spots on your thyroid gland. This will need to be followed up by your family doctor for further evaluation.

## 2020-09-30 NOTE — ED Notes (Signed)
Pt not currently in room. Will obtain EKG when pt returns.

## 2020-09-30 NOTE — ED Provider Notes (Signed)
[facility] EMERGENCY DEPARTMENT Provider Note   CSN: [id number] Arrival date & time: 09/30/20  1536     History Chief Complaint  Patient presents with   Arm Pain    Makayla Good is a 50 y.o. female.  The history is provided by the patient and medical records.  Arm Pain Makayla Good is a 50 y.o. female who presents to the Emergency Department complaining of arm pain. She presents the emergency department complaining of bilateral upper arm pain at that started around 11 o'clock today. His symptoms occurred while she was at rest at work. Pain is described as shooting in nature. Episodes last few seconds at a time. Pain is a little bit worse in the left arm when compared to the right. She also has associated pins and needles sensation in the arm. There are no clear alleviating or worsening factors. She also did have brief transient chest pain earlier today as well. She has experienced similar but milder episodes in the past but none recently. No associated headache, neck pain, shortness of breath, abdominal pain, nausea, vomiting, numbness, weakness. She has a history of hypertension, no additional medical problems. She is a former smoker. No alcohol or drug use. No personal or family history of cardiac disease. She is not on hormones.     Past Medical History:  Diagnosis Date   Asthma    Eczema    Fibroid    H/O oophorectomy    Hypertension    Obesity    Polycystic ovary    Left   Sleep apnea    no CPAP per MD    Patient Active Problem List   Diagnosis Date Noted   Increased frequency of headaches 09/05/2020   Abnormal CT of the head 09/05/2020   Loud snoring 09/05/2020   Gastroesophageal reflux disease 05/05/2020   Hyperglycemia 05/05/2020   Lower extremity edema 09/22/2019   Lymphedema 09/22/2019   Primary hypertension 08/05/2017   Hypokalemia 08/05/2017   OSA (obstructive sleep apnea) 03/05/2017   Asthma 03/05/2017   Dizziness  02/08/2017   Preventative health care 02/08/2017   Abdominal pain 07/05/2016   RUQ pain 07/05/2016   Irregular periods 07/26/2014   Dysuria 02/04/2014   Severe obesity (BMI >= 40) ([place]) 10/21/2012   OA (osteoarthritis) of knee 06/04/2012   Cough 03/05/2012   Plantar fasciitis 09/04/2010   HYPOKALEMIA 07/01/2009   CHEST PAIN UNSPECIFIED 05/18/2009   DEPRESSION 03/07/2009   FATIGUE 03/07/2009   TOBACCO USER 12/03/2008   Morbid obesity ([place]) 05/21/2008   Hyperlipidemia LDL goal <100 17/79/3903   DYSMETABOLIC SYNDROME X 00/92/3300   CONJUNCTIVITIS NOS 02/04/2007   VAGINITIS NOS 11/07/2006   FUNGAL DERMATITIS 10/09/2006   Benign essential HTN 10/09/2006    Past Surgical History:  Procedure Laterality Date   BACK SURGERY     5yrs ago   BREAST BIOPSY Right 2019   fibroadenoma   CESAREAN SECTION  02/01/2005   CESAREAN SECTION  2001   LAPAROSCOPIC GASTRIC SLEEVE RESECTION N/A 07/29/2017   Procedure: LAPAROSCOPIC GASTRIC SLEEVE RESECTION WITH UPPER ENDO ;  Surgeon: [name], MD;  Location: [facility];  Service: General;  Laterality: N/A;   OOPHORECTOMY     Rt.removed   TUBAL LIGATION  02/01/2005     OB History     Gravida  5   Para  2   Term      Preterm      AB  3   Living  2  SAB  2   IAB  1   Ectopic      Multiple      Live Births              Family History  Problem Relation Age of Onset   Hypertension Mother    Schizophrenia Mother    Dementia Mother    Mental illness Mother        schizophrenia, dementia   Hypertension Father    Coronary artery disease Father        Stent   Alzheimer's disease Father    Hypertension Sister    Hypertension Sister    Arthritis Sister        rheumatoid   Dementia Maternal Aunt    Kidney disease Other    Colon cancer Neg Hx    Stomach cancer Neg Hx    Pancreatic cancer Neg Hx    Esophageal cancer Neg Hx    Rectal cancer Neg Hx     Social History   Tobacco Use   Smoking status: Former     Packs/day: 0.50    Years: 30.00    Pack years: 15.00    Types: Cigarettes    Start date: 04/28/2020   Smokeless tobacco: Never   Tobacco comments:    will try chantix   Vaping Use   Vaping Use: Some days  Substance Use Topics   Alcohol use: Not Currently   Drug use: No    Home Medications Prior to Admission medications   Medication Sig Start Date End Date Taking? Authorizing Provider  amLODipine (NORVASC) 5 MG tablet Take 1 tablet (5 mg total) by mouth daily. 05/05/20   Roma Schanz R, DO  ASHWAGANDHA PO Take 1 tablet by mouth daily.    [provider]  Biotin w/ Vitamins C & E (HAIR/SKIN/NAILS PO) Take 1 tablet by mouth daily.    [provider]  famotidine (PEPCID) 20 MG tablet Take 1 tablet (20 mg total) by mouth 2 (two) times daily. 04/29/20   Wieters, Hallie C, PA-C  hydrochlorothiazide (HYDRODIURIL) 25 MG tablet TAKE 1 TABLET BY MOUTH EVERY DAY 09/16/20   Carollee Herter, Alferd Apa, DO  meclizine (ANTIVERT) 12.5 MG tablet Take 1 tablet (12.5 mg total) by mouth 3 (three) times daily as needed for dizziness. 07/05/20   Saguier, Percell Miller, PA-C  Multiple Vitamins-Minerals (CELEBRATE MULTI-COMPLETE 36 PO) Take 1 each by mouth daily.    [provider]  nicotine (NICODERM CQ - DOSED IN MG/24 HOURS) 14 mg/24hr patch Place 1 patch (14 mg total) onto the skin daily. 02/23/20   [name], DO  omeprazole (PRILOSEC) 20 MG capsule Take 1 capsule (20 mg total) by mouth 2 (two) times daily before a meal. 04/29/20   Wieters, Hallie C, PA-C  potassium chloride (KLOR-CON) 10 MEQ tablet Take 1 tablet (10 mEq total) by mouth daily. 09/05/20   Dohmeier, Asencion Partridge, MD    Allergies    Patient has no known allergies.  Review of Systems   Review of Systems  All other systems reviewed and are negative.  Physical Exam Updated Vital Signs BP 132/87   Pulse 75   Temp 98.4 F (36.9 C) (Oral)   Resp 10   Ht 5\' 5"  (1.651 m)   Wt 91.6 kg   LMP 07/27/2020 (Within Days)    SpO2 100%   BMI 33.60 kg/m   Physical Exam Vitals and nursing note reviewed.  Constitutional:      Appearance:  She is well-developed.  HENT:     Head: Normocephalic and atraumatic.  Cardiovascular:     Rate and Rhythm: Normal rate and regular rhythm.     Heart sounds: No murmur heard. Pulmonary:     Effort: Pulmonary effort is normal. No respiratory distress.     Breath sounds: Normal breath sounds.  Abdominal:     Palpations: Abdomen is soft.     Tenderness: There is no abdominal tenderness. There is no guarding or rebound.  Musculoskeletal:        General: No tenderness.     Cervical back: Neck supple.     Comments: 2+ radial pulses bilaterally  Skin:    General: Skin is warm and dry.  Neurological:     Mental Status: She is alert and oriented to person, place, and time.     Comments: Five out of five strength in bilateral upper extremities in the proximal and distal muscle groups. Sensation light touch intact and bilateral upper extremities  Psychiatric:        Behavior: Behavior normal.    ED Results / Procedures / Treatments   Labs (all labs ordered are listed, but only abnormal results are displayed) Labs Reviewed  COMPREHENSIVE METABOLIC PANEL - Abnormal; Notable for the following components:      Result Value   Potassium 3.4 (*)    All other components within normal limits  CBC WITH DIFFERENTIAL/PLATELET  TROPONIN I (HIGH SENSITIVITY)    EKG EKG Interpretation  Date/Time:  Friday September 30 2020 16:59:51 EDT Ventricular Rate:  72 PR Interval:  157 QRS Duration: 96 QT Interval:  395 QTC Calculation: 433 R Axis:   53 Text Interpretation: Sinus rhythm Confirmed by Quintella Reichert [phone]) on 09/30/2020 5:29:06 PM  Radiology DG Chest 2 View  Result Date: 09/30/2020 CLINICAL DATA:  Chest pain. EXAM: CHEST - 2 VIEW COMPARISON:  January 05, 2019. FINDINGS: The heart size and mediastinal contours are within normal limits. Both lungs are clear. The visualized  skeletal structures are unremarkable. IMPRESSION: No active cardiopulmonary disease. Electronically Signed   By: Marijo Conception M.D.   On: 09/30/2020 16:51   CT Cervical Spine Wo Contrast  Result Date: 09/30/2020 CLINICAL DATA:  Arm pain for 2 hours, no known injury, initial encounter EXAM: CT CERVICAL SPINE WITHOUT CONTRAST TECHNIQUE: Multidetector CT imaging of the cervical spine was performed without intravenous contrast. Multiplanar CT image reconstructions were also generated. COMPARISON:  None. FINDINGS: Alignment: Within normal limits. Skull base and vertebrae: 7 cervical segments are well visualized. Vertebral body height is well maintained. Mild osteophytic changes are noted worst at C5-6 causing very mild central canal stenosis and neural foraminal narrowing. No significant disc protrusions are noted. No acute fracture or acute facet abnormality is noted. Soft tissues and spinal canal: Surrounding soft tissue structures demonstrate multiple hypodensities within the thyroid bilaterally. The largest of these measures 16 mm in dimension. Upper chest: Negative. Other: None. IMPRESSION: Mild degenerative changes worst at C5-6 with mild central canal stenosis and neural foraminal narrowing. No significant disc protrusion is seen. Scattered hypodensities within the thyroid as described. Recommend nonemergent thyroid US(ref: J Am Coll Radiol. 2015 Feb;12(2): 143-50). Electronically Signed   By: Inez Catalina M.D.   On: 09/30/2020 17:16    Procedures Procedures   Medications Ordered in ED Medications  potassium chloride SA (KLOR-CON) CR tablet 10 mEq (has no administration in time range)    ED Course  I have reviewed the triage vital signs and the  nursing notes.  Pertinent labs & imaging results that were available during my care of the patient were reviewed by me and considered in my medical decision making (see chart for details).    MDM Rules/Calculators/A&P                         Patient  here for evaluation of pain to bilateral upper arms. She is neurovascularly intact on examination. Presentation is not consistent with ACS, PE, dissection, CVA, epidural abscess. CT scan with degenerative changes as well as incidental finding of thyroid hypodensities. Question if her pain may be related to cervical radiculopathy. No evidence of acute cord injury at this time. Discussed with patient home care with PCP follow-up as well as return precautions.   Final Clinical Impression(s) / ED Diagnoses Final diagnoses:  Pain in both upper extremities    Rx / DC Orders ED Discharge Orders     None        Quintella Reichert, MD 09/30/20 2130

## 2020-10-03 ENCOUNTER — Ambulatory Visit (INDEPENDENT_AMBULATORY_CARE_PROVIDER_SITE_OTHER): Payer: BC Managed Care – PPO | Admitting: Neurology

## 2020-10-03 DIAGNOSIS — R519 Headache, unspecified: Secondary | ICD-10-CM

## 2020-10-03 DIAGNOSIS — G4733 Obstructive sleep apnea (adult) (pediatric): Secondary | ICD-10-CM | POA: Diagnosis not present

## 2020-10-03 DIAGNOSIS — E8881 Metabolic syndrome: Secondary | ICD-10-CM

## 2020-10-03 DIAGNOSIS — F17200 Nicotine dependence, unspecified, uncomplicated: Secondary | ICD-10-CM

## 2020-10-03 DIAGNOSIS — R059 Cough, unspecified: Secondary | ICD-10-CM

## 2020-10-03 DIAGNOSIS — R0683 Snoring: Secondary | ICD-10-CM

## 2020-10-03 DIAGNOSIS — R930 Abnormal findings on diagnostic imaging of skull and head, not elsewhere classified: Secondary | ICD-10-CM

## 2020-10-04 NOTE — Progress Notes (Signed)
[facility] TEST  REPORT (Watch PAT)  STUDY DATE: 10-04-20  DOB: 1970-10-23  MRN: [id number]  ORDERING CLINICIAN: Larey Seat, MD   REFERRING CLINICIAN: Carollee Herter, Alferd Apa, DO  CLINICAL INFORMATION/HISTORY: Makayla Good is a 50 y.o. African American female and seen upon consultation referral on 09/05/2020 from Dr Etter Sjogren. 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  She reports 3 problems: 1)Vertigo,  also lightheadedness with some movements, and 2) headaches in the top of the the head, tension quality, described as throbbing and tense, mostly in the left temple. She has reported a very prolonged period of HA, almost daily for 2 weeks. 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 Her new HTN medication helped to improve her headaches already.  3) Headaches used to wake her years ago - she was heavier at the time and she used a CPAP but lost weight enough to quit. She reports insomnia : She takes melatonin to sleep, but the last month was difficult. She snores.   Epworth sleepiness score: 7/24. No FSS documented.  BMI: 33.8 kg/m Neck Circumference: 15"  Sleep Summary:   Total Recording Time (hours, min): 8 h 0 min Total Sleep Time (hours, min):  7 h 18 min  Percent REM (%):    28.58%   Respiratory Indices:   Calculated pAHI (per hour):  7.6/hour        REM pAHI:                             20.0/hour      NREM pAHI:                         4.8/hour Supine AHI:                             9.0/hour  Oxygen Saturation Statistics:    Mean Oxygen Saturation (%) :     94%  Minimum oxygen saturation (%):  89%  O2 Saturation Range (%):            89-99%   O2 Saturation (minutes) <89%:    0 min  Pulse  Rate Statistics:   Pulse Mean (bpm):   71 Pulse Range 89-99 bpm   IMPRESSION: There is only very, very mild OSA (obstructive sleep apnea) present, and apnea is restricted to REM sleep , worse in supine position REM sleep.There is neither hypoxia nor an abnormal heart rate noted.   RECOMMENDATION:  I doubt that it will further improve headaches, vertigo or sleep quality to treat such mild apnea. If her spouse is bothered by snoring, I would support a dental vice for the purpose of snoring reduction, it will also reduce the already low apnea index.  Avoiding supine sleep seems to be the most promising approach and weight loss is certainly recommended as well.     INTERPRETING PHYSICIAN:  Larey Seat, MD    [facility] and [facility] Sleep Board certified by The [employer] of Sleep Medicine and Diplomate of the [employer] of Sleep Medicine. Board certified In Neurology through the  [employer], Fellow of the [employer] of Neurology. Medical Director of [employer].  Sleep Summary   Start Study Time: End Study Time: Total Recording Time:  9:54:28 PM 5:55:25 AM 8 h, 0 min  Total Sleep Time % REM of Sleep Time:  7 h, 18 min  28.6     Respiratory Indices     Total Events REM NREM All Night  pRDI: pAHI 3%: ODI 4%: pAHIc 3%: % CSR: pAHI 4%:  57 45  16  0 0.0 13 20.9 20.0 7.3 0.0 7.1 4.8 1.7 0.0 9.6 7.6 2.7 0.0 0.0 2.2     Oxygen Saturation Statistics   Mean: 94 Minimum: 89 Maximum: 99  Mean of Desaturations Nadirs (%):   91  Oxygen Desatur. %:   4-9 10-20 >20 Total  Events Number Total    15  0 93.8 0.0  1 6.3  16 100.0  Oxygen Saturation: <90 <=88 <85 <80 <70  Duration (minutes): Sleep % 0.1 0.0  0.0 0.0  0.0 0.0 0.0 0.0 0.0 0.0     Pulse Rate Statistics during Sleep (BPM)     Mean: 71 Minimum: 50 Maximum: 98        Body Position Statistics  Position Supine Prone Right Left Non-Supine  Sleep (min) 164.5  124.0 0.0 150.4 274.4  Sleep % 37.5 28.2 0.0 34.3 62.5  pRDI 9.9 4.7 N/A 13.7 9.5  pAHI 3% 9.0 3.6 N/A 9.5 6.7  ODI 4% 4.7 1.2 N/A 1.6 1.4     Snoring Statistics Snoring Level (dB) >40 >50 >60 >70 >80 >Threshold (45)  Sleep (min) 15.1 4.6 1.8 0.0 0.0 6.0  Sleep % 3.4 1.0 0.4 0.0 0.0 1.4   Mean: 40 dB

## 2020-10-07 NOTE — Progress Notes (Signed)
IMPRESSION: There is only very, very mild OSA (obstructive sleep apnea) present, and apnea is restricted to REM sleep , worse in supine position REM sleep.There is neither hypoxia nor an abnormal heart rate noted.  RECOMMENDATION:  I doubt that it will further improve headaches, vertigo or sleep quality to treat such mild apnea. If her spouse is bothered by snoring, I would support a dental vice for the purpose of snoring reduction, it will also reduce the already low apnea index.  Avoiding supine sleep seems to be the most promising approach and weight loss is certainly recommended as well.    INTERPRETING PHYSICIAN: Larey Seat, MD

## 2020-10-07 NOTE — Procedures (Signed)
[facility] TEST  REPORT (Watch PAT)  STUDY DATE: 10-04-20  DOB: [birth year]  MRN: [id number]  ORDERING CLINICIAN: Larey Seat, MD   REFERRING CLINICIAN: Carollee Herter, Alferd Apa, DO  CLINICAL INFORMATION/HISTORY: Makayla Good is a 50 y.o. African American female and seen upon consultation referral on 09/05/2020 from Dr Etter Sjogren. 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  She reports 3 problems: 1)Vertigo,  also lightheadedness with some movements, and 2) headaches in the top of the the head, tension quality, described as throbbing and tense, mostly in the left temple. She has reported a very prolonged period of HA, almost daily for 2 weeks. 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 Her new HTN medication helped to improve her headaches already. CT has shown a partially empty sella.  3) Headaches used to wake her years ago - she was heavier at the time and she used a CPAP but lost weight enough to quit. She reports insomnia : She takes melatonin to sleep, but the last month was difficult. She snores.   Epworth sleepiness score: 7/24. No FSS documented.  BMI: 33.8 kg/m Neck Circumference: 15"  Sleep Summary:   Total Recording Time (hours, min): 8 h 0 min Total Sleep Time (hours, min):  7 h 18 min  Percent REM (%):    28.58%   Respiratory Indices:   Calculated pAHI (per hour):  7.6/hour        REM pAHI:                             20.0/hour      NREM pAHI:                         4.8/hour Supine AHI:                             9.0/hour  Oxygen Saturation Statistics:    Mean Oxygen Saturation (%) :     94%  Minimum oxygen saturation (%):  89%  O2 Saturation Range (%):            89-99%   O2 Saturation  (minutes) <89%:    0 min  Pulse Rate Statistics:   Pulse Mean (bpm):   71 Pulse Range 89-99 bpm   IMPRESSION: There is only very, very mild OSA (obstructive sleep apnea) present, and apnea is restricted to REM sleep , worse in supine position REM sleep.There is neither hypoxia nor an abnormal heart rate noted.   RECOMMENDATION:  I doubt that it will further improve headaches, vertigo or sleep quality to treat such mild apnea. If her spouse is bothered by snoring, I would support a dental vice for the purpose of snoring reduction, it will also reduce the already low apnea index.  Avoiding supine sleep seems to be the most promising approach and weight loss is certainly recommended as well.     INTERPRETING PHYSICIAN:  Larey Seat, MD    [facility] and [facility] Sleep Board certified by The [employer] of Sleep Medicine and Diplomate of the [employer] of Sleep Medicine.  Board certified In Neurology through the [place], Fellow of the [employer] of Neurology. Medical Director of [employer].  Sleep Summary   Start Study Time: End Study Time: Total Recording Time:  9:54:28 PM 5:55:25 AM 8 h, 0 min  Total Sleep Time % REM of Sleep Time:  7 h, 18 min  28.6     Respiratory Indices     Total Events REM NREM All Night  pRDI: pAHI 3%: ODI 4%: pAHIc 3%: % CSR: pAHI 4%:  57 45  16  0 0.0 13 20.9 20.0 7.3 0.0 7.1 4.8 1.7 0.0 9.6 7.6 2.7 0.0 0.0 2.2     Oxygen Saturation Statistics   Mean: 94 Minimum: 89 Maximum: 99  Mean of Desaturations Nadirs (%):   91  Oxygen Desatur. %:   4-9 10-20 >20 Total  Events Number Total    15  0 93.8 0.0  1 6.3  16 100.0  Oxygen Saturation: <90 <=88 <85 <80 <70  Duration (minutes): Sleep % 0.1 0.0  0.0 0.0  0.0 0.0 0.0 0.0 0.0 0.0     Pulse Rate Statistics during Sleep (BPM)     Mean: 71 Minimum: 50 Maximum: 98        Body Position Statistics  Position Supine Prone Right Left  Non-Supine  Sleep (min) 164.5 124.0 0.0 150.4 274.4  Sleep % 37.5 28.2 0.0 34.3 62.5  pRDI 9.9 4.7 N/A 13.7 9.5  pAHI 3% 9.0 3.6 N/A 9.5 6.7  ODI 4% 4.7 1.2 N/A 1.6 1.4     Snoring Statistics Snoring Level (dB) >40 >50 >60 >70 >80 >Threshold (45)  Sleep (min) 15.1 4.6 1.8 0.0 0.0 6.0  Sleep % 3.4 1.0 0.4 0.0 0.0 1.4   Mean: 40 dB

## 2020-10-10 ENCOUNTER — Encounter: Payer: Self-pay | Admitting: Neurology

## 2020-10-10 ENCOUNTER — Telehealth: Payer: Self-pay | Admitting: Neurology

## 2020-10-10 NOTE — Telephone Encounter (Signed)
Called patient to discuss sleep study results. No answer at this time. LVM for the patient to call back.  Will send a mychart message as well. 

## 2020-10-10 NOTE — Telephone Encounter (Signed)
-----   Message from Larey Seat, MD sent at 10/07/2020  4:48 PM EDT ----- IMPRESSION: There is only very, very mild OSA (obstructive sleep apnea) present, and apnea is restricted to REM sleep , worse in supine position REM sleep.There is neither hypoxia nor an abnormal heart rate noted.  RECOMMENDATION:  I doubt that it will further improve headaches, vertigo or sleep quality to treat such mild apnea. If her spouse is bothered by snoring, I would support a dental vice for the purpose of snoring reduction, it will also reduce the already low apnea index.  Avoiding supine sleep seems to be the most promising approach and weight loss is certainly recommended as well.    INTERPRETING PHYSICIAN: Larey Seat, MD

## 2020-10-11 ENCOUNTER — Other Ambulatory Visit: Payer: Self-pay

## 2020-10-11 ENCOUNTER — Ambulatory Visit (INDEPENDENT_AMBULATORY_CARE_PROVIDER_SITE_OTHER): Payer: BC Managed Care – PPO | Admitting: Family Medicine

## 2020-10-11 VITALS — BP 136/80 | HR 92 | Temp 98.2°F | Resp 18 | Ht 65.0 in | Wt 200.2 lb

## 2020-10-11 DIAGNOSIS — Z0001 Encounter for general adult medical examination with abnormal findings: Secondary | ICD-10-CM

## 2020-10-11 DIAGNOSIS — Z0000 Encounter for general adult medical examination without abnormal findings: Secondary | ICD-10-CM

## 2020-10-11 DIAGNOSIS — Z1159 Encounter for screening for other viral diseases: Secondary | ICD-10-CM

## 2020-10-11 DIAGNOSIS — R9389 Abnormal findings on diagnostic imaging of other specified body structures: Secondary | ICD-10-CM | POA: Diagnosis not present

## 2020-10-11 DIAGNOSIS — E785 Hyperlipidemia, unspecified: Secondary | ICD-10-CM | POA: Diagnosis not present

## 2020-10-11 DIAGNOSIS — I10 Essential (primary) hypertension: Secondary | ICD-10-CM

## 2020-10-11 DIAGNOSIS — R42 Dizziness and giddiness: Secondary | ICD-10-CM | POA: Diagnosis not present

## 2020-10-11 DIAGNOSIS — F419 Anxiety disorder, unspecified: Secondary | ICD-10-CM

## 2020-10-11 DIAGNOSIS — E042 Nontoxic multinodular goiter: Secondary | ICD-10-CM | POA: Diagnosis not present

## 2020-10-11 MED ORDER — ESCITALOPRAM OXALATE 10 MG PO TABS: 10.0000 mg | ORAL_TABLET | Freq: Every day | ORAL | 2 refills | Status: DC

## 2020-10-11 MED ORDER — MECLIZINE HCL 12.5 MG PO TABS
12.5000 mg | ORAL_TABLET | Freq: Three times a day (TID) | ORAL | 0 refills | Status: DC | PRN
Start: 2020-10-11 — End: 2021-12-08

## 2020-10-11 NOTE — Patient Instructions (Signed)
Preventive Care 50-50 Years Old, Female Preventive care refers to lifestyle choices and visits with your health care provider that can promote health and wellness. This includes: A yearly physical exam. This is also called an annual wellness visit. Regular dental and eye exams. Immunizations. Screening for certain conditions. Healthy lifestyle choices, such as: Eating a healthy diet. Getting regular exercise. Not using drugs or products that contain nicotine and tobacco. Limiting alcohol use. What can I expect for my preventive care visit? Physical exam Your health care provider will check your: Height and weight. These may be used to calculate your BMI (body mass index). BMI is a measurement that tells if you are at a healthy weight. Heart rate and blood pressure. Body temperature. Skin for abnormal spots. Counseling Your health care provider may ask you questions about your: Past medical problems. Family's medical history. Alcohol, tobacco, and drug use. Emotional well-being. Home life and relationship well-being. Sexual activity. Diet, exercise, and sleep habits. Work and work Statistician. Access to firearms. Method of birth control. Menstrual cycle. Pregnancy history. What immunizations do I need?  Vaccines are usually given at various ages, according to a schedule. Your health care provider will recommend vaccines for you based on your age, medicalhistory, and lifestyle or other factors, such as travel or where you work. What tests do I need? Blood tests Lipid and cholesterol levels. These may be checked every 5 years, or more often if you are over 50 years old. Hepatitis C test. Hepatitis B test. Screening Lung cancer screening. You may have this screening every year starting at age 30 if you have a 30-pack-year history of smoking and currently smoke or have quit within the past 15 years. Colorectal cancer screening. All adults should have this screening starting at  age 23 and continuing until age 3. Your health care provider may recommend screening at age 50 if you are at increased risk. You will have tests every 1-10 years, depending on your results and the type of screening test. Diabetes screening. This is done by checking your blood sugar (glucose) after you have not eaten for a while (fasting). You may have this done every 1-3 years. Mammogram. This may be done every 1-2 years. Talk with your health care provider about when you should start having regular mammograms. This may depend on whether you have a family history of breast cancer. BRCA-related cancer screening. This may be done if you have a family history of breast, ovarian, tubal, or peritoneal cancers. Pelvic exam and Pap test. This may be done every 3 years starting at age 79. Starting at age 54, this may be done every 5 years if you have a Pap test in combination with an HPV test. Other tests STD (sexually transmitted disease) testing, if you are at risk. Bone density scan. This is done to screen for osteoporosis. You may have this scan if you are at high risk for osteoporosis. Talk with your health care provider about your test results, treatment options,and if necessary, the need for more tests. Follow these instructions at home: Eating and drinking  Eat a diet that includes fresh fruits and vegetables, whole grains, lean protein, and low-fat dairy products. Take vitamin and mineral supplements as recommended by your health care provider. Do not drink alcohol if: Your health care provider tells you not to drink. You are pregnant, may be pregnant, or are planning to become pregnant. If you drink alcohol: Limit how much you have to 0-1 drink a day. Be aware  of how much alcohol is in your drink. In the [place] one drink equals one 12 oz bottle of beer (355 mL), one 5 oz glass of wine (148 mL), or one 1 oz glass of hard liquor (44 mL).  Lifestyle Take daily care of your teeth and  gums. Brush your teeth every morning and night with fluoride toothpaste. Floss one time each day. Stay active. Exercise for at least 30 minutes 5 or more days each week. Do not use any products that contain nicotine or tobacco, such as cigarettes, e-cigarettes, and chewing tobacco. If you need help quitting, ask your health care provider. Do not use drugs. If you are sexually active, practice safe sex. Use a condom or other form of protection to prevent STIs (sexually transmitted infections). If you do not wish to become pregnant, use a form of birth control. If you plan to become pregnant, see your health care provider for a prepregnancy visit. If told by your health care provider, take low-dose aspirin daily starting at age 50. Find healthy ways to cope with stress, such as: Meditation, yoga, or listening to music. Journaling. Talking to a trusted person. Spending time with friends and family. Safety Always wear your seat belt while driving or riding in a vehicle. Do not drive: If you have been drinking alcohol. Do not ride with someone who has been drinking. When you are tired or distracted. While texting. Wear a helmet and other protective equipment during sports activities. If you have firearms in your house, make sure you follow all gun safety procedures. What's next? Visit your health care provider once a year for an annual wellness visit. Ask your health care provider how often you should have your eyes and teeth checked. Stay up to date on all vaccines. This information is not intended to replace advice given to you by your health care provider. Make sure you discuss any questions you have with your healthcare provider. Document Revised: 01/12/2020 Document Reviewed: 12/19/2017 Elsevier Patient Education  2022 [employer].

## 2020-10-11 NOTE — Progress Notes (Signed)
Subjective:   By signing my name below, I, Makayla Good, attest that this documentation has been prepared under the direction and in the presence of Dr. Roma Schanz, DO. 10/11/2020      Patient ID: Makayla Good, female    DOB: [birth year], 50 y.o.   MRN: [id number]  Chief Complaint  Patient presents with   Annual Exam    Hep C screen due Sees Gyn Concerns/questions:Thyroid finding on recent CT    HPI Patient is in today for a comprehensive physical exam. She is requesting a refill on 12.5 meclizine 3x daily PRN. She was admitted to the ED on 09/30/2020 for pain in both upper extremities. She has neck pain that cause both her arms to go numb. She did not have an episode of this pain during this visit. Her CT scan showed an abnormality in her thyroid and she has a follow up appointment to get it further evaluated. She also notes her anxiety has progressively worsened. She does not see her counselor a this time.  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 She does not have any symptoms at this time that bother her. She notes that she continues to have occasional episodes of dizziness.  She continues to see her GYN, dr. Janne Good. She is willing to get the hepatitis C screening. She is UTD on vision care. She is due on dental care. She has 2 pfizer Covid-19 vaccines at this time.  Past Medical History:  Diagnosis Date   Asthma    Eczema    Fibroid    H/O oophorectomy    Hypertension    Obesity    Polycystic ovary    Left   Sleep apnea    no CPAP per MD    Past Surgical History:  Procedure Laterality Date   BACK SURGERY     85yrs ago   BREAST BIOPSY Right 2019   fibroadenoma   CESAREAN SECTION  02/01/2005   CESAREAN SECTION  2001   LAPAROSCOPIC GASTRIC SLEEVE RESECTION N/A 07/29/2017   Procedure: LAPAROSCOPIC GASTRIC SLEEVE RESECTION WITH UPPER ENDO ;  Surgeon: Makayla Riley, MD;  Location: [facility];  Service: General;  Laterality: N/A;    OOPHORECTOMY     Rt.removed   TUBAL LIGATION  02/01/2005    Family History  Problem Relation Age of Onset   Hypertension Mother    Schizophrenia Mother    Dementia Mother    Mental illness Mother        schizophrenia, dementia   Hypertension Father    Coronary artery disease Father        Stent   Alzheimer's disease Father    Hypertension Sister    Hypertension Sister    Arthritis Sister        rheumatoid   Dementia Maternal Aunt    Kidney disease Other    Colon cancer Neg Hx    Stomach cancer Neg Hx    Pancreatic cancer Neg Hx    Esophageal cancer Neg Hx    Rectal cancer Neg Hx     Social History   Socioeconomic History   Marital status: Married    Spouse name: Not on file   Number of children: Not on file   Years of education: 13   Highest education level: Not on file  Occupational History   Occupation: release liens    Employer: [employer]  Tobacco Use   Smoking status: Former  Packs/day: 0.50    Years: 30.00    Pack years: 15.00    Types: Cigarettes    Start date: 04/28/2020   Smokeless tobacco: Never   Tobacco comments:    will try chantix   Vaping Use   Vaping Use: Some days  Substance and Sexual Activity   Alcohol use: Not Currently   Drug use: No   Sexual activity: Not Currently    Partners: Male    Birth control/protection: Surgical    Comment: Tubal/been with same person over 20 years  Other Topics Concern   Not on file  Social History Narrative   Exercise--gym, every other day--- at least 3 days a week   Social Determinants of Health   Financial Resource Strain: Not on file  Food Insecurity: Not on file  Transportation Needs: Not on file  Physical Activity: Not on file  Stress: Not on file  Social Connections: Not on file  Intimate Partner Violence: Not on file    Outpatient Medications Prior to Visit  Medication Sig Dispense Refill   amLODipine (NORVASC) 5 MG tablet Take 1 tablet (5 mg total) by mouth daily. 90 tablet 3    ASHWAGANDHA PO Take 1 tablet by mouth daily.     Biotin w/ Vitamins C & E (HAIR/SKIN/NAILS PO) Take 1 tablet by mouth daily.     famotidine (PEPCID) 20 MG tablet Take 1 tablet (20 mg total) by mouth 2 (two) times daily. 30 tablet 0   hydrochlorothiazide (HYDRODIURIL) 25 MG tablet TAKE 1 TABLET BY MOUTH EVERY DAY 30 tablet 2   Multiple Vitamins-Minerals (CELEBRATE MULTI-COMPLETE 36 PO) Take 1 each by mouth daily.     nicotine (NICODERM CQ - DOSED IN MG/24 HOURS) 14 mg/24hr patch Place 1 patch (14 mg total) onto the skin daily. 28 patch 1   omeprazole (PRILOSEC) 20 MG capsule Take 1 capsule (20 mg total) by mouth 2 (two) times daily before a meal. 30 capsule 0   potassium chloride (KLOR-CON) 10 MEQ tablet Take 1 tablet (10 mEq total) by mouth daily. 90 tablet 1   meclizine (ANTIVERT) 12.5 MG tablet Take 1 tablet (12.5 mg total) by mouth 3 (three) times daily as needed for dizziness. 30 tablet 0   No facility-administered medications prior to visit.    No Known Allergies  Review of Systems  Constitutional:  Negative for fever and malaise/fatigue.  HENT:  Negative for congestion.   Eyes:  Negative for blurred vision.  Respiratory:  Negative for shortness of breath.   Cardiovascular:  Negative for chest pain, palpitations and leg swelling.  Gastrointestinal:  Negative for abdominal pain, blood in stool and nausea.  Genitourinary:  Negative for dysuria and frequency.  Musculoskeletal:  Positive for neck pain. Negative for falls.  Skin:  Negative for rash.  Neurological:  Negative for dizziness, loss of consciousness and headaches.  Endo/Heme/Allergies:  Negative for environmental allergies.  Psychiatric/Behavioral:  Negative for depression. The patient is nervous/anxious.       Objective:    Physical Exam Vitals and nursing note reviewed.  Constitutional:      General: She is not in acute distress.    Appearance: Normal appearance. She is not ill-appearing.  HENT:     Head:  Normocephalic and atraumatic.     Right Ear: Tympanic membrane, ear canal and external ear normal.     Left Ear: Tympanic membrane, ear canal and external ear normal.  Eyes:     Extraocular Movements: Extraocular movements intact.  Pupils: Pupils are equal, round, and reactive to light.  Cardiovascular:     Rate and Rhythm: Normal rate and regular rhythm.     Pulses: Normal pulses.     Heart sounds: Normal heart sounds. No murmur heard.   No gallop.  Pulmonary:     Effort: Pulmonary effort is normal. No respiratory distress.     Breath sounds: Normal breath sounds. No wheezing, rhonchi or rales.  Abdominal:     General: Bowel sounds are normal. There is no distension.     Palpations: Abdomen is soft. There is no mass.     Tenderness: There is no abdominal tenderness. There is no guarding or rebound.     Hernia: No hernia is present.  Skin:    General: Skin is warm and dry.  Neurological:     Mental Status: She is alert and oriented to person, place, and time.  Psychiatric:        Mood and Affect: Mood is anxious.        Behavior: Behavior normal.    BP 136/80 (BP Location: Left Arm, Patient Position: Sitting, Cuff Size: Large)   Pulse 92   Temp 98.2 F (36.8 C) (Oral)   Resp 18   Ht 5\' 5"  (1.651 m)   Wt 200 lb 3.2 oz (90.8 kg)   SpO2 99%   BMI 33.32 kg/m  Wt Readings from Last 3 Encounters:  10/11/20 200 lb 3.2 oz (90.8 kg)  09/30/20 201 lb 15.1 oz (91.6 kg)  09/07/20 202 lb (91.6 kg)    Diabetic Foot Exam - Simple   No data filed    Good Results  Component Value Date   WBC 5.8 10/11/2020   HGB 13.1 10/11/2020   HCT 38.7 10/11/2020   PLT 204.0 10/11/2020   GLUCOSE 86 10/11/2020   CHOL 203 (H) 10/11/2020   TRIG 155.0 (H) 10/11/2020   HDL 61.20 10/11/2020   LDLCALC 110 (H) 10/11/2020   ALT 25 10/11/2020   AST 20 10/11/2020   NA 144 10/11/2020   K 3.6 10/11/2020   CL 104 10/11/2020   CREATININE 0.71 10/11/2020   BUN 14 10/11/2020   CO2 30 10/11/2020    TSH 0.99 10/11/2020   HGBA1C 5.6 05/05/2020   MICROALBUR <0.7 01/23/2016    Good Results  Component Value Date   TSH 0.99 10/11/2020   Good Results  Component Value Date   WBC 5.8 10/11/2020   HGB 13.1 10/11/2020   HCT 38.7 10/11/2020   MCV 96.9 10/11/2020   PLT 204.0 10/11/2020   Good Results  Component Value Date   NA 144 10/11/2020   K 3.6 10/11/2020   CO2 30 10/11/2020   GLUCOSE 86 10/11/2020   BUN 14 10/11/2020   CREATININE 0.71 10/11/2020   BILITOT 0.4 10/11/2020   ALKPHOS 61 10/11/2020   AST 20 10/11/2020   ALT 25 10/11/2020   PROT 7.1 10/11/2020   ALBUMIN 4.4 10/11/2020   CALCIUM 10.1 10/11/2020   ANIONGAP 8 09/30/2020   GFR 99.48 10/11/2020   Good Results  Component Value Date   CHOL 203 (H) 10/11/2020   Good Results  Component Value Date   HDL 61.20 10/11/2020   Good Results  Component Value Date   LDLCALC 110 (H) 10/11/2020   Good Results  Component Value Date   TRIG 155.0 (H) 10/11/2020   Good Results  Component Value Date   CHOLHDL 3 10/11/2020   Good Results  Component Value Date   HGBA1C  5.6 05/05/2020   Mammogram- Last completed 12/06/2020. Results normal. She has a upcoming mammogram scheduled 12/07/2020. Pap smear- Last completed 05/22/2018. Results normal. Repeat in 3 years.  Colonoscopy- Last completed 04/08/2019. Results showed one less than 1 mm polyp in the rectum removed, mild diverticulosis in the sigmoid colon and transverse colon and ascending colon and cecum, non-bleeding internal hemorrhoids, otherwise results are normal. Repeat in 5-10 years depending on results.     Assessment & Plan:   Problem List Items Addressed This Visit       Unprioritized   Anxiety    Stable con't meds       Relevant Medications   escitalopram (LEXAPRO) 10 MG tablet   Hyperlipidemia LDL goal <100    Encourage heart healthy diet such as MIND or DASH diet, increase exercise, avoid trans fats, simple carbohydrates and processed foods, consider a  krill or fish or flaxseed oil cap daily.        Preventative health care - Primary    ghm utd Check labs See avs       Relevant Orders   Lipid panel (Completed)   CBC with Differential/Platelet (Completed)   Comprehensive metabolic panel (Completed)   Primary hypertension    Well controlled, no changes to meds. Encouraged heart healthy diet such as the DASH diet and exercise as tolerated.        Other Visit Diagnoses     Vertigo       Relevant Medications   meclizine (ANTIVERT) 12.5 MG tablet   Abnormal CT scan, neck       Relevant Orders   US THYROID   Thyroid Panel With TSH (Completed)   Need for hepatitis C screening test       Relevant Orders   Hepatitis C antibody (Completed)        Meds ordered this encounter  Medications   meclizine (ANTIVERT) 12.5 MG tablet    Sig: Take 1 tablet (12.5 mg total) by mouth 3 (three) times daily as needed for dizziness.    Dispense:  30 tablet    Refill:  0   escitalopram (LEXAPRO) 10 MG tablet    Sig: Take 1 tablet (10 mg total) by mouth daily.    Dispense:  30 tablet    Refill:  2    I, Dr. Roma Schanz, DO, personally preformed the services described in this documentation.  All medical record entries made by the scribe were at my direction and in my presence.  I have reviewed the chart and discharge instructions (if applicable) and agree that the record reflects my personal performance and is accurate and complete. 10/11/2020   I,Makayla Good,acting as a Education administrator for [employer], DO.,have documented all relevant documentation on the behalf of [name], DO,as directed by  [name], DO while in the presence of [name], DO.   [name], DO

## 2020-10-12 ENCOUNTER — Other Ambulatory Visit: Payer: Self-pay | Admitting: Family Medicine

## 2020-10-12 DIAGNOSIS — E785 Hyperlipidemia, unspecified: Secondary | ICD-10-CM

## 2020-10-12 LAB — CBC WITH DIFFERENTIAL/PLATELET
Basophils Absolute: 0.1 10*3/uL (ref 0.0–0.1)
Basophils Relative: 1 % (ref 0.0–3.0)
Eosinophils Absolute: 0.1 10*3/uL (ref 0.0–0.7)
Eosinophils Relative: 2.1 % (ref 0.0–5.0)
HCT: 38.7 % (ref 36.0–46.0)
Hemoglobin: 13.1 g/dL (ref 12.0–15.0)
Lymphocytes Relative: 39.7 % (ref 12.0–46.0)
Lymphs Abs: 2.3 10*3/uL (ref 0.7–4.0)
MCHC: 34 g/dL (ref 30.0–36.0)
MCV: 96.9 fl (ref 78.0–100.0)
Monocytes Absolute: 0.4 10*3/uL (ref 0.1–1.0)
Monocytes Relative: 6.1 % (ref 3.0–12.0)
Neutro Abs: 3 10*3/uL (ref 1.4–7.7)
Neutrophils Relative %: 51.1 % (ref 43.0–77.0)
Platelets: 204 10*3/uL (ref 150.0–400.0)
RBC: 3.99 Mil/uL (ref 3.87–5.11)
RDW: 12.9 % (ref 11.5–15.5)
WBC: 5.8 10*3/uL (ref 4.0–10.5)

## 2020-10-12 LAB — COMPREHENSIVE METABOLIC PANEL
ALT: 25 U/L (ref 0–35)
AST: 20 U/L (ref 0–37)
Albumin: 4.4 g/dL (ref 3.5–5.2)
Alkaline Phosphatase: 61 U/L (ref 39–117)
BUN: 14 mg/dL (ref 6–23)
CO2: 30 mEq/L (ref 19–32)
Calcium: 10.1 mg/dL (ref 8.4–10.5)
Chloride: 104 mEq/L (ref 96–112)
Creatinine, Ser: 0.71 mg/dL (ref 0.40–1.20)
GFR: 99.48 mL/min (ref >60.00)
Glucose, Bld: 86 mg/dL (ref 70–99)
Potassium: 3.6 mEq/L (ref 3.5–5.1)
Sodium: 144 mEq/L (ref 135–145)
Total Bilirubin: 0.4 mg/dL (ref 0.2–1.2)
Total Protein: 7.1 g/dL (ref 6.0–8.3)

## 2020-10-12 LAB — HEPATITIS C ANTIBODY
Hepatitis C Ab: NONREACTIVE
SIGNAL TO CUT-OFF: 0.01 (ref <1.00)

## 2020-10-12 LAB — LIPID PANEL
Cholesterol: 203 mg/dL — ABNORMAL HIGH (ref 0–200)
HDL: 61.2 mg/dL (ref >39.00)
LDL Cholesterol: 110 mg/dL — ABNORMAL HIGH (ref 0–99)
NonHDL: 141.35
Total CHOL/HDL Ratio: 3
Triglycerides: 155 mg/dL — ABNORMAL HIGH (ref 0.0–149.0)
VLDL: 31 mg/dL (ref 0.0–40.0)

## 2020-10-12 LAB — THYROID PANEL WITH TSH
Free Thyroxine Index: 2.4 (ref 1.4–3.8)
T3 Uptake: 29 % (ref 22–35)
T4, Total: 8.2 ug/dL (ref 5.1–11.9)
TSH: 0.99 mIU/L

## 2020-10-14 ENCOUNTER — Other Ambulatory Visit: Payer: Self-pay

## 2020-10-14 ENCOUNTER — Encounter: Payer: Self-pay | Admitting: Family Medicine

## 2020-10-14 ENCOUNTER — Ambulatory Visit (HOSPITAL_BASED_OUTPATIENT_CLINIC_OR_DEPARTMENT_OTHER)
Admission: RE | Admit: 2020-10-14 | Discharge: 2020-10-14 | Disposition: A | Discharge status: Home / Self Care | Payer: BC Managed Care – PPO | Source: Ambulatory Visit | Attending: Family Medicine | Admitting: Family Medicine

## 2020-10-14 DIAGNOSIS — E042 Nontoxic multinodular goiter: Secondary | ICD-10-CM | POA: Diagnosis not present

## 2020-10-14 DIAGNOSIS — F419 Anxiety disorder, unspecified: Secondary | ICD-10-CM | POA: Insufficient documentation

## 2020-10-14 DIAGNOSIS — R9389 Abnormal findings on diagnostic imaging of other specified body structures: Secondary | ICD-10-CM | POA: Diagnosis not present

## 2020-10-14 NOTE — Assessment & Plan Note (Signed)
Encourage heart healthy diet such as MIND or DASH diet, increase exercise, avoid trans fats, simple carbohydrates and processed foods, consider a krill or fish or flaxseed oil cap daily.  °

## 2020-10-14 NOTE — Assessment & Plan Note (Signed)
ghm utd Check labs  See avs  

## 2020-10-14 NOTE — Assessment & Plan Note (Signed)
Stable con't meds 

## 2020-10-14 NOTE — Assessment & Plan Note (Signed)
Well controlled, no changes to meds. Encouraged heart healthy diet such as the DASH diet and exercise as tolerated.  °

## 2020-10-17 ENCOUNTER — Other Ambulatory Visit: Payer: Self-pay

## 2020-10-17 DIAGNOSIS — E042 Nontoxic multinodular goiter: Secondary | ICD-10-CM

## 2020-10-25 ENCOUNTER — Encounter: Payer: Self-pay | Admitting: Family Medicine

## 2020-10-25 DIAGNOSIS — E042 Nontoxic multinodular goiter: Secondary | ICD-10-CM | POA: Insufficient documentation

## 2020-11-02 ENCOUNTER — Other Ambulatory Visit: Payer: Self-pay | Admitting: Family Medicine

## 2020-11-02 DIAGNOSIS — F419 Anxiety disorder, unspecified: Secondary | ICD-10-CM

## 2020-11-08 ENCOUNTER — Encounter: Payer: Self-pay | Admitting: Family Medicine

## 2020-11-08 ENCOUNTER — Telehealth (INDEPENDENT_AMBULATORY_CARE_PROVIDER_SITE_OTHER): Payer: BC Managed Care – PPO | Admitting: Family Medicine

## 2020-11-08 ENCOUNTER — Other Ambulatory Visit: Payer: Self-pay

## 2020-11-08 DIAGNOSIS — J029 Acute pharyngitis, unspecified: Secondary | ICD-10-CM | POA: Diagnosis not present

## 2020-11-08 MED ORDER — FLUTICASONE PROPIONATE 50 MCG/ACT NA SUSP: 2.0000 | Freq: Every day | NASAL | 6 refills | Status: DC

## 2020-11-08 MED ORDER — AMOXICILLIN 875 MG PO TABS: 875.0000 mg | ORAL_TABLET | Freq: Two times a day (BID) | ORAL | 0 refills | Status: AC

## 2020-11-08 NOTE — Progress Notes (Signed)
MyChart Video Visit    Virtual Visit via Video Note   This visit type was conducted due to national recommendations for restrictions regarding the COVID-19 Pandemic (e.g. social distancing) in an effort to limit this patient's exposure and mitigate transmission in our community. This patient is at least at moderate risk for complications without adequate follow up. This format is felt to be most appropriate for this patient at this time. Physical exam was limited by quality of the video and audio technology used for the visit. [name] was able to get the patient set up on a video visit.  Patient location: Home Patient and provider in visit Provider location: Office  I discussed the limitations of evaluation and management by telemedicine and the availability of in person appointments. The patient expressed understanding and agreed to proceed.  Visit Date: 11/08/2020  Today's healthcare provider: [name], DO     Subjective:    Patient ID: Makayla Good, female    DOB: May 01, 1970, 50 y.o.   MRN: [id number]  Chief Complaint  Patient presents with   Sore Throat    Pt states sxs started 2 weeks, Pt states having soreness and burning with swallowing,bi lateral ear pain. Pt states negative COVID test last week.     HPI Patient is in today for a video visit.   She c/o sore throat x 3 weeks.  + b/l ear pain   + neg covid test last week.  No fevers, no exposure to strep.   No otc meds    Past Medical History:  Diagnosis Date   Asthma    Eczema    Fibroid    H/O oophorectomy    Hypertension    Obesity    Polycystic ovary    Left   Sleep apnea    no CPAP per MD    Past Surgical History:  Procedure Laterality Date   BACK SURGERY     58yrs ago   BREAST BIOPSY Right 2019   fibroadenoma   CESAREAN SECTION  02/01/2005   CESAREAN SECTION  2001   LAPAROSCOPIC GASTRIC SLEEVE RESECTION N/A 07/29/2017   Procedure: LAPAROSCOPIC GASTRIC SLEEVE  RESECTION WITH UPPER ENDO ;  Surgeon: [name], MD;  Location: [facility];  Service: General;  Laterality: N/A;   OOPHORECTOMY     Rt.removed   TUBAL LIGATION  02/01/2005    Family History  Problem Relation Age of Onset   Hypertension Mother    Schizophrenia Mother    Dementia Mother    Mental illness Mother        schizophrenia, dementia   Hypertension Father    Coronary artery disease Father        Stent   Alzheimer's disease Father    Hypertension Sister    Hypertension Sister    Arthritis Sister        rheumatoid   Dementia Maternal Aunt    Kidney disease Other    Colon cancer Neg Hx    Stomach cancer Neg Hx    Pancreatic cancer Neg Hx    Esophageal cancer Neg Hx    Rectal cancer Neg Hx     Social History   Socioeconomic History   Marital status: Married    Spouse name: Not on file   Number of children: Not on file   Years of education: 13   Highest education level: Not on file  Occupational History   Occupation: release liens  Employer: [employer]  Tobacco Use   Smoking status: Former    Packs/day: 0.50    Years: 30.00    Pack years: 15.00    Types: Cigarettes    Start date: 04/28/2020   Smokeless tobacco: Never   Tobacco comments:    will try chantix   Vaping Use   Vaping Use: Some days  Substance and Sexual Activity   Alcohol use: Not Currently   Drug use: No   Sexual activity: Not Currently    Partners: Male    Birth control/protection: Surgical    Comment: Tubal/been with same person over 20 years  Other Topics Concern   Not on file  Social History Narrative   Exercise--gym, every other day--- at least 3 days a week   Social Determinants of Health   Financial Resource Strain: Not on file  Food Insecurity: Not on file  Transportation Needs: Not on file  Physical Activity: Not on file  Stress: Not on file  Social Connections: Not on file  Intimate Partner Violence: Not on file    Outpatient Medications Prior to Visit   Medication Sig Dispense Refill   amLODipine (NORVASC) 5 MG tablet Take 1 tablet (5 mg total) by mouth daily. 90 tablet 3   ASHWAGANDHA PO Take 1 tablet by mouth daily.     Biotin w/ Vitamins C & E (HAIR/SKIN/NAILS PO) Take 1 tablet by mouth daily.     escitalopram (LEXAPRO) 10 MG tablet TAKE 1 TABLET BY MOUTH EVERY DAY 90 tablet 1   famotidine (PEPCID) 20 MG tablet Take 1 tablet (20 mg total) by mouth 2 (two) times daily. 30 tablet 0   hydrochlorothiazide (HYDRODIURIL) 25 MG tablet TAKE 1 TABLET BY MOUTH EVERY DAY 30 tablet 2   meclizine (ANTIVERT) 12.5 MG tablet Take 1 tablet (12.5 mg total) by mouth 3 (three) times daily as needed for dizziness. 30 tablet 0   Multiple Vitamins-Minerals (CELEBRATE MULTI-COMPLETE 36 PO) Take 1 each by mouth daily.     nicotine (NICODERM CQ - DOSED IN MG/24 HOURS) 14 mg/24hr patch Place 1 patch (14 mg total) onto the skin daily. 28 patch 1   omeprazole (PRILOSEC) 20 MG capsule Take 1 capsule (20 mg total) by mouth 2 (two) times daily before a meal. 30 capsule 0   potassium chloride (KLOR-CON) 10 MEQ tablet Take 1 tablet (10 mEq total) by mouth daily. 90 tablet 1   No facility-administered medications prior to visit.    No Known Allergies  Review of Systems  Constitutional:  Negative for fever and malaise/fatigue.  HENT:  Positive for congestion and sore throat.   Eyes:  Negative for blurred vision.  Respiratory:  Negative for cough and shortness of breath.   Cardiovascular:  Negative for chest pain, palpitations and leg swelling.  Gastrointestinal:  Negative for vomiting.  Musculoskeletal:  Negative for back pain.  Skin:  Negative for rash.  Neurological:  Negative for loss of consciousness and headaches.      Objective:    Physical Exam Vitals and nursing note reviewed.  Constitutional:      Appearance: She is well-developed.  Neck:     Vascular: No carotid bruit or JVD.  Pulmonary:     Effort: Pulmonary effort is normal.  Chest:     Chest  wall: No tenderness.  Neurological:     General: No focal deficit present.     Mental Status: She is alert and oriented to person, place, and time.  Psychiatric:  Mood and Affect: Mood normal.        Behavior: Behavior normal.    There were no vitals taken for this visit. Wt Readings from Last 3 Encounters:  10/11/20 200 lb 3.2 oz (90.8 kg)  09/30/20 201 lb 15.1 oz (91.6 kg)  09/07/20 202 lb (91.6 kg)    Diabetic Foot Exam - Simple   No data filed    Lab Results  Component Value Date   WBC 5.8 10/11/2020   HGB 13.1 10/11/2020   HCT 38.7 10/11/2020   PLT 204.0 10/11/2020   GLUCOSE 86 10/11/2020   CHOL 203 (H) 10/11/2020   TRIG 155.0 (H) 10/11/2020   HDL 61.20 10/11/2020   LDLCALC 110 (H) 10/11/2020   ALT 25 10/11/2020   AST 20 10/11/2020   NA 144 10/11/2020   K 3.6 10/11/2020   CL 104 10/11/2020   CREATININE 0.71 10/11/2020   BUN 14 10/11/2020   CO2 30 10/11/2020   TSH 0.99 10/11/2020   HGBA1C 5.6 05/05/2020   MICROALBUR <0.7 01/23/2016    Lab Results  Component Value Date   TSH 0.99 10/11/2020   Lab Results  Component Value Date   WBC 5.8 10/11/2020   HGB 13.1 10/11/2020   HCT 38.7 10/11/2020   MCV 96.9 10/11/2020   PLT 204.0 10/11/2020   Lab Results  Component Value Date   NA 144 10/11/2020   K 3.6 10/11/2020   CO2 30 10/11/2020   GLUCOSE 86 10/11/2020   BUN 14 10/11/2020   CREATININE 0.71 10/11/2020   BILITOT 0.4 10/11/2020   ALKPHOS 61 10/11/2020   AST 20 10/11/2020   ALT 25 10/11/2020   PROT 7.1 10/11/2020   ALBUMIN 4.4 10/11/2020   CALCIUM 10.1 10/11/2020   ANIONGAP 8 09/30/2020   GFR 99.48 10/11/2020   Lab Results  Component Value Date   CHOL 203 (H) 10/11/2020   Lab Results  Component Value Date   HDL 61.20 10/11/2020   Lab Results  Component Value Date   LDLCALC 110 (H) 10/11/2020   Lab Results  Component Value Date   TRIG 155.0 (H) 10/11/2020   Lab Results  Component Value Date   CHOLHDL 3 10/11/2020   Lab  Results  Component Value Date   HGBA1C 5.6 05/05/2020       Assessment & Plan:   Problem List Items Addressed This Visit   None Visit Diagnoses     Pharyngitis, unspecified etiology    -  Primary   Relevant Medications   amoxicillin (AMOXIL) 875 MG tablet   fluticasone (FLONASE) 50 MCG/ACT nasal spray      Drink fluids Antihistamine otc daily Consider another covid test F/u prn  Meds ordered this encounter  Medications   amoxicillin (AMOXIL) 875 MG tablet    Sig: Take 1 tablet (875 mg total) by mouth 2 (two) times daily for 10 days.    Dispense:  20 tablet    Refill:  0   fluticasone (FLONASE) 50 MCG/ACT nasal spray    Sig: Place 2 sprays into both nostrils daily.    Dispense:  16 g    Refill:  6    I discussed the assessment and treatment plan with the patient. The patient was provided an opportunity to ask questions and all were answered. The patient agreed with the plan and demonstrated an understanding of the instructions.   The patient was advised to call back or seek an in-person evaluation if the symptoms worsen or if the condition  fails to improve as anticipated.  I provided 20 minutes of face-to-face time during this encounter.   [name], DO [place] [facility] [phone] (phone) [phone] (fax)  [place]

## 2020-12-07 ENCOUNTER — Ambulatory Visit
Admission: RE | Admit: 2020-12-07 | Discharge: 2020-12-07 | Disposition: A | Discharge status: Home / Self Care | Payer: BC Managed Care – PPO | Source: Ambulatory Visit | Attending: Family Medicine | Admitting: Family Medicine

## 2020-12-07 ENCOUNTER — Other Ambulatory Visit: Payer: Self-pay

## 2020-12-07 DIAGNOSIS — Z1231 Encounter for screening mammogram for malignant neoplasm of breast: Secondary | ICD-10-CM

## 2020-12-15 ENCOUNTER — Encounter: Payer: Self-pay | Admitting: Physician Assistant

## 2020-12-15 ENCOUNTER — Other Ambulatory Visit: Payer: Self-pay | Admitting: Family Medicine

## 2020-12-15 ENCOUNTER — Ambulatory Visit: Payer: BC Managed Care – PPO | Admitting: Physician Assistant

## 2020-12-15 DIAGNOSIS — I10 Essential (primary) hypertension: Secondary | ICD-10-CM

## 2020-12-15 DIAGNOSIS — M542 Cervicalgia: Secondary | ICD-10-CM

## 2020-12-15 DIAGNOSIS — M79602 Pain in left arm: Secondary | ICD-10-CM

## 2020-12-15 DIAGNOSIS — R600 Localized edema: Secondary | ICD-10-CM

## 2020-12-15 NOTE — Progress Notes (Signed)
Office Visit Note   Patient: Makayla Good           Date of Birth: June 03, 1970           MRN: [id number] Visit Date: 12/15/2020              Requested by: [address], [place], [place] [address] [place],  [place] [address] PCP: Carollee Herter, Alferd Apa, DO   Assessment & Plan: Visit Diagnoses:  1. Left arm pain   2. Neck pain     Plan: We will repeat her upper extremity EMG nerve conduction study she had this performed last year just on the right side.  These will be bilateral upper extremities rule out carpal tunnel syndrome. Have her back once these studies are available.  Questions were encouraged and answered at length today.  Follow-Up Instructions: Return Follow-up after EMG nerve conduction studies of upper extremities..   Orders:  No orders of the defined types were placed in this encounter.  No orders of the defined types were placed in this encounter.     Procedures: No procedures performed   Clinical Data: No additional findings.   Subjective: Chief Complaint  Patient presents with   Neck - Pain   Left Shoulder - Pain    HPI [name] is 50 year old female who we saw last November for right upper extremity pain.  She underwent EMG nerve conduction studies of the right upper extremity however due to her schedule did not follow-up to go over the results.  EMG nerve conduction studies of the right upper extremity showed mild right median nerve entrapment at the wrist.  She states that the symptoms in the right hand gone away.  She is right-hand dominant.  She is now having right arm pain with numbness tingling that goes into the hand.  She states it affects the whole hand this is not constant.  She also has some tightness and pain in her shoulder and neck.  Occasional numbness in the face.  She did see a neurologist for this and underwent a CT scan of her head which was normal.  She was found to have mild obstructive sleep apnea.  She  recently underwent CT scan of her cervical spine on 09/30/2020.  This showed only some mild degenerative changes at C5-C6 with mild central canal stenosis and neuroforaminal narrowing.  No significant disc protrusion. She has had no known injury to the left arm.  She notes she just has some rare numbness in the right hand at this point time.  She does work on a Psychiatric nurse day.  She is right-hand dominant.  Review of Systems See HPI otherwise negative or noncontributory.  Objective: Vital Signs: There were no vitals taken for this visit.  Physical Exam Constitutional:      Appearance: She is not ill-appearing or diaphoretic.  Pulmonary:     Effort: Pulmonary effort is normal.  Neurological:     Mental Status: She is alert and oriented to person, place, and time.  Psychiatric:        Mood and Affect: Mood normal.    Ortho Exam Cervical spine she has excellent range of motion cervical spine without pain.  Negative Spurling's.  She has any tenderness over the left trapezius region left scapular medial border with palpation.  5 out of 5 strength throughout the upper extremities.  Excellent range of motion bilateral shoulders without pain.  Full range of motion of the  elbows wrist and hands bilaterally without pain.  Positive Tinel's over the median nerve at the wrist on the right.  Negative compression test over the median nerve bilaterally.  Negative Phalen's bilaterally.  Negative Tinel's over the ulnar nerve at the elbow bilaterally.  Subjective normal sensation bilateral hands light touch.  Full motor bilateral hands.  Specialty Comments:  No specialty comments available.  Imaging: No results found.   PMFS History: Patient Active Problem List   Diagnosis Date Noted   Multinodular goiter 10/25/2020   Anxiety 10/14/2020   Increased frequency of headaches 09/05/2020   Abnormal CT of the head 09/05/2020   Loud snoring 09/05/2020   Gastroesophageal reflux disease 05/05/2020    Hyperglycemia 05/05/2020   Lower extremity edema 09/22/2019   Lymphedema 09/22/2019   Primary hypertension 08/05/2017   Hypokalemia 08/05/2017   OSA (obstructive sleep apnea) 03/05/2017   Asthma 03/05/2017   Dizziness 02/08/2017   Preventative health care 02/08/2017   Abdominal pain 07/05/2016   RUQ pain 07/05/2016   Irregular periods 07/26/2014   Dysuria 02/04/2014   Severe obesity (BMI >= 40) (HCC) 10/21/2012   OA (osteoarthritis) of knee 06/04/2012   Cough 03/05/2012   Plantar fasciitis 09/04/2010   HYPOKALEMIA 07/01/2009   CHEST PAIN UNSPECIFIED 05/18/2009   DEPRESSION 03/07/2009   FATIGUE 03/07/2009   TOBACCO USER 12/03/2008   Morbid obesity ([place]) 05/21/2008   Hyperlipidemia LDL goal <100 123456   DYSMETABOLIC SYNDROME X 123456   CONJUNCTIVITIS NOS 02/04/2007   VAGINITIS NOS 11/07/2006   FUNGAL DERMATITIS 10/09/2006   Benign essential HTN 10/09/2006   Past Medical History:  Diagnosis Date   Asthma    Eczema    Fibroid    H/O oophorectomy    Hypertension    Obesity    Polycystic ovary    Left   Sleep apnea    no CPAP per MD    Family History  Problem Relation Age of Onset   Hypertension Mother    Schizophrenia Mother    Dementia Mother    Mental illness Mother        schizophrenia, dementia   Hypertension Father    Coronary artery disease Father        Stent   Alzheimer's disease Father    Hypertension Sister    Hypertension Sister    Arthritis Sister        rheumatoid   Dementia Maternal Aunt    Kidney disease Other    Colon cancer Neg Hx    Stomach cancer Neg Hx    Pancreatic cancer Neg Hx    Esophageal cancer Neg Hx    Rectal cancer Neg Hx     Past Surgical History:  Procedure Laterality Date   BACK SURGERY     70yr ago   BREAST BIOPSY Right 2019   fibroadenoma   CESAREAN SECTION  02/01/2005   CESAREAN SECTION  2001   LAPAROSCOPIC GASTRIC SLEEVE RESECTION N/A 07/29/2017   Procedure: LAPAROSCOPIC GASTRIC SLEEVE RESECTION WITH  UPPER ENDO ;  Surgeon: CClovis Riley MD;  Location: [facility];  Service: General;  Laterality: N/A;   OOPHORECTOMY     Rt.removed   TUBAL LIGATION  02/01/2005   Social History   Occupational History   Occupation: release liens    Employer: [employer]  Tobacco Use   Smoking status: Former    Packs/day: 0.50    Years: 30.00    Pack years: 15.00    Types: Cigarettes    Start  date: 04/28/2020   Smokeless tobacco: Never   Tobacco comments:    will try chantix   Vaping Use   Vaping Use: Some days  Substance and Sexual Activity   Alcohol use: Not Currently   Drug use: No   Sexual activity: Not Currently    Partners: Male    Birth control/protection: Surgical    Comment: Tubal/been with same person over 20 years

## 2020-12-16 ENCOUNTER — Other Ambulatory Visit: Payer: Self-pay

## 2020-12-16 DIAGNOSIS — M79602 Pain in left arm: Secondary | ICD-10-CM

## 2020-12-18 ENCOUNTER — Encounter (HOSPITAL_BASED_OUTPATIENT_CLINIC_OR_DEPARTMENT_OTHER): Payer: Self-pay | Admitting: *Deleted

## 2020-12-18 ENCOUNTER — Other Ambulatory Visit: Payer: Self-pay

## 2020-12-18 DIAGNOSIS — R0789 Other chest pain: Secondary | ICD-10-CM | POA: Diagnosis not present

## 2020-12-18 DIAGNOSIS — R079 Chest pain, unspecified: Secondary | ICD-10-CM | POA: Diagnosis not present

## 2020-12-18 DIAGNOSIS — I10 Essential (primary) hypertension: Secondary | ICD-10-CM | POA: Insufficient documentation

## 2020-12-18 DIAGNOSIS — J45909 Unspecified asthma, uncomplicated: Secondary | ICD-10-CM | POA: Diagnosis not present

## 2020-12-18 DIAGNOSIS — Z87891 Personal history of nicotine dependence: Secondary | ICD-10-CM | POA: Diagnosis not present

## 2020-12-18 DIAGNOSIS — E876 Hypokalemia: Secondary | ICD-10-CM | POA: Diagnosis not present

## 2020-12-18 NOTE — ED Triage Notes (Signed)
Left sided CP x today. Denies SOB, denies N/V.

## 2020-12-19 ENCOUNTER — Emergency Department (HOSPITAL_BASED_OUTPATIENT_CLINIC_OR_DEPARTMENT_OTHER): Payer: BC Managed Care – PPO

## 2020-12-19 ENCOUNTER — Emergency Department (HOSPITAL_BASED_OUTPATIENT_CLINIC_OR_DEPARTMENT_OTHER)
Admission: EM | Admit: 2020-12-19 | Discharge: 2020-12-19 | Disposition: A | Discharge status: Home / Self Care | Payer: BC Managed Care – PPO | Attending: Emergency Medicine | Admitting: Emergency Medicine

## 2020-12-19 DIAGNOSIS — R0789 Other chest pain: Secondary | ICD-10-CM

## 2020-12-19 DIAGNOSIS — R079 Chest pain, unspecified: Secondary | ICD-10-CM | POA: Diagnosis not present

## 2020-12-19 DIAGNOSIS — E876 Hypokalemia: Secondary | ICD-10-CM

## 2020-12-19 LAB — BASIC METABOLIC PANEL
Anion gap: 9 (ref 5–15)
BUN: 13 mg/dL (ref 6–20)
CO2: 28 mmol/L (ref 22–32)
Calcium: 9.4 mg/dL (ref 8.9–10.3)
Chloride: 102 mmol/L (ref 98–111)
Creatinine, Ser: 0.58 mg/dL (ref 0.44–1.00)
GFR, Estimated: >60 mL/min (ref >60)
Glucose, Bld: 108 mg/dL — ABNORMAL HIGH (ref 70–99)
Potassium: 3.2 mmol/L — ABNORMAL LOW (ref 3.5–5.1)
Sodium: 139 mmol/L (ref 135–145)

## 2020-12-19 LAB — CBC
HCT: 38 % (ref 36.0–46.0)
Hemoglobin: 13.1 g/dL (ref 12.0–15.0)
MCH: 33.1 pg (ref 26.0–34.0)
MCHC: 34.5 g/dL (ref 30.0–36.0)
MCV: 96 fL (ref 80.0–100.0)
Platelets: 217 10*3/uL (ref 150–400)
RBC: 3.96 MIL/uL (ref 3.87–5.11)
RDW: 13.2 % (ref 11.5–15.5)
WBC: 6.4 10*3/uL (ref 4.0–10.5)
nRBC: 0 % (ref 0.0–0.2)

## 2020-12-19 LAB — TROPONIN I (HIGH SENSITIVITY)
Troponin I (High Sensitivity): 2 ng/L (ref <18)
Troponin I (High Sensitivity): <2 ng/L (ref <18)

## 2020-12-19 MED ORDER — POTASSIUM CHLORIDE CRYS ER 20 MEQ PO TBCR
40.0000 meq | EXTENDED_RELEASE_TABLET | Freq: Once | ORAL | Status: AC
  Administered 2020-12-19: 40 meq via ORAL
  Filled 2020-12-19: qty 2

## 2020-12-19 NOTE — ED Provider Notes (Signed)
[facility] Provider Note: Georgena Spurling, MD, FACEP  CSN: [id number] MRN: [id number] ARRIVAL: 12/18/20 at 2347 ROOM: [place]  Chest Pain   HISTORY OF PRESENT ILLNESS  12/19/20 1:37 AM Makayla Good is a 50 y.o. female who has been having episodes of chest pain since about 2 PM yesterday afternoon.  The chest pain is located in the left upper chest.  She describes the pain as feeling like a mild pressure (4 out of 10) and only lasts for a second or 2.  Nothing seems to bring it on.  Sometimes belching relieves it.  She is having no associated shortness of breath, nausea or diaphoresis.   Past Medical History:  Diagnosis Date   Asthma    Eczema    Fibroid    H/O oophorectomy    Hypertension    Obesity    Polycystic ovary    Left   Sleep apnea    no CPAP per MD    Past Surgical History:  Procedure Laterality Date   BACK SURGERY     51yr ago   BREAST BIOPSY Right 2019   fibroadenoma   CESAREAN SECTION  02/01/2005   CESAREAN SECTION  2001   LAPAROSCOPIC GASTRIC SLEEVE RESECTION N/A 07/29/2017   Procedure: LAPAROSCOPIC GASTRIC SLEEVE RESECTION WITH UPPER ENDO ;  Surgeon: CClovis Riley MD;  Location: [facility];  Service: General;  Laterality: N/A;   OOPHORECTOMY     Rt.removed   TUBAL LIGATION  02/01/2005    Family History  Problem Relation Age of Onset   Hypertension Mother    Schizophrenia Mother    Dementia Mother    Mental illness Mother        schizophrenia, dementia   Hypertension Father    Coronary artery disease Father        Stent   Alzheimer's disease Father    Hypertension Sister    Hypertension Sister    Arthritis Sister        rheumatoid   Dementia Maternal Aunt    Kidney disease Other    Colon cancer Neg Hx    Stomach cancer Neg Hx    Pancreatic cancer Neg Hx    Esophageal cancer Neg Hx    Rectal cancer Neg Hx     Social History   Tobacco Use   Smoking status: Former    Packs/day: 0.50     Years: 30.00    Pack years: 15.00    Types: Cigarettes    Start date: 04/28/2020   Smokeless tobacco: Never   Tobacco comments:    will try chantix   Vaping Use   Vaping Use: Some days  Substance Use Topics   Alcohol use: Not Currently   Drug use: No    Prior to Admission medications   Medication Sig Start Date End Date Taking? Authorizing Provider  amLODipine (NORVASC) 5 MG tablet Take 1 tablet (5 mg total) by mouth daily. 05/05/20   LRoma SchanzR, DO  ASHWAGANDHA PO Take 1 tablet by mouth daily.    [provider]  Biotin w/ Vitamins C & E (HAIR/SKIN/NAILS PO) Take 1 tablet by mouth daily.    [provider]  escitalopram (LEXAPRO) 10 MG tablet TAKE 1 TABLET BY MOUTH EVERY DAY 11/02/20   LCarollee Herter YAlferd Apa DO  famotidine (PEPCID) 20 MG tablet Take 1 tablet (20 mg total) by mouth 2 (two) times daily. 04/29/20   Wieters, HElesa Hacker  PA-C  fluticasone (FLONASE) 50 MCG/ACT nasal spray Place 2 sprays into both nostrils daily. 11/08/20   Roma Schanz R, DO  hydrochlorothiazide (HYDRODIURIL) 25 MG tablet TAKE 1 TABLET BY MOUTH EVERY DAY 12/15/20   Carollee Herter, Alferd Apa, DO  meclizine (ANTIVERT) 12.5 MG tablet Take 1 tablet (12.5 mg total) by mouth 3 (three) times daily as needed for dizziness. 10/11/20   [name], DO  Multiple Vitamins-Minerals (CELEBRATE MULTI-COMPLETE 36 PO) Take 1 each by mouth daily.    [provider]  nicotine (NICODERM CQ - DOSED IN MG/24 HOURS) 14 mg/24hr patch Place 1 patch (14 mg total) onto the skin daily. 02/23/20   [name], DO  omeprazole (PRILOSEC) 20 MG capsule Take 1 capsule (20 mg total) by mouth 2 (two) times daily before a meal. 04/29/20   Wieters, Hallie C, PA-C  potassium chloride (KLOR-CON) 10 MEQ tablet Take 1 tablet (10 mEq total) by mouth daily. 09/05/20   Dohmeier, Asencion Partridge, MD    Allergies Patient has no known allergies.   REVIEW OF SYSTEMS  Negative except as noted here or in the  History of Present Illness.   PHYSICAL EXAMINATION  Initial Vital Signs Blood pressure (!) 146/97, pulse 78, temperature 98.4 F (36.9 C), temperature source Oral, resp. rate 18, height '5\' 5"'$  (1.651 m), weight 90.7 kg, SpO2 100 %.  Examination General: Well-developed, well-nourished female in no acute distress; appearance consistent with age of record HENT: normocephalic; atraumatic Eyes: Normal appearance Neck: supple Heart: regular rate and rhythm; no murmur Lungs: clear to auscultation bilaterally Chest: Nontender Abdomen: soft; nondistended; nontender; bowel sounds present Extremities: No deformity; full range of motion; pulses normal Neurologic: Awake, alert and oriented; motor function intact in all extremities and symmetric; no facial droop Skin: Warm and dry Psychiatric: Normal mood and affect   RESULTS  Summary of this visit's results, reviewed and interpreted by myself:   EKG Interpretation  Date/Time:  Sunday December 18 2020 23:54:19 EDT Ventricular Rate:  80 PR Interval:  168 QRS Duration: 86 QT Interval:  382 QTC Calculation: 440 R Axis:   42 Text Interpretation: Normal sinus rhythm Normal ECG No significant change was found Confirmed by Suheyla Mortellaro, Jenny Reichmann [phone]) on 12/18/2020 11:58:29 PM       Laboratory Studies: Results for orders placed or performed during the hospital encounter of 12/19/20 (from the past 24 hour(s))  Basic metabolic panel     Status: Abnormal   Collection Time: 12/19/20 12:11 AM  Result Value Ref Range   Sodium 139 135 - 145 mmol/L   Potassium 3.2 (L) 3.5 - 5.1 mmol/L   Chloride 102 98 - 111 mmol/L   CO2 28 22 - 32 mmol/L   Glucose, Bld 108 (H) 70 - 99 mg/dL   BUN 13 6 - 20 mg/dL   Creatinine, Ser 0.58 0.44 - 1.00 mg/dL   Calcium 9.4 8.9 - 10.3 mg/dL   GFR, Estimated >60 >60 mL/min   Anion gap 9 5 - 15  CBC     Status: None   Collection Time: 12/19/20 12:11 AM  Result Value Ref Range   WBC 6.4 4.0 - 10.5 K/uL   RBC 3.96 3.87 - 5.11  MIL/uL   Hemoglobin 13.1 12.0 - 15.0 g/dL   HCT 38.0 36.0 - 46.0 %   MCV 96.0 80.0 - 100.0 fL   MCH 33.1 26.0 - 34.0 pg   MCHC 34.5 30.0 - 36.0 g/dL   RDW 13.2 11.5 -  15.5 %   Platelets 217 150 - 400 K/uL   nRBC 0.0 0.0 - 0.2 %  Troponin I (High Sensitivity)     Status: None   Collection Time: 12/19/20 12:11 AM  Result Value Ref Range   Troponin I (High Sensitivity) 2 <18 ng/L  Troponin I (High Sensitivity)     Status: None   Collection Time: 12/19/20  2:05 AM  Result Value Ref Range   Troponin I (High Sensitivity) <2 <18 ng/L   Imaging Studies: DG Chest 2 View  Result Date: 12/19/2020 CLINICAL DATA:  Left-sided chest pain. EXAM: CHEST - 2 VIEW COMPARISON:  September 30, 2020 FINDINGS: The heart size and mediastinal contours are within normal limits. Both lungs are clear. The visualized skeletal structures are unremarkable. IMPRESSION: No active cardiopulmonary disease. Electronically Signed   By: Virgina Norfolk M.D.   On: 12/19/2020 00:21    ED COURSE and MDM  Nursing notes, initial and subsequent vitals signs, including pulse oximetry, reviewed and interpreted by myself.  Vitals:   12/19/20 0100 12/19/20 0130 12/19/20 0200 12/19/20 0230  BP: 128/88 120/68 (!) 106/94 (!) 141/83  Pulse: 64 62 66 66  Resp: '16 16 16 16  '$ Temp:      TempSrc:      SpO2: 98% 100% 100% 99%  Weight:      Height:       Medications  potassium chloride SA (KLOR-CON) CR tablet 40 mEq (40 mEq Oral Given 12/19/20 0155)   Patient's EKG and chest x-ray are normal and troponins are well within normal limits.  Her chest pain is atypical for cardiac.  I believe it is safe to discharge her home with outpatient follow-up or return if symptoms worsen.  The patient states "I always have low potassium".   PROCEDURES  Procedures   ED DIAGNOSES     ICD-10-CM   1. Atypical chest pain  R07.89     2. Hypokalemia  E87.6          Garlene Apperson, Jenny Reichmann, MD 12/19/20 [phone]

## 2020-12-27 ENCOUNTER — Encounter: Payer: Self-pay | Admitting: Internal Medicine

## 2020-12-27 ENCOUNTER — Other Ambulatory Visit: Payer: Self-pay

## 2020-12-27 ENCOUNTER — Ambulatory Visit (INDEPENDENT_AMBULATORY_CARE_PROVIDER_SITE_OTHER): Payer: BC Managed Care – PPO | Admitting: Internal Medicine

## 2020-12-27 VITALS — BP 126/72 | HR 91 | Ht 65.0 in | Wt 203.0 lb

## 2020-12-27 DIAGNOSIS — E042 Nontoxic multinodular goiter: Secondary | ICD-10-CM | POA: Diagnosis not present

## 2020-12-27 NOTE — Progress Notes (Signed)
Name: Makayla Good  MRN/ DOB: [id number], 1971-01-11    Age/ Sex: 50 y.o., female    PCP: [name], DO   Reason for Endocrinology Evaluation: MNG     Date of Initial Endocrinology Evaluation: 12/27/2020     HPI: [name] is a 50 y.o. female with a past medical history of OSA, MNG and HTN. The patient presented for initial endocrinology clinic visit on 12/27/2020 for consultative assistance with her MNG.   She had an incidental finding of thyroid nodule on cervical CT scan which prompted a thyroid ultrasound demonstrating MNG but none meeting criteria for FNA .    Denies local neck symptoms  No FH of thyroid disease  No prior exposure to radiation  Has anxiety issues   Denies constipation or diarrhea  Has rare palpitations  Denies tremors   HISTORY:  Past Medical History:  Past Medical History:  Diagnosis Date   Asthma    Eczema    Fibroid    H/O oophorectomy    Hypertension    Obesity    Polycystic ovary    Left   Sleep apnea    no CPAP per MD   Past Surgical History:  Past Surgical History:  Procedure Laterality Date   BACK SURGERY     63yr ago   BREAST BIOPSY Right 2019   fibroadenoma   CESAREAN SECTION  02/01/2005   CESAREAN SECTION  2001   LAPAROSCOPIC GASTRIC SLEEVE RESECTION N/A 07/29/2017   Procedure: LAPAROSCOPIC GASTRIC SLEEVE RESECTION WITH UPPER ENDO ;  Surgeon: CClovis Riley MD;  Location: [facility];  Service: General;  Laterality: N/A;   OOPHORECTOMY     Rt.removed   TUBAL LIGATION  02/01/2005    Social History:  reports that she has quit smoking. Her smoking use included cigarettes. She started smoking about 7 months ago. She has a 15.00 pack-year smoking history. She has never used smokeless tobacco. She reports that she does not currently use alcohol. She reports that she does not use drugs. Family History: family history includes Alzheimer's disease in her father; Arthritis in her sister;  Coronary artery disease in her father; Dementia in her maternal aunt and mother; Hypertension in her father, mother, sister, and sister; Kidney disease in an other family member; Mental illness in her mother; Schizophrenia in her mother.   HOME MEDICATIONS: Allergies as of 12/27/2020   No Known Allergies      Medication List        Accurate as of December 27, 2020  3:21 PM. If you have any questions, ask your nurse or doctor.          amLODipine 5 MG tablet Commonly known as: NORVASC Take 1 tablet (5 mg total) by mouth daily.   ASHWAGANDHA PO Take 1 tablet by mouth daily.   CELEBRATE MULTI-COMPLETE 36 PO Take 1 each by mouth daily.   escitalopram 10 MG tablet Commonly known as: LEXAPRO TAKE 1 TABLET BY MOUTH EVERY DAY   famotidine 20 MG tablet Commonly known as: PEPCID Take 1 tablet (20 mg total) by mouth 2 (two) times daily.   fluticasone 50 MCG/ACT nasal spray Commonly known as: FLONASE Place 2 sprays into both nostrils daily.   HAIR/SKIN/NAILS PO Take 1 tablet by mouth daily.   hydrochlorothiazide 25 MG tablet Commonly known as: HYDRODIURIL TAKE 1 TABLET BY MOUTH EVERY DAY   meclizine 12.5 MG tablet Commonly known as: ANTIVERT Take 1 tablet (12.5  mg total) by mouth 3 (three) times daily as needed for dizziness.   nicotine 14 mg/24hr patch Commonly known as: NICODERM CQ - dosed in mg/24 hours Place 1 patch (14 mg total) onto the skin daily.   omeprazole 20 MG capsule Commonly known as: PRILOSEC Take 1 capsule (20 mg total) by mouth 2 (two) times daily before a meal.   potassium chloride 10 MEQ tablet Commonly known as: KLOR-CON Take 1 tablet (10 mEq total) by mouth daily.          REVIEW OF SYSTEMS: A comprehensive ROS was conducted with the patient and is negative except as per HPI    OBJECTIVE:  VS: BP 126/72 (BP Location: Left Arm, Patient Position: Sitting, Cuff Size: Large)   Pulse 91   Ht '5\' 5"'$  (1.651 m)   Wt 203 lb (92.1 kg)   SpO2  98%   BMI 33.78 kg/m    Wt Readings from Last 3 Encounters:  12/18/20 200 lb (90.7 kg)  10/11/20 200 lb 3.2 oz (90.8 kg)  09/30/20 201 lb 15.1 oz (91.6 kg)     EXAM: General: Pt appears well and is in NAD  Neck: General: Supple without adenopathy. Thyroid: Thyroid size normal.  No goiter or nodules appreciated. No thyroid bruit.  Lungs: Clear with good BS bilat with no rales, rhonchi, or wheezes  Heart: Auscultation: RRR.  Abdomen: Normoactive bowel sounds, soft, nontender, without masses or organomegaly palpable  Extremities:  BL LE: No pretibial edema normal ROM and strength.  Skin: Hair: Texture and amount normal with gender appropriate distribution Skin Inspection: No rashes Skin Palpation: Skin temperature, texture, and thickness normal to palpation  Mental Status: Judgment, insight: Intact Orientation: Oriented to time, place, and person Mood and affect: No depression, anxiety, or agitation     DATA REVIEWED: Results for Makayla Good, Makayla Good (MRN [id number]) as of 12/27/2020 15:33  Ref. Range 10/11/2020 14:45  TSH Latest Units: mIU/L 0.99  Thyroxine (T4) Latest Ref Range: 5.1 - 11.9 mcg/dL 8.2  Free Thyroxine Index Latest Ref Range: 1.4 - 3.8  2.4  T3 Uptake Latest Ref Range: 22 - 35 % 29     Thyroid Ultrasound 10/11/2020   CLINICAL DATA:  Incidental on CT. Suspected bilateral thyroid nodules demonstrated on cervical spine CT performed 09/30/2020   EXAM: THYROID ULTRASOUND   TECHNIQUE: Ultrasound examination of the thyroid gland and adjacent soft tissues was performed.   COMPARISON:  Cervical spine CT-09/30/2020   FINDINGS: Parenchymal Echotexture: Normal   Isthmus: Normal in size measuring 0.4 cm in diameter   Right lobe: Borderline enlarged measuring 5.5 x 2.4 x 2.4 cm   Left lobe: Normal in size measuring 4.5 x 2.1 x 2.1 cm   _________________________________________________________   Estimated total number of nodules >/= 1 cm: 1   Number  of spongiform nodules >/=  2 cm not described below (TR1): 0   Number of mixed cystic and solid nodules >/= 1.5 cm not described below (TR2): 0   _________________________________________________________   There is an approximately 0.8 x 0.6 x 0.4 cm hypoechoic nodule within the inferior pole the right lobe of the thyroid (labeled 1), which does not meet criteria to recommend percutaneous sampling or continued dedicated follow-up.   There is an approximately 0.8 cm isoechoic nodule within mid aspect the right lobe of the thyroid (labeled 2), which does not meet criteria to recommend percutaneous sampling or continued dedicated follow-up.   _________________________________________________________   Nodule # 3:   Location:  Right; Mid - correlates with the nodule seen on preceding cervical spine CT   Maximum size: 1.4 cm; Other 2 dimensions: 1.2 x 1.0 cm   Composition: solid/almost completely solid (2)   Echogenicity: isoechoic (1)   Shape: not taller-than-wide (0)   Margins: smooth (0)   Echogenic foci: none (0)   ACR TI-RADS total points: 3.   ACR TI-RADS risk category: TR3 (3 points).   ACR TI-RADS recommendations:   Given size (<1.4 cm) and appearance, this nodule does NOT meet TI-RADS criteria for biopsy or dedicated follow-up.   _________________________________________________________   There is an approximately 1.2 x 1.0 x 0.5 cm isoechoic nodule within the superior pole the right lobe of the thyroid (labeled 4), which does not meet criteria to recommend percutaneous sampling or continued dedicated follow-up.   _________________________________________________________   Nodule # 5:   Location: Right; Superior   Maximum size: 1.1 cm; Other 2 dimensions: 0.9 x 0.5 cm   Composition: solid/almost completely solid (2)   Echogenicity: hypoechoic (2)   Shape: not taller-than-wide (0)   Margins: smooth (0)   Echogenic foci: none (0)   ACR TI-RADS  total points: 4.   ACR TI-RADS risk category: TR4 (4-6 points).   ACR TI-RADS recommendations:   *Given size (>/= 1 - 1.4 cm) and appearance, a follow-up ultrasound in 1 year should be considered based on TI-RADS criteria.   _________________________________________________________   _________________________________________________________   There is an approximately 0.8 x 0.6 x 0.5 cm hypoechoic nodule within the inferior pole the left lobe of the thyroid (labeled 6), correlating with the nodule seen on preceding cervical spine CT, does not meet criteria to recommend percutaneous sampling or continued dedicated follow-up.   IMPRESSION: 1. Borderline thyromegaly with findings suggestive of multinodular goiter. 2. Nodule #5 meets imaging criteria to recommend a 1 year follow-up as clinically indicated.   The above is in keeping with the ACR TI-RADS recommendations - J Am Coll Radiol 2017;14:587-595.   ASSESSMENT/PLAN/RECOMMENDATIONS:   MNG:  - Pt is clinically and bio chemically euthyroid  - No local neck symptoms  -We discussed the risk of malignancy is between 3-5%, none of her thyroid nodules meet criteria for FNA at this time -Reassurance provided   Follow-up in 10 months    Signed electronically by: Mack Guise, MD  [facility] Endocrinology  [address] [place], [place] [address] Phone: [phone] FAX: [phone]   CC: [name], DO [address] [place] [address] Phone: [phone] Fax: [phone]   Return to Endocrinology clinic as below: Future Appointments  Date Time Provider [place]  01/10/2021 10:30 AM Magnus Sinning, MD [facility] None

## 2021-01-10 ENCOUNTER — Other Ambulatory Visit: Payer: Self-pay

## 2021-01-10 ENCOUNTER — Encounter: Payer: Self-pay | Admitting: Physical Medicine and Rehabilitation

## 2021-01-10 ENCOUNTER — Ambulatory Visit (INDEPENDENT_AMBULATORY_CARE_PROVIDER_SITE_OTHER): Payer: BC Managed Care – PPO | Admitting: Physical Medicine and Rehabilitation

## 2021-01-10 DIAGNOSIS — R202 Paresthesia of skin: Secondary | ICD-10-CM | POA: Diagnosis not present

## 2021-01-10 NOTE — Progress Notes (Signed)
Ain in left upper arm. Some tingling in arm and hand. Some tightness when turning neck. Pulling sensation in left upper arm when stretching. Right hand dominant No lotion per patient

## 2021-01-11 NOTE — Progress Notes (Signed)
[name] - 50 y.o. female MRN [id number]  Date of birth: Aug 22, 1970  Office Visit Note: Visit Date: 01/10/2021 PCP: [name], DO Referred by: [name], *  Subjective: Chief Complaint  Patient presents with   Left Hand - Numbness   Left Arm - Pain   HPI:  Makayla Good is a 50 y.o. female who comes in today at the request of Benita Stabile, PA-C for electrodiagnostic study of the Bilateral upper extremities.  Patient is Right hand dominant.  [place] complaints today are left upper arm pain with tingling in the arm and hand in a nondermatomal fashion on the left.  She has some tightness when turning her neck and a pulling sensation in her left arm when stretching.  She also reports some numbness and tingling in the hands bilaterally.  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  I did not show any evidence of radiculopathy radiculitis.  We will repeat the test today on the left and we will recheck right for carpal tunnel again to see if it is worsened.   ROS Otherwise per HPI.  Assessment & Plan: Visit Diagnoses:    ICD-10-CM   1. Paresthesia of skin  R20.2 NCV with EMG (electromyography)      Plan: Impression: The above electrodiagnostic study is ABNORMAL and reveals evidence of a mild right median nerve entrapment at the wrist (carpal tunnel syndrome) affecting sensory components.   There is no significant electrodiagnostic evidence of any other focal nerve entrapment, brachial plexopathy or cervical radiculopathy in either upper limb.   Recommendations: 1.  Follow-up with referring physician. 2.  Continue current management of symptoms. 3.  Continue use of resting splint at night-time and as needed during the day.  Meds & Orders: No orders of the defined types were placed in this encounter.   Orders  Placed This Encounter  Procedures   NCV with EMG (electromyography)    Follow-up: Return for Benita Stabile, MD.   Procedures: No procedures performed  EMG & NCV Findings: Evaluation of the right median (across palm) sensory nerve showed prolonged distal peak latency (Wrist, 3.7 ms).  All remaining nerves (as indicated in the following tables) were within normal limits.  All left vs. right side differences were within normal limits.    All examined muscles (as indicated in the following table) showed no evidence of electrical instability.    Impression: The above electrodiagnostic study is ABNORMAL and reveals evidence of a mild right median nerve entrapment at the wrist (carpal tunnel syndrome) affecting sensory components.   There is no significant electrodiagnostic evidence of any other focal nerve entrapment, brachial plexopathy or cervical radiculopathy in either upper limb.   Recommendations: 1.  Follow-up with referring physician. 2.  Continue current management of symptoms. 3.  Continue use of resting splint at night-time and as needed during the day.  ___________________________ [name] Board Certified, American Board of Physical Medicine and Rehabilitation    Nerve Conduction Studies Anti Sensory Summary Table   Stim Site NR Peak (ms) Norm Peak (ms) P-T Amp (V) Norm P-T Amp Site1 Site2 Delta-P (ms) Dist (cm) Vel (m/s) Norm Vel (m/s)  Left Median Acr Palm Anti Sensory (2nd Digit)  30.8C  Wrist    3.6 <3.6 20.6 >10 Wrist Palm 2.0 0.0    Palm  1.6 <2.0 16.5         Right Median Acr Palm Anti Sensory (2nd Digit)  30.7C  Wrist    *3.7 <3.6 22.8 >10 Wrist Palm 1.9 0.0    Palm    1.8 <2.0 4.6         Left Radial Anti Sensory (Base 1st Digit)  30.6C  Wrist    2.0 <3.1 26.2  Wrist Base 1st Digit 2.0 0.0    Left Ulnar Anti Sensory (5th Digit)  31.1C  Wrist    3.1 <3.7 27.4 >15.0 Wrist 5th Digit 3.1 14.0 45 >38   Motor Summary Table   Stim Site NR Onset (ms)  Norm Onset (ms) O-P Amp (mV) Norm O-P Amp Site1 Site2 Delta-0 (ms) Dist (cm) Vel (m/s) Norm Vel (m/s)  Left Median Motor (Abd Poll Brev)  31C  Wrist    3.5 <4.2 9.4 >5 Elbow Wrist 3.7 22.0 59 >50  Elbow    7.2  9.4         Right Median Motor (Abd Poll Brev)  29.7C  Wrist    3.8 <4.2 8.9 >5 Elbow Wrist 3.7 21.0 57 >50  Elbow    7.5  7.3         Left Ulnar Motor (Abd Dig Min)  31.3C  Wrist    3.1 <4.2 6.0 >3 B Elbow Wrist 3.4 20.0 59 >53  B Elbow    6.5  4.8  A Elbow B Elbow 1.4 10.0 71 >53  A Elbow    7.9  4.6          EMG   Side Muscle Nerve Root Ins Act Fibs Psw Amp Dur Poly Recrt Int Fraser Din Comment  Left Abd Poll Brev Median C8-T1 Nml Nml Nml Nml Nml 0 Nml Nml   Left 1stDorInt Ulnar C8-T1 Nml Nml Nml Nml Nml 0 Nml Nml   Left PronatorTeres Median C6-7 Nml Nml Nml Nml Nml 0 Nml Nml   Left Biceps Musculocut C5-6 Nml Nml Nml Nml Nml 0 Nml Nml   Left Deltoid Axillary C5-6 Nml Nml Nml Nml Nml 0 Nml Nml     Nerve Conduction Studies Anti Sensory Left/Right Comparison   Stim Site L Lat (ms) R Lat (ms) L-R Lat (ms) L Amp (V) R Amp (V) L-R Amp (%) Site1 Site2 L Vel (m/s) R Vel (m/s) L-R Vel (m/s)  Median Acr Palm Anti Sensory (2nd Digit)  30.8C  Wrist 3.6 *3.7 0.1 20.6 22.8 9.6 Wrist Palm     Palm 1.6 1.8 0.2 16.5 4.6 72.1       Radial Anti Sensory (Base 1st Digit)  30.6C  Wrist 2.0   26.2   Wrist Base 1st Digit     Ulnar Anti Sensory (5th Digit)  31.1C  Wrist 3.1   27.4   Wrist 5th Digit 45     Motor Left/Right Comparison   Stim Site L Lat (ms) R Lat (ms) L-R Lat (ms) L Amp (mV) R Amp (mV) L-R Amp (%) Site1 Site2 L Vel (m/s) R Vel (m/s) L-R Vel (m/s)  Median Motor (Abd Poll Brev)  31C  Wrist 3.5 3.8 0.3 9.4 8.9 5.3 Elbow Wrist 59 57 2  Elbow 7.2 7.5 0.3 9.4 7.3 22.3       Ulnar Motor (Abd Dig Min)  31.3C  Wrist 3.1   6.0   B Elbow Wrist 59    B Elbow 6.5   4.8   A Elbow B Elbow 71  A Elbow 7.9   4.6            Waveforms:                Clinical  History: 04/08/2020 EMG/NCS  Impression: The above electrodiagnostic study is ABNORMAL and reveals evidence of a mild right median nerve entrapment at the wrist (carpal tunnel syndrome) affecting sensory components.    There is no significant electrodiagnostic evidence of any other focal nerve entrapment, brachial plexopathy or cervical radiculopathy.    Recommendations: 1.  Follow-up with referring physician. 2.  Continue current management of symptoms. 3.  Continue use of resting splint at night-time and as needed during the day.   ___________________________ Laurence Spates FAAPMR     Objective:  VS:  HT:    WT:   BMI:     BP:   HR: bpm  TEMP: ( )  RESP:  Physical Exam Musculoskeletal:        General: No swelling, tenderness or deformity.     Comments: Inspection reveals no atrophy of the bilateral APB or FDI or hand intrinsics. There is no swelling, color changes, allodynia or dystrophic changes. There is 5 out of 5 strength in the bilateral wrist extension, finger abduction and long finger flexion. There is intact sensation to light touch in all dermatomal and peripheral nerve distributions. There is a negative Hoffmann's test bilaterally.  She does have left shoulder pain with abduction and movement.  Some impingement.  Equivocal Spurling's test this is just mainly causes her some neck pain.  Skin:    General: Skin is warm and dry.     Findings: No erythema or rash.  Neurological:     General: No focal deficit present.     Mental Status: She is alert and oriented to person, place, and time.     Motor: No weakness or abnormal muscle tone.     Coordination: Coordination normal.  Psychiatric:        Mood and Affect: Mood normal.        Behavior: Behavior normal.     Imaging: No results found.

## 2021-01-11 NOTE — Procedures (Signed)
EMG & NCV Findings: Evaluation of the right median (across palm) sensory nerve showed prolonged distal peak latency (Wrist, 3.7 ms).  All remaining nerves (as indicated in the following tables) were within normal limits.  All left vs. right side differences were within normal limits.    All examined muscles (as indicated in the following table) showed no evidence of electrical instability.    Impression: The above electrodiagnostic study is ABNORMAL and reveals evidence of a mild right median nerve entrapment at the wrist (carpal tunnel syndrome) affecting sensory components.   There is no significant electrodiagnostic evidence of any other focal nerve entrapment, brachial plexopathy or cervical radiculopathy in either upper limb.   Recommendations: 1.  Follow-up with referring physician. 2.  Continue current management of symptoms. 3.  Continue use of resting splint at night-time and as needed during the day.  ___________________________ [name] Board Certified, American Board of Physical Medicine and Rehabilitation    Nerve Conduction Studies Anti Sensory Summary Table   Stim Site NR Peak (ms) Norm Peak (ms) P-T Amp (V) Norm P-T Amp Site1 Site2 Delta-P (ms) Dist (cm) Vel (m/s) Norm Vel (m/s)  Left Median Acr Palm Anti Sensory (2nd Digit)  30.8C  Wrist    3.6 <3.6 20.6 >10 Wrist Palm 2.0 0.0    Palm    1.6 <2.0 16.5         Right Median Acr Palm Anti Sensory (2nd Digit)  30.7C  Wrist    *3.7 <3.6 22.8 >10 Wrist Palm 1.9 0.0    Palm    1.8 <2.0 4.6         Left Radial Anti Sensory (Base 1st Digit)  30.6C  Wrist    2.0 <3.1 26.2  Wrist Base 1st Digit 2.0 0.0    Left Ulnar Anti Sensory (5th Digit)  31.1C  Wrist    3.1 <3.7 27.4 >15.0 Wrist 5th Digit 3.1 14.0 45 >38   Motor Summary Table   Stim Site NR Onset (ms) Norm Onset (ms) O-P Amp (mV) Norm O-P Amp Site1 Site2 Delta-0 (ms) Dist (cm) Vel (m/s) Norm Vel (m/s)  Left Median Motor (Abd Poll Brev)  31C  Wrist     3.5 <4.2 9.4 >5 Elbow Wrist 3.7 22.0 59 >50  Elbow    7.2  9.4         Right Median Motor (Abd Poll Brev)  29.7C  Wrist    3.8 <4.2 8.9 >5 Elbow Wrist 3.7 21.0 57 >50  Elbow    7.5  7.3         Left Ulnar Motor (Abd Dig Min)  31.3C  Wrist    3.1 <4.2 6.0 >3 B Elbow Wrist 3.4 20.0 59 >53  B Elbow    6.5  4.8  A Elbow B Elbow 1.4 10.0 71 >53  A Elbow    7.9  4.6          EMG   Side Muscle Nerve Root Ins Act Fibs Psw Amp Dur Poly Recrt Int Fraser Din Comment  Left Abd Poll Brev Median C8-T1 Nml Nml Nml Nml Nml 0 Nml Nml   Left 1stDorInt Ulnar C8-T1 Nml Nml Nml Nml Nml 0 Nml Nml   Left PronatorTeres Median C6-7 Nml Nml Nml Nml Nml 0 Nml Nml   Left Biceps Musculocut C5-6 Nml Nml Nml Nml Nml 0 Nml Nml   Left Deltoid Axillary C5-6 Nml Nml Nml Nml Nml 0 Nml Nml     Nerve Conduction Studies  Anti Sensory Left/Right Comparison   Stim Site L Lat (ms) R Lat (ms) L-R Lat (ms) L Amp (V) R Amp (V) L-R Amp (%) Site1 Site2 L Vel (m/s) R Vel (m/s) L-R Vel (m/s)  Median Acr Palm Anti Sensory (2nd Digit)  30.8C  Wrist 3.6 *3.7 0.1 20.6 22.8 9.6 Wrist Palm     Palm 1.6 1.8 0.2 16.5 4.6 72.1       Radial Anti Sensory (Base 1st Digit)  30.6C  Wrist 2.0   26.2   Wrist Base 1st Digit     Ulnar Anti Sensory (5th Digit)  31.1C  Wrist 3.1   27.4   Wrist 5th Digit 45     Motor Left/Right Comparison   Stim Site L Lat (ms) R Lat (ms) L-R Lat (ms) L Amp (mV) R Amp (mV) L-R Amp (%) Site1 Site2 L Vel (m/s) R Vel (m/s) L-R Vel (m/s)  Median Motor (Abd Poll Brev)  31C  Wrist 3.5 3.8 0.3 9.4 8.9 5.3 Elbow Wrist 59 57 2  Elbow 7.2 7.5 0.3 9.4 7.3 22.3       Ulnar Motor (Abd Dig Min)  31.3C  Wrist 3.1   6.0   B Elbow Wrist 59    B Elbow 6.5   4.8   A Elbow B Elbow 71    A Elbow 7.9   4.6            Waveforms:

## 2021-01-26 ENCOUNTER — Encounter: Payer: Self-pay | Admitting: Physician Assistant

## 2021-01-26 ENCOUNTER — Ambulatory Visit (INDEPENDENT_AMBULATORY_CARE_PROVIDER_SITE_OTHER): Payer: BC Managed Care – PPO | Admitting: Physician Assistant

## 2021-01-26 DIAGNOSIS — G5601 Carpal tunnel syndrome, right upper limb: Secondary | ICD-10-CM

## 2021-01-26 DIAGNOSIS — M542 Cervicalgia: Secondary | ICD-10-CM

## 2021-01-26 NOTE — Progress Notes (Signed)
HPI: Makayla Good returns today to go over the EMG nerve conduction studies of her upper extremities.  She states that the symptoms have improved in her left arm and right hand.  She notes still with left arm she is having no pain but whenever she moves her neck she feels as if something is being stretched.  She has had no pain in the neck or left arm for the last week.  In the right hand no significant numbness tingling for the last week or so.  She was seen in the ER on 829 due to chest pain Mrs. Felt to be atypical chest pain her troponin was negative.'s been doing some stretching at home.  EMG nerve conduction duction studies were reviewed with her and shows mild carpal tunnel syndrome on the right.  Negative on the left.  Review of systems see HPI otherwise negative  Physical exam: Bilateral shoulders 5 out of 5 strength with external and internal rotation against resistance empty can test is negative bilaterally.  She has good range of motion of both shoulders without pain.  Nontender over the medial borders of both scapula.   Cervical spine: Slight tenderness over the cervical spinal column with palpation.  Negative Spurling's.  Rotation left causes discomfort in the neck and shoulder region. Bilateral upper extremity she has full sensation full motor bilateral hands.  Positive Tinel's over the median nerve at the wrist on the right only.  Impression: Right mild carpal tunnel syndrome  Cervical algia   Plan: Given the fact that her symptoms overall have improved would not recommend any additional imaging.  Again she had a CT scan of her cervical spine that showed some degenerative changes at C5-C6 with mild central canal stenosis and neural foraminal narrowing.  Recommend at this time physical therapy for cervical algia they will work on range of motion C-spine stretching, modalities and she will gain a home exercise program.  In regards to the right carpal tunnel syndrome given the fact that  is mild recommend conservative measures only at this point time she states is not bothering her enough to do anything.  She follow-up with Dr. Ninfa Linden in 6 weeks see how she is doing overall.  Questions encouraged and answered at length.

## 2021-01-27 NOTE — Addendum Note (Signed)
Addended by: Robyne Peers on: 01/27/2021 10:04 AM   Modules accepted: Orders

## 2021-02-23 ENCOUNTER — Ambulatory Visit (INDEPENDENT_AMBULATORY_CARE_PROVIDER_SITE_OTHER): Payer: BC Managed Care – PPO | Admitting: Family Medicine

## 2021-02-23 ENCOUNTER — Other Ambulatory Visit: Payer: Self-pay

## 2021-02-23 ENCOUNTER — Encounter: Payer: Self-pay | Admitting: Family Medicine

## 2021-02-23 VITALS — BP 122/90 | HR 102 | Temp 99.1°F | Resp 18 | Ht 65.0 in | Wt 207.8 lb

## 2021-02-23 DIAGNOSIS — E785 Hyperlipidemia, unspecified: Secondary | ICD-10-CM | POA: Diagnosis not present

## 2021-02-23 DIAGNOSIS — Z23 Encounter for immunization: Secondary | ICD-10-CM | POA: Diagnosis not present

## 2021-02-23 DIAGNOSIS — F419 Anxiety disorder, unspecified: Secondary | ICD-10-CM | POA: Diagnosis not present

## 2021-02-23 MED ORDER — ALPRAZOLAM 0.25 MG PO TABS: 0.2500 mg | ORAL_TABLET | Freq: Three times a day (TID) | ORAL | 0 refills | Status: DC | PRN

## 2021-02-23 NOTE — Progress Notes (Signed)
Subjective:   By signing my name below, I, Makayla Good, attest that this documentation has been prepared under the direction and in the presence of Dr. Roma Schanz, DO. 02/23/2021    Patient ID: Makayla Good, female    DOB: 01/29/1971, 50 y.o.   MRN: [id number]  Chief Complaint  Patient presents with   Anxiety   Follow-up   Hyperlipidemia    Anxiety Symptoms include nervous/anxious behavior. Patient reports no chest pain, dizziness, nausea, palpitations or shortness of breath.    Hyperlipidemia Pertinent negatives include no chest pain or shortness of breath.  Patient is in today for a office visit.   Anxiety- She continues having anxiety at this time. She is not taking her 10 mg lexapro daily PO due to potential side effects. She reports she will be compliant start taking the medication to manage her anxiety. She will stop taking the medication if she experiences side effects. She visited an ED on 12/19/2020 for atypical chest pain and found no new issues. She reports her chest pain was most likely caused by anxiety.  Counseling- She has not seen a counselor recently but is planning to make an appointment.  Exercise- She is trying to exercise more often. She notes that she has reduced the frequency of her regular exercise due to experiencing dizziness from vertigo.  Arm tingling- She reports having occasional tingling in her arms. 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 She has not had a follow up appointment with her provider yet.  Immunizations- She is UTD on flu vaccines during this year. She has 2 Covid-19 vaccines at this time. She is interested in receiving the bivalent Covid-19 vaccine at her work place. She is eligible for the shingles vaccine and is interested in receiving during this visit.  Cholesterol- She is checking her cholesterol levels during this visit. She is not taking any medication to manage her  cholesterol at this time.  Lab Results  Component Value Date   CHOL 203 (H) 10/11/2020   HDL 61.20 10/11/2020   LDLCALC 110 (H) 10/11/2020   TRIG 155.0 (H) 10/11/2020   CHOLHDL 3 10/11/2020    Past Medical History:  Diagnosis Date   Asthma    Eczema    Fibroid    H/O oophorectomy    Hypertension    Obesity    Polycystic ovary    Left   Sleep apnea    no CPAP per MD    Past Surgical History:  Procedure Laterality Date   BACK SURGERY     84yrs ago   BREAST BIOPSY Right 2019   fibroadenoma   CESAREAN SECTION  02/01/2005   CESAREAN SECTION  2001   LAPAROSCOPIC GASTRIC SLEEVE RESECTION N/A 07/29/2017   Procedure: LAPAROSCOPIC GASTRIC SLEEVE RESECTION WITH UPPER ENDO ;  Surgeon: [name], MD;  Location: [facility];  Service: General;  Laterality: N/A;   OOPHORECTOMY     Rt.removed   TUBAL LIGATION  02/01/2005    Family History  Problem Relation Age of Onset   Hypertension Mother    Schizophrenia Mother    Dementia Mother    Mental illness Mother        schizophrenia, dementia   Hypertension Father    Coronary artery disease Father        Stent   Alzheimer's disease Father    Hypertension Sister    Hypertension Sister    Arthritis Sister  rheumatoid   Dementia Maternal Aunt    Kidney disease Other    Colon cancer Neg Hx    Stomach cancer Neg Hx    Pancreatic cancer Neg Hx    Esophageal cancer Neg Hx    Rectal cancer Neg Hx     Social History   Socioeconomic History   Marital status: Married    Spouse name: Not on file   Number of children: Not on file   Years of education: 13   Highest education level: Not on file  Occupational History   Occupation: release liens    Employer: [employer]  Tobacco Use   Smoking status: Former    Packs/day: 0.50    Years: 30.00    Pack years: 15.00    Types: Cigarettes    Start date: 04/28/2020   Smokeless tobacco: Never   Tobacco comments:    will try chantix   Vaping Use   Vaping Use: Some days   Substance and Sexual Activity   Alcohol use: Not Currently   Drug use: No   Sexual activity: Not Currently    Partners: Male    Birth control/protection: Surgical    Comment: Tubal/been with same person over 20 years  Other Topics Concern   Not on file  Social History Narrative   Exercise--gym, every other day--- at least 3 days a week   Social Determinants of Health   Financial Resource Strain: Not on file  Food Insecurity: Not on file  Transportation Needs: Not on file  Physical Activity: Not on file  Stress: Not on file  Social Connections: Not on file  Intimate Partner Violence: Not on file    Outpatient Medications Prior to Visit  Medication Sig Dispense Refill   amLODipine (NORVASC) 5 MG tablet Take 1 tablet (5 mg total) by mouth daily. 90 tablet 3   ASHWAGANDHA PO Take 1 tablet by mouth daily.     Biotin w/ Vitamins C & E (HAIR/SKIN/NAILS PO) Take 1 tablet by mouth daily.     fluticasone (FLONASE) 50 MCG/ACT nasal spray Place 2 sprays into both nostrils daily. 16 g 6   hydrochlorothiazide (HYDRODIURIL) 25 MG tablet TAKE 1 TABLET BY MOUTH EVERY DAY 90 tablet 1   meclizine (ANTIVERT) 12.5 MG tablet Take 1 tablet (12.5 mg total) by mouth 3 (three) times daily as needed for dizziness. 30 tablet 0   Multiple Vitamins-Minerals (CELEBRATE MULTI-COMPLETE 36 PO) Take 1 each by mouth daily.     nicotine (NICODERM CQ - DOSED IN MG/24 HOURS) 14 mg/24hr patch Place 1 patch (14 mg total) onto the skin daily. 28 patch 1   potassium chloride (KLOR-CON) 10 MEQ tablet Take 1 tablet (10 mEq total) by mouth daily. 90 tablet 1   escitalopram (LEXAPRO) 10 MG tablet TAKE 1 TABLET BY MOUTH EVERY DAY (Patient not taking: Reported on 02/23/2021) 90 tablet 1   famotidine (PEPCID) 20 MG tablet Take 1 tablet (20 mg total) by mouth 2 (two) times daily. (Patient not taking: Reported on 02/23/2021) 30 tablet 0   omeprazole (PRILOSEC) 20 MG capsule Take 1 capsule (20 mg total) by mouth 2 (two) times daily  before a meal. (Patient not taking: Reported on 02/23/2021) 30 capsule 0   No facility-administered medications prior to visit.    No Known Allergies  Review of Systems  Constitutional:  Negative for fever and malaise/fatigue.  HENT:  Negative for congestion.   Eyes:  Negative for blurred vision.  Respiratory:  Negative for  shortness of breath.   Cardiovascular:  Negative for chest pain, palpitations and leg swelling.  Gastrointestinal:  Negative for abdominal pain, blood in stool and nausea.  Genitourinary:  Negative for dysuria and frequency.  Musculoskeletal:  Negative for falls.  Skin:  Negative for rash.  Neurological:  Positive for tingling (bilateral arm). Negative for dizziness, loss of consciousness and headaches.  Endo/Heme/Allergies:  Negative for environmental allergies.  Psychiatric/Behavioral:  Negative for depression. The patient is nervous/anxious.       Objective:    Physical Exam Vitals and nursing note reviewed.  Constitutional:      General: She is not in acute distress.    Appearance: Normal appearance. She is not ill-appearing.  HENT:     Head: Normocephalic and atraumatic.     Right Ear: External ear normal.     Left Ear: External ear normal.  Eyes:     Extraocular Movements: Extraocular movements intact.     Pupils: Pupils are equal, round, and reactive to light.  Cardiovascular:     Rate and Rhythm: Normal rate and regular rhythm.     Heart sounds: Normal heart sounds. No murmur heard.   No gallop.  Pulmonary:     Effort: Pulmonary effort is normal. No respiratory distress.     Breath sounds: Normal breath sounds. No wheezing or rales.  Skin:    General: Skin is warm and dry.  Neurological:     Mental Status: She is alert and oriented to person, place, and time.  Psychiatric:        Behavior: Behavior normal.        Judgment: Judgment normal.    BP 122/90 (BP Location: Left Arm, Patient Position: Sitting, Cuff Size: Large)   Pulse (!) 102    Temp 99.1 F (37.3 C) (Oral)   Resp 18   Ht 5\' 5"  (1.651 m)   Wt 207 lb 12.8 oz (94.3 kg)   SpO2 98%   BMI 34.58 kg/m  Wt Readings from Last 3 Encounters:  02/23/21 207 lb 12.8 oz (94.3 kg)  12/27/20 203 lb (92.1 kg)  12/18/20 200 lb (90.7 kg)    Diabetic Foot Exam - Simple   No data filed    Lab Results  Component Value Date   WBC 6.4 12/19/2020   HGB 13.1 12/19/2020   HCT 38.0 12/19/2020   PLT 217 12/19/2020   GLUCOSE 108 (H) 12/19/2020   CHOL 203 (H) 10/11/2020   TRIG 155.0 (H) 10/11/2020   HDL 61.20 10/11/2020   LDLCALC 110 (H) 10/11/2020   ALT 25 10/11/2020   AST 20 10/11/2020   NA 139 12/19/2020   K 3.2 (L) 12/19/2020   CL 102 12/19/2020   CREATININE 0.58 12/19/2020   BUN 13 12/19/2020   CO2 28 12/19/2020   TSH 0.99 10/11/2020   HGBA1C 5.6 05/05/2020   MICROALBUR <0.7 01/23/2016    Lab Results  Component Value Date   TSH 0.99 10/11/2020   Lab Results  Component Value Date   WBC 6.4 12/19/2020   HGB 13.1 12/19/2020   HCT 38.0 12/19/2020   MCV 96.0 12/19/2020   PLT 217 12/19/2020   Lab Results  Component Value Date   NA 139 12/19/2020   K 3.2 (L) 12/19/2020   CO2 28 12/19/2020   GLUCOSE 108 (H) 12/19/2020   BUN 13 12/19/2020   CREATININE 0.58 12/19/2020   BILITOT 0.4 10/11/2020   ALKPHOS 61 10/11/2020   AST 20 10/11/2020   ALT 25 10/11/2020  PROT 7.1 10/11/2020   ALBUMIN 4.4 10/11/2020   CALCIUM 9.4 12/19/2020   ANIONGAP 9 12/19/2020   GFR 99.48 10/11/2020   Lab Results  Component Value Date   CHOL 203 (H) 10/11/2020   Lab Results  Component Value Date   HDL 61.20 10/11/2020   Lab Results  Component Value Date   LDLCALC 110 (H) 10/11/2020   Lab Results  Component Value Date   TRIG 155.0 (H) 10/11/2020   Lab Results  Component Value Date   CHOLHDL 3 10/11/2020   Lab Results  Component Value Date   HGBA1C 5.6 05/05/2020       Assessment & Plan:   Problem List Items Addressed This Visit       Unprioritized    Anxiety    Pt will start the lexapro at night and will give rx xanax as well  rto 1 month       Relevant Medications   ALPRAZolam (XANAX) 0.25 MG tablet   Hyperlipidemia LDL goal <100    Encourage heart healthy diet such as MIND or DASH diet, increase exercise, avoid trans fats, simple carbohydrates and processed foods, consider a krill or fish or flaxseed oil cap daily.       Other Visit Diagnoses     Hyperlipidemia, unspecified hyperlipidemia type    -  Primary   Relevant Orders   Lipid panel   Comprehensive metabolic panel   Need for shingles vaccine       Relevant Orders   Varicella-zoster vaccine IM (Completed)        Meds ordered this encounter  Medications   ALPRAZolam (XANAX) 0.25 MG tablet    Sig: Take 1 tablet (0.25 mg total) by mouth 3 (three) times daily as needed for anxiety.    Dispense:  30 tablet    Refill:  0    I, Dr. Roma Schanz, DO, personally preformed the services described in this documentation.  All medical record entries made by the scribe were at my direction and in my presence.  I have reviewed the chart and discharge instructions (if applicable) and agree that the record reflects my personal performance and is accurate and complete. 02/23/2021   I,Makayla Good,acting as a scribe for [name], DO.,have documented all relevant documentation on the behalf of [name], DO,as directed by  [name], DO while in the presence of [name], DO.   [name], DO

## 2021-02-23 NOTE — Assessment & Plan Note (Signed)
Encourage heart healthy diet such as MIND or DASH diet, increase exercise, avoid trans fats, simple carbohydrates and processed foods, consider a krill or fish or flaxseed oil cap daily.  °

## 2021-02-23 NOTE — Patient Instructions (Signed)

## 2021-02-23 NOTE — Assessment & Plan Note (Signed)
Pt will start the lexapro at night and will give rx xanax as well  rto 1 month

## 2021-02-24 LAB — COMPREHENSIVE METABOLIC PANEL
ALT: 24 U/L (ref 0–35)
AST: 21 U/L (ref 0–37)
Albumin: 4.3 g/dL (ref 3.5–5.2)
Alkaline Phosphatase: 66 U/L (ref 39–117)
BUN: 18 mg/dL (ref 6–23)
CO2: 30 mEq/L (ref 19–32)
Calcium: 9.6 mg/dL (ref 8.4–10.5)
Chloride: 103 mEq/L (ref 96–112)
Creatinine, Ser: 0.74 mg/dL (ref 0.40–1.20)
GFR: 94.42 mL/min (ref >60.00)
Glucose, Bld: 91 mg/dL (ref 70–99)
Potassium: 3.9 mEq/L (ref 3.5–5.1)
Sodium: 141 mEq/L (ref 135–145)
Total Bilirubin: 0.4 mg/dL (ref 0.2–1.2)
Total Protein: 7.1 g/dL (ref 6.0–8.3)

## 2021-02-24 LAB — LIPID PANEL
Cholesterol: 195 mg/dL (ref 0–200)
HDL: 57.5 mg/dL (ref >39.00)
LDL Cholesterol: 112 mg/dL — ABNORMAL HIGH (ref 0–99)
NonHDL: 137.3
Total CHOL/HDL Ratio: 3
Triglycerides: 126 mg/dL (ref 0.0–149.0)
VLDL: 25.2 mg/dL (ref 0.0–40.0)

## 2021-03-06 ENCOUNTER — Other Ambulatory Visit: Payer: Self-pay | Admitting: Neurology

## 2021-03-19 ENCOUNTER — Other Ambulatory Visit: Payer: Self-pay | Admitting: Family Medicine

## 2021-03-19 DIAGNOSIS — F419 Anxiety disorder, unspecified: Secondary | ICD-10-CM

## 2021-04-11 ENCOUNTER — Telehealth (INDEPENDENT_AMBULATORY_CARE_PROVIDER_SITE_OTHER): Payer: BC Managed Care – PPO | Admitting: Family Medicine

## 2021-04-11 ENCOUNTER — Encounter: Payer: Self-pay | Admitting: Family Medicine

## 2021-04-11 DIAGNOSIS — F419 Anxiety disorder, unspecified: Secondary | ICD-10-CM

## 2021-04-11 MED ORDER — BUSPIRONE HCL 7.5 MG PO TABS: 7.5000 mg | ORAL_TABLET | Freq: Two times a day (BID) | ORAL | 2 refills | Status: DC

## 2021-04-11 MED ORDER — POTASSIUM CHLORIDE ER 10 MEQ PO TBCR: 10.0000 meq | EXTENDED_RELEASE_TABLET | Freq: Every day | ORAL | 1 refills | Status: DC

## 2021-04-11 NOTE — Progress Notes (Signed)
MyChart Video Visit    Virtual Visit via Video Note   This visit type was conducted due to national recommendations for restrictions regarding the COVID-19 Pandemic (e.g. social distancing) in an effort to limit this patient's exposure and mitigate transmission in our community. This patient is at least at moderate risk for complications without adequate follow up. This format is felt to be most appropriate for this patient at this time. Physical exam was limited by quality of the video and audio technology used for the visit. [name] was able to get the patient set up on a video visit.  Patient location: Home Patient and provider in visit Provider location: Office  I discussed the limitations of evaluation and management by telemedicine and the availability of in person appointments. The patient expressed understanding and agreed to proceed.  Visit Date: 04/11/2021  Today's healthcare provider: [name], DO     Subjective:    Patient ID: Makayla Good, female    DOB: 1970/11/11, 50 y.o.   MRN: [id number]  No chief complaint on file.   HPI Patient is in today for a video visit.   She stopped taking 10 mg lexapro daily PO due to having blurry vision while taking it. She reports driving and working were affected due to her blurred vision. She also reports having occasional difficulty falling asleep. She is requesting to switch to another medication to manage her anxiety. She has an upcomming appointment with her psychologist this week. She has not seen them in the past year.  She is requesting to refill her 10 mew potassium chloride daily PO. Her potassium levels were last check last month and results were normal. She has not taken her potassium supplements in the past 4 days.    Past Medical History:  Diagnosis Date   Asthma    Eczema    Fibroid    H/O oophorectomy    Hypertension    Obesity    Polycystic ovary    Left   Sleep apnea     no CPAP per MD    Past Surgical History:  Procedure Laterality Date   BACK SURGERY     9yrs ago   BREAST BIOPSY Right 2019   fibroadenoma   CESAREAN SECTION  02/01/2005   CESAREAN SECTION  2001   LAPAROSCOPIC GASTRIC SLEEVE RESECTION N/A 07/29/2017   Procedure: LAPAROSCOPIC GASTRIC SLEEVE RESECTION WITH UPPER ENDO ;  Surgeon: [name], MD;  Location: [facility];  Service: General;  Laterality: N/A;   OOPHORECTOMY     Rt.removed   TUBAL LIGATION  02/01/2005    Family History  Problem Relation Age of Onset   Hypertension Mother    Schizophrenia Mother    Dementia Mother    Mental illness Mother        schizophrenia, dementia   Hypertension Father    Coronary artery disease Father        Stent   Alzheimer's disease Father    Hypertension Sister    Hypertension Sister    Arthritis Sister        rheumatoid   Dementia Maternal Aunt    Kidney disease Other    Colon cancer Neg Hx    Stomach cancer Neg Hx    Pancreatic cancer Neg Hx    Esophageal cancer Neg Hx    Rectal cancer Neg Hx     Social History   Socioeconomic History   Marital status: Married  Spouse name: Not on file   Number of children: Not on file   Years of education: 13   Highest education level: Not on file  Occupational History   Occupation: release liens    Employer: [employer]  Tobacco Use   Smoking status: Former    Packs/day: 0.50    Years: 30.00    Pack years: 15.00    Types: Cigarettes    Start date: 04/28/2020   Smokeless tobacco: Never   Tobacco comments:    will try chantix   Vaping Use   Vaping Use: Some days  Substance and Sexual Activity   Alcohol use: Not Currently   Drug use: No   Sexual activity: Not Currently    Partners: Male    Birth control/protection: Surgical    Comment: Tubal/been with same person over 20 years  Other Topics Concern   Not on file  Social History Narrative   Exercise--gym, every other day--- at least 3 days a week   Social  Determinants of Health   Financial Resource Strain: Not on file  Food Insecurity: Not on file  Transportation Needs: Not on file  Physical Activity: Not on file  Stress: Not on file  Social Connections: Not on file  Intimate Partner Violence: Not on file    Outpatient Medications Prior to Visit  Medication Sig Dispense Refill   ALPRAZolam (XANAX) 0.25 MG tablet Take 1 tablet (0.25 mg total) by mouth 3 (three) times daily as needed for anxiety. 30 tablet 0   amLODipine (NORVASC) 5 MG tablet Take 1 tablet (5 mg total) by mouth daily. 90 tablet 3   ASHWAGANDHA PO Take 1 tablet by mouth daily.     Biotin w/ Vitamins C & E (HAIR/SKIN/NAILS PO) Take 1 tablet by mouth daily.     fluticasone (FLONASE) 50 MCG/ACT nasal spray Place 2 sprays into both nostrils daily. 16 g 6   hydrochlorothiazide (HYDRODIURIL) 25 MG tablet TAKE 1 TABLET BY MOUTH EVERY DAY 90 tablet 1   meclizine (ANTIVERT) 12.5 MG tablet Take 1 tablet (12.5 mg total) by mouth 3 (three) times daily as needed for dizziness. 30 tablet 0   Multiple Vitamins-Minerals (CELEBRATE MULTI-COMPLETE 36 PO) Take 1 each by mouth daily.     nicotine (NICODERM CQ - DOSED IN MG/24 HOURS) 14 mg/24hr patch Place 1 patch (14 mg total) onto the skin daily. 28 patch 1   escitalopram (LEXAPRO) 10 MG tablet TAKE 1 TABLET BY MOUTH EVERY DAY 30 tablet 0   potassium chloride (KLOR-CON) 10 MEQ tablet Take 1 tablet (10 mEq total) by mouth daily. 90 tablet 1   famotidine (PEPCID) 20 MG tablet Take 1 tablet (20 mg total) by mouth 2 (two) times daily. (Patient not taking: Reported on 02/23/2021) 30 tablet 0   omeprazole (PRILOSEC) 20 MG capsule Take 1 capsule (20 mg total) by mouth 2 (two) times daily before a meal. (Patient not taking: Reported on 02/23/2021) 30 capsule 0   No facility-administered medications prior to visit.    No Known Allergies  Review of Systems  Constitutional:  Negative for fever and malaise/fatigue.  HENT:  Negative for congestion.    Eyes:  Positive for blurred vision (while taking lexapro).  Respiratory:  Negative for shortness of breath.   Cardiovascular:  Negative for chest pain, palpitations and leg swelling.  Gastrointestinal:  Negative for abdominal pain, blood in stool and nausea.  Genitourinary:  Negative for dysuria and frequency.  Musculoskeletal:  Negative for falls.  Skin:  Negative for rash.  Neurological:  Negative for dizziness, loss of consciousness and headaches.  Endo/Heme/Allergies:  Negative for environmental allergies.  Psychiatric/Behavioral:  Negative for depression. The patient is not nervous/anxious.       Objective:    Physical Exam Vitals and nursing note reviewed.  Constitutional:      Appearance: She is well-developed.  HENT:     Head: Normocephalic and atraumatic.  Eyes:     Conjunctiva/sclera: Conjunctivae normal.  Neck:     Thyroid: No thyromegaly.     Vascular: No carotid bruit or JVD.  Cardiovascular:     Rate and Rhythm: Normal rate and regular rhythm.     Heart sounds: Normal heart sounds. No murmur heard. Pulmonary:     Effort: Pulmonary effort is normal. No respiratory distress.     Breath sounds: Normal breath sounds. No wheezing or rales.  Chest:     Chest wall: No tenderness.  Musculoskeletal:     Cervical back: Normal range of motion and neck supple.  Neurological:     Mental Status: She is alert and oriented to person, place, and time.    There were no vitals taken for this visit. Wt Readings from Last 3 Encounters:  02/23/21 207 lb 12.8 oz (94.3 kg)  12/27/20 203 lb (92.1 kg)  12/18/20 200 lb (90.7 kg)    Diabetic Foot Exam - Simple   No data filed    Lab Results  Component Value Date   WBC 6.4 12/19/2020   HGB 13.1 12/19/2020   HCT 38.0 12/19/2020   PLT 217 12/19/2020   GLUCOSE 91 02/23/2021   CHOL 195 02/23/2021   TRIG 126.0 02/23/2021   HDL 57.50 02/23/2021   LDLCALC 112 (H) 02/23/2021   ALT 24 02/23/2021   AST 21 02/23/2021   NA 141  02/23/2021   K 3.9 02/23/2021   CL 103 02/23/2021   CREATININE 0.74 02/23/2021   BUN 18 02/23/2021   CO2 30 02/23/2021   TSH 0.99 10/11/2020   HGBA1C 5.6 05/05/2020   MICROALBUR <0.7 01/23/2016    Lab Results  Component Value Date   TSH 0.99 10/11/2020   Lab Results  Component Value Date   WBC 6.4 12/19/2020   HGB 13.1 12/19/2020   HCT 38.0 12/19/2020   MCV 96.0 12/19/2020   PLT 217 12/19/2020   Lab Results  Component Value Date   NA 141 02/23/2021   K 3.9 02/23/2021   CO2 30 02/23/2021   GLUCOSE 91 02/23/2021   BUN 18 02/23/2021   CREATININE 0.74 02/23/2021   BILITOT 0.4 02/23/2021   ALKPHOS 66 02/23/2021   AST 21 02/23/2021   ALT 24 02/23/2021   PROT 7.1 02/23/2021   ALBUMIN 4.3 02/23/2021   CALCIUM 9.6 02/23/2021   ANIONGAP 9 12/19/2020   GFR 94.42 02/23/2021   Lab Results  Component Value Date   CHOL 195 02/23/2021   Lab Results  Component Value Date   HDL 57.50 02/23/2021   Lab Results  Component Value Date   LDLCALC 112 (H) 02/23/2021   Lab Results  Component Value Date   TRIG 126.0 02/23/2021   Lab Results  Component Value Date   CHOLHDL 3 02/23/2021   Lab Results  Component Value Date   HGBA1C 5.6 05/05/2020       Assessment & Plan:   Problem List Items Addressed This Visit       Unprioritized   Anxiety - Primary    lexapro d/c due  to blurry vision  Try buspar bid and f/u in 1 month or sooner prn       Relevant Medications   busPIRone (BUSPAR) 7.5 MG tablet     Meds ordered this encounter  Medications   busPIRone (BUSPAR) 7.5 MG tablet    Sig: Take 1 tablet (7.5 mg total) by mouth 2 (two) times daily.    Dispense:  60 tablet    Refill:  2   potassium chloride (KLOR-CON) 10 MEQ tablet    Sig: Take 1 tablet (10 mEq total) by mouth daily.    Dispense:  90 tablet    Refill:  1    I discussed the assessment and treatment plan with the patient. The patient was provided an opportunity to ask questions and all were  answered. The patient agreed with the plan and demonstrated an understanding of the instructions.   The patient was advised to call back or seek an in-person evaluation if the symptoms worsen or if the condition fails to improve as anticipated.  I,[name],acting as a Education administrator for [employer], DO.,have documented all relevant documentation on the behalf of [name], DO,as directed by  [name], DO while in the presence of [name], DO.  I provided 20 minutes of face-to-face time during this encounter.   [name], DO [place] [facility] [phone] (phone) [phone] (fax)  [place]

## 2021-04-11 NOTE — Patient Instructions (Signed)

## 2021-04-12 NOTE — Assessment & Plan Note (Signed)
lexapro d/c due to blurry vision  Try buspar bid and f/u in 1 month or sooner prn

## 2021-04-13 ENCOUNTER — Telehealth: Payer: Self-pay | Admitting: Physical Medicine and Rehabilitation

## 2021-04-13 NOTE — Telephone Encounter (Signed)
Patient called. She would like an appointment with Dr. Ernestina Patches. Her call back number is [phone]. Would also like some pain medication called in.

## 2021-04-20 DIAGNOSIS — F4323 Adjustment disorder with mixed anxiety and depressed mood: Secondary | ICD-10-CM | POA: Diagnosis not present

## 2021-04-25 ENCOUNTER — Telehealth: Payer: Self-pay | Admitting: Physical Medicine and Rehabilitation

## 2021-04-25 ENCOUNTER — Telehealth: Payer: Self-pay

## 2021-04-25 NOTE — Telephone Encounter (Signed)
Patient called needing to reschedule her appointment. The number to contact patient is [phone]

## 2021-04-25 NOTE — Telephone Encounter (Signed)
Pt needs appointment please 

## 2021-04-25 NOTE — Telephone Encounter (Signed)
Nurse Assessment Nurse: Mariel Sleet, RN, Erasmo Downer Date/Time (Eastern Time): 04/24/2021 9:02:39 AM Confirm and document reason for call. If symptomatic, describe symptoms. ---Caller states that since last week she has had a cough that is productive with green mucous, stuffy nose green mucous and blood out of nose, headache. Does the patient have any new or worsening symptoms? ---Yes Will a triage be completed? ---Yes Related visit to physician within the last 2 weeks? ---No Does the PT have any chronic conditions? (i.e. diabetes, asthma, this includes High risk factors for pregnancy, etc.) ---No Is the patient pregnant or possibly pregnant? (Ask all females between the ages of 61-55) ---No Is this a behavioral health or substance abuse call? ---No Guidelines Guideline Title Affirmed Question Affirmed Notes Nurse Date/Time (Eastern Time) Cough - Acute Productive [1] Using nasal washes and pain medicine > 24 hours AND [2] sinus pain (around cheekbone or eye) persists Mariel Sleet, RN, Erasmo Downer 04/24/2021 9:04:53 AM Disp. Time Eilene Ghazi Time) Disposition Final User 04/24/2021 9:10:02 AM See PCP within 24 Hours Yes Donadeo, RN, Erasmo Downer PLEASE NOTE: All timestamps contained within this report are represented as [place] Standard Time. CONFIDENTIALTY NOTICE: This fax transmission is intended only for the addressee. It contains information that is legally privileged, confidential or otherwise protected from use or disclosure. If you are not the intended recipient, you are strictly prohibited from reviewing, disclosing, copying using or disseminating any of this information or taking any action in reliance on or regarding this information. If you have received this fax in error, please notify us immediately by telephone so that we can arrange for its return to [place]. Phone: [phone], Toll-Free: [phone], Fax: [phone] Page: 2 of 2 Call Id: [id number] Nunez Disagree/Comply Comply Caller  Understands Yes PreDisposition Call Doctor Care Advice Given Per Guideline SEE PCP WITHIN 24 HOURS: * IF OFFICE WILL BE OPEN: You need to be examined within the next 24 hours. Call your doctor (or NP/PA) when the office opens and make an appointment. PAIN MEDICINES: * ACETAMINOPHEN - REGULAR STRENGTH TYLENOL: Take 650 mg (two 325 mg pills) by mouth every 4 to 6 hours as needed. Each Regular Strength Tylenol pill has 325 mg of acetaminophen. The most you should take is 10 pills a day (3,250 mg total). Note: In [place], the maximum is 12 pills a day (3,900 mg total). NASAL WASHES - STEP-BY-STEP INSTRUCTIONS: HUMIDIFIER: * If the air is dry, use a humidifier in the bedroom. * Dry air makes coughs worse. CALL BACK IF: * Difficulty breathing occurs * You become worse CARE ADVICE given per Cough - Acute Productive (Adult) guideline. Referrals REFERRED TO PCP OFFICE

## 2021-04-26 ENCOUNTER — Ambulatory Visit: Payer: BC Managed Care – PPO | Admitting: Physical Medicine and Rehabilitation

## 2021-04-26 NOTE — Telephone Encounter (Signed)
Appt scheduled w/ Jodi Mourning 04/27/21.

## 2021-04-27 ENCOUNTER — Encounter: Payer: Self-pay | Admitting: Family

## 2021-04-27 ENCOUNTER — Ambulatory Visit: Payer: BC Managed Care – PPO | Admitting: Family

## 2021-04-27 VITALS — BP 142/90 | HR 90 | Temp 98.4°F | Ht 65.0 in | Wt 208.0 lb

## 2021-04-27 DIAGNOSIS — J019 Acute sinusitis, unspecified: Secondary | ICD-10-CM | POA: Diagnosis not present

## 2021-04-27 DIAGNOSIS — J209 Acute bronchitis, unspecified: Secondary | ICD-10-CM | POA: Diagnosis not present

## 2021-04-27 MED ORDER — AMOXICILLIN-POT CLAVULANATE 875-125 MG PO TABS: 1.0000 | ORAL_TABLET | Freq: Two times a day (BID) | ORAL | 0 refills | Status: AC

## 2021-04-27 NOTE — Progress Notes (Signed)
Makayla Good is a 51 y.o. female with the following history as recorded in EpicCare:  Patient Active Problem List   Diagnosis Date Noted   Multinodular goiter 10/25/2020   Anxiety 10/14/2020   Increased frequency of headaches 09/05/2020   Abnormal CT of the head 09/05/2020   Loud snoring 09/05/2020   Gastroesophageal reflux disease 05/05/2020   Hyperglycemia 05/05/2020   Lower extremity edema 09/22/2019   Lymphedema 09/22/2019   Primary hypertension 08/05/2017   Hypokalemia 08/05/2017   OSA (obstructive sleep apnea) 03/05/2017   Asthma 03/05/2017   Dizziness 02/08/2017   Preventative health care 02/08/2017   Abdominal pain 07/05/2016   RUQ pain 07/05/2016   Irregular periods 07/26/2014   Dysuria 02/04/2014   Severe obesity (BMI >= 40) (HCC) 10/21/2012   OA (osteoarthritis) of knee 06/04/2012   Cough 03/05/2012   Plantar fasciitis 09/04/2010   HYPOKALEMIA 07/01/2009   CHEST PAIN UNSPECIFIED 05/18/2009   DEPRESSION 03/07/2009   FATIGUE 03/07/2009   TOBACCO USER 12/03/2008   Morbid obesity ([place]) 05/21/2008   Hyperlipidemia LDL goal <100 97/67/3419   DYSMETABOLIC SYNDROME X 37/90/2409   CONJUNCTIVITIS NOS 02/04/2007   VAGINITIS NOS 11/07/2006   FUNGAL DERMATITIS 10/09/2006   Benign essential HTN 10/09/2006    Current Outpatient Medications  Medication Sig Dispense Refill   amLODipine (NORVASC) 5 MG tablet Take 1 tablet (5 mg total) by mouth daily. 90 tablet 3   amoxicillin-clavulanate (AUGMENTIN) 875-125 MG tablet Take 1 tablet by mouth 2 (two) times daily for 10 days. 20 tablet 0   ASHWAGANDHA PO Take 1 tablet by mouth daily.     Biotin w/ Vitamins C & E (HAIR/SKIN/NAILS PO) Take 1 tablet by mouth daily.     fluticasone (FLONASE) 50 MCG/ACT nasal spray Place 2 sprays into both nostrils daily. 16 g 6   hydrochlorothiazide (HYDRODIURIL) 25 MG tablet TAKE 1 TABLET BY MOUTH EVERY DAY 90 tablet 1   meclizine (ANTIVERT) 12.5 MG tablet Take 1 tablet (12.5 mg  total) by mouth 3 (three) times daily as needed for dizziness. 30 tablet 0   Multiple Vitamins-Minerals (CELEBRATE MULTI-COMPLETE 36 PO) Take 1 each by mouth daily.     potassium chloride (KLOR-CON) 10 MEQ tablet Take 1 tablet (10 mEq total) by mouth daily. 90 tablet 1   ALPRAZolam (XANAX) 0.25 MG tablet Take 1 tablet (0.25 mg total) by mouth 3 (three) times daily as needed for anxiety. (Patient not taking: Reported on 04/27/2021) 30 tablet 0   busPIRone (BUSPAR) 7.5 MG tablet Take 1 tablet (7.5 mg total) by mouth 2 (two) times daily. (Patient not taking: Reported on 04/27/2021) 60 tablet 2   nicotine (NICODERM CQ - DOSED IN MG/24 HOURS) 14 mg/24hr patch Place 1 patch (14 mg total) onto the skin daily. (Patient not taking: Reported on 04/27/2021) 28 patch 1   No current facility-administered medications for this visit.    Allergies: Patient has no known allergies.  Past Medical History:  Diagnosis Date   Asthma    Eczema    Fibroid    H/O oophorectomy    Hypertension    Obesity    Polycystic ovary    Left   Sleep apnea    no CPAP per MD    Past Surgical History:  Procedure Laterality Date   BACK SURGERY     90yrs ago   BREAST BIOPSY Right 2019   fibroadenoma   CESAREAN SECTION  02/01/2005   CESAREAN SECTION  2001   LAPAROSCOPIC GASTRIC SLEEVE  RESECTION N/A 07/29/2017   Procedure: LAPAROSCOPIC GASTRIC SLEEVE RESECTION WITH UPPER ENDO ;  Surgeon: [name], MD;  Location: [facility];  Service: General;  Laterality: N/A;   OOPHORECTOMY     Rt.removed   TUBAL LIGATION  02/01/2005    Family History  Problem Relation Age of Onset   Hypertension Mother    Schizophrenia Mother    Dementia Mother    Mental illness Mother        schizophrenia, dementia   Hypertension Father    Coronary artery disease Father        Stent   Alzheimer's disease Father    Hypertension Sister    Hypertension Sister    Arthritis Sister        rheumatoid   Dementia Maternal Aunt    Kidney disease  Other    Colon cancer Neg Hx    Stomach cancer Neg Hx    Pancreatic cancer Neg Hx    Esophageal cancer Neg Hx    Rectal cancer Neg Hx     Social History   Tobacco Use   Smoking status: Former    Packs/day: 0.50    Years: 30.00    Pack years: 15.00    Types: Cigarettes    Start date: 04/28/2020   Smokeless tobacco: Never   Tobacco comments:    will try chantix   Substance Use Topics   Alcohol use: Not Currently    Subjective:  Cough/ congestion x 2 weeks; has woken up the past 2 days with night sweats;  +productive cough; occasional vaping; + right sided head congestion/ ear fullness; Using OTC Coricidin;     Objective:  Vitals:   04/27/21 1459  BP: (!) 142/90  Pulse: 90  Temp: 98.4 F (36.9 C)  TempSrc: Oral  SpO2: 98%  Weight: 208 lb (94.3 kg)  Height: 5\' 5"  (1.651 m)    General: Well developed, well nourished, in no acute distress  Skin : Warm and dry.  Head: Normocephalic and atraumatic  Eyes: Sclera and conjunctiva clear; pupils round and reactive to light; extraocular movements intact  Ears: External normal; canals clear; tympanic membranes congested bilaterally Oropharynx: Pink, supple. No suspicious lesions  Neck: Supple without thyromegaly, adenopathy  Lungs: Respirations unlabored; clear to auscultation bilaterally without wheeze, rales, rhonchi  CVS exam: normal rate and regular rhythm.  Neurologic: Alert and oriented; speech intact; face symmetrical; moves all extremities well; CNII-XII intact without focal deficit   Assessment:  1. Acute sinusitis, recurrence not specified, unspecified location   2. Acute bronchitis, unspecified organism     Plan:  Rx for Augmentin 875 mg bid x 10 days, Flonase NS; increase fluids, rest and follow up worse, no better.   This visit occurred during the SARS-CoV-2 public health emergency.  Safety protocols were in place, including screening questions prior to the visit, additional usage of staff PPE, and extensive  cleaning of exam room while observing appropriate contact time as indicated for disinfecting solutions.    No follow-ups on file.  No orders of the defined types were placed in this encounter.   Requested Prescriptions   Signed Prescriptions Disp Refills   amoxicillin-clavulanate (AUGMENTIN) 875-125 MG tablet 20 tablet 0    Sig: Take 1 tablet by mouth 2 (two) times daily for 10 days.

## 2021-05-04 ENCOUNTER — Ambulatory Visit: Payer: BC Managed Care – PPO | Admitting: Physical Medicine and Rehabilitation

## 2021-05-04 ENCOUNTER — Other Ambulatory Visit: Payer: Self-pay

## 2021-05-04 ENCOUNTER — Encounter: Payer: Self-pay | Admitting: Physical Medicine and Rehabilitation

## 2021-05-04 VITALS — BP 129/84 | HR 89

## 2021-05-04 DIAGNOSIS — M542 Cervicalgia: Secondary | ICD-10-CM | POA: Diagnosis not present

## 2021-05-04 DIAGNOSIS — M25512 Pain in left shoulder: Secondary | ICD-10-CM

## 2021-05-04 DIAGNOSIS — M25511 Pain in right shoulder: Secondary | ICD-10-CM | POA: Diagnosis not present

## 2021-05-04 DIAGNOSIS — G8929 Other chronic pain: Secondary | ICD-10-CM

## 2021-05-04 DIAGNOSIS — G5601 Carpal tunnel syndrome, right upper limb: Secondary | ICD-10-CM

## 2021-05-04 DIAGNOSIS — M7918 Myalgia, other site: Secondary | ICD-10-CM

## 2021-05-04 NOTE — Progress Notes (Signed)
[name] - 52 y.o. female MRN [id number]  Date of birth: 12-13-1970  Office Visit Note: Visit Date: 05/04/2021 PCP: [name], DO Referred by: [name], *  Subjective: Chief Complaint  Patient presents with   Neck - Pain   Left Shoulder - Pain   Left Arm - Pain   HPI: Makayla Good is a 51 y.o. female who comes in today for evaluation of chronic, worsening and severe bilateral neck pain radiating to shoulders. Patient reports pain has been ongoing for several months. Patient states pain is exacerbated by movement and activity, describes as a constant soreness and cramping sensation, currently rates as 7 out of 10. 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 Patient reports some relief of pain with home exercise regimen, use of heating pad and OTC medications as needed. Patient does states she takes frequent breaks at work to help ease her pain. Patient does not have formal imaging of her cervical spine at this time. She was previously seen by Erskine Emery, PA-C for evaluation of left arm numbness and tingling. Patient had bilateral upper extremity NCV with EMG on 01/10/2021 that exhibits evidence of a mild right median nerve entrapment at the wrist (carpal tunnel syndrome) affecting sensory components. There is no significant electrodiagnostic evidence of any other focal nerve entrapment, brachial plexopathy or cervical radiculopathy in either upper limb. Patient did follow up with Erskine Emery, PA-C after the nerve study was complete, however her symptoms were resolving and elected not to proceed with surgical intervention at that time. Patient states her pain is becoming more severe and is making it difficult for her to perform daily tasks and to complete job duties. Patient denies focal weakness, numbness and tingling. Patient denies  recent trauma or falls.   Review of Systems  Musculoskeletal:  Positive for myalgias and neck pain.  Neurological:  Negative for tingling, sensory change, focal weakness and weakness.  All other systems reviewed and are negative. Otherwise per HPI.  Assessment & Plan: Visit Diagnoses:    ICD-10-CM   1. Cervicalgia  M54.2 Ambulatory referral to Physical Therapy    2. Chronic pain of both shoulders  M25.511    G89.29    M25.512     3. Myofascial pain syndrome  M79.18 Ambulatory referral to Physical Therapy    4. Carpal tunnel syndrome of right wrist  G56.01        Plan: Findings:  Chronic, worsening and severe bilateral neck pain radiating to shoulders. Patient continues to have severe pain despite good conservative therapies such as home exercise regimen, use of heating pad and medications. Patients clinical presentation and exam are consistent with myofascial pain. She does have multiple areas of tenderness to bilateral levator scapulae and trapezius muscles upon palpation. We feel the next step is to place referral for formal physical therapy with our in-house team, which we did complete today. We feel that patient would benefit from manual treatments and dry needling. We also encouraged patient to try TENS unit at home. Patient encouraged to take frequent breaks at work as needed. We will have her follow-up with [place] after several sessions of physical therapy to re-assess her pain. If patient continues to have pain after PT we would consider obtaining cervical MRI images. We did encourage patient to follow-up with Erskine Emery, PA-C if she begins having right wrist  pain as this could be consistent with carpal tunnel and could need surgical intervention. No red flag symptoms noted upon exam today.    Meds & Orders: No orders of the defined types were placed in this encounter.   Orders Placed This Encounter  Procedures   Ambulatory referral to Physical Therapy    Follow-up: Return for  follow-up after several sessions of PT are completed.   Procedures: No procedures performed      Clinical History: 01/10/2021 Bilateral Upper Extremity NCV with EMG  Plan: Impression: The above electrodiagnostic study is ABNORMAL and reveals evidence of a mild right median nerve entrapment at the wrist (carpal tunnel syndrome) affecting sensory components.    There is no significant electrodiagnostic evidence of any other focal nerve entrapment, brachial plexopathy or cervical radiculopathy in either upper limb.   She reports that she has quit smoking. Her smoking use included cigarettes. She started smoking about a year ago. She has a 15.00 pack-year smoking history. She has never used smokeless tobacco.  No results for input(s): HGBA1C, LABURIC in the last 8760 hours.   Objective:  VS:  HT:     WT:    BMI:      BP:129/84   HR:89bpm   TEMP: ( )   RESP:  Physical Exam Vitals and nursing note reviewed.  HENT:     Head: Normocephalic and atraumatic.     Right Ear: External ear normal.     Left Ear: External ear normal.     Nose: Nose normal.     Mouth/Throat:     Mouth: Mucous membranes are moist.  Eyes:     Extraocular Movements: Extraocular movements intact.  Cardiovascular:     Rate and Rhythm: Normal rate.     Pulses: Normal pulses.  Pulmonary:     Effort: Pulmonary effort is normal.  Abdominal:     General: Abdomen is flat. There is no distension.  Musculoskeletal:        General: Tenderness present.     Cervical back: Normal range of motion.     Comments: No discomfort noted with flexion, extension and side-to-side rotation. Patient has good strength in the upper extremities including 5 out of 5 strength in wrist extension, long finger flexion and APB.  There is no atrophy of the hands intrinsically.  Sensation intact bilaterally. Tenderness noted upon palpation of bilateral levator scapulae and trapezius muscles. Negative Hoffman's sign. Negative Spurling's sign.     Skin:    General: Skin is warm and dry.     Capillary Refill: Capillary refill takes less than 2 seconds.  Neurological:     General: No focal deficit present.     Mental Status: She is alert and oriented to person, place, and time.  Psychiatric:        Mood and Affect: Mood normal.    Ortho Exam  Imaging: No results found.  Past Medical/Family/Surgical/Social History: Medications & Allergies reviewed per EMR, new medications updated. Patient Active Problem List   Diagnosis Date Noted   Multinodular goiter 10/25/2020   Anxiety 10/14/2020   Increased frequency of headaches 09/05/2020   Abnormal CT of the head 09/05/2020   Loud snoring 09/05/2020   Gastroesophageal reflux disease 05/05/2020   Hyperglycemia 05/05/2020   Lower extremity edema 09/22/2019   Lymphedema 09/22/2019   Primary hypertension 08/05/2017   Hypokalemia 08/05/2017   OSA (obstructive sleep apnea) 03/05/2017   Asthma 03/05/2017   Dizziness 02/08/2017   Preventative health care 02/08/2017  Abdominal pain 07/05/2016   RUQ pain 07/05/2016   Irregular periods 07/26/2014   Dysuria 02/04/2014   Severe obesity (BMI >= 40) ([place]) 10/21/2012   OA (osteoarthritis) of knee 06/04/2012   Cough 03/05/2012   Plantar fasciitis 09/04/2010   HYPOKALEMIA 07/01/2009   CHEST PAIN UNSPECIFIED 05/18/2009   DEPRESSION 03/07/2009   FATIGUE 03/07/2009   TOBACCO USER 12/03/2008   Morbid obesity ([place]) 05/21/2008   Hyperlipidemia LDL goal <100 90/24/0973   DYSMETABOLIC SYNDROME X 53/29/9242   CONJUNCTIVITIS NOS 02/04/2007   VAGINITIS NOS 11/07/2006   FUNGAL DERMATITIS 10/09/2006   Benign essential HTN 10/09/2006   Past Medical History:  Diagnosis Date   Asthma    Eczema    Fibroid    H/O oophorectomy    Hypertension    Obesity    Polycystic ovary    Left   Sleep apnea    no CPAP per MD   Family History  Problem Relation Age of Onset   Hypertension Mother    Schizophrenia Mother    Dementia Mother    Mental  illness Mother        schizophrenia, dementia   Hypertension Father    Coronary artery disease Father        Stent   Alzheimer's disease Father    Hypertension Sister    Hypertension Sister    Arthritis Sister        rheumatoid   Dementia Maternal Aunt    Kidney disease Other    Colon cancer Neg Hx    Stomach cancer Neg Hx    Pancreatic cancer Neg Hx    Esophageal cancer Neg Hx    Rectal cancer Neg Hx    Past Surgical History:  Procedure Laterality Date   BACK SURGERY     7yrs ago   BREAST BIOPSY Right 2019   fibroadenoma   CESAREAN SECTION  02/01/2005   CESAREAN SECTION  2001   LAPAROSCOPIC GASTRIC SLEEVE RESECTION N/A 07/29/2017   Procedure: LAPAROSCOPIC GASTRIC SLEEVE RESECTION WITH UPPER ENDO ;  Surgeon: [name], MD;  Location: [facility];  Service: General;  Laterality: N/A;   OOPHORECTOMY     Rt.removed   TUBAL LIGATION  02/01/2005   Social History   Occupational History   Occupation: release liens    Employer: [employer]  Tobacco Use   Smoking status: Former    Packs/day: 0.50    Years: 30.00    Pack years: 15.00    Types: Cigarettes    Start date: 04/28/2020   Smokeless tobacco: Never   Tobacco comments:    will try chantix   Vaping Use   Vaping Use: Some days  Substance and Sexual Activity   Alcohol use: Not Currently   Drug use: No   Sexual activity: Not Currently    Partners: Male    Birth control/protection: Surgical    Comment: Tubal/been with same person over 20 years

## 2021-05-04 NOTE — Progress Notes (Signed)
Pt state neck pain that travels down her left shoulder and arm. Pt state sometime her left feels heavy and reach around back makes the pain worse. Pt state she takes over the counter pain meds to help ease her pain.  Numeric Pain Rating Scale and Functional Assessment Average Pain 6 Pain Right Now 2 My pain is intermittent and aching Pain is worse with: some activites Pain improves with: medication   In the last MONTH (on 0-10 scale) has pain interfered with the following?  1. General activity like being  able to carry out your everyday physical activities such as walking, climbing stairs, carrying groceries, or moving a chair?  Rating(7)  2. Relation with others like being able to carry out your usual social activities and roles such as  activities at home, at work and in your community. Rating(8)  3. Enjoyment of life such that you have  been bothered by emotional problems such as feeling anxious, depressed or irritable?  Rating(9)

## 2021-05-05 ENCOUNTER — Encounter: Payer: Self-pay | Admitting: Physical Medicine and Rehabilitation

## 2021-05-06 ENCOUNTER — Other Ambulatory Visit: Payer: Self-pay | Admitting: Family Medicine

## 2021-05-06 DIAGNOSIS — F419 Anxiety disorder, unspecified: Secondary | ICD-10-CM

## 2021-05-10 ENCOUNTER — Telehealth: Payer: Self-pay | Admitting: Family Medicine

## 2021-05-10 DIAGNOSIS — I10 Essential (primary) hypertension: Secondary | ICD-10-CM

## 2021-05-10 MED ORDER — AMLODIPINE BESYLATE 5 MG PO TABS: 5.0000 mg | ORAL_TABLET | Freq: Every day | ORAL | 1 refills | Status: DC

## 2021-05-10 NOTE — Telephone Encounter (Signed)
Medication: amLODipine (NORVASC) 5 MG tablet   Has the patient contacted their pharmacy? Yes.   (If no, request that the patient contact the pharmacy for the refill.) (If yes, when and what did the pharmacy advise?)  Preferred Pharmacy (with phone number or street name):  [id number] [address], [place].  [address] [place] [address]  Phone:  [phone]  Fax:  [phone]   Agent: Please be advised that RX refills may take up to 3 business days. We ask that you follow-up with your pharmacy.

## 2021-05-10 NOTE — Telephone Encounter (Signed)
Refill sent.

## 2021-05-11 ENCOUNTER — Other Ambulatory Visit: Payer: Self-pay | Admitting: Family Medicine

## 2021-05-11 DIAGNOSIS — J029 Acute pharyngitis, unspecified: Secondary | ICD-10-CM

## 2021-05-12 DIAGNOSIS — F4323 Adjustment disorder with mixed anxiety and depressed mood: Secondary | ICD-10-CM | POA: Diagnosis not present

## 2021-05-22 ENCOUNTER — Ambulatory Visit: Payer: BC Managed Care – PPO | Admitting: Physical Therapy

## 2021-05-22 ENCOUNTER — Encounter: Payer: Self-pay | Admitting: Physical Therapy

## 2021-05-22 ENCOUNTER — Other Ambulatory Visit: Payer: Self-pay

## 2021-05-22 DIAGNOSIS — M25512 Pain in left shoulder: Secondary | ICD-10-CM

## 2021-05-22 DIAGNOSIS — G8929 Other chronic pain: Secondary | ICD-10-CM | POA: Diagnosis not present

## 2021-05-22 DIAGNOSIS — M6281 Muscle weakness (generalized): Secondary | ICD-10-CM | POA: Diagnosis not present

## 2021-05-22 DIAGNOSIS — M542 Cervicalgia: Secondary | ICD-10-CM

## 2021-05-22 NOTE — Therapy (Signed)
OUTPATIENT PHYSICAL THERAPY CERVICAL EVALUATION   Patient Name: Makayla Good MRN: [id number] DOB:1970/06/21, 51 y.o., female Today's Date: 05/22/2021   PT End of Session - 05/22/21 1616     Visit Number 1    Number of Visits 7    Date for PT Re-Evaluation 07/03/21    PT Start Time 5366    PT Stop Time 1605    PT Time Calculation (min) 50 min    Activity Tolerance Patient tolerated treatment well    Behavior During Therapy WFL for tasks assessed/performed             Past Medical History:  Diagnosis Date   Asthma    Eczema    Fibroid    H/O oophorectomy    Hypertension    Obesity    Polycystic ovary    Left   Sleep apnea    no CPAP per MD   Past Surgical History:  Procedure Laterality Date   BACK SURGERY     56yrs ago   BREAST BIOPSY Right 2019   fibroadenoma   CESAREAN SECTION  02/01/2005   CESAREAN SECTION  2001   LAPAROSCOPIC GASTRIC SLEEVE RESECTION N/A 07/29/2017   Procedure: LAPAROSCOPIC GASTRIC SLEEVE RESECTION WITH UPPER ENDO ;  Surgeon: [name], MD;  Location: [facility];  Service: General;  Laterality: N/A;   OOPHORECTOMY     Rt.removed   TUBAL LIGATION  02/01/2005   Patient Active Problem List   Diagnosis Date Noted   Multinodular goiter 10/25/2020   Anxiety 10/14/2020   Increased frequency of headaches 09/05/2020   Abnormal CT of the head 09/05/2020   Loud snoring 09/05/2020   Gastroesophageal reflux disease 05/05/2020   Hyperglycemia 05/05/2020   Lower extremity edema 09/22/2019   Lymphedema 09/22/2019   Primary hypertension 08/05/2017   Hypokalemia 08/05/2017   OSA (obstructive sleep apnea) 03/05/2017   Asthma 03/05/2017   Dizziness 02/08/2017   Preventative health care 02/08/2017   Abdominal pain 07/05/2016   RUQ pain 07/05/2016   Irregular periods 07/26/2014   Dysuria 02/04/2014   Severe obesity (BMI >= 40) ([place]) 10/21/2012   OA (osteoarthritis) of knee 06/04/2012   Cough 03/05/2012   Plantar fasciitis  09/04/2010   HYPOKALEMIA 07/01/2009   CHEST PAIN UNSPECIFIED 05/18/2009   DEPRESSION 03/07/2009   FATIGUE 03/07/2009   TOBACCO USER 12/03/2008   Morbid obesity ([place]) 05/21/2008   Hyperlipidemia LDL goal <100 44/06/4740   DYSMETABOLIC SYNDROME X 59/56/3875   CONJUNCTIVITIS NOS 02/04/2007   VAGINITIS NOS 11/07/2006   FUNGAL DERMATITIS 10/09/2006   Benign essential HTN 10/09/2006    PCP: [name], DO  REFERRING PROVIDER: Lorine Bears, NP  REFERRING DIAG: M54.2 (ICD-10-CM) - Cervicalgia M79.18 (ICD-10-CM) - Myofascial pain syndrome   THERAPY DIAG:  Cervicalgia  Muscle weakness (generalized)  Chronic left shoulder pain  ONSET DATE: a few months ago  SUBJECTIVE:  SUBJECTIVE STATEMENT: Pt arriving reporting 6 month history of neck and left shoulder pain. Pt stating sleeping can be difficult. Pt stating she is able to perform lifting and ADL's, but reporting painful with certain movements and positions.  PERTINENT HISTORY:  Asthma Eczema Fibroid H/O oophorectomy Hypertension Obesity Polycystic ovaryLeft Sleep apnea   PAIN:  Are you having pain? Yes NPRS scale:  4/10 Pain location: left shoulder, upper trap Pain orientation: Left  PAIN TYPE: aching Pain description: intermittent  Aggravating factors: sitting at computer all day at work, sleeping on my left side Relieving factors: changing positions, getting up walking, stretching  PRECAUTIONS: None  WEIGHT BEARING RESTRICTIONS No  FALLS:  Has patient fallen in last 6 months? No    LIVING ENVIRONMENT: Lives with: lives with their family and lives with their spouse Lives in: House/apartment Stairs: No;  Has following equipment at home: None  OCCUPATION: I work for [employer]  PLOF:  Independent  PATIENT GOALS Stop hurting, be able to work without pain  OBJECTIVE:        PATIENT SURVEYS:  05/22/2021 FOTO 72% (predicted 75%)   COGNITION: Overall cognitive status: Within functional limits for tasks assessed   SENSATION: Light touch: Appears intact   POSTURE:  Forward head and rounded shoulder  PALPATION: TTP: left supraspinatus tendon, left upper trap, anterior deltoid   CERVICAL AROM/PROM  AROM Active (deg) 05/22/2021  Flexion 35   Extension 20       Left lateral flexion 35   Right rotation 55   Left rotation 50    (Blank rows = not tested)  UE AROM/PROM:  Active Right 05/22/2021 Left 05/22/2021  Shoulder flexion 170 160   Shoulder extension 48 45   Shoulder abduction 160 158   Shoulder adduction        Shoulder internal rotation 90 shoulder at 90 degrees abduction 90 Shoulder at 90 degrees abduction  Shoulder external rotation 80 Shoulder at 90 degrees abduction 80 Shoulder at 90 degrees abduction                                   (Blank rows = not tested)  UE MMT:  MMT Right 05/22/2021 Left 05/22/2021  Shoulder flexion 5 4  Shoulder extension 5 4  Shoulder abduction 5 4  Shoulder adduction        Shoulder internal rotation 5 4  Shoulder external rotation 5 4  Middle trapezius    Lower trapezius    Elbow flexion    Elbow extension    Wrist flexion    Wrist extension    Wrist ulnar deviation    Wrist radial deviation    Wrist pronation    Wrist supination    Grip strength 43ppsi 40ppsi   (Blank rows = not tested)     FUNCTIONAL TESTS:  5 times sit to stand: 13 seconds no UE support    TODAY'S TREATMENT:  05/22/2021:  There ex:  rows x 10 holding 3 seconds Upper trap stretch x 2 holding 10 seconds Cervical retraction x 2 holding 10 seconds Door stretch x 2 holding 20 seconds arms at 60 degrees x 120 degrees                                        PATIENT EDUCATION:  Education details: PT POC,  HEP,  DN handout issued and discussed about future treatment Person educated: Patient Education method: Explanation Education comprehension: returned demonstration, verbal cues required, and tactile cues required   HOME EXERCISE PROGRAM: Access Code: [email or web address] URL: [email or web address] Date: 05/22/2021 Prepared by: Kearney Hard  Exercises Standing Shoulder Row with Anchored Resistance - 2-3 x daily - 7 x weekly - 2 sets - 10 reps - 3 seconds hold Supine Cervical Retraction with Towel - 2-3 x daily - 7 x weekly - 10 reps - 5- 10 seconds hold Thoracic Extension Mobilization on Foam Roll - 2-3 x daily - 7 x weekly - 5 reps - 10-20 seconds hold Doorway Pec Stretch at 60 Elevation - 2-3 x daily - 7 x weekly - 3-5 reps - 20 seconds hold Doorway Pec Stretch at 120 Degrees Abduction - 2-3 x daily - 7 x weekly - 3-5 reps - 20 seconds hold Seated Upper Trap Stretch - 2-3 x daily - 7 x weekly - 3-5 reps - 10 seconds hold   ASSESSMENT:  CLINICAL IMPRESSION: Patient is a 51 y.o. female who was seen today for physical therapy evaluation and treatment for cervical spine and left shoulder pain. Pt presenting with painful sleep and with functional mobility and work.  Objective impairments include decreased activity tolerance, decreased mobility, decreased ROM, decreased strength, impaired UE functional use, postural dysfunction, and pain. These impairments are limiting patient from community activity and occupation. Personal factors including 1-2 comorbidities:   Asthma Eczema Fibroid H/O oophorectomy Hypertension Obesity Polycystic ovaryLeft Sleep apnea  are also affecting patient's functional outcome. Patient will benefit from skilled PT to address above impairments and improve overall function.  REHAB POTENTIAL: Good  CLINICAL DECISION MAKING: Stable/uncomplicated  EVALUATION COMPLEXITY: Low   GOALS: Goals reviewed with patient? Yes  SHORT TERM GOALS:  STG Name Target Date Goal  status  1 Pt will be independent in her initial HEP.   06/12/2021 INITIAL  LONG TERM GOALS:   LTG Name Target Date Goal status  1 Pt will be independent in her advanced HEP and progression.   07/03/2021 INITIAL  2 Pt will be able to improve her bilateral cervical rotation to >/= 65 degrees for improved functional mobility.  07/03/2021 INITIAL  3 Pt will report pain of </= 3/10 after working full shift.   07/03/2021 INITIAL  4 Pt will improve her left UE strength to grossly 5/5 in order to improve functional mobility.    07/03/2021 INITIAL  5 Pt will improve her FOTO score >/= 75%   Baseline: 07/03/2021 INITIAL             PLAN: PT FREQUENCY: 1x/week  PT DURATION: 6 weeks  PLANNED INTERVENTIONS: Therapeutic exercises, Therapeutic activity, Neuro Muscular re-education, Balance training, Gait training, Patient/Family education, Joint mobilization, Dry Needling, Spinal mobilization, Cryotherapy, Moist heat, Traction, and Manual therapy  PLAN FOR NEXT SESSION: cervical ROM, UBE, levator stretches, cervival mobs, consider DN upper trap/deltoid if appropriate   Oretha Caprice, PT, MPT 05/22/2021, 4:51 PM

## 2021-06-01 ENCOUNTER — Ambulatory Visit: Payer: BC Managed Care – PPO | Admitting: Rehabilitative and Restorative Service Providers"

## 2021-06-01 ENCOUNTER — Other Ambulatory Visit: Payer: Self-pay

## 2021-06-01 ENCOUNTER — Encounter: Payer: Self-pay | Admitting: Rehabilitative and Restorative Service Providers"

## 2021-06-01 DIAGNOSIS — M542 Cervicalgia: Secondary | ICD-10-CM | POA: Diagnosis not present

## 2021-06-01 DIAGNOSIS — M6281 Muscle weakness (generalized): Secondary | ICD-10-CM | POA: Diagnosis not present

## 2021-06-01 NOTE — Therapy (Deleted)
OUTPATIENT PHYSICAL THERAPY TREATMENT NOTE   Patient Name: Makayla Good MRN: [id number] DOB:Dec 17, 1970, 51 y.o., female Today's Date: 06/01/2021  PCP: [name], DO REFERRING PROVIDER: Lorine Bears, NP    Past Medical History:  Diagnosis Date   Asthma    Eczema    Fibroid    H/O oophorectomy    Hypertension    Obesity    Polycystic ovary    Left   Sleep apnea    no CPAP per MD   Past Surgical History:  Procedure Laterality Date   BACK SURGERY     22yrs ago   BREAST BIOPSY Right 2019   fibroadenoma   CESAREAN SECTION  02/01/2005   CESAREAN SECTION  2001   LAPAROSCOPIC GASTRIC SLEEVE RESECTION N/A 07/29/2017   Procedure: LAPAROSCOPIC GASTRIC SLEEVE RESECTION WITH UPPER ENDO ;  Surgeon: [name], MD;  Location: [facility];  Service: General;  Laterality: N/A;   OOPHORECTOMY     Rt.removed   TUBAL LIGATION  02/01/2005   Patient Active Problem List   Diagnosis Date Noted   Multinodular goiter 10/25/2020   Anxiety 10/14/2020   Increased frequency of headaches 09/05/2020   Abnormal CT of the head 09/05/2020   Loud snoring 09/05/2020   Gastroesophageal reflux disease 05/05/2020   Hyperglycemia 05/05/2020   Lower extremity edema 09/22/2019   Lymphedema 09/22/2019   Primary hypertension 08/05/2017   Hypokalemia 08/05/2017   OSA (obstructive sleep apnea) 03/05/2017   Asthma 03/05/2017   Dizziness 02/08/2017   Preventative health care 02/08/2017   Abdominal pain 07/05/2016   RUQ pain 07/05/2016   Irregular periods 07/26/2014   Dysuria 02/04/2014   Severe obesity (BMI >= 40) ([place]) 10/21/2012   OA (osteoarthritis) of knee 06/04/2012   Cough 03/05/2012   Plantar fasciitis 09/04/2010   HYPOKALEMIA 07/01/2009   CHEST PAIN UNSPECIFIED 05/18/2009   DEPRESSION 03/07/2009   FATIGUE 03/07/2009   TOBACCO USER 12/03/2008   Morbid obesity ([place]) 05/21/2008   Hyperlipidemia LDL goal <100 00/37/0488   DYSMETABOLIC SYNDROME X 89/16/9450    CONJUNCTIVITIS NOS 02/04/2007   VAGINITIS NOS 11/07/2006   FUNGAL DERMATITIS 10/09/2006   Benign essential HTN 10/09/2006    REFERRING DIAG: ***  THERAPY DIAG:  Cervicalgia  Muscle weakness (generalized)  PERTINENT HISTORY: ***  PRECAUTIONS: ***  SUBJECTIVE: ***  PAIN:  Are you having pain? {yes/no:20286} NPRS scale: ***/10 Pain location: *** Pain orientation: {Pain Orientation:25161}  PAIN TYPE: {type:313116} Pain description: {PAIN DESCRIPTION:21022940}  Aggravating factors: *** Relieving factors: ***   OBJECTIVE:         PATIENT SURVEYS:  05/22/2021 FOTO 72% (predicted 75%)     COGNITION: Overall cognitive status: Within functional limits for tasks assessed            SENSATION: Light touch: Appears intact     POSTURE:  Forward head and rounded shoulder   PALPATION: TTP: left supraspinatus tendon, left upper trap, anterior deltoid          CERVICAL AROM/PROM   AROM Active (deg) 05/22/2021  Flexion 35   Extension 20          Left lateral flexion 35   Right rotation 55   Left rotation 50    (Blank rows = not tested)   UE AROM/PROM:   Active Right 05/22/2021 Left 05/22/2021  Shoulder flexion 170 160   Shoulder extension 48 45    Shoulder abduction 160 158    Shoulder adduction  Shoulder internal rotation 90 shoulder at 90 degrees abduction 90 Shoulder at 90 degrees abduction  Shoulder external rotation 80 Shoulder at 90 degrees abduction 80 Shoulder at 90 degrees abduction                                                           (Blank rows = not tested)   UE MMT:   MMT Right 05/22/2021 Left 05/22/2021  Shoulder flexion 5 4  Shoulder extension 5 4  Shoulder abduction 5 4  Shoulder adduction             Shoulder internal rotation 5 4  Shoulder external rotation 5 4  Middle trapezius      Lower trapezius      Elbow flexion      Elbow extension      Wrist flexion      Wrist extension      Wrist ulnar  deviation      Wrist radial deviation      Wrist pronation      Wrist supination      Grip strength 43ppsi 40ppsi   (Blank rows = not tested)         FUNCTIONAL TESTS:  5 times sit to stand: 13 seconds no UE support       TODAY'S TREATMENT:  05/22/2021:  There ex:  rows x 10 holding 3 seconds Upper trap stretch x 2 holding 10 seconds Cervical retraction x 2 holding 10 seconds Door stretch x 2 holding 20 seconds arms at 60 degrees x 120 degrees                                          PATIENT EDUCATION:  Education details: PT POC, HEP, DN handout issued and discussed about future treatment Person educated: Patient Education method: Explanation Education comprehension: returned demonstration, verbal cues required, and tactile cues required     HOME EXERCISE PROGRAM: Access Code: [email or web address] URL: [email or web address] Date: 05/22/2021 Prepared by: Kearney Hard   Exercises Standing Shoulder Row with Anchored Resistance - 2-3 x daily - 7 x weekly - 2 sets - 10 reps - 3 seconds hold Supine Cervical Retraction with Towel - 2-3 x daily - 7 x weekly - 10 reps - 5- 10 seconds hold Thoracic Extension Mobilization on Foam Roll - 2-3 x daily - 7 x weekly - 5 reps - 10-20 seconds hold Doorway Pec Stretch at 60 Elevation - 2-3 x daily - 7 x weekly - 3-5 reps - 20 seconds hold Doorway Pec Stretch at 120 Degrees Abduction - 2-3 x daily - 7 x weekly - 3-5 reps - 20 seconds hold Seated Upper Trap Stretch - 2-3 x daily - 7 x weekly - 3-5 reps - 10 seconds hold     ASSESSMENT:   CLINICAL IMPRESSION: Patient is a 51 y.o. female who was seen today for physical therapy evaluation and treatment for cervical spine and left shoulder pain. Pt presenting with painful sleep and with functional mobility and work.  Objective impairments include decreased activity tolerance, decreased mobility, decreased ROM, decreased strength, impaired UE functional use, postural dysfunction, and  pain. These impairments are limiting patient from community activity and occupation. Personal  factors including 1-2 comorbidities:   Asthma Eczema Fibroid H/O oophorectomy Hypertension Obesity Polycystic ovaryLeft Sleep apnea  are also affecting patient's functional outcome. Patient will benefit from skilled PT to address above impairments and improve overall function.   REHAB POTENTIAL: Good   CLINICAL DECISION MAKING: Stable/uncomplicated   EVALUATION COMPLEXITY: Low     GOALS: Goals reviewed with patient? Yes   SHORT TERM GOALS:   STG Name Target Date Goal status  1 Pt will be independent in her initial HEP.    06/12/2021 INITIAL  LONG TERM GOALS:    LTG Name Target Date Goal status  1 Pt will be independent in her advanced HEP and progression.    07/03/2021 INITIAL  2 Pt will be able to improve her bilateral cervical rotation to >/= 65 degrees for improved functional mobility.  07/03/2021 INITIAL  3 Pt will report pain of </= 3/10 after working full shift.    07/03/2021 INITIAL  4 Pt will improve her left UE strength to grossly 5/5 in order to improve functional mobility.      07/03/2021 INITIAL  5 Pt will improve her FOTO score >/= 75%    Baseline: 07/03/2021 INITIAL                      PLAN: PT FREQUENCY: 1x/week   PT DURATION: 6 weeks   PLANNED INTERVENTIONS: Therapeutic exercises, Therapeutic activity, Neuro Muscular re-education, Balance training, Gait training, Patient/Family education, Joint mobilization, Dry Needling, Spinal mobilization, Cryotherapy, Moist heat, Traction, and Manual therapy   PLAN FOR NEXT SESSION: cervical ROM, UBE, levator stretches, cervival mobs, consider DN upper trap/deltoid if appropriate    Access Code: [email or web address] URL: [email or web address] Date: 06/01/2021 Prepared by: Vista Mink  Exercises Standing Shoulder Row with Anchored Resistance - 2-3 x daily - 7 x weekly - 2 sets - 10 reps - 3 seconds hold Supine Cervical  Retraction with Towel - 2-3 x daily - 7 x weekly - 10 reps - 5- 10 seconds hold Thoracic Extension Mobilization on Foam Roll - 2-3 x daily - 7 x weekly - 5 reps - 10-20 seconds hold Doorway Pec Stretch at 60 Elevation - 2-3 x daily - 7 x weekly - 3-5 reps - 20 seconds hold Doorway Pec Stretch at 120 Degrees Abduction - 2-3 x daily - 7 x weekly - 3-5 reps - 20 seconds hold Seated Upper Trap Stretch - 2-3 x daily - 7 x weekly - 4-5 reps - 10 seconds hold Standing Shoulder Internal Rotation Stretch with Hands Behind Back - 2-3 x daily - 7 x weekly - 1 sets - 10 reps - 10 seconds hold Standing Scapular Retraction - 5 x daily - 7 x weekly - 1 sets - 5 reps - 5 second hold Shoulder External Rotation with Anchored Resistance - 1 x daily - 7 x weekly - 2 sets - 10 reps - 3 hold   Farley Ly, PT 06/01/2021, 2:30 PM

## 2021-06-01 NOTE — Therapy (Signed)
OUTPATIENT PHYSICAL THERAPY TREATMENT NOTE   Patient Name: Makayla Good MRN: [id number] DOB:1970/06/05, 51 y.o., female Today's Date: 06/01/2021  PCP: Makayla Held, DO REFERRING PROVIDER: Lorine Bears, NP   PT End of Session - 06/01/21 1650     Visit Number 2    Number of Visits 7    Date for PT Re-Evaluation 07/03/21    PT Start Time 0272    PT Stop Time 1430    PT Time Calculation (min) 45 min    Activity Tolerance Patient tolerated treatment well;No increased pain    Behavior During Therapy WFL for tasks assessed/performed             Past Medical History:  Diagnosis Date   Asthma    Eczema    Fibroid    H/O oophorectomy    Hypertension    Obesity    Polycystic ovary    Left   Sleep apnea    no CPAP per MD   Past Surgical History:  Procedure Laterality Date   BACK SURGERY     35yrs ago   BREAST BIOPSY Right 2019   fibroadenoma   CESAREAN SECTION  02/01/2005   CESAREAN SECTION  2001   LAPAROSCOPIC GASTRIC SLEEVE RESECTION N/A 07/29/2017   Procedure: LAPAROSCOPIC GASTRIC SLEEVE RESECTION WITH UPPER ENDO ;  Surgeon: [name], MD;  Location: [facility];  Service: General;  Laterality: N/A;   OOPHORECTOMY     Rt.removed   TUBAL LIGATION  02/01/2005   Patient Active Problem List   Diagnosis Date Noted   Multinodular goiter 10/25/2020   Anxiety 10/14/2020   Increased frequency of headaches 09/05/2020   Abnormal CT of the head 09/05/2020   Loud snoring 09/05/2020   Gastroesophageal reflux disease 05/05/2020   Hyperglycemia 05/05/2020   Lower extremity edema 09/22/2019   Lymphedema 09/22/2019   Primary hypertension 08/05/2017   Hypokalemia 08/05/2017   OSA (obstructive sleep apnea) 03/05/2017   Asthma 03/05/2017   Dizziness 02/08/2017   Preventative health care 02/08/2017   Abdominal pain 07/05/2016   RUQ pain 07/05/2016   Irregular periods 07/26/2014   Dysuria 02/04/2014   Severe obesity (BMI >= 40) (HCC) 10/21/2012    OA (osteoarthritis) of knee 06/04/2012   Cough 03/05/2012   Plantar fasciitis 09/04/2010   HYPOKALEMIA 07/01/2009   CHEST PAIN UNSPECIFIED 05/18/2009   DEPRESSION 03/07/2009   FATIGUE 03/07/2009   TOBACCO USER 12/03/2008   Morbid obesity ([place]) 05/21/2008   Hyperlipidemia LDL goal <100 53/66/4403   DYSMETABOLIC SYNDROME X 47/42/5956   CONJUNCTIVITIS NOS 02/04/2007   VAGINITIS NOS 11/07/2006   FUNGAL DERMATITIS 10/09/2006   Benign essential HTN 10/09/2006    REFERRING DIAG: M54.2 (ICD-10-CM) - Cervicalgia M79.18 (ICD-10-CM) - Myofascial pain syndrome   THERAPY DIAG:  Cervicalgia  Muscle weakness (generalized)  PERTINENT HISTORY: See above  PRECAUTIONS: Cervical (postural) and shoulder (impingement)  SUBJECTIVE: Makayla Good notes good early HEP compliance.  PAIN:  Are you having pain? Yes NPRS scale: 5/10 Pain location: Neck and shoulder Pain orientation: Left  PAIN TYPE: aching Pain description: intermittent  Aggravating factors: Prolonged postures and impingement type postures Relieving factors: Postural correction and avoiding impingement postures     OBJECTIVE:         PATIENT SURVEYS:  05/22/2021 FOTO 72% (predicted 75%)     COGNITION: Overall cognitive status: Within functional limits for tasks assessed            SENSATION: Light touch: Appears intact  POSTURE:  Forward head and rounded shoulder   PALPATION: TTP: left supraspinatus tendon, left upper trap, anterior deltoid          CERVICAL AROM/PROM   AROM Active (deg) 05/22/2021  Flexion 35   Extension 20          Left lateral flexion 35   Right rotation 55   Left rotation 50    (Blank rows = not tested)   UE AROM/PROM:   Active Right 05/22/2021 Left 05/22/2021  Shoulder flexion 180 170   Shoulder extension 48 45    Shoulder abduction 160 158    Shoulder horizontal adduction adduction  40 40          Shoulder internal rotation 55 shoulder at 90 degrees abduction 40 Shoulder  at 90 degrees abduction  Shoulder external rotation 100 Shoulder at 90 degrees abduction 90 Shoulder at 90 degrees abduction                                                           (Blank rows = not tested)   UE MMT:   MMT Right 05/22/2021 Left 05/22/2021  Shoulder flexion 5 4  Shoulder extension 5 4  Shoulder abduction 5 4  Shoulder adduction             Shoulder internal rotation 25.6 pounds 26.6 pounds  Shoulder external rotation 20.1 pounds 15.5 pounds  Middle trapezius      Lower trapezius      Elbow flexion      Elbow extension      Wrist flexion      Wrist extension      Wrist ulnar deviation      Wrist radial deviation      Wrist pronation      Wrist supination      Grip strength 43ppsi 40ppsi   (Blank rows = not tested)         FUNCTIONAL TESTS:  5 times sit to stand: 13 seconds no UE support       TODAY'S TREATMENT:   06/01/2021: Access Code: [email or web address] URL: [email or web address] Date: 06/01/2021 Prepared by: Vista Mink  Exercises Standing Shoulder Row with Anchored Resistance - 2-3 x daily - 7 x weekly - 2 sets - 10 reps - 3 seconds hold Supine Cervical Retraction with Towel - 2-3 x daily - 7 x weekly - 10 reps - 5- 10 seconds hold Thoracic Extension Mobilization on Foam Roll - 2-3 x daily - 7 x weekly - 5 reps - 10-20 seconds hold Doorway Pec Stretch at 60 Elevation - 2-3 x daily - 7 x weekly - 3-5 reps - 20 seconds hold Doorway Pec Stretch at 120 Degrees Abduction - 2-3 x daily - 7 x weekly - 3-5 reps - 20 seconds hold Seated Upper Trap Stretch - 2-3 x daily - 7 x weekly - 4-5 reps - 10 seconds hold Standing Shoulder Internal Rotation Stretch with Hands Behind Back - 2-3 x daily - 7 x weekly - 1 sets - 10 reps - 10 seconds hold Standing Scapular Retraction - 5 x daily - 7 x weekly - 1 sets - 5 reps - 5 second hold Shoulder External Rotation with Anchored Resistance - 1 x daily - 7 x weekly - 2 sets - 10 reps - 3  hold  Reviewed body mechanics with cues to set an alarm for every 45-60 minutes to stand up, perform a few of her home exercises (shoulder blade pinches, cervical isometrics, thoracic extension), reviewed sitting posture tips (lumbar roll) and discussed the importance of a 3X/week for 20 minutes home walking program.  Also discussed shoulder anatomy and the mechanism for shoulder impingement.  05/22/2021:  Ther ex:  rows x 10 holding 3 seconds Upper trap stretch x 2 holding 10 seconds Cervical retraction x 2 holding 10 seconds Door stretch x 2 holding 20 seconds arms at 60 degrees x 120 degrees                                          PATIENT EDUCATION:  Education details: See above Person educated: Patient Education method: Explanation Education comprehension: returned demonstration, verbal cues required, and tactile cues required     HOME EXERCISE PROGRAM: Access Code: [email or web address] URL: [email or web address] Date: 05/22/2021 Prepared by: Kearney Hard   Exercises Standing Shoulder Row with Anchored Resistance - 2-3 x daily - 7 x weekly - 2 sets - 10 reps - 3 seconds hold Supine Cervical Retraction with Towel - 2-3 x daily - 7 x weekly - 10 reps - 5- 10 seconds hold Thoracic Extension Mobilization on Foam Roll - 2-3 x daily - 7 x weekly - 5 reps - 10-20 seconds hold Doorway Pec Stretch at 60 Elevation - 2-3 x daily - 7 x weekly - 3-5 reps - 20 seconds hold Doorway Pec Stretch at 120 Degrees Abduction - 2-3 x daily - 7 x weekly - 3-5 reps - 20 seconds hold Seated Upper Trap Stretch - 2-3 x daily - 7 x weekly - 3-5 reps - 10 seconds hold Standing Shoulder Internal Rotation Stretch with Hands Behind Back - 2-3 x daily - 7 x weekly - 1 sets - 10 reps - 10 seconds hold Standing Scapular Retraction - 5 x daily - 7 x weekly - 1 sets - 5 reps - 5 second hold Shoulder External Rotation with Anchored Resistance - 1 x daily - 7 x weekly - 2 sets - 10 reps - 3 hold Home walking  3X/week for 20 minutes     ASSESSMENT:   CLINICAL IMPRESSION: Chanler reports and demonstrates good early HEP compliance.  She will benefit from continued postural strength and awareness work along with posterior capsule (shoulder IR) stretching and shoulder ER strengthening to minimize impingement.     REHAB POTENTIAL: Good   CLINICAL DECISION MAKING: Stable/uncomplicated   EVALUATION COMPLEXITY: Low     GOALS: Goals reviewed with patient? Yes   SHORT TERM GOALS:   STG Name Target Date Goal status  1 Pt will be independent in her initial HEP.    06/12/2021 INITIAL  LONG TERM GOALS:    LTG Name Target Date Goal status  1 Pt will be independent in her advanced HEP and progression.    07/03/2021 INITIAL  2 Pt will be able to improve her bilateral cervical rotation to >/= 65 degrees for improved functional mobility.  07/03/2021 INITIAL  3 Pt will report pain of </= 3/10 after working full shift.    07/03/2021 INITIAL  4 Pt will improve her left UE strength to grossly 5/5 in order to improve functional mobility.      07/03/2021 INITIAL  5 Pt will improve her FOTO score >/= 75%  Baseline: 07/03/2021 INITIAL                      PLAN: PT FREQUENCY: 1x/week   PT DURATION: 6 weeks   PLANNED INTERVENTIONS: Therapeutic exercises, Therapeutic activity, Neuro Muscular re-education, Balance training, Gait training, Patient/Family education, Joint mobilization, Dry Needling, Spinal mobilization, Cryotherapy, Moist heat, Traction, and Manual therapy   PLAN FOR NEXT SESSION: cervical ROM, UBE, levator stretches, cervival mobs, consider DN upper trap/deltoid if appropriate      Farley Ly, PT, MPT 06/01/2021, 4:51 PM

## 2021-06-09 ENCOUNTER — Encounter: Payer: BC Managed Care – PPO | Admitting: Rehabilitative and Restorative Service Providers"

## 2021-06-12 ENCOUNTER — Other Ambulatory Visit: Payer: Self-pay

## 2021-06-12 ENCOUNTER — Encounter: Payer: Self-pay | Admitting: Physical Therapy

## 2021-06-12 ENCOUNTER — Ambulatory Visit: Payer: BC Managed Care – PPO | Admitting: Physical Therapy

## 2021-06-12 DIAGNOSIS — M25512 Pain in left shoulder: Secondary | ICD-10-CM

## 2021-06-12 DIAGNOSIS — M6281 Muscle weakness (generalized): Secondary | ICD-10-CM

## 2021-06-12 DIAGNOSIS — G8929 Other chronic pain: Secondary | ICD-10-CM

## 2021-06-12 DIAGNOSIS — M542 Cervicalgia: Secondary | ICD-10-CM

## 2021-06-12 NOTE — Therapy (Signed)
OUTPATIENT PHYSICAL THERAPY TREATMENT NOTE   Patient Name: Makayla Good MRN: [id number] DOB:01-15-1971, 51 y.o., female Today's Date: 06/12/2021  PCP: [name], DO REFERRING PROVIDER: Lorine Bears, NP   PT End of Session - 06/12/21 1527     Visit Number 3    Number of Visits 7    Date for PT Re-Evaluation 07/03/21    PT Start Time 0277    PT Stop Time 1600    PT Time Calculation (min) 45 min    Activity Tolerance Patient tolerated treatment well;No increased pain    Behavior During Therapy WFL for tasks assessed/performed              Past Medical History:  Diagnosis Date   Asthma    Eczema    Fibroid    H/O oophorectomy    Hypertension    Obesity    Polycystic ovary    Left   Sleep apnea    no CPAP per MD   Past Surgical History:  Procedure Laterality Date   BACK SURGERY     83yrs ago   BREAST BIOPSY Right 2019   fibroadenoma   CESAREAN SECTION  02/01/2005   CESAREAN SECTION  2001   LAPAROSCOPIC GASTRIC SLEEVE RESECTION N/A 07/29/2017   Procedure: LAPAROSCOPIC GASTRIC SLEEVE RESECTION WITH UPPER ENDO ;  Surgeon: [name], MD;  Location: [facility];  Service: General;  Laterality: N/A;   OOPHORECTOMY     Rt.removed   TUBAL LIGATION  02/01/2005   Patient Active Problem List   Diagnosis Date Noted   Multinodular goiter 10/25/2020   Anxiety 10/14/2020   Increased frequency of headaches 09/05/2020   Abnormal CT of the head 09/05/2020   Loud snoring 09/05/2020   Gastroesophageal reflux disease 05/05/2020   Hyperglycemia 05/05/2020   Lower extremity edema 09/22/2019   Lymphedema 09/22/2019   Primary hypertension 08/05/2017   Hypokalemia 08/05/2017   OSA (obstructive sleep apnea) 03/05/2017   Asthma 03/05/2017   Dizziness 02/08/2017   Preventative health care 02/08/2017   Abdominal pain 07/05/2016   RUQ pain 07/05/2016   Irregular periods 07/26/2014   Dysuria 02/04/2014   Severe obesity (BMI >= 40) ([place])  10/21/2012   OA (osteoarthritis) of knee 06/04/2012   Cough 03/05/2012   Plantar fasciitis 09/04/2010   HYPOKALEMIA 07/01/2009   CHEST PAIN UNSPECIFIED 05/18/2009   DEPRESSION 03/07/2009   FATIGUE 03/07/2009   TOBACCO USER 12/03/2008   Morbid obesity ([place]) 05/21/2008   Hyperlipidemia LDL goal <100 41/28/7867   DYSMETABOLIC SYNDROME X 67/20/9470   CONJUNCTIVITIS NOS 02/04/2007   VAGINITIS NOS 11/07/2006   FUNGAL DERMATITIS 10/09/2006   Benign essential HTN 10/09/2006    REFERRING DIAG: M54.2 (ICD-10-CM) - Cervicalgia M79.18 (ICD-10-CM) - Myofascial pain syndrome   THERAPY DIAG:  Cervicalgia  Muscle weakness (generalized)  Chronic left shoulder pain  PERTINENT HISTORY: See above  PRECAUTIONS: Cervical (postural) and shoulder (impingement)  SUBJECTIVE: Kelsi notes good early HEP compliance.  PAIN:  Are you having pain? Yes NPRS scale: 3/10 Pain location: Neck and shoulder Pain orientation: Left  PAIN TYPE: aching Pain description: intermittent  Aggravating factors: Prolonged postures and impingement type postures Relieving factors: Postural correction and avoiding impingement postures     OBJECTIVE:         PATIENT SURVEYS:  05/22/2021 FOTO 72% (predicted 75%)     COGNITION: Overall cognitive status: Within functional limits for tasks assessed            SENSATION:  Light touch: Appears intact     POSTURE:  Forward head and rounded shoulder   PALPATION: TTP: left supraspinatus tendon, left upper trap, anterior deltoid          CERVICAL AROM/PROM   AROM Active (deg) 05/22/2021  Flexion 35   Extension 20          Left lateral flexion 35   Right rotation 55   Left rotation 50    (Blank rows = not tested)   UE AROM/PROM:   Active Right 05/22/2021 Left 05/22/2021  Shoulder flexion 180 170   Shoulder extension 48 45    Shoulder abduction 160 158    Shoulder horizontal adduction adduction  40 40          Shoulder internal rotation 55  shoulder at 90 degrees abduction 40 Shoulder at 90 degrees abduction  Shoulder external rotation 100 Shoulder at 90 degrees abduction 90 Shoulder at 90 degrees abduction                                                           (Blank rows = not tested)   UE MMT:   MMT Right 05/22/2021 Left 05/22/2021  Shoulder flexion 5 4  Shoulder extension 5 4  Shoulder abduction 5 4  Shoulder adduction             Shoulder internal rotation 25.6 pounds 26.6 pounds  Shoulder external rotation 20.1 pounds 15.5 pounds  Middle trapezius      Lower trapezius      Elbow flexion      Elbow extension      Wrist flexion      Wrist extension      Wrist ulnar deviation      Wrist radial deviation      Wrist pronation      Wrist supination      Grip strength 43ppsi 40ppsi   (Blank rows = not tested)         FUNCTIONAL TESTS:  5 times sit to stand: 13 seconds no UE support  Today's Treatment:   06/12/2021:    Therapeutic Exercise:  Aerobic: UBE: L1.5 x 4 minutes ( 2 minutes each direction) Supine: Prone:  Seated: upper trap stretches x 3 holding 10 seconds   Levator stretches x 3 to each side holding 10 seconds  Standing: rows with Level 3 theraband 2 x 10 holding 3 seconds   Shoulder extension: level 3 theraband 2 x10 holding 3 seconds   Wall angels : x 10      Neuromuscular Re-education: Manual Therapy: STM to left upper trap, middle deltoid and infraspinatus with skilled palpation during DN  and active trigger point release Therapeutic Activity:  Self Care: Trigger Point Dry Needling:  DN education handout issued to pt. left upper trap: twitch response noted, left infraspinatus: twitch response noted, left middle deltoid: twitch response  Pt with palpable decrease in muscle tone following DN.  Modalities:       06/01/2021: Access Code: [email or web address] URL: [email or web address] Date: 06/01/2021 Prepared by: Vista Mink  Exercises Standing Shoulder Row with  Anchored Resistance - 2-3 x daily - 7 x weekly - 2 sets - 10 reps - 3 seconds hold Supine Cervical Retraction with Towel - 2-3 x daily - 7 x weekly - 10 reps -  5- 10 seconds hold Thoracic Extension Mobilization on Foam Roll - 2-3 x daily - 7 x weekly - 5 reps - 10-20 seconds hold Doorway Pec Stretch at 60 Elevation - 2-3 x daily - 7 x weekly - 3-5 reps - 20 seconds hold Doorway Pec Stretch at 120 Degrees Abduction - 2-3 x daily - 7 x weekly - 3-5 reps - 20 seconds hold Seated Upper Trap Stretch - 2-3 x daily - 7 x weekly - 4-5 reps - 10 seconds hold Standing Shoulder Internal Rotation Stretch with Hands Behind Back - 2-3 x daily - 7 x weekly - 1 sets - 10 reps - 10 seconds hold Standing Scapular Retraction - 5 x daily - 7 x weekly - 1 sets - 5 reps - 5 second hold Shoulder External Rotation with Anchored Resistance - 1 x daily - 7 x weekly - 2 sets - 10 reps - 3 hold  Reviewed body mechanics with cues to set an alarm for every 45-60 minutes to stand up, perform a few of her home exercises (shoulder blade pinches, cervical isometrics, thoracic extension), reviewed sitting posture tips (lumbar roll) and discussed the importance of a 3X/week for 20 minutes home walking program.  Also discussed shoulder anatomy and the mechanism for shoulder impingement.  05/22/2021:  Ther ex:  rows x 10 holding 3 seconds Upper trap stretch x 2 holding 10 seconds Cervical retraction x 2 holding 10 seconds Door stretch x 2 holding 20 seconds arms at 60 degrees x 120 degrees                                          PATIENT EDUCATION:  Education details: DN and handout issued to pt Person educated: Patient Education method: Explanation Education comprehension: returned demonstration, verbal cues required, and tactile cues required     HOME EXERCISE PROGRAM: Access Code: [email or web address] URL: [email or web address] Date: 05/22/2021 Prepared by: Kearney Hard   Exercises Standing Shoulder Row with  Anchored Resistance - 2-3 x daily - 7 x weekly - 2 sets - 10 reps - 3 seconds hold Supine Cervical Retraction with Towel - 2-3 x daily - 7 x weekly - 10 reps - 5- 10 seconds hold Thoracic Extension Mobilization on Foam Roll - 2-3 x daily - 7 x weekly - 5 reps - 10-20 seconds hold Doorway Pec Stretch at 60 Elevation - 2-3 x daily - 7 x weekly - 3-5 reps - 20 seconds hold Doorway Pec Stretch at 120 Degrees Abduction - 2-3 x daily - 7 x weekly - 3-5 reps - 20 seconds hold Seated Upper Trap Stretch - 2-3 x daily - 7 x weekly - 3-5 reps - 10 seconds hold Standing Shoulder Internal Rotation Stretch with Hands Behind Back - 2-3 x daily - 7 x weekly - 1 sets - 10 reps - 10 seconds hold Standing Scapular Retraction - 5 x daily - 7 x weekly - 1 sets - 5 reps - 5 second hold Shoulder External Rotation with Anchored Resistance - 1 x daily - 7 x weekly - 2 sets - 10 reps - 3 hold Home walking 3X/week for 20 minutes     ASSESSMENT:   CLINICAL IMPRESSION: Pt arriving today still reporting pain in her her left shoulder and left sided neck. We reviewed postural exercises. DN performed with twitch response noted. Pt was edu and issued DN handout and education provided.  Pt tolerating exercises and stretching well. Continue skilled PT to maximize pt's function.     REHAB POTENTIAL: Good   CLINICAL DECISION MAKING: Stable/uncomplicated   EVALUATION COMPLEXITY: Low     GOALS: Goals reviewed with patient? Yes   SHORT TERM GOALS:   STG Name Target Date Goal status  1 Pt will be independent in her initial HEP.    06/12/2021 Ongoing 06/12/2021  LONG TERM GOALS:    LTG Name Target Date Goal status  1 Pt will be independent in her advanced HEP and progression.    07/03/2021 INITIAL  2 Pt will be able to improve her bilateral cervical rotation to >/= 65 degrees for improved functional mobility.  07/03/2021 INITIAL  3 Pt will report pain of </= 3/10 after working full shift.    07/03/2021 INITIAL  4 Pt will  improve her left UE strength to grossly 5/5 in order to improve functional mobility.      07/03/2021 INITIAL  5 Pt will improve her FOTO score >/= 75%    Baseline: 07/03/2021 INITIAL                      PLAN: PT FREQUENCY: 1x/week   PT DURATION: 6 weeks   PLANNED INTERVENTIONS: Therapeutic exercises, Therapeutic activity, Neuro Muscular re-education, Balance training, Gait training, Patient/Family education, Joint mobilization, Dry Needling, Spinal mobilization, Cryotherapy, Moist heat, Traction, and Manual therapy   PLAN FOR NEXT SESSION: cervical ROM, UBE, levator stretches, cervival mobs, assess response to DN.      Oretha Caprice, PT, MPT 06/12/2021, 4:18 PM

## 2021-06-21 ENCOUNTER — Encounter: Payer: BC Managed Care – PPO | Admitting: Physical Therapy

## 2021-06-26 ENCOUNTER — Encounter: Payer: BC Managed Care – PPO | Admitting: Physical Therapy

## 2021-06-28 ENCOUNTER — Other Ambulatory Visit: Payer: Self-pay | Admitting: Family Medicine

## 2021-06-28 DIAGNOSIS — I10 Essential (primary) hypertension: Secondary | ICD-10-CM

## 2021-06-28 DIAGNOSIS — R600 Localized edema: Secondary | ICD-10-CM

## 2021-06-29 ENCOUNTER — Telehealth: Payer: Self-pay | Admitting: Family Medicine

## 2021-06-29 NOTE — Telephone Encounter (Signed)
Pt would like to know if it's time for her to have her second shingle shot  ? ?Please advise  ?

## 2021-06-30 NOTE — Telephone Encounter (Signed)
It is. Pt just needs a nurse visit please ?

## 2021-07-03 ENCOUNTER — Encounter: Payer: Self-pay | Admitting: Physical Therapy

## 2021-07-03 ENCOUNTER — Other Ambulatory Visit: Payer: Self-pay

## 2021-07-03 ENCOUNTER — Ambulatory Visit: Payer: BC Managed Care – PPO | Admitting: Physical Therapy

## 2021-07-03 DIAGNOSIS — G8929 Other chronic pain: Secondary | ICD-10-CM | POA: Diagnosis not present

## 2021-07-03 DIAGNOSIS — M6281 Muscle weakness (generalized): Secondary | ICD-10-CM

## 2021-07-03 DIAGNOSIS — M542 Cervicalgia: Secondary | ICD-10-CM | POA: Diagnosis not present

## 2021-07-03 DIAGNOSIS — M25512 Pain in left shoulder: Secondary | ICD-10-CM

## 2021-07-03 NOTE — Therapy (Signed)
OUTPATIENT PHYSICAL THERAPY TREATMENT NOTE   Patient Name: Makayla Good MRN: [id number] DOB:1971-02-11, 51 y.o., female Today's Date: 07/03/2021  PCP: [name], DO REFERRING PROVIDER: Lorine Bears, NP   PT End of Session - 07/03/21 1511     Visit Number 4    Number of Visits 8    Date for PT Re-Evaluation 07/03/21    PT Start Time 1430    PT Stop Time 1510    PT Time Calculation (min) 40 min    Activity Tolerance Patient tolerated treatment well;No increased pain    Behavior During Therapy WFL for tasks assessed/performed               Past Medical History:  Diagnosis Date   Asthma    Eczema    Fibroid    H/O oophorectomy    Hypertension    Obesity    Polycystic ovary    Left   Sleep apnea    no CPAP per MD   Past Surgical History:  Procedure Laterality Date   BACK SURGERY     44yr ago   BREAST BIOPSY Right 2019   fibroadenoma   CESAREAN SECTION  02/01/2005   CESAREAN SECTION  2001   LAPAROSCOPIC GASTRIC SLEEVE RESECTION N/A 07/29/2017   Procedure: LAPAROSCOPIC GASTRIC SLEEVE RESECTION WITH UPPER ENDO ;  Surgeon: CClovis Riley MD;  Location: [facility];  Service: General;  Laterality: N/A;   OOPHORECTOMY     Rt.removed   TUBAL LIGATION  02/01/2005   Patient Active Problem List   Diagnosis Date Noted   Multinodular goiter 10/25/2020   Anxiety 10/14/2020   Increased frequency of headaches 09/05/2020   Abnormal CT of the head 09/05/2020   Loud snoring 09/05/2020   Gastroesophageal reflux disease 05/05/2020   Hyperglycemia 05/05/2020   Lower extremity edema 09/22/2019   Lymphedema 09/22/2019   Primary hypertension 08/05/2017   Hypokalemia 08/05/2017   OSA (obstructive sleep apnea) 03/05/2017   Asthma 03/05/2017   Dizziness 02/08/2017   Preventative health care 02/08/2017   Abdominal pain 07/05/2016   RUQ pain 07/05/2016   Irregular periods 07/26/2014   Dysuria 02/04/2014   Severe obesity (BMI >= 40) ([facility]  10/21/2012   OA (osteoarthritis) of knee 06/04/2012   Cough 03/05/2012   Plantar fasciitis 09/04/2010   HYPOKALEMIA 07/01/2009   CHEST PAIN UNSPECIFIED 05/18/2009   DEPRESSION 03/07/2009   FATIGUE 03/07/2009   TOBACCO USER 12/03/2008   Morbid obesity [place] 05/21/2008   Hyperlipidemia LDL goal <100 067/61/9509  DYSMETABOLIC SYNDROME X 032/67/1245  CONJUNCTIVITIS NOS 02/04/2007   VAGINITIS NOS 11/07/2006   FUNGAL DERMATITIS 10/09/2006   Benign essential HTN 10/09/2006    REFERRING DIAG: M54.2 (ICD-10-CM) - Cervicalgia M79.18 (ICD-10-CM) - Myofascial pain syndrome   THERAPY DIAG:  Cervicalgia  Muscle weakness (generalized)  Chronic left shoulder pain  PERTINENT HISTORY: See above  PRECAUTIONS: Cervical (postural) and shoulder (impingement)  SUBJECTIVE: [place] notes good early HEP compliance.  PAIN:  Are you having pain? Yes NPRS scale: 3/10 Pain location: Neck and shoulder Pain orientation: Left  PAIN TYPE: aching Pain description: intermittent  Aggravating factors: Prolonged postures and impingement type postures Relieving factors: Postural correction and avoiding impingement postures     OBJECTIVE:         PATIENT SURVEYS:  05/22/2021 FOTO 72% (predicted 75%)     COGNITION: Overall cognitive status: Within functional limits for tasks assessed  SENSATION: Light touch: Appears intact     POSTURE:  Forward head and rounded shoulder   PALPATION: TTP: left supraspinatus tendon, left upper trap, anterior deltoid          CERVICAL AROM/PROM   AROM Active (deg) 05/22/2021 AROM 07/03/2021  Flexion 35  40  Extension 20    25  Right Lateral flexion 35  34  Left lateral flexion 35  38  Right rotation 55  60  Left rotation 50  55    (Blank rows = not tested)   UE AROM/PROM:   Active Right 05/22/2021 Left 05/22/2021  Shoulder flexion 180 170   Shoulder extension 48 45    Shoulder abduction 160 158    Shoulder horizontal adduction  adduction  40 40          Shoulder internal rotation 55 shoulder at 90 degrees abduction 40 Shoulder at 90 degrees abduction  Shoulder external rotation 100 Shoulder at 90 degrees abduction 90 Shoulder at 90 degrees abduction                                                           (Blank rows = not tested)   UE MMT:   MMT Right 05/22/2021 Left 05/22/2021  Shoulder flexion 5 4  Shoulder extension 5 4  Shoulder abduction 5 4  Shoulder adduction             Shoulder internal rotation 25.6 pounds 26.6 pounds  Shoulder external rotation 20.1 pounds 15.5 pounds  Middle trapezius      Lower trapezius      Elbow flexion      Elbow extension      Wrist flexion      Wrist extension      Wrist ulnar deviation      Wrist radial deviation      Wrist pronation      Wrist supination      Grip strength 43ppsi 40ppsi   (Blank rows = not tested)         FUNCTIONAL TESTS:  5 times sit to stand: 13 seconds no UE support  Today's Treatment:   07/03/2021:    Therapeutic Exercise:  Aerobic: UBE: L 2.0 x 4 minutes ( 2 minutes each direction) Supine: Prone: "I's": shoulder flexion x 10 holding 3 seconds              "W's" x 10 holding 3 seconds    "T's" x 10 holding 3 seconds  Seated: upper trap stretches x 3 holding 10 seconds   Levator stretches x 3 to each side holding 10 seconds  Standing: rows with Level 3 theraband 2 x 15 holding 3 seconds   Shoulder extension: level 3 theraband 2 x10 holding 3 seconds   Trunk extension over red physio ball with shoulder flexion x 5 and shoulder abduction x 5 holding 5 seconds   Wall angels : x 10    Money: bilateral shoulder ER using Level 2 Theraband 2 x 15   Door Stretches: 3 positions (120, 90 and 60 degrees) x 1 holding 30 seconds each     Neuromuscular Re-education: Manual Therapy:  IASTM to left shoulder and bilateral Upper traps x 8 minutes Therapeutic Activity:   Self Care: Trigger Point Dry Needling:   Modalities:  06/12/2021:    Therapeutic Exercise:  Aerobic: UBE: L1.5 x 4 minutes ( 2 minutes each direction) Supine: Prone:  Seated: upper trap stretches x 3 holding 10 seconds   Levator stretches x 3 to each side holding 10 seconds  Standing: rows with Level 3 theraband 2 x 10 holding 3 seconds   Shoulder extension: level 3 theraband 2 x10 holding 3 seconds   Wall angels : x 10 holding 5 seconds at top stretch     Neuromuscular Re-education: Manual Therapy: STM to left upper trap, middle deltoid and infraspinatus with skilled palpation during DN  and active trigger point release Therapeutic Activity:  Self Care: Trigger Point Dry Needling:  DN education handout issued to pt. left upper trap: twitch response noted, left infraspinatus: twitch response noted, left middle deltoid: twitch response  Pt with palpable decrease in muscle tone following DN.  Modalities:                                                PATIENT EDUCATION:  Education details: DN and handout issued to pt Person educated: Patient Education method: Explanation Education comprehension: returned demonstration, verbal cues required, and tactile cues required     HOME EXERCISE PROGRAM: Access Code: [email or web address] URL: [email or web address] Date: 05/22/2021 Prepared by: Kearney Hard   Exercises Standing Shoulder Row with Anchored Resistance - 2-3 x daily - 7 x weekly - 2 sets - 10 reps - 3 seconds hold Supine Cervical Retraction with Towel - 2-3 x daily - 7 x weekly - 10 reps - 5- 10 seconds hold Thoracic Extension Mobilization on Foam Roll - 2-3 x daily - 7 x weekly - 5 reps - 10-20 seconds hold Doorway Pec Stretch at 60 Elevation - 2-3 x daily - 7 x weekly - 3-5 reps - 20 seconds hold Doorway Pec Stretch at 120 Degrees Abduction - 2-3 x daily - 7 x weekly - 3-5 reps - 20 seconds hold Seated Upper Trap Stretch - 2-3 x daily - 7 x weekly - 3-5 reps - 10 seconds hold Standing Shoulder Internal Rotation  Stretch with Hands Behind Back - 2-3 x daily - 7 x weekly - 1 sets - 10 reps - 10 seconds hold Standing Scapular Retraction - 5 x daily - 7 x weekly - 1 sets - 5 reps - 5 second hold Shoulder External Rotation with Anchored Resistance - 1 x daily - 7 x weekly - 2 sets - 10 reps - 3 hold Home walking 3X/week for 20 minutes     ASSESSMENT:   CLINICAL IMPRESSION: Pt arriving today still reporting pain in her her left shoulder and left sided neck. Pt stating pain is 4/10 but can increase to 6/10. Pt reporting good response to DN at her last visit. Pt has attended 4 PT visits with progress made. I am requesting 4 additional visits 1x/ week to progress toward all LTG's not met.    REHAB POTENTIAL: Good   CLINICAL DECISION MAKING: Stable/uncomplicated   EVALUATION COMPLEXITY: Low     GOALS: Goals reviewed with patient? Yes   SHORT TERM GOALS:   STG Name Target Date Goal status  1 Pt will be independent in her initial HEP.    06/12/2021 Ongoing 06/12/2021  LONG TERM GOALS:    LTG Name Target Date Goal status  1 Pt will be independent in her advanced  HEP and progression.    08/03/2021 On-going 07/03/2021  2 Pt will be able to improve her bilateral cervical rotation to >/= 65 degrees for improved functional mobility.  08/03/2021 On-going 07/03/2021  3 Pt will report pain of </= 3/10 after working full shift.    08/03/2021 On-going 07/03/2021  4 Pt will improve her left UE strength to grossly 5/5 in order to improve functional mobility.      413/2023 On-going 07/03/2021  5 Pt will improve her FOTO score >/= 75%    Baseline: 08/03/2021 INITIAL                      PLAN: PT FREQUENCY: 1x/week   PT DURATION: 6 weeks   PLANNED INTERVENTIONS: Therapeutic exercises, Therapeutic activity, Neuro Muscular re-education, Balance training, Gait training, Patient/Family education, Joint mobilization, Dry Needling, Spinal mobilization, Cryotherapy, Moist heat, Traction, and Manual therapy   PLAN  FOR NEXT SESSION: cervical ROM, shoulder stretches, UBE, levator stretches, cervival mobs, DN is needed.       Oretha Caprice, PT, MPT 07/03/2021, 3:17 PM

## 2021-07-04 ENCOUNTER — Ambulatory Visit (INDEPENDENT_AMBULATORY_CARE_PROVIDER_SITE_OTHER): Payer: BC Managed Care – PPO

## 2021-07-04 DIAGNOSIS — Z23 Encounter for immunization: Secondary | ICD-10-CM

## 2021-07-04 NOTE — Progress Notes (Signed)
Patients here for shingles vaccine per Dr. Etter Sjogren ,given IM right deltoid patients tolerated injection well.  ?

## 2021-07-04 NOTE — Progress Notes (Deleted)
.  nurse

## 2021-07-05 ENCOUNTER — Ambulatory Visit: Payer: BC Managed Care – PPO

## 2021-07-10 ENCOUNTER — Encounter: Payer: BC Managed Care – PPO | Admitting: Physical Therapy

## 2021-07-17 ENCOUNTER — Other Ambulatory Visit: Payer: Self-pay

## 2021-07-17 ENCOUNTER — Ambulatory Visit: Payer: BC Managed Care – PPO | Admitting: Physical Therapy

## 2021-07-17 ENCOUNTER — Encounter: Payer: Self-pay | Admitting: Physical Therapy

## 2021-07-17 DIAGNOSIS — G8929 Other chronic pain: Secondary | ICD-10-CM

## 2021-07-17 DIAGNOSIS — M25512 Pain in left shoulder: Secondary | ICD-10-CM | POA: Diagnosis not present

## 2021-07-17 DIAGNOSIS — M542 Cervicalgia: Secondary | ICD-10-CM

## 2021-07-17 DIAGNOSIS — M6281 Muscle weakness (generalized): Secondary | ICD-10-CM

## 2021-07-17 NOTE — Therapy (Addendum)
OUTPATIENT PHYSICAL THERAPY TREATMENT NOTE Discharge   Patient Name: Makayla Good MRN: [id number] DOB:10-26-1970, 51 y.o., female Today's Date: 07/17/2021  PCP: [name], DO REFERRING PROVIDER: Lorine Bears, NP   PT End of Session - 07/17/21 1510     Visit Number 5    Number of Visits 8    Date for PT Re-Evaluation 07/03/21    PT Start Time 6387    PT Stop Time 1515    PT Time Calculation (min) 39 min    Activity Tolerance Patient tolerated treatment well;No increased pain    Behavior During Therapy WFL for tasks assessed/performed                Past Medical History:  Diagnosis Date   Asthma    Eczema    Fibroid    H/O oophorectomy    Hypertension    Obesity    Polycystic ovary    Left   Sleep apnea    no CPAP per MD   Past Surgical History:  Procedure Laterality Date   BACK SURGERY     34yr ago   BREAST BIOPSY Right 2019   fibroadenoma   CESAREAN SECTION  02/01/2005   CESAREAN SECTION  2001   LAPAROSCOPIC GASTRIC SLEEVE RESECTION N/A 07/29/2017   Procedure: LAPAROSCOPIC GASTRIC SLEEVE RESECTION WITH UPPER ENDO ;  Surgeon: CClovis Riley MD;  Location: [facility];  Service: General;  Laterality: N/A;   OOPHORECTOMY     Rt.removed   TUBAL LIGATION  02/01/2005   Patient Active Problem List   Diagnosis Date Noted   Multinodular goiter 10/25/2020   Anxiety 10/14/2020   Increased frequency of headaches 09/05/2020   Abnormal CT of the head 09/05/2020   Loud snoring 09/05/2020   Gastroesophageal reflux disease 05/05/2020   Hyperglycemia 05/05/2020   Lower extremity edema 09/22/2019   Lymphedema 09/22/2019   Primary hypertension 08/05/2017   Hypokalemia 08/05/2017   OSA (obstructive sleep apnea) 03/05/2017   Asthma 03/05/2017   Dizziness 02/08/2017   Preventative health care 02/08/2017   Abdominal pain 07/05/2016   RUQ pain 07/05/2016   Irregular periods 07/26/2014   Dysuria 02/04/2014   Severe obesity (BMI >= 40)  ([facility]) 10/21/2012   OA (osteoarthritis) of knee 06/04/2012   Cough 03/05/2012   Plantar fasciitis 09/04/2010   HYPOKALEMIA 07/01/2009   CHEST PAIN UNSPECIFIED 05/18/2009   DEPRESSION 03/07/2009   FATIGUE 03/07/2009   TOBACCO USER 12/03/2008   Morbid obesity ([facility] 05/21/2008   Hyperlipidemia LDL goal <100 056/43/3295  DYSMETABOLIC SYNDROME X 018/84/1660  CONJUNCTIVITIS NOS 02/04/2007   VAGINITIS NOS 11/07/2006   FUNGAL DERMATITIS 10/09/2006   Benign essential HTN 10/09/2006    REFERRING DIAG: M54.2 (ICD-10-CM) - Cervicalgia M79.18 (ICD-10-CM) - Myofascial pain syndrome   THERAPY DIAG:  Cervicalgia  Muscle weakness (generalized)  Chronic left shoulder pain  PERTINENT HISTORY: See above  PRECAUTIONS: Cervical (postural) and shoulder (impingement)  SUBJECTIVE: Pt reporting compliance and more consistence in her HEP. Pt reporting 2/10 neck pain.   PAIN:  Are you having pain? Yes NPRS scale: 2/10 Pain location: Neck  Pain orientation: Left  PAIN TYPE: aching Pain description: intermittent  Aggravating factors: Prolonged postures and impingement type postures Relieving factors: Postural correction and avoiding impingement postures     OBJECTIVE:   PATIENT SURVEYS:  05/22/2021 FOTO 72% (predicted 75%)     COGNITION: Overall cognitive status: Within functional limits for tasks assessed  SENSATION: Light touch: Appears intact     POSTURE:  Forward head and rounded shoulder   PALPATION: TTP: left supraspinatus tendon, left upper trap, anterior deltoid          CERVICAL AROM/PROM   AROM Active (deg) 05/22/2021 AROM 07/03/2021 AROM 07/17/2021  Flexion 35  40 40  Extension '20    25 28  ' Right Lateral flexion 35  34 35  Left lateral flexion 35  38 35  Right rotation 55  60 62  Left rotation 50  55  55   (Blank rows = not tested)   UE AROM/PROM:   Active Right 05/22/2021 Left 05/22/2021  Shoulder flexion 180 170   Shoulder extension 48 45     Shoulder abduction 160 158    Shoulder horizontal adduction adduction  40 40          Shoulder internal rotation 55 shoulder at 90 degrees abduction 40 Shoulder at 90 degrees abduction  Shoulder external rotation 100 Shoulder at 90 degrees abduction 90 Shoulder at 90 degrees abduction                                                           (Blank rows = not tested)   UE MMT:   MMT Right 05/22/2021 Left 05/22/2021  Shoulder flexion 5 4  Shoulder extension 5 4  Shoulder abduction 5 4  Shoulder adduction             Shoulder internal rotation 25.6 pounds 26.6 pounds  Shoulder external rotation 20.1 pounds 15.5 pounds  Middle trapezius      Lower trapezius      Elbow flexion      Elbow extension      Wrist flexion      Wrist extension      Wrist ulnar deviation      Wrist radial deviation      Wrist pronation      Wrist supination      Grip strength 43ppsi 40ppsi   (Blank rows = not tested)         FUNCTIONAL TESTS:  5 times sit to stand: 13 seconds no UE support  Today's Treatment:   07/17/2021:  Therapeutic Exercise:  Aerobic: UBE: L 2.5 x 4 minutes ( 2 minutes each direction) Supine: cervical retraction x 10 holding 5 seconds    PNF D1 with head turns x 15 each UE using 1# weight Prone: "I's": shoulder flexion x 10 holding 3 seconds              "W's" x 10 holding 3 seconds    "T's" x 10 holding 3 seconds Quadraped: cat/camel x 10 holding 10 seconds each position  Seated: upper trap stretches x 3 holding 10 seconds   Levator stretches x 3 to each side holding 10 seconds  Standing: rows with Level 3 theraband 2 x 15 holding 3 seconds   Shoulder extension: level 3 theraband 2 x10 holding 3 seconds             BATCA: rows 15# 3 x 10      07/03/2021:    Therapeutic Exercise:  Aerobic: UBE: L 2.0 x 4 minutes ( 2 minutes each direction) Supine: Prone: "I's": shoulder flexion x 10 holding 3 seconds              "  W's" x 10 holding 3 seconds    "T's" x  10 holding 3 seconds  Seated: upper trap stretches x 3 holding 10 seconds   Levator stretches x 3 to each side holding 10 seconds  Standing: rows with Level 3 theraband 2 x 15 holding 3 seconds   Shoulder extension: level 3 theraband 2 x10 holding 3 seconds   Trunk extension over red physio ball with shoulder flexion x 5 and shoulder abduction x 5 holding 5 seconds   Wall angels : x 10    Money: bilateral shoulder ER using Level 2 Theraband 2 x 15   Door Stretches: 3 positions (120, 90 and 60 degrees) x 1 holding 30 seconds each     Neuromuscular Re-education: Manual Therapy:  IASTM to left shoulder and bilateral Upper traps x 8 minutes      06/12/2021:    Therapeutic Exercise:  Aerobic: UBE: L1.5 x 4 minutes ( 2 minutes each direction) Supine: Prone:  Seated: upper trap stretches x 3 holding 10 seconds   Levator stretches x 3 to each side holding 10 seconds  Standing: rows with Level 3 theraband 2 x 10 holding 3 seconds   Shoulder extension: level 3 theraband 2 x10 holding 3 seconds   Wall angels : x 10 holding 5 seconds at top stretch     Neuromuscular Re-education: Manual Therapy: STM to left upper trap, middle deltoid and infraspinatus with skilled palpation during DN  and active trigger point release Therapeutic Activity:  Self Care: Trigger Point Dry Needling:  DN education handout issued to pt. left upper trap: twitch response noted, left infraspinatus: twitch response noted, left middle deltoid: twitch response  Pt with palpable decrease in muscle tone following DN.  Modalities:                                                PATIENT EDUCATION:  Education details: DN and handout issued to pt Person educated: Patient Education method: Explanation Education comprehension: returned demonstration, verbal cues required, and tactile cues required     HOME EXERCISE PROGRAM: Access Code: [email or web address] URL: [email or web address] Date: 05/22/2021 Prepared  by: Kearney Hard   Exercises Standing Shoulder Row with Anchored Resistance - 2-3 x daily - 7 x weekly - 2 sets - 10 reps - 3 seconds hold Supine Cervical Retraction with Towel - 2-3 x daily - 7 x weekly - 10 reps - 5- 10 seconds hold Thoracic Extension Mobilization on Foam Roll - 2-3 x daily - 7 x weekly - 5 reps - 10-20 seconds hold Doorway Pec Stretch at 60 Elevation - 2-3 x daily - 7 x weekly - 3-5 reps - 20 seconds hold Doorway Pec Stretch at 120 Degrees Abduction - 2-3 x daily - 7 x weekly - 3-5 reps - 20 seconds hold Seated Upper Trap Stretch - 2-3 x daily - 7 x weekly - 3-5 reps - 10 seconds hold Standing Shoulder Internal Rotation Stretch with Hands Behind Back - 2-3 x daily - 7 x weekly - 1 sets - 10 reps - 10 seconds hold Standing Scapular Retraction - 5 x daily - 7 x weekly - 1 sets - 5 reps - 5 second hold Shoulder External Rotation with Anchored Resistance - 1 x daily - 7 x weekly - 2 sets - 10 reps - 3 hold Home walking 3X/week for 20  minutes     ASSESSMENT:   CLINICAL IMPRESSION:  07/17/2021: Pt arriving today reporting 3/10 pain in her neck. Pt reporting consistency in her HEP has made a difference. Pt tolerating exercises well focusing on AROM, strengthening and improved posture. Continue skilled PT progressing toward LTG's set.         REHAB POTENTIAL: Good   CLINICAL DECISION MAKING: Stable/uncomplicated   EVALUATION COMPLEXITY: Low     GOALS: Goals reviewed with patient? Yes   SHORT TERM GOALS:   STG Name Target Date Goal status  1 Pt will be independent in her initial HEP.    06/12/2021 MET 07/17/2021  LONG TERM GOALS:    LTG Name Target Date Goal status  1 Pt will be independent in her advanced HEP and progression.    08/03/2021 On-going 07/03/2021  2 Pt will be able to improve her bilateral cervical rotation to >/= 65 degrees for improved functional mobility.  08/03/2021 On-going 07/17/2021  3 Pt will report pain of </= 3/10 after working full  shift.    08/03/2021 MET 07/17/2021  4 Pt will improve her left UE strength to grossly 5/5 in order to improve functional mobility.      413/2023 On-going 07/17/2021  5 Pt will improve her FOTO score >/= 75%    Baseline: 08/03/2021 INITIAL                      PLAN: PT FREQUENCY: 1x/week   PT DURATION: 6 weeks   PLANNED INTERVENTIONS: Therapeutic exercises, Therapeutic activity, Neuro Muscular re-education, Balance training, Gait training, Patient/Family education, Joint mobilization, Dry Needling, Spinal mobilization, Cryotherapy, Moist heat, Traction, and Manual therapy   PLAN FOR NEXT SESSION: continue with cervical ROM, shoulder stretches, UBE, levator stretches, cervival mobs, DN is needed.       Oretha Caprice, PT, MPT  07/17/2021, 3:12 PM  PHYSICAL THERAPY DISCHARGE SUMMARY  Visits from Start of Care: 5/8  Current functional level related to goals / functional outcomes: See above   Remaining deficits: See above   Education / Equipment: HEP   Patient agrees to discharge. Patient goals were partially met. Patient is being discharged due to not returning since the last visit.

## 2021-07-26 DIAGNOSIS — K912 Postsurgical malabsorption, not elsewhere classified: Secondary | ICD-10-CM | POA: Diagnosis not present

## 2021-07-26 DIAGNOSIS — Z09 Encounter for follow-up examination after completed treatment for conditions other than malignant neoplasm: Secondary | ICD-10-CM | POA: Diagnosis not present

## 2021-08-28 ENCOUNTER — Ambulatory Visit: Payer: BC Managed Care – PPO | Admitting: Skilled Nursing Facility1

## 2021-09-12 NOTE — Progress Notes (Unsigned)
51 y.o. [id number] Married [place] American female here for annual exam.  Complains of pelvic pain off and on for 2 months.  LMP: 04/20022. Feels she I managing well so far.   Some anxiety and is doing more exercise now.   Feels like she is almost ready to start a menstruation.  No bleeding for over one year.  No constipation.  No dysuria.   Some night time hot flashes.   Mother passed this month.   PCP: [name] Chase, DO  No LMP recorded. Patient is postmenopausal.           Sexually active: No.  The current method of family planning is tubal ligation.    Exercising: Yes.    Home exercise routine includes walking 1 hrs per days. Smoker:  former  Health Maintenance: Pap:   05-22-18 Neg:Neg HR HPV, 01-23-16 Neg:Neg HR HPV, 10-21-12 Neg:Neg HR HPV History of abnormal Pap:  no MMG:  12-07-20 Neg/Birads1 Colonoscopy:  04-08-19 1 benign polyp;next 10 yrs. BMD:     Result   TDaP:  PCP Gardasil:   no HIV: 06-13-17 NR Hep C: 10-11-20 NR Screening Labs:  PCP   reports that she has been smoking cigarettes and e-cigarettes. She started smoking about 16 months ago. She has a 15.00 pack-year smoking history. She has never used smokeless tobacco. She reports current alcohol use. She reports that she does not use drugs.  Past Medical History:  Diagnosis Date   Asthma    Eczema    Fibroid    H/O oophorectomy    Hypertension    Obesity    Polycystic ovary    Left   Sleep apnea    no CPAP per MD    Past Surgical History:  Procedure Laterality Date   BACK SURGERY     38yr ago   BREAST BIOPSY Right 2019   fibroadenoma   CESAREAN SECTION  02/01/2005   CESAREAN SECTION  2001   LAPAROSCOPIC GASTRIC SLEEVE RESECTION N/A 07/29/2017   Procedure: LAPAROSCOPIC GASTRIC SLEEVE RESECTION WITH UPPER ENDO ;  Surgeon: CClovis Riley MD;  Location: [facility];  Service: General;  Laterality: N/A;   OOPHORECTOMY     Rt.removed   TUBAL LIGATION  02/01/2005    Current Outpatient Medications   Medication Sig Dispense Refill   amLODipine (NORVASC) 5 MG tablet Take 1 tablet (5 mg total) by mouth daily. 90 tablet 1   ASHWAGANDHA PO Take 1 tablet by mouth daily.     Biotin w/ Vitamins C & E (HAIR/SKIN/NAILS PO) Take 1 tablet by mouth daily.     CALCIUM PO Take by mouth.     fluticasone (FLONASE) 50 MCG/ACT nasal spray SPRAY 2 SPRAYS INTO EACH NOSTRIL EVERY DAY 48 mL 2   hydrochlorothiazide (HYDRODIURIL) 25 MG tablet TAKE 1 TABLET BY MOUTH EVERY DAY 90 tablet 1   meclizine (ANTIVERT) 12.5 MG tablet Take 1 tablet (12.5 mg total) by mouth 3 (three) times daily as needed for dizziness. 30 tablet 0   Multiple Vitamins-Minerals (CELEBRATE MULTI-COMPLETE 36 PO) Take 1 each by mouth daily.     potassium chloride (KLOR-CON) 10 MEQ tablet Take 1 tablet (10 mEq total) by mouth daily. 90 tablet 1   ALPRAZolam (XANAX) 0.25 MG tablet Take 1 tablet (0.25 mg total) by mouth 3 (three) times daily as needed for anxiety. (Patient not taking: Reported on 09/14/2021) 30 tablet 0   busPIRone (BUSPAR) 7.5 MG tablet TAKE 1 TABLET BY MOUTH 2 TIMES DAILY. (Patient  not taking: Reported on 09/14/2021) 180 tablet 0   nicotine (NICODERM CQ - DOSED IN MG/24 HOURS) 14 mg/24hr patch Place 1 patch (14 mg total) onto the skin daily. (Patient not taking: Reported on 09/14/2021) 28 patch 1   No current facility-administered medications for this visit.    Family History  Problem Relation Age of Onset   Hypertension Mother    Schizophrenia Mother    Dementia Mother    Mental illness Mother        schizophrenia, dementia   Hypertension Father    Coronary artery disease Father        Stent   Alzheimer's disease Father    Hypertension Sister    Hypertension Sister    Arthritis Sister        rheumatoid   Dementia Maternal Aunt    Kidney disease Other    Colon cancer Neg Hx    Stomach cancer Neg Hx    Pancreatic cancer Neg Hx    Esophageal cancer Neg Hx    Rectal cancer Neg Hx     Review of Systems   Genitourinary:  Positive for pelvic pain.  All other systems reviewed and are negative.  Exam:   BP 124/80 (BP Location: Right Arm, Patient Position: Sitting, Cuff Size: Large)   Ht 5' 5.25" (1.657 m)   Wt 202 lb (91.6 kg)   BMI 33.36 kg/m     General appearance: alert, cooperative and appears stated age Head: normocephalic, without obvious abnormality, atraumatic Neck: no adenopathy, supple, symmetrical, trachea midline and thyroid normal to inspection and palpation Lungs: clear to auscultation bilaterally Breasts: normal appearance, no masses or tenderness, No nipple retraction or dimpling, No nipple discharge or bleeding, No axillary adenopathy Heart: regular rate and rhythm Abdomen: soft, non-tender; no masses, no organomegaly Extremities: extremities normal, atraumatic, no cyanosis or edema Skin: skin color, texture, turgor normal. No rashes or lesions Lymph nodes: cervical, supraclavicular, and axillary nodes normal. Neurologic: grossly normal  Pelvic: External genitalia:  no lesions              No abnormal inguinal nodes palpated.              Urethra:  normal appearing urethra with no masses, tenderness or lesions              Bartholins and Skenes: normal                 Vagina: normal appearing vagina with normal color and discharge, no lesions              Cervix: no lesions              Pap taken: no Bimanual Exam:  Uterus:  normal size, contour, position, consistency, mobility, non-tender              Adnexa: no mass, fullness, tenderness              Rectal exam: yes.  Confirms.              Anus:  normal sphincter tone, no lesions  Chaperone was present for exam:  Santiago Glad, CMA  Assessment:   Well woman visit with gynecologic exam. Postmenopausal female by LMP.  Status post right oophorectomy.  Status post gastric sleeve surgery.  Status post BTL.   Plan: Mammogram screening discussed. Self breast awareness reviewed. Pap and HR HPV 2025 Guidelines for  Calcium, Vitamin D, regular exercise program including cardiovascular and weight bearing exercise. Will  check FSH and estradiol.  Will order pelvic ultrasound if patient has persistent pelvic pain.    Follow up annually and prn.   After visit summary provided.

## 2021-09-14 ENCOUNTER — Ambulatory Visit (INDEPENDENT_AMBULATORY_CARE_PROVIDER_SITE_OTHER): Payer: BC Managed Care – PPO | Admitting: Obstetrics and Gynecology

## 2021-09-14 ENCOUNTER — Encounter: Payer: Self-pay | Admitting: Obstetrics and Gynecology

## 2021-09-14 VITALS — BP 124/80 | Ht 65.25 in | Wt 202.0 lb

## 2021-09-14 DIAGNOSIS — I10 Essential (primary) hypertension: Secondary | ICD-10-CM | POA: Diagnosis not present

## 2021-09-14 DIAGNOSIS — N926 Irregular menstruation, unspecified: Secondary | ICD-10-CM | POA: Diagnosis not present

## 2021-09-14 DIAGNOSIS — E785 Hyperlipidemia, unspecified: Secondary | ICD-10-CM | POA: Diagnosis not present

## 2021-09-14 DIAGNOSIS — N951 Menopausal and female climacteric states: Secondary | ICD-10-CM | POA: Diagnosis not present

## 2021-09-14 DIAGNOSIS — Z01419 Encounter for gynecological examination (general) (routine) without abnormal findings: Secondary | ICD-10-CM

## 2021-09-14 NOTE — Patient Instructions (Signed)
EXERCISE AND DIET:  We recommended that you start or continue a regular exercise program for good health. Regular exercise means any activity that makes your heart beat faster and makes you sweat.  We recommend exercising at least 30 minutes per day at least 3 days a week, preferably 4 or 5.  We also recommend a diet low in fat and sugar.  Inactivity, poor dietary choices and obesity can cause diabetes, heart attack, stroke, and kidney damage, among others.    ALCOHOL AND SMOKING:  Women should limit their alcohol intake to no more than 7 drinks/beers/glasses of wine (combined, not each!) per week. Moderation of alcohol intake to this level decreases your risk of breast cancer and liver damage. And of course, no recreational drugs are part of a healthy lifestyle.  And absolutely no smoking or even second hand smoke. Most people know smoking can cause heart and lung diseases, but did you know it also contributes to weakening of your bones? Aging of your skin?  Yellowing of your teeth and nails?  CALCIUM AND VITAMIN D:  Adequate intake of calcium and Vitamin D are recommended.  The recommendations for exact amounts of these supplements seem to change often, but generally speaking 600 mg of calcium (either carbonate or citrate) and 800 units of Vitamin D per day seems prudent. Certain women may benefit from higher intake of Vitamin D.  If you are among these women, your doctor will have told you during your visit.    PAP SMEARS:  Pap smears, to check for cervical cancer or precancers,  have traditionally been done yearly, although recent scientific advances have shown that most women can have pap smears less often.  However, every woman still should have a physical exam from her gynecologist every year. It will include a breast check, inspection of the vulva and vagina to check for abnormal growths or skin changes, a visual exam of the cervix, and then an exam to evaluate the size and shape of the uterus and  ovaries.  And after 51 years of age, a rectal exam is indicated to check for rectal cancers. We will also provide age appropriate advice regarding health maintenance, like when you should have certain vaccines, screening for sexually transmitted diseases, bone density testing, colonoscopy, mammograms, etc.   MAMMOGRAMS:  All women over 40 years old should have a yearly mammogram. Many facilities now offer a "3D" mammogram, which may cost around $50 extra out of pocket. If possible,  we recommend you accept the option to have the 3D mammogram performed.  It both reduces the number of women who will be called back for extra views which then turn out to be normal, and it is better than the routine mammogram at detecting truly abnormal areas.    COLONOSCOPY:  Colonoscopy to screen for colon cancer is recommended for all women at age 50.  We know, you hate the idea of the prep.  We agree, BUT, having colon cancer and not knowing it is worse!!  Colon cancer so often starts as a polyp that can be seen and removed at colonscopy, which can quite literally save your life!  And if your first colonoscopy is normal and you have no family history of colon cancer, most women don't have to have it again for 10 years.  Once every ten years, you can do something that may end up saving your life, right?  We will be happy to help you get it scheduled when you are ready.    Be sure to check your insurance coverage so you understand how much it will cost.  It may be covered as a preventative service at no cost, but you should check your particular policy.    Calcium Content in Foods Calcium is the most abundant mineral in the body. Most of the body's calcium supply is stored in bones and teeth. Calcium helps many parts of the body function normally, including: Blood and blood vessels. Nerves. Hormones. Muscles. Bones and teeth. When your calcium stores are low, you may be at risk for low bone mass, bone loss, and broken bones  (fractures). When you get enough calcium, it helps to support strong bones and teeth throughout your life. Calcium is especially important for: Children during growth spurts. Girls during adolescence. Women who are pregnant or breastfeeding. Women after their menstrual cycle stops (postmenopause). Women whose menstrual cycle has stopped due to anorexia nervosa or regular intense exercise. People who cannot eat or digest dairy products. Vegans. Recommended daily amounts of calcium: Women (ages 19 to 50): 1,000 mg per day. Women (ages 51 and older): 1,200 mg per day. Men (ages 19 to 70): 1,000 mg per day. Men (ages 71 and older): 1,200 mg per day. Women (ages 9 to 18): 1,300 mg per day. Men (ages 9 to 18): 1,300 mg per day. General information Eat foods that are high in calcium. Try to get most of your calcium from food. Some people may benefit from taking calcium supplements. Check with your health care provider or diet and nutrition specialist (dietitian) before starting any calcium supplements. Calcium supplements may interact with certain medicines. Too much calcium may cause other health problems, such as constipation and kidney stones. For the body to absorb calcium, it needs vitamin D. Sources of vitamin D include: Skin exposure to direct sunlight. Foods, such as egg yolks, liver, mushrooms, saltwater fish, and fortified milk. Vitamin D supplements. Check with your health care provider or dietitian before starting any vitamin D supplements. What foods are high in calcium?  Foods that are high in calcium contain more than 100 milligrams per serving. Fruits Fortified orange juice or other fruit juice, 300 mg per 8 oz serving. Vegetables Collard greens, 360 mg per 8 oz serving. Kale, 100 mg per 8 oz serving. Bok choy, 160 mg per 8 oz serving. Grains Fortified ready-to-eat cereals, 100 to 1,000 mg per 8 oz serving. Fortified frozen waffles, 200 mg in 2 waffles. Oatmeal, 140 mg in  1 cup. Meats and other proteins Sardines, canned with bones, 325 mg per 3 oz serving. Salmon, canned with bones, 180 mg per 3 oz serving. Canned shrimp, 125 mg per 3 oz serving. Baked beans, 160 mg per 4 oz serving. Tofu, firm, made with calcium sulfate, 253 mg per 4 oz serving. Dairy Yogurt, plain, low-fat, 310 mg per 6 oz serving. Nonfat milk, 300 mg per 8 oz serving. American cheese, 195 mg per 1 oz serving. Cheddar cheese, 205 mg per 1 oz serving. Cottage cheese 2%, 105 mg per 4 oz serving. Fortified soy, rice, or almond milk, 300 mg per 8 oz serving. Mozzarella, part skim, 210 mg per 1 oz serving. The items listed above may not be a complete list of foods high in calcium. Actual amounts of calcium may be different depending on processing. Contact a dietitian for more information. What foods are lower in calcium? Foods that are lower in calcium contain 50 mg or less per serving. Fruits Apple, about 6 mg. Banana, about 12 mg.   Vegetables Lettuce, 19 mg per 2 oz serving. Tomato, about 11 mg. Grains Rice, 4 mg per 6 oz serving. Boiled potatoes, 14 mg per 8 oz serving. White bread, 6 mg per slice. Meats and other proteins Egg, 27 mg per 2 oz serving. Red meat, 7 mg per 4 oz serving. Chicken, 17 mg per 4 oz serving. Fish, cod, or trout, 20 mg per 4 oz serving. Dairy Cream cheese, regular, 14 mg per 1 Tbsp serving. Brie cheese, 50 mg per 1 oz serving. Parmesan cheese, 70 mg per 1 Tbsp serving. The items listed above may not be a complete list of foods lower in calcium. Actual amounts of calcium may be different depending on processing. Contact a dietitian for more information. Summary Calcium is an important mineral in the body because it affects many functions. Getting enough calcium helps support strong bones and teeth throughout your life. Try to get most of your calcium from food. Calcium supplements may interact with certain medicines. Check with your health care provider  or dietitian before starting any calcium supplements. This information is not intended to replace advice given to you by your health care provider. Make sure you discuss any questions you have with your health care provider. Document Revised: 08/05/2019 Document Reviewed: 08/05/2019 Elsevier Patient Education  2023 [employer]  

## 2021-09-15 LAB — ESTRADIOL: Estradiol: <15 pg/mL

## 2021-09-15 LAB — FOLLICLE STIMULATING HORMONE: FSH: 55.5 m[IU]/mL

## 2021-09-27 ENCOUNTER — Encounter: Payer: BC Managed Care – PPO | Attending: Surgery | Admitting: Skilled Nursing Facility1

## 2021-09-27 ENCOUNTER — Encounter: Payer: Self-pay | Admitting: Skilled Nursing Facility1

## 2021-09-27 NOTE — Progress Notes (Signed)
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, able to walk without pain, improve feet issues"   Non scale victories: walking faster on treadmill, improve knee pain, able to move better, wearing smaller sizes, decreased pants by 3 sizes, improved feet issues   Post-Op Sleeve Gastrectomy  Surgery date: 07/29/2017       NUTRITION ASSESSMENT  Start weight at [facility]: 273.4 lbs Weight today: 207 lbs  Body Composition  Scale 09/30/2018 04/01/2019 09/30/2019  Weight  lbs  192.3 188.3 190  BMI  32 31.3 31.3  Total Body Fat % 37.4 36.9 37.2     Visceral Fat '10 10 10  '$ Fat-Free Mass % 62.5 63 62.7     Total Body Water % 45.7 46 45.8     Muscle-Mass lbs 31.8 34.6 31.7  Body Fat Displacement            Torso  lbs 44.5 43 43.8         Left Leg  lbs 8.9 8.6 8.7         Right Leg  lbs 8.9 8.6 8.7         Left Arm  lbs 4.4 4.3 4.3         Right Arm   lbs 4.4 4.3 4.3     Pt states she lost her focus for a while die to a lot of life events but with her husband doing well she is refocusing.  Pt state she is starting to reduce her animal meat intake stating she wants to plant based. Pt states she likes the idea of going plant based and feels she needs to relearn satisfaction cues.  Pt states she will throw up sometimes from possibly overeating or eating too fast. Pt staets she is trying to make her meals for the week. Pt states she controls her anxiety by walking.   24-Hr Dietary Recall B: smoothie: spinach + protein mix + almond milk + fruit Snk: peanut butter crackers (making her own) L: cabbage soup or spaghetti squash + black beans  Snk: yogurt or half banana + peanut butter D: zucchini squash brussels sometimes with salmon or [place] burger Snk: sugar free pudding Bev: water, decaf/caffeinated  coffee + cinomon + cream + sweet n low, cran/apple juice  Estimated daily fluid intake: 50-67 ounces  Estimated daily protein intake: 60+ grams  Medications: See list Supplementation: ProCare, calcium, and hair/skin/nail vitamin   Recent physical activity: 3-4 days a week 20-25 minutes cardio and then weights   Using straws: no Drinking while eating: no Chewing/swallowing difficulties: no Changes in vision: no Changes to mood/headaches: no Hair loss/Changes to skin/nails: no Any difficulty focusing or concentrating: no Sweating: no Dizziness/Lightheadedness: no Palpitations: no  Carbonated beverages: no N/V/D/C/GAS: no, talking mirialax Abdominal Pain: no Dumping syndrome: no    NUTRITION DIAGNOSIS [place]-3.3: Overweight/obesity related to past poor dietary habits and physical inactivity as evidenced by patient w/ recent sleeve gastrectomy surgery following dietary guidelines for continued weight loss.     NUTRITION INTERVENTION Nutrition education and counseling to promote continued progress post bariatric surgery.   Handout Given: Vegetarian protein options  Teaching Method Utilized: Visual, Auditory, Hands on Barriers to learning/adherence to lifestyle change: None Identified  Demonstrated degree of understanding via:  Teach Back    MONITORING / EVALUATION Dietary intake, exercise, body weight, and goals at next  nutrition visit.   Patient is to return to [facility] in 6 months

## 2021-10-11 ENCOUNTER — Other Ambulatory Visit: Payer: Self-pay | Admitting: Family Medicine

## 2021-10-31 ENCOUNTER — Encounter: Payer: Self-pay | Admitting: Internal Medicine

## 2021-10-31 ENCOUNTER — Ambulatory Visit: Payer: BC Managed Care – PPO | Admitting: Internal Medicine

## 2021-10-31 VITALS — BP 124/72 | HR 89 | Ht 65.0 in | Wt 206.0 lb

## 2021-10-31 DIAGNOSIS — E042 Nontoxic multinodular goiter: Secondary | ICD-10-CM | POA: Diagnosis not present

## 2021-10-31 LAB — TSH: TSH: 1.99 u[IU]/mL (ref 0.35–5.50)

## 2021-10-31 LAB — T4, FREE: Free T4: 0.65 ng/dL (ref 0.60–1.60)

## 2021-10-31 NOTE — Progress Notes (Signed)
Name: Makayla Good  MRN/ DOB: [id number], 04-13-1971    Age/ Sex: 51 y.o., female    PCP: [name], DO   Reason for Endocrinology Evaluation: MNG     Date of Initial Endocrinology Evaluation: 12/27/2020    HPI: Makayla Good is a 51 y.o. female with a past medical history of OSA, MNG and HTN. The patient presented for initial endocrinology clinic visit on 12/27/2020 for consultative assistance with her MNG.   She had an incidental finding of thyroid nodule on cervical CT scan which prompted a thyroid ultrasound demonstrating MNG but none meeting criteria for FNA .  SUBJECTIVE:    Today (10/31/21):  Makayla Good is here for a follow up on MNG.  Denies local neck symptoms , rare dysphagia  Denies diarrhea or constipation  Rare palpitations  Anxiety issues are improving  Denies tremors        HISTORY:  Past Medical History:  Past Medical History:  Diagnosis Date   Asthma    Eczema    Fibroid    H/O oophorectomy    Hypertension    Obesity    Polycystic ovary    Left   Sleep apnea    no CPAP per MD   Past Surgical History:  Past Surgical History:  Procedure Laterality Date   BACK SURGERY     66yr ago   BREAST BIOPSY Right 2019   fibroadenoma   CESAREAN SECTION  02/01/2005   CESAREAN SECTION  2001   LAPAROSCOPIC GASTRIC SLEEVE RESECTION N/A 07/29/2017   Procedure: LAPAROSCOPIC GASTRIC SLEEVE RESECTION WITH UPPER ENDO ;  Surgeon: CClovis Riley MD;  Location: [facility];  Service: General;  Laterality: N/A;   OOPHORECTOMY     Rt.removed   TUBAL LIGATION  02/01/2005    Social History:  reports that she has been smoking cigarettes and e-cigarettes. She started smoking about 18 months ago. She has a 15.00 pack-year smoking history. She has never used smokeless tobacco. She reports current alcohol use. She reports that she does not use drugs. Family History: family history includes Alzheimer's disease in her father;  Arthritis in her sister; Coronary artery disease in her father; Dementia in her maternal aunt and mother; Hypertension in her father, mother, sister, and sister; Kidney disease in an other family member; Mental illness in her mother; Schizophrenia in her mother.   HOME MEDICATIONS: Allergies as of 10/31/2021   No Known Allergies      Medication List        Accurate as of October 31, 2021  4:31 PM. If you have any questions, ask your nurse or doctor.          STOP taking these medications    ALPRAZolam 0.25 MG tablet Commonly known as: XDuanne MoronStopped by: IDorita Sciara MD   busPIRone 7.5 MG tablet Commonly known as: BUSPAR Stopped by: IDorita Sciara MD       TAKE these medications    amLODipine 5 MG tablet Commonly known as: NORVASC Take 1 tablet (5 mg total) by mouth daily.   ASHWAGANDHA PO Take 1 tablet by mouth daily.   CALCIUM PO Take by mouth.   CELEBRATE MULTI-COMPLETE 36 PO Take 1 each by mouth daily.   fluticasone 50 MCG/ACT nasal spray Commonly known as: FLONASE SPRAY 2 SPRAYS INTO EACH NOSTRIL EVERY DAY   HAIR/SKIN/NAILS PO Take 1 tablet by mouth daily.   hydrochlorothiazide 25 MG tablet Commonly known as:  HYDRODIURIL TAKE 1 TABLET BY MOUTH EVERY DAY   meclizine 12.5 MG tablet Commonly known as: ANTIVERT Take 1 tablet (12.5 mg total) by mouth 3 (three) times daily as needed for dizziness.   nicotine 14 mg/24hr patch Commonly known as: NICODERM CQ - dosed in mg/24 hours Place 1 patch (14 mg total) onto the skin daily.   potassium chloride 10 MEQ tablet Commonly known as: KLOR-CON TAKE 1 TABLET BY MOUTH EVERY DAY          REVIEW OF SYSTEMS: A comprehensive ROS was conducted with the patient and is negative except as per HPI    OBJECTIVE:  VS: BP 124/72 (BP Location: Left Arm, Patient Position: Sitting, Cuff Size: Large)   Pulse 89   Ht '5\' 5"'$  (1.651 m)   Wt 206 lb (93.4 kg)   LMP 07/27/2020 (Within Days)   SpO2 97%    BMI 34.28 kg/m    Wt Readings from Last 3 Encounters:  10/31/21 206 lb (93.4 kg)  09/27/21 207 lb (93.9 kg)  09/14/21 202 lb (91.6 kg)     EXAM: General: Pt appears well and is in NAD  Neck: General: Supple without adenopathy. Thyroid: Right thyroid nodule appreciated  Lungs: Clear with good BS bilat with no rales, rhonchi, or wheezes  Heart: Auscultation: RRR.  Abdomen: Normoactive bowel sounds, soft, nontender, without masses or organomegaly palpable  Extremities:  BL LE: No pretibial edema normal ROM and strength.  Mental Status: Judgment, insight: Intact Orientation: Oriented to time, place, and person Mood and affect: No depression, anxiety, or agitation     DATA REVIEWED:  Latest Reference Range & Units 10/31/21 14:35  TSH 0.35 - 5.50 uIU/mL 1.99  T4,Free(Direct) 0.60 - 1.60 ng/dL 0.65      Thyroid Ultrasound 10/11/2020   CLINICAL DATA:  Incidental on CT. Suspected bilateral thyroid nodules demonstrated on cervical spine CT performed 09/30/2020   EXAM: THYROID ULTRASOUND   TECHNIQUE: Ultrasound examination of the thyroid gland and adjacent soft tissues was performed.   COMPARISON:  Cervical spine CT-09/30/2020   FINDINGS: Parenchymal Echotexture: Normal   Isthmus: Normal in size measuring 0.4 cm in diameter   Right lobe: Borderline enlarged measuring 5.5 x 2.4 x 2.4 cm   Left lobe: Normal in size measuring 4.5 x 2.1 x 2.1 cm   _________________________________________________________   Estimated total number of nodules >/= 1 cm: 1   Number of spongiform nodules >/=  2 cm not described below (TR1): 0   Number of mixed cystic and solid nodules >/= 1.5 cm not described below (TR2): 0   _________________________________________________________   There is an approximately 0.8 x 0.6 x 0.4 cm hypoechoic nodule within the inferior pole the right lobe of the thyroid (labeled 1), which does not meet criteria to recommend percutaneous sampling  or continued dedicated follow-up.   There is an approximately 0.8 cm isoechoic nodule within mid aspect the right lobe of the thyroid (labeled 2), which does not meet criteria to recommend percutaneous sampling or continued dedicated follow-up.   _________________________________________________________   Nodule # 3:   Location: Right; Mid - correlates with the nodule seen on preceding cervical spine CT   Maximum size: 1.4 cm; Other 2 dimensions: 1.2 x 1.0 cm   Composition: solid/almost completely solid (2)   Echogenicity: isoechoic (1)   Shape: not taller-than-wide (0)   Margins: smooth (0)   Echogenic foci: none (0)   ACR TI-RADS total points: 3.   ACR TI-RADS risk category: TR3 (3  points).   ACR TI-RADS recommendations:   Given size (<1.4 cm) and appearance, this nodule does NOT meet TI-RADS criteria for biopsy or dedicated follow-up.   _________________________________________________________   There is an approximately 1.2 x 1.0 x 0.5 cm isoechoic nodule within the superior pole the right lobe of the thyroid (labeled 4), which does not meet criteria to recommend percutaneous sampling or continued dedicated follow-up.   _________________________________________________________   Nodule # 5:   Location: Right; Superior   Maximum size: 1.1 cm; Other 2 dimensions: 0.9 x 0.5 cm   Composition: solid/almost completely solid (2)   Echogenicity: hypoechoic (2)   Shape: not taller-than-wide (0)   Margins: smooth (0)   Echogenic foci: none (0)   ACR TI-RADS total points: 4.   ACR TI-RADS risk category: TR4 (4-6 points).   ACR TI-RADS recommendations:   *Given size (>/= 1 - 1.4 cm) and appearance, a follow-up ultrasound in 1 year should be considered based on TI-RADS criteria.   _________________________________________________________   _________________________________________________________   There is an approximately 0.8 x 0.6 x 0.5 cm  hypoechoic nodule within the inferior pole the left lobe of the thyroid (labeled 6), correlating with the nodule seen on preceding cervical spine CT, does not meet criteria to recommend percutaneous sampling or continued dedicated follow-up.   IMPRESSION: 1. Borderline thyromegaly with findings suggestive of multinodular goiter. 2. Nodule #5 meets imaging criteria to recommend a 1 year follow-up as clinically indicated.   The above is in keeping with the ACR TI-RADS recommendations - J Am Coll Radiol 2017;14:587-595.   ASSESSMENT/PLAN/RECOMMENDATIONS:   MNG:  - Pt is clinically and bio chemically euthyroid  - No local neck symptoms  -We will proceed with thyroid ultrasound  Follow-up in 1 year   Signed electronically by: Mack Guise, MD  [facility]  [address] [place], [place] [address] Phone: [phone] FAX: [phone]   CC: [name], DO [address] [place] [address] Phone: [phone] Fax: [phone]   Return to Endocrinology clinic as below: Future Appointments  Date Time Provider [place]  04/09/2022  3:15 PM Ruby Cola, RD [facility]  11/02/2022  1:00 PM [place], Melanie Crazier, MD [id number] None

## 2021-11-01 LAB — T3: T3, Total: 106 ng/dL (ref 76–181)

## 2021-11-06 ENCOUNTER — Other Ambulatory Visit: Payer: Self-pay | Admitting: Family Medicine

## 2021-11-06 DIAGNOSIS — Z1231 Encounter for screening mammogram for malignant neoplasm of breast: Secondary | ICD-10-CM

## 2021-11-07 ENCOUNTER — Other Ambulatory Visit: Payer: Self-pay | Admitting: Family Medicine

## 2021-11-07 DIAGNOSIS — I10 Essential (primary) hypertension: Secondary | ICD-10-CM

## 2021-11-21 ENCOUNTER — Ambulatory Visit (HOSPITAL_BASED_OUTPATIENT_CLINIC_OR_DEPARTMENT_OTHER)
Admission: RE | Admit: 2021-11-21 | Discharge: 2021-11-21 | Disposition: A | Discharge status: Home / Self Care | Payer: BC Managed Care – PPO | Source: Ambulatory Visit | Attending: Internal Medicine | Admitting: Internal Medicine

## 2021-11-21 DIAGNOSIS — E042 Nontoxic multinodular goiter: Secondary | ICD-10-CM | POA: Insufficient documentation

## 2021-11-21 DIAGNOSIS — E041 Nontoxic single thyroid nodule: Secondary | ICD-10-CM | POA: Diagnosis not present

## 2021-12-05 ENCOUNTER — Ambulatory Visit: Payer: BC Managed Care – PPO | Admitting: Internal Medicine

## 2021-12-05 ENCOUNTER — Encounter: Payer: Self-pay | Admitting: Internal Medicine

## 2021-12-05 VITALS — BP 124/68 | HR 68 | Temp 98.7°F | Resp 16 | Ht 65.0 in | Wt 208.0 lb

## 2021-12-05 DIAGNOSIS — Z23 Encounter for immunization: Secondary | ICD-10-CM

## 2021-12-05 DIAGNOSIS — T7840XA Allergy, unspecified, initial encounter: Secondary | ICD-10-CM

## 2021-12-05 MED ORDER — CEPHALEXIN 500 MG PO CAPS: 500.0000 mg | ORAL_CAPSULE | Freq: Four times a day (QID) | ORAL | 0 refills | Status: DC

## 2021-12-05 MED ORDER — PREDNISONE 10 MG PO TABS: ORAL_TABLET | ORAL | 0 refills | Status: DC

## 2021-12-05 NOTE — Patient Instructions (Signed)
Take prednisone as prescribed for few days  Ice the area twice daily  Use hydrocortisone 1% cream (over-the-counter) twice daily  Take Benadryl as needed for itching  If you are not gradually improving in the next 2 to 3 days, okay to take an antibiotic, see printed prescription.  If you get worse definitely call the clinic for further advice.

## 2021-12-05 NOTE — Progress Notes (Signed)
   Subjective:    Patient ID: Makayla Good, female    DOB: [birth year], 51 y.o.   MRN: [id number]  DOS:  12/05/2021 Type of visit - description: Acute  Yesterday, was at the park and noted a insect on her right hand, apparently the insect bite her before she could react. She is not sure if it was a spider, or some other creature.  Does not think it was a bee.  Overnight the hand got swollen, itchy. She is placing ice and it looks slightly better this afternoon.  No fever chills No openings or discharge No generalized itching.   Review of Systems See above   Past Medical History:  Diagnosis Date   Asthma    Eczema    Fibroid    H/O oophorectomy    Hypertension    Obesity    Polycystic ovary    Left   Sleep apnea    no CPAP per MD    Past Surgical History:  Procedure Laterality Date   BACK SURGERY     18yr ago   BREAST BIOPSY Right 2019   fibroadenoma   CESAREAN SECTION  02/01/2005   CESAREAN SECTION  2001   LAPAROSCOPIC GASTRIC SLEEVE RESECTION N/A 07/29/2017   Procedure: LAPAROSCOPIC GASTRIC SLEEVE RESECTION WITH UPPER ENDO ;  Surgeon: CClovis Riley MD;  Location: [facility];  Service: General;  Laterality: N/A;   OOPHORECTOMY     Rt.removed   TUBAL LIGATION  02/01/2005    Current Outpatient Medications  Medication Instructions   amLODipine (NORVASC) 5 mg, Oral, Daily   ASHWAGANDHA PO 1 tablet, Oral, Daily   Biotin w/ Vitamins C & E (HAIR/SKIN/NAILS PO) 1 tablet, Oral, Daily   CALCIUM PO Oral   fluticasone (FLONASE) 50 MCG/ACT nasal spray SPRAY 2 SPRAYS INTO EACH NOSTRIL EVERY DAY   hydrochlorothiazide (HYDRODIURIL) 25 MG tablet TAKE 1 TABLET BY MOUTH EVERY DAY   meclizine (ANTIVERT) 12.5 mg, Oral, 3 times daily PRN   Multiple Vitamins-Minerals (CELEBRATE MULTI-COMPLETE 36 PO) 1 each, Oral, Daily   nicotine (NICODERM CQ - DOSED IN MG/24 HOURS) 14 mg, Transdermal, Daily   potassium chloride (KLOR-CON) 10 MEQ tablet TAKE 1 TABLET BY MOUTH EVERY  DAY       Objective:   Physical Exam BP 124/68   Pulse 68   Temp 98.7 F (37.1 C) (Oral)   Resp 16   Ht '5\' 5"'$  (1.651 m)   Wt 208 lb (94.3 kg)   LMP 07/27/2020 (Within Days)   SpO2 96%   BMI 34.61 kg/m  General:   Well developed, NAD, BMI noted. HEENT:  Normocephalic . Face symmetric, atraumatic Upper extremity: R hand: Dorsum of the hand is swollen, warm to touch, minimal TTP.  Distal fingers normal.  See picture. Lower extremities: no pretibial edema bilaterally  Skin: Not pale. Not jaundice Neurologic:  alert & oriented X3.  Speech normal, gait appropriate for age and unassisted Psych--  Cognition and judgment appear intact.  Cooperative with normal attention span and concentration.  Behavior appropriate. No anxious or depressed appearing.       Assessment     51year old female, PMH includes morbid obesity, high cholesterol, HTN, presents with:  Allergic reaction: Symptoms she had an allergic reaction to insect bite? Plan: Update Tdap Prednisone for few days, ice, hydrocortisone 1%. If not gradually better she may be developing cellulitis, start Keflex, see prescription.  See AVS.

## 2021-12-08 ENCOUNTER — Ambulatory Visit (INDEPENDENT_AMBULATORY_CARE_PROVIDER_SITE_OTHER): Payer: BC Managed Care – PPO | Admitting: Family Medicine

## 2021-12-08 ENCOUNTER — Encounter: Payer: Self-pay | Admitting: Family Medicine

## 2021-12-08 ENCOUNTER — Ambulatory Visit: Payer: BC Managed Care – PPO

## 2021-12-08 VITALS — BP 122/78 | HR 93 | Temp 98.7°F | Ht 65.0 in | Wt 207.4 lb

## 2021-12-08 DIAGNOSIS — E2839 Other primary ovarian failure: Secondary | ICD-10-CM | POA: Diagnosis not present

## 2021-12-08 DIAGNOSIS — E785 Hyperlipidemia, unspecified: Secondary | ICD-10-CM | POA: Diagnosis not present

## 2021-12-08 DIAGNOSIS — I83811 Varicose veins of right lower extremities with pain: Secondary | ICD-10-CM | POA: Insufficient documentation

## 2021-12-08 DIAGNOSIS — I10 Essential (primary) hypertension: Secondary | ICD-10-CM | POA: Diagnosis not present

## 2021-12-08 DIAGNOSIS — Z0000 Encounter for general adult medical examination without abnormal findings: Secondary | ICD-10-CM | POA: Diagnosis not present

## 2021-12-08 MED ORDER — NICOTINE 14 MG/24HR TD PT24: 14.0000 mg | MEDICATED_PATCH | Freq: Every day | TRANSDERMAL | 1 refills | Status: DC

## 2021-12-08 MED ORDER — NICOTINE 7 MG/24HR TD PT24: 7.0000 mg | MEDICATED_PATCH | Freq: Every day | TRANSDERMAL | 0 refills | Status: DC

## 2021-12-08 NOTE — Patient Instructions (Signed)
Preventive Care 40-51 Years Old, Female Preventive care refers to lifestyle choices and visits with your health care provider that can promote health and wellness. Preventive care visits are also called wellness exams. What can I expect for my preventive care visit? Counseling Your health care provider may ask you questions about your: Medical history, including: Past medical problems. Family medical history. Pregnancy history. Current health, including: Menstrual cycle. Method of birth control. Emotional well-being. Home life and relationship well-being. Sexual activity and sexual health. Lifestyle, including: Alcohol, nicotine or tobacco, and drug use. Access to firearms. Diet, exercise, and sleep habits. Work and work environment. Sunscreen use. Safety issues such as seatbelt and bike helmet use. Physical exam Your health care provider will check your: Height and weight. These may be used to calculate your BMI (body mass index). BMI is a measurement that tells if you are at a healthy weight. Waist circumference. This measures the distance around your waistline. This measurement also tells if you are at a healthy weight and may help predict your risk of certain diseases, such as type 2 diabetes and high blood pressure. Heart rate and blood pressure. Body temperature. Skin for abnormal spots. What immunizations do I need?  Vaccines are usually given at various ages, according to a schedule. Your health care provider will recommend vaccines for you based on your age, medical history, and lifestyle or other factors, such as travel or where you work. What tests do I need? Screening Your health care provider may recommend screening tests for certain conditions. This may include: Lipid and cholesterol levels. Diabetes screening. This is done by checking your blood sugar (glucose) after you have not eaten for a while (fasting). Pelvic exam and Pap test. Hepatitis B test. Hepatitis C  test. HIV (human immunodeficiency virus) test. STI (sexually transmitted infection) testing, if you are at risk. Lung cancer screening. Colorectal cancer screening. Mammogram. Talk with your health care provider about when you should start having regular mammograms. This may depend on whether you have a family history of breast cancer. BRCA-related cancer screening. This may be done if you have a family history of breast, ovarian, tubal, or peritoneal cancers. Bone density scan. This is done to screen for osteoporosis. Talk with your health care provider about your test results, treatment options, and if necessary, the need for more tests. Follow these instructions at home: Eating and drinking  Eat a diet that includes fresh fruits and vegetables, whole grains, lean protein, and low-fat dairy products. Take vitamin and mineral supplements as recommended by your health care provider. Do not drink alcohol if: Your health care provider tells you not to drink. You are pregnant, may be pregnant, or are planning to become pregnant. If you drink alcohol: Limit how much you have to 0-1 drink a day. Know how much alcohol is in your drink. In the [place] one drink equals one 12 oz bottle of beer (355 mL), one 5 oz glass of wine (148 mL), or one 1 oz glass of hard liquor (44 mL). Lifestyle Brush your teeth every morning and night with fluoride toothpaste. Floss one time each day. Exercise for at least 30 minutes 5 or more days each week. Do not use any products that contain nicotine or tobacco. These products include cigarettes, chewing tobacco, and vaping devices, such as e-cigarettes. If you need help quitting, ask your health care provider. Do not use drugs. If you are sexually active, practice safe sex. Use a condom or other form of protection to   prevent STIs. If you do not wish to become pregnant, use a form of birth control. If you plan to become pregnant, see your health care provider for a  prepregnancy visit. Take aspirin only as told by your health care provider. Make sure that you understand how much to take and what form to take. Work with your health care provider to find out whether it is safe and beneficial for you to take aspirin daily. Find healthy ways to manage stress, such as: Meditation, yoga, or listening to music. Journaling. Talking to a trusted person. Spending time with friends and family. Minimize exposure to UV radiation to reduce your risk of skin cancer. Safety Always wear your seat belt while driving or riding in a vehicle. Do not drive: If you have been drinking alcohol. Do not ride with someone who has been drinking. When you are tired or distracted. While texting. If you have been using any mind-altering substances or drugs. Wear a helmet and other protective equipment during sports activities. If you have firearms in your house, make sure you follow all gun safety procedures. Seek help if you have been physically or sexually abused. What's next? Visit your health care provider once a year for an annual wellness visit. Ask your health care provider how often you should have your eyes and teeth checked. Stay up to date on all vaccines. This information is not intended to replace advice given to you by your health care provider. Make sure you discuss any questions you have with your health care provider. Document Revised: 10/05/2020 Document Reviewed: 10/05/2020 Elsevier Patient Education  [place].

## 2021-12-08 NOTE — Assessment & Plan Note (Signed)
con't with compression  Pt wants to hold off on vascular referral

## 2021-12-08 NOTE — Progress Notes (Signed)
Subjective:     Makayla Good is a 51 y.o. female and is here for a comprehensive physical exam. The patient reports problems - vein R leg , just started to cause discomfort.  Pt is wearing compression most days .  Social History   Socioeconomic History   Marital status: Married    Spouse name: Not on file   Number of children: Not on file   Years of education: 13   Highest education level: Not on file  Occupational History   Occupation: release liens    Employer: [employer]  Tobacco Use   Smoking status: Some Days    Packs/day: 0.50    Years: 30.00    Total pack years: 15.00    Types: Cigarettes, E-cigarettes    Start date: 04/28/2020   Smokeless tobacco: Never   Tobacco comments:    VAPES  Vaping Use   Vaping Use: Some days  Substance and Sexual Activity   Alcohol use: Yes    Comment: occasional   Drug use: No   Sexual activity: Not Currently    Partners: Male    Birth control/protection: Surgical    Comment: Tubal/been with same person over 20 years  Other Topics Concern   Not on file  Social History Narrative   Exercise--gym, every other day--- at least 3 days a week   Social Determinants of Health   Financial Resource Strain: Not on file  Food Insecurity: Food Insecurity Present (03/07/2017)   Hunger Vital Sign    Worried About Running Out of Food in the Last Year: Sometimes true    Ran Out of Food in the Last Year: Never true  Transportation Needs: Not on file  Physical Activity: Not on file  Stress: Not on file  Social Connections: Not on file  Intimate Partner Violence: Not on file   Health Maintenance  Topic Date Due   COVID-19 Vaccine (3 - [place] series) 10/02/2019   MAMMOGRAM  12/07/2021   INFLUENZA VACCINE  02/04/2022 (Originally 11/21/2021)   PAP SMEAR-Modifier  05/23/2023   COLONOSCOPY (Pts 45-33yr Insurance coverage will need to be confirmed)  04/07/2029   TETANUS/TDAP  12/06/2031   Hepatitis C Screening  Completed   HIV  Screening  Completed   Zoster Vaccines- Shingrix  Completed   HPV VACCINES  Aged Out    The following portions of the patient's history were reviewed and updated as appropriate: She  has a past medical history of Asthma, Eczema, Fibroid, H/O oophorectomy, Hypertension, Obesity, Polycystic ovary, and Sleep apnea. She does not have any pertinent problems on file. She  has a past surgical history that includes Cesarean section (02/01/2005); Tubal ligation (02/01/2005); Cesarean section (2001); Laparoscopic gastric sleeve resection (N/A, 07/29/2017); Oophorectomy; Breast biopsy (Right, 2019); and Back surgery. Her family history includes Alzheimer's disease in her father; Arthritis in her sister; Coronary artery disease in her father; Dementia in her maternal aunt and mother; Hypertension in her father, mother, sister, and sister; Kidney disease in an other family member; Mental illness in her mother; Schizophrenia in her mother. She  reports that she has been smoking cigarettes and e-cigarettes. She started smoking about 19 months ago. She has a 15.00 pack-year smoking history. She has never used smokeless tobacco. She reports current alcohol use. She reports that she does not use drugs. She has a current medication list which includes the following prescription(s): amlodipine, ashwagandha, biotin w/ vitamins c & e, calcium, fluticasone, hydrochlorothiazide, multiple vitamins-minerals, nicotine, potassium chloride, prednisone,  and nicotine. Current Outpatient Medications on File Prior to Visit  Medication Sig Dispense Refill   amLODipine (NORVASC) 5 MG tablet TAKE 1 TABLET (5 MG TOTAL) BY MOUTH DAILY. 90 tablet 1   ASHWAGANDHA PO Take 1 tablet by mouth daily.     Biotin w/ Vitamins C & E (HAIR/SKIN/NAILS PO) Take 1 tablet by mouth daily.     CALCIUM PO Take by mouth.     fluticasone (FLONASE) 50 MCG/ACT nasal spray SPRAY 2 SPRAYS INTO EACH NOSTRIL EVERY DAY 48 mL 2   hydrochlorothiazide (HYDRODIURIL) 25  MG tablet TAKE 1 TABLET BY MOUTH EVERY DAY 90 tablet 1   Multiple Vitamins-Minerals (CELEBRATE MULTI-COMPLETE 36 PO) Take 1 each by mouth daily.     potassium chloride (KLOR-CON) 10 MEQ tablet TAKE 1 TABLET BY MOUTH EVERY DAY 90 tablet 1   predniSONE (DELTASONE) 10 MG tablet 3 tabs x 3 days, 2 tabs x 3 days, 1 tab x 3 days 18 tablet 0   No current facility-administered medications on file prior to visit.   She has No Known Allergies..  Review of Systems Review of Systems  Constitutional: Negative for activity change, appetite change and fatigue.  HENT: Negative for hearing loss, congestion, tinnitus and ear discharge.  dentist q25mEyes: Negative for visual disturbance (see optho q1y -- vision corrected to 20/20 with glasses).  Respiratory: Negative for cough, chest tightness and shortness of breath.   Cardiovascular: Negative for chest pain, palpitations and leg swelling.  Gastrointestinal: Negative for abdominal pain, diarrhea, constipation and abdominal distention.  Genitourinary: Negative for urgency, frequency, decreased urine volume and difficulty urinating.  Musculoskeletal: Negative for back pain, arthralgias and gait problem.  Skin: Negative for color change, pallor and rash.  Neurological: Negative for dizziness, light-headedness, numbness and headaches.  Hematological: Negative for adenopathy. Does not bruise/bleed easily.  Psychiatric/Behavioral: Negative for suicidal ideas, confusion, sleep disturbance, self-injury, dysphoric mood, decreased concentration and agitation.     Objective:    BP 122/78   Pulse 93   Temp 98.7 F (37.1 C) (Oral)   Ht '5\' 5"'$  (1.651 m)   Wt 207 lb 6.4 oz (94.1 kg)   LMP 07/27/2020 (Within Days)   SpO2 98%   BMI 34.51 kg/m  General appearance: alert, cooperative, appears stated age, and no distress Head: Normocephalic, without obvious abnormality, atraumatic Eyes: conjunctivae/corneas clear. PERRL, EOM's intact. Fundi benign. Ears: normal  TM's and external ear canals both ears Nose: Nares normal. Septum midline. Mucosa normal. No drainage or sinus tenderness. Throat: lips, mucosa, and tongue normal; teeth and gums normal Neck: no adenopathy, no carotid bruit, no JVD, supple, symmetrical, trachea midline, and thyroid not enlarged, symmetric, no tenderness/mass/nodules Back: symmetric, no curvature. ROM normal. No CVA tenderness. Lungs: clear to auscultation bilaterally Heart: regular rate and rhythm, S1, S2 normal, no murmur, click, rub or gallop Abdomen: soft, non-tender; bowel sounds normal; no masses,  no organomegaly Extremities: + small varicose vein R low let,  no calf pain,  mild nonpitting edema b/l ankles  Pulses: 2+ and symmetric Skin: Skin color, texture, turgor normal. No rashes or lesions Lymph nodes: Cervical, supraclavicular, and axillary nodes normal. Neurologic: Alert and oriented X 3, normal strength and tone. Normal symmetric reflexes. Normal coordination and gait    Assessment:    Healthy female exam.      Plan:    Ghm utd Check labs  See After Visit Summary for Counseling Recommendations   1. Primary hypertension Well controlled, no changes to meds. Encouraged  heart healthy diet such as the DASH diet and exercise as tolerated.   - CBC with Differential/Platelet - Comprehensive metabolic panel - Lipid panel - TSH  2. Hyperlipidemia LDL goal <100 Encourage heart healthy diet such as MIND or DASH diet, increase exercise, avoid trans fats, simple carbohydrates and processed foods, consider a krill or fish or flaxseed oil cap daily.   - CBC with Differential/Platelet - Comprehensive metabolic panel - Lipid panel - TSH  3. Hyperlipidemia, unspecified hyperlipidemia type   - CBC with Differential/Platelet - Comprehensive metabolic panel - Lipid panel - TSH  4. Preventative health care See above  - CBC with Differential/Platelet - Comprehensive metabolic panel - Lipid panel - TSH  5.  Estrogen deficiency   - DG Bone Density; Future

## 2021-12-09 LAB — COMPREHENSIVE METABOLIC PANEL
AG Ratio: 1.4 (calc) (ref 1.0–2.5)
ALT: 26 U/L (ref 6–29)
AST: 25 U/L (ref 10–35)
Albumin: 4.2 g/dL (ref 3.6–5.1)
Alkaline phosphatase (APISO): 83 U/L (ref 37–153)
BUN: 17 mg/dL (ref 7–25)
CO2: 25 mmol/L (ref 20–32)
Calcium: 10 mg/dL (ref 8.6–10.4)
Chloride: 103 mmol/L (ref 98–110)
Creat: 0.79 mg/dL (ref 0.50–1.03)
Globulin: 3 g/dL (calc) (ref 1.9–3.7)
Glucose, Bld: 97 mg/dL (ref 65–99)
Potassium: 4.1 mmol/L (ref 3.5–5.3)
Sodium: 141 mmol/L (ref 135–146)
Total Bilirubin: 0.4 mg/dL (ref 0.2–1.2)
Total Protein: 7.2 g/dL (ref 6.1–8.1)

## 2021-12-09 LAB — CBC WITH DIFFERENTIAL/PLATELET
Absolute Monocytes: 570 cells/uL (ref 200–950)
Basophils Absolute: 53 cells/uL (ref 0–200)
Basophils Relative: 0.7 %
Eosinophils Absolute: 83 cells/uL (ref 15–500)
Eosinophils Relative: 1.1 %
HCT: 38.7 % (ref 35.0–45.0)
Hemoglobin: 13.2 g/dL (ref 11.7–15.5)
Lymphs Abs: 3068 cells/uL (ref 850–3900)
MCH: 32 pg (ref 27.0–33.0)
MCHC: 34.1 g/dL (ref 32.0–36.0)
MCV: 93.9 fL (ref 80.0–100.0)
MPV: 12.5 fL (ref 7.5–12.5)
Monocytes Relative: 7.6 %
Neutro Abs: 3728 cells/uL (ref 1500–7800)
Neutrophils Relative %: 49.7 %
Platelets: 223 10*3/uL (ref 140–400)
RBC: 4.12 10*6/uL (ref 3.80–5.10)
RDW: 12.6 % (ref 11.0–15.0)
Total Lymphocyte: 40.9 %
WBC: 7.5 10*3/uL (ref 3.8–10.8)

## 2021-12-09 LAB — LIPID PANEL
Cholesterol: 186 mg/dL (ref <200)
HDL: 54 mg/dL (ref >=50)
LDL Cholesterol (Calc): 112 mg/dL (calc) — ABNORMAL HIGH
Non-HDL Cholesterol (Calc): 132 mg/dL (calc) — ABNORMAL HIGH (ref <130)
Total CHOL/HDL Ratio: 3.4 (calc) (ref <5.0)
Triglycerides: 102 mg/dL (ref <150)

## 2021-12-09 LAB — TSH: TSH: 1.74 mIU/L

## 2021-12-22 ENCOUNTER — Ambulatory Visit
Admission: RE | Admit: 2021-12-22 | Discharge: 2021-12-22 | Disposition: A | Discharge status: Home / Self Care | Payer: BC Managed Care – PPO | Source: Ambulatory Visit | Attending: Family Medicine | Admitting: Family Medicine

## 2021-12-22 DIAGNOSIS — Z1231 Encounter for screening mammogram for malignant neoplasm of breast: Secondary | ICD-10-CM

## 2021-12-27 ENCOUNTER — Other Ambulatory Visit: Payer: Self-pay | Admitting: Family Medicine

## 2021-12-27 DIAGNOSIS — N644 Mastodynia: Secondary | ICD-10-CM

## 2021-12-29 ENCOUNTER — Other Ambulatory Visit: Payer: Self-pay | Admitting: Family Medicine

## 2021-12-29 DIAGNOSIS — I10 Essential (primary) hypertension: Secondary | ICD-10-CM

## 2021-12-29 DIAGNOSIS — R600 Localized edema: Secondary | ICD-10-CM

## 2022-01-04 ENCOUNTER — Ambulatory Visit
Admission: RE | Admit: 2022-01-04 | Discharge: 2022-01-04 | Disposition: A | Discharge status: Home / Self Care | Payer: BC Managed Care – PPO | Source: Ambulatory Visit | Attending: Family Medicine | Admitting: Family Medicine

## 2022-01-04 DIAGNOSIS — N644 Mastodynia: Secondary | ICD-10-CM | POA: Diagnosis not present

## 2022-01-22 ENCOUNTER — Ambulatory Visit: Payer: BC Managed Care – PPO | Admitting: Family Medicine

## 2022-01-22 ENCOUNTER — Encounter: Payer: Self-pay | Admitting: Family Medicine

## 2022-01-22 VITALS — BP 126/80 | HR 91 | Temp 98.9°F | Resp 12 | Ht 65.0 in | Wt 204.0 lb

## 2022-01-22 DIAGNOSIS — R1011 Right upper quadrant pain: Secondary | ICD-10-CM | POA: Diagnosis not present

## 2022-01-22 DIAGNOSIS — M542 Cervicalgia: Secondary | ICD-10-CM | POA: Diagnosis not present

## 2022-01-22 DIAGNOSIS — M545 Low back pain, unspecified: Secondary | ICD-10-CM | POA: Diagnosis not present

## 2022-01-22 MED ORDER — OMEPRAZOLE 40 MG PO CPDR: 40.0000 mg | DELAYED_RELEASE_CAPSULE | Freq: Every day | ORAL | 0 refills | Status: DC

## 2022-01-22 NOTE — Patient Instructions (Signed)
A few things to remember from today's visit:  RUQ abdominal pain  Acute right-sided low back pain without sciatica  Cervicalgia  Right low back pain seems to be musculoskeletal. Topical icy hat may help. Caution with Ibuprofen, can irrigate stomach. Tylenol 500 mg 3-4 times daily. Stretching exercises.  Trial of Omeprazole 40 mg 30 min before breakfast started today. Avoid foods that can aggravate problem.  Monitor for fever. Please arrange a follow up appt with Dr Etter Sjogren.  If you need refills for medications you take chronically, please call your pharmacy. Do not use My Chart to request refills or for acute issues that need immediate attention. If you send a my chart message, it may take a few days to be addressed, specially if I am not in the office.  Please be sure medication list is accurate. If a new problem present, please set up appointment sooner than planned today.

## 2022-01-22 NOTE — Progress Notes (Signed)
ACUTE VISIT Chief Complaint  Patient presents with   Abdominal Pain    Rib pain on right side & now in her neck x 4-5 days.    HPI: [name] is a 51 y.o. female with medical hx significant for HTN, goiter, HLD, OSA, and OA here today complaining of RUQ for 3 weeks and right-sided back pain for 4-5 days. Reporting these symptoms as new. She is not sure if they are related.  Right-sided back pain exacerbated by certain movement and deep breathing. Achy like pain 5-6/10.  She has not noted rash or skin changes. No hx of trauma or unusual physical activity.  Neck pain, not radiated. This si a chronic problem. She follows with Dr Ernestina Patches and PT.  Took Ibuprofen.  Abdominal Pain This is a new problem. The current episode started 1 to 4 weeks ago. The problem occurs intermittently. The problem has been unchanged. The pain is located in the RUQ and epigastric region. The pain is at a severity of 6/10. The pain is moderate. The quality of the pain is aching. The abdominal pain radiates to the right flank. Associated symptoms include constipation, nausea and vomiting. Pertinent negatives include no anorexia, arthralgias, belching, diarrhea, dysuria, fever, flatus, frequency, headaches, hematochezia, hematuria, melena or weight loss. The pain is aggravated by eating and movement. Her past medical history is significant for abdominal surgery. There is no history of gallstones.  Bowel movements q 1-2 days, small and hard. No blood in stool or melena.  Nausea and vomiting, both have been going on for a while, started after bariatric procedure. Negative for heartburn. 04/2020 RUQ [place] Normal sonographic appearance of the hepatobiliary system. Lab Results  Component Value Date   ALT 26 12/08/2021   AST 25 12/08/2021   ALKPHOS 66 02/23/2021   BILITOT 0.4 12/08/2021   Lab Results  Component Value Date   CREATININE 0.79 12/08/2021   BUN 17 12/08/2021   NA 141 12/08/2021    K 4.1 12/08/2021   CL 103 12/08/2021   CO2 25 12/08/2021   Lab Results  Component Value Date   WBC 7.5 12/08/2021   HGB 13.2 12/08/2021   HCT 38.7 12/08/2021   MCV 93.9 12/08/2021   PLT 223 12/08/2021   Review of Systems  Constitutional:  Negative for fever and weight loss.  Respiratory:  Negative for cough and shortness of breath.   Cardiovascular:  Negative for chest pain, palpitations and leg swelling.  Gastrointestinal:  Positive for abdominal pain, constipation, nausea and vomiting. Negative for anorexia, diarrhea, flatus, hematochezia and melena.  Genitourinary:  Negative for dysuria, frequency and hematuria.  Musculoskeletal:  Positive for back pain and neck pain. Negative for arthralgias.  Skin:  Negative for rash.  Neurological:  Negative for headaches.  Rest see pertinent positives and negatives per HPI.  Current Outpatient Medications on File Prior to Visit  Medication Sig Dispense Refill   amLODipine (NORVASC) 5 MG tablet TAKE 1 TABLET (5 MG TOTAL) BY MOUTH DAILY. 90 tablet 1   ASHWAGANDHA PO Take 1 tablet by mouth daily.     Biotin w/ Vitamins C & E (HAIR/SKIN/NAILS PO) Take 1 tablet by mouth daily.     CALCIUM PO Take by mouth.     fluticasone (FLONASE) 50 MCG/ACT nasal spray SPRAY 2 SPRAYS INTO EACH NOSTRIL EVERY DAY 48 mL 2   hydrochlorothiazide (HYDRODIURIL) 25 MG tablet Take 1 tablet (25 mg total) by mouth daily. 90 tablet 1   Multiple Vitamins-Minerals (  CELEBRATE MULTI-COMPLETE 36 PO) Take 1 each by mouth daily.     nicotine (NICODERM CQ - DOSED IN MG/24 HOURS) 14 mg/24hr patch Place 1 patch (14 mg total) onto the skin daily. 28 patch 1   nicotine (NICODERM CQ) 7 mg/24hr patch Place 1 patch (7 mg total) onto the skin daily. 28 patch 0   potassium chloride (KLOR-CON) 10 MEQ tablet TAKE 1 TABLET BY MOUTH EVERY DAY 90 tablet 1   No current facility-administered medications on file prior to visit.   Past Medical History:  Diagnosis Date   Asthma    Eczema     Fibroid    H/O oophorectomy    Hypertension    Obesity    Polycystic ovary    Left   Sleep apnea    no CPAP per MD   No Known Allergies  Social History   Socioeconomic History   Marital status: Married    Spouse name: Not on file   Number of children: Not on file   Years of education: 13   Highest education level: Not on file  Occupational History   Occupation: release liens    Employer: [employer]  Tobacco Use   Smoking status: Some Days    Packs/day: 0.50    Years: 30.00    Total pack years: 15.00    Types: Cigarettes, E-cigarettes    Start date: 04/28/2020   Smokeless tobacco: Never   Tobacco comments:    VAPES  Vaping Use   Vaping Use: Some days  Substance and Sexual Activity   Alcohol use: Yes    Comment: occasional   Drug use: No   Sexual activity: Not Currently    Partners: Male    Birth control/protection: Surgical    Comment: Tubal/been with same person over 20 years  Other Topics Concern   Not on file  Social History Narrative   Exercise--gym, every other day--- at least 3 days a week   Social Determinants of Health   Financial Resource Strain: Not on file  Food Insecurity: Food Insecurity Present (03/07/2017)   Hunger Vital Sign    Worried About Running Out of Food in the Last Year: Sometimes true    Ran Out of Food in the Last Year: Never true  Transportation Needs: Not on file  Physical Activity: Not on file  Stress: Not on file  Social Connections: Not on file   Vitals:   01/22/22 1413  BP: 126/80  Pulse: 91  Resp: 12  Temp: 98.9 F (37.2 C)  SpO2: 99%   Body mass index is 33.95 kg/m.  Physical Exam Vitals and nursing note reviewed.  Constitutional:      General: She is not in acute distress.    Appearance: She is well-developed.  HENT:     Head: Normocephalic and atraumatic.     Mouth/Throat:     Mouth: Mucous membranes are moist.     Pharynx: Oropharynx is clear.  Eyes:     Conjunctiva/sclera: Conjunctivae normal.   Cardiovascular:     Rate and Rhythm: Normal rate and regular rhythm.     Pulses:          Dorsalis pedis pulses are 2+ on the right side and 2+ on the left side.     Heart sounds: No murmur heard. Pulmonary:     Effort: Pulmonary effort is normal. No respiratory distress.     Breath sounds: Normal breath sounds.  Abdominal:     Palpations: Abdomen is  soft. There is no hepatomegaly or mass.     Tenderness: There is abdominal tenderness (RUQ > epigastric.) in the right upper quadrant and epigastric area.  Musculoskeletal:     Cervical back: No tenderness or bony tenderness. Pain with movement present. Normal range of motion.     Thoracic back: Tenderness present. No bony tenderness.       Back:  Lymphadenopathy:     Cervical: No cervical adenopathy.  Skin:    General: Skin is warm.     Findings: No erythema or rash.  Neurological:     General: No focal deficit present.     Mental Status: She is alert and oriented to person, place, and time.     Cranial Nerves: No cranial nerve deficit.     Gait: Gait normal.  Psychiatric:     Comments: Well groomed, good eye contact.   ASSESSMENT AND PLAN:  Ms.Jashay was seen today for abdominal pain.  Diagnoses and all orders for this visit: Orders Placed This Encounter  Procedures   US Abdomen Limited RUQ (LIVER/GB)   CBC   Comprehensive metabolic panel   Urinalysis, Routine w reflex microscopic   Lab Results  Component Value Date   WBC 6.7 01/22/2022   HGB 12.4 01/22/2022   HCT 37.1 01/22/2022   MCV 94.3 01/22/2022   PLT 213.0 01/22/2022   Lab Results  Component Value Date   CREATININE 0.70 01/22/2022   BUN 13 01/22/2022   NA 142 01/22/2022   K 4.1 01/22/2022   CL 103 01/22/2022   CO2 28 01/22/2022   Lab Results  Component Value Date   ALT 16 01/22/2022   AST 21 01/22/2022   ALKPHOS 94 01/22/2022   BILITOT 0.6 01/22/2022   Acute right-sided low back pain without sciatica Seems to be musculoskeletal. Icy hot on  affected area may help. I do not think imaging is needed today. Caution with NSAID's, recommend Tylenol 500 mg 3-4 times daily prn.  RUQ abdominal pain We discussed possible etiologies. Dyspepsia, gallbladder disease among some to consider. Constipation may be a contributing factor, increase fiber ans fluid intake. She agrees with trial of PPI, Omeprazole 40 mg 30 min before breakfast. RUQ [place] will be arranged. Further recommendations according to lab results. F/U with PCP in 2-3 weeks. Instructed about warning signs.  Cervicalgia Chronic. Continue following with PCP and ortho.  Return in about 3 weeks (around 02/12/2022) for Abdominal and back pain with PCP.Marland Kitchen  [place] Hijazi G. [place], MD  [facility]. [place] office.

## 2022-01-23 LAB — URINALYSIS, ROUTINE W REFLEX MICROSCOPIC
Bilirubin Urine: NEGATIVE
Hgb urine dipstick: NEGATIVE
Ketones, ur: NEGATIVE
Leukocytes,Ua: NEGATIVE
Nitrite: NEGATIVE
RBC / HPF: NONE SEEN (ref 0–?)
Specific Gravity, Urine: 1.015 (ref 1.000–1.030)
Total Protein, Urine: NEGATIVE
Urine Glucose: NEGATIVE
Urobilinogen, UA: 0.2 (ref 0.0–1.0)
pH: 6 (ref 5.0–8.0)

## 2022-01-23 LAB — COMPREHENSIVE METABOLIC PANEL
ALT: 16 U/L (ref 0–35)
AST: 21 U/L (ref 0–37)
Albumin: 4.1 g/dL (ref 3.5–5.2)
Alkaline Phosphatase: 94 U/L (ref 39–117)
BUN: 13 mg/dL (ref 6–23)
CO2: 28 mEq/L (ref 19–32)
Calcium: 10 mg/dL (ref 8.4–10.5)
Chloride: 103 mEq/L (ref 96–112)
Creatinine, Ser: 0.7 mg/dL (ref 0.40–1.20)
GFR: 100.29 mL/min (ref >60.00)
Glucose, Bld: 93 mg/dL (ref 70–99)
Potassium: 4.1 mEq/L (ref 3.5–5.1)
Sodium: 142 mEq/L (ref 135–145)
Total Bilirubin: 0.6 mg/dL (ref 0.2–1.2)
Total Protein: 7.5 g/dL (ref 6.0–8.3)

## 2022-01-23 LAB — CBC
HCT: 37.1 % (ref 36.0–46.0)
Hemoglobin: 12.4 g/dL (ref 12.0–15.0)
MCHC: 33.6 g/dL (ref 30.0–36.0)
MCV: 94.3 fl (ref 78.0–100.0)
Platelets: 213 10*3/uL (ref 150.0–400.0)
RBC: 3.93 Mil/uL (ref 3.87–5.11)
RDW: 13 % (ref 11.5–15.5)
WBC: 6.7 10*3/uL (ref 4.0–10.5)

## 2022-01-24 ENCOUNTER — Telehealth: Payer: Self-pay | Admitting: Physical Medicine and Rehabilitation

## 2022-01-24 NOTE — Telephone Encounter (Signed)
Pt called and states that her neck pain and back pain and back pain has gotten worse. She is wafting to get some type of relief wether its injections or therapy and her PCP told her to see [facility].   Cb [phone]

## 2022-01-25 ENCOUNTER — Emergency Department (HOSPITAL_BASED_OUTPATIENT_CLINIC_OR_DEPARTMENT_OTHER): Payer: BC Managed Care – PPO

## 2022-01-25 ENCOUNTER — Encounter (HOSPITAL_BASED_OUTPATIENT_CLINIC_OR_DEPARTMENT_OTHER): Payer: Self-pay | Admitting: Emergency Medicine

## 2022-01-25 ENCOUNTER — Other Ambulatory Visit: Payer: BC Managed Care – PPO

## 2022-01-25 ENCOUNTER — Other Ambulatory Visit: Payer: Self-pay

## 2022-01-25 ENCOUNTER — Ambulatory Visit: Payer: BC Managed Care – PPO | Admitting: Physical Medicine and Rehabilitation

## 2022-01-25 ENCOUNTER — Encounter: Payer: Self-pay | Admitting: Physical Medicine and Rehabilitation

## 2022-01-25 ENCOUNTER — Emergency Department (HOSPITAL_BASED_OUTPATIENT_CLINIC_OR_DEPARTMENT_OTHER)
Admission: EM | Admit: 2022-01-25 | Discharge: 2022-01-25 | Disposition: A | Discharge status: Home / Self Care | Payer: BC Managed Care – PPO | Attending: Emergency Medicine | Admitting: Emergency Medicine

## 2022-01-25 DIAGNOSIS — M542 Cervicalgia: Secondary | ICD-10-CM | POA: Diagnosis not present

## 2022-01-25 DIAGNOSIS — M545 Low back pain, unspecified: Secondary | ICD-10-CM | POA: Insufficient documentation

## 2022-01-25 DIAGNOSIS — R1011 Right upper quadrant pain: Secondary | ICD-10-CM | POA: Diagnosis not present

## 2022-01-25 DIAGNOSIS — M546 Pain in thoracic spine: Secondary | ICD-10-CM | POA: Diagnosis not present

## 2022-01-25 DIAGNOSIS — R079 Chest pain, unspecified: Secondary | ICD-10-CM | POA: Diagnosis not present

## 2022-01-25 DIAGNOSIS — M7918 Myalgia, other site: Secondary | ICD-10-CM | POA: Diagnosis not present

## 2022-01-25 DIAGNOSIS — R202 Paresthesia of skin: Secondary | ICD-10-CM | POA: Diagnosis not present

## 2022-01-25 DIAGNOSIS — R9431 Abnormal electrocardiogram [ECG] [EKG]: Secondary | ICD-10-CM | POA: Diagnosis not present

## 2022-01-25 DIAGNOSIS — G8929 Other chronic pain: Secondary | ICD-10-CM

## 2022-01-25 LAB — CBC WITH DIFFERENTIAL/PLATELET
Abs Immature Granulocytes: 0.01 10*3/uL (ref 0.00–0.07)
Basophils Absolute: 0 10*3/uL (ref 0.0–0.1)
Basophils Relative: 1 %
Eosinophils Absolute: 0.1 10*3/uL (ref 0.0–0.5)
Eosinophils Relative: 2 %
HCT: 34.8 % — ABNORMAL LOW (ref 36.0–46.0)
Hemoglobin: 12 g/dL (ref 12.0–15.0)
Immature Granulocytes: 0 %
Lymphocytes Relative: 41 %
Lymphs Abs: 2.5 10*3/uL (ref 0.7–4.0)
MCH: 32.1 pg (ref 26.0–34.0)
MCHC: 34.5 g/dL (ref 30.0–36.0)
MCV: 93 fL (ref 80.0–100.0)
Monocytes Absolute: 0.5 10*3/uL (ref 0.1–1.0)
Monocytes Relative: 8 %
Neutro Abs: 3 10*3/uL (ref 1.7–7.7)
Neutrophils Relative %: 48 %
Platelets: 252 10*3/uL (ref 150–400)
RBC: 3.74 MIL/uL — ABNORMAL LOW (ref 3.87–5.11)
RDW: 12.5 % (ref 11.5–15.5)
WBC: 6.1 10*3/uL (ref 4.0–10.5)
nRBC: 0 % (ref 0.0–0.2)

## 2022-01-25 LAB — COMPREHENSIVE METABOLIC PANEL
ALT: 21 U/L (ref 0–44)
AST: 25 U/L (ref 15–41)
Albumin: 3.7 g/dL (ref 3.5–5.0)
Alkaline Phosphatase: 90 U/L (ref 38–126)
Anion gap: 7 (ref 5–15)
BUN: 9 mg/dL (ref 6–20)
CO2: 27 mmol/L (ref 22–32)
Calcium: 9.4 mg/dL (ref 8.9–10.3)
Chloride: 106 mmol/L (ref 98–111)
Creatinine, Ser: 0.55 mg/dL (ref 0.44–1.00)
GFR, Estimated: >60 mL/min (ref >60)
Glucose, Bld: 93 mg/dL (ref 70–99)
Potassium: 3.4 mmol/L — ABNORMAL LOW (ref 3.5–5.1)
Sodium: 140 mmol/L (ref 135–145)
Total Bilirubin: 0.4 mg/dL (ref 0.3–1.2)
Total Protein: 7.5 g/dL (ref 6.5–8.1)

## 2022-01-25 LAB — TROPONIN I (HIGH SENSITIVITY): Troponin I (High Sensitivity): <2 ng/L (ref <18)

## 2022-01-25 LAB — LIPASE, BLOOD: Lipase: 22 U/L (ref 11–51)

## 2022-01-25 MED ORDER — CYCLOBENZAPRINE HCL 10 MG PO TABS: 10.0000 mg | ORAL_TABLET | Freq: Every evening | ORAL | 0 refills | Status: DC | PRN

## 2022-01-25 MED ORDER — ACETAMINOPHEN 500 MG PO TABS
1000.0000 mg | ORAL_TABLET | Freq: Once | ORAL | Status: AC
  Administered 2022-01-25: 1000 mg via ORAL
  Filled 2022-01-25: qty 2

## 2022-01-25 MED ORDER — ALUM & MAG HYDROXIDE-SIMETH 200-200-20 MG/5ML PO SUSP
30.0000 mL | Freq: Once | ORAL | Status: AC
  Administered 2022-01-25: 30 mL via ORAL
  Filled 2022-01-25: qty 30

## 2022-01-25 MED ORDER — FAMOTIDINE 20 MG PO TABS
20.0000 mg | ORAL_TABLET | Freq: Once | ORAL | Status: AC
  Administered 2022-01-25: 20 mg via ORAL
  Filled 2022-01-25: qty 1

## 2022-01-25 MED ORDER — KETOROLAC TROMETHAMINE 15 MG/ML IJ SOLN
15.0000 mg | Freq: Once | INTRAMUSCULAR | Status: AC
  Administered 2022-01-25: 15 mg via INTRAMUSCULAR
  Filled 2022-01-25: qty 1

## 2022-01-25 NOTE — Progress Notes (Signed)
[name] - 51 y.o. female MRN [id number]  Date of birth: 1970-09-28  Office Visit Note: Visit Date: 01/25/2022 PCP: [name], DO Referred by: [name], *  Subjective: Chief Complaint  Patient presents with   Neck - Pain   Right Hand - Pain   HPI: Makayla Good is a 51 y.o. female who comes in today for evaluation of chronic, worsening and severe bilateral neck pain. Also reports intermittent numbness/tingling to bilateral forearms radiating down to fingers. Pain ongoing for several months and is exacerbated by movement and activity.  She describes her pain as a cramping, sore and tight sensation, currently rates as 8 out of 10.  Patient reports difficulty sleeping at night due to severe pain.  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 Some relief of pain with home exercise regimen, heating pad, rest and use of Tylenol as needed. She did attend formal physical therapy with our in house team earlier this year where did did undergo dry needling, states some relief of pain with these treatments. She was previously seen by Erskine Emery, PA-C for evaluation of left arm numbness and tingling. Patient had bilateral upper extremity NCV with EMG on 01/10/2021 that exhibits evidence of a mild right median nerve entrapment at the wrist (carpal tunnel syndrome) affecting sensory components. There is no significant electrodiagnostic evidence of any other focal nerve entrapment, brachial plexopathy or cervical radiculopathy in either upper limb. Patient denies focal weakness. Patient denies recent trauma or falls.   Incidentally, patient reports right sided thoracic back pain ongoing for several weeks. Pain worsens with movement and activity, describes as tender and sore, currently rates as 8 out of 10. Some relief of pain with stretching exercises and Tylenol as needed. Patient was  recently seen by her primary care provider Dr. Betty Martinique for same issue. Per Dr. Doug Sou office notes she feels this is more of a myofascial related pain.    Review of Systems  Musculoskeletal:  Positive for back pain, myalgias and neck pain.  Neurological:  Positive for tingling. Negative for sensory change, focal weakness and weakness.  All other systems reviewed and are negative.  Otherwise per HPI.  Assessment & Plan: Visit Diagnoses:    ICD-10-CM   1. Cervicalgia  M54.2 MR CERVICAL SPINE WO CONTRAST    Ambulatory referral to Physical Therapy    2. Paresthesia of skin  R20.2 Ambulatory referral to Physical Therapy    3. Chronic right-sided thoracic back pain  M54.6 Ambulatory referral to Physical Therapy   G89.29     4. Myofascial pain syndrome  M79.18 Ambulatory referral to Physical Therapy       Plan: Findings:  1. Chronic, worsening and severe bilateral neck pain.  Patient continues to have severe pain despite good conservative therapy such as formal physical therapy, home exercise regimen, rest and use of medications.  Patient's clinical presentation and exam are complex, symptoms could be related to cervical radiculopathy as she does have paresthesias to bilateral upper extremities, this could also be facet mediated pain vs myofascial pain. I also feel there could be a type of central sensitization syndrome such as fibromyalgia contributing to her symptoms.  Next step is to obtain cervical MRI imaging, we will have patient follow-up for review after imaging is obtained. I also prescribed Flexeril to take as needed at bedtime for muscle spasms.  Depending on  cervical MRI imaging we did discuss the possibility of performing cervical injection.  2. Chronic, worsening and severe numbness/tingling to bilateral upper extremities. Patient continues to have intermittent paraesthesias to bilateral forearms, hands and fingers. We feel this could be directly related to carpal tunnel  syndrome, however we cannot rule out cervical radiculopathy at this time. If cervical pathology is negative we do recommend follow up with Erskine Emery, PA to discuss further treatment/possible surgical intervention.  3. Chronic, worsening and severe right sided thoracic back pain. Patient continues to have severe pain despite continued conservative therapies.  Patient's clinical presentation and exam are consistent with myofascial pain syndrome.  There are multiple palpable trigger points noted to right thoracic paraspinal region upon exam today.  Patient is extremely tender to touch upon palpation. Next step is to have patient re-group with our in house physical therapy team with a focus on manual treatments and dry needling.     Meds & Orders:  Meds ordered this encounter  Medications   cyclobenzaprine (FLEXERIL) 10 MG tablet    Sig: Take 1 tablet (10 mg total) by mouth at bedtime as needed for muscle spasms.    Dispense:  30 tablet    Refill:  0    Orders Placed This Encounter  Procedures   MR CERVICAL SPINE WO CONTRAST   Ambulatory referral to Physical Therapy    Follow-up: Return for re-evaluation after cervical MRI imaging is obtained.   Procedures: No procedures performed      Clinical History: No specialty comments available.   She reports that she has been smoking cigarettes and e-cigarettes. She started smoking about 20 months ago. She has a 15.00 pack-year smoking history. She has never used smokeless tobacco. No results for input(s): "HGBA1C", "LABURIC" in the last 8760 hours.  Objective:  VS:  HT:    WT:   BMI:     BP:   HR: bpm  TEMP: ( )  RESP:  Physical Exam Vitals reviewed.  HENT:     Head: Normocephalic and atraumatic.     Right Ear: External ear normal.     Left Ear: External ear normal.     Nose: Nose normal.     Mouth/Throat:     Mouth: Mucous membranes are moist.  Eyes:     Extraocular Movements: Extraocular movements intact.  Cardiovascular:      Rate and Rhythm: Normal rate.     Pulses: Normal pulses.  Pulmonary:     Effort: Pulmonary effort is normal.  Abdominal:     General: Abdomen is flat. There is no distension.  Musculoskeletal:        General: Tenderness present.     Cervical back: Tenderness present.     Comments: No discomfort noted with flexion, extension and side-to-side rotation. Patient has good strength in the upper extremities including 5 out of 5 strength in wrist extension, long finger flexion and APB.  There is no atrophy of the hands intrinsically.  Sensation intact bilaterally. Negative Hoffman's sign.  Multiple palpable trigger points noted to right thoracic paraspinal region upon palpation today.   Skin:    General: Skin is warm and dry.     Capillary Refill: Capillary refill takes less than 2 seconds.  Neurological:     General: No focal deficit present.     Mental Status: She is alert and oriented to person, place, and time.  Psychiatric:        Mood and Affect: Mood normal.  Behavior: Behavior normal.     Ortho Exam  Imaging: No results found.  Past Medical/Family/Surgical/Social History: Medications & Allergies reviewed per EMR, new medications updated. Patient Active Problem List   Diagnosis Date Noted   Varicose veins of right lower extremity with pain 12/08/2021   Multinodular goiter 10/25/2020   Anxiety 10/14/2020   Increased frequency of headaches 09/05/2020   Abnormal CT of the head 09/05/2020   Loud snoring 09/05/2020   Gastroesophageal reflux disease 05/05/2020   Hyperglycemia 05/05/2020   Lower extremity edema 09/22/2019   Lymphedema 09/22/2019   Primary hypertension 08/05/2017   Hypokalemia 08/05/2017   OSA (obstructive sleep apnea) 03/05/2017   Asthma 03/05/2017   Dizziness 02/08/2017   Preventative health care 02/08/2017   Abdominal pain 07/05/2016   RUQ pain 07/05/2016   Irregular periods 07/26/2014   Dysuria 02/04/2014   Severe obesity (BMI >= 40) (HCC)  10/21/2012   OA (osteoarthritis) of knee 06/04/2012   Cough 03/05/2012   Plantar fasciitis 09/04/2010   HYPOKALEMIA 07/01/2009   CHEST PAIN UNSPECIFIED 05/18/2009   DEPRESSION 03/07/2009   FATIGUE 03/07/2009   TOBACCO USER 12/03/2008   Morbid obesity ([place]) 05/21/2008   Hyperlipidemia LDL goal <100 82/42/3536   DYSMETABOLIC SYNDROME X 14/43/1540   CONJUNCTIVITIS NOS 02/04/2007   VAGINITIS NOS 11/07/2006   FUNGAL DERMATITIS 10/09/2006   Benign essential HTN 10/09/2006   Past Medical History:  Diagnosis Date   Asthma    Eczema    Fibroid    H/O oophorectomy    Hypertension    Obesity    Polycystic ovary    Left   Sleep apnea    no CPAP per MD   Family History  Problem Relation Age of Onset   Hypertension Mother    Schizophrenia Mother    Dementia Mother    Mental illness Mother        schizophrenia, dementia   Hypertension Father    Coronary artery disease Father        Stent   Alzheimer's disease Father    Hypertension Sister    Hypertension Sister    Arthritis Sister        rheumatoid   Dementia Maternal Aunt    Kidney disease Other    Colon cancer Neg Hx    Stomach cancer Neg Hx    Pancreatic cancer Neg Hx    Esophageal cancer Neg Hx    Rectal cancer Neg Hx    Breast cancer Neg Hx    Past Surgical History:  Procedure Laterality Date   BACK SURGERY     16yr ago   BREAST BIOPSY Right 2019   fibroadenoma   CESAREAN SECTION  02/01/2005   CESAREAN SECTION  2001   LAPAROSCOPIC GASTRIC SLEEVE RESECTION N/A 07/29/2017   Procedure: LAPAROSCOPIC GASTRIC SLEEVE RESECTION WITH UPPER ENDO ;  Surgeon: CClovis Riley MD;  Location: [facility];  Service: General;  Laterality: N/A;   OOPHORECTOMY     Rt.removed   TUBAL LIGATION  02/01/2005   Social History   Occupational History   Occupation: release liens    Employer: [employer]  Tobacco Use   Smoking status: Some Days    Packs/day: 0.50    Years: 30.00    Total pack years: 15.00    Types: Cigarettes,  E-cigarettes    Start date: 04/28/2020   Smokeless tobacco: Never   Tobacco comments:    VAPES  Vaping Use   Vaping Use: Some days  Substance and Sexual  Activity   Alcohol use: Yes    Comment: occasional   Drug use: No   Sexual activity: Not Currently    Partners: Male    Birth control/protection: Surgical    Comment: Tubal/been with same person over 20 years

## 2022-01-25 NOTE — ED Provider Notes (Signed)
[facility] EMERGENCY DEPARTMENT Provider Note   CSN: [id number] Arrival date & time: 01/25/22  1758     History Chief Complaint  Patient presents with   Back Pain    HPI Makayla Good is a 51 y.o. female presenting for chief complaint of epigastric radiating to back pain.  She has a minimal medical history however she endorses 2 weeks of worsening gastroesophageal reflux disease and right upper quadrant pain.  She states she went to have her gallbladder evaluated she was worried she was having biliary disease.  No history similar.  She denies fevers or chills, nausea or vomiting, syncope or shortness of breath.  Patient is otherwise ambulatory tolerating p.o. intake.  Currently, patient unfortunately had a 5-hour wait prior to being seen and has had near complete resolution of her symptoms.   Patient's recorded medical, surgical, social, medication list and allergies were reviewed in the Snapshot window as part of the initial history.   Review of Systems   Review of Systems  Constitutional:  Negative for chills and fever.  HENT:  Negative for ear pain and sore throat.   Eyes:  Negative for pain and visual disturbance.  Respiratory:  Negative for cough and shortness of breath.   Cardiovascular:  Negative for chest pain and palpitations.  Gastrointestinal:  Positive for abdominal pain. Negative for vomiting.  Genitourinary:  Negative for dysuria and hematuria.  Musculoskeletal:  Negative for arthralgias and back pain.  Skin:  Negative for color change and rash.  Neurological:  Negative for seizures and syncope.  All other systems reviewed and are negative.   Physical Exam Updated Vital Signs BP (!) 155/102 (BP Location: Left Arm)   Pulse 79   Temp 98.9 F (37.2 C) (Oral)   Resp 16   Ht '5\' 5"'$  (1.651 m)   Wt 93 kg   LMP 07/27/2020 (Within Days)   SpO2 100%   BMI 34.12 kg/m  Physical Exam Vitals and nursing note reviewed.  Constitutional:       General: She is not in acute distress.    Appearance: She is well-developed.  HENT:     Head: Normocephalic and atraumatic.  Eyes:     Conjunctiva/sclera: Conjunctivae normal.  Cardiovascular:     Rate and Rhythm: Normal rate and regular rhythm.     Heart sounds: No murmur heard. Pulmonary:     Effort: Pulmonary effort is normal. No respiratory distress.     Breath sounds: Normal breath sounds.  Abdominal:     Palpations: Abdomen is soft.     Tenderness: There is abdominal tenderness.  Musculoskeletal:        General: No swelling.     Cervical back: Neck supple.  Skin:    General: Skin is warm and dry.     Capillary Refill: Capillary refill takes less than 2 seconds.  Neurological:     Mental Status: She is alert.  Psychiatric:        Mood and Affect: Mood normal.      ED Course/ Medical Decision Making/ A&P    Procedures Procedures   Medications Ordered in ED Medications  famotidine (PEPCID) tablet 20 mg (has no administration in time range)  acetaminophen (TYLENOL) tablet 1,000 mg (has no administration in time range)  ketorolac (TORADOL) 15 MG/ML injection 15 mg (has no administration in time range)  alum & mag hydroxide-simeth (MAALOX/MYLANTA) 200-200-20 MG/5ML suspension 30 mL (has no administration in time range)    Medical Decision Making:  Makayla Good is a 51 y.o. female who presented to the ED today with abdominal pain detailed above.     Patient's presentation is complicated by their history of Elevated BMI, gastroesophageal reflux disease, hyperlipidemia on chronic outpatient medications.  Patient placed on continuous vitals and telemetry monitoring while in ED which was reviewed periodically.   Complete initial physical exam performed, notably the patient  was hemodynamically stable in no acute distress.  She has no residual pain at this time.  Otherwise ambulatory tolerating p.o. intake.      Reviewed and confirmed nursing  documentation for past medical history, family history, social history.    Initial Assessment:   With the patient's presentation of epigastric to right upper quadrant abdominal pain, most likely diagnosis is biliary colic versus pancreatitis versus peptic ulcer disease. Other diagnoses were considered including (but not limited to) cholecystitis, nephrolithiasis, aortic dissection, ACS, pulmonary embolism. These are considered less likely due to history of present illness and physical exam findings.   This is most consistent with an acute life/limb threatening illness complicated by underlying chronic conditions.  Initial Plan:  Screening labs including CBC and Metabolic panel to evaluate for infectious or metabolic etiology of disease.  Lipase to evaluate for evidence of pancreatitis CXR to evaluate for structural/infectious intrathoracic pathology.  Troponin and EKG to evaluate for cardiac pathology. Right upper quadrant ultrasound to evaluate for structural biliary abnormality Objective evaluation as below reviewed with plan for close reassessment  Initial Study Results:   Laboratory  All laboratory results reviewed without evidence of clinically relevant pathology.     EKG EKG was reviewed independently. Rate, rhythm, axis, intervals all examined and without medically relevant abnormality. ST segments without concerns for elevations.    Radiology  All images reviewed independently. Agree with radiology report at this time.   US Abdomen Limited RUQ (LIVER/GB)  Result Date: 01/25/2022 CLINICAL DATA:  Right upper quadrant pain. EXAM: ULTRASOUND ABDOMEN LIMITED RIGHT UPPER QUADRANT COMPARISON:  Abdominal ultrasound 05/06/2020 FINDINGS: Gallbladder: No gallstones or wall thickening visualized. No sonographic Murphy sign noted by sonographer. Common bile duct: Diameter: 6.2 mm Liver: No focal lesion identified. Diffusely heterogeneous with increased echogenicity. Portal vein is patent on  color Doppler imaging with normal direction of blood flow towards the liver. Other: None. IMPRESSION: 1. The common bile duct is mildly dilated measuring 6.2 mm. Recommend correlation with LFTs. If abnormal, recommend further evaluation with MRCP or ERCP. 2. The liver is diffusely heterogeneous with increased echogenicity. This is a nonspecific finding but can be seen with hepatic steatosis and/or other hepatocellular disease. Electronically Signed   By: Ronney Asters M.D.   On: 01/25/2022 19:31   DG Chest 2 View  Result Date: 01/25/2022 CLINICAL DATA:  Chest pain for 1 week, initial encounter EXAM: CHEST - 2 VIEW COMPARISON:  12/19/2020 FINDINGS: Cardiac shadow is within normal limits. Lungs are well aerated bilaterally. No focal infiltrate or effusion is seen. No bony abnormality is noted. IMPRESSION: No active cardiopulmonary disease. Electronically Signed   By: Inez Catalina M.D.   On: 01/25/2022 18:54     Final Assessment and Plan:   On repeat assessment, patient is overall well-appearing.  Symptoms remain resolved.  Labs grossly reassuring.  Ordered trial of symptomatic management for suspected peptic ulcer disease and recommended that patient follow-up close with primary care provider.  She has already been prescribed omeprazole and muscle relaxer medications that she has not picked these up yet.  She plans to start this  in the morning.  Will start symptomatic management tonight with Pepcid and anti-inflammatories as well as Maalox. Patient in no acute distress at this time stable for outpatient care management plan to follow-up with PCP in the outpatient setting.    Clinical Impression:  1. Right upper quadrant abdominal pain      Discharge   Final Clinical Impression(s) / ED Diagnoses Final diagnoses:  Right upper quadrant abdominal pain    Rx / DC Orders ED Discharge Orders     None         Tretha Sciara, MD 01/25/22 2316

## 2022-01-25 NOTE — ED Triage Notes (Signed)
Constant right thoracic back pain that wraps to right upper abdomen x 1 week. Describes pain as a soreness, and pressure in RUQ when bending over. Coughing worsens pain. Denies injury. Seen by ortho today and prescribed muscle relaxer. Pt states she thinks it not muscle related.

## 2022-01-25 NOTE — Progress Notes (Signed)
Patient here for consultation of right side pain, neck pain and right UE pain with UE numbness and tingling.

## 2022-01-29 ENCOUNTER — Other Ambulatory Visit: Payer: BC Managed Care – PPO

## 2022-01-30 ENCOUNTER — Encounter: Payer: Self-pay | Admitting: Family Medicine

## 2022-01-30 ENCOUNTER — Ambulatory Visit: Payer: BC Managed Care – PPO | Admitting: Family Medicine

## 2022-01-30 VITALS — BP 122/85 | HR 96 | Temp 98.9°F | Ht 65.0 in | Wt 202.4 lb

## 2022-01-30 DIAGNOSIS — M546 Pain in thoracic spine: Secondary | ICD-10-CM | POA: Diagnosis not present

## 2022-01-30 DIAGNOSIS — E876 Hypokalemia: Secondary | ICD-10-CM | POA: Diagnosis not present

## 2022-01-30 DIAGNOSIS — D649 Anemia, unspecified: Secondary | ICD-10-CM | POA: Diagnosis not present

## 2022-01-30 NOTE — Patient Instructions (Addendum)
Give Makayla Good 2-3 business days to get the results of your labs back.   Heat (pad or rice pillow in microwave) over affected area, 10-15 minutes twice daily.   Ice/cold pack over area for 10-15 min twice daily.  OK to take Tylenol 1000 mg (2 extra strength tabs) or 975 mg (3 regular strength tabs) every 6 hours as needed.  Let Makayla Good know if you need anything.  EXERCISES  RANGE OF MOTION (ROM) AND STRETCHING EXERCISES - Low Back Pain Most people with lower back pain will find that their symptoms get worse with excessive bending forward (flexion) or arching at the lower back (extension). The exercises that will help resolve your symptoms will focus on the opposite motion.  If you have pain, numbness or tingling which travels down into your buttocks, leg or foot, the goal of the therapy is for these symptoms to move closer to your back and eventually resolve. Sometimes, these leg symptoms will get better, but your lower back pain may worsen. This is often an indication of progress in your rehabilitation. Be very alert to any changes in your symptoms and the activities in which you participated in the 24 hours prior to the change. Sharing this information with your caregiver will allow him or her to most efficiently treat your condition. These exercises may help you when beginning to rehabilitate your injury. Your symptoms may resolve with or without further involvement from your physician, physical therapist or athletic trainer. While completing these exercises, remember:  Restoring tissue flexibility helps normal motion to return to the joints. This allows healthier, less painful movement and activity. An effective stretch should be held for at least 30 seconds. A stretch should never be painful. You should only feel a gentle lengthening or release in the stretched tissue. FLEXION RANGE OF MOTION AND STRETCHING EXERCISES:  STRETCH - Flexion, Single Knee to Chest  Lie on a firm bed or floor with both legs  extended in front of you. Keeping one leg in contact with the floor, bring your opposite knee to your chest. Hold your leg in place by either grabbing behind your thigh or at your knee. Pull until you feel a gentle stretch in your low back. Hold 30 seconds. Slowly release your grasp and repeat the exercise with the opposite side. Repeat 2 times. Complete this exercise 3 times per week.   STRETCH - Flexion, Double Knee to Chest Lie on a firm bed or floor with both legs extended in front of you. Keeping one leg in contact with the floor, bring your opposite knee to your chest. Tense your stomach muscles to support your back and then lift your other knee to your chest. Hold your legs in place by either grabbing behind your thighs or at your knees. Pull both knees toward your chest until you feel a gentle stretch in your low back. Hold 30 seconds. Tense your stomach muscles and slowly return one leg at a time to the floor. Repeat 2 times. Complete this exercise 3 times per week.   STRETCH - Low Trunk Rotation Lie on a firm bed or floor. Keeping your legs in front of you, bend your knees so they are both pointed toward the ceiling and your feet are flat on the floor. Extend your arms out to the side. This will stabilize your upper body by keeping your shoulders in contact with the floor. Gently and slowly drop both knees together to one side until you feel a gentle stretch in your  low back. Hold for 30 seconds. Tense your stomach muscles to support your lower back as you bring your knees back to the starting position. Repeat the exercise to the other side. Repeat 2 times. Complete this exercise at least 3 times per week.   EXTENSION RANGE OF MOTION AND FLEXIBILITY EXERCISES:  STRETCH - Extension, Prone on Elbows  Lie on your stomach on the floor, a bed will be too soft. Place your palms about shoulder width apart and at the height of your head. Place your elbows under your shoulders. If this is  too painful, stack pillows under your chest. Allow your body to relax so that your hips drop lower and make contact more completely with the floor. Hold this position for 30 seconds. Slowly return to lying flat on the floor. Repeat 2 times. Complete this exercise 3 times per week.   RANGE OF MOTION - Extension, Prone Press Ups Lie on your stomach on the floor, a bed will be too soft. Place your palms about shoulder width apart and at the height of your head. Keeping your back as relaxed as possible, slowly straighten your elbows while keeping your hips on the floor. You may adjust the placement of your hands to maximize your comfort. As you gain motion, your hands will come more underneath your shoulders. Hold this position 30 seconds. Slowly return to lying flat on the floor. Repeat 2 times. Complete this exercise 3 times per week.   RANGE OF MOTION- Quadruped, Neutral Spine  Assume a hands and knees position on a firm surface. Keep your hands under your shoulders and your knees under your hips. You may place padding under your knees for comfort. Drop your head and point your tailbone toward the ground below you. This will round out your lower back like an angry cat. Hold this position for 30 seconds. Slowly lift your head and release your tail bone so that your back sags into a large arch, like an old horse. Hold this position for 30 seconds. Repeat this until you feel limber in your low back. Now, find your "sweet spot." This will be the most comfortable position somewhere between the two previous positions. This is your neutral spine. Once you have found this position, tense your stomach muscles to support your low back. Hold this position for 30 seconds. Repeat 2 times. Complete this exercise 3 times per week.   STRENGTHENING EXERCISES - Low Back Sprain These exercises may help you when beginning to rehabilitate your injury. These exercises should be done near your "sweet spot." This is  the neutral, low-back arch, somewhere between fully rounded and fully arched, that is your least painful position. When performed in this safe range of motion, these exercises can be used for people who have either a flexion or extension based injury. These exercises may resolve your symptoms with or without further involvement from your physician, physical therapist or athletic trainer. While completing these exercises, remember:  Muscles can gain both the endurance and the strength needed for everyday activities through controlled exercises. Complete these exercises as instructed by your physician, physical therapist or athletic trainer. Increase the resistance and repetitions only as guided. You may experience muscle soreness or fatigue, but the pain or discomfort you are trying to eliminate should never worsen during these exercises. If this pain does worsen, stop and make certain you are following the directions exactly. If the pain is still present after adjustments, discontinue the exercise until you can discuss the trouble  with your caregiver.  STRENGTHENING - Deep Abdominals, Pelvic Tilt  Lie on a firm bed or floor. Keeping your legs in front of you, bend your knees so they are both pointed toward the ceiling and your feet are flat on the floor. Tense your lower abdominal muscles to press your low back into the floor. This motion will rotate your pelvis so that your tail bone is scooping upwards rather than pointing at your feet or into the floor. With a gentle tension and even breathing, hold this position for 3 seconds. Repeat 2 times. Complete this exercise 3 times per week.   STRENGTHENING - Abdominals, Crunches  Lie on a firm bed or floor. Keeping your legs in front of you, bend your knees so they are both pointed toward the ceiling and your feet are flat on the floor. Cross your arms over your chest. Slightly tip your chin down without bending your neck. Tense your abdominals and slowly  lift your trunk high enough to just clear your shoulder blades. Lifting higher can put excessive stress on the lower back and does not further strengthen your abdominal muscles. Control your return to the starting position. Repeat 2 times. Complete this exercise 3 times per week.   STRENGTHENING - Quadruped, Opposite UE/LE Lift  Assume a hands and knees position on a firm surface. Keep your hands under your shoulders and your knees under your hips. You may place padding under your knees for comfort. Find your neutral spine and gently tense your abdominal muscles so that you can maintain this position. Your shoulders and hips should form a rectangle that is parallel with the floor and is not twisted. Keeping your trunk steady, lift your right hand no higher than your shoulder and then your left leg no higher than your hip. Make sure you are not holding your breath. Hold this position for 30 seconds. Continuing to keep your abdominal muscles tense and your back steady, slowly return to your starting position. Repeat with the opposite arm and leg. Repeat 2 times. Complete this exercise 3 times per week.   STRENGTHENING - Abdominals and Quadriceps, Straight Leg Raise  Lie on a firm bed or floor with both legs extended in front of you. Keeping one leg in contact with the floor, bend the other knee so that your foot can rest flat on the floor. Find your neutral spine, and tense your abdominal muscles to maintain your spinal position throughout the exercise. Slowly lift your straight leg off the floor about 6 inches for a count of 3, making sure to not hold your breath. Still keeping your neutral spine, slowly lower your leg all the way to the floor. Repeat this exercise with each leg 2 times. Complete this exercise 3 times per week.  POSTURE AND BODY MECHANICS CONSIDERATIONS - Low Back Sprain Keeping correct posture when sitting, standing or completing your activities will reduce the stress put on  different body tissues, allowing injured tissues a chance to heal and limiting painful experiences. The following are general guidelines for improved posture.  While reading these guidelines, remember: The exercises prescribed by your provider will help you have the flexibility and strength to maintain correct postures. The correct posture provides the best environment for your joints to work. All of your joints have less wear and tear when properly supported by a spine with good posture. This means you will experience a healthier, less painful body. Correct posture must be practiced with all of your activities, especially prolonged  sitting and standing. Correct posture is as important when doing repetitive low-stress activities (typing) as it is when doing a single heavy-load activity (lifting).  RESTING POSITIONS Consider which positions are most painful for you when choosing a resting position. If you have pain with flexion-based activities (sitting, bending, stooping, squatting), choose a position that allows you to rest in a less flexed posture. You would want to avoid curling into a fetal position on your side. If your pain worsens with extension-based activities (prolonged standing, working overhead), avoid resting in an extended position such as sleeping on your stomach. Most people will find more comfort when they rest with their spine in a more neutral position, neither too rounded nor too arched. Lying on a non-sagging bed on your side with a pillow between your knees, or on your back with a pillow under your knees will often provide some relief. Keep in mind, being in any one position for a prolonged period of time, no matter how correct your posture, can still lead to stiffness.  PROPER SITTING POSTURE In order to minimize stress and discomfort on your spine, you must sit with correct posture. Sitting with good posture should be effortless for a healthy body. Returning to good posture is a  gradual process. Many people can work toward this most comfortably by using various supports until they have the flexibility and strength to maintain this posture on their own. When sitting with proper posture, your ears will fall over your shoulders and your shoulders will fall over your hips. You should use the back of the chair to support your upper back. Your lower back will be in a neutral position, just slightly arched. You may place a small pillow or folded towel at the base of your lower back for  support.  When working at a desk, create an environment that supports good, upright posture. Without extra support, muscles tire, which leads to excessive strain on joints and other tissues. Keep these recommendations in mind:  CHAIR: A chair should be able to slide under your desk when your back makes contact with the back of the chair. This allows you to work closely. The chair's height should allow your eyes to be level with the upper part of your monitor and your hands to be slightly lower than your elbows.  BODY POSITION Your feet should make contact with the floor. If this is not possible, use a foot rest. Keep your ears over your shoulders. This will reduce stress on your neck and low back.  INCORRECT SITTING POSTURES  If you are feeling tired and unable to assume a healthy sitting posture, do not slouch or slump. This puts excessive strain on your back tissues, causing more damage and pain. Healthier options include: Using more support, like a lumbar pillow. Switching tasks to something that requires you to be upright or walking. Talking a brief walk. Lying down to rest in a neutral-spine position.  PROLONGED STANDING WHILE SLIGHTLY LEANING FORWARD  When completing a task that requires you to lean forward while standing in one place for a long time, place either foot up on a stationary 2-4 inch high object to help maintain the best posture. When both feet are on the ground, the lower  back tends to lose its slight inward curve. If this curve flattens (or becomes too large), then the back and your other joints will experience too much stress, tire more quickly, and can cause pain.  CORRECT STANDING POSTURES Proper standing posture should  be assumed with all daily activities, even if they only take a few moments, like when brushing your teeth. As in sitting, your ears should fall over your shoulders and your shoulders should fall over your hips. You should keep a slight tension in your abdominal muscles to brace your spine. Your tailbone should point down to the ground, not behind your body, resulting in an over-extended swayback posture.   INCORRECT STANDING POSTURES  Common incorrect standing postures include a forward head, locked knees and/or an excessive swayback. WALKING Walk with an upright posture. Your ears, shoulders and hips should all line-up.  PROLONGED ACTIVITY IN A FLEXED POSITION When completing a task that requires you to bend forward at your waist or lean over a low surface, try to find a way to stabilize 3 out of 4 of your limbs. You can place a hand or elbow on your thigh or rest a knee on the surface you are reaching across. This will provide you more stability, so that your muscles do not tire as quickly. By keeping your knees relaxed, or slightly bent, you will also reduce stress across your lower back. CORRECT LIFTING TECHNIQUES  DO : Assume a wide stance. This will provide you more stability and the opportunity to get as close as possible to the object which you are lifting. Tense your abdominals to brace your spine. Bend at the knees and hips. Keeping your back locked in a neutral-spine position, lift using your leg muscles. Lift with your legs, keeping your back straight. Test the weight of unknown objects before attempting to lift them. Try to keep your elbows locked down at your sides in order get the best strength from your shoulders when carrying an  object.   Always ask for help when lifting heavy or awkward objects. INCORRECT LIFTING TECHNIQUES DO NOT:  Lock your knees when lifting, even if it is a small object. Bend and twist. Pivot at your feet or move your feet when needing to change directions. Assume that you can safely pick up even a paperclip without proper posture.

## 2022-01-30 NOTE — Progress Notes (Signed)
Chief Complaint  Patient presents with   Follow-up    ED  Right side pain     Subjective: Patient is a 51 y.o. female here for R side pain.  Has improved since it started over the past 2 weeks. CXR unremarkable. Described as an ache. Radiates to the back region. Has taken a muscle relaxer, Tylenol, and omeprazole with minimal relief. +constipation. Nothing obvious makes it better or worse. No trauma, rashes, fevers, N/V/D.   Past Medical History:  Diagnosis Date   Asthma    Eczema    Fibroid    H/O oophorectomy    Hypertension    Obesity    Polycystic ovary    Left   Sleep apnea    no CPAP per MD    Objective: BP 122/85 (BP Location: Left Arm, Patient Position: Sitting, Cuff Size: Normal)   Pulse 96   Temp 98.9 F (37.2 C) (Oral)   Ht '5\' 5"'$  (1.651 m)   Wt 202 lb 6 oz (91.8 kg)   LMP 07/27/2020 (Within Days)   SpO2 96%   BMI 33.68 kg/m  General: Awake, appears stated age Heart: RRR Lungs: CTAB, no rales, wheezes or rhonchi. No accessory muscle use Abd: BS+, NT, ND, S, neg Murphy's MSK: TTP over the lateral R ES group without edema or ecchymosis Skin: No rash on sdie Psych: Age appropriate judgment and insight, normal affect and mood  Assessment and Plan: Hypokalemia - Plan: Basic metabolic panel  Low hematocrit - Plan: CBC  Acute right-sided thoracic back pain  Follow up on above lab abnormalities. Ice, heat, Tylenol. Stretches/exercises. If no improvement, will set up w PT.  The patient voiced understanding and agreement to the plan.  [place], DO 01/30/22  3:40 PM

## 2022-01-31 LAB — CBC
HCT: 37.4 % (ref 36.0–46.0)
Hemoglobin: 12.4 g/dL (ref 12.0–15.0)
MCHC: 33.2 g/dL (ref 30.0–36.0)
MCV: 95 fl (ref 78.0–100.0)
Platelets: 273 10*3/uL (ref 150.0–400.0)
RBC: 3.93 Mil/uL (ref 3.87–5.11)
RDW: 12.8 % (ref 11.5–15.5)
WBC: 6.1 10*3/uL (ref 4.0–10.5)

## 2022-01-31 LAB — BASIC METABOLIC PANEL
BUN: 15 mg/dL (ref 6–23)
CO2: 27 mEq/L (ref 19–32)
Calcium: 9.8 mg/dL (ref 8.4–10.5)
Chloride: 102 mEq/L (ref 96–112)
Creatinine, Ser: 0.75 mg/dL (ref 0.40–1.20)
GFR: 92.3 mL/min (ref >60.00)
Glucose, Bld: 96 mg/dL (ref 70–99)
Potassium: 3.9 mEq/L (ref 3.5–5.1)
Sodium: 140 mEq/L (ref 135–145)

## 2022-02-05 ENCOUNTER — Ambulatory Visit: Payer: BC Managed Care – PPO | Admitting: Family Medicine

## 2022-02-05 ENCOUNTER — Encounter: Payer: Self-pay | Admitting: Family Medicine

## 2022-02-05 VITALS — BP 118/82 | HR 104 | Temp 99.4°F | Resp 18 | Ht 65.0 in | Wt 205.8 lb

## 2022-02-05 DIAGNOSIS — R1011 Right upper quadrant pain: Secondary | ICD-10-CM

## 2022-02-05 NOTE — Assessment & Plan Note (Signed)
[place] from er and labs reviewed  Check cmp again  Check CT abd and hida scan If pain worsens -- go to  ER

## 2022-02-05 NOTE — Patient Instructions (Signed)

## 2022-02-05 NOTE — Progress Notes (Addendum)
Subjective:   By signing my name below, I, [name], attest that this documentation has been prepared under the direction and in the presence of [name] DO 02/05/2022   Patient ID: Makayla Good, female    DOB: May 07, 1970, 51 y.o.   MRN: [id number]  Chief Complaint  Patient presents with   Bloated   Flank Pain   Follow-up    HPI Patient is in today for an office visit  She reports that her right side abdominal pain is persistent with occasional pain in her middle back. She states that the pain was terrible on the night of 02/04/2022 and caused her troubles with sleeping. She ate around 19:00 and about two hours later, could not get any relief. She states that soup or light yogurt does not irritate her symptoms. She reports that she has her gallbladder. She denies of worsening pain after her ED discharge.   Past Medical History:  Diagnosis Date   Asthma    Eczema    Fibroid    H/O oophorectomy    Hypertension    Obesity    Polycystic ovary    Left   Sleep apnea    no CPAP per MD    Past Surgical History:  Procedure Laterality Date   BACK SURGERY     42yr ago   BREAST BIOPSY Right 2019   fibroadenoma   CESAREAN SECTION  02/01/2005   CESAREAN SECTION  2001   LAPAROSCOPIC GASTRIC SLEEVE RESECTION N/A 07/29/2017   Procedure: LAPAROSCOPIC GASTRIC SLEEVE RESECTION WITH UPPER ENDO ;  Surgeon: CClovis Riley MD;  Location: [facility];  Service: General;  Laterality: N/A;   OOPHORECTOMY     Rt.removed   TUBAL LIGATION  02/01/2005    Family History  Problem Relation Age of Onset   Hypertension Mother    Schizophrenia Mother    Dementia Mother    Mental illness Mother        schizophrenia, dementia   Hypertension Father    Coronary artery disease Father        Stent   Alzheimer's disease Father    Hypertension Sister    Hypertension Sister    Arthritis Sister        rheumatoid   Dementia Maternal Aunt    Kidney disease Other     Colon cancer Neg Hx    Stomach cancer Neg Hx    Pancreatic cancer Neg Hx    Esophageal cancer Neg Hx    Rectal cancer Neg Hx    Breast cancer Neg Hx     Social History   Socioeconomic History   Marital status: Married    Spouse name: Not on file   Number of children: Not on file   Years of education: 13   Highest education level: Not on file  Occupational History   Occupation: release liens    Employer: [employer]  Tobacco Use   Smoking status: Some Days    Packs/day: 0.50    Years: 30.00    Total pack years: 15.00    Types: Cigarettes, E-cigarettes    Start date: 04/28/2020   Smokeless tobacco: Never   Tobacco comments:    VAPES  Vaping Use   Vaping Use: Some days  Substance and Sexual Activity   Alcohol use: Yes    Comment: occasional   Drug use: No   Sexual activity: Not Currently    Partners: Male    Birth control/protection: Surgical  Comment: Tubal/been with same person over 20 years  Other Topics Concern   Not on file  Social History Narrative   Exercise--gym, every other day--- at least 3 days a week   Social Determinants of Health   Financial Resource Strain: Not on file  Food Insecurity: Food Insecurity Present (03/07/2017)   Hunger Vital Sign    Worried About Running Out of Food in the Last Year: Sometimes true    Ran Out of Food in the Last Year: Never true  Transportation Needs: Not on file  Physical Activity: Not on file  Stress: Not on file  Social Connections: Not on file  Intimate Partner Violence: Not on file    Outpatient Medications Prior to Visit  Medication Sig Dispense Refill   amLODipine (NORVASC) 5 MG tablet TAKE 1 TABLET (5 MG TOTAL) BY MOUTH DAILY. 90 tablet 1   ASHWAGANDHA PO Take 1 tablet by mouth daily.     Biotin w/ Vitamins C & E (HAIR/SKIN/NAILS PO) Take 1 tablet by mouth daily.     CALCIUM PO Take by mouth.     cyclobenzaprine (FLEXERIL) 10 MG tablet Take 1 tablet (10 mg total) by mouth at bedtime as needed for  muscle spasms. 30 tablet 0   fluticasone (FLONASE) 50 MCG/ACT nasal spray SPRAY 2 SPRAYS INTO EACH NOSTRIL EVERY DAY 48 mL 2   hydrochlorothiazide (HYDRODIURIL) 25 MG tablet Take 1 tablet (25 mg total) by mouth daily. 90 tablet 1   Multiple Vitamins-Minerals (CELEBRATE MULTI-COMPLETE 36 PO) Take 1 each by mouth daily.     nicotine (NICODERM CQ - DOSED IN MG/24 HOURS) 14 mg/24hr patch Place 1 patch (14 mg total) onto the skin daily. 28 patch 1   nicotine (NICODERM CQ) 7 mg/24hr patch Place 1 patch (7 mg total) onto the skin daily. 28 patch 0   omeprazole (PRILOSEC) 40 MG capsule Take 1 capsule (40 mg total) by mouth daily. 30 capsule 0   potassium chloride (KLOR-CON) 10 MEQ tablet TAKE 1 TABLET BY MOUTH EVERY DAY 90 tablet 1   No facility-administered medications prior to visit.    No Known Allergies  Review of Systems  Constitutional:  Negative for fever and malaise/fatigue.  HENT:  Negative for congestion.   Eyes:  Negative for blurred vision.  Respiratory:  Negative for shortness of breath.   Cardiovascular:  Negative for chest pain, palpitations and leg swelling.  Gastrointestinal:  Negative for abdominal pain, blood in stool and nausea.  Genitourinary:  Negative for dysuria and frequency.  Musculoskeletal:  Negative for falls.  Skin:  Negative for rash.  Neurological:  Negative for dizziness, loss of consciousness and headaches.  Endo/Heme/Allergies:  Negative for environmental allergies.  Psychiatric/Behavioral:  Negative for depression. The patient is not nervous/anxious.        Objective:    Physical Exam Vitals and nursing note reviewed.  Constitutional:      General: She is not in acute distress.    Appearance: Normal appearance. She is not ill-appearing.  HENT:     Head: Normocephalic and atraumatic.     Right Ear: External ear normal.     Left Ear: External ear normal.  Eyes:     Extraocular Movements: Extraocular movements intact.     Pupils: Pupils are equal,  round, and reactive to light.  Cardiovascular:     Rate and Rhythm: Normal rate and regular rhythm.     Heart sounds: Normal heart sounds. No murmur heard.    No gallop.  Pulmonary:     Effort: Pulmonary effort is normal. No respiratory distress.     Breath sounds: Normal breath sounds. No wheezing or rales.  Abdominal:     General: Bowel sounds are normal. There is no distension.     Palpations: Abdomen is soft.     Tenderness: There is no abdominal tenderness. There is no guarding.  Skin:    General: Skin is warm and dry.  Neurological:     Mental Status: She is alert and oriented to person, place, and time.  Psychiatric:        Mood and Affect: Mood normal.        Behavior: Behavior normal.        Thought Content: Thought content normal.        Judgment: Judgment normal.     BP 118/82 (BP Location: Left Arm, Patient Position: Sitting, Cuff Size: Large)   Pulse (!) 104   Temp 99.4 F (37.4 C) (Oral)   Resp 18   Ht '5\' 5"'$  (1.651 m)   Wt 205 lb 12.8 oz (93.4 kg)   LMP 07/27/2020 (Within Days)   SpO2 99%   BMI 34.25 kg/m  Wt Readings from Last 3 Encounters:  02/05/22 205 lb 12.8 oz (93.4 kg)  01/30/22 202 lb 6 oz (91.8 kg)  01/25/22 205 lb 0.4 oz (93 kg)    Diabetic Foot Exam - Simple   No data filed    Lab Results  Component Value Date   WBC 6.1 01/30/2022   HGB 12.4 01/30/2022   HCT 37.4 01/30/2022   PLT 273.0 01/30/2022   GLUCOSE 96 01/30/2022   CHOL 186 12/08/2021   TRIG 102 12/08/2021   HDL 54 12/08/2021   LDLCALC 112 (H) 12/08/2021   ALT 21 01/25/2022   AST 25 01/25/2022   NA 140 01/30/2022   K 3.9 01/30/2022   CL 102 01/30/2022   CREATININE 0.75 01/30/2022   BUN 15 01/30/2022   CO2 27 01/30/2022   TSH 1.74 12/08/2021   HGBA1C 5.6 05/05/2020   MICROALBUR <0.7 01/23/2016    Lab Results  Component Value Date   TSH 1.74 12/08/2021   Lab Results  Component Value Date   WBC 6.1 01/30/2022   HGB 12.4 01/30/2022   HCT 37.4 01/30/2022   MCV  95.0 01/30/2022   PLT 273.0 01/30/2022   Lab Results  Component Value Date   NA 140 01/30/2022   K 3.9 01/30/2022   CO2 27 01/30/2022   GLUCOSE 96 01/30/2022   BUN 15 01/30/2022   CREATININE 0.75 01/30/2022   BILITOT 0.4 01/25/2022   ALKPHOS 90 01/25/2022   AST 25 01/25/2022   ALT 21 01/25/2022   PROT 7.5 01/25/2022   ALBUMIN 3.7 01/25/2022   CALCIUM 9.8 01/30/2022   ANIONGAP 7 01/25/2022   GFR 92.30 01/30/2022   Lab Results  Component Value Date   CHOL 186 12/08/2021   Lab Results  Component Value Date   HDL 54 12/08/2021   Lab Results  Component Value Date   LDLCALC 112 (H) 12/08/2021   Lab Results  Component Value Date   TRIG 102 12/08/2021   Lab Results  Component Value Date   CHOLHDL 3.4 12/08/2021   Lab Results  Component Value Date   HGBA1C 5.6 05/05/2020       Assessment & Plan:   Problem List Items Addressed This Visit       Unprioritized   Abdominal pain   Relevant Orders  CT Abdomen Pelvis W Contrast   NM Hepato W/Eject Fract   RUQ pain    [place] from er and labs reviewed  Check cmp again  Check CT abd and hida scan If pain worsens -- go to  ER       Other Visit Diagnoses     RUQ abdominal pain    -  Primary   Relevant Orders   Comprehensive metabolic panel   CBC with Differential/Platelet   CT Abdomen Pelvis W Contrast      No orders of the defined types were placed in this encounter.   IAnn Held, DO, personally preformed the services described in this documentation.  All medical record entries made by the scribe were at my direction and in my presence.  I have reviewed the chart and discharge instructions (if applicable) and agree that the record reflects my personal performance and is accurate and complete. 02/05/2022   I,Amber Collins,acting as a scribe for [name], DO.,have documented all relevant documentation on the behalf of [name], DO,as directed by  [name], DO  while in the presence of [name], DO.   [name], DO

## 2022-02-06 ENCOUNTER — Other Ambulatory Visit: Payer: BC Managed Care – PPO

## 2022-02-06 LAB — COMPREHENSIVE METABOLIC PANEL
ALT: 14 U/L (ref 0–35)
AST: 17 U/L (ref 0–37)
Albumin: 4.1 g/dL (ref 3.5–5.2)
Alkaline Phosphatase: 97 U/L (ref 39–117)
BUN: 8 mg/dL (ref 6–23)
CO2: 29 mEq/L (ref 19–32)
Calcium: 9.9 mg/dL (ref 8.4–10.5)
Chloride: 103 mEq/L (ref 96–112)
Creatinine, Ser: 0.65 mg/dL (ref 0.40–1.20)
GFR: 102.07 mL/min (ref >60.00)
Glucose, Bld: 99 mg/dL (ref 70–99)
Potassium: 3.9 mEq/L (ref 3.5–5.1)
Sodium: 141 mEq/L (ref 135–145)
Total Bilirubin: 0.5 mg/dL (ref 0.2–1.2)
Total Protein: 7.6 g/dL (ref 6.0–8.3)

## 2022-02-06 LAB — CBC WITH DIFFERENTIAL/PLATELET
Basophils Absolute: 0 10*3/uL (ref 0.0–0.1)
Basophils Relative: 0.8 % (ref 0.0–3.0)
Eosinophils Absolute: 0.1 10*3/uL (ref 0.0–0.7)
Eosinophils Relative: 2.2 % (ref 0.0–5.0)
HCT: 36.2 % (ref 36.0–46.0)
Hemoglobin: 12.1 g/dL (ref 12.0–15.0)
Lymphocytes Relative: 37 % (ref 12.0–46.0)
Lymphs Abs: 2.4 10*3/uL (ref 0.7–4.0)
MCHC: 33.4 g/dL (ref 30.0–36.0)
MCV: 94.5 fl (ref 78.0–100.0)
Monocytes Absolute: 0.5 10*3/uL (ref 0.1–1.0)
Monocytes Relative: 7.8 % (ref 3.0–12.0)
Neutro Abs: 3.4 10*3/uL (ref 1.4–7.7)
Neutrophils Relative %: 52.2 % (ref 43.0–77.0)
Platelets: 258 10*3/uL (ref 150.0–400.0)
RBC: 3.82 Mil/uL — ABNORMAL LOW (ref 3.87–5.11)
RDW: 13.1 % (ref 11.5–15.5)
WBC: 6.5 10*3/uL (ref 4.0–10.5)

## 2022-02-08 ENCOUNTER — Encounter (HOSPITAL_BASED_OUTPATIENT_CLINIC_OR_DEPARTMENT_OTHER): Payer: Self-pay

## 2022-02-08 ENCOUNTER — Other Ambulatory Visit: Payer: Self-pay | Admitting: Family Medicine

## 2022-02-08 ENCOUNTER — Ambulatory Visit (HOSPITAL_BASED_OUTPATIENT_CLINIC_OR_DEPARTMENT_OTHER)
Admission: RE | Admit: 2022-02-08 | Discharge: 2022-02-08 | Disposition: A | Discharge status: Home / Self Care | Payer: BC Managed Care – PPO | Source: Ambulatory Visit | Attending: Family Medicine | Admitting: Family Medicine

## 2022-02-08 DIAGNOSIS — C259 Malignant neoplasm of pancreas, unspecified: Secondary | ICD-10-CM | POA: Diagnosis not present

## 2022-02-08 DIAGNOSIS — R1011 Right upper quadrant pain: Secondary | ICD-10-CM | POA: Diagnosis not present

## 2022-02-08 DIAGNOSIS — K769 Liver disease, unspecified: Secondary | ICD-10-CM | POA: Diagnosis not present

## 2022-02-08 DIAGNOSIS — K8689 Other specified diseases of pancreas: Secondary | ICD-10-CM

## 2022-02-08 MED ORDER — IOHEXOL 300 MG/ML  SOLN
100.0000 mL | Freq: Once | INTRAMUSCULAR | Status: AC | PRN
  Administered 2022-02-08: 100 mL via INTRAVENOUS

## 2022-02-09 ENCOUNTER — Other Ambulatory Visit: Payer: Self-pay | Admitting: Family Medicine

## 2022-02-09 ENCOUNTER — Encounter: Payer: Self-pay | Admitting: *Deleted

## 2022-02-09 ENCOUNTER — Ambulatory Visit (HOSPITAL_COMMUNITY)
Admission: RE | Admit: 2022-02-09 | Discharge: 2022-02-09 | Disposition: A | Discharge status: Home / Self Care | Payer: BC Managed Care – PPO | Source: Ambulatory Visit | Attending: Family Medicine | Admitting: Family Medicine

## 2022-02-09 DIAGNOSIS — K8689 Other specified diseases of pancreas: Secondary | ICD-10-CM

## 2022-02-09 DIAGNOSIS — K769 Liver disease, unspecified: Secondary | ICD-10-CM

## 2022-02-09 DIAGNOSIS — K7689 Other specified diseases of liver: Secondary | ICD-10-CM | POA: Diagnosis not present

## 2022-02-09 MED ORDER — GADOBUTROL 1 MMOL/ML IV SOLN
9.0000 mL | Freq: Once | INTRAVENOUS | Status: AC | PRN
  Administered 2022-02-09: 9 mL via INTRAVENOUS

## 2022-02-09 NOTE — Progress Notes (Signed)
Reached out to [facility] to introduce myself as the office RN Navigator and explain our new patient process. Reviewed the reason for their referral and scheduled their new patient appointment along with labs. Provided address and directions to the office including call back phone number. Reviewed with patient any concerns they may have or any possible barriers to attending their appointment.   Informed patient about my role as a navigator and that I will meet with them prior to their New Patient appointment and more fully discuss what services I can provide. At this time patient has no further questions or needs.    Reviewed clinical presentation with Dr [name]. He is out of the office next week, so orders will be placed to begin staging and diagnosis. She is already scheduled for an MRI later today. When I spoke to the patient I explained orders placed, and that she would be receiving calls to schedule as soon as authorization was received.   Oncology Nurse Navigator Documentation     02/09/2022   10:15 AM  Oncology Nurse Navigator Flowsheets  Abnormal Finding Date 02/08/2022  Diagnosis Status Additional Work Up  Navigator Follow Up Date: 02/12/2022  Navigator Follow Up Reason: Scan Review  Navigator Location [facility]  Referral Date to RadOnc/MedOnc 02/08/2022  Navigator Encounter Type Introductory Phone Call  Patient Visit Type MedOnc  Treatment Phase Abnormal Scans  Barriers/Navigation Needs Coordination of Care;Education  Education Other  Interventions Coordination of Care;Education;Psycho-Social Support  Acuity Level 2-Minimal Needs (1-2 Barriers Identified)  Coordination of Care Appts  Education Method Verbal  Time Spent with Patient 45

## 2022-02-12 ENCOUNTER — Ambulatory Visit (HOSPITAL_COMMUNITY): Payer: BC Managed Care – PPO

## 2022-02-12 ENCOUNTER — Other Ambulatory Visit: Payer: Self-pay | Admitting: Family Medicine

## 2022-02-12 DIAGNOSIS — C259 Malignant neoplasm of pancreas, unspecified: Secondary | ICD-10-CM

## 2022-02-12 MED ORDER — HYDROCODONE-ACETAMINOPHEN 5-325 MG PO TABS: 1.0000 | ORAL_TABLET | ORAL | 0 refills | Status: DC | PRN

## 2022-02-12 NOTE — Progress Notes (Unsigned)
Juliet Rude, MD  Donita Brooks D Approved for [place] core biopsy of liver lesion.   Note that there is a chance of failed biopsy because lesions are small and were not clearly visualized on a recent RUQ [place]. But I think worth another look now that we have MRI and may be able to see something.   Could try CT with IV contrast as well, but first look should be in [place].

## 2022-02-13 ENCOUNTER — Encounter: Payer: Self-pay | Admitting: *Deleted

## 2022-02-13 NOTE — Progress Notes (Signed)
Reviewed patient's MRI. Biopsy has been scheduled for 02/27/2022. Her CT chest is scheduled for 02/15/2022.  Patient called seeking pain control. She also called her PC who provided her with Norco. Reviewed with her to take this medication as prescribed, and that any future pain needs could be addressed by Dr [name].   Oncology Nurse Navigator Documentation     02/13/2022    9:00 AM  Oncology Nurse Navigator Flowsheets  Navigator Follow Up Date: 02/16/2022  Navigator Follow Up Reason: Scan Review  Navigator Location [facility]  Navigator Encounter Type Scan Review;Telephone  Telephone Outgoing Call  Patient Visit Type MedOnc  Treatment Phase Abnormal Scans  Barriers/Navigation Needs Coordination of Care;Education  Education Pain/ Symptom Management  Interventions Education;Psycho-Social Support  Acuity Level 2-Minimal Needs (1-2 Barriers Identified)  Education Method Verbal  Time Spent with Patient 15

## 2022-02-15 ENCOUNTER — Telehealth: Payer: Self-pay | Admitting: Family Medicine

## 2022-02-15 ENCOUNTER — Encounter (HOSPITAL_BASED_OUTPATIENT_CLINIC_OR_DEPARTMENT_OTHER): Payer: Self-pay

## 2022-02-15 ENCOUNTER — Ambulatory Visit (HOSPITAL_BASED_OUTPATIENT_CLINIC_OR_DEPARTMENT_OTHER)
Admission: RE | Admit: 2022-02-15 | Discharge: 2022-02-15 | Disposition: A | Discharge status: Home / Self Care | Payer: BC Managed Care – PPO | Source: Ambulatory Visit | Attending: Hematology & Oncology | Admitting: Hematology & Oncology

## 2022-02-15 DIAGNOSIS — C259 Malignant neoplasm of pancreas, unspecified: Secondary | ICD-10-CM | POA: Diagnosis not present

## 2022-02-15 DIAGNOSIS — R918 Other nonspecific abnormal finding of lung field: Secondary | ICD-10-CM | POA: Diagnosis not present

## 2022-02-15 DIAGNOSIS — J9811 Atelectasis: Secondary | ICD-10-CM | POA: Diagnosis not present

## 2022-02-15 DIAGNOSIS — K8689 Other specified diseases of pancreas: Secondary | ICD-10-CM | POA: Diagnosis not present

## 2022-02-15 DIAGNOSIS — K769 Liver disease, unspecified: Secondary | ICD-10-CM | POA: Insufficient documentation

## 2022-02-15 MED ORDER — IOHEXOL 300 MG/ML  SOLN
100.0000 mL | Freq: Once | INTRAMUSCULAR | Status: AC | PRN
  Administered 2022-02-15: 75 mL via INTRAVENOUS

## 2022-02-15 NOTE — Telephone Encounter (Signed)
Patient brought in form for LC to fill out Placed in bin up front  Patient would like the form to be faxed([phone]), a call let her know it is done & a copy for herself

## 2022-02-19 ENCOUNTER — Inpatient Hospital Stay: Payer: BC Managed Care – PPO | Admitting: Hematology & Oncology

## 2022-02-19 ENCOUNTER — Encounter: Payer: Self-pay | Admitting: Hematology & Oncology

## 2022-02-19 ENCOUNTER — Inpatient Hospital Stay: Payer: BC Managed Care – PPO | Attending: Hematology & Oncology

## 2022-02-19 VITALS — BP 123/84 | HR 86 | Temp 98.2°F | Resp 20 | Ht 65.0 in | Wt 200.0 lb

## 2022-02-19 DIAGNOSIS — K7689 Other specified diseases of liver: Secondary | ICD-10-CM | POA: Diagnosis not present

## 2022-02-19 DIAGNOSIS — Z809 Family history of malignant neoplasm, unspecified: Secondary | ICD-10-CM | POA: Diagnosis not present

## 2022-02-19 DIAGNOSIS — K8689 Other specified diseases of pancreas: Secondary | ICD-10-CM | POA: Insufficient documentation

## 2022-02-19 DIAGNOSIS — I10 Essential (primary) hypertension: Secondary | ICD-10-CM | POA: Insufficient documentation

## 2022-02-19 DIAGNOSIS — Z87891 Personal history of nicotine dependence: Secondary | ICD-10-CM | POA: Diagnosis not present

## 2022-02-19 DIAGNOSIS — R933 Abnormal findings on diagnostic imaging of other parts of digestive tract: Secondary | ICD-10-CM | POA: Diagnosis not present

## 2022-02-19 DIAGNOSIS — R932 Abnormal findings on diagnostic imaging of liver and biliary tract: Secondary | ICD-10-CM | POA: Diagnosis not present

## 2022-02-19 DIAGNOSIS — Z800 Family history of malignant neoplasm of digestive organs: Secondary | ICD-10-CM | POA: Diagnosis not present

## 2022-02-19 DIAGNOSIS — F17290 Nicotine dependence, other tobacco product, uncomplicated: Secondary | ICD-10-CM | POA: Insufficient documentation

## 2022-02-19 DIAGNOSIS — K869 Disease of pancreas, unspecified: Secondary | ICD-10-CM | POA: Insufficient documentation

## 2022-02-19 DIAGNOSIS — F17210 Nicotine dependence, cigarettes, uncomplicated: Secondary | ICD-10-CM | POA: Diagnosis not present

## 2022-02-19 LAB — CMP (CANCER CENTER ONLY)
ALT: 14 U/L (ref 0–44)
AST: 20 U/L (ref 15–41)
Albumin: 4.3 g/dL (ref 3.5–5.0)
Alkaline Phosphatase: 96 U/L (ref 38–126)
Anion gap: 10 (ref 5–15)
BUN: 9 mg/dL (ref 6–20)
CO2: 28 mmol/L (ref 22–32)
Calcium: 10 mg/dL (ref 8.9–10.3)
Chloride: 103 mmol/L (ref 98–111)
Creatinine: 0.65 mg/dL (ref 0.44–1.00)
GFR, Estimated: >60 mL/min (ref >60)
Glucose, Bld: 102 mg/dL — ABNORMAL HIGH (ref 70–99)
Potassium: 3.4 mmol/L — ABNORMAL LOW (ref 3.5–5.1)
Sodium: 141 mmol/L (ref 135–145)
Total Bilirubin: 0.7 mg/dL (ref 0.3–1.2)
Total Protein: 7.5 g/dL (ref 6.5–8.1)

## 2022-02-19 LAB — CBC WITH DIFFERENTIAL (CANCER CENTER ONLY)
Abs Immature Granulocytes: 0.05 10*3/uL (ref 0.00–0.07)
Basophils Absolute: 0 10*3/uL (ref 0.0–0.1)
Basophils Relative: 1 %
Eosinophils Absolute: 0.2 10*3/uL (ref 0.0–0.5)
Eosinophils Relative: 4 %
HCT: 35.3 % — ABNORMAL LOW (ref 36.0–46.0)
Hemoglobin: 11.9 g/dL — ABNORMAL LOW (ref 12.0–15.0)
Immature Granulocytes: 1 %
Lymphocytes Relative: 35 %
Lymphs Abs: 1.8 10*3/uL (ref 0.7–4.0)
MCH: 31.6 pg (ref 26.0–34.0)
MCHC: 33.7 g/dL (ref 30.0–36.0)
MCV: 93.9 fL (ref 80.0–100.0)
Monocytes Absolute: 0.4 10*3/uL (ref 0.1–1.0)
Monocytes Relative: 7 %
Neutro Abs: 2.7 10*3/uL (ref 1.7–7.7)
Neutrophils Relative %: 52 %
Platelet Count: 257 10*3/uL (ref 150–400)
RBC: 3.76 MIL/uL — ABNORMAL LOW (ref 3.87–5.11)
RDW: 12.2 % (ref 11.5–15.5)
WBC Count: 5.1 10*3/uL (ref 4.0–10.5)
nRBC: 0 % (ref 0.0–0.2)

## 2022-02-19 LAB — LACTATE DEHYDROGENASE: LDH: 238 U/L — ABNORMAL HIGH (ref 98–192)

## 2022-02-19 LAB — PREALBUMIN: Prealbumin: 15 mg/dL — ABNORMAL LOW (ref 18–38)

## 2022-02-19 NOTE — Progress Notes (Signed)
Referral MD  Reason for Referral: Pancreatic mass with liver metastasis  Chief Complaint  Patient presents with   New Patient (Initial Visit)    Pancreatic cancer.  : I probably have pancreatic cancer.  HPI: Makayla Good is a very charming 51 year old Afro-American female.  She comes in with I think her sister.  She has 2 sons.  She works for [employer].  She has been in good shape.  She is followed by Dr. Carollee Herter.  As always, Dr. Carollee Herter is incredibly thorough.  She had seen Makayla Good couple weeks or so ago.  Makayla Good was complaining of some abdominal discomfort.  Upon that, Dr. Carollee Herter went ahead and did a CT scan on her.  This was done on 02/08/2022.  The CT scan unfortunately showed that there were numerous lesions in the liver.  She also had a mass in the pancreatic tail measuring 3.3 x 2.4 cm.  She had some mildly prominent upper abdominal lymph nodes.  A MRI of the abdomen was then done.  This was done on 02/09/2022.  This confirmed multiple lesions in the liver.  The largest measured 1.8 x 1.5 cm.  In the pancreas, there was a mass that measured 2.9 x 2.1 cm.  She did have some enlarged portacaval lymph nodes and left para-aortic lymph nodes.  Upon this, Makayla Good was, referred to the [facility] for evaluation.  She is she is in great shape.  She has not lost weight.  Her appetite might be down a little bit.  She does have abdominal discomfort.  This is in the upper abdomen with some radiation around her right side.  She also has a little bit of back discomfort.  She has had no nausea or vomiting.  There is been no diarrhea.  She has had no bleeding.  She did smoke.  She stopped I think a few months ago.  She probably has about a 30-pack-year history of tobacco use.  There really is no alcohol use.  There is a family history of malignancy.  I think there might be history of stomach cancer in the family.  She is up-to-date with her  mammograms.  She has had no headache.  There is been no leg pain or swelling.  Overall, I would say performance status is probably ECOG 0.    Past Medical History:  Diagnosis Date   Asthma    Eczema    Fibroid    H/O oophorectomy    Hypertension    Obesity    Polycystic ovary    Left   Sleep apnea    no CPAP per MD  :   Past Surgical History:  Procedure Laterality Date   BACK SURGERY     13yr ago   BREAST BIOPSY Right 2019   fibroadenoma   CESAREAN SECTION  02/01/2005   CESAREAN SECTION  2001   LAPAROSCOPIC GASTRIC SLEEVE RESECTION N/A 07/29/2017   Procedure: LAPAROSCOPIC GASTRIC SLEEVE RESECTION WITH UPPER ENDO ;  Surgeon: CClovis Riley MD;  Location: [facility];  Service: General;  Laterality: N/A;   OOPHORECTOMY     Rt.removed   TUBAL LIGATION  02/01/2005  :   Current Outpatient Medications:    amLODipine (NORVASC) 5 MG tablet, TAKE 1 TABLET (5 MG TOTAL) BY MOUTH DAILY., Disp: 90 tablet, Rfl: 1   CALCIUM PO, Take by mouth., Disp: , Rfl:    fluticasone (FLONASE) 50 MCG/ACT nasal spray, [facility] 2 [facility]  [facility] EVERY DAY, Disp: 48 mL, Rfl: 2   hydrochlorothiazide (HYDRODIURIL) 25 MG tablet, Take 1 tablet (25 mg total) by mouth daily., Disp: 90 tablet, Rfl: 1   HYDROcodone-acetaminophen (NORCO) 5-325 MG tablet, Take 1 tablet by mouth every 4 (four) hours as needed for moderate pain., Disp: 60 tablet, Rfl: 0   Melatonin 10 MG TABS, Take by mouth at bedtime as needed., Disp: , Rfl:    Multiple Vitamins-Minerals (CELEBRATE MULTI-COMPLETE 36 PO), Take 1 each by mouth daily., Disp: , Rfl:    nicotine polacrilex (NICORETTE) 4 MG gum, Take 4 mg by mouth as needed for smoking cessation., Disp: , Rfl:    potassium chloride (KLOR-CON) 10 MEQ tablet, TAKE 1 TABLET BY MOUTH EVERY DAY, Disp: 90 tablet, Rfl: 1   Biotin w/ Vitamins C & E (HAIR/SKIN/NAILS PO), Take 1 tablet by mouth daily. (Patient not taking: Reported on 02/19/2022), Disp: , Rfl:    cyclobenzaprine  (FLEXERIL) 10 MG tablet, Take 1 tablet (10 mg total) by mouth at bedtime as needed for muscle spasms. (Patient not taking: Reported on 02/19/2022), Disp: 30 tablet, Rfl: 0   nicotine (NICODERM CQ - DOSED IN MG/24 HOURS) 14 mg/24hr patch, Place 1 patch (14 mg total) onto the skin daily. (Patient not taking: Reported on 02/19/2022), Disp: 28 patch, Rfl: 1   nicotine (NICODERM CQ) 7 mg/24hr patch, Place 1 patch (7 mg total) onto the skin daily. (Patient not taking: Reported on 02/19/2022), Disp: 28 patch, Rfl: 0   omeprazole (PRILOSEC) 40 MG capsule, Take 1 capsule (40 mg total) by mouth daily. (Patient not taking: Reported on 02/19/2022), Disp: 30 capsule, Rfl: 0:  :  No Known Allergies:   Family History  Problem Relation Age of Onset   Hypertension Mother    Schizophrenia Mother    Dementia Mother    Mental illness Mother        schizophrenia, dementia   Hypertension Father    Coronary artery disease Father        Stent   Alzheimer's disease Father    Hypertension Sister    Hypertension Sister    Arthritis Sister        rheumatoid   Dementia Maternal Aunt    Kidney disease Other    Colon cancer Neg Hx    Stomach cancer Neg Hx    Pancreatic cancer Neg Hx    Esophageal cancer Neg Hx    Rectal cancer Neg Hx    Breast cancer Neg Hx   :   Social History   Socioeconomic History   Marital status: Married    Spouse name: Not on file   Number of children: Not on file   Years of education: 59   Highest education level: Not on file  Occupational History   Occupation: release liens    Employer: [employer]  Tobacco Use   Smoking status: Former    Packs/day: 0.50    Years: 30.00    Total pack years: 15.00    Types: Cigarettes, E-cigarettes    Start date: 04/28/2020    Quit date: 2022    Years since quitting: 1.8   Smokeless tobacco: Never   Tobacco comments:    VAPES  Vaping Use   Vaping Use: Every day   Substances: Nicotine  Substance and Sexual Activity   Alcohol  use: Yes    Comment: occasional   Drug use: No   Sexual activity: Not Currently    Partners: Male    Birth  control/protection: Surgical    Comment: Tubal/been with same person over 20 years  Other Topics Concern   Not on file  Social History Narrative   Exercise--gym, every other day--- at least 3 days a week   Social Determinants of Health   Financial Resource Strain: Not on file  Food Insecurity: Food Insecurity Present (03/07/2017)   Hunger Vital Sign    Worried About Running Out of Food in the Last Year: Sometimes true    Ran Out of Food in the Last Year: Never true  Transportation Needs: Not on file  Physical Activity: Not on file  Stress: Not on file  Social Connections: Not on file  Intimate Partner Violence: Not on file  :  Review of Systems  Constitutional: Negative.   HENT: Negative.    Eyes: Negative.   Respiratory: Negative.    Cardiovascular: Negative.   Gastrointestinal:  Positive for abdominal pain.  Genitourinary: Negative.   Musculoskeletal: Negative.   Skin: Negative.   Neurological: Negative.   Endo/Heme/Allergies: Negative.   Psychiatric/Behavioral: Negative.       Exam: Vital signs show temperature of 98.2.  Pulse 86.  Blood pressure 123/84.  Weight is 200 pounds.  '@IPVITALS'$ @ Physical Exam Vitals reviewed.  HENT:     Head: Normocephalic and atraumatic.  Eyes:     Pupils: Pupils are equal, round, and reactive to light.  Cardiovascular:     Rate and Rhythm: Normal rate and regular rhythm.     Heart sounds: Normal heart sounds.  Pulmonary:     Effort: Pulmonary effort is normal.     Breath sounds: Normal breath sounds.  Abdominal:     General: Bowel sounds are normal.     Palpations: Abdomen is soft.     Comments: Abdominal exam is soft.  She has decent bowel sounds.  There is no fluid wave.  There is no guarding or rebound tenderness.  She has no obvious abdominal mass.  There is no palpable liver or spleen tip.  Musculoskeletal:         General: No tenderness or deformity. Normal range of motion.     Cervical back: Normal range of motion.  Lymphadenopathy:     Cervical: No cervical adenopathy.  Skin:    General: Skin is warm and dry.     Findings: No erythema or rash.  Neurological:     Mental Status: She is alert and oriented to person, place, and time.  Psychiatric:        Behavior: Behavior normal.        Thought Content: Thought content normal.        Judgment: Judgment normal.     Recent Labs    02/19/22 1039  WBC 5.1  HGB 11.9*  HCT 35.3*  PLT 257    Recent Labs    02/19/22 1039  NA 141  K 3.4*  CL 103  CO2 28  GLUCOSE 102*  BUN 9  CREATININE 0.65  CALCIUM 10.0    Blood smear review: None  Pathology: Pending    Assessment and Plan: Makayla Good is a very charming 51 year old African-American female.  For all intent and purposes, it looks like she has metastatic pancreatic cancer.  This would be stage IV by the liver metastasis.  We really need to get a biopsy.  We will have to see what the CA 19-9 shows.  This could certainly help [place] out with determining the diagnosis.  Regardless, we have to get a biopsy and send  off for molecular markers to make sure that there is no targeted agents that we can treat her with.  I forgot to mention that she has had a CT of the chest.  This was unremarkable for any obvious metastatic disease.  Again, I had a long talk with Makayla Good and her sister.  I reviewed the CT scan and MRI with them.  I showed them where the cancers were in the liver and where the cancer was in the tail of the pancreas.  I explained that we are dealing with a disease that is treatable but not curable.  For somebody with likely pancreatic cancer, she is in fantastic shape.  She really looks great.  As such, I think we can be aggressive with her.  Hopefully, we can see about moving her biopsy up to this week.  We really need to get the molecular studies back.  I think that if  we were to treat her, we could use FOLFIRINOX or the newer version that uses liposomal irinotecan.  I cannot think of any other studies that we have to get on her.  This really is about tissue diagnosis now.  I will plan to have her come back once we get the results back from the biopsy and the molecular markers.  I did give her a prayer blanket.  She was very thankful for this.  She also has a very strong faith and we had good fellowship during our meeting today.

## 2022-02-20 ENCOUNTER — Encounter: Payer: Self-pay | Admitting: *Deleted

## 2022-02-20 ENCOUNTER — Ambulatory Visit (HOSPITAL_COMMUNITY)
Admission: RE | Admit: 2022-02-20 | Discharge: 2022-02-20 | Disposition: A | Discharge status: Home / Self Care | Payer: BC Managed Care – PPO | Source: Ambulatory Visit | Attending: Hematology & Oncology | Admitting: Hematology & Oncology

## 2022-02-20 ENCOUNTER — Encounter (HOSPITAL_COMMUNITY): Payer: Self-pay

## 2022-02-20 ENCOUNTER — Other Ambulatory Visit: Payer: Self-pay | Admitting: Radiology

## 2022-02-20 ENCOUNTER — Other Ambulatory Visit: Payer: Self-pay

## 2022-02-20 DIAGNOSIS — K7689 Other specified diseases of liver: Secondary | ICD-10-CM | POA: Diagnosis not present

## 2022-02-20 DIAGNOSIS — K769 Liver disease, unspecified: Secondary | ICD-10-CM

## 2022-02-20 DIAGNOSIS — C787 Secondary malignant neoplasm of liver and intrahepatic bile duct: Secondary | ICD-10-CM | POA: Diagnosis not present

## 2022-02-20 DIAGNOSIS — K8689 Other specified diseases of pancreas: Secondary | ICD-10-CM

## 2022-02-20 DIAGNOSIS — C259 Malignant neoplasm of pancreas, unspecified: Secondary | ICD-10-CM | POA: Diagnosis not present

## 2022-02-20 LAB — CBC
HCT: 34.4 % — ABNORMAL LOW (ref 36.0–46.0)
Hemoglobin: 12 g/dL (ref 12.0–15.0)
MCH: 32.5 pg (ref 26.0–34.0)
MCHC: 34.9 g/dL (ref 30.0–36.0)
MCV: 93.2 fL (ref 80.0–100.0)
Platelets: 245 K/uL (ref 150–400)
RBC: 3.69 MIL/uL — ABNORMAL LOW (ref 3.87–5.11)
RDW: 12.4 % (ref 11.5–15.5)
WBC: 5 K/uL (ref 4.0–10.5)
nRBC: 0 % (ref 0.0–0.2)

## 2022-02-20 LAB — PROTIME-INR
INR: 1 (ref 0.8–1.2)
Prothrombin Time: 13.3 seconds (ref 11.4–15.2)

## 2022-02-20 MED ORDER — FENTANYL CITRATE (PF) 100 MCG/2ML IJ SOLN
INTRAMUSCULAR | Status: AC
  Filled 2022-02-20: qty 2

## 2022-02-20 MED ORDER — MIDAZOLAM HCL 2 MG/2ML IJ SOLN
INTRAMUSCULAR | Status: AC
  Filled 2022-02-20: qty 2

## 2022-02-20 MED ORDER — LIDOCAINE-EPINEPHRINE 1 %-1:100000 IJ SOLN: 8.0000 mL | Freq: Once | INTRAMUSCULAR | Status: DC

## 2022-02-20 MED ORDER — SODIUM CHLORIDE 0.9 % IV SOLN: INTRAVENOUS | Status: DC

## 2022-02-20 MED ORDER — GELATIN ABSORBABLE 12-7 MM EX MISC
CUTANEOUS | Status: AC
  Filled 2022-02-20: qty 1

## 2022-02-20 MED ORDER — FENTANYL CITRATE (PF) 100 MCG/2ML IJ SOLN
INTRAMUSCULAR | Status: AC | PRN
  Administered 2022-02-20 (×3): 25 ug via INTRAVENOUS

## 2022-02-20 MED ORDER — MIDAZOLAM HCL 2 MG/2ML IJ SOLN
INTRAMUSCULAR | Status: AC | PRN
  Administered 2022-02-20: 1 mg via INTRAVENOUS

## 2022-02-20 MED ORDER — LIDOCAINE-EPINEPHRINE 1 %-1:100000 IJ SOLN
INTRAMUSCULAR | Status: AC
  Administered 2022-02-20: 8 mL via INTRADERMAL
  Filled 2022-02-20: qty 1

## 2022-02-20 NOTE — H&P (Signed)
Chief Complaint: Patient was seen in consultation today for liver lesion biopsy at the request of Ennever,Peter R  Referring Physician(s): Ennever,Peter R  Supervising Physician: Aletta Edouard  Patient Status: [facility] - Out-pt  History of Present Illness: Makayla Good is a 51 y.o. female   Complaining of abd pain and was seen by PMD Denies N/V/D Denies wt loss  CT 10/19: IMPRESSION: 1. Ill-defined mass in the pancreatic tail with numerous ill-defined hypoattenuating lesions throughout all segments of the liver, worrisome for pancreatic malignancy with multifocal hepatic metastatic disease. Recommend further evaluation with abdominal MRI without and with contrast. 2. Mildly prominent upper abdominal lymph nodes are nonspecific, but could reflect nodal metastases.  MR 10/20: IMPRESSION: 1. Multiple rim enhancing lesions scattered throughout both lobes of liver compatible with metastatic disease. 2. Heterogeneous, centrally necrotic mass arising off the tail of pancreas measures 2.9 x 2.1 cm. This is concerning for a primary pancreatic neoplasm. 3. Enlarged portacaval and left periaortic lymph nodes compatible with metastatic disease.  Staging CT 10/26: IMPRESSION: 1. No definite findings to suggest metastatic disease in the chest. 2. Lymph nodes of normal to upper normal size are seen in the anterior mediastinum and right hilum. Attention on follow-up recommended. 3. 3 mm right middle and lower lobe perifissural nodules, likely benign. These could also be reassessed at the time of restaging.   Pt was referred to Dr [name] Scheduled now for liver lesion biopsy  Past Medical History:  Diagnosis Date   Asthma    Eczema    Fibroid    H/O oophorectomy    Hypertension    Obesity    Polycystic ovary    Left   Sleep apnea    no CPAP per MD    Past Surgical History:  Procedure Laterality Date   BACK SURGERY     68yr ago   BREAST BIOPSY Right  2019   fibroadenoma   CESAREAN SECTION  02/01/2005   CESAREAN SECTION  2001   LAPAROSCOPIC GASTRIC SLEEVE RESECTION N/A 07/29/2017   Procedure: LAPAROSCOPIC GASTRIC SLEEVE RESECTION WITH UPPER ENDO ;  Surgeon: CClovis Riley MD;  Location: [facility];  Service: General;  Laterality: N/A;   OOPHORECTOMY     Rt.removed   TUBAL LIGATION  02/01/2005    Allergies: Patient has no known allergies.  Medications: Prior to Admission medications   Medication Sig Start Date End Date Taking? Authorizing Provider  amLODipine (NORVASC) 5 MG tablet TAKE 1 TABLET (5 MG TOTAL) BY MOUTH DAILY. 11/07/21  Yes LAnn Held DO  cyclobenzaprine (FLEXERIL) 10 MG tablet Take 1 tablet (10 mg total) by mouth at bedtime as needed for muscle spasms. 01/25/22  Yes Williams, MJinny BlossomE, NP  fluticasone (FLONASE) 50 MCG/ACT nasal spray SPRAY 2 SPRAYS INTO EACH NOSTRIL EVERY DAY Patient taking differently: Place 2 sprays into both nostrils daily as needed for allergies. 05/11/21  Yes LRoma SchanzR, DO  hydrochlorothiazide (HYDRODIURIL) 25 MG tablet Take 1 tablet (25 mg total) by mouth daily. 12/29/21  Yes LAnn Held DO  HYDROcodone-acetaminophen (NORCO) 5-325 MG tablet Take 1 tablet by mouth every 4 (four) hours as needed for moderate pain. 02/12/22  Yes LAnn Held DO  Melatonin 10 MG TABS Take 10 mg by mouth at bedtime as needed (sleep).   Yes [provider]  Multiple Vitamins-Minerals (CELEBRATE MULTI-COMPLETE 36 PO) Take 1 each by mouth daily.   Yes [provider]  nicotine polacrilex (NICORETTE)  4 MG gum Take 4 mg by mouth as needed for smoking cessation.   Yes [provider]  omeprazole (PRILOSEC) 40 MG capsule Take 1 capsule (40 mg total) by mouth daily. Patient taking differently: Take 40 mg by mouth daily as needed (acid reflux). 01/22/22  Yes Martinique, Betty G, MD  potassium chloride (KLOR-CON) 10 MEQ tablet TAKE 1 TABLET BY MOUTH EVERY DAY 10/11/21  Yes  Roma Schanz R, DO  nicotine (NICODERM CQ - DOSED IN MG/24 HOURS) 14 mg/24hr patch Place 1 patch (14 mg total) onto the skin daily. Patient not taking: Reported on 02/19/2022 12/08/21   Carollee Herter, Alferd Apa, DO  nicotine (NICODERM CQ) 7 mg/24hr patch Place 1 patch (7 mg total) onto the skin daily. Patient not taking: Reported on 02/19/2022 12/08/21   [name], DO     Family History  Problem Relation Age of Onset   Hypertension Mother    Schizophrenia Mother    Dementia Mother    Mental illness Mother        schizophrenia, dementia   Hypertension Father    Coronary artery disease Father        Stent   Alzheimer's disease Father    Hypertension Sister    Hypertension Sister    Arthritis Sister        rheumatoid   Dementia Maternal Aunt    Kidney disease Other    Colon cancer Neg Hx    Stomach cancer Neg Hx    Pancreatic cancer Neg Hx    Esophageal cancer Neg Hx    Rectal cancer Neg Hx    Breast cancer Neg Hx     Social History   Socioeconomic History   Marital status: Married    Spouse name: Not on file   Number of children: Not on file   Years of education: 12   Highest education level: Not on file  Occupational History   Occupation: release liens    Employer: [employer]  Tobacco Use   Smoking status: Former    Packs/day: 0.50    Years: 30.00    Total pack years: 15.00    Types: Cigarettes, E-cigarettes    Start date: 04/28/2020    Quit date: 2022    Years since quitting: 1.8   Smokeless tobacco: Never   Tobacco comments:    VAPES  Vaping Use   Vaping Use: Every day   Substances: Nicotine  Substance and Sexual Activity   Alcohol use: Yes    Comment: occasional   Drug use: No   Sexual activity: Not Currently    Partners: Male    Birth control/protection: Surgical    Comment: Tubal/been with same person over 20 years  Other Topics Concern   Not on file  Social History Narrative   Exercise--gym, every other day--- at least 3 days  a week   Social Determinants of Health   Financial Resource Strain: Not on file  Food Insecurity: Food Insecurity Present (03/07/2017)   Hunger Vital Sign    Worried About Running Out of Food in the Last Year: Sometimes true    Ran Out of Food in the Last Year: Never true  Transportation Needs: Not on file  Physical Activity: Not on file  Stress: Not on file  Social Connections: Not on file    Review of Systems: A 12 point ROS discussed and pertinent positives are indicated in the HPI above.  All other systems are negative.  Review  of Systems  Constitutional:  Negative for activity change, fatigue, fever and unexpected weight change.  Respiratory:  Negative for cough and shortness of breath.   Cardiovascular:  Negative for chest pain.  Gastrointestinal:  Positive for abdominal pain.  Musculoskeletal:  Positive for back pain.  Psychiatric/Behavioral:  Negative for behavioral problems and confusion.     Vital Signs: BP 126/87   Pulse 85   Temp 97.7 F (36.5 C) (Temporal)   Resp 18   Ht '5\' 5"'$  (1.651 m)   Wt 200 lb (90.7 kg)   LMP 07/27/2020 (Within Days)   SpO2 99%   BMI 33.28 kg/m     Physical Exam Vitals reviewed.  HENT:     Mouth/Throat:     Mouth: Mucous membranes are moist.  Eyes:     Extraocular Movements: Extraocular movements intact.  Cardiovascular:     Rate and Rhythm: Normal rate and regular rhythm.     Heart sounds: Normal heart sounds.  Pulmonary:     Effort: Pulmonary effort is normal.     Breath sounds: Normal breath sounds.  Abdominal:     Palpations: Abdomen is soft.     Tenderness: There is abdominal tenderness.  Musculoskeletal:        General: Normal range of motion.  Skin:    General: Skin is warm.  Neurological:     Mental Status: She is alert and oriented to person, place, and time.  Psychiatric:        Behavior: Behavior normal.     Imaging: CT Chest W Contrast  Result Date: 02/16/2022 CLINICAL DATA:  Pancreatic cancer.   Staging.  * Tracking Code: BO * EXAM: CT CHEST WITH CONTRAST TECHNIQUE: Multidetector CT imaging of the chest was performed during intravenous contrast administration. RADIATION DOSE REDUCTION: This exam was performed according to the departmental dose-optimization program which includes automated exposure control, adjustment of the mA and/or kV according to patient size and/or use of iterative reconstruction technique. CONTRAST:  75m OMNIPAQUE IOHEXOL 300 MG/ML  SOLN COMPARISON:  None Available. FINDINGS: Cardiovascular: The heart size is normal. No substantial pericardial effusion. No thoracic aortic aneurysm. No substantial atherosclerosis of the thoracic aorta. Mediastinum/Nodes: Lymph nodes of normal to upper normal size are seen in the anterior mediastinum. No mediastinal lymphadenopathy. 9 mm short axis right hilar node evident with no hilar lymphadenopathy by CT size criteria. The esophagus has normal imaging features. There is no axillary lymphadenopathy. Lungs/Pleura: 3 mm right middle lobe perifissural nodule seen on image 78/4. 3 mm right lower lobe perifissural nodule seen on image 70/4. No overtly suspicious pulmonary nodule or mass. No focal airspace consolidation. Subsegmental atelectasis noted in both lung bases. No pleural effusion. Upper Abdomen: Please see report for dedicated abdominal MRI performed 6 days ago. Musculoskeletal: No worrisome lytic or sclerotic osseous abnormality. IMPRESSION: 1. No definite findings to suggest metastatic disease in the chest. 2. Lymph nodes of normal to upper normal size are seen in the anterior mediastinum and right hilum. Attention on follow-up recommended. 3. 3 mm right middle and lower lobe perifissural nodules, likely benign. These could also be reassessed at the time of restaging. Electronically Signed   By: EMisty StanleyM.D.   On: 02/16/2022 15:27   MR Abdomen W Wo Contrast  Result Date: 02/09/2022 CLINICAL DATA:  Evaluate for metastatic disease.  EXAM: MRI ABDOMEN WITHOUT AND WITH CONTRAST TECHNIQUE: Multiplanar multisequence MR imaging of the abdomen was performed both before and after the administration of intravenous contrast. CONTRAST:  62m GADAVIST GADOBUTROL 1 MMOL/ML IV SOLN COMPARISON:  CT AP 02/08/2022 FINDINGS: Lower chest: No acute findings. Hepatobiliary: Multiple rim enhancing lesions are scattered throughout both lobes of liver compatible with metastatic disease. Index lesions include: -segment 8/4 lesion measures 1.8 x 1.5 cm, image 36/16. -segment 6 lesion measures 1.6 x 1.3 cm, image 52/16. -segment 2 lesion measures 1.4 x 1.0 cm, image 33/16. Gallbladder appears within normal limits. Common bile duct measures up to 5 mm, image 14/3. No intrahepatic bile duct dilatation. Pancreas: There is no main duct dilatation. No pancreatic inflammation. Heterogeneous, centrally necrotic mass arising off the tail of pancreas measures 2.9 x 2.1 cm, image 43/16. Spleen:  Within normal limits in size and appearance. Adrenals/Urinary Tract: Normal adrenal glands. No nephrolithiasis, hydronephrosis or kidney mass. Stomach/Bowel: Visualized portions within the abdomen are unremarkable. Vascular/Lymphatic: Normal caliber abdominal aorta. The upper abdominal vascularity is patent. Enlarged portacaval lymph node measures 1.6 cm short axis, image 13/5 left periaortic lymph node measures 1.1 cm, image 23/5. Other:  No ascites. Musculoskeletal: No suspicious bone lesions identified. IMPRESSION: 1. Multiple rim enhancing lesions scattered throughout both lobes of liver compatible with metastatic disease. 2. Heterogeneous, centrally necrotic mass arising off the tail of pancreas measures 2.9 x 2.1 cm. This is concerning for a primary pancreatic neoplasm. 3. Enlarged portacaval and left periaortic lymph nodes compatible with metastatic disease. Electronically Signed   By: TKerby MoorsM.D.   On: 02/09/2022 13:24   MR 3D Recon At Scanner  Result Date:  02/09/2022 CLINICAL DATA:  Evaluate for metastatic disease. EXAM: MRI ABDOMEN WITHOUT AND WITH CONTRAST TECHNIQUE: Multiplanar multisequence MR imaging of the abdomen was performed both before and after the administration of intravenous contrast. CONTRAST:  950mGADAVIST GADOBUTROL 1 MMOL/ML IV SOLN COMPARISON:  CT AP 02/08/2022 FINDINGS: Lower chest: No acute findings. Hepatobiliary: Multiple rim enhancing lesions are scattered throughout both lobes of liver compatible with metastatic disease. Index lesions include: -segment 8/4 lesion measures 1.8 x 1.5 cm, image 36/16. -segment 6 lesion measures 1.6 x 1.3 cm, image 52/16. -segment 2 lesion measures 1.4 x 1.0 cm, image 33/16. Gallbladder appears within normal limits. Common bile duct measures up to 5 mm, image 14/3. No intrahepatic bile duct dilatation. Pancreas: There is no main duct dilatation. No pancreatic inflammation. Heterogeneous, centrally necrotic mass arising off the tail of pancreas measures 2.9 x 2.1 cm, image 43/16. Spleen:  Within normal limits in size and appearance. Adrenals/Urinary Tract: Normal adrenal glands. No nephrolithiasis, hydronephrosis or kidney mass. Stomach/Bowel: Visualized portions within the abdomen are unremarkable. Vascular/Lymphatic: Normal caliber abdominal aorta. The upper abdominal vascularity is patent. Enlarged portacaval lymph node measures 1.6 cm short axis, image 13/5 left periaortic lymph node measures 1.1 cm, image 23/5. Other:  No ascites. Musculoskeletal: No suspicious bone lesions identified. IMPRESSION: 1. Multiple rim enhancing lesions scattered throughout both lobes of liver compatible with metastatic disease. 2. Heterogeneous, centrally necrotic mass arising off the tail of pancreas measures 2.9 x 2.1 cm. This is concerning for a primary pancreatic neoplasm. 3. Enlarged portacaval and left periaortic lymph nodes compatible with metastatic disease. Electronically Signed   By: TaKerby Moors.D.   On: 02/09/2022  13:24   CT Abdomen Pelvis W Contrast  Result Date: 02/08/2022 CLINICAL DATA:  Right lower quadrant abdominal pain. Two weeks of right upper quadrant abdominal pain, especially after eating solids. No given history of malignancy. EXAM: CT ABDOMEN AND PELVIS WITH CONTRAST TECHNIQUE: Multidetector CT imaging of the abdomen and pelvis was performed  using the standard protocol following bolus administration of intravenous contrast. RADIATION DOSE REDUCTION: This exam was performed according to the departmental dose-optimization program which includes automated exposure control, adjustment of the mA and/or kV according to patient size and/or use of iterative reconstruction technique. CONTRAST:  159m OMNIPAQUE IOHEXOL 300 MG/ML  SOLN COMPARISON:  Abdominal ultrasound 01/25/2022 and 05/06/2020. No prior CT available. FINDINGS: Lower chest: Linear scarring at both lung bases. No suspicious pulmonary nodules are demonstrated. No significant pleural or pericardial effusion. Hepatobiliary: There are numerous ill-defined hypoattenuating lesions throughout all segments of the liver, worrisome for multifocal hepatic metastatic disease. Representative lesions include a 1.3 cm lesion anteriorly in the right lobe on image 13/2, a 1.0 cm lesion inferiorly in the right lobe on image 23/2 and a 1.2 cm lesion in the left lobe on image 15/2. Incomplete gallbladder distension with possible small polyp laterally. No evidence of gallstones or pericholecystic fluid. No biliary dilatation. Pancreas: There is an ill-defined mass in the pancreatic tail measuring 3.3 x 2.4 cm on image 18/2, worrisome for pancreatic malignancy based on the hepatic findings. The remainder of the pancreas appears normal, without ductal dilatation or definite surrounding inflammation. Spleen: Normal in size without focal abnormality. Adrenals/Urinary Tract: Both adrenal glands appear normal. No evidence of urinary tract calculus, suspicious renal lesion or  hydronephrosis. There is a presumed phlebolith near the left ureterovesical junction. The bladder appears unremarkable for its degree of distention. Stomach/Bowel: Enteric contrast was administered and has passed into the distal colon. 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 No evidence of bowel distension, surrounding inflammation or wall thickening. The appendix appears normal. There are diverticular changes within the sigmoid colon without evidence of acute inflammation. Vascular/Lymphatic: Prominent lymph nodes within the upper abdomen which must be viewed with some suspicion given the other findings above. There is a 9 mm node in the porta hepatis on image 20/2, a 1.5 cm portacaval node on image 22/2 and a left periaortic node measuring 1.0 cm on image 29/2. Mild aortic and branch vessel atherosclerosis without evidence of aneurysm. The portal, superior mesenteric and splenic veins are patent. Reproductive: The uterus and ovaries appear unremarkable. No suspicious adnexal masses. Other: No ascites or peritoneal nodularity.  Intact abdominal wall. Musculoskeletal: No acute osseous findings or evidence of osseous metastatic disease. Spondylosis in the lower lumbar spine. IMPRESSION: 1. Ill-defined mass in the pancreatic tail with numerous ill-defined hypoattenuating lesions throughout all segments of the liver, worrisome for pancreatic malignancy with multifocal hepatic metastatic disease. Recommend further evaluation with abdominal MRI without and with contrast. 2. Mildly prominent upper abdominal lymph nodes are nonspecific, but could reflect nodal metastases. 3. No evidence of bowel obstruction or perforation. The appendix appears normal. 4.  Aortic Atherosclerosis (ICD10-I70.0). 5. These results will be called to the ordering clinician or representative by the Radiologist Assistant, and communication documented in the PACS or [employer] Electronically Signed   By:  WRichardean SaleM.D.   On: 02/08/2022 12:07   UKoreaAbdomen Limited RUQ (LIVER/GB)  Result Date: 01/25/2022 CLINICAL DATA:  Right upper quadrant pain. EXAM: ULTRASOUND ABDOMEN LIMITED RIGHT UPPER QUADRANT COMPARISON:  Abdominal ultrasound 05/06/2020 FINDINGS: Gallbladder: No gallstones or wall thickening visualized. No sonographic Murphy sign noted by sonographer. Common bile duct: Diameter: 6.2 mm Liver: No focal lesion identified. Diffusely heterogeneous with increased echogenicity. Portal vein is patent on color Doppler imaging with normal direction of blood flow towards the liver. Other: None. IMPRESSION: 1. The common bile duct is mildly  dilated measuring 6.2 mm. Recommend correlation with LFTs. If abnormal, recommend further evaluation with MRCP or ERCP. 2. The liver is diffusely heterogeneous with increased echogenicity. This is a nonspecific finding but can be seen with hepatic steatosis and/or other hepatocellular disease. Electronically Signed   By: Ronney Asters M.D.   On: 01/25/2022 19:31   DG Chest 2 View  Result Date: 01/25/2022 CLINICAL DATA:  Chest pain for 1 week, initial encounter EXAM: CHEST - 2 VIEW COMPARISON:  12/19/2020 FINDINGS: Cardiac shadow is within normal limits. Lungs are well aerated bilaterally. No focal infiltrate or effusion is seen. No bony abnormality is noted. IMPRESSION: No active cardiopulmonary disease. Electronically Signed   By: Inez Catalina M.D.   On: 01/25/2022 18:54    Labs:  CBC: Recent Labs    01/25/22 1834 01/30/22 1437 02/05/22 1638 02/19/22 1039  WBC 6.1 6.1 6.5 5.1  HGB 12.0 12.4 12.1 11.9*  HCT 34.8* 37.4 36.2 35.3*  PLT 252 273.0 258.0 257    COAGS: No results for input(s): "INR", "APTT" in the last 8760 hours.  BMP: Recent Labs    01/25/22 1834 01/30/22 1437 02/05/22 1638 02/19/22 1039  NA 140 140 141 141  K 3.4* 3.9 3.9 3.4*  CL 106 102 103 103  CO2 '27 27 29 28  '$ GLUCOSE 93 96 99 102*  BUN '9 15 8 9  '$ CALCIUM 9.4 9.8 9.9 10.0   CREATININE 0.55 0.75 0.65 0.65  GFRNONAA >60  --   --  >60    LIVER FUNCTION TESTS: Recent Labs    01/22/22 1508 01/25/22 1834 02/05/22 1638 02/19/22 1039  BILITOT 0.6 0.4 0.5 0.7  AST '21 25 17 20  '$ ALT '16 21 14 14  '$ ALKPHOS 94 90 97 96  PROT 7.5 7.5 7.6 7.5  ALBUMIN 4.1 3.7 4.1 4.3    TUMOR MARKERS: No results for input(s): "AFPTM", "CEA", "CA199", "CHROMGRNA" in the last 8760 hours.  Assessment and Plan:  Abd pain ; abnormal imaging Revealing pancreatic mass; liver metastasis; lymphadenopathy Scheduled for liver lesion biopsy Risks and benefits of liver lesion biopsy was discussed with the patient and/or patient's family including, but not limited to bleeding, infection, damage to adjacent structures or low yield requiring additional tests.  All of the questions were answered and there is agreement to proceed.  Consent signed and in chart.    Thank you for this interesting consult.  I greatly enjoyed meeting [place] [employer] and look forward to participating in their care.  A copy of this report was sent to the requesting provider on this date.  Electronically Signed: Lavonia Drafts, PA-C 02/20/2022, 11:27 AM   I spent a total of  30 Minutes   in face to face in clinical consultation, greater than 50% of which was counseling/coordinating care for liver lesion biopsy

## 2022-02-20 NOTE — Progress Notes (Signed)
Patient and sister was given discharge information. Both verbalized understanding.

## 2022-02-20 NOTE — Progress Notes (Signed)
This navigator was out of the office for patient's new patient appointment.   At this time, tissue diagnosis is required for further treatment planning. Patient was initially scheduled for liver biopsy on 02/27/22 but Dr [name] was able to have this appointment moved to today.   Will follow for path results.   Oncology Nurse Navigator Documentation     02/20/2022    1:00 PM  Oncology Nurse Navigator Flowsheets  Navigator Follow Up Date: 02/23/2022  Navigator Follow Up Reason: Pathology  Navigator Location [facility]  Navigator Encounter Type Appt/Treatment Plan Review  Patient Visit Type MedOnc  Treatment Phase Abnormal Scans  Barriers/Navigation Needs Coordination of Care;Education  Interventions None Required  Acuity Level 2-Minimal Needs (1-2 Barriers Identified)  Support Groups/Services Friends and Family  Time Spent with Patient 15

## 2022-02-20 NOTE — Procedures (Signed)
Pre Procedure Dx: Liver lesions Post Procedural Dx: Same  Technically successful US guided biopsy of indeterminate lesion within the right lobe of the liver  EBL: None  No immediate complications.   Jay Isabellah Sobocinski, MD Pager #: [phone]    

## 2022-02-21 LAB — CANCER ANTIGEN 19-9: CA 19-9: 113 U/mL — ABNORMAL HIGH (ref 0–35)

## 2022-02-21 NOTE — Telephone Encounter (Signed)
Received. Placed in folder for completion

## 2022-02-22 ENCOUNTER — Other Ambulatory Visit: Payer: BC Managed Care – PPO

## 2022-02-22 LAB — SURGICAL PATHOLOGY

## 2022-02-23 ENCOUNTER — Telehealth: Payer: Self-pay | Admitting: Genetic Counselor

## 2022-02-23 ENCOUNTER — Encounter: Payer: Self-pay | Admitting: *Deleted

## 2022-02-23 DIAGNOSIS — K8689 Other specified diseases of pancreas: Secondary | ICD-10-CM

## 2022-02-23 NOTE — Telephone Encounter (Signed)
Scheduled appointment per 11/01. Patient is aware of appointment date and time. Patient is aware to arrive 15 mins prior to appointment time and to bring updated insurance cards. Patient is aware of location.   

## 2022-02-23 NOTE — Progress Notes (Signed)
Reviewed pathology with Dr [name]. Per Dr [name], request for [facility] testing sent on specimen [phone] DOS 02/20/2022.  Dr [name] would like to see patient next week to discuss treatment. She will need a port. Appointment made. Order for port placed. Referral orders for supportive services also placed.   Called and spoke to patient. She is aware of appointment date, time and location. I also reviewed port order with her. Explained it's purpose and that IR would call to schedule. Reviewed the nutrition and social work consult. Discussed the purpose of the genetic referral and she agreed. Order placed.   Confirmed that patient had my contact information. She is encouraged to call with any questions or concerns.   Oncology Nurse Navigator Documentation     02/23/2022    8:45 AM  Oncology Nurse Navigator Flowsheets  Confirmed Diagnosis Date 02/20/2022  Diagnosis Status Pending Molecular Studies  Navigator Follow Up Date: 02/28/2022  Navigator Follow Up Reason: Follow-up Appointment  Navigator Location [facility]  Navigator Encounter Type Molecular Studies;Telephone  Telephone Appt Confirmation/Clarification;Diagnostic Results;Education;Outgoing Call  Patient Visit Type MedOnc  Treatment Phase Pre-Tx/Tx Discussion  Barriers/Navigation Needs Coordination of Care;Education  Education Pain/ Symptom Management;Preparing for Upcoming Surgery/ Treatment;Other  Interventions Coordination of Care;Education;Psycho-Social Support;Referrals  Acuity Level 2-Minimal Needs (1-2 Barriers Identified)  Referrals Genetics;Nutrition/dietician;Social Work  Education Method [employer]  Support Groups/Services Friends and Family  Time Spent with Patient 72

## 2022-02-26 ENCOUNTER — Inpatient Hospital Stay: Payer: BC Managed Care – PPO | Attending: Hematology & Oncology

## 2022-02-26 NOTE — Progress Notes (Signed)
[place] Work  Clinical Social Work was referred by Art therapist for assessment of psychosocial needs.  Clinical Social Worker contacted patient by phone  to offer support and assess for needs.    Patient reported her basic needs being met at this time.  She continues working, but is doing so remotely.  She is receiving a port next week and is meeting with Dr. [name] on Wednesday.  Informed her CSW would be at that location also and the plan is to meet.  Introduced the [employer] to patient.  No other issues expressed.     Margaree Mackintosh, LCSW  Clinical Social Worker [facility]

## 2022-02-27 ENCOUNTER — Other Ambulatory Visit: Payer: Self-pay | Admitting: *Deleted

## 2022-02-27 ENCOUNTER — Ambulatory Visit (HOSPITAL_COMMUNITY): Payer: BC Managed Care – PPO

## 2022-02-27 DIAGNOSIS — K769 Liver disease, unspecified: Secondary | ICD-10-CM

## 2022-02-27 DIAGNOSIS — K8689 Other specified diseases of pancreas: Secondary | ICD-10-CM

## 2022-02-28 ENCOUNTER — Encounter: Payer: Self-pay | Admitting: Hematology & Oncology

## 2022-02-28 ENCOUNTER — Other Ambulatory Visit: Payer: Self-pay | Admitting: Genetic Counselor

## 2022-02-28 ENCOUNTER — Inpatient Hospital Stay: Payer: BC Managed Care – PPO

## 2022-02-28 ENCOUNTER — Other Ambulatory Visit: Payer: Self-pay

## 2022-02-28 ENCOUNTER — Inpatient Hospital Stay (HOSPITAL_BASED_OUTPATIENT_CLINIC_OR_DEPARTMENT_OTHER): Payer: BC Managed Care – PPO | Admitting: Hematology & Oncology

## 2022-02-28 ENCOUNTER — Encounter: Payer: Self-pay | Admitting: *Deleted

## 2022-02-28 ENCOUNTER — Inpatient Hospital Stay: Payer: BC Managed Care – PPO | Admitting: Licensed Clinical Social Worker

## 2022-02-28 VITALS — BP 129/94 | HR 109 | Temp 98.8°F | Resp 18 | Ht 65.0 in | Wt 198.0 lb

## 2022-02-28 DIAGNOSIS — K8689 Other specified diseases of pancreas: Secondary | ICD-10-CM

## 2022-02-28 DIAGNOSIS — C259 Malignant neoplasm of pancreas, unspecified: Secondary | ICD-10-CM | POA: Diagnosis not present

## 2022-02-28 DIAGNOSIS — C787 Secondary malignant neoplasm of liver and intrahepatic bile duct: Secondary | ICD-10-CM | POA: Insufficient documentation

## 2022-02-28 DIAGNOSIS — Z79899 Other long term (current) drug therapy: Secondary | ICD-10-CM | POA: Insufficient documentation

## 2022-02-28 DIAGNOSIS — K769 Liver disease, unspecified: Secondary | ICD-10-CM

## 2022-02-28 LAB — CBC WITH DIFFERENTIAL (CANCER CENTER ONLY)
Abs Immature Granulocytes: 0.02 10*3/uL (ref 0.00–0.07)
Basophils Absolute: 0.1 10*3/uL (ref 0.0–0.1)
Basophils Relative: 1 %
Eosinophils Absolute: 0.2 10*3/uL (ref 0.0–0.5)
Eosinophils Relative: 2 %
HCT: 35.7 % — ABNORMAL LOW (ref 36.0–46.0)
Hemoglobin: 11.8 g/dL — ABNORMAL LOW (ref 12.0–15.0)
Immature Granulocytes: 0 %
Lymphocytes Relative: 33 %
Lymphs Abs: 2.1 10*3/uL (ref 0.7–4.0)
MCH: 31.3 pg (ref 26.0–34.0)
MCHC: 33.1 g/dL (ref 30.0–36.0)
MCV: 94.7 fL (ref 80.0–100.0)
Monocytes Absolute: 0.5 10*3/uL (ref 0.1–1.0)
Monocytes Relative: 8 %
Neutro Abs: 3.6 10*3/uL (ref 1.7–7.7)
Neutrophils Relative %: 56 %
Platelet Count: 272 10*3/uL (ref 150–400)
RBC: 3.77 MIL/uL — ABNORMAL LOW (ref 3.87–5.11)
RDW: 12.4 % (ref 11.5–15.5)
WBC Count: 6.4 10*3/uL (ref 4.0–10.5)
nRBC: 0 % (ref 0.0–0.2)

## 2022-02-28 LAB — CMP (CANCER CENTER ONLY)
ALT: 27 U/L (ref 0–44)
AST: 31 U/L (ref 15–41)
Albumin: 4.4 g/dL (ref 3.5–5.0)
Alkaline Phosphatase: 125 U/L (ref 38–126)
Anion gap: 10 (ref 5–15)
BUN: 12 mg/dL (ref 6–20)
CO2: 28 mmol/L (ref 22–32)
Calcium: 10.3 mg/dL (ref 8.9–10.3)
Chloride: 102 mmol/L (ref 98–111)
Creatinine: 0.73 mg/dL (ref 0.44–1.00)
GFR, Estimated: >60 mL/min (ref >60)
Glucose, Bld: 127 mg/dL — ABNORMAL HIGH (ref 70–99)
Potassium: 4 mmol/L (ref 3.5–5.1)
Sodium: 140 mmol/L (ref 135–145)
Total Bilirubin: 0.6 mg/dL (ref 0.3–1.2)
Total Protein: 8.2 g/dL — ABNORMAL HIGH (ref 6.5–8.1)

## 2022-02-28 LAB — GENETIC SCREENING ORDER

## 2022-02-28 MED ORDER — TEMAZEPAM 15 MG PO CAPS: 15.0000 mg | ORAL_CAPSULE | Freq: Every evening | ORAL | 0 refills | Status: DC | PRN

## 2022-02-28 MED ORDER — OXYCODONE HCL 7.5 MG PO TABS: 7.5000 mg | ORAL_TABLET | Freq: Four times a day (QID) | ORAL | 0 refills | Status: DC | PRN

## 2022-02-28 NOTE — Telephone Encounter (Signed)
Form completed and faxed. Copy sent to scan and copy sent to patient.

## 2022-02-28 NOTE — Progress Notes (Signed)
This navigator was out of the office for her new patient visit.   Initial RN Navigator Patient Visit  Name: Makayla Good Diagnosis: Pancreatic Cancer  Met with patient prior to their visit with MD. Makayla Good patient "Your Patient Navigator" handout which explains my role, areas in which I am able to help, and all the contact information for myself and the office. Also gave patient MD and Navigator business card. Reviewed with patient the general overview of expected course after initial diagnosis and time frame for all steps to be completed.  New patient packet given to patient which includes: orientation to office and staff; campus directory; education on My Chart and Advance Directives; and patient centered education on pancreatic cancer.  Patient comes in alone. She lives with her husband, sister and teen child. Her husband has had a stroke in the past, so he is not a support to her, but her sister has moved in and is very helpful. She also a network of friends and other family and feels like she is well supported.   Patient works full time at [employer]. She currently works a hybrid schedule and will be seeking accomodations to work from home full time. Explained the FMLA process to the patient and she understands.   Patient is already scheduled for port placement. Will follow up with patient once orders are placed and note dictated.   Patient completed visit with Dr. [name].   Patient understands all follow up procedures and expectations. They have my number to reach out for any further clarification or additional needs.    Oncology Nurse Navigator Documentation     02/28/2022    2:00 PM  Oncology Nurse Navigator Flowsheets  Navigator Follow Up Date: 03/01/2022  Navigator Follow Up Reason: Appointment Review  Navigator Location [facility]  Navigator Encounter Type Follow-up Appt;Appt/Treatment Plan Review  Patient Visit Type MedOnc  Treatment Phase Pre-Tx/Tx  Discussion  Barriers/Navigation Needs Coordination of Care;Education  Education Newly Diagnosed Cancer Education;Pain/ Symptom Management;Preparing for Upcoming Surgery/ Treatment  Interventions Coordination of Care;Education;Psycho-Social Support  Acuity Level 2-Minimal Needs (1-2 Barriers Identified)  Education Method Verbal;Written  Support Groups/Services Friends and Family  Time Spent with Patient 30

## 2022-02-28 NOTE — Progress Notes (Signed)
[place] Work  Holiday representative met with patient  to offer support and assess for needs.    CSW provided patient with literature on the [employer], Frankey Poot and [employer] and the [employer].  Patient expressed having several family members and friends who are of great support to her.  "I've been the strong one."  She is concerned about her children and how they are adjusting to her illness.  CSW offered adjustment counseling to patient and her family as needed.  She complained of pain and how it is causing loss of sleep.  She will address with MD.     Makayla Mackintosh, LCSW  Clinical Social Worker [facility]

## 2022-03-01 ENCOUNTER — Encounter: Payer: Self-pay | Admitting: Hematology & Oncology

## 2022-03-01 ENCOUNTER — Other Ambulatory Visit: Payer: Self-pay

## 2022-03-01 DIAGNOSIS — C259 Malignant neoplasm of pancreas, unspecified: Secondary | ICD-10-CM

## 2022-03-01 DIAGNOSIS — K8689 Other specified diseases of pancreas: Secondary | ICD-10-CM

## 2022-03-01 HISTORY — DX: Malignant neoplasm of pancreas, unspecified: C25.9

## 2022-03-01 MED ORDER — OXYCODONE HCL 5 MG PO TABS: 5.0000 mg | ORAL_TABLET | Freq: Four times a day (QID) | ORAL | 0 refills | Status: DC | PRN

## 2022-03-01 NOTE — Progress Notes (Signed)
START ON PATHWAY REGIMEN - Pancreatic Adenocarcinoma     A cycle is every 14 days:     Irinotecan      Oxaliplatin      Leucovorin      Fluorouracil   **Always confirm dose/schedule in your pharmacy ordering system**  Patient Characteristics: Metastatic Disease, First Line, PS = 0,1, BRCA1/2 and PALB2  Mutation Absent/Unknown Therapeutic Status: Metastatic Disease Line of Therapy: First Line ECOG Performance Status: 0 BRCA1/2 Mutation Status: Awaiting Test Results PALB2 Mutation Status: Awaiting Test Results Intent of Therapy: Non-Curative / Palliative Intent, Discussed with Patient

## 2022-03-01 NOTE — Progress Notes (Signed)
Hematology and Oncology Follow Up Visit  Makayla Good [id number] 1971/01/26 51 y.o. 03/01/2022   Principle Diagnosis:  Metastatic adenocarcinoma of the pancreas-liver metastasis  Current Therapy:   FOLFIRINOX -start cycle 1 on 03/19/2022     Interim History:  Makayla Good is back for follow-up.  We saw Makayla Good back on 02/19/2022.  We did go ahead and get a biopsy of a liver metastasis.  This was done on 02/20/2022.  The pathology report ([id number]) showed metastatic adenocarcinoma consistent with pancreatic primary.  We did send off molecular analysis.  I do not have these results back yet.  Makayla Good still is looking pretty good.  Makayla Good is having some pain issues.  The hydrocodone is really not helping much.  I will go ahead and put Makayla Good on oxycodone (5 mg p.o. every 6 hours as needed).  Makayla Good also says that Makayla Good is having a tough time sleeping.  We will try Makayla Good on some Restoril (15 mg p.o. nightly as needed).  There is no problems with bowels or bladder.  Makayla Good has had no bleeding.  Makayla Good has had a little nausea but no vomiting.  There has been no cough or shortness of breath.  Of note, Makayla Good had a chest CT scan on 02/15/2022.  This did not show any obvious metastatic disease to the thoracic cavity.  Makayla Good has had no leg swelling.  There has been no rashes.  Makayla Good has had no headache.  Overall, I would say performance status is ECOG 1.  Medications:  Current Outpatient Medications:    temazepam (RESTORIL) 15 MG capsule, Take 1 capsule (15 mg total) by mouth at bedtime as needed for sleep., Disp: 30 capsule, Rfl: 0   amLODipine (NORVASC) 5 MG tablet, TAKE 1 TABLET (5 MG TOTAL) BY MOUTH DAILY., Disp: 90 tablet, Rfl: 1   cyclobenzaprine (FLEXERIL) 10 MG tablet, Take 1 tablet (10 mg total) by mouth at bedtime as needed for muscle spasms., Disp: 30 tablet, Rfl: 0   fluticasone (FLONASE) 50 MCG/ACT nasal spray, SPRAY 2 SPRAYS INTO EACH NOSTRIL EVERY DAY (Patient taking differently: Place 2  sprays into both nostrils daily as needed for allergies.), Disp: 48 mL, Rfl: 2   hydrochlorothiazide (HYDRODIURIL) 25 MG tablet, Take 1 tablet (25 mg total) by mouth daily., Disp: 90 tablet, Rfl: 1   Melatonin 10 MG TABS, Take 10 mg by mouth at bedtime as needed (sleep)., Disp: , Rfl:    Multiple Vitamins-Minerals (CELEBRATE MULTI-COMPLETE 36 PO), Take 1 each by mouth daily., Disp: , Rfl:    nicotine (NICODERM CQ - DOSED IN MG/24 HOURS) 14 mg/24hr patch, Place 1 patch (14 mg total) onto the skin daily. (Patient not taking: Reported on 02/19/2022), Disp: 28 patch, Rfl: 1   nicotine (NICODERM CQ) 7 mg/24hr patch, Place 1 patch (7 mg total) onto the skin daily. (Patient not taking: Reported on 02/19/2022), Disp: 28 patch, Rfl: 0   nicotine polacrilex (NICORETTE) 4 MG gum, Take 4 mg by mouth as needed for smoking cessation., Disp: , Rfl:    omeprazole (PRILOSEC) 40 MG capsule, Take 1 capsule (40 mg total) by mouth daily. (Patient taking differently: Take 40 mg by mouth daily as needed (acid reflux).), Disp: 30 capsule, Rfl: 0   oxyCODONE (OXY IR/ROXICODONE) 5 MG immediate release tablet, Take 1 tablet (5 mg total) by mouth every 6 (six) hours as needed for severe pain., Disp: 90 tablet, Rfl: 0   potassium chloride (KLOR-CON) 10 MEQ tablet, TAKE 1 TABLET BY MOUTH EVERY  DAY, Disp: 90 tablet, Rfl: 1  Allergies: No Known Allergies  Past Medical History, Surgical history, Social history, and Family History were reviewed and updated.  Review of Systems: Review of Systems  Constitutional: Negative.   HENT:  Negative.    Eyes: Negative.   Respiratory: Negative.    Cardiovascular: Negative.   Gastrointestinal:  Positive for abdominal pain and nausea.  Endocrine: Negative.   Genitourinary: Negative.    Musculoskeletal: Negative.   Skin: Negative.   Neurological: Negative.   Hematological: Negative.   Psychiatric/Behavioral: Negative.      Physical Exam:  height is '5\' 5"'$  (1.651 m) and weight is 198  lb (89.8 kg). Makayla Good oral temperature is 98.8 F (37.1 C). Makayla Good blood pressure is 129/94 (abnormal) and Makayla Good pulse is 109 (abnormal). Makayla Good respiration is 18 and oxygen saturation is 100%.   Wt Readings from Last 3 Encounters:  02/28/22 198 lb (89.8 kg)  02/20/22 200 lb (90.7 kg)  02/19/22 200 lb 0.6 oz (90.7 kg)    Physical Exam Vitals reviewed.  HENT:     Head: Normocephalic and atraumatic.  Eyes:     Pupils: Pupils are equal, round, and reactive to light.  Cardiovascular:     Rate and Rhythm: Normal rate and regular rhythm.     Heart sounds: Normal heart sounds.  Pulmonary:     Effort: Pulmonary effort is normal.     Breath sounds: Normal breath sounds.  Abdominal:     General: Bowel sounds are normal.     Palpations: Abdomen is soft.  Musculoskeletal:        General: No tenderness or deformity. Normal range of motion.     Cervical back: Normal range of motion.  Lymphadenopathy:     Cervical: No cervical adenopathy.  Skin:    General: Skin is warm and dry.     Findings: No erythema or rash.  Neurological:     Mental Status: Makayla Good is alert and oriented to person, place, and time.  Psychiatric:        Behavior: Behavior normal.        Thought Content: Thought content normal.        Judgment: Judgment normal.     Lab Results  Component Value Date   WBC 6.4 02/28/2022   HGB 11.8 (L) 02/28/2022   HCT 35.7 (L) 02/28/2022   MCV 94.7 02/28/2022   PLT 272 02/28/2022     Chemistry      Component Value Date/Time   NA 140 02/28/2022 1343   K 4.0 02/28/2022 1343   CL 102 02/28/2022 1343   CO2 28 02/28/2022 1343   BUN 12 02/28/2022 1343   CREATININE 0.73 02/28/2022 1343   CREATININE 0.79 12/08/2021 1420      Component Value Date/Time   CALCIUM 10.3 02/28/2022 1343   ALKPHOS 125 02/28/2022 1343   AST 31 02/28/2022 1343   ALT 27 02/28/2022 1343   BILITOT 0.6 02/28/2022 1343      Impression and Plan: Makayla Good is a very nice 51 year old African-American female.  Makayla Good  has metastatic pancreatic cancer.  This is an adenocarcinoma.  Surprisingly, Makayla Good initial CA 19 I have not was not all that elevated at 113.  We will be interesting to see what the molecular analysis shows.  Makayla Good is in good shape right now.  Makayla Good has a good performance status.  As such, we can clearly be aggressive.  As such, I think that we can use FOLFIRINOX with Makayla Good.  Makayla Good  will have a Port-A-Cath placed in a week or so.  I went over toxicity with Makayla Good.  We will will make sure that Makayla Good goes to the chemotherapy education class.  I would like to think that Makayla Good is to have a good response to FOLFIRINOX.  Again the CA 19-9 can be a marker for [place].  Overall, I think the radiographic response will be the standard that we will have to use.  Again Makayla Good knows that this is stage IV.  Makayla Good knows that this is some that we can treat but not cure.  I told Makayla Good that if treatment worked, then we can hopefully get Makayla Good a year to.  If treatment does not work, then I think we probably looking at less than 6-8 months.  Again Makayla Good is in very good shape.  Makayla Good, thankfully, has a good support.  As such, we can definitely be aggressive.  We will get started right after Thanksgiving.  I would like for Makayla Good to enjoy things given with Makayla Good family without having to have any side effects from treatment.  I think that we have the flexibility to wait a couple weeks.  I will plan to see Makayla Good back when Makayla Good starts Makayla Good second cycle of treatment.  We will do 4 cycles of treatment and then reevaluate Makayla Good with CT scans.   Volanda Napoleon, MD 11/9/20234:42 PM

## 2022-03-02 ENCOUNTER — Other Ambulatory Visit: Payer: Self-pay

## 2022-03-02 ENCOUNTER — Telehealth: Payer: Self-pay | Admitting: Genetic Counselor

## 2022-03-02 ENCOUNTER — Encounter: Payer: Self-pay | Admitting: *Deleted

## 2022-03-02 NOTE — Progress Notes (Signed)
Patient needs chemo education. Scheduled for 03/13/22.  Called and spoke to patient. She is aware of chemo education appointment including date, time and location.   We reviewed her prn pain medication. Directions for use and possible side effects. She hasn't yet picked it up from the pharmacy. Encouraged her to call her pharmacy and see if it was ready. She knows to stop the Norco when she starts the OxyIR.   Oncology Nurse Navigator Documentation     03/02/2022   10:00 AM  Oncology Nurse Navigator Flowsheets  Navigator Follow Up Date: 03/13/2022  Navigator Follow Up Reason: Chemo Class  Navigator Location [facility]  Navigator Encounter Type Telephone  Telephone Appt Confirmation/Clarification;Education;Outgoing Call  Patient Visit Type MedOnc  Treatment Phase Pre-Tx/Tx Discussion  Barriers/Navigation Needs Coordination of Care;Education  Education Pain/ Symptom Management;Other  Interventions Coordination of Care;Education;Psycho-Social Support  Acuity Level 2-Minimal Needs (1-2 Barriers Identified)  Coordination of Care Appts  Education Method Verbal  Support Groups/Services Friends and Family  Time Spent with Patient 15

## 2022-03-02 NOTE — Telephone Encounter (Signed)
TRF submitted to [facility] for Multi-Cancer Panel + RNA and pancreatitis genes.

## 2022-03-05 ENCOUNTER — Emergency Department (HOSPITAL_COMMUNITY): Payer: BC Managed Care – PPO

## 2022-03-05 ENCOUNTER — Other Ambulatory Visit: Payer: Self-pay

## 2022-03-05 ENCOUNTER — Ambulatory Visit
Admission: RE | Admit: 2022-03-05 | Discharge: 2022-03-05 | Disposition: A | Discharge status: Home / Self Care | Payer: BC Managed Care – PPO | Source: Ambulatory Visit | Attending: Radiation Oncology | Admitting: Radiation Oncology

## 2022-03-05 ENCOUNTER — Inpatient Hospital Stay (HOSPITAL_COMMUNITY)
Admission: EM | Admit: 2022-03-05 | Discharge: 2022-03-08 | DRG: 516 | Disposition: A | Discharge status: Home / Self Care | Payer: BC Managed Care – PPO | Attending: Internal Medicine | Admitting: Internal Medicine

## 2022-03-05 ENCOUNTER — Encounter (HOSPITAL_COMMUNITY): Payer: Self-pay

## 2022-03-05 ENCOUNTER — Ambulatory Visit
Admit: 2022-03-05 | Discharge: 2022-03-05 | Disposition: A | Discharge status: Home / Self Care | Payer: BC Managed Care – PPO | Attending: Radiation Oncology | Admitting: Radiation Oncology

## 2022-03-05 DIAGNOSIS — Z8261 Family history of arthritis: Secondary | ICD-10-CM

## 2022-03-05 DIAGNOSIS — M47816 Spondylosis without myelopathy or radiculopathy, lumbar region: Secondary | ICD-10-CM | POA: Diagnosis not present

## 2022-03-05 DIAGNOSIS — R202 Paresthesia of skin: Secondary | ICD-10-CM | POA: Diagnosis present

## 2022-03-05 DIAGNOSIS — Z9884 Bariatric surgery status: Secondary | ICD-10-CM

## 2022-03-05 DIAGNOSIS — G992 Myelopathy in diseases classified elsewhere: Secondary | ICD-10-CM | POA: Diagnosis present

## 2022-03-05 DIAGNOSIS — C787 Secondary malignant neoplasm of liver and intrahepatic bile duct: Secondary | ICD-10-CM | POA: Diagnosis not present

## 2022-03-05 DIAGNOSIS — G893 Neoplasm related pain (acute) (chronic): Secondary | ICD-10-CM | POA: Diagnosis not present

## 2022-03-05 DIAGNOSIS — M4804 Spinal stenosis, thoracic region: Secondary | ICD-10-CM | POA: Diagnosis not present

## 2022-03-05 DIAGNOSIS — I10 Essential (primary) hypertension: Secondary | ICD-10-CM | POA: Diagnosis present

## 2022-03-05 DIAGNOSIS — C259 Malignant neoplasm of pancreas, unspecified: Secondary | ICD-10-CM | POA: Diagnosis present

## 2022-03-05 DIAGNOSIS — M546 Pain in thoracic spine: Secondary | ICD-10-CM | POA: Diagnosis present

## 2022-03-05 DIAGNOSIS — C7951 Secondary malignant neoplasm of bone: Secondary | ICD-10-CM

## 2022-03-05 DIAGNOSIS — J45909 Unspecified asthma, uncomplicated: Secondary | ICD-10-CM | POA: Diagnosis not present

## 2022-03-05 DIAGNOSIS — Z8249 Family history of ischemic heart disease and other diseases of the circulatory system: Secondary | ICD-10-CM | POA: Diagnosis not present

## 2022-03-05 DIAGNOSIS — M25512 Pain in left shoulder: Secondary | ICD-10-CM | POA: Diagnosis present

## 2022-03-05 DIAGNOSIS — Z90721 Acquired absence of ovaries, unilateral: Secondary | ICD-10-CM

## 2022-03-05 DIAGNOSIS — Z87891 Personal history of nicotine dependence: Secondary | ICD-10-CM | POA: Diagnosis not present

## 2022-03-05 DIAGNOSIS — C253 Malignant neoplasm of pancreatic duct: Secondary | ICD-10-CM

## 2022-03-05 DIAGNOSIS — M79605 Pain in left leg: Secondary | ICD-10-CM | POA: Diagnosis present

## 2022-03-05 DIAGNOSIS — G4700 Insomnia, unspecified: Secondary | ICD-10-CM | POA: Diagnosis present

## 2022-03-05 DIAGNOSIS — E669 Obesity, unspecified: Secondary | ICD-10-CM | POA: Diagnosis present

## 2022-03-05 DIAGNOSIS — E785 Hyperlipidemia, unspecified: Secondary | ICD-10-CM | POA: Diagnosis present

## 2022-03-05 DIAGNOSIS — K8689 Other specified diseases of pancreas: Secondary | ICD-10-CM

## 2022-03-05 DIAGNOSIS — C799 Secondary malignant neoplasm of unspecified site: Secondary | ICD-10-CM | POA: Diagnosis not present

## 2022-03-05 DIAGNOSIS — M8448XA Pathological fracture, other site, initial encounter for fracture: Secondary | ICD-10-CM | POA: Diagnosis not present

## 2022-03-05 DIAGNOSIS — Z515 Encounter for palliative care: Secondary | ICD-10-CM

## 2022-03-05 DIAGNOSIS — E282 Polycystic ovarian syndrome: Secondary | ICD-10-CM | POA: Diagnosis present

## 2022-03-05 DIAGNOSIS — Z79899 Other long term (current) drug therapy: Secondary | ICD-10-CM

## 2022-03-05 DIAGNOSIS — Z452 Encounter for adjustment and management of vascular access device: Secondary | ICD-10-CM | POA: Diagnosis not present

## 2022-03-05 DIAGNOSIS — M79604 Pain in right leg: Secondary | ICD-10-CM | POA: Insufficient documentation

## 2022-03-05 DIAGNOSIS — G4733 Obstructive sleep apnea (adult) (pediatric): Secondary | ICD-10-CM | POA: Diagnosis present

## 2022-03-05 DIAGNOSIS — Z6832 Body mass index (BMI) 32.0-32.9, adult: Secondary | ICD-10-CM

## 2022-03-05 DIAGNOSIS — Z820 Family history of epilepsy and other diseases of the nervous system: Secondary | ICD-10-CM | POA: Diagnosis not present

## 2022-03-05 DIAGNOSIS — M4802 Spinal stenosis, cervical region: Secondary | ICD-10-CM | POA: Diagnosis present

## 2022-03-05 DIAGNOSIS — M40204 Unspecified kyphosis, thoracic region: Secondary | ICD-10-CM | POA: Diagnosis not present

## 2022-03-05 DIAGNOSIS — Z8507 Personal history of malignant neoplasm of pancreas: Secondary | ICD-10-CM | POA: Diagnosis not present

## 2022-03-05 DIAGNOSIS — M4316 Spondylolisthesis, lumbar region: Secondary | ICD-10-CM | POA: Diagnosis not present

## 2022-03-05 DIAGNOSIS — M50221 Other cervical disc displacement at C4-C5 level: Secondary | ICD-10-CM | POA: Diagnosis not present

## 2022-03-05 DIAGNOSIS — R350 Frequency of micturition: Secondary | ICD-10-CM | POA: Diagnosis present

## 2022-03-05 DIAGNOSIS — Z818 Family history of other mental and behavioral disorders: Secondary | ICD-10-CM

## 2022-03-05 DIAGNOSIS — K219 Gastro-esophageal reflux disease without esophagitis: Secondary | ICD-10-CM | POA: Diagnosis present

## 2022-03-05 DIAGNOSIS — Z510 Encounter for antineoplastic radiation therapy: Secondary | ICD-10-CM | POA: Diagnosis not present

## 2022-03-05 DIAGNOSIS — M48061 Spinal stenosis, lumbar region without neurogenic claudication: Secondary | ICD-10-CM | POA: Diagnosis not present

## 2022-03-05 LAB — CBC WITH DIFFERENTIAL/PLATELET
Abs Immature Granulocytes: 0.01 10*3/uL (ref 0.00–0.07)
Basophils Absolute: 0.1 10*3/uL (ref 0.0–0.1)
Basophils Relative: 1 %
Eosinophils Absolute: 0.1 10*3/uL (ref 0.0–0.5)
Eosinophils Relative: 2 %
HCT: 36.7 % (ref 36.0–46.0)
Hemoglobin: 12.2 g/dL (ref 12.0–15.0)
Immature Granulocytes: 0 %
Lymphocytes Relative: 31 %
Lymphs Abs: 2.3 10*3/uL (ref 0.7–4.0)
MCH: 30.9 pg (ref 26.0–34.0)
MCHC: 33.2 g/dL (ref 30.0–36.0)
MCV: 92.9 fL (ref 80.0–100.0)
Monocytes Absolute: 0.7 10*3/uL (ref 0.1–1.0)
Monocytes Relative: 10 %
Neutro Abs: 4.1 10*3/uL (ref 1.7–7.7)
Neutrophils Relative %: 56 %
Platelets: 322 10*3/uL (ref 150–400)
RBC: 3.95 MIL/uL (ref 3.87–5.11)
RDW: 12.4 % (ref 11.5–15.5)
WBC: 7.2 10*3/uL (ref 4.0–10.5)
nRBC: 0 % (ref 0.0–0.2)

## 2022-03-05 LAB — COMPREHENSIVE METABOLIC PANEL
ALT: 29 U/L (ref 0–44)
AST: 30 U/L (ref 15–41)
Albumin: 4 g/dL (ref 3.5–5.0)
Alkaline Phosphatase: 125 U/L (ref 38–126)
Anion gap: 11 (ref 5–15)
BUN: 12 mg/dL (ref 6–20)
CO2: 26 mmol/L (ref 22–32)
Calcium: 10 mg/dL (ref 8.9–10.3)
Chloride: 99 mmol/L (ref 98–111)
Creatinine, Ser: 0.54 mg/dL (ref 0.44–1.00)
GFR, Estimated: >60 mL/min (ref >60)
Glucose, Bld: 124 mg/dL — ABNORMAL HIGH (ref 70–99)
Potassium: 3.5 mmol/L (ref 3.5–5.1)
Sodium: 136 mmol/L (ref 135–145)
Total Bilirubin: 0.9 mg/dL (ref 0.3–1.2)
Total Protein: 8.7 g/dL — ABNORMAL HIGH (ref 6.5–8.1)

## 2022-03-05 LAB — URINALYSIS, ROUTINE W REFLEX MICROSCOPIC
Bilirubin Urine: NEGATIVE
Glucose, UA: NEGATIVE mg/dL
Hgb urine dipstick: NEGATIVE
Ketones, ur: NEGATIVE mg/dL
Nitrite: NEGATIVE
Protein, ur: NEGATIVE mg/dL
Specific Gravity, Urine: 1.016 (ref 1.005–1.030)
pH: 6 (ref 5.0–8.0)

## 2022-03-05 LAB — HIV ANTIBODY (ROUTINE TESTING W REFLEX): HIV Screen 4th Generation wRfx: NONREACTIVE

## 2022-03-05 MED ORDER — ONDANSETRON HCL 4 MG PO TABS: 4.0000 mg | ORAL_TABLET | Freq: Four times a day (QID) | ORAL | Status: DC | PRN

## 2022-03-05 MED ORDER — MORPHINE SULFATE (PF) 4 MG/ML IV SOLN
4.0000 mg | INTRAVENOUS | Status: DC | PRN
  Administered 2022-03-05: 4 mg via INTRAVENOUS
  Filled 2022-03-05 (×2): qty 1

## 2022-03-05 MED ORDER — SODIUM CHLORIDE 0.9 % IV SOLN: 40.0000 mg | Freq: Once | INTRAVENOUS | Status: DC

## 2022-03-05 MED ORDER — ACETAMINOPHEN 325 MG PO TABS: 650.0000 mg | ORAL_TABLET | Freq: Four times a day (QID) | ORAL | Status: DC | PRN

## 2022-03-05 MED ORDER — SODIUM CHLORIDE 0.9 % IV SOLN
20.0000 mg | Freq: Every day | INTRAVENOUS | Status: DC
  Administered 2022-03-06: 20 mg via INTRAVENOUS
  Filled 2022-03-05 (×2): qty 2

## 2022-03-05 MED ORDER — DEXAMETHASONE SODIUM PHOSPHATE 10 MG/ML IJ SOLN: 10.0000 mg | Freq: Once | INTRAMUSCULAR | Status: DC

## 2022-03-05 MED ORDER — DEXAMETHASONE SODIUM PHOSPHATE 10 MG/ML IJ SOLN
INTRAMUSCULAR | Status: AC
  Filled 2022-03-05: qty 3

## 2022-03-05 MED ORDER — ACETAMINOPHEN 650 MG RE SUPP: 650.0000 mg | Freq: Four times a day (QID) | RECTAL | Status: DC | PRN

## 2022-03-05 MED ORDER — DEXAMETHASONE SODIUM PHOSPHATE 10 MG/ML IJ SOLN
40.0000 mg | Freq: Once | INTRAMUSCULAR | Status: AC
  Administered 2022-03-05: 40 mg via INTRAVENOUS
  Filled 2022-03-05: qty 4

## 2022-03-05 MED ORDER — ONDANSETRON HCL 4 MG/2ML IJ SOLN: 4.0000 mg | Freq: Four times a day (QID) | INTRAMUSCULAR | Status: DC | PRN

## 2022-03-05 MED ORDER — DOCUSATE SODIUM 100 MG PO CAPS
100.0000 mg | ORAL_CAPSULE | Freq: Two times a day (BID) | ORAL | Status: DC
  Administered 2022-03-05 – 2022-03-08 (×6): 100 mg via ORAL
  Filled 2022-03-05 (×6): qty 1

## 2022-03-05 MED ORDER — FLUCONAZOLE 100 MG PO TABS: 100.0000 mg | ORAL_TABLET | Freq: Once | ORAL | Status: DC

## 2022-03-05 MED ORDER — FAMOTIDINE IN NACL 20-0.9 MG/50ML-% IV SOLN
20.0000 mg | Freq: Two times a day (BID) | INTRAVENOUS | Status: AC
  Administered 2022-03-05 (×2): 20 mg via INTRAVENOUS
  Filled 2022-03-05 (×2): qty 50

## 2022-03-05 MED ORDER — GADOBUTROL 1 MMOL/ML IV SOLN
9.0000 mL | Freq: Once | INTRAVENOUS | Status: AC | PRN
  Administered 2022-03-05: 9 mL via INTRAVENOUS

## 2022-03-05 MED ORDER — OXYCODONE HCL 5 MG PO TABS
5.0000 mg | ORAL_TABLET | ORAL | Status: DC | PRN
  Administered 2022-03-06 (×2): 5 mg via ORAL
  Filled 2022-03-05 (×3): qty 1

## 2022-03-05 MED ORDER — MORPHINE SULFATE (PF) 4 MG/ML IV SOLN
4.0000 mg | Freq: Once | INTRAVENOUS | Status: AC
  Administered 2022-03-05: 4 mg via INTRAVENOUS
  Filled 2022-03-05: qty 1

## 2022-03-05 MED ORDER — DEXAMETHASONE SODIUM PHOSPHATE 10 MG/ML IJ SOLN: 20.0000 mg | Freq: Every day | INTRAMUSCULAR | Status: DC

## 2022-03-05 MED ORDER — MORPHINE SULFATE (PF) 4 MG/ML IV SOLN
4.0000 mg | INTRAVENOUS | Status: DC | PRN
  Administered 2022-03-05 – 2022-03-08 (×11): 4 mg via INTRAVENOUS
  Filled 2022-03-05 (×10): qty 1

## 2022-03-05 NOTE — ED Notes (Signed)
Patient transported to radiation.

## 2022-03-05 NOTE — ED Provider Notes (Signed)
[facility] DEPT Provider Note   CSN: [id number] Arrival date & time: 03/05/22  0411     History  Chief Complaint  Patient presents with   Leg Pain    Makayla Good is a 51 y.o. female.  The history is provided by the patient and medical records.  Leg Pain Makayla Good is a 51 y.o. female who presents to the Emergency Department complaining of leg pain.  She is also in the emergency department for evaluation of bilateral leg pain that started on Saturday.  No reports of recent injuries.  She describes it as a throbbing toothache type pain that is located throughout the entirety of both legs but is greatest in her thighs.  She thinks this is related to taking oxycodone.  She was recently diagnosed with metastatic cancer about 1 month ago.  She also reports some lower back pain as well.  She does have a history of prior lumbar surgery.  She also reports urinary frequency but no dysuria.  No bowel or bladder incontinence.  No numbness, weakness.  She does have worsening leg pain and fatiguing with attempts to ambulate.  She also reports tingling in her fingers, which has been an ongoing issue.  Has a history of hypertension.     Home Medications Prior to Admission medications   Medication Sig Start Date End Date Taking? Authorizing Provider  amLODipine (NORVASC) 5 MG tablet TAKE 1 TABLET (5 MG TOTAL) BY MOUTH DAILY. 11/07/21   [name], DO  cyclobenzaprine (FLEXERIL) 10 MG tablet Take 1 tablet (10 mg total) by mouth at bedtime as needed for muscle spasms. 01/25/22   Lorine Bears, NP  fluticasone (FLONASE) 50 MCG/ACT nasal spray SPRAY 2 SPRAYS INTO EACH NOSTRIL EVERY DAY Patient taking differently: Place 2 sprays into both nostrils daily as needed for allergies. 05/11/21   [name], DO  hydrochlorothiazide (HYDRODIURIL) 25 MG tablet Take 1 tablet (25 mg total) by mouth daily. 12/29/21   [name], DO  Melatonin 10 MG TABS Take 10 mg by mouth at bedtime as needed (sleep).    [provider]  Multiple Vitamins-Minerals (CELEBRATE MULTI-COMPLETE 36 PO) Take 1 each by mouth daily.    [provider]  nicotine (NICODERM CQ - DOSED IN MG/24 HOURS) 14 mg/24hr patch Place 1 patch (14 mg total) onto the skin daily. Patient not taking: Reported on 02/19/2022 12/08/21   Carollee Herter, Alferd Apa, DO  nicotine (NICODERM CQ) 7 mg/24hr patch Place 1 patch (7 mg total) onto the skin daily. Patient not taking: Reported on 02/19/2022 12/08/21   [name], DO  nicotine polacrilex (NICORETTE) 4 MG gum Take 4 mg by mouth as needed for smoking cessation.    [provider]  omeprazole (PRILOSEC) 40 MG capsule Take 1 capsule (40 mg total) by mouth daily. Patient taking differently: Take 40 mg by mouth daily as needed (acid reflux). 01/22/22   Martinique, Betty G, MD  oxyCODONE (OXY IR/ROXICODONE) 5 MG immediate release tablet Take 1 tablet (5 mg total) by mouth every 6 (six) hours as needed for severe pain. 03/01/22   Volanda Napoleon, MD  potassium chloride (KLOR-CON) 10 MEQ tablet TAKE 1 TABLET BY MOUTH EVERY DAY 10/11/21   Carollee Herter, Yvonne R, DO  temazepam (RESTORIL) 15 MG capsule Take 1 capsule (15 mg total) by mouth at bedtime as needed for sleep. 02/28/22   Volanda Napoleon,  MD      Allergies    Patient has no known allergies.    Review of Systems   Review of Systems  All other systems reviewed and are negative.   Physical Exam Updated Vital Signs BP (!) 153/103 (BP Location: Right Arm)   Pulse 97   Temp 98.2 F (36.8 C) (Oral)   Resp 18   Ht '5\' 5"'$  (1.651 m)   Wt 89.8 kg   LMP 07/27/2020 (Within Days)   SpO2 100%   BMI 32.95 kg/m  Physical Exam Vitals and nursing note reviewed.  Constitutional:      Appearance: She is well-developed.  HENT:     Head: Normocephalic and atraumatic.  Cardiovascular:     Rate and Rhythm: Normal rate and regular  rhythm.  Pulmonary:     Effort: Pulmonary effort is normal. No respiratory distress.  Abdominal:     Palpations: Abdomen is soft.     Tenderness: There is no abdominal tenderness. There is no guarding or rebound.  Musculoskeletal:        General: No tenderness.     Comments: Nonpitting edema to bilateral lower extremities.  2+ DP pulses bilaterally  Skin:    General: Skin is warm and dry.  Neurological:     Mental Status: She is alert and oriented to person, place, and time.     Comments: 5 out of 5 strength in all 4 extremities with sensation to light touch intact in all 4 extremities.  2+ left patellar reflex, unable to elicit right patellar reflex.  No ankle clonus bilaterally.  Psychiatric:        Behavior: Behavior normal.     ED Results / Procedures / Treatments   Labs (all labs ordered are listed, but only abnormal results are displayed) Labs Reviewed  URINALYSIS, ROUTINE W REFLEX MICROSCOPIC - Abnormal; Notable for the following components:      Result Value   Leukocytes,Ua TRACE (*)    Bacteria, UA RARE (*)    All other components within normal limits  COMPREHENSIVE METABOLIC PANEL  CBC WITH DIFFERENTIAL/PLATELET    EKG None  Radiology No results found.  Procedures Procedures    Medications Ordered in ED Medications  morphine (PF) 4 MG/ML injection 4 mg (has no administration in time range)    ED Course/ Medical Decision Making/ A&P                           Medical Decision Making Amount and/or Complexity of Data Reviewed Labs: ordered. Radiology: ordered.  Risk Prescription drug management.  Patient with recent diagnosis of metastatic pancreatic cancer here for evaluation of bilateral thigh and leg pain.  She does have urinary frequency.  Postvoid residual is 0.  She does not have any appreciable weakness on examination.  Concern for possible spinal cord compression given her symptoms and history.  Patient care transferred pending additional  imaging.        Final Clinical Impression(s) / ED Diagnoses Final diagnoses:  None    Rx / DC Orders ED Discharge Orders     None         Quintella Reichert, MD 03/05/22 [phone]

## 2022-03-05 NOTE — Plan of Care (Signed)
  Problem: Education: Goal: Knowledge of the prescribed therapeutic regimen will improve Outcome: Progressing   Problem: Activity: Goal: Ability to implement measures to reduce episodes of fatigue will improve Outcome: Progressing   Problem: Bowel/Gastric: Goal: Will not experience complications related to bowel motility Outcome: Progressing   Problem: Coping: Goal: Ability to identify and develop effective coping behavior will improve Outcome: Progressing   Problem: Nutritional: Goal: Maintenance of adequate nutrition will improve Outcome: Progressing   

## 2022-03-05 NOTE — Progress Notes (Signed)
  Radiation Oncology         [phone] ________________________________  Name: Makayla Good MRN: [id number]  Date: 03/05/2022  DOB: 11-11-1970  INPATIENT   SIMULATION AND TREATMENT PLANNING NOTE    ICD-10-CM   1. Metastatic cancer to spine [facility])  C79.51       DIAGNOSIS:  51 y.o. 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  Identity was confirmed.  All relevant records and images related to the planned course of therapy were reviewed.  The patient freely provided informed written consent to proceed with treatment after reviewing the details related to the planned course of therapy. The consent form was witnessed and verified by the simulation staff.  Then, the patient was set-up in a stable reproducible  supine position for radiation therapy.  CT images were obtained.  Surface markings were placed.  The CT images were loaded into the planning software.  Then the target and avoidance structures were contoured including kidneys.  Treatment planning then occurred.  The radiation prescription was entered and confirmed.  Then, I designed and supervised the construction of multiple medically necessary complex treatment devices with as body positioner and MLCs to shield critical organs.  I have requested : 3D Simulation  I have requested a DVH of the following structures: Left Kidney, Right Kidney lungs, heart, spinal cord and target.  PLAN:  The patient will receive 30 Gy in 10 fractions.  ________________________________  Sheral Apley Tammi Klippel, M.D.

## 2022-03-05 NOTE — ED Triage Notes (Signed)
Complaining of aches in the upper legs that started after being prescribed oxycodone on Friday. Did not have the aching before then. Denies any injury.

## 2022-03-05 NOTE — H&P (Signed)
History and Physical    Patient: Makayla Good QJF:[id number] DOB: 08/30/1970 DOA: 03/05/2022 DOS: the patient was seen and examined on 03/05/2022 PCP: [name], DO  Patient coming from: Home  Chief Complaint:  Chief Complaint  Patient presents with   Leg Pain   HPI: Makayla Good is a 51 y.o. female with medical history significant of HTN, HLD, S4 pancreatic adenocarcinoma. Presenting with thigh pain. Her symptoms started 3 days ago. She noticed that she was having an achy pain primarily in her right thigh. She didn't have any numbness or tingling there. She did not notice any dragging of her feet. She didn't have any incontinence of bowel or bladder; although, she has noticed increased frequency. She was concerned that she was having some effects from her new pain meds. She states that her pain medicine wasn't completely relieving the pain anyway. When her symptoms persisted this morning, she decided to come to the ED for evaluation. She denies any other aggravating or alleviating factors.   Review of Systems: As mentioned in the history of present illness. All other systems reviewed and are negative. Past Medical History:  Diagnosis Date   Asthma    Eczema    Fibroid    H/O oophorectomy    Hypertension    Obesity    Pancreatic adenocarcinoma ([place]) 03/01/2022   Polycystic ovary    Left   Sleep apnea    no CPAP per MD   Past Surgical History:  Procedure Laterality Date   BACK SURGERY     64yr ago   BREAST BIOPSY Right 2019   fibroadenoma   CESAREAN SECTION  02/01/2005   CESAREAN SECTION  2001   LAPAROSCOPIC GASTRIC SLEEVE RESECTION N/A 07/29/2017   Procedure: LAPAROSCOPIC GASTRIC SLEEVE RESECTION WITH UPPER ENDO ;  Surgeon: CClovis Riley MD;  Location: [facility];  Service: General;  Laterality: N/A;   OOPHORECTOMY     Rt.removed   TUBAL LIGATION  02/01/2005   Social History:  reports that she quit smoking about 22 months ago. Her  smoking use included cigarettes and e-cigarettes. She started smoking about 22 months ago. She has a 15.00 pack-year smoking history. She has never used smokeless tobacco. She reports current alcohol use. She reports that she does not use drugs.  No Known Allergies  Family History  Problem Relation Age of Onset   Hypertension Mother    Schizophrenia Mother    Dementia Mother    Mental illness Mother        schizophrenia, dementia   Hypertension Father    Coronary artery disease Father        Stent   Alzheimer's disease Father    Hypertension Sister    Hypertension Sister    Arthritis Sister        rheumatoid   Dementia Maternal Aunt    Kidney disease Other    Colon cancer Neg Hx    Stomach cancer Neg Hx    Pancreatic cancer Neg Hx    Esophageal cancer Neg Hx    Rectal cancer Neg Hx    Breast cancer Neg Hx     Prior to Admission medications   Medication Sig Start Date End Date Taking? Authorizing Provider  amLODipine (NORVASC) 5 MG tablet TAKE 1 TABLET (5 MG TOTAL) BY MOUTH DAILY. 11/07/21   LAnn Held DO  cyclobenzaprine (FLEXERIL) 10 MG tablet Take 1 tablet (10 mg total) by mouth at bedtime as needed for  muscle spasms. 01/25/22   Lorine Bears, NP  fluticasone (FLONASE) 50 MCG/ACT nasal spray SPRAY 2 SPRAYS INTO EACH NOSTRIL EVERY DAY Patient taking differently: Place 2 sprays into both nostrils daily as needed for allergies. 05/11/21   [name], DO  hydrochlorothiazide (HYDRODIURIL) 25 MG tablet Take 1 tablet (25 mg total) by mouth daily. 12/29/21   [name], DO  Melatonin 10 MG TABS Take 10 mg by mouth at bedtime as needed (sleep).    [provider]  Multiple Vitamins-Minerals (CELEBRATE MULTI-COMPLETE 36 PO) Take 1 each by mouth daily.    [provider]  nicotine (NICODERM CQ - DOSED IN MG/24 HOURS) 14 mg/24hr patch Place 1 patch (14 mg total) onto the skin daily. Patient not taking: Reported on 02/19/2022 12/08/21    Carollee Herter, Alferd Apa, DO  nicotine (NICODERM CQ) 7 mg/24hr patch Place 1 patch (7 mg total) onto the skin daily. Patient not taking: Reported on 02/19/2022 12/08/21   [name], DO  nicotine polacrilex (NICORETTE) 4 MG gum Take 4 mg by mouth as needed for smoking cessation.    [provider]  omeprazole (PRILOSEC) 40 MG capsule Take 1 capsule (40 mg total) by mouth daily. Patient taking differently: Take 40 mg by mouth daily as needed (acid reflux). 01/22/22   Martin[place], Betty G, MD  oxyCODONE (OXY IR/ROXICODONE) 5 MG immediate release tablet Take 1 tablet (5 mg total) by mouth every 6 (six) hours as needed for severe pain. 03/01/22   Volanda Napoleon, MD  potassium chloride (KLOR-CON) 10 MEQ tablet TAKE 1 TABLET BY MOUTH EVERY DAY 10/11/21   Carollee Herter, Yvonne R, DO  temazepam (RESTORIL) 15 MG capsule Take 1 capsule (15 mg total) by mouth at bedtime as needed for sleep. 02/28/22   Volanda Napoleon, MD    Physical Exam: Vitals:   03/05/22 0418 03/05/22 0420 03/05/22 0804  BP: (!) 153/103  (!) 147/86  Pulse: 97  81  Resp: 18  16  Temp: 98.2 F (36.8 C)  98.3 F (36.8 C)  TempSrc: Oral  Oral  SpO2: 100%  100%  Weight:  89.8 kg   Height:  '5\' 5"'$  (1.651 m)    General: 51 y.o. female resting in bed in NAD Eyes: PERRL, normal sclera ENMT: Nares patent w/o discharge, orophaynx clear, dentition normal, ears w/o discharge/lesions/ulcers Neck: Supple, trachea midline Cardiovascular: RRR, +S1, S2, no m/g/r, equal pulses throughout Respiratory: CTABL, no w/r/r, normal WOB GI: BS+, NDNT, no masses noted, no organomegaly noted MSK: No e/c/c Neuro: A&O x 3, parasthesia b/l fingers; MSK str is equal BUE/BLE 5/5 Psyc: Appropriate interaction and affect, calm/cooperative  Data Reviewed:  Results for orders placed or performed during the hospital encounter of 03/05/22 (from the past 24 hour(s))  Urinalysis, Routine w reflex microscopic Urine, Clean Catch     Status: Abnormal    Collection Time: 03/05/22  5:24 AM  Result Value Ref Range   Color, Urine YELLOW YELLOW   APPearance CLEAR CLEAR   Specific Gravity, Urine 1.016 1.005 - 1.030   pH 6.0 5.0 - 8.0   Glucose, UA NEGATIVE NEGATIVE mg/dL   Hgb urine dipstick NEGATIVE NEGATIVE   Bilirubin Urine NEGATIVE NEGATIVE   Ketones, ur NEGATIVE NEGATIVE mg/dL   Protein, ur NEGATIVE NEGATIVE mg/dL   Nitrite NEGATIVE NEGATIVE   Leukocytes,Ua TRACE (A) NEGATIVE   RBC / HPF 0-5 0 - 5 RBC/hpf   WBC, UA 0-5 0 - 5  WBC/hpf   Bacteria, UA RARE (A) NONE SEEN   Squamous Epithelial / LPF 6-10 0 - 5   Mucus PRESENT   Comprehensive metabolic panel     Status: Abnormal   Collection Time: 03/05/22  5:50 AM  Result Value Ref Range   Sodium 136 135 - 145 mmol/L   Potassium 3.5 3.5 - 5.1 mmol/L   Chloride 99 98 - 111 mmol/L   CO2 26 22 - 32 mmol/L   Glucose, Bld 124 (H) 70 - 99 mg/dL   BUN 12 6 - 20 mg/dL   Creatinine, Ser 0.54 0.44 - 1.00 mg/dL   Calcium 10.0 8.9 - 10.3 mg/dL   Total Protein 8.7 (H) 6.5 - 8.1 g/dL   Albumin 4.0 3.5 - 5.0 g/dL   AST 30 15 - 41 U/L   ALT 29 0 - 44 U/L   Alkaline Phosphatase 125 38 - 126 U/L   Total Bilirubin 0.9 0.3 - 1.2 mg/dL   GFR, Estimated >60 >60 mL/min   Anion gap 11 5 - 15  CBC with Differential     Status: None   Collection Time: 03/05/22  5:50 AM  Result Value Ref Range   WBC 7.2 4.0 - 10.5 K/uL   RBC 3.95 3.87 - 5.11 MIL/uL   Hemoglobin 12.2 12.0 - 15.0 g/dL   HCT 36.7 36.0 - 46.0 %   MCV 92.9 80.0 - 100.0 fL   MCH 30.9 26.0 - 34.0 pg   MCHC 33.2 30.0 - 36.0 g/dL   RDW 12.4 11.5 - 15.5 %   Platelets 322 150 - 400 K/uL   nRBC 0.0 0.0 - 0.2 %   Neutrophils Relative % 56 %   Neutro Abs 4.1 1.7 - 7.7 K/uL   Lymphocytes Relative 31 %   Lymphs Abs 2.3 0.7 - 4.0 K/uL   Monocytes Relative 10 %   Monocytes Absolute 0.7 0.1 - 1.0 K/uL   Eosinophils Relative 2 %   Eosinophils Absolute 0.1 0.0 - 0.5 K/uL   Basophils Relative 1 %   Basophils Absolute 0.1 0.0 - 0.1 K/uL    Immature Granulocytes 0 %   Abs Immature Granulocytes 0.01 0.00 - 0.07 K/uL   MRI C-spine 1. Positive for Cervical Spine Metastatic disease. Dominant C4 and C5 vertebra body metastases, with associated abnormal dural thickening and enhancement C3 through C6, and suspicion of left C4 extraosseous tumor extension affecting the C5 neural foramen and non dominant left vertebral artery there. 2. Combined degenerative and malignant spinal stenosis with Mild Spinal Cord Compression from C3-C4 through C5-C6. No convincing cord edema, myelomalacia. No intramedullary spinal cord metastasis. 3. Trace prevertebral space fluid from C2 to C5 appears reactive. No strong evidence of spinal infection, although could appear similar to the above. 4. Thoracic spinal metastases detailed separately today.  MRI T-spine 1. Multifocal metastatic disease to the Thoracic Spine with pathologic fracture of T4 (moderate) and extraosseous and/or epidural tumor contributing to: - spinal stenosis at T4, T9, and T10. - Spinal Cord Compression at T9 (mild) with no cord edema. - Severe foraminal neural impingement at the left T2, right T8, T9 and T10 nerve levels. 2. Pronounced posterior dural/epidural tumor at T9 and T10 in association with #1. Extraosseous tumor tracking along the posterior ribs on the left at T2, on the right at T9. 3. Liver metastases.  Trace layering pleural effusions.  MRI L-spine 1. Heterogeneous lumbosacral metastatic disease, likely involvement of the right iliac bone also. 2. L3 pathologic compression fracture  with mild loss of height but bulky ventral epidural tumor resulting in moderate to severe malignant spinal stenosis there. 3. Early tumor infiltration of the right S1 nerve course, in conjunction with L5-S1 posterior element tumor extraosseous extension. 4. Superimposed degenerative moderate to severe spinal stenosis at L4-L5 and foraminal stenosis at the bilateral L5 nerve  levels. 5. No cauda equina metastases or lumbar dural metastatic disease identified.  Assessment and Plan: RLE pain secondary to metastatic disease S4 pancreatic adenocarcinoma w/ mets to liver and spine     - place in obs, med-surg     - spinal stenosis and spinal cord compression of various degrees noted at C3-C4, C5-C6, T4, T9, T10, L4-L5     - pathologic fractures at T4, L3     - EDP discussed case w/ neurosurgery; rec'd rad-onc consult; rad-onc to see     - EDP discussed case w/ heme-onc; rec'd decadron '40mg'$  IV load followed by '20mg'$  IV daily; they will see     - PRN pain control, anti-emetics    HTN     - continue home regimen when confirmed  Insomnia     - continue home regimen when confirmed  HLD     - continue home regimen when confirmed  Advance Care Planning:   Code Status: FULL  Consults: Rad-onc, Heme-onc  Family Communication: w/ family at bedside  Severity of Illness: The appropriate patient status for this patient is OBSERVATION. Observation status is judged to be reasonable and necessary in order to provide the required intensity of service to ensure the patient's safety. The patient's presenting symptoms, physical exam findings, and initial radiographic and laboratory data in the context of their medical condition is felt to place them at decreased risk for further clinical deterioration. Furthermore, it is anticipated that the patient will be medically stable for discharge from the hospital within 2 midnights of admission.   Author: Jonnie Finner, DO 03/05/2022 9:56 AM  For on call review [email or web address].

## 2022-03-05 NOTE — ED Notes (Signed)
Pt gone to MRI, vital will be taken on return.

## 2022-03-05 NOTE — ED Notes (Addendum)
MRI to took pt . Will be gone until about 0800

## 2022-03-05 NOTE — Progress Notes (Signed)
Came to pt's room to do nursing admission history. Pt is out of room. Unable to complete nursing admission history at this time. Doroteo Bradford BSN, RN-BC Throughput Nurse 03/05/2022 3:14 PM

## 2022-03-05 NOTE — ED Provider Notes (Signed)
  Physical Exam  BP (!) 147/86   Pulse 81   Temp 98.3 F (36.8 C) (Oral)   Resp 16   Ht '5\' 5"'$  (1.651 m)   Wt 89.8 kg   LMP 07/27/2020 (Within Days)   SpO2 100%   BMI 32.95 kg/m   Physical Exam Constitutional:      Appearance: Normal appearance.  Pulmonary:     Effort: Pulmonary effort is normal.  Neurological:     Mental Status: She is alert.     Comments: Paresthesias subjective b/l UE, equal 5 out of 5 strength bilateral knee flexors and extensors and hip flexors and grip strength     Procedures  Procedures  ED Course / MDM   Clinical Course as of 03/05/22 0956  Mon Mar 05, 2022  0709 51 F with new metastatic pancreatic cancer not on XRT now with bilateral thigh pain and paresthesias of hands. Normal PVR. Normal strength b/l LE. Follow up MRI/pain control/ambulation. [VB]  [phone] Spoke with on call radiologist Dr Nevada Crane regarding diffuse spinal metastatic disease. Cord compression present in cervical and thoracic spine both mild. No cord edema. Lumbar spine bulky epidural tumor causing malignant stenosis contributing to cauda equina. No edema present of cord. Ordering decadron 10. Consulting neurosurgery. [VB]  [phone] Spoke with Sherley Bounds of neurosurgery who suggested I reach out to oncology as she will likely need palliative radiation for her findings.  They will review the imaging once after the OR but suspect likely no surgical intervention at this time. [VB]  [phone] Spoke with Dr. [name] of oncology who recommends starting with 40 IV Decadron followed by 20 IV decadron daily. Ive also ordered pepcid . Also recommends reaching out to radiation oncology for radiation. Paging hospitalist for admission. [VB]  [phone] Spoke with Dr Marylyn Ishihara who will admit under his care. Waiting on radiation oncology. [VB]  [phone] Radiation oncology to see patient inpatient.  I have also discussed the patient the plans.  She is admitted under Dr. Marylyn Ishihara. [VB]    Clinical Course User Index [VB] Elgie Congo, MD   Medical Decision Making Amount and/or Complexity of Data Reviewed Labs: ordered. Radiology: ordered.  Risk Prescription drug management. Decision regarding hospitalization.          Elgie Congo, MD 03/05/22 [phone]

## 2022-03-05 NOTE — Progress Notes (Signed)
[place]         [phone] ________________________________  Initial inpatient Consultation  Name: Makayla Good MRN: [id number]  Date: 03/05/2022  DOB: [birth year]  REFERRING PHYSICIAN: Dr. [name]  DIAGNOSIS: 51 yo woman with multi-level spinal cord compression from newly diagnosed stage IV pancreatic cancer.    ICD-10-CM   1. Metastatic malignant neoplasm, unspecified site [facility])  C79.9       HISTORY OF PRESENT ILLNESS::Makayla Good is a 51 y.o. female who presented to her PCP complaining of some abdominal discomfort.  Dr. Carollee Herter obtained a right upper quadrant ultrasound showing liver metastases.  CT abd/pelv was done on 02/08/2022.  The CT showed that there were numerous lesions in the liver.  She also had a mass in the pancreatic tail measuring 3.3 x 2.4 cm.  She had some mildly prominent upper abdominal lymph nodes.   A MRI of the abdomen was done on 02/09/2022.  This confirmed multiple lesions in the liver.  The largest measured 1.8 x 1.5 cm.  In the pancreas, there was a mass that measured 2.9 x 2.1 cm.  She did have some enlarged portacaval lymph nodes and left para-aortic lymph nodes.   Ultrasound guided liver Biopsy on 02/20/22 revealed Metastatic adenocarcinoma most likely represents metastatic adenocarcinoma from a primary in the pancreas.   The patient met with Dr. [name] and was planned to start FOLFIRINOX -start cycle 1 on 03/19/2022  During this work-up she has had mid Back Pain getting worse, and presented to the ED this morning.  MRI's of the C, T and L-spine were performed showing multilevel spinal metastases with some evidence of cord compression  C-spine showed C4 and C5 vertebra body metastases, with associated abnormal dural thickening and enhancement C3 through C6, and suspicion of left C4 extraosseous tumor extension affecting the C5 neural foramen   T-spine showed Multifocal metastatic disease to  the Thoracic Spine with pathologic fracture of T4 (moderate) and extraosseous and/or epidural tumor contributing to: - spinal stenosis at T4, T9, and T10. - Spinal Cord Compression at T9 (mild) with no cord edema. - Severe foraminal neural impingement at the left T2, right T8, T9 and T10 nerve levels.   L-spine showed L3 pathologic compression fracture with mild loss of height but bulky ventral epidural tumor resulting in moderate to severe malignant spinal stenosis there.     The patient's MRI was reviewed by Dr. Ronnald Ramp of neurosurgery and she has kindly been referred for consideration of radiotherapy.Marland Kitchen  PREVIOUS RADIATION THERAPY: No  Past Medical History:  Diagnosis Date   Asthma    Eczema    Fibroid    H/O oophorectomy    Hypertension    Obesity    Pancreatic adenocarcinoma ([place]) 03/01/2022   Polycystic ovary    Left   Sleep apnea    no CPAP per MD  :   Past Surgical History:  Procedure Laterality Date   BACK SURGERY     51yr ago   BREAST BIOPSY Right 2019   fibroadenoma   CESAREAN SECTION  02/01/2005   CESAREAN SECTION  2001   LAPAROSCOPIC GASTRIC SLEEVE RESECTION N/A 07/29/2017   Procedure: LAPAROSCOPIC GASTRIC SLEEVE RESECTION WITH UPPER ENDO ;  Surgeon: CClovis Riley MD;  Location: [facility] ORS;  Service: General;  Laterality: N/A;   OOPHORECTOMY     Rt.removed   TUBAL LIGATION  02/01/2005  :   Current Facility-Administered Medications:    dexamethasone (DECADRON) 10 MG/ML injection, , , ,  famotidine (PEPCID) IVPB 20 mg premix, 20 mg, Intravenous, Q12H, Elgie Congo, MD, Stopped at 03/05/22 1115   morphine (PF) 4 MG/ML injection 4 mg, 4 mg, Intravenous, Q2H PRN, Quintella Reichert, MD, 4 mg at 03/05/22 1103  Current Outpatient Medications:    amLODipine (NORVASC) 5 MG tablet, TAKE 1 TABLET (5 MG TOTAL) BY MOUTH DAILY., Disp: 90 tablet, Rfl: 1   cyclobenzaprine (FLEXERIL) 10 MG tablet, Take 1 tablet (10 mg total) by mouth at bedtime as needed for muscle  spasms., Disp: 30 tablet, Rfl: 0   fluticasone (FLONASE) 50 MCG/ACT nasal spray, SPRAY 2 SPRAYS INTO EACH NOSTRIL EVERY DAY (Patient taking differently: Place 2 sprays into both nostrils daily as needed for allergies.), Disp: 48 mL, Rfl: 2   hydrochlorothiazide (HYDRODIURIL) 25 MG tablet, Take 1 tablet (25 mg total) by mouth daily., Disp: 90 tablet, Rfl: 1   Melatonin 10 MG TABS, Take 10 mg by mouth at bedtime as needed (sleep)., Disp: , Rfl:    Multiple Vitamins-Minerals (CELEBRATE MULTI-COMPLETE 36 PO), Take 1 each by mouth daily., Disp: , Rfl:    nicotine (NICODERM CQ - DOSED IN MG/24 HOURS) 14 mg/24hr patch, Place 1 patch (14 mg total) onto the skin daily. (Patient not taking: Reported on 02/19/2022), Disp: 28 patch, Rfl: 1   nicotine (NICODERM CQ) 7 mg/24hr patch, Place 1 patch (7 mg total) onto the skin daily. (Patient not taking: Reported on 02/19/2022), Disp: 28 patch, Rfl: 0   nicotine polacrilex (NICORETTE) 4 MG gum, Take 4 mg by mouth as needed for smoking cessation., Disp: , Rfl:    omeprazole (PRILOSEC) 40 MG capsule, Take 1 capsule (40 mg total) by mouth daily. (Patient taking differently: Take 40 mg by mouth daily as needed (acid reflux).), Disp: 30 capsule, Rfl: 0   oxyCODONE (OXY IR/ROXICODONE) 5 MG immediate release tablet, Take 1 tablet (5 mg total) by mouth every 6 (six) hours as needed for severe pain., Disp: 90 tablet, Rfl: 0   potassium chloride (KLOR-CON) 10 MEQ tablet, TAKE 1 TABLET BY MOUTH EVERY DAY, Disp: 90 tablet, Rfl: 1   temazepam (RESTORIL) 15 MG capsule, Take 1 capsule (15 mg total) by mouth at bedtime as needed for sleep., Disp: 30 capsule, Rfl: 0:  No Known Allergies:   Family History  Problem Relation Age of Onset   Hypertension Mother    Schizophrenia Mother    Dementia Mother    Mental illness Mother        schizophrenia, dementia   Hypertension Father    Coronary artery disease Father        Stent   Alzheimer's disease Father    Hypertension Sister     Hypertension Sister    Arthritis Sister        rheumatoid   Dementia Maternal Aunt    Kidney disease Other    Colon cancer Neg Hx    Stomach cancer Neg Hx    Pancreatic cancer Neg Hx    Esophageal cancer Neg Hx    Rectal cancer Neg Hx    Breast cancer Neg Hx   :   Social History   Socioeconomic History   Marital status: Married    Spouse name: Not on file   Number of children: Not on file   Years of education: 13   Highest education level: Not on file  Occupational History   Occupation: release liens    Employer: [employer]  Tobacco Use   Smoking status: Former  Packs/day: 0.50    Years: 30.00    Total pack years: 15.00    Types: Cigarettes, E-cigarettes    Start date: 04/28/2020    Quit date: 2022    Years since quitting: 1.8   Smokeless tobacco: Never   Tobacco comments:    VAPES  Vaping Use   Vaping Use: Every day   Substances: Nicotine  Substance and Sexual Activity   Alcohol use: Yes    Comment: occasional   Drug use: No   Sexual activity: Not Currently    Partners: Male    Birth control/protection: Surgical    Comment: Tubal/been with same person over 20 years  Other Topics Concern   Not on file  Social History Narrative   Exercise--gym, every other day--- at least 3 days a week   Social Determinants of Health   Financial Resource Strain: Low Risk  (02/26/2022)   Overall Financial Resource Strain (CARDIA)    Difficulty of Paying Living Expenses: Not hard at all  Food Insecurity: No Food Insecurity (02/26/2022)   Hunger Vital Sign    Worried About Running Out of Food in the Last Year: Never true    Ran Out of Food in the Last Year: Never true  Transportation Needs: No Transportation Needs (02/26/2022)   PRAPARE - Hydrologist (Medical): No    Lack of Transportation (Non-Medical): No  Physical Activity: Not on file  Stress: Not on file  Social Connections: Not on file  Intimate Partner Violence: Not on file   :  REVIEW OF SYSTEMS:  A 15 point review of systems is documented in the electronic medical record. This was obtained by the nursing staff. However, I reviewed this with the patient to discuss relevant findings and make appropriate changes.  {Ros - complete:30496}   PHYSICAL EXAM:  Blood pressure (!) 147/86, pulse 81, temperature 98.2 F (36.8 C), temperature source Oral, resp. rate 16, height _0  (1.651 m), weight 198 lb (89.8 kg), last menstrual period 07/27/2020, SpO2 100 %. {physical exam:3041130}  KPS = ***  100 - Normal; no complaints; no evidence of disease. 90   - Able to carry on normal activity; minor signs or symptoms of disease. 80   - Normal activity with effort; some signs or symptoms of disease. 30   - Cares for self; unable to carry on normal activity or to do active work. 60   - Requires occasional assistance, but is able to care for most of his personal needs. 50   - Requires considerable assistance and frequent medical care. 38   - Disabled; requires special care and assistance. 30   - Severely disabled; hospital admission is indicated although death not imminent. 75   - Very sick; hospital admission necessary; active supportive treatment necessary. 10   - Moribund; fatal processes progressing rapidly. 0     - Dead  Karnofsky DA, Abelmann [place], Craver LS and Burchenal [facility] [phone]) The use of the nitrogen mustards in the palliative treatment of carcinoma: with particular reference to bronchogenic carcinoma Cancer 1 [id number]  LABORATORY DATA:  Lab Results  Component Value Date   WBC 7.2 03/05/2022   HGB 12.2 03/05/2022   HCT 36.7 03/05/2022   MCV 92.9 03/05/2022   PLT 322 03/05/2022   Lab Results  Component Value Date   NA 136 03/05/2022   K 3.5 03/05/2022   CL 99 03/05/2022   CO2 26 03/05/2022   Lab Results  Component Value Date  ALT 29 03/05/2022   AST 30 03/05/2022   ALKPHOS 125 03/05/2022   BILITOT 0.9 03/05/2022     RADIOGRAPHY: MR Lumbar Spine W  Wo Contrast  Addendum Date: 03/05/2022   ADDENDUM REPORT: 03/05/2022 08:52 ADDENDUM: Study discussed by telephone with Dr. Georgina Snell in the ER on 03/05/2022 at 0838 hours. Electronically Signed   By: Genevie Ann M.D.   On: 03/05/2022 08:52   Result Date: 03/05/2022 CLINICAL DATA:  51 year old female with back and leg pain. Recently diagnosed with metastatic pancreatic adenocarcinoma. Myelopathy. EXAM: MRI LUMBAR SPINE WITHOUT AND WITH CONTRAST TECHNIQUE: Multiplanar and multiecho pulse sequences of the lumbar spine were obtained without and with intravenous contrast. CONTRAST:  43m GADAVIST GADOBUTROL 1 MMOL/ML IV SOLN COMPARISON:  Cervical and thoracic MRI today. Lumbar radiographs this morning. Abdomen MRI 02/09/2022. FINDINGS: Segmentation: Normal, concordant with the cervical and thoracic numbering today. Alignment: Minimal levoconvex lumbar scoliosis. Straightening of lumbar lordosis as demonstrated radiographically today. Vertebrae: Metastatic disease infiltrating the right L1 vertebral body, most of the L3 vertebral body with associated bulky tumor extension into the ventral epidural space and mild pathologic fracture of the L3 inferior endplate (see below), in the posterosuperior L4 body, heterogeneously infiltrating the L5 body, the S1 body, and with bulky right L5-S1 posterior element tumor with some extraosseous extension (series 1, image 1). Heterogeneous enhancing metastasis also in the right sacral ala and probably the medial right iliac bone both visible on series 13, image 35. Conus medullaris and cauda equina: Conus extends to the L1 level. No lower spinal cord or conus signal abnormality. Malignant spinal stenosis (see below), but no convincing abnormal intradural or cauda equina enhancement. And no definite dural thickening in the lumbar spine. Paraspinal and other soft tissues: Faintly visible liver metastases. Low level but suspicious retroperitoneal lymphadenopathy primarily on  the left. Disc levels: Notable for L3: Bulky ventral epidural tumor (series 12, image 20) resulting in moderate to severe spinal stenosis and proximal bilateral L3 foraminal stenosis. L4-L5: Multifactorial moderate to severe spinal stenosis here appears primarily to be degenerative in nature, including bulky ligament flavum hypertrophy and disc bulging. L5-S1: Moderate to severe bilateral L5 foraminal stenosis here appears primarily to be degenerative in nature, but there is early extraosseous tumor involvement of the right S1 neural foramina suspected. IMPRESSION: 1. Heterogeneous lumbosacral metastatic disease, likely involvement of the right iliac bone also. 2. 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 3. Early tumor infiltration of the right S1 nerve course, in conjunction with L5-S1 posterior element tumor extraosseous extension. 4. Superimposed degenerative moderate to severe spinal stenosis at L4-L5 and foraminal stenosis at the bilateral L5 nerve levels. 5. No cauda equina metastases or lumbar dural metastatic disease identified. Electronically Signed: By: HGenevie AnnM.D. On: 03/05/2022 08:30   MR THORACIC SPINE W WO CONTRAST  Addendum Date: 03/05/2022   ADDENDUM REPORT: 03/05/2022 08:47 ADDENDUM: Study discussed by telephone with Dr. VGeorgina Snellin the ER on 03/05/2022 at 0838 hours. Electronically Signed   By: HGenevie AnnM.D.   On: 03/05/2022 08:47   Result Date: 03/05/2022 CLINICAL DATA:  F34year old female with back and leg pain. Recently diagnosed with metastatic pancreatic adenocarcinoma. Myelopathy. EXAM: MRI THORACIC WITHOUT AND WITH CONTRAST TECHNIQUE: Multiplanar and multiecho pulse sequences of the thoracic spine were obtained without and with intravenous contrast. CONTRAST:  960mGADAVIST GADOBUTROL 1 MMOL/ML IV SOLN COMPARISON:  Cervical spine MRI today reported  separately. Abdomen MRI 02/09/2022. FINDINGS: Limited cervical spine imaging:  Reported separately today. Thoracic spine segmentation:  Appears to be normal. Alignment: Mildly exaggerated upper thoracic kyphosis in the setting of T4 pathologic fracture, see below. Elsewhere maintained thoracic kyphosis. Vertebrae: Scattered thoracic vertebral metastatic disease: T2, T4, T9 and T10. Associated left T2 vertebral transverse process and posterior element involvement with extraosseous tumor extension (series 20, image 6), partially involving the posterior rib Associated moderate T4 pathologic compression fracture with some retropulsion of bone and/or tumor, see details below. Bulky posterior element metastasis and extraosseous/epidural tumor eccentric to the right at T9 and T10 (series 26, image 28), with extraosseous tumor extending into and along the right posterior rib, see details below. Benign T12 vertebral body hemangioma. Cord: Effaced ventral spinal cord at T4 secondary to epidural/retropulsed tumor (series 20, image 12). No cord compression or cord signal abnormality there. Mild malignant cord compression at T9 related to right posterior and lateral spinal epidural tumor up to 5 mm in thickness (series 26, image 28). Cord mass effect when combined with T8-T9 disc bulging there. But no cord edema or myelomalacia. Mild combined degenerative and malignant spinal stenosis at T9-T10. No significant cord mass effect. Lower T10 vertebral level ventral epidural tumor (series 26, image 31) effaces the ventral CSF space with no cord compression. No lower thoracic spinal stenosis and conus medullaris appears to be normal at T12-L1. Abnormal dural thickening and enhancement but no intradural or leptomeningeal enhancement. Paraspinal and other soft tissues: Extraosseous extension of paraspinal tumor on the left at T2, on the right at T9 as above. Trace bilateral layering pleural effusions. Grossly negative visible  mediastinum. Multiple liver lesions as described on abdomen MRI last month. Disc levels: Combined thoracic spine degeneration and malignancy resulting in multilevel spinal stenosis, reiterating as above this is most pronounced at T2-T3: Malignant severe left T2 foraminal stenosis. T4: Pathologic compression fracture with retropulsion and/or epidural tumor mild spinal stenosis without compression). T8-T9: Left eccentric disc degeneration and broad-based posterior disc combined on 5 mm thick right posterior and lateral epidural tumor with mild cord compression. No cord edema. Severe combined degenerative and malignant right T8 neural foraminal stenosis. T9-T10: Broad-based and lobulated posterior disc bulging superimposed on dural thickening. Mild spinal stenosis without cord compression. But severe combined degenerative and malignant right T9 foraminal stenosis. T10-T11: Ventral epidural tumor (series 26, image 31) with mild spinal stenosis, no cord compression. Malignant appearing moderate to severe right T10 foraminal stenosis. IMPRESSION: 1. Multifocal metastatic disease to the Thoracic Spine with pathologic fracture of T4 (moderate) and extraosseous and/or epidural tumor contributing to: - spinal stenosis at T4, T9, and T10. - Spinal Cord Compression at T9 (mild) with no cord edema. - Severe foraminal neural impingement at the left T2, right T8, T9 and T10 nerve levels. 2. Pronounced posterior dural/epidural tumor at T9 and T10 in association with #1. Extraosseous tumor tracking along the posterior ribs on the left at T2, on the right at T9. 3. Liver metastases.  Trace layering pleural effusions. Electronically Signed: By: Genevie Ann M.D. On: 03/05/2022 08:22   MR Cervical Spine W or Wo Contrast  Addendum Date: 03/05/2022   ADDENDUM REPORT: 03/05/2022 08:44 ADDENDUM: Study discussed by telephone with Dr. Georgina Snell in the ER on 03/05/2022 at 0838 hours. Electronically Signed   By: Genevie Ann M.D.   On:  03/05/2022 08:44   Result Date: 03/05/2022 CLINICAL DATA:  51 year old female with back and  leg pain. Recently diagnosed with metastatic pancreatic adenocarcinoma. Myelopathy. EXAM: MRI CERVICAL SPINE WITHOUT AND WITH CONTRAST TECHNIQUE: Multiplanar and multiecho pulse sequences of the cervical spine, to include the craniocervical junction and cervicothoracic junction, were obtained without and with intravenous contrast. CONTRAST:  51m GADAVIST GADOBUTROL 1 MMOL/ML IV SOLN COMPARISON:  Brain MRI 09/15/2020.  Cervical spine CT 09/30/2020. FINDINGS: Alignment: Chronic straightening of cervical lordosis. Mild reversal is new from last year. No significant spondylolisthesis. Vertebrae: Visible skull base bone marrow signal is stable since last year and within normal limits. C1 through C3 appear to remain normal. But confluent abnormal marrow signal and heterogeneous enhancement throughout the C4 and C5 vertebral bodies consistent with metastatic disease. Some posterior element involvement at both levels. The C6 vertebra appears largely spared. But there is patchy metastatic involvement of the left C7 vertebral body suspected (series 7, image 10). No definite pathologic compression fracture at this time. Upper thoracic metastases are reported separately today. Cord: Abnormal cervical spinal cord mass effect from C3-C4 through C5-C6 is in part degenerative (calcified ligament flavum hypertrophy on the right at C3-C4 series 8, image 14) but appears partially malignant due to abnormal dural thickening and enhancement from C3-C7 (series 11, image 7). Circumferential abnormal dura or epidural space at both the C4 and C5 levels (series 12, image 25). Multilevel mild spinal cord compression with no convincing cord edema. No myelomalacia. Below C5 the spinal canal and cord appear normal. No abnormal intramedullary enhancement. Posterior Fossa, vertebral arteries, paraspinal tissues: Cervicomedullary junction is within  normal limits. Visible posterior fossa appears stable and negative. Preserved major vascular flow voids in the neck with dominant right vertebral artery. However, questionable extraosseous paraspinal muscle and transverse foraminal tumor on the left at C4 (series 12, image 20). There seems to be mass effect on the non dominant left vertebral artery there. And there is also trace abnormal prevertebral fluid from C2-C5 (series 7, image 8). But no generalized paraspinal soft tissue inflammation. Disc levels: Superimposed degenerative cervical spinal cord changes contributing to spinal stenosis and mild spinal cord mass effect at: C3-C4: Circumferential disc osteophyte complex and chronic calcified right ligament flavum hypertrophy (series 8, image 14). C4-C5: Circumferential disc bulging with a broad-based posterior component. C5-C6: Disc space loss with circumferential disc osteophyte complex. Broad-based posterior component. IMPRESSION: 1. Positive for Cervical Spine Metastatic disease. Dominant C4 and C5 vertebra body metastases, with associated abnormal dural thickening and enhancement C3 through C6, and suspicion of left C4 extraosseous tumor extension affecting the C5 neural foramen and non dominant left vertebral artery there. 2. Combined degenerative and malignant spinal stenosis with Mild Spinal Cord Compression from C3-C4 through C5-C6. No convincing cord edema, myelomalacia. No intramedullary spinal cord metastasis. 3. Trace prevertebral space fluid from C2 to C5 appears reactive. No strong evidence of spinal infection, although could appear similar to the above. 4. Thoracic spinal metastases detailed separately today. Electronically Signed: By: HGenevie AnnM.D. On: 03/05/2022 08:08   DG Lumbar Spine Complete  Result Date: 03/05/2022 CLINICAL DATA:  51year old female with back and leg pain. No known injury. Recently diagnosed metastatic Pancreatic adenocarcinoma. EXAM: LUMBAR SPINE - COMPLETE 4+ VIEW  COMPARISON:  Chest CT 02/15/2022. CT Abdomen and Pelvis 02/08/2022. FINDINGS: Normal lumbar segmentation. Bone mineralization is within normal limits. Stable lumbar lordosis from the CT last month. Subtle anterolisthesis of L4 on L5. L3-L4 through L5-S1 disc space loss and endplate spurring, severe at L5-S1. Vacuum disc there and at L4-L5 on the CT last month. No  pars fracture. Visible lower thoracic levels appear intact. Visible sacrum appears intact. No acute osseous abnormality identified. Grossly negative visible lung bases. Non obstructed bowel gas pattern. IMPRESSION: No acute osseous abnormality identified in the lumbar spine. Disc and endplate degeneration W0-J8 through L5-S1, severe at the latter. Electronically Signed   By: Genevie Ann M.D.   On: 03/05/2022 06:36   US BIOPSY (LIVER)  Result Date: 02/20/2022 INDICATION: Concern for metastatic pancreatic cancer. Please perform ultrasound-guided liver lesion biopsy for tissue diagnostic purposes. EXAM: ULTRASOUND GUIDED LIVER LESION BIOPSY COMPARISON:  CT abdomen and pelvis-02/08/2022; abdominal MRI-01/30/2022 MEDICATIONS: None ANESTHESIA/SEDATION: Moderate (conscious) sedation was employed during this procedure as administered by the Interventional Radiology RN. A total of Versed 1 mg and Fentanyl 25 mcg was administered intravenously. Moderate Sedation Time: 20 minutes. The patient's level of consciousness and vital signs were monitored continuously by radiology nursing throughout the procedure under my direct supervision. COMPLICATIONS: None immediate. PROCEDURE: Informed written consent was obtained from the patient after a discussion of the risks, benefits and alternatives to treatment. The patient understands and consents the procedure. A timeout was performed prior to the initiation of the procedure. Ultrasound scanning was performed of the right upper abdominal quadrant demonstrates several ill-defined isoechoic liver lesions scattered throughout the  liver though evaluation of the lesions was degraded secondary to a combination of the small size of the individual nodules as well as poor sonographic window due to patient body habitus. An approximately 1.3 x 1.1 cm lesion within the right lobe of the liver (image 9), likely correlating with the lesion seen on preceding abdominal CT image 13, series 2) was targeted for biopsy given location and sonographic window. The procedure was planned. The right upper abdominal quadrant was prepped and draped in the usual sterile fashion. The overlying soft tissues were anesthetized with 1% lidocaine with epinephrine. A 17 gauge, 6.8 cm co-axial needle was advanced into a peripheral aspect of the lesion. This was followed by 4 core biopsies with an 18 gauge core device under direct ultrasound guidance. The coaxial needle tract was embolized with a small amount of Gel-Foam slurry and superficial hemostasis was obtained with manual compression. Post procedural scanning was negative for definitive area of hemorrhage or additional complication. A dressing was applied. The patient tolerated the procedure well without immediate post procedural complication. IMPRESSION: Technically successful ultrasound guided core needle biopsy of indeterminate lesion within the right lobe of the liver. Electronically Signed   By: Sandi Mariscal M.D.   On: 02/20/2022 15:44   CT Chest W Contrast  Result Date: 02/16/2022 CLINICAL DATA:  Pancreatic cancer.  Staging.  * Tracking Code: BO * EXAM: CT CHEST WITH CONTRAST TECHNIQUE: Multidetector CT imaging of the chest was performed during intravenous contrast administration. RADIATION DOSE REDUCTION: This exam was performed according to the departmental dose-optimization program which includes automated exposure control, adjustment of the mA and/or kV according to patient size and/or use of iterative reconstruction technique. CONTRAST:  72m OMNIPAQUE IOHEXOL 300 MG/ML  SOLN COMPARISON:  None Available.  FINDINGS: Cardiovascular: The heart size is normal. No substantial pericardial effusion. No thoracic aortic aneurysm. No substantial atherosclerosis of the thoracic aorta. Mediastinum/Nodes: Lymph nodes of normal to upper normal size are seen in the anterior mediastinum. No mediastinal lymphadenopathy. 9 mm short axis right hilar node evident with no hilar lymphadenopathy by CT size criteria. The esophagus has normal imaging features. There is no axillary lymphadenopathy. Lungs/Pleura: 3 mm right middle lobe perifissural nodule seen on image 78/4.  3 mm right lower lobe perifissural nodule seen on image 70/4. No overtly suspicious pulmonary nodule or mass. No focal airspace consolidation. Subsegmental atelectasis noted in both lung bases. No pleural effusion. Upper Abdomen: Please see report for dedicated abdominal MRI performed 6 days ago. Musculoskeletal: No worrisome lytic or sclerotic osseous abnormality. IMPRESSION: 1. No definite findings to suggest metastatic disease in the chest. 2. Lymph nodes of normal to upper normal size are seen in the anterior mediastinum and right hilum. Attention on follow-up recommended. 3. 3 mm right middle and lower lobe perifissural nodules, likely benign. These could also be reassessed at the time of restaging. Electronically Signed   By: Misty Stanley M.D.   On: 02/16/2022 15:27   MR Abdomen W Wo Contrast  Result Date: 02/09/2022 CLINICAL DATA:  Evaluate for metastatic disease. EXAM: MRI ABDOMEN WITHOUT AND WITH CONTRAST TECHNIQUE: Multiplanar multisequence MR imaging of the abdomen was performed both before and after the administration of intravenous contrast. CONTRAST:  31m GADAVIST GADOBUTROL 1 MMOL/ML IV SOLN COMPARISON:  CT AP 02/08/2022 FINDINGS: Lower chest: No acute findings. Hepatobiliary: Multiple rim enhancing lesions are scattered throughout both lobes of liver compatible with metastatic disease. Index lesions include: -segment 8/4 lesion measures 1.8 x 1.5 cm,  image 36/16. -segment 6 lesion measures 1.6 x 1.3 cm, image 52/16. -segment 2 lesion measures 1.4 x 1.0 cm, image 33/16. Gallbladder appears within normal limits. Common bile duct measures up to 5 mm, image 14/3. No intrahepatic bile duct dilatation. Pancreas: There is no main duct dilatation. No pancreatic inflammation. Heterogeneous, centrally necrotic mass arising off the tail of pancreas measures 2.9 x 2.1 cm, image 43/16. Spleen:  Within normal limits in size and appearance. Adrenals/Urinary Tract: Normal adrenal glands. No nephrolithiasis, hydronephrosis or kidney mass. Stomach/Bowel: Visualized portions within the abdomen are unremarkable. Vascular/Lymphatic: Normal caliber abdominal aorta. The upper abdominal vascularity is patent. Enlarged portacaval lymph node measures 1.6 cm short axis, image 13/5 left periaortic lymph node measures 1.1 cm, image 23/5. Other:  No ascites. Musculoskeletal: No suspicious bone lesions identified. IMPRESSION: 1. Multiple rim enhancing lesions scattered throughout both lobes of liver compatible with metastatic disease. 2. Heterogeneous, centrally necrotic mass arising off the tail of pancreas measures 2.9 x 2.1 cm. This is concerning for a primary pancreatic neoplasm. 3. Enlarged portacaval and left periaortic lymph nodes compatible with metastatic disease. Electronically Signed   By: TKerby MoorsM.D.   On: 02/09/2022 13:24   MR 3D Recon At Scanner  Result Date: 02/09/2022 CLINICAL DATA:  Evaluate for metastatic disease. EXAM: MRI ABDOMEN WITHOUT AND WITH CONTRAST TECHNIQUE: Multiplanar multisequence MR imaging of the abdomen was performed both before and after the administration of intravenous contrast. CONTRAST:  945mGADAVIST GADOBUTROL 1 MMOL/ML IV SOLN COMPARISON:  CT AP 02/08/2022 FINDINGS: Lower chest: No acute findings. Hepatobiliary: Multiple rim enhancing lesions are scattered throughout both lobes of liver compatible with metastatic disease. Index lesions  include: -segment 8/4 lesion measures 1.8 x 1.5 cm, image 36/16. -segment 6 lesion measures 1.6 x 1.3 cm, image 52/16. -segment 2 lesion measures 1.4 x 1.0 cm, image 33/16. Gallbladder appears within normal limits. Common bile duct measures up to 5 mm, image 14/3. No intrahepatic bile duct dilatation. Pancreas: There is no main duct dilatation. No pancreatic inflammation. Heterogeneous, centrally necrotic mass arising off the tail of pancreas measures 2.9 x 2.1 cm, image 43/16. Spleen:  Within normal limits in size and appearance. Adrenals/Urinary Tract: Normal adrenal glands. No nephrolithiasis, hydronephrosis or kidney mass.  Stomach/Bowel: Visualized portions within the abdomen are unremarkable. Vascular/Lymphatic: Normal caliber abdominal aorta. The upper abdominal vascularity is patent. Enlarged portacaval lymph node measures 1.6 cm short axis, image 13/5 left periaortic lymph node measures 1.1 cm, image 23/5. Other:  No ascites. Musculoskeletal: No suspicious bone lesions identified. IMPRESSION: 1. Multiple rim enhancing lesions scattered throughout both lobes of liver compatible with metastatic disease. 2. Heterogeneous, centrally necrotic mass arising off the tail of pancreas measures 2.9 x 2.1 cm. This is concerning for a primary pancreatic neoplasm. 3. Enlarged portacaval and left periaortic lymph nodes compatible with metastatic disease. Electronically Signed   By: Kerby Moors M.D.   On: 02/09/2022 13:24   CT Abdomen Pelvis W Contrast  Result Date: 02/08/2022 CLINICAL DATA:  Right lower quadrant abdominal pain. Two weeks of right upper quadrant abdominal pain, especially after eating solids. No given history of malignancy. EXAM: CT ABDOMEN AND PELVIS WITH CONTRAST TECHNIQUE: Multidetector CT imaging of the abdomen and pelvis was performed using the standard protocol following bolus administration of intravenous contrast. RADIATION DOSE REDUCTION: This exam was performed according to the  departmental dose-optimization program which includes automated exposure control, adjustment of the mA and/or kV according to patient size and/or use of iterative reconstruction technique. CONTRAST:  171m OMNIPAQUE IOHEXOL 300 MG/ML  SOLN COMPARISON:  Abdominal ultrasound 01/25/2022 and 05/06/2020. No prior CT available. FINDINGS: Lower chest: Linear scarring at both lung bases. No suspicious pulmonary nodules are demonstrated. No significant pleural or pericardial effusion. Hepatobiliary: There are numerous ill-defined hypoattenuating lesions throughout all segments of the liver, worrisome for multifocal hepatic metastatic disease. Representative lesions include a 1.3 cm lesion anteriorly in the right lobe on image 13/2, a 1.0 cm lesion inferiorly in the right lobe on image 23/2 and a 1.2 cm lesion in the left lobe on image 15/2. Incomplete gallbladder distension with possible small polyp laterally. No evidence of gallstones or pericholecystic fluid. No biliary dilatation. Pancreas: There is an ill-defined mass in the pancreatic tail measuring 3.3 x 2.4 cm on image 18/2, worrisome for pancreatic malignancy based on the hepatic findings. The remainder of the pancreas appears normal, without ductal dilatation or definite surrounding inflammation. Spleen: Normal in size without focal abnormality. Adrenals/Urinary Tract: Both adrenal glands appear normal. No evidence of urinary tract calculus, suspicious renal lesion or hydronephrosis. There is a presumed phlebolith near the left ureterovesical junction. The bladder appears unremarkable for its degree of distention. Stomach/Bowel: Enteric contrast was administered and has passed into the distal colon. 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 No evidence of bowel distension, surrounding inflammation or wall thickening. The appendix appears normal. There are diverticular changes within the sigmoid colon without evidence of acute  inflammation. Vascular/Lymphatic: Prominent lymph nodes within the upper abdomen which must be viewed with some suspicion given the other findings above. There is a 9 mm node in the porta hepatis on image 20/2, a 1.5 cm portacaval node on image 22/2 and a left periaortic node measuring 1.0 cm on image 29/2. Mild aortic and branch vessel atherosclerosis without evidence of aneurysm. The portal, superior mesenteric and splenic veins are patent. Reproductive: The uterus and ovaries appear unremarkable. No suspicious adnexal masses. Other: No ascites or peritoneal nodularity.  Intact abdominal wall. Musculoskeletal: No acute osseous findings or evidence of osseous metastatic disease. Spondylosis in the lower lumbar spine. IMPRESSION: 1. Ill-defined mass in the pancreatic tail with numerous ill-defined hypoattenuating lesions throughout all segments of the liver, worrisome for pancreatic malignancy with multifocal hepatic metastatic  disease. Recommend further evaluation with abdominal MRI without and with contrast. 2. Mildly prominent upper abdominal lymph nodes are nonspecific, but could reflect nodal metastases. 3. No evidence of bowel obstruction or perforation. The appendix appears normal. 4.  Aortic Atherosclerosis (ICD10-I70.0). 5. These results will be called to the ordering clinician or representative by the Radiologist Assistant, and communication documented in the PACS or [employer]. Electronically Signed   By: Richardean Sale M.D.   On: 02/08/2022 12:07      IMPRESSION: ***  PLAN:***  I spent {CHL ONC TIME VISIT - EMVVK:[id number]} minutes face to face with the patient and more than 50% of that time was spent in counseling and/or coordination of care.   ------------------------------------------------   Tyler Pita, MD [facility] Director and Director of [facility] Direct Dial: [phone]  Fax: [phone] [email or web address]  Skype   LinkedIn

## 2022-03-06 ENCOUNTER — Encounter (HOSPITAL_COMMUNITY): Payer: Self-pay | Admitting: Hematology & Oncology

## 2022-03-06 ENCOUNTER — Ambulatory Visit
Admit: 2022-03-06 | Discharge: 2022-03-06 | Disposition: A | Discharge status: Home / Self Care | Payer: BC Managed Care – PPO | Attending: Radiation Oncology | Admitting: Radiation Oncology

## 2022-03-06 ENCOUNTER — Other Ambulatory Visit: Payer: Self-pay

## 2022-03-06 DIAGNOSIS — M4802 Spinal stenosis, cervical region: Secondary | ICD-10-CM | POA: Diagnosis present

## 2022-03-06 DIAGNOSIS — E282 Polycystic ovarian syndrome: Secondary | ICD-10-CM | POA: Diagnosis present

## 2022-03-06 DIAGNOSIS — Z87891 Personal history of nicotine dependence: Secondary | ICD-10-CM | POA: Diagnosis not present

## 2022-03-06 DIAGNOSIS — C787 Secondary malignant neoplasm of liver and intrahepatic bile duct: Secondary | ICD-10-CM | POA: Diagnosis present

## 2022-03-06 DIAGNOSIS — Z79899 Other long term (current) drug therapy: Secondary | ICD-10-CM | POA: Diagnosis not present

## 2022-03-06 DIAGNOSIS — M79604 Pain in right leg: Secondary | ICD-10-CM | POA: Diagnosis not present

## 2022-03-06 DIAGNOSIS — M8448XA Pathological fracture, other site, initial encounter for fracture: Secondary | ICD-10-CM | POA: Diagnosis present

## 2022-03-06 DIAGNOSIS — Z8507 Personal history of malignant neoplasm of pancreas: Secondary | ICD-10-CM | POA: Diagnosis not present

## 2022-03-06 DIAGNOSIS — M4804 Spinal stenosis, thoracic region: Secondary | ICD-10-CM | POA: Diagnosis present

## 2022-03-06 DIAGNOSIS — C7951 Secondary malignant neoplasm of bone: Secondary | ICD-10-CM | POA: Diagnosis not present

## 2022-03-06 DIAGNOSIS — E669 Obesity, unspecified: Secondary | ICD-10-CM | POA: Diagnosis present

## 2022-03-06 DIAGNOSIS — Z820 Family history of epilepsy and other diseases of the nervous system: Secondary | ICD-10-CM | POA: Diagnosis not present

## 2022-03-06 DIAGNOSIS — Z818 Family history of other mental and behavioral disorders: Secondary | ICD-10-CM | POA: Diagnosis not present

## 2022-03-06 DIAGNOSIS — Z8249 Family history of ischemic heart disease and other diseases of the circulatory system: Secondary | ICD-10-CM | POA: Diagnosis not present

## 2022-03-06 DIAGNOSIS — C259 Malignant neoplasm of pancreas, unspecified: Secondary | ICD-10-CM | POA: Diagnosis present

## 2022-03-06 DIAGNOSIS — I10 Essential (primary) hypertension: Secondary | ICD-10-CM | POA: Diagnosis present

## 2022-03-06 DIAGNOSIS — Z515 Encounter for palliative care: Secondary | ICD-10-CM | POA: Diagnosis not present

## 2022-03-06 DIAGNOSIS — G4700 Insomnia, unspecified: Secondary | ICD-10-CM | POA: Diagnosis present

## 2022-03-06 DIAGNOSIS — E785 Hyperlipidemia, unspecified: Secondary | ICD-10-CM | POA: Diagnosis present

## 2022-03-06 DIAGNOSIS — Z8261 Family history of arthritis: Secondary | ICD-10-CM | POA: Diagnosis not present

## 2022-03-06 DIAGNOSIS — Z6832 Body mass index (BMI) 32.0-32.9, adult: Secondary | ICD-10-CM | POA: Diagnosis not present

## 2022-03-06 DIAGNOSIS — J45909 Unspecified asthma, uncomplicated: Secondary | ICD-10-CM | POA: Diagnosis present

## 2022-03-06 DIAGNOSIS — K219 Gastro-esophageal reflux disease without esophagitis: Secondary | ICD-10-CM | POA: Diagnosis present

## 2022-03-06 DIAGNOSIS — G992 Myelopathy in diseases classified elsewhere: Secondary | ICD-10-CM | POA: Diagnosis present

## 2022-03-06 DIAGNOSIS — G893 Neoplasm related pain (acute) (chronic): Secondary | ICD-10-CM | POA: Diagnosis present

## 2022-03-06 LAB — RAD ONC ARIA SESSION SUMMARY
Course Elapsed Days: 0
Plan Fractions Treated to Date: 1
Plan Fractions Treated to Date: 1
Plan Prescribed Dose Per Fraction: 3 Gy
Plan Prescribed Dose Per Fraction: 3 Gy
Plan Total Fractions Prescribed: 10
Plan Total Fractions Prescribed: 10
Plan Total Prescribed Dose: 30 Gy
Plan Total Prescribed Dose: 30 Gy
Reference Point Dosage Given to Date: 3 Gy
Reference Point Dosage Given to Date: 3 Gy
Reference Point Session Dosage Given: 1.1054 Gy
Reference Point Session Dosage Given: 3 Gy
Session Number: 1

## 2022-03-06 LAB — COMPREHENSIVE METABOLIC PANEL
ALT: 25 U/L (ref 0–44)
AST: 24 U/L (ref 15–41)
Albumin: 3.8 g/dL (ref 3.5–5.0)
Alkaline Phosphatase: 117 U/L (ref 38–126)
Anion gap: 10 (ref 5–15)
BUN: 16 mg/dL (ref 6–20)
CO2: 24 mmol/L (ref 22–32)
Calcium: 10.2 mg/dL (ref 8.9–10.3)
Chloride: 103 mmol/L (ref 98–111)
Creatinine, Ser: 0.58 mg/dL (ref 0.44–1.00)
GFR, Estimated: >60 mL/min (ref >60)
Glucose, Bld: 145 mg/dL — ABNORMAL HIGH (ref 70–99)
Potassium: 3.7 mmol/L (ref 3.5–5.1)
Sodium: 137 mmol/L (ref 135–145)
Total Bilirubin: 0.6 mg/dL (ref 0.3–1.2)
Total Protein: 7.9 g/dL (ref 6.5–8.1)

## 2022-03-06 LAB — CBC
HCT: 35.6 % — ABNORMAL LOW (ref 36.0–46.0)
Hemoglobin: 11.9 g/dL — ABNORMAL LOW (ref 12.0–15.0)
MCH: 31.3 pg (ref 26.0–34.0)
MCHC: 33.4 g/dL (ref 30.0–36.0)
MCV: 93.7 fL (ref 80.0–100.0)
Platelets: 329 10*3/uL (ref 150–400)
RBC: 3.8 MIL/uL — ABNORMAL LOW (ref 3.87–5.11)
RDW: 12.6 % (ref 11.5–15.5)
WBC: 11.6 10*3/uL — ABNORMAL HIGH (ref 4.0–10.5)
nRBC: 0 % (ref 0.0–0.2)

## 2022-03-06 MED ORDER — FAMOTIDINE 20 MG PO TABS: 40.0000 mg | ORAL_TABLET | Freq: Every day | ORAL | Status: DC

## 2022-03-06 MED ORDER — FLUCONAZOLE 100 MG PO TABS
100.0000 mg | ORAL_TABLET | Freq: Every day | ORAL | Status: DC
  Administered 2022-03-06 – 2022-03-08 (×3): 100 mg via ORAL
  Filled 2022-03-06 (×3): qty 1

## 2022-03-06 MED ORDER — PANTOPRAZOLE SODIUM 40 MG PO TBEC
40.0000 mg | DELAYED_RELEASE_TABLET | Freq: Every day | ORAL | Status: DC
  Administered 2022-03-06 – 2022-03-08 (×3): 40 mg via ORAL
  Filled 2022-03-06 (×3): qty 1

## 2022-03-06 MED ORDER — FAMOTIDINE 20 MG PO TABS
40.0000 mg | ORAL_TABLET | Freq: Every day | ORAL | Status: DC
  Administered 2022-03-07: 40 mg via ORAL
  Filled 2022-03-06: qty 2

## 2022-03-06 MED ORDER — SODIUM CHLORIDE 0.9 % IV SOLN
40.0000 mg | INTRAVENOUS | Status: DC
  Administered 2022-03-06: 40 mg via INTRAVENOUS
  Filled 2022-03-06: qty 4

## 2022-03-06 MED ORDER — ZOLEDRONIC ACID 4 MG/5ML IV CONC
4.0000 mg | Freq: Once | INTRAVENOUS | Status: AC
  Administered 2022-03-06: 4 mg via INTRAVENOUS
  Filled 2022-03-06: qty 5

## 2022-03-06 MED ORDER — CYCLOBENZAPRINE HCL 10 MG PO TABS
10.0000 mg | ORAL_TABLET | Freq: Every evening | ORAL | Status: DC | PRN
  Administered 2022-03-06: 10 mg via ORAL
  Filled 2022-03-06: qty 1

## 2022-03-06 MED ORDER — MELATONIN 5 MG PO TABS: 10.0000 mg | ORAL_TABLET | Freq: Every evening | ORAL | Status: DC | PRN

## 2022-03-06 MED ORDER — OXYCODONE HCL 5 MG PO TABS: 5.0000 mg | ORAL_TABLET | Freq: Four times a day (QID) | ORAL | Status: DC | PRN

## 2022-03-06 MED ORDER — TEMAZEPAM 15 MG PO CAPS: 15.0000 mg | ORAL_CAPSULE | Freq: Every evening | ORAL | Status: DC | PRN

## 2022-03-06 NOTE — Progress Notes (Signed)
Referring Physician(s): Ennever,P  Supervising Physician: Corrie Mckusick  Patient Status:  [facility] - In-pt  Chief Complaint:  Metastatic pancreatic cancer  Subjective: Pt known to IR service from right liver lesion bx on 02/20/22. She has a PMH significant for asthma, eczema, fibroids, hypertension, obesity, polycystic ovaries and sleep apnea. She has recently been diagnosed with metastatic pancreatic cancer (spinal mets) and request now received for Port-A-Cath placement to assist with treatment.  She was originally scheduled for outpatient port placement tomorrow, however presented to [facility] yesterday with weakness in her legs which has improved with steroid dosing.  She is receiving radiation therapy.  She currently denies fever, chest pain, dyspnea, cough, abdominal pain, vomiting or bleeding.  She has had some mild headaches and intermittent back pain and nausea.  She currently denies weakness or numbness of lower extremities.  Past Medical History:  Diagnosis Date   Asthma    Eczema    Fibroid    H/O oophorectomy    Hypertension    Obesity    Pancreatic adenocarcinoma ([place]) 03/01/2022   Polycystic ovary    Left   Sleep apnea    no CPAP per MD   Past Surgical History:  Procedure Laterality Date   BACK SURGERY     84yr ago   BREAST BIOPSY Right 2019   fibroadenoma   CESAREAN SECTION  02/01/2005   CESAREAN SECTION  2001   LAPAROSCOPIC GASTRIC SLEEVE RESECTION N/A 07/29/2017   Procedure: LAPAROSCOPIC GASTRIC SLEEVE RESECTION WITH UPPER ENDO ;  Surgeon: CClovis Riley MD;  Location: [facility] ORS;  Service: General;  Laterality: N/A;   OOPHORECTOMY     Rt.removed   TUBAL LIGATION  02/01/2005      Allergies: Patient has no known allergies.  Medications: Prior to Admission medications   Medication Sig Start Date End Date Taking? Authorizing Provider  amLODipine (NORVASC) 5 MG tablet TAKE 1 TABLET (5 MG TOTAL) BY MOUTH DAILY. 11/07/21  Yes LRoma SchanzR, DO  calcium citrate (CALCITRATE - DOSED IN MG ELEMENTAL CALCIUM) 950 (200 Ca) MG tablet Take 200 mg of elemental calcium by mouth daily.   Yes [provider]  cyclobenzaprine (FLEXERIL) 10 MG tablet Take 1 tablet (10 mg total) by mouth at bedtime as needed for muscle spasms. 01/25/22  Yes Williams, MJinny BlossomE, NP  fluticasone (FLONASE) 50 MCG/ACT nasal spray SPRAY 2 SPRAYS INTO EACH NOSTRIL EVERY DAY Patient taking differently: Place 2 sprays into both nostrils daily as needed for allergies. 05/11/21  Yes LRoma SchanzR, DO  hydrochlorothiazide (HYDRODIURIL) 25 MG tablet Take 1 tablet (25 mg total) by mouth daily. 12/29/21  Yes LAnn Held DO  Melatonin 10 MG TABS Take 10 mg by mouth at bedtime as needed (sleep).   Yes [provider]  Multiple Vitamins-Minerals (CELEBRATE MULTI-COMPLETE 36 PO) Take 1 each by mouth daily.   Yes [provider]  Multiple Vitamins-Minerals (HAIR SKIN AND NAILS FORMULA PO) Take 1 tablet by mouth daily.   Yes [provider]  nicotine polacrilex (NICORETTE) 4 MG gum Take 4 mg by mouth as needed for smoking cessation.   Yes [provider]  omeprazole (PRILOSEC) 40 MG capsule Take 1 capsule (40 mg total) by mouth daily. Patient taking differently: Take 40 mg by mouth daily as needed (acid reflux). 01/22/22  Yes JMartinique Betty G, MD  oxyCODONE (OXY IR/ROXICODONE) 5 MG immediate release tablet Take 1 tablet (5 mg total) by mouth every  6 (six) hours as needed for severe pain. 03/01/22  Yes Ennever, Rudell Cobb, MD  potassium chloride (KLOR-CON) 10 MEQ tablet TAKE 1 TABLET BY MOUTH EVERY DAY Patient taking differently: Take 10 mEq by mouth daily. 10/11/21  Yes Roma Schanz R, DO  temazepam (RESTORIL) 15 MG capsule Take 1 capsule (15 mg total) by mouth at bedtime as needed for sleep. 02/28/22  Yes Ennever, Rudell Cobb, MD  nicotine (NICODERM CQ - DOSED IN MG/24 HOURS) 14 mg/24hr patch Place 1 patch (14 mg total) onto  the skin daily. Patient not taking: Reported on 02/19/2022 12/08/21   Carollee Herter, Alferd Apa, DO  nicotine (NICODERM CQ) 7 mg/24hr patch Place 1 patch (7 mg total) onto the skin daily. Patient not taking: Reported on 02/19/2022 12/08/21   Carollee Herter, Alferd Apa, DO     Vital Signs: BP 118/65 (BP Location: Right Arm)   Pulse (!) 106   Temp 98.4 F (36.9 C) (Oral)   Resp 18   Ht '5\' 5"'$  (1.651 m)   Wt 198 lb (89.8 kg)   LMP 07/27/2020 (Within Days)   SpO2 95%   BMI 32.95 kg/m   Physical Exam awake, alert.  Chest clear to auscultation bilaterally.  Heart with regular rate and rhythm.  Abdomen soft, positive bowel sounds, currently nontender.  No lower extremity edema.  Moving all fours ok.  Imaging: MR Lumbar Spine W Wo Contrast  Addendum Date: 03/05/2022   ADDENDUM REPORT: 03/05/2022 08:52 ADDENDUM: Study discussed by telephone with Dr. Georgina Snell in the ER on 03/05/2022 at 0838 hours. Electronically Signed   By: Genevie Ann M.D.   On: 03/05/2022 08:52   Result Date: 03/05/2022 CLINICAL DATA:  51 year old female with back and leg pain. Recently diagnosed with metastatic pancreatic adenocarcinoma. Myelopathy. EXAM: MRI LUMBAR SPINE WITHOUT AND WITH CONTRAST TECHNIQUE: Multiplanar and multiecho pulse sequences of the lumbar spine were obtained without and with intravenous contrast. CONTRAST:  26m GADAVIST GADOBUTROL 1 MMOL/ML IV SOLN COMPARISON:  Cervical and thoracic MRI today. Lumbar radiographs this morning. Abdomen MRI 02/09/2022. FINDINGS: Segmentation: Normal, concordant with the cervical and thoracic numbering today. Alignment: Minimal levoconvex lumbar scoliosis. Straightening of lumbar lordosis as demonstrated radiographically today. Vertebrae: Metastatic disease infiltrating the right L1 vertebral body, most of the L3 vertebral body with associated bulky tumor extension into the ventral epidural space and mild pathologic fracture of the L3 inferior endplate (see below), in the  posterosuperior L4 body, heterogeneously infiltrating the L5 body, the S1 body, and with bulky right L5-S1 posterior element tumor with some extraosseous extension (series 1, image 1). Heterogeneous enhancing metastasis also in the right sacral ala and probably the medial right iliac bone both visible on series 13, image 35. Conus medullaris and cauda equina: Conus extends to the L1 level. No lower spinal cord or conus signal abnormality. Malignant spinal stenosis (see below), but no convincing abnormal intradural or cauda equina enhancement. And no definite dural thickening in the lumbar spine. Paraspinal and other soft tissues: Faintly visible liver metastases. Low level but suspicious retroperitoneal lymphadenopathy primarily on the left. Disc levels: Notable for L3: Bulky ventral epidural tumor (series 12, image 20) resulting in moderate to severe spinal stenosis and proximal bilateral L3 foraminal stenosis. L4-L5: Multifactorial moderate to severe spinal stenosis here appears primarily to be degenerative in nature, including bulky ligament flavum hypertrophy and disc bulging. L5-S1: Moderate to severe bilateral L5 foraminal stenosis here appears primarily to be degenerative in nature, but there is early  extraosseous tumor involvement of the right S1 neural foramina suspected. IMPRESSION: 1. Heterogeneous lumbosacral metastatic disease, likely involvement of the right iliac bone also. 2. 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 3. Early tumor infiltration of the right S1 nerve course, in conjunction with L5-S1 posterior element tumor extraosseous extension. 4. Superimposed degenerative moderate to severe spinal stenosis at L4-L5 and foraminal stenosis at the bilateral L5 nerve levels. 5. No cauda equina metastases or lumbar dural metastatic disease identified. Electronically Signed: By: Genevie Ann  M.D. On: 03/05/2022 08:30   MR THORACIC SPINE W WO CONTRAST  Addendum Date: 03/05/2022   ADDENDUM REPORT: 03/05/2022 08:47 ADDENDUM: Study discussed by telephone with Dr. Georgina Snell in the ER on 03/05/2022 at 0838 hours. Electronically Signed   By: Genevie Ann M.D.   On: 03/05/2022 08:47   Result Date: 03/05/2022 CLINICAL DATA:  51 year old female with back and leg pain. Recently diagnosed with metastatic pancreatic adenocarcinoma. Myelopathy. EXAM: MRI THORACIC WITHOUT AND WITH CONTRAST TECHNIQUE: Multiplanar and multiecho pulse sequences of the thoracic spine were obtained without and with intravenous contrast. CONTRAST:  17m GADAVIST GADOBUTROL 1 MMOL/ML IV SOLN COMPARISON:  Cervical spine MRI today reported separately. Abdomen MRI 02/09/2022. FINDINGS: Limited cervical spine imaging:  Reported separately today. Thoracic spine segmentation:  Appears to be normal. Alignment: Mildly exaggerated upper thoracic kyphosis in the setting of T4 pathologic fracture, see below. Elsewhere maintained thoracic kyphosis. Vertebrae: Scattered thoracic vertebral metastatic disease: T2, T4, T9 and T10. Associated left T2 vertebral transverse process and posterior element involvement with extraosseous tumor extension (series 20, image 6), partially involving the posterior rib Associated moderate T4 pathologic compression fracture with some retropulsion of bone and/or tumor, see details below. Bulky posterior element metastasis and extraosseous/epidural tumor eccentric to the right at T9 and T10 (series 26, image 28), with extraosseous tumor extending into and along the right posterior rib, see details below. Benign T12 vertebral body hemangioma. Cord: Effaced ventral spinal cord at T4 secondary to epidural/retropulsed tumor (series 20, image 12). No cord compression or cord signal abnormality there. Mild malignant cord compression at T9 related to right posterior and lateral spinal epidural tumor up to 5 mm in  thickness (series 26, image 28). Cord mass effect when combined with T8-T9 disc bulging there. But no cord edema or myelomalacia. Mild combined degenerative and malignant spinal stenosis at T9-T10. No significant cord mass effect. Lower T10 vertebral level ventral epidural tumor (series 26, image 31) effaces the ventral CSF space with no cord compression. No lower thoracic spinal stenosis and conus medullaris appears to be normal at T12-L1. Abnormal dural thickening and enhancement but no intradural or leptomeningeal enhancement. Paraspinal and other soft tissues: Extraosseous extension of paraspinal tumor on the left at T2, on the right at T9 as above. Trace bilateral layering pleural effusions. Grossly negative visible mediastinum. Multiple liver lesions as described on abdomen MRI last month. Disc levels: Combined thoracic spine degeneration and malignancy resulting in multilevel spinal stenosis, reiterating as above this is most pronounced at T2-T3: Malignant severe left T2 foraminal stenosis. T4: Pathologic compression fracture with retropulsion and/or epidural tumor mild spinal stenosis without compression). T8-T9: Left eccentric disc degeneration and broad-based posterior disc combined on 5 mm thick right posterior and lateral epidural tumor with mild cord compression. No cord edema. Severe combined degenerative and malignant right T8 neural foraminal stenosis. T9-T10: Broad-based and lobulated posterior disc bulging  superimposed on dural thickening. Mild spinal stenosis without cord compression. But severe combined degenerative and malignant right T9 foraminal stenosis. T10-T11: Ventral epidural tumor (series 26, image 31) with mild spinal stenosis, no cord compression. Malignant appearing moderate to severe right T10 foraminal stenosis. IMPRESSION: 1. Multifocal metastatic disease to the Thoracic Spine with pathologic fracture of T4 (moderate) and extraosseous and/or epidural tumor contributing to: - spinal  stenosis at T4, T9, and T10. - Spinal Cord Compression at T9 (mild) with no cord edema. - Severe foraminal neural impingement at the left T2, right T8, T9 and T10 nerve levels. 2. Pronounced posterior dural/epidural tumor at T9 and T10 in association with #1. Extraosseous tumor tracking along the posterior ribs on the left at T2, on the right at T9. 3. Liver metastases.  Trace layering pleural effusions. Electronically Signed: By: Genevie Ann M.D. On: 03/05/2022 08:22   MR Cervical Spine W or Wo Contrast  Addendum Date: 03/05/2022   ADDENDUM REPORT: 03/05/2022 08:44 ADDENDUM: Study discussed by telephone with Dr. Georgina Snell in the ER on 03/05/2022 at 0838 hours. Electronically Signed   By: Genevie Ann M.D.   On: 03/05/2022 08:44   Result Date: 03/05/2022 CLINICAL DATA:  51 year old female with back and leg pain. Recently diagnosed with metastatic pancreatic adenocarcinoma. Myelopathy. EXAM: MRI CERVICAL SPINE WITHOUT AND WITH CONTRAST TECHNIQUE: Multiplanar and multiecho pulse sequences of the cervical spine, to include the craniocervical junction and cervicothoracic junction, were obtained without and with intravenous contrast. CONTRAST:  72m GADAVIST GADOBUTROL 1 MMOL/ML IV SOLN COMPARISON:  Brain MRI 09/15/2020.  Cervical spine CT 09/30/2020. FINDINGS: Alignment: Chronic straightening of cervical lordosis. Mild reversal is new from last year. No significant spondylolisthesis. Vertebrae: Visible skull base bone marrow signal is stable since last year and within normal limits. C1 through C3 appear to remain normal. But confluent abnormal marrow signal and heterogeneous enhancement throughout the C4 and C5 vertebral bodies consistent with metastatic disease. Some posterior element involvement at both levels. The C6 vertebra appears largely spared. But there is patchy metastatic involvement of the left C7 vertebral body suspected (series 7, image 10). No definite pathologic compression fracture at this  time. Upper thoracic metastases are reported separately today. Cord: Abnormal cervical spinal cord mass effect from C3-C4 through C5-C6 is in part degenerative (calcified ligament flavum hypertrophy on the right at C3-C4 series 8, image 14) but appears partially malignant due to abnormal dural thickening and enhancement from C3-C7 (series 11, image 7). Circumferential abnormal dura or epidural space at both the C4 and C5 levels (series 12, image 25). Multilevel mild spinal cord compression with no convincing cord edema. No myelomalacia. Below C5 the spinal canal and cord appear normal. No abnormal intramedullary enhancement. Posterior Fossa, vertebral arteries, paraspinal tissues: Cervicomedullary junction is within normal limits. Visible posterior fossa appears stable and negative. Preserved major vascular flow voids in the neck with dominant right vertebral artery. However, questionable extraosseous paraspinal muscle and transverse foraminal tumor on the left at C4 (series 12, image 20). There seems to be mass effect on the non dominant left vertebral artery there. And there is also trace abnormal prevertebral fluid from C2-C5 (series 7, image 8). But no generalized paraspinal soft tissue inflammation. Disc levels: Superimposed degenerative cervical spinal cord changes contributing to spinal stenosis and mild spinal cord mass effect at: C3-C4: Circumferential disc osteophyte complex and chronic calcified right ligament flavum hypertrophy (series 8, image 14). C4-C5: Circumferential disc bulging with a broad-based posterior component. C5-C6: Disc space  loss with circumferential disc osteophyte complex. Broad-based posterior component. IMPRESSION: 1. Positive for Cervical Spine Metastatic disease. Dominant C4 and C5 vertebra body metastases, with associated abnormal dural thickening and enhancement C3 through C6, and suspicion of left C4 extraosseous tumor extension affecting the C5 neural foramen and non dominant  left vertebral artery there. 2. Combined degenerative and malignant spinal stenosis with Mild Spinal Cord Compression from C3-C4 through C5-C6. No convincing cord edema, myelomalacia. No intramedullary spinal cord metastasis. 3. Trace prevertebral space fluid from C2 to C5 appears reactive. No strong evidence of spinal infection, although could appear similar to the above. 4. Thoracic spinal metastases detailed separately today. Electronically Signed: By: Genevie Ann M.D. On: 03/05/2022 08:08   DG Lumbar Spine Complete  Result Date: 03/05/2022 CLINICAL DATA:  51 year old female with back and leg pain. No known injury. Recently diagnosed metastatic Pancreatic adenocarcinoma. EXAM: LUMBAR SPINE - COMPLETE 4+ VIEW COMPARISON:  Chest CT 02/15/2022. CT Abdomen and Pelvis 02/08/2022. FINDINGS: Normal lumbar segmentation. Bone mineralization is within normal limits. Stable lumbar lordosis from the CT last month. Subtle anterolisthesis of L4 on L5. L3-L4 through L5-S1 disc space loss and endplate spurring, severe at L5-S1. Vacuum disc there and at L4-L5 on the CT last month. No pars fracture. Visible lower thoracic levels appear intact. Visible sacrum appears intact. No acute osseous abnormality identified. Grossly negative visible lung bases. Non obstructed bowel gas pattern. IMPRESSION: No acute osseous abnormality identified in the lumbar spine. Disc and endplate degeneration U2-V2 through L5-S1, severe at the latter. Electronically Signed   By: Genevie Ann M.D.   On: 03/05/2022 06:36    Labs:  CBC: Recent Labs    02/20/22 1128 02/28/22 1343 03/05/22 0550 03/06/22 0523  WBC 5.0 6.4 7.2 11.6*  HGB 12.0 11.8* 12.2 11.9*  HCT 34.4* 35.7* 36.7 35.6*  PLT 245 272 322 329    COAGS: Recent Labs    02/20/22 1128  INR 1.0    BMP: Recent Labs    02/19/22 1039 02/28/22 1343 03/05/22 0550 03/06/22 0523  NA 141 140 136 137  K 3.4* 4.0 3.5 3.7  CL 103 102 99 103  CO2 '28 28 26 24  '$ GLUCOSE 102* 127* 124*  145*  BUN '9 12 12 16  '$ CALCIUM 10.0 10.3 10.0 10.2  CREATININE 0.65 0.73 0.54 0.58  GFRNONAA >60 >60 >60 >60    LIVER FUNCTION TESTS: Recent Labs    02/19/22 1039 02/28/22 1343 03/05/22 0550 03/06/22 0523  BILITOT 0.7 0.6 0.9 0.6  AST '20 31 30 24  '$ ALT '14 27 29 25  '$ ALKPHOS 96 125 125 117  PROT 7.5 8.2* 8.7* 7.9  ALBUMIN 4.3 4.4 4.0 3.8    Assessment and Plan: Pt known to IR service from right liver lesion bx on 02/20/22. She has a PMH significant for asthma, eczema, fibroids, hypertension, obesity, polycystic ovaries and sleep apnea. She has recently been diagnosed with metastatic pancreatic cancer (spinal mets) and request now received for Port-A-Cath placement to assist with treatment.  She was originally scheduled for outpatient port placement tomorrow, however presented to [facility] yesterday with weakness in her legs which has improved with steroid dosing.  She is receiving radiation therapy.Risks and benefits of image guided port-a-catheter placement was discussed with the patient including, but not limited to bleeding, infection, pneumothorax, or fibrin sheath development and need for additional procedures.  All of the patient's questions were answered, patient is agreeable to proceed. Consent signed and in chart.  Procedure  scheduled for either today or tomorrow.   Electronically Signed: D. Rowe Robert, PA-C 03/06/2022, 9:32 AM   I spent a total of 20 minutes at the the patient's bedside AND on the patient's hospital floor or unit, greater than 50% of which was counseling/coordinating care for Port-A-Cath placement    Patient ID: Makayla Good, female   DOB: April 14, 1971, 51 y.o.   MRN: [id number]

## 2022-03-06 NOTE — Progress Notes (Signed)
PROGRESS NOTE    Makayla Good  TDS:[id number] DOB: Aug 29, 1970 DOA: 03/05/2022 PCP: [name], DO   Brief Narrative:  Makayla Good is a 51 y.o. female with medical history significant of HTN, HLD, S4 pancreatic adenocarcinoma. Presented with thigh pain. Her symptoms started 3 days ago. She noticed that she was having an achy pain primarily in her right thigh. She didn't have any numbness or tingling there. She did not notice any dragging of her feet. She didn't have any incontinence of bowel or bladder; although, she has noticed increased frequency. She was concerned that she was having some effects from her new pain meds. She states that her pain medicine wasn't completely relieving the pain anyway. When her symptoms persisted, she decided to come to the ED for evaluation. She subsequently had an MRI of the back done.  This showed she had extensive spinal metastasis.  She had impending cord compression.  She was started on IV Solu-Medrol, radiation oncology saw her, plan is to provide 10 cycles of daily radiation starting 03/06/2022.  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  Patient has been seen by radiation oncology as well as oncology.  They recommend continuing IV Solu-Medrol and she will start on radiation therapy today.  Appreciate both specialties.  Patient is feeling much better, she has no complaints at all.  Essential hypertension: Blood pressure controlled, continue to hold antihypertensives.  DVT prophylaxis: SCDs Start: 03/05/22 1606   Code Status: Full Code  Family Communication:  None  present at bedside.  Plan of care discussed with patient in length and he/she verbalized understanding and agreed with it.  Status is: Observation The patient will require care spanning > 2 midnights and should be moved to inpatient because: She is a starting radiation therapy today.   Estimated body mass index is 32.95 kg/m as calculated from the following:   Height as of this encounter: '5\' 5"'$  (1.651 m).   Weight as of this encounter: 89.8 kg.    Nutritional Assessment: Body mass index is 32.95 kg/m.Marland Kitchen Seen by dietician.  I agree with the assessment and plan as outlined below: Nutrition Status:        . Skin Assessment: I have examined the patient's skin and I agree with the wound assessment as performed by the wound care RN as outlined below:    Consultants:  Radiation oncology Oncology  Procedures:  None  Antimicrobials:  Anti-infectives (From admission, onward)    Start     Dose/Rate Route Frequency Ordered Stop   03/06/22 1000  fluconazole (DIFLUCAN) tablet 100 mg        100 mg Oral Daily 03/06/22 0716     03/05/22 0930  fluconazole (DIFLUCAN) tablet 100 mg  Status:  Discontinued        100 mg Oral  Once 03/05/22 0915 03/05/22 0933         Subjective: Patient seen and examined.  She says that she feels much better than yesterday.  No new complaint.  Objective: Vitals:   03/05/22 2011 03/06/22 0010 03/06/22 0449 03/06/22 0818  BP: 118/78 125/81 (!) 125/92 118/65  Pulse: 93 90 (!) 107 (!) 106  Resp: '20 18 16 18  '$ Temp: 98 F (36.7 C) 99 F (37.2 C) 98.2 F (36.8 C) 98.4 F (36.9 C)  TempSrc: Oral Oral Oral Oral  SpO2: 98% 94% 98% 95%  Weight:      Height:        Intake/Output Summary (Last 24 hours) at 03/06/2022 1153 Last data filed at 03/06/2022 0830 Gross per 24 hour  Intake 1260.06 ml  Output --  Net 1260.06 ml   Filed Weights   03/05/22 0420  Weight: 89.8 kg    Examination:  General exam: Appears calm and comfortable  Respiratory  system: Clear to auscultation. Respiratory effort normal. Cardiovascular system: S1 & S2 heard, RRR. No JVD, murmurs, rubs, gallops or clicks. No pedal edema. Gastrointestinal system: Abdomen is nondistended, soft and nontender. No organomegaly or masses felt. Normal bowel sounds heard. Central nervous system: Alert and oriented. No focal neurological deficits. Extremities: Symmetric 5 x 5 power. Skin: No rashes, lesions or ulcers Psychiatry: Judgement and insight appear normal. Mood & affect appropriate.    Data Reviewed: I have personally reviewed following labs and imaging studies  CBC: Recent Labs  Lab 02/28/22 1343 03/05/22 0550 03/06/22 0523  WBC 6.4 7.2 11.6*  NEUTROABS 3.6 4.1  --   HGB 11.8* 12.2 11.9*  HCT 35.7* 36.7 35.6*  MCV 94.7 92.9 93.7  PLT 272 322 161   Basic Metabolic Panel: Recent Labs  Lab 02/28/22 1343 03/05/22 0550 03/06/22 0523  NA 140 136 137  K 4.0 3.5 3.7  CL 102 99 103  CO2 '28 26 24  '$ GLUCOSE 127* 124* 145*  BUN '12 12 16  '$ CREATININE 0.73 0.54 0.58  CALCIUM 10.3 10.0 10.2   GFR: Estimated Creatinine Clearance: 92.1 mL/min (by C-G formula based on SCr of 0.58 mg/dL). Liver Function Tests: Recent Labs  Lab 02/28/22 1343 03/05/22 0550 03/06/22 0523  AST '31 30 24  '$ ALT '27 29 25  '$ ALKPHOS 125 125 117  BILITOT 0.6 0.9 0.6  PROT 8.2* 8.7* 7.9  ALBUMIN 4.4 4.0 3.8   No results for input(s): "LIPASE", "AMYLASE" in the last 168 hours. No results for input(s): "AMMONIA" in the last 168 hours. Coagulation Profile: No results for input(s): "INR", "PROTIME" in the last 168 hours. Cardiac Enzymes: No results for input(s): "CKTOTAL", "CKMB", "CKMBINDEX", "TROPONINI" in the last 168 hours. BNP (last 3 results) No results for input(s): "PROBNP" in the last 8760 hours. HbA1C: No results for input(s): "HGBA1C" in the last 72 hours. CBG: No results for input(s): "GLUCAP" in the last 168 hours. Lipid Profile: No results for input(s): "CHOL", "HDL",  "LDLCALC", "TRIG", "CHOLHDL", "LDLDIRECT" in the last 72 hours. Thyroid Function Tests: No results for input(s): "TSH", "T4TOTAL", "FREET4", "T3FREE", "THYROIDAB" in the last 72 hours. Anemia Panel: No results for input(s): "VITAMINB12", "FOLATE", "FERRITIN", "TIBC", "IRON", "RETICCTPCT" in the last 72 hours. Sepsis Labs: No results for input(s): "PROCALCITON", "LATICACIDVEN" in the last 168 hours.  No results found for this or any previous visit (from the past 240 hour(s)).   Radiology Studies: MR Lumbar Spine W Wo Contrast  Addendum Date: 03/05/2022   ADDENDUM REPORT: 03/05/2022 08:52 ADDENDUM: Study discussed by telephone with Dr. Georgina Snell in the ER on 03/05/2022 at 0838 hours. Electronically Signed   By: Genevie Ann M.D.   On: 03/05/2022 08:52   Result Date: 03/05/2022 CLINICAL DATA:  51 year old female with back and leg pain. Recently diagnosed with metastatic pancreatic adenocarcinoma.  Myelopathy. EXAM: MRI LUMBAR SPINE WITHOUT AND WITH CONTRAST TECHNIQUE: Multiplanar and multiecho pulse sequences of the lumbar spine were obtained without and with intravenous contrast. CONTRAST:  65m GADAVIST GADOBUTROL 1 MMOL/ML IV SOLN COMPARISON:  Cervical and thoracic MRI today. Lumbar radiographs this morning. Abdomen MRI 02/09/2022. FINDINGS: Segmentation: Normal, concordant with the cervical and thoracic numbering today. Alignment: Minimal levoconvex lumbar scoliosis. Straightening of lumbar lordosis as demonstrated radiographically today. Vertebrae: Metastatic disease infiltrating the right L1 vertebral body, most of the L3 vertebral body with associated bulky tumor extension into the ventral epidural space and mild pathologic fracture of the L3 inferior endplate (see below), in the posterosuperior L4 body, heterogeneously infiltrating the L5 body, the S1 body, and with bulky right L5-S1 posterior element tumor with some extraosseous extension (series 1, image 1). Heterogeneous enhancing  metastasis also in the right sacral ala and probably the medial right iliac bone both visible on series 13, image 35. Conus medullaris and cauda equina: Conus extends to the L1 level. No lower spinal cord or conus signal abnormality. Malignant spinal stenosis (see below), but no convincing abnormal intradural or cauda equina enhancement. And no definite dural thickening in the lumbar spine. Paraspinal and other soft tissues: Faintly visible liver metastases. Low level but suspicious retroperitoneal lymphadenopathy primarily on the left. Disc levels: Notable for L3: Bulky ventral epidural tumor (series 12, image 20) resulting in moderate to severe spinal stenosis and proximal bilateral L3 foraminal stenosis. L4-L5: Multifactorial moderate to severe spinal stenosis here appears primarily to be degenerative in nature, including bulky ligament flavum hypertrophy and disc bulging. L5-S1: Moderate to severe bilateral L5 foraminal stenosis here appears primarily to be degenerative in nature, but there is early extraosseous tumor involvement of the right S1 neural foramina suspected. IMPRESSION: 1. Heterogeneous lumbosacral metastatic disease, likely involvement of the right iliac bone also. 2. 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 3. Early tumor infiltration of the right S1 nerve course, in conjunction with L5-S1 posterior element tumor extraosseous extension. 4. Superimposed degenerative moderate to severe spinal stenosis at L4-L5 and foraminal stenosis at the bilateral L5 nerve levels. 5. No cauda equina metastases or lumbar dural metastatic disease identified. Electronically Signed: By: HGenevie AnnM.D. On: 03/05/2022 08:30   MR THORACIC SPINE W WO CONTRAST  Addendum Date: 03/05/2022   ADDENDUM REPORT: 03/05/2022 08:47 ADDENDUM: Study discussed by telephone with Dr. VGeorgina Snellin the ER on  03/05/2022 at 0838 hours. Electronically Signed   By: HGenevie AnnM.D.   On: 03/05/2022 08:47   Result Date: 03/05/2022 CLINICAL DATA:  F67year old female with back and leg pain. Recently diagnosed with metastatic pancreatic adenocarcinoma. Myelopathy. EXAM: MRI THORACIC WITHOUT AND WITH CONTRAST TECHNIQUE: Multiplanar and multiecho pulse sequences of the thoracic spine were obtained without and with intravenous contrast. CONTRAST:  921mGADAVIST GADOBUTROL 1 MMOL/ML IV SOLN COMPARISON:  Cervical spine MRI today reported separately. Abdomen MRI 02/09/2022. FINDINGS: Limited cervical spine imaging:  Reported separately today. Thoracic spine segmentation:  Appears to be normal. Alignment: Mildly exaggerated upper thoracic kyphosis in the setting of T4 pathologic fracture, see below. Elsewhere maintained thoracic kyphosis. Vertebrae: Scattered thoracic vertebral metastatic disease: T2, T4, T9 and T10. Associated left T2 vertebral transverse process and posterior element involvement with extraosseous tumor extension (series 20, image 6), partially involving the posterior rib Associated moderate T4 pathologic compression fracture with some retropulsion of bone and/or tumor,  see details below. Bulky posterior element metastasis and extraosseous/epidural tumor eccentric to the right at T9 and T10 (series 26, image 28), with extraosseous tumor extending into and along the right posterior rib, see details below. Benign T12 vertebral body hemangioma. Cord: Effaced ventral spinal cord at T4 secondary to epidural/retropulsed tumor (series 20, image 12). No cord compression or cord signal abnormality there. Mild malignant cord compression at T9 related to right posterior and lateral spinal epidural tumor up to 5 mm in thickness (series 26, image 28). Cord mass effect when combined with T8-T9 disc bulging there. But no cord edema or myelomalacia. Mild combined degenerative and malignant spinal stenosis at T9-T10. No  significant cord mass effect. Lower T10 vertebral level ventral epidural tumor (series 26, image 31) effaces the ventral CSF space with no cord compression. No lower thoracic spinal stenosis and conus medullaris appears to be normal at T12-L1. Abnormal dural thickening and enhancement but no intradural or leptomeningeal enhancement. Paraspinal and other soft tissues: Extraosseous extension of paraspinal tumor on the left at T2, on the right at T9 as above. Trace bilateral layering pleural effusions. Grossly negative visible mediastinum. Multiple liver lesions as described on abdomen MRI last month. Disc levels: Combined thoracic spine degeneration and malignancy resulting in multilevel spinal stenosis, reiterating as above this is most pronounced at T2-T3: Malignant severe left T2 foraminal stenosis. T4: Pathologic compression fracture with retropulsion and/or epidural tumor mild spinal stenosis without compression). T8-T9: Left eccentric disc degeneration and broad-based posterior disc combined on 5 mm thick right posterior and lateral epidural tumor with mild cord compression. No cord edema. Severe combined degenerative and malignant right T8 neural foraminal stenosis. T9-T10: Broad-based and lobulated posterior disc bulging superimposed on dural thickening. Mild spinal stenosis without cord compression. But severe combined degenerative and malignant right T9 foraminal stenosis. T10-T11: Ventral epidural tumor (series 26, image 31) with mild spinal stenosis, no cord compression. Malignant appearing moderate to severe right T10 foraminal stenosis. IMPRESSION: 1. Multifocal metastatic disease to the Thoracic Spine with pathologic fracture of T4 (moderate) and extraosseous and/or epidural tumor contributing to: - spinal stenosis at T4, T9, and T10. - Spinal Cord Compression at T9 (mild) with no cord edema. - Severe foraminal neural impingement at the left T2, right T8, T9 and T10 nerve levels. 2. Pronounced posterior  dural/epidural tumor at T9 and T10 in association with #1. Extraosseous tumor tracking along the posterior ribs on the left at T2, on the right at T9. 3. Liver metastases.  Trace layering pleural effusions. Electronically Signed: By: Genevie Ann M.D. On: 03/05/2022 08:22   MR Cervical Spine W or Wo Contrast  Addendum Date: 03/05/2022   ADDENDUM REPORT: 03/05/2022 08:44 ADDENDUM: Study discussed by telephone with Dr. Georgina Snell in the ER on 03/05/2022 at 0838 hours. Electronically Signed   By: Genevie Ann M.D.   On: 03/05/2022 08:44   Result Date: 03/05/2022 CLINICAL DATA:  51 year old female with back and leg pain. Recently diagnosed with metastatic pancreatic adenocarcinoma. Myelopathy. EXAM: MRI CERVICAL SPINE WITHOUT AND WITH CONTRAST TECHNIQUE: Multiplanar and multiecho pulse sequences of the cervical spine, to include the craniocervical junction and cervicothoracic junction, were obtained without and with intravenous contrast. CONTRAST:  42m GADAVIST GADOBUTROL 1 MMOL/ML IV SOLN COMPARISON:  Brain MRI 09/15/2020.  Cervical spine CT 09/30/2020. FINDINGS: Alignment: Chronic straightening of cervical lordosis. Mild reversal is new from last year. No significant spondylolisthesis. Vertebrae: Visible skull base bone marrow signal is stable since last year and within normal  limits. C1 through C3 appear to remain normal. But confluent abnormal marrow signal and heterogeneous enhancement throughout the C4 and C5 vertebral bodies consistent with metastatic disease. Some posterior element involvement at both levels. The C6 vertebra appears largely spared. But there is patchy metastatic involvement of the left C7 vertebral body suspected (series 7, image 10). No definite pathologic compression fracture at this time. Upper thoracic metastases are reported separately today. Cord: Abnormal cervical spinal cord mass effect from C3-C4 through C5-C6 is in part degenerative (calcified ligament flavum hypertrophy on  the right at C3-C4 series 8, image 14) but appears partially malignant due to abnormal dural thickening and enhancement from C3-C7 (series 11, image 7). Circumferential abnormal dura or epidural space at both the C4 and C5 levels (series 12, image 25). Multilevel mild spinal cord compression with no convincing cord edema. No myelomalacia. Below C5 the spinal canal and cord appear normal. No abnormal intramedullary enhancement. Posterior Fossa, vertebral arteries, paraspinal tissues: Cervicomedullary junction is within normal limits. Visible posterior fossa appears stable and negative. Preserved major vascular flow voids in the neck with dominant right vertebral artery. However, questionable extraosseous paraspinal muscle and transverse foraminal tumor on the left at C4 (series 12, image 20). There seems to be mass effect on the non dominant left vertebral artery there. And there is also trace abnormal prevertebral fluid from C2-C5 (series 7, image 8). But no generalized paraspinal soft tissue inflammation. Disc levels: Superimposed degenerative cervical spinal cord changes contributing to spinal stenosis and mild spinal cord mass effect at: C3-C4: Circumferential disc osteophyte complex and chronic calcified right ligament flavum hypertrophy (series 8, image 14). C4-C5: Circumferential disc bulging with a broad-based posterior component. C5-C6: Disc space loss with circumferential disc osteophyte complex. Broad-based posterior component. IMPRESSION: 1. Positive for Cervical Spine Metastatic disease. Dominant C4 and C5 vertebra body metastases, with associated abnormal dural thickening and enhancement C3 through C6, and suspicion of left C4 extraosseous tumor extension affecting the C5 neural foramen and non dominant left vertebral artery there. 2. Combined degenerative and malignant spinal stenosis with Mild Spinal Cord Compression from C3-C4 through C5-C6. No convincing cord edema, myelomalacia. No intramedullary  spinal cord metastasis. 3. Trace prevertebral space fluid from C2 to C5 appears reactive. No strong evidence of spinal infection, although could appear similar to the above. 4. Thoracic spinal metastases detailed separately today. Electronically Signed: By: Genevie Ann M.D. On: 03/05/2022 08:08   DG Lumbar Spine Complete  Result Date: 03/05/2022 CLINICAL DATA:  51 year old female with back and leg pain. No known injury. Recently diagnosed metastatic Pancreatic adenocarcinoma. EXAM: LUMBAR SPINE - COMPLETE 4+ VIEW COMPARISON:  Chest CT 02/15/2022. CT Abdomen and Pelvis 02/08/2022. FINDINGS: Normal lumbar segmentation. Bone mineralization is within normal limits. Stable lumbar lordosis from the CT last month. Subtle anterolisthesis of L4 on L5. L3-L4 through L5-S1 disc space loss and endplate spurring, severe at L5-S1. Vacuum disc there and at L4-L5 on the CT last month. No pars fracture. Visible lower thoracic levels appear intact. Visible sacrum appears intact. No acute osseous abnormality identified. Grossly negative visible lung bases. Non obstructed bowel gas pattern. IMPRESSION: No acute osseous abnormality identified in the lumbar spine. Disc and endplate degeneration A6-T0 through L5-S1, severe at the latter. Electronically Signed   By: Genevie Ann M.D.   On: 03/05/2022 06:36    Scheduled Meds:  docusate sodium  100 mg Oral BID   fluconazole  100 mg Oral Daily   pantoprazole  40 mg Oral Daily  Continuous Infusions:  dexamethasone (DECADRON) IVPB ([facility]) 20 mg (03/06/22 7829)   famotidine (PEPCID) IV 40 mg (03/06/22 0931)     LOS: 0 days   Darliss Cheney, MD [facility]  03/06/2022, 11:53 AM   *Please note that this is a verbal dictation therefore any spelling or grammatical errors are due to the "[employer] [employer]" system interpretation.  Please page via [place] and do not message via secure chat for urgent patient care matters. Secure chat can be used for non urgent patient care  matters.  How to contact the [facility] Attending or Consulting provider [place] or covering provider during after hours [facility], for this patient?  Check the care team in [facility] and look for a) attending/consulting [facility] provider listed and b) the [facility] team listed. Page or secure chat [phone]. Log into [email or web address] and use [facility]'s universal password to access. If you do not have the password, please contact the hospital operator. Locate the [facility] provider you are looking for under [facility] and page to a number that you can be directly reached. If you still have difficulty reaching the provider, please page the [facility] (Director on Call) for the Hospitalists listed on amion for assistance.

## 2022-03-06 NOTE — Consult Note (Signed)
$'@LOGO'A$ @ Hematology and Oncology Follow Up Visit  Makayla Good [id number] [birth year] 51 y.o. 03/06/2022   Principle Diagnosis:  Metastatic pancreatic cancer-spinal metastasis  Current Therapy:   XRT to start today.     Interim History:  Makayla Good is well-known to me.  She is a very nice 51 year old African-American female.  She saw her a week ago.  At the time, we had made a diagnosis of metastatic pancreatic cancer.  She was to start chemotherapy on 03/19/2022.  Over the weekend, she began to have more leg pain.  She was having some weakness in the legs.  She had no bowel or bladder incontinence.  Pressure went to the emergency room.  She subsequently had an MRI of the back done.  This showed she had extensive spinal metastasis.  She had impending cord compression.  She was started on steroids.  She still is going was admitted.  She has been seen by Radiation Oncology.  They will start her treatment today.  She actually looks better than I would have thought.  Her she has her legs feel better.  I am sure that the steroids probably have helped a little bit.  She has had no bowel or bladder incontinence.  She has had no bleeding.  She has had no tingling or numbness in the legs.  She has had no nausea or vomiting.  There is been no cough or shortness of breath.  She has had no headache.  Her appetite has been okay.  Overall, I would have said that her performance status is probably ECOG 1.  Medications:  Current Facility-Administered Medications:    acetaminophen (TYLENOL) tablet 650 mg, 650 mg, Oral, Q6H PRN **OR** acetaminophen (TYLENOL) suppository 650 mg, 650 mg, Rectal, Q6H PRN, Marylyn Ishihara, Tyrone A, DO   dexamethasone (DECADRON) 20 mg in sodium chloride 0.9 % 50 mL IVPB, 20 mg, Intravenous, Daily, Wofford, Drew A, RPH   docusate sodium (COLACE) capsule 100 mg, 100 mg, Oral, BID, Kyle, Tyrone A, DO, 100 mg at 03/05/22 2148   famotidine (PEPCID) 40 mg in sodium chloride  0.9 % 50 mL IVPB, 40 mg, Intravenous, Q24H, Adira Limburg, Rudell Cobb, MD   fluconazole (DIFLUCAN) tablet 100 mg, 100 mg, Oral, Daily, Lonnette Shrode, Rudell Cobb, MD   morphine (PF) 4 MG/ML injection 4 mg, 4 mg, Intravenous, Q2H PRN, Marylyn Ishihara, Tyrone A, DO, 4 mg at 03/05/22 2126   ondansetron (ZOFRAN) tablet 4 mg, 4 mg, Oral, Q6H PRN **OR** ondansetron (ZOFRAN) injection 4 mg, 4 mg, Intravenous, Q6H PRN, Marylyn Ishihara, Tyrone A, DO   oxyCODONE (Oxy IR/ROXICODONE) immediate release tablet 5 mg, 5 mg, Oral, Q4H PRN, Marylyn Ishihara, Tyrone A, DO   zoledronic acid (ZOMETA) 4 mg in sodium chloride 0.9 % 100 mL IVPB, 4 mg, Intravenous, Once, Adama Ferber, Rudell Cobb, MD  Allergies: No Known Allergies  Past Medical History, Surgical history, Social history, and Family History were reviewed and updated.  Review of Systems: Review of Systems  Constitutional: Negative.   HENT:  Negative.    Eyes: Negative.   Respiratory: Negative.    Cardiovascular: Negative.   Gastrointestinal: Negative.   Endocrine: Negative.   Genitourinary: Negative.    Musculoskeletal:  Positive for arthralgias, back pain and myalgias.  Skin: Negative.   Neurological:  Positive for extremity weakness. Negative for speech difficulty.  Hematological: Negative.   Psychiatric/Behavioral: Negative.      Physical Exam:  height is '5\' 5"'$  (1.651 m) and weight is 198 lb (89.8 kg). Her oral temperature is  98.2 F (36.8 C). Her blood pressure is 125/92 (abnormal) and her pulse is 107 (abnormal). Her respiration is 16 and oxygen saturation is 98%.   Wt Readings from Last 3 Encounters:  03/05/22 198 lb (89.8 kg)  02/28/22 198 lb (89.8 kg)  02/20/22 200 lb (90.7 kg)    Physical Exam Vitals reviewed.  HENT:     Head: Normocephalic and atraumatic.  Eyes:     Pupils: Pupils are equal, round, and reactive to light.  Cardiovascular:     Rate and Rhythm: Normal rate and regular rhythm.     Heart sounds: Normal heart sounds.  Pulmonary:     Effort: Pulmonary effort is normal.      Breath sounds: Normal breath sounds.  Abdominal:     General: Bowel sounds are normal.     Palpations: Abdomen is soft.  Musculoskeletal:        General: No tenderness or deformity. Normal range of motion.     Cervical back: Normal range of motion.     Comments: Extremity shows good range of motion of her legs.  She has good sensation of her legs.  She can move all joints without difficulty.  She really has decent strength bilaterally.  I really cannot detect any focal weakness.  Lymphadenopathy:     Cervical: No cervical adenopathy.  Skin:    General: Skin is warm and dry.     Findings: No erythema or rash.  Neurological:     Mental Status: She is alert and oriented to person, place, and time.  Psychiatric:        Behavior: Behavior normal.        Thought Content: Thought content normal.        Judgment: Judgment normal.      Lab Results  Component Value Date   WBC 11.6 (H) 03/06/2022   HGB 11.9 (L) 03/06/2022   HCT 35.6 (L) 03/06/2022   MCV 93.7 03/06/2022   PLT 329 03/06/2022   '@LASTCHEMISTRY'$ @    Impression and Plan: Makayla Good is a very nice 51 year old Afro-American female.  She has metastatic pancreatic cancer.  This is certainly a very unusual way for a pancreatic cancer to spread.  She has extensive spinal metastasis.  We do not want her to develop actual cord compression.  The steroids have helped.  I really cannot detect any neurological deficits this morning.  She will have 10 radiation treatments.  Hopefully, if she does well, she will be able to take treatment as an outpatient.  She will continue on steroids.  I would have her on the IV steroid for right now.  We can subsequently switch over to oral steroids.  She will need Pepcid to help with gastritis.  She will also need Diflucan help prevent oral thrush from the steroids.  While she is in the hospital, we should get a Port-A-Cath placed.  This will be a lot easier for her if she is  inpatient.  Again this is a very challenging situation.  She is in good shape.  We can be aggressive.  My plan is to give systemic chemotherapy with FOLFIRINOX.  I still think this would be helpful.  We had her set up to start on November 27.  We may have to move that back in lieu of her radiation.  She will get Zometa in the hospital.  We will certainly follow along closely.  I do appreciate the great care that she will get from everybody upon 6  Conan Bowens, MD  Hebrews 12:12   Volanda Napoleon, MD 11/14/20237:17 AM

## 2022-03-06 NOTE — Consult Note (Signed)
Consultation Note Date: 03/06/2022   Patient Name: Makayla Good  DOB: 01/25/1971  MRN: [id number]  Age / Sex: 51 y.o., female  PCP: Carollee Herter, Alferd Apa, DO Referring Physician: Darliss Cheney, MD  Reason for Consultation: Establishing goals of care  HPI/Patient Profile: 51 y.o. female admitted on 03/05/2022     Clinical Assessment and Goals of Care: [age]-year-old lady with asthma eczema fibroids hypertension obesity polycystic ovary sleep apnea, recently diagnosed with metastatic pancreatic cancer, now found to have spinal mets, will establish with Dr. [name] with medical oncology. Palliative consult for ongoing goals of care discussions and for additional support has been requested. Patient is awake alert resting in bed. Palliative medicine is specialized medical care for people living with serious illness. It focuses on providing relief from the symptoms and stress of a serious illness. The goal is to improve quality of life for both the patient and the family. Goals of care: Broad aims of medical therapy in relation to the patient's values and preferences. Our aim is to provide medical care aimed at enabling patients to achieve the goals that matter most to them, given the circumstances of their particular medical situation and their constraints.  Brief life review performed.  Patient lives at home with her husband and 2 sons.  Patient works at [employer] of [place].  Patient's husband had a stroke and if she wishes to designate her oldest son who is 72 years old to be her healthcare power of attorney agent.  Wishes to continue full code, full scope care and is tolerating radiation, is to undergo Port-A-Cath placement today.  Patient has a Bible by her bedside, offered active listening and supportive care, see below  NEXT OF KIN Has husband, has 2 sons.  SUMMARY OF RECOMMENDATIONS   Full  code/full scope care for now Patient to undergo Port-A-Cath placement Medical oncology following Chaplain consult for patient of advance care planning documents, creation of living will and designation of healthcare power of attorney agent.  Code Status/Advance Care Planning: Full code   Symptom Management:    Palliative Prophylaxis:  Frequent Pain Assessment  Additional Recommendations (Limitations, Scope, Preferences): Full Scope Treatment  Psycho-social/Spiritual:  Desire for further Chaplaincy support:yes Additional Recommendations: Caregiving  Support/Resources  Prognosis:  Unable to determine  Discharge Planning: To Be Determined      Primary Diagnoses: Present on Admission:  Metastatic cancer to spine ([facility])  HTN (hypertension)  HLD (hyperlipidemia)  Pancreatic cancer ([facility])   I have reviewed the medical record, interviewed the patient and family, and examined the patient. The following aspects are pertinent.  Past Medical History:  Diagnosis Date   Asthma    Eczema    Fibroid    H/O oophorectomy    Hypertension    Obesity    Pancreatic adenocarcinoma ([facility]) 03/01/2022   Polycystic ovary    Left   Sleep apnea    no CPAP per MD   Social History   Socioeconomic History   Marital status: Married  Spouse name: Not on file   Number of children: Not on file   Years of education: 13   Highest education level: Not on file  Occupational History   Occupation: release liens    Employer: [employer]  Tobacco Use   Smoking status: Former    Packs/day: 0.50    Years: 30.00    Total pack years: 15.00    Types: Cigarettes, E-cigarettes    Start date: 04/28/2020    Quit date: 2022    Years since quitting: 1.8   Smokeless tobacco: Never   Tobacco comments:    VAPES  Vaping Use   Vaping Use: Every day   Substances: Nicotine  Substance and Sexual Activity   Alcohol use: Yes    Comment: occasional   Drug use: No   Sexual activity: Not Currently     Partners: Male    Birth control/protection: Surgical    Comment: Tubal/been with same person over 20 years  Other Topics Concern   Not on file  Social History Narrative   Exercise--gym, every other day--- at least 3 days a week   Social Determinants of Health   Financial Resource Strain: Low Risk  (02/26/2022)   Overall Financial Resource Strain (CARDIA)    Difficulty of Paying Living Expenses: Not hard at all  Food Insecurity: No Food Insecurity (03/05/2022)   Hunger Vital Sign    Worried About Running Out of Food in the Last Year: Never true    [place] in the Last Year: Never true  Transportation Needs: No Transportation Needs (03/05/2022)   PRAPARE - Hydrologist (Medical): No    Lack of Transportation (Non-Medical): No  Physical Activity: Not on file  Stress: Not on file  Social Connections: Not on file   Family History  Problem Relation Age of Onset   Hypertension Mother    Schizophrenia Mother    Dementia Mother    Mental illness Mother        schizophrenia, dementia   Hypertension Father    Coronary artery disease Father        Stent   Alzheimer's disease Father    Hypertension Sister    Hypertension Sister    Arthritis Sister        rheumatoid   Dementia Maternal Aunt    Kidney disease Other    Colon cancer Neg Hx    Stomach cancer Neg Hx    Pancreatic cancer Neg Hx    Esophageal cancer Neg Hx    Rectal cancer Neg Hx    Breast cancer Neg Hx    Scheduled Meds:  docusate sodium  100 mg Oral BID   fluconazole  100 mg Oral Daily   pantoprazole  40 mg Oral Daily   Continuous Infusions:  dexamethasone (DECADRON) IVPB ([facility]) 20 mg (03/06/22 2094)   famotidine (PEPCID) IV 40 mg (03/06/22 0931)   PRN Meds:.acetaminophen **OR** acetaminophen, cyclobenzaprine, melatonin, morphine injection, ondansetron **OR** ondansetron (ZOFRAN) IV, oxyCODONE, oxyCODONE, temazepam Medications Prior to Admission:  Prior to Admission  medications   Medication Sig Start Date End Date Taking? Authorizing Provider  amLODipine (NORVASC) 5 MG tablet TAKE 1 TABLET (5 MG TOTAL) BY MOUTH DAILY. 11/07/21  Yes Roma Schanz R, DO  calcium citrate (CALCITRATE - DOSED IN MG ELEMENTAL CALCIUM) 950 (200 Ca) MG tablet Take 200 mg of elemental calcium by mouth daily.   Yes [provider]  cyclobenzaprine (FLEXERIL) 10 MG tablet Take  1 tablet (10 mg total) by mouth at bedtime as needed for muscle spasms. 01/25/22  Yes Williams, Jinny Blossom E, NP  fluticasone (FLONASE) 50 MCG/ACT nasal spray SPRAY 2 SPRAYS INTO EACH NOSTRIL EVERY DAY Patient taking differently: Place 2 sprays into both nostrils daily as needed for allergies. 05/11/21  Yes Roma Schanz R, DO  hydrochlorothiazide (HYDRODIURIL) 25 MG tablet Take 1 tablet (25 mg total) by mouth daily. 12/29/21  Yes [name], DO  Melatonin 10 MG TABS Take 10 mg by mouth at bedtime as needed (sleep).   Yes [provider]  Multiple Vitamins-Minerals (CELEBRATE MULTI-COMPLETE 36 PO) Take 1 each by mouth daily.   Yes [provider]  Multiple Vitamins-Minerals (HAIR SKIN AND NAILS FORMULA PO) Take 1 tablet by mouth daily.   Yes [provider]  nicotine polacrilex (NICORETTE) 4 MG gum Take 4 mg by mouth as needed for smoking cessation.   Yes [provider]  omeprazole (PRILOSEC) 40 MG capsule Take 1 capsule (40 mg total) by mouth daily. Patient taking differently: Take 40 mg by mouth daily as needed (acid reflux). 01/22/22  Yes Martinique, Betty G, MD  oxyCODONE (OXY IR/ROXICODONE) 5 MG immediate release tablet Take 1 tablet (5 mg total) by mouth every 6 (six) hours as needed for severe pain. 03/01/22  Yes Ennever, Rudell Cobb, MD  potassium chloride (KLOR-CON) 10 MEQ tablet TAKE 1 TABLET BY MOUTH EVERY DAY Patient taking differently: Take 10 mEq by mouth daily. 10/11/21  Yes Roma Schanz R, DO  temazepam (RESTORIL) 15 MG capsule Take 1 capsule (15  mg total) by mouth at bedtime as needed for sleep. 02/28/22  Yes Ennever, Rudell Cobb, MD  nicotine (NICODERM CQ - DOSED IN MG/24 HOURS) 14 mg/24hr patch Place 1 patch (14 mg total) onto the skin daily. Patient not taking: Reported on 02/19/2022 12/08/21   Carollee Herter, Alferd Apa, DO  nicotine (NICODERM CQ) 7 mg/24hr patch Place 1 patch (7 mg total) onto the skin daily. Patient not taking: Reported on 02/19/2022 12/08/21   [name], DO   No Known Allergies Review of Systems Complains of very minimal pain in both legs Physical Exam Awake alert oriented Resting in bed Lungs clear to auscultation Heart with regular rate and rhythm Abdomen is not distended Does not have lower extremity edema Extremities spontaneously No focal deficits  Vital Signs: BP 118/65 (BP Location: Right Arm)   Pulse (!) 106   Temp 98.4 F (36.9 C) (Oral)   Resp 18   Ht '5\' 5"'$  (1.651 m)   Wt 89.8 kg   LMP 07/27/2020 (Within Days)   SpO2 95%   BMI 32.95 kg/m  Pain Scale: 0-10   Pain Score: 3    SpO2: SpO2: 95 % O2 Device:SpO2: 95 % O2 Flow Rate: .   IO: Intake/output summary:  Intake/Output Summary (Last 24 hours) at 03/06/2022 1357 Last data filed at 03/06/2022 0830 Gross per 24 hour  Intake 1260.06 ml  Output --  Net 1260.06 ml    LBM: Last BM Date : 03/05/22 Baseline Weight: Weight: 89.8 kg Most recent weight: Weight: 89.8 kg     Palliative Assessment/Data:   PPS 70%  Time In: 1320 Time Out: 1420 Time Total: 60  Greater than 50%  of this time was spent counseling and coordinating care related to the above assessment and plan.  Signed by: Loistine Chance, MD   Please contact Palliative Medicine Team phone at [phone] for questions and  concerns.  For individual provider: See Shea Evans

## 2022-03-07 ENCOUNTER — Ambulatory Visit (HOSPITAL_COMMUNITY): Payer: BC Managed Care – PPO

## 2022-03-07 ENCOUNTER — Other Ambulatory Visit (HOSPITAL_COMMUNITY): Payer: BC Managed Care – PPO

## 2022-03-07 ENCOUNTER — Encounter: Payer: Self-pay | Admitting: *Deleted

## 2022-03-07 ENCOUNTER — Inpatient Hospital Stay (HOSPITAL_COMMUNITY): Payer: BC Managed Care – PPO

## 2022-03-07 ENCOUNTER — Ambulatory Visit
Admit: 2022-03-07 | Discharge: 2022-03-07 | Disposition: A | Discharge status: Home / Self Care | Payer: BC Managed Care – PPO | Attending: Radiation Oncology | Admitting: Radiation Oncology

## 2022-03-07 ENCOUNTER — Other Ambulatory Visit: Payer: Self-pay

## 2022-03-07 DIAGNOSIS — E785 Hyperlipidemia, unspecified: Secondary | ICD-10-CM

## 2022-03-07 DIAGNOSIS — I10 Essential (primary) hypertension: Secondary | ICD-10-CM

## 2022-03-07 DIAGNOSIS — C7951 Secondary malignant neoplasm of bone: Secondary | ICD-10-CM | POA: Diagnosis not present

## 2022-03-07 HISTORY — PX: IR IMAGING GUIDED PORT INSERTION: IMG5740

## 2022-03-07 LAB — RAD ONC ARIA SESSION SUMMARY
Course Elapsed Days: 1
Plan Fractions Treated to Date: 2
Plan Fractions Treated to Date: 2
Plan Fractions Treated to Date: 2
Plan Fractions Treated to Date: 2
Plan Prescribed Dose Per Fraction: 3 Gy
Plan Prescribed Dose Per Fraction: 3 Gy
Plan Prescribed Dose Per Fraction: 3 Gy
Plan Prescribed Dose Per Fraction: 3 Gy
Plan Total Fractions Prescribed: 10
Plan Total Fractions Prescribed: 10
Plan Total Fractions Prescribed: 10
Plan Total Fractions Prescribed: 10
Plan Total Prescribed Dose: 30 Gy
Plan Total Prescribed Dose: 30 Gy
Plan Total Prescribed Dose: 30 Gy
Plan Total Prescribed Dose: 30 Gy
Reference Point Dosage Given to Date: 6 Gy
Reference Point Dosage Given to Date: 6 Gy
Reference Point Dosage Given to Date: 6 Gy
Reference Point Dosage Given to Date: 6 Gy
Reference Point Session Dosage Given: 3 Gy
Reference Point Session Dosage Given: 3 Gy
Reference Point Session Dosage Given: 3 Gy
Reference Point Session Dosage Given: 3 Gy
Session Number: 2

## 2022-03-07 MED ORDER — HEPARIN SOD (PORK) LOCK FLUSH 100 UNIT/ML IV SOLN
INTRAVENOUS | Status: AC
  Administered 2022-03-07: 500 [IU]
  Filled 2022-03-07: qty 5

## 2022-03-07 MED ORDER — AMLODIPINE BESYLATE 5 MG PO TABS
5.0000 mg | ORAL_TABLET | Freq: Every day | ORAL | Status: DC
  Administered 2022-03-07 – 2022-03-08 (×2): 5 mg via ORAL
  Filled 2022-03-07 (×2): qty 1

## 2022-03-07 MED ORDER — HYDROCHLOROTHIAZIDE 25 MG PO TABS
25.0000 mg | ORAL_TABLET | Freq: Every day | ORAL | Status: DC
  Administered 2022-03-07 – 2022-03-08 (×2): 25 mg via ORAL
  Filled 2022-03-07 (×2): qty 1

## 2022-03-07 MED ORDER — FENTANYL CITRATE (PF) 100 MCG/2ML IJ SOLN
INTRAMUSCULAR | Status: AC
  Filled 2022-03-07: qty 2

## 2022-03-07 MED ORDER — MIDAZOLAM HCL 2 MG/2ML IJ SOLN
INTRAMUSCULAR | Status: AC
  Filled 2022-03-07: qty 4

## 2022-03-07 MED ORDER — DEXAMETHASONE 4 MG PO TABS
12.0000 mg | ORAL_TABLET | Freq: Three times a day (TID) | ORAL | Status: DC
  Administered 2022-03-07 – 2022-03-08 (×4): 12 mg via ORAL
  Filled 2022-03-07 (×4): qty 3

## 2022-03-07 MED ORDER — HEPARIN SOD (PORK) LOCK FLUSH 100 UNIT/ML IV SOLN
INTRAVENOUS | Status: AC | PRN
  Administered 2022-03-07: 500 [IU] via INTRAVENOUS

## 2022-03-07 MED ORDER — LIDOCAINE-EPINEPHRINE 1 %-1:100000 IJ SOLN
INTRAMUSCULAR | Status: AC | PRN
  Administered 2022-03-07: 20 mL

## 2022-03-07 MED ORDER — MIDAZOLAM HCL 2 MG/2ML IJ SOLN
INTRAMUSCULAR | Status: AC | PRN
  Administered 2022-03-07 (×4): 1 mg via INTRAVENOUS

## 2022-03-07 MED ORDER — LIDOCAINE-EPINEPHRINE 1 %-1:100000 IJ SOLN
INTRAMUSCULAR | Status: AC
  Filled 2022-03-07: qty 1

## 2022-03-07 MED ORDER — FENTANYL CITRATE (PF) 100 MCG/2ML IJ SOLN
INTRAMUSCULAR | Status: AC | PRN
  Administered 2022-03-07 (×2): 50 ug via INTRAVENOUS

## 2022-03-07 NOTE — Progress Notes (Signed)
PROGRESS NOTE   Makayla Good  VHQ:[id number] DOB: 12-04-1970 DOA: 03/05/2022 PCP: [name], DO   Date of Service: the patient was seen and examined on 03/07/2022  Brief Narrative:  51 y.o. 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  Patient presented to [facility] emergency department initially with presenting with thigh pain.   Upon initial work-up patient was determined to have evidence of metastatic disease throughout the spine with concurrent spinal stenosis, spinal cord compression and pathologic fractures at T4 and L3.hospitalist group was called and patient was admitted to the hospital.   Dr. [name] with oncology who follows the patient as an outpatient was consulted in addition to radiation oncology.  Patient has since been initiated on Decadron and radiation therapy.  Patient is status post Port-A-Cath placement earlier in the day on 11/15.   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 [name] with medical oncology and Dr. Tammi Klippel. Patient is status post Port-A-Cath placement today Patient is currently receiving Decadron which oncology has recommended be transition to oral therapy today. Patient has received Zometa due to the extensive bony metastases she is suffering from Patient reports slow clinical improvement We will place order for PT to evaluate in preparation for eventual discharge Oncology advises possible discharge by the weekend if patient continues to clinically improve  2.)  Multilevel spinal cord compression  Please see assessment and plan above  3.)  Pathologic fractures of the spine at T4 and L3  Pain seems to be improving with radiation therapy Otherwise, conservative  management  4.)  Essential hypertension  Norvasc 5 mg daily As needed intravenous antihypertensives for markedly elevated blood pressures  5.)  Gastroesophageal reflux disease  Daily proton pump inhibitor  Subjective:  Patient complaining of left shoulder and low back pain, moderate in intensity, sore in quality, worse with movement and improved with rest.  Patient reports her pain is improved compared to admission.  Physical Exam:  Vitals:   03/07/22 1440 03/07/22 1445 03/07/22 1520 03/07/22 2125  BP: 129/71 128/67 134/70 115/74  Pulse: (!) 116 (!) 111 (!) 106 85  Resp: (!) 28 (!) 23 (!) 22 18  Temp:   98.3 F (36.8 C) 98.4 F (36.9 C)  TempSrc:   Oral Oral  SpO2: 96% 98% 97% 97%  Weight:      Height:        Constitutional: Awake alert and oriented x3, no associated distress.   Skin: no rashes, no lesions, good skin turgor noted. Eyes: Pupils are equally reactive to light.  No evidence of scleral icterus or conjunctival pallor.  ENMT: Moist mucous membranes noted.  Posterior pharynx clear of any exudate or lesions.   Respiratory: clear to auscultation bilaterally, no wheezing, no crackles. Normal respiratory effort. No accessory muscle use.  Cardiovascular: Regular rate and rhythm, no murmurs / rubs / gallops. No extremity edema. 2+ pedal pulses. No carotid bruits.  Abdomen: Abdomen is soft and nontender.  No evidence of intra-abdominal masses.  Positive bowel sounds noted in all quadrants.  Back: Patient reports tenderness of the left shoulder and midline lumbar spine Musculoskeletal: No joint deformity upper and lower extremities. Good ROM, no contractures. Normal muscle tone.    Data Reviewed:  I have personally reviewed and interpreted  labs, imaging.  Significant findings are   CBC: Recent Labs  Lab 03/05/22 0550 03/06/22 0523  WBC 7.2 11.6*  NEUTROABS 4.1  --   HGB 12.2 11.9*  HCT 36.7 35.6*  MCV 92.9 93.7  PLT 322 485   Basic Metabolic Panel: Recent  Labs  Lab 03/05/22 0550 03/06/22 0523  NA 136 137  K 3.5 3.7  CL 99 103  CO2 26 24  GLUCOSE 124* 145*  BUN 12 16  CREATININE 0.54 0.58  CALCIUM 10.0 10.2   GFR: Estimated Creatinine Clearance: 92.1 mL/min (by C-G formula based on SCr of 0.58 mg/dL). Liver Function Tests: Recent Labs  Lab 03/05/22 0550 03/06/22 0523  AST 30 24  ALT 29 25  ALKPHOS 125 117  BILITOT 0.9 0.6  PROT 8.7* 7.9  ALBUMIN 4.0 3.8      Code Status:  Full code.  Code status decision has been confirmed with: patient Family Communication: deferred    Severity of Illness:  The appropriate patient status for this patient is INPATIENT. Inpatient status is judged to be reasonable and necessary in order to provide the required intensity of service to ensure the patient's safety. The patient's presenting symptoms, physical exam findings, and initial radiographic and laboratory data in the context of their chronic comorbidities is felt to place them at high risk for further clinical deterioration. Furthermore, it is not anticipated that the patient will be medically stable for discharge from the hospital within 2 midnights of admission.   * I certify that at the point of admission it is my clinical judgment that the patient will require inpatient hospital care spanning beyond 2 midnights from the point of admission due to high intensity of service, high risk for further deterioration and high frequency of surveillance required.*  Time spent:  49 minutes  Author:  Vernelle Emerald MD  03/07/2022 10:29 PM

## 2022-03-07 NOTE — Progress Notes (Signed)
Daily Progress Note   Patient Name: Makayla Good       Date: 03/07/2022 DOB: 1970/12/08  Age: 51 y.o. MRN#: [id number] Attending Physician: Vernelle Emerald, MD Primary Care Physician: Carollee Herter, Alferd Apa, DO Admit Date: 03/05/2022  Reason for Consultation/Follow-up: Establishing goals of care  Subjective: Awake alert oriented sister and niece at bedside, denies leg pain or weakness, tolerating radiation, appreciate chaplain assistance, patient met with chaplain yesterday for completion of advance care planning documents.  Length of Stay: 1  Current Medications: Scheduled Meds:   amLODipine  5 mg Oral Daily   dexamethasone  12 mg Oral Q8H   docusate sodium  100 mg Oral BID   famotidine  40 mg Oral Daily   fluconazole  100 mg Oral Daily   hydrochlorothiazide  25 mg Oral Daily   pantoprazole  40 mg Oral Daily    Continuous Infusions:   PRN Meds: acetaminophen **OR** acetaminophen, cyclobenzaprine, melatonin, morphine injection, ondansetron **OR** ondansetron (ZOFRAN) IV, oxyCODONE, oxyCODONE, temazepam  Physical Exam         Awake alert oriented Regular work of breathing S1-S2 Abdomen is not distended  Vital Signs: BP (!) 130/92 (BP Location: Right Arm)   Pulse 85   Temp 98.7 F (37.1 C) (Oral)   Resp 14   Ht _0  (1.651 m)   Wt 89.8 kg   LMP 07/27/2020 (Within Days)   SpO2 100%   BMI 32.95 kg/m  SpO2: SpO2: 100 % O2 Device: O2 Device: Room Air O2 Flow Rate:    Intake/output summary:  Intake/Output Summary (Last 24 hours) at 03/07/2022 1127 Last data filed at 03/07/2022 0900 Gross per 24 hour  Intake 292 ml  Output --  Net 292 ml   LBM: Last BM Date : 03/05/22 Baseline Weight: Weight: 89.8 kg Most recent weight: Weight: 89.8 kg        Palliative Assessment/Data:      Patient Active Problem List   Diagnosis Date Noted   Pancreatic cancer ([place]) 03/06/2022   Metastatic cancer to spine ([place]) 03/05/2022   Leg pain, bilateral 03/05/2022   Insomnia 03/05/2022   Pancreatic adenocarcinoma ([place]) 03/01/2022   Varicose veins of right lower extremity with pain 12/08/2021   Multinodular goiter 10/25/2020   Anxiety 10/14/2020   Increased frequency of headaches 09/05/2020  Abnormal CT of the head 09/05/2020   Loud snoring 09/05/2020   Gastroesophageal reflux disease 05/05/2020   Hyperglycemia 05/05/2020   Lower extremity edema 09/22/2019   Lymphedema 09/22/2019   HTN (hypertension) 08/05/2017   Hypokalemia 08/05/2017   OSA (obstructive sleep apnea) 03/05/2017   Asthma 03/05/2017   Dizziness 02/08/2017   Preventative health care 02/08/2017   Abdominal pain 07/05/2016   RUQ pain 07/05/2016   Irregular periods 07/26/2014   Dysuria 02/04/2014   Severe obesity (BMI >= 40) ([facility]) 10/21/2012   OA (osteoarthritis) of knee 06/04/2012   Cough 03/05/2012   Plantar fasciitis 09/04/2010   HYPOKALEMIA 07/01/2009   CHEST PAIN UNSPECIFIED 05/18/2009   DEPRESSION 03/07/2009   FATIGUE 03/07/2009   TOBACCO USER 12/03/2008   Morbid obesity ([place]) 05/21/2008   HLD (hyperlipidemia) 16/01/9603   DYSMETABOLIC SYNDROME X 54/12/8117   CONJUNCTIVITIS NOS 02/04/2007   VAGINITIS NOS 11/07/2006   FUNGAL DERMATITIS 10/09/2006   Benign essential HTN 10/09/2006    Palliative Care Assessment & Plan   Patient Profile:    Assessment: Metastatic pancreatic cancer  extensive bony metastases  Bilateral lower extremity discomfort Recommendations/Plan: General undergo Port-A-Cath placement today, medical oncology following, patient with pancreatic adenocarcinoma mets to liver and spine, recent imaging showing spinal stenosis and spinal cord compression of various degrees, on steroids and also on radiation. Appreciate spiritual care  consultation and their support, patient completing advance care planning documents.  Goals of Care and Additional Recommendations: Limitations on Scope of Treatment: Full Scope Treatment  Code Status:    Code Status Orders  (From admission, onward)           Start     Ordered   03/05/22 1606  Full code  Continuous        03/05/22 1605           Code Status History     Date Active Date Inactive Code Status Order ID Comments User Context   07/29/2017 1102 07/30/2017 1745 Full Code [id number]  [name], MD Inpatient       Prognosis:  Unable to determine  Discharge Planning: Home with Home Health Recommend consideration of palliative care at [facility] with my colleague Ms. Cousar, NP for additional support given the serious nature of the patient's condition.  Care plan was discussed with patient and family present at bedside.  Thank you for allowing the Palliative Medicine Team to assist in the care of this patient. Mod MDM.      Greater than 50%  of this time was spent counseling and coordinating care related to the above assessment and plan.  Loistine Chance, MD  Please contact Palliative Medicine Team phone at [phone] for questions and concerns.

## 2022-03-07 NOTE — Progress Notes (Signed)
Patient was scheduled to get port today. Upon chart review found that patient was admitted to the hospital for impending cord compression due to spinal mets. She has begun radiation. She will get port placed while inpatient.   Will continue to follow for post discharge needs and office follow up. She is due for chemo education and treatment initiation in the next 2 weeks. Will follow for appropriateness of that plan.   Oncology Nurse Navigator Documentation     03/07/2022   11:00 AM  Oncology Nurse Navigator Flowsheets  Planned Course of Treatment Radiation  Phase of Treatment Radiation  Radiation Actual Start Date: 03/06/2022  Radiation Expected End Date: 03/20/2022  Navigator Follow Up Date: 03/12/2022  Navigator Follow Up Reason: Appointment Review  Navigator Location [facility]  Navigator Encounter Type Appt/Treatment Plan Review  Patient Visit Type MedOnc  Treatment Phase Active Tx  Barriers/Navigation Needs Coordination of Care;Education  Interventions None Required  Acuity Level 2-Minimal Needs (1-2 Barriers Identified)  Support Groups/Services Friends and Family  Time Spent with Patient 15

## 2022-03-07 NOTE — Hospital Course (Signed)
51 y.o. 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  Patient presented to [facility] emergency department initially with presenting with thigh pain.   Upon initial work-up patient was determined to have evidence of metastatic disease throughout the spine with concurrent spinal stenosis, spinal cord compression and pathologic fractures at T4 and L3.hospitalist group was called and patient was admitted to the hospital.   Dr. [name] with oncology who follows the patient as an outpatient was consulted in addition to radiation oncology.  Patient has since been initiated on Decadron and radiation therapy.  Patient is status post Port-A-Cath placement earlier in the day on 11/15.

## 2022-03-07 NOTE — Procedures (Signed)
Interventional Radiology Procedure Note ° °Procedure: Single Lumen Power Port Placement   ° °Access:  Right internal jugular vein ° °Findings: Catheter tip positioned at cavoatrial junction. Port is ready for immediate use.  ° °Complications: None ° °EBL: < 10 mL ° °Recommendations:  °- Ok to shower in 24 hours °- Do not submerge for 7 days °- Routine line care  ° ° °Claudeen Leason, MD ° ° ° °

## 2022-03-07 NOTE — Progress Notes (Signed)
Chaplain received a referral to assist J[place] with advance directives.  Chaplain met with Makayla Good and provided emotional support as she shared about her diagnosis. She is accepting that no one lives forever, and she is not afraid of dying, but she is worried about her sons and grieving what she will miss in their lives. She has been strong for her family and appreciated the opportunity to share with someone and not have to be strong for them. She has two sons, one is 42 and the other is 31.  She very much wants to be able to see them both graduate (the older from college and the younger from high school).  She has several siblings and she is married, but her husband has some significant health issues of his own. She wants to assign her older son as 65 and her niece (whom she helped raise) as the alternate.  Chaplain reviewed the forms with her and will return tomorrow to assist with getting them notarized.  [address], [place], [phone]

## 2022-03-07 NOTE — Progress Notes (Signed)
Makayla Good seems to be doing pretty well.  She is having no problems with her legs.  She is walking.  She is out of bed.  She started radiation therapy to her back yesterday.  She also got her Zometa.  She is complaining of some pain in the left shoulder blade area.  She does say that the pain medicine does help her.  She will go for her Port-A-Cath today.  I do appreciate Interventional Radiology help [place] out with that while she is inpatient.  I would like to think that if she continues to do this well, should be able to have radiation as an outpatient.  Her appetite is doing okay.  She has had no nausea or vomiting.  She has had no problems with bowels or bladder.  She has had no bleeding.  She has had no rashes.  Her vital signs show temperature of 98.7.  Pulse 85.  Blood pressure 130/92.  Her head and neck exam shows no ocular or oral lesions.  She has no palpable cervical or supraclavicular lymph nodes.  Lungs are clear bilaterally.  Cardiac exam regular rate and rhythm.  Abdomen is soft.  Bowel sounds are present.  Back exam does show a little bit of tenderness over the left shoulder blade.  Extremity shows good range of motion.  She has good strength in her lower extremities.  She has good sensation.  Makayla Good has a metastatic pancreatic cancer.  She has extensive bony metastasis which is unusual for pancreatic cancer.  She started radiation therapy.  She did get Zometa.  I will switch her over to oral Decadron now.  She really needs to make sure she stays on the Diflucan and her Protonix.  She needs to get back on her blood pressure medications.  The steroids will certainly increase her blood pressure.  Again, hopefully, if all goes well, she will be able to be discharged before the weekend.  That would really be nice for her.  I know she is getting incredible care from everybody up on [address].  Lattie Haw, MD  Psalm 147:3

## 2022-03-08 ENCOUNTER — Other Ambulatory Visit: Payer: Self-pay

## 2022-03-08 ENCOUNTER — Ambulatory Visit
Admit: 2022-03-08 | Discharge: 2022-03-08 | Disposition: A | Discharge status: Home / Self Care | Payer: BC Managed Care – PPO | Attending: Radiation Oncology | Admitting: Radiation Oncology

## 2022-03-08 ENCOUNTER — Other Ambulatory Visit: Payer: Self-pay | Admitting: Physical Medicine and Rehabilitation

## 2022-03-08 DIAGNOSIS — C7951 Secondary malignant neoplasm of bone: Secondary | ICD-10-CM | POA: Diagnosis not present

## 2022-03-08 DIAGNOSIS — M79605 Pain in left leg: Secondary | ICD-10-CM

## 2022-03-08 DIAGNOSIS — M79604 Pain in right leg: Secondary | ICD-10-CM

## 2022-03-08 LAB — CBC WITH DIFFERENTIAL/PLATELET
Abs Immature Granulocytes: 0.13 10*3/uL — ABNORMAL HIGH (ref 0.00–0.07)
Basophils Absolute: 0 10*3/uL (ref 0.0–0.1)
Basophils Relative: 0 %
Eosinophils Absolute: 0 10*3/uL (ref 0.0–0.5)
Eosinophils Relative: 0 %
HCT: 35.1 % — ABNORMAL LOW (ref 36.0–46.0)
Hemoglobin: 11.4 g/dL — ABNORMAL LOW (ref 12.0–15.0)
Immature Granulocytes: 2 %
Lymphocytes Relative: 6 %
Lymphs Abs: 0.4 10*3/uL — ABNORMAL LOW (ref 0.7–4.0)
MCH: 31.1 pg (ref 26.0–34.0)
MCHC: 32.5 g/dL (ref 30.0–36.0)
MCV: 95.6 fL (ref 80.0–100.0)
Monocytes Absolute: 0.3 10*3/uL (ref 0.1–1.0)
Monocytes Relative: 4 %
Neutro Abs: 6.5 10*3/uL (ref 1.7–7.7)
Neutrophils Relative %: 88 %
Platelets: 301 10*3/uL (ref 150–400)
RBC: 3.67 MIL/uL — ABNORMAL LOW (ref 3.87–5.11)
RDW: 13 % (ref 11.5–15.5)
WBC: 7.3 10*3/uL (ref 4.0–10.5)
nRBC: 0 % (ref 0.0–0.2)

## 2022-03-08 LAB — COMPREHENSIVE METABOLIC PANEL
ALT: 31 U/L (ref 0–44)
AST: 37 U/L (ref 15–41)
Albumin: 3.5 g/dL (ref 3.5–5.0)
Alkaline Phosphatase: 134 U/L — ABNORMAL HIGH (ref 38–126)
Anion gap: 8 (ref 5–15)
BUN: 15 mg/dL (ref 6–20)
CO2: 26 mmol/L (ref 22–32)
Calcium: 8.8 mg/dL — ABNORMAL LOW (ref 8.9–10.3)
Chloride: 103 mmol/L (ref 98–111)
Creatinine, Ser: 0.57 mg/dL (ref 0.44–1.00)
GFR, Estimated: >60 mL/min (ref >60)
Glucose, Bld: 155 mg/dL — ABNORMAL HIGH (ref 70–99)
Potassium: 3.9 mmol/L (ref 3.5–5.1)
Sodium: 137 mmol/L (ref 135–145)
Total Bilirubin: 0.5 mg/dL (ref 0.3–1.2)
Total Protein: 7.6 g/dL (ref 6.5–8.1)

## 2022-03-08 LAB — RAD ONC ARIA SESSION SUMMARY
Course Elapsed Days: 2
Plan Fractions Treated to Date: 3
Plan Fractions Treated to Date: 3
Plan Fractions Treated to Date: 3
Plan Fractions Treated to Date: 3
Plan Prescribed Dose Per Fraction: 3 Gy
Plan Prescribed Dose Per Fraction: 3 Gy
Plan Prescribed Dose Per Fraction: 3 Gy
Plan Prescribed Dose Per Fraction: 3 Gy
Plan Total Fractions Prescribed: 10
Plan Total Fractions Prescribed: 10
Plan Total Fractions Prescribed: 10
Plan Total Fractions Prescribed: 10
Plan Total Prescribed Dose: 30 Gy
Plan Total Prescribed Dose: 30 Gy
Plan Total Prescribed Dose: 30 Gy
Plan Total Prescribed Dose: 30 Gy
Reference Point Dosage Given to Date: 9 Gy
Reference Point Dosage Given to Date: 9 Gy
Reference Point Dosage Given to Date: 9 Gy
Reference Point Dosage Given to Date: 9 Gy
Reference Point Session Dosage Given: 3 Gy
Reference Point Session Dosage Given: 3 Gy
Reference Point Session Dosage Given: 3 Gy
Reference Point Session Dosage Given: 3 Gy
Session Number: 3

## 2022-03-08 LAB — MAGNESIUM: Magnesium: 2.5 mg/dL — ABNORMAL HIGH (ref 1.7–2.4)

## 2022-03-08 MED ORDER — LACTULOSE 10 GM/15ML PO SOLN
20.0000 g | Freq: Two times a day (BID) | ORAL | Status: DC
  Administered 2022-03-08: 20 g via ORAL
  Filled 2022-03-08: qty 30

## 2022-03-08 MED ORDER — DEXAMETHASONE 4 MG PO TABS: 8.0000 mg | ORAL_TABLET | Freq: Three times a day (TID) | ORAL | 0 refills | Status: DC

## 2022-03-08 MED ORDER — DEXAMETHASONE 4 MG PO TABS: 8.0000 mg | ORAL_TABLET | Freq: Three times a day (TID) | ORAL | Status: DC

## 2022-03-08 MED ORDER — OXYCODONE HCL 5 MG PO TABS: 5.0000 mg | ORAL_TABLET | Freq: Four times a day (QID) | ORAL | 0 refills | Status: DC | PRN

## 2022-03-08 MED ORDER — FLUCONAZOLE 100 MG PO TABS: 100.0000 mg | ORAL_TABLET | Freq: Every day | ORAL | 0 refills | Status: DC

## 2022-03-08 MED ORDER — ADULT MULTIVITAMIN W/MINERALS CH: 1.0000 | ORAL_TABLET | Freq: Every day | ORAL | Status: DC

## 2022-03-08 NOTE — Progress Notes (Signed)
PMT no charge note Patient in radiation at the time of my visit discussed with family member at bedside Medication history reviewed, chart reviewed, recommend continuation of oxycodone immediate release as well as dexamethasone upon discharge, recommend patient palliative care follow-up, spiritual care consulted for completion of advance care planning documents during this hospitalization, status post Port-A-Cath placement.  No additional PMT recommendations at this time.  Loistine Chance MD [facility].

## 2022-03-08 NOTE — Progress Notes (Signed)
Discharge instructions, RX's and follow up appts explained and provided to patient and family verbalized understanding. Patient left floor via wheelchair accompanied by staff no c/o pain or shortness of breath at d/c.  Eriberto Felch, Tivis Ringer, RN

## 2022-03-08 NOTE — Plan of Care (Signed)
  Problem: Education: Goal: Knowledge of the prescribed therapeutic regimen will improve Outcome: Adequate for Discharge   Problem: Activity: Goal: Ability to implement measures to reduce episodes of fatigue will improve Outcome: Adequate for Discharge   Problem: Bowel/Gastric: Goal: Will not experience complications related to bowel motility Outcome: Adequate for Discharge   Problem: Coping: Goal: Ability to identify and develop effective coping behavior will improve Outcome: Adequate for Discharge   Problem: Nutritional: Goal: Maintenance of adequate nutrition will improve Outcome: Adequate for Discharge   Problem: Health Behavior/Discharge Planning: Goal: Ability to manage health-related needs will improve Outcome: Adequate for Discharge   Problem: Clinical Measurements: Goal: Ability to maintain clinical measurements within normal limits will improve Outcome: Adequate for Discharge Goal: Will remain free from infection Outcome: Adequate for Discharge Goal: Diagnostic test results will improve Outcome: Adequate for Discharge Goal: Respiratory complications will improve Outcome: Adequate for Discharge Goal: Cardiovascular complication will be avoided Outcome: Adequate for Discharge   Problem: Activity: Goal: Risk for activity intolerance will decrease Outcome: Adequate for Discharge   Problem: Nutrition: Goal: Adequate nutrition will be maintained Outcome: Adequate for Discharge   Problem: Coping: Goal: Level of anxiety will decrease Outcome: Adequate for Discharge   Problem: Elimination: Goal: Will not experience complications related to bowel motility Outcome: Adequate for Discharge Goal: Will not experience complications related to urinary retention Outcome: Adequate for Discharge   Problem: Pain Managment: Goal: General experience of comfort will improve Outcome: Adequate for Discharge   Problem: Safety: Goal: Ability to remain free from injury will  improve Outcome: Adequate for Discharge   Problem: Skin Integrity: Goal: Risk for impaired skin integrity will decrease Outcome: Adequate for Discharge   Problem: Increased Nutrient Needs (NI-5.1) Goal: Food and/or nutrient delivery Description: Individualized approach for food/nutrient provision. Outcome: Adequate for Discharge

## 2022-03-08 NOTE — Progress Notes (Signed)
Makayla Good had the Port-A-Cath put in yesterday.  As always, Interventional Radiology does a fantastic job.  She is doing well with radiation.  She is walking.  She has little bit of pain in the back when she stands up for a little bit.  There is no weakness in the legs.  She has had no problems with bowels or bladder.  She is constipated.  We will give her some lactulose to see if this does not help with the constipation.  She had lab work done today.  I do not have the results back yet.  She has had no nausea or vomiting.  She has had no cough or shortness of breath.  She has had no bleeding.  She has had no fever.  Hopefully, she will be able to go home today, or tomorrow at the latest.  She feels comfortable being able to go home.  Her pain is under control.  She is ambulating.  Vital signs show temperature of 98.2.  Pulse 86.  Blood pressure 136/83.  Her head and neck exam shows no ocular or oral lesions.  There are no palpable lymph nodes.  Lungs are clear bilaterally.  Cardiac exam regular rate and rhythm with no murmurs.  Abdomen is soft.  She has good bowel sounds.  There is no fluid wave.  Extremity shows no clubbing, cyanosis or edema.  She has good strength in her legs.  She has good range of motion of her joints.  Neurological exam is nonfocal.  Makayla Good has metastatic pancreatic cancer.  Surprising, she has bony metastasis.  This is unusual for pancreatic cancer.  She is getting radiation therapy to the spine.  She is on oral Decadron.  She is tolerating this well.  Hopefully, lactulose will make her go to the bathroom.  I will move back her chemotherapy to the first Monday in December.  I want to make sure that she completes her radiation therapy to the spine.  I want her to have a week or so off before she starts any kind of chemotherapy.  Hopefully, she will be able to go home in 1-2 days.  Appreciate the great care she is getting from everybody upon 6 E.  Lattie Haw,  MD  Colossians 3:15

## 2022-03-08 NOTE — Discharge Summary (Addendum)
Physician Discharge Summary   Patient: Makayla Good MRN: [id number] DOB: [birth year]  Admit date:     03/05/2022  Discharge date: 03/08/22  Discharge Physician: Vernelle Emerald   PCP: [name], DO   Recommendations at discharge:   Patient instructed to continue to attend all outpatient radiation oncology treatments led by Dr. Tammi Klippel Patient advised to continue to follow-up as an outpatient with her medical oncologist Dr. [name] Patient advised to follow-up with her primary care provider Patient advised to take her new prescriptions for fluconazole and Decadron as prescribed Patient advised to return to the emergency department she develops any worsening pain, fevers or weakness.  Discharge Diagnoses: Principal Problem:   Metastatic cancer to spine [facility]) Active Problems:   HLD (hyperlipidemia)   HTN (hypertension)   Leg pain, bilateral   Insomnia   Pancreatic cancer ([place])  Resolved Problems:   * No resolved hospital problems. *   Hospital Course: 51 y.o. 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  Patient presented to [facility] emergency department initially with presenting with thigh pain.   Upon initial work-up patient was determined to have evidence of metastatic disease throughout the spine with concurrent spinal stenosis, spinal cord compression and pathologic fractures at T4 and L3.  The hospitalist group was called and patient was admitted to the hospital.   Dr. [name] with oncology who follows the patient as an outpatient was consulted in addition to radiation oncology.  Patient was then initiated on Decadron and radiation therapy.  Patient then underwent Port-A-Cath placement  on 11/15.  The days that followed patient clinically improve with improving strength and pain.  Arrangements were made for patient to be discharged home in improved and  stable condition on 11/16 with continued outpatient radiation therapy, outpatient follow-up with Dr. [name] with medical oncology as well as follow-up with her primary care provider.     Consultants: Dr. [name] with Oncology Procedures performed: [facility] A Cath Disposition: Home Diet recommendation:  Discharge Diet Orders (From admission, onward)     Start     Ordered   03/08/22 0000  Diet - low sodium heart healthy        03/08/22 1052           Cardiac diet  DISCHARGE MEDICATION: Allergies as of 03/08/2022   No Known Allergies      Medication List     TAKE these medications    amLODipine 5 MG tablet Commonly known as: NORVASC TAKE 1 TABLET (5 MG TOTAL) BY MOUTH DAILY.   calcium citrate 950 (200 Ca) MG tablet Commonly known as: CALCITRATE - dosed in mg elemental calcium Take 200 mg of elemental calcium by mouth daily.   CELEBRATE MULTI-COMPLETE 36 PO Take 1 each by mouth daily.   HAIR SKIN AND NAILS FORMULA PO Take 1 tablet by mouth daily.   cyclobenzaprine 10 MG tablet Commonly known as: FLEXERIL Take 1 tablet (10 mg total) by mouth at bedtime as needed for muscle spasms.   dexamethasone 4 MG tablet Commonly known as: DECADRON Take 2 tablets (8 mg total) by mouth every 8 (eight) hours.   fluconazole 100 MG tablet Commonly known as: DIFLUCAN Take 1 tablet (100 mg total) by mouth daily. Start taking on: March 09, 2022   fluticasone 50 MCG/ACT nasal spray Commonly known as: FLONASE SPRAY 2 SPRAYS INTO EACH NOSTRIL EVERY DAY What changed: See the new instructions.  hydrochlorothiazide 25 MG tablet Commonly known as: HYDRODIURIL Take 1 tablet (25 mg total) by mouth daily.   Melatonin 10 MG Tabs Take 10 mg by mouth at bedtime as needed (sleep).   nicotine 14 mg/24hr patch Commonly known as: NICODERM CQ - dosed in mg/24 hours Place 1 patch (14 mg total) onto the skin daily.   nicotine 7 mg/24hr patch Commonly known as: Nicoderm CQ Place 1  patch (7 mg total) onto the skin daily.   nicotine polacrilex 4 MG gum Commonly known as: NICORETTE Take 4 mg by mouth as needed for smoking cessation.   omeprazole 40 MG capsule Commonly known as: PRILOSEC Take 1 capsule (40 mg total) by mouth daily. What changed:  when to take this reasons to take this   oxyCODONE 5 MG immediate release tablet Commonly known as: Oxy IR/ROXICODONE Take 1 tablet (5 mg total) by mouth every 6 (six) hours as needed for up to 12 doses for severe pain or moderate pain. What changed: reasons to take this   potassium chloride 10 MEQ tablet Commonly known as: KLOR-CON TAKE 1 TABLET BY MOUTH EVERY DAY   temazepam 15 MG capsule Commonly known as: RESTORIL Take 1 capsule (15 mg total) by mouth at bedtime as needed for sleep.        Follow-up Information     [name], DO. Schedule an appointment as soon as possible for a visit in 1 week(s).   Specialty: Family Medicine Contact information: [address] [place] [address] [phone]         Volanda Napoleon, MD Follow up.   Specialty: Oncology Contact information: [address] [place] [address] [phone]         Tyler Pita, MD Follow up.   Specialty: Radiation Oncology Why: Attend all previously scheduled radiation oncology appoitments Contact information: [place] [address] [phone]                 Discharge Exam: Danley Danker Weights   03/05/22 0420  Weight: 89.8 kg   Constitutional: Awake alert and oriented x3, no associated distress.   Respiratory: clear to auscultation bilaterally, no wheezing, no crackles. Normal respiratory effort. No accessory muscle use.  Cardiovascular: Regular rate and rhythm, no murmurs / rubs / gallops. No extremity edema. 2+ pedal pulses. No carotid bruits.  Abdomen: Abdomen is soft and nontender.  No evidence of intra-abdominal masses.  Positive  bowel sounds noted in all quadrants.   Musculoskeletal: No joint deformity upper and lower extremities. Good ROM, no contractures. Normal muscle tone.     Condition at discharge: fair  The results of significant diagnostics from this hospitalization (including imaging, microbiology, ancillary and laboratory) are listed below for reference.   Imaging Studies: IR IMAGING GUIDED PORT INSERTION  Result Date: 03/07/2022 INDICATION: 51 year old female with history of metastatic pancreatic cancer requiring central venous access for chemotherapy administration. EXAM: IMPLANTED PORT A CATH PLACEMENT WITH ULTRASOUND AND FLUOROSCOPIC GUIDANCE COMPARISON:  None Available. MEDICATIONS: None. ANESTHESIA/SEDATION: Moderate (conscious) sedation was employed during this procedure. A total of Versed 4 mg and Fentanyl 100 mcg was administered intravenously. Moderate Sedation Time: 15 minutes. The patient's level of consciousness and vital signs were monitored continuously by radiology nursing throughout the procedure under my direct supervision. CONTRAST:  None FLUOROSCOPY TIME:  minutes,  seconds ( mGy) COMPLICATIONS: None immediate. PROCEDURE: The procedure, risks, benefits, and alternatives were explained to the patient. Questions  regarding the procedure were encouraged and answered. The patient understands and consents to the procedure. The right neck and chest were prepped with chlorhexidine in a sterile fashion, and a sterile drape was applied covering the operative field. Maximum barrier sterile technique with sterile gowns and gloves were used for the procedure. A timeout was performed prior to the initiation of the procedure. Ultrasound was used to examine the jugular vein which was compressible and free of internal echoes. A skin marker was used to demarcate the planned venotomy and port pocket incision sites. Local anesthesia was provided to these sites and the subcutaneous tunnel track with 1% lidocaine with  1:100,000 epinephrine. A small incision was created at the jugular access site and blunt dissection was performed of the subcutaneous tissues. Under ultrasound guidance, the jugular vein was accessed with a 21 ga micropuncture needle and an 0.018" wire was inserted to the superior vena cava. Real-time ultrasound guidance was utilized for vascular access including the acquisition of a permanent ultrasound image documenting patency of the accessed vessel. A 5 Fr micopuncture set was then used, through which a 0.035" Rosen wire was passed under fluoroscopic guidance into the inferior vena cava. An 8 Fr dilator was then placed over the wire. A subcutaneous port pocket was then created along the upper chest wall utilizing a combination of sharp and blunt dissection. The pocket was irrigated with sterile saline, packed with gauze, and observed for hemorrhage. A single lumen "ISP" sized power injectable port was chosen for placement. The 8 Fr catheter was tunneled from the port pocket site to the venotomy incision. The port was placed in the pocket. The external catheter was trimmed to appropriate length. The dilator was exchanged for an 8 Fr peel-away sheath under fluoroscopic guidance. The catheter was then placed through the sheath and the sheath was removed. Final catheter positioning was confirmed and documented with a fluoroscopic spot radiograph. The port was accessed with a Huber needle, aspirated, and flushed with heparinized saline. The deep dermal layer of the port pocket incision was closed with interrupted 3-0 Vicryl suture. Dermabond was then placed over the port pocket and neck incisions. The patient tolerated the procedure well without immediate post procedural complication. FINDINGS: After catheter placement, the tip lies within the superior cavoatrial junction. The catheter aspirates and flushes normally and is ready for immediate use. IMPRESSION: Successful placement of a power injectable Port-A-Cath via  the right internal jugular vein. The catheter is ready for immediate use. Ruthann Cancer, MD [facility] Radiology Electronically Signed   By: Ruthann Cancer M.D.   On: 03/07/2022 15:52   MR Lumbar Spine W Wo Contrast  Addendum Date: 03/05/2022   ADDENDUM REPORT: 03/05/2022 08:52 ADDENDUM: Study discussed by telephone with Dr. Georgina Snell in the ER on 03/05/2022 at 0838 hours. Electronically Signed   By: Genevie Ann M.D.   On: 03/05/2022 08:52   Result Date: 03/05/2022 CLINICAL DATA:  51 year old female with back and leg pain. Recently diagnosed with metastatic pancreatic adenocarcinoma. Myelopathy. EXAM: MRI LUMBAR SPINE WITHOUT AND WITH CONTRAST TECHNIQUE: Multiplanar and multiecho pulse sequences of the lumbar spine were obtained without and with intravenous contrast. CONTRAST:  4m GADAVIST GADOBUTROL 1 MMOL/ML IV SOLN COMPARISON:  Cervical and thoracic MRI today. Lumbar radiographs this morning. Abdomen MRI 02/09/2022. FINDINGS: Segmentation: Normal, concordant with the cervical and thoracic numbering today. Alignment: Minimal levoconvex lumbar scoliosis. Straightening of lumbar lordosis as demonstrated radiographically today. Vertebrae: Metastatic disease infiltrating the right  L1 vertebral body, most of the L3 vertebral body with associated bulky tumor extension into the ventral epidural space and mild pathologic fracture of the L3 inferior endplate (see below), in the posterosuperior L4 body, heterogeneously infiltrating the L5 body, the S1 body, and with bulky right L5-S1 posterior element tumor with some extraosseous extension (series 1, image 1). Heterogeneous enhancing metastasis also in the right sacral ala and probably the medial right iliac bone both visible on series 13, image 35. Conus medullaris and cauda equina: Conus extends to the L1 level. No lower spinal cord or conus signal abnormality. Malignant spinal stenosis (see below), but no  convincing abnormal intradural or cauda equina enhancement. And no definite dural thickening in the lumbar spine. Paraspinal and other soft tissues: Faintly visible liver metastases. Low level but suspicious retroperitoneal lymphadenopathy primarily on the left. Disc levels: Notable for L3: Bulky ventral epidural tumor (series 12, image 20) resulting in moderate to severe spinal stenosis and proximal bilateral L3 foraminal stenosis. L4-L5: Multifactorial moderate to severe spinal stenosis here appears primarily to be degenerative in nature, including bulky ligament flavum hypertrophy and disc bulging. L5-S1: Moderate to severe bilateral L5 foraminal stenosis here appears primarily to be degenerative in nature, but there is early extraosseous tumor involvement of the right S1 neural foramina suspected. IMPRESSION: 1. Heterogeneous lumbosacral metastatic disease, likely involvement of the right iliac bone also. 2. 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 3. Early tumor infiltration of the right S1 nerve course, in conjunction with L5-S1 posterior element tumor extraosseous extension. 4. Superimposed degenerative moderate to severe spinal stenosis at L4-L5 and foraminal stenosis at the bilateral L5 nerve levels. 5. No cauda equina metastases or lumbar dural metastatic disease identified. Electronically Signed: By: Genevie Ann M.D. On: 03/05/2022 08:30   MR THORACIC SPINE W WO CONTRAST  Addendum Date: 03/05/2022   ADDENDUM REPORT: 03/05/2022 08:47 ADDENDUM: Study discussed by telephone with Dr. Georgina Snell in the ER on 03/05/2022 at 0838 hours. Electronically Signed   By: Genevie Ann M.D.   On: 03/05/2022 08:47   Result Date: 03/05/2022 CLINICAL DATA:  51 year old female with back and leg pain. Recently diagnosed with metastatic pancreatic adenocarcinoma. Myelopathy. EXAM: MRI THORACIC WITHOUT  AND WITH CONTRAST TECHNIQUE: Multiplanar and multiecho pulse sequences of the thoracic spine were obtained without and with intravenous contrast. CONTRAST:  31m GADAVIST GADOBUTROL 1 MMOL/ML IV SOLN COMPARISON:  Cervical spine MRI today reported separately. Abdomen MRI 02/09/2022. FINDINGS: Limited cervical spine imaging:  Reported separately today. Thoracic spine segmentation:  Appears to be normal. Alignment: Mildly exaggerated upper thoracic kyphosis in the setting of T4 pathologic fracture, see below. Elsewhere maintained thoracic kyphosis. Vertebrae: Scattered thoracic vertebral metastatic disease: T2, T4, T9 and T10. Associated left T2 vertebral transverse process and posterior element involvement with extraosseous tumor extension (series 20, image 6), partially involving the posterior rib Associated moderate T4 pathologic compression fracture with some retropulsion of bone and/or tumor, see details below. Bulky posterior element metastasis and extraosseous/epidural tumor eccentric to the right at T9 and T10 (series 26, image 28), with extraosseous tumor extending into and along the right posterior rib, see details below. Benign T12 vertebral body hemangioma. Cord: Effaced ventral spinal cord at T4 secondary to epidural/retropulsed tumor (series 20, image 12). No cord compression or cord signal abnormality there. Mild malignant cord compression at T9 related to right posterior and lateral spinal epidural tumor  up to 5 mm in thickness (series 26, image 28). Cord mass effect when combined with T8-T9 disc bulging there. But no cord edema or myelomalacia. Mild combined degenerative and malignant spinal stenosis at T9-T10. No significant cord mass effect. Lower T10 vertebral level ventral epidural tumor (series 26, image 31) effaces the ventral CSF space with no cord compression. No lower thoracic spinal stenosis and conus medullaris appears to be normal at T12-L1. Abnormal dural thickening and enhancement but no  intradural or leptomeningeal enhancement. Paraspinal and other soft tissues: Extraosseous extension of paraspinal tumor on the left at T2, on the right at T9 as above. Trace bilateral layering pleural effusions. Grossly negative visible mediastinum. Multiple liver lesions as described on abdomen MRI last month. Disc levels: Combined thoracic spine degeneration and malignancy resulting in multilevel spinal stenosis, reiterating as above this is most pronounced at T2-T3: Malignant severe left T2 foraminal stenosis. T4: Pathologic compression fracture with retropulsion and/or epidural tumor mild spinal stenosis without compression). T8-T9: Left eccentric disc degeneration and broad-based posterior disc combined on 5 mm thick right posterior and lateral epidural tumor with mild cord compression. No cord edema. Severe combined degenerative and malignant right T8 neural foraminal stenosis. T9-T10: Broad-based and lobulated posterior disc bulging superimposed on dural thickening. Mild spinal stenosis without cord compression. But severe combined degenerative and malignant right T9 foraminal stenosis. T10-T11: Ventral epidural tumor (series 26, image 31) with mild spinal stenosis, no cord compression. Malignant appearing moderate to severe right T10 foraminal stenosis. IMPRESSION: 1. Multifocal metastatic disease to the Thoracic Spine with pathologic fracture of T4 (moderate) and extraosseous and/or epidural tumor contributing to: - spinal stenosis at T4, T9, and T10. - Spinal Cord Compression at T9 (mild) with no cord edema. - Severe foraminal neural impingement at the left T2, right T8, T9 and T10 nerve levels. 2. Pronounced posterior dural/epidural tumor at T9 and T10 in association with #1. Extraosseous tumor tracking along the posterior ribs on the left at T2, on the right at T9. 3. Liver metastases.  Trace layering pleural effusions. Electronically Signed: By: Genevie Ann M.D. On: 03/05/2022 08:22   MR Cervical Spine W  or Wo Contrast  Addendum Date: 03/05/2022   ADDENDUM REPORT: 03/05/2022 08:44 ADDENDUM: Study discussed by telephone with Dr. Georgina Snell in the ER on 03/05/2022 at 0838 hours. Electronically Signed   By: Genevie Ann M.D.   On: 03/05/2022 08:44   Result Date: 03/05/2022 CLINICAL DATA:  51 year old female with back and leg pain. Recently diagnosed with metastatic pancreatic adenocarcinoma. Myelopathy. EXAM: MRI CERVICAL SPINE WITHOUT AND WITH CONTRAST TECHNIQUE: Multiplanar and multiecho pulse sequences of the cervical spine, to include the craniocervical junction and cervicothoracic junction, were obtained without and with intravenous contrast. CONTRAST:  35m GADAVIST GADOBUTROL 1 MMOL/ML IV SOLN COMPARISON:  Brain MRI 09/15/2020.  Cervical spine CT 09/30/2020. FINDINGS: Alignment: Chronic straightening of cervical lordosis. Mild reversal is new from last year. No significant spondylolisthesis. Vertebrae: Visible skull base bone marrow signal is stable since last year and within normal limits. C1 through C3 appear to remain normal. But confluent abnormal marrow signal and heterogeneous enhancement throughout the C4 and C5 vertebral bodies consistent with metastatic disease. Some posterior element involvement at both levels. The C6 vertebra appears largely spared. But there is patchy metastatic involvement of the left C7 vertebral body suspected (series 7, image 10). No definite pathologic compression fracture at this time. Upper thoracic metastases are reported separately today. Cord: Abnormal cervical spinal cord mass effect  from C3-C4 through C5-C6 is in part degenerative (calcified ligament flavum hypertrophy on the right at C3-C4 series 8, image 14) but appears partially malignant due to abnormal dural thickening and enhancement from C3-C7 (series 11, image 7). Circumferential abnormal dura or epidural space at both the C4 and C5 levels (series 12, image 25). Multilevel mild spinal cord compression  with no convincing cord edema. No myelomalacia. Below C5 the spinal canal and cord appear normal. No abnormal intramedullary enhancement. Posterior Fossa, vertebral arteries, paraspinal tissues: Cervicomedullary junction is within normal limits. Visible posterior fossa appears stable and negative. Preserved major vascular flow voids in the neck with dominant right vertebral artery. However, questionable extraosseous paraspinal muscle and transverse foraminal tumor on the left at C4 (series 12, image 20). There seems to be mass effect on the non dominant left vertebral artery there. And there is also trace abnormal prevertebral fluid from C2-C5 (series 7, image 8). But no generalized paraspinal soft tissue inflammation. Disc levels: Superimposed degenerative cervical spinal cord changes contributing to spinal stenosis and mild spinal cord mass effect at: C3-C4: Circumferential disc osteophyte complex and chronic calcified right ligament flavum hypertrophy (series 8, image 14). C4-C5: Circumferential disc bulging with a broad-based posterior component. C5-C6: Disc space loss with circumferential disc osteophyte complex. Broad-based posterior component. IMPRESSION: 1. Positive for Cervical Spine Metastatic disease. Dominant C4 and C5 vertebra body metastases, with associated abnormal dural thickening and enhancement C3 through C6, and suspicion of left C4 extraosseous tumor extension affecting the C5 neural foramen and non dominant left vertebral artery there. 2. Combined degenerative and malignant spinal stenosis with Mild Spinal Cord Compression from C3-C4 through C5-C6. No convincing cord edema, myelomalacia. No intramedullary spinal cord metastasis. 3. Trace prevertebral space fluid from C2 to C5 appears reactive. No strong evidence of spinal infection, although could appear similar to the above. 4. Thoracic spinal metastases detailed separately today. Electronically Signed: By: Genevie Ann M.D. On: 03/05/2022 08:08    DG Lumbar Spine Complete  Result Date: 03/05/2022 CLINICAL DATA:  51 year old female with back and leg pain. No known injury. Recently diagnosed metastatic Pancreatic adenocarcinoma. EXAM: LUMBAR SPINE - COMPLETE 4+ VIEW COMPARISON:  Chest CT 02/15/2022. CT Abdomen and Pelvis 02/08/2022. FINDINGS: Normal lumbar segmentation. Bone mineralization is within normal limits. Stable lumbar lordosis from the CT last month. Subtle anterolisthesis of L4 on L5. L3-L4 through L5-S1 disc space loss and endplate spurring, severe at L5-S1. Vacuum disc there and at L4-L5 on the CT last month. No pars fracture. Visible lower thoracic levels appear intact. Visible sacrum appears intact. No acute osseous abnormality identified. Grossly negative visible lung bases. Non obstructed bowel gas pattern. IMPRESSION: No acute osseous abnormality identified in the lumbar spine. Disc and endplate degeneration L8-L3 through L5-S1, severe at the latter. Electronically Signed   By: Genevie Ann M.D.   On: 03/05/2022 06:36   US BIOPSY (LIVER)  Result Date: 02/20/2022 INDICATION: Concern for metastatic pancreatic cancer. Please perform ultrasound-guided liver lesion biopsy for tissue diagnostic purposes. EXAM: ULTRASOUND GUIDED LIVER LESION BIOPSY COMPARISON:  CT abdomen and pelvis-02/08/2022; abdominal MRI-01/30/2022 MEDICATIONS: None ANESTHESIA/SEDATION: Moderate (conscious) sedation was employed during this procedure as administered by the Interventional Radiology RN. A total of Versed 1 mg and Fentanyl 25 mcg was administered intravenously. Moderate Sedation Time: 20 minutes. The patient's level of consciousness and vital signs were monitored continuously by radiology nursing throughout the procedure under my direct supervision. COMPLICATIONS: None immediate. PROCEDURE: Informed written consent was obtained from the patient after a  discussion of the risks, benefits and alternatives to treatment. The patient understands and consents the  procedure. A timeout was performed prior to the initiation of the procedure. Ultrasound scanning was performed of the right upper abdominal quadrant demonstrates several ill-defined isoechoic liver lesions scattered throughout the liver though evaluation of the lesions was degraded secondary to a combination of the small size of the individual nodules as well as poor sonographic window due to patient body habitus. An approximately 1.3 x 1.1 cm lesion within the right lobe of the liver (image 9), likely correlating with the lesion seen on preceding abdominal CT image 13, series 2) was targeted for biopsy given location and sonographic window. The procedure was planned. The right upper abdominal quadrant was prepped and draped in the usual sterile fashion. The overlying soft tissues were anesthetized with 1% lidocaine with epinephrine. A 17 gauge, 6.8 cm co-axial needle was advanced into a peripheral aspect of the lesion. This was followed by 4 core biopsies with an 18 gauge core device under direct ultrasound guidance. The coaxial needle tract was embolized with a small amount of Gel-Foam slurry and superficial hemostasis was obtained with manual compression. Post procedural scanning was negative for definitive area of hemorrhage or additional complication. A dressing was applied. The patient tolerated the procedure well without immediate post procedural complication. IMPRESSION: Technically successful ultrasound guided core needle biopsy of indeterminate lesion within the right lobe of the liver. Electronically Signed   By: Sandi Mariscal M.D.   On: 02/20/2022 15:44   CT Chest W Contrast  Result Date: 02/16/2022 CLINICAL DATA:  Pancreatic cancer.  Staging.  * Tracking Code: BO * EXAM: CT CHEST WITH CONTRAST TECHNIQUE: Multidetector CT imaging of the chest was performed during intravenous contrast administration. RADIATION DOSE REDUCTION: This exam was performed according to the departmental dose-optimization  program which includes automated exposure control, adjustment of the mA and/or kV according to patient size and/or use of iterative reconstruction technique. CONTRAST:  58m OMNIPAQUE IOHEXOL 300 MG/ML  SOLN COMPARISON:  None Available. FINDINGS: Cardiovascular: The heart size is normal. No substantial pericardial effusion. No thoracic aortic aneurysm. No substantial atherosclerosis of the thoracic aorta. Mediastinum/Nodes: Lymph nodes of normal to upper normal size are seen in the anterior mediastinum. No mediastinal lymphadenopathy. 9 mm short axis right hilar node evident with no hilar lymphadenopathy by CT size criteria. The esophagus has normal imaging features. There is no axillary lymphadenopathy. Lungs/Pleura: 3 mm right middle lobe perifissural nodule seen on image 78/4. 3 mm right lower lobe perifissural nodule seen on image 70/4. No overtly suspicious pulmonary nodule or mass. No focal airspace consolidation. Subsegmental atelectasis noted in both lung bases. No pleural effusion. Upper Abdomen: Please see report for dedicated abdominal MRI performed 6 days ago. Musculoskeletal: No worrisome lytic or sclerotic osseous abnormality. IMPRESSION: 1. No definite findings to suggest metastatic disease in the chest. 2. Lymph nodes of normal to upper normal size are seen in the anterior mediastinum and right hilum. Attention on follow-up recommended. 3. 3 mm right middle and lower lobe perifissural nodules, likely benign. These could also be reassessed at the time of restaging. Electronically Signed   By: EMisty StanleyM.D.   On: 02/16/2022 15:27   MR Abdomen W Wo Contrast  Result Date: 02/09/2022 CLINICAL DATA:  Evaluate for metastatic disease. EXAM: MRI ABDOMEN WITHOUT AND WITH CONTRAST TECHNIQUE: Multiplanar multisequence MR imaging of the abdomen was performed both before and after the administration of intravenous contrast. CONTRAST:  923mGADAVIST GADOBUTROL  1 MMOL/ML IV SOLN COMPARISON:  CT AP  02/08/2022 FINDINGS: Lower chest: No acute findings. Hepatobiliary: Multiple rim enhancing lesions are scattered throughout both lobes of liver compatible with metastatic disease. Index lesions include: -segment 8/4 lesion measures 1.8 x 1.5 cm, image 36/16. -segment 6 lesion measures 1.6 x 1.3 cm, image 52/16. -segment 2 lesion measures 1.4 x 1.0 cm, image 33/16. Gallbladder appears within normal limits. Common bile duct measures up to 5 mm, image 14/3. No intrahepatic bile duct dilatation. Pancreas: There is no main duct dilatation. No pancreatic inflammation. Heterogeneous, centrally necrotic mass arising off the tail of pancreas measures 2.9 x 2.1 cm, image 43/16. Spleen:  Within normal limits in size and appearance. Adrenals/Urinary Tract: Normal adrenal glands. No nephrolithiasis, hydronephrosis or kidney mass. Stomach/Bowel: Visualized portions within the abdomen are unremarkable. Vascular/Lymphatic: Normal caliber abdominal aorta. The upper abdominal vascularity is patent. Enlarged portacaval lymph node measures 1.6 cm short axis, image 13/5 left periaortic lymph node measures 1.1 cm, image 23/5. Other:  No ascites. Musculoskeletal: No suspicious bone lesions identified. IMPRESSION: 1. Multiple rim enhancing lesions scattered throughout both lobes of liver compatible with metastatic disease. 2. Heterogeneous, centrally necrotic mass arising off the tail of pancreas measures 2.9 x 2.1 cm. This is concerning for a primary pancreatic neoplasm. 3. Enlarged portacaval and left periaortic lymph nodes compatible with metastatic disease. Electronically Signed   By: Kerby Moors M.D.   On: 02/09/2022 13:24   MR 3D Recon At Scanner  Result Date: 02/09/2022 CLINICAL DATA:  Evaluate for metastatic disease. EXAM: MRI ABDOMEN WITHOUT AND WITH CONTRAST TECHNIQUE: Multiplanar multisequence MR imaging of the abdomen was performed both before and after the administration of intravenous contrast. CONTRAST:  46m GADAVIST  GADOBUTROL 1 MMOL/ML IV SOLN COMPARISON:  CT AP 02/08/2022 FINDINGS: Lower chest: No acute findings. Hepatobiliary: Multiple rim enhancing lesions are scattered throughout both lobes of liver compatible with metastatic disease. Index lesions include: -segment 8/4 lesion measures 1.8 x 1.5 cm, image 36/16. -segment 6 lesion measures 1.6 x 1.3 cm, image 52/16. -segment 2 lesion measures 1.4 x 1.0 cm, image 33/16. Gallbladder appears within normal limits. Common bile duct measures up to 5 mm, image 14/3. No intrahepatic bile duct dilatation. Pancreas: There is no main duct dilatation. No pancreatic inflammation. Heterogeneous, centrally necrotic mass arising off the tail of pancreas measures 2.9 x 2.1 cm, image 43/16. Spleen:  Within normal limits in size and appearance. Adrenals/Urinary Tract: Normal adrenal glands. No nephrolithiasis, hydronephrosis or kidney mass. Stomach/Bowel: Visualized portions within the abdomen are unremarkable. Vascular/Lymphatic: Normal caliber abdominal aorta. The upper abdominal vascularity is patent. Enlarged portacaval lymph node measures 1.6 cm short axis, image 13/5 left periaortic lymph node measures 1.1 cm, image 23/5. Other:  No ascites. Musculoskeletal: No suspicious bone lesions identified. IMPRESSION: 1. Multiple rim enhancing lesions scattered throughout both lobes of liver compatible with metastatic disease. 2. Heterogeneous, centrally necrotic mass arising off the tail of pancreas measures 2.9 x 2.1 cm. This is concerning for a primary pancreatic neoplasm. 3. Enlarged portacaval and left periaortic lymph nodes compatible with metastatic disease. Electronically Signed   By: TKerby MoorsM.D.   On: 02/09/2022 13:24   CT Abdomen Pelvis W Contrast  Result Date: 02/08/2022 CLINICAL DATA:  Right lower quadrant abdominal pain. Two weeks of right upper quadrant abdominal pain, especially after eating solids. No given history of malignancy. EXAM: CT ABDOMEN AND PELVIS WITH  CONTRAST TECHNIQUE: Multidetector CT imaging of the abdomen and pelvis was performed using the  standard protocol following bolus administration of intravenous contrast. RADIATION DOSE REDUCTION: This exam was performed according to the departmental dose-optimization program which includes automated exposure control, adjustment of the mA and/or kV according to patient size and/or use of iterative reconstruction technique. CONTRAST:  137m OMNIPAQUE IOHEXOL 300 MG/ML  SOLN COMPARISON:  Abdominal ultrasound 01/25/2022 and 05/06/2020. No prior CT available. FINDINGS: Lower chest: Linear scarring at both lung bases. No suspicious pulmonary nodules are demonstrated. No significant pleural or pericardial effusion. Hepatobiliary: There are numerous ill-defined hypoattenuating lesions throughout all segments of the liver, worrisome for multifocal hepatic metastatic disease. Representative lesions include a 1.3 cm lesion anteriorly in the right lobe on image 13/2, a 1.0 cm lesion inferiorly in the right lobe on image 23/2 and a 1.2 cm lesion in the left lobe on image 15/2. Incomplete gallbladder distension with possible small polyp laterally. No evidence of gallstones or pericholecystic fluid. No biliary dilatation. Pancreas: There is an ill-defined mass in the pancreatic tail measuring 3.3 x 2.4 cm on image 18/2, worrisome for pancreatic malignancy based on the hepatic findings. The remainder of the pancreas appears normal, without ductal dilatation or definite surrounding inflammation. Spleen: Normal in size without focal abnormality. Adrenals/Urinary Tract: Both adrenal glands appear normal. No evidence of urinary tract calculus, suspicious renal lesion or hydronephrosis. There is a presumed phlebolith near the left ureterovesical junction. The bladder appears unremarkable for its degree of distention. Stomach/Bowel: Enteric contrast was administered and has passed into the distal colon. Postsurgical changes consistent  with previous gastric sleeve resection. No evidence of bowel distension, surrounding inflammation or wall thickening. The appendix appears normal. There are diverticular changes within the sigmoid colon without evidence of acute inflammation. Vascular/Lymphatic: Prominent lymph nodes within the upper abdomen which must be viewed with some suspicion given the other findings above. There is a 9 mm node in the porta hepatis on image 20/2, a 1.5 cm portacaval node on image 22/2 and a left periaortic node measuring 1.0 cm on image 29/2. Mild aortic and branch vessel atherosclerosis without evidence of aneurysm. The portal, superior mesenteric and splenic veins are patent. Reproductive: The uterus and ovaries appear unremarkable. No suspicious adnexal masses. Other: No ascites or peritoneal nodularity.  Intact abdominal wall. Musculoskeletal: No acute osseous findings or evidence of osseous metastatic disease. Spondylosis in the lower lumbar spine. IMPRESSION: 1. Ill-defined mass in the pancreatic tail with numerous ill-defined hypoattenuating lesions throughout all segments of the liver, worrisome for pancreatic malignancy with multifocal hepatic metastatic disease. Recommend further evaluation with abdominal MRI without and with contrast. 2. Mildly prominent upper abdominal lymph nodes are nonspecific, but could reflect nodal metastases. 3. No evidence of bowel obstruction or perforation. The appendix appears normal. 4.  Aortic Atherosclerosis (ICD10-I70.0). 5. These results will be called to the ordering clinician or representative by the Radiologist Assistant, and communication documented in the PACS or [employer] Electronically Signed   By: WRichardean SaleM.D.   On: 02/08/2022 12:07    Microbiology: Results for orders placed or performed in visit on 04/06/19  SARS Coronavirus 2 (TAT 6-24 hrs)     Status: None   Collection Time: 04/06/19 12:00 AM  Result Value Ref Range Status   SARS Coronavirus 2  RESULT:  NEGATIVE  Final    Comment: RESULT:  NEGATIVESARS-CoV-2 INTERPRETATION:A NEGATIVE  test result means that SARS-CoV-2 RNA was not present in the specimen above the limit of detection of this test. This does not preclude a possible SARS-CoV-2 infection and should  not be used as the  sole basis for patient management decisions. Negative results must be combined with clinical observations, patient history, and epidemiological information. Optimum specimen types and timing for peak viral levels during infections caused by SARS-CoV-2  have not been determined. Collection of multiple specimens or types of specimens may be necessary to detect virus. Improper specimen collection and handling, sequence variability under primers/probes, or organism present below the limit of detection may  lead to false negative results. Positive and negative predictive values of testing are highly dependent on prevalence. False negative test results are more likely when prevalence of disease is high.The expected result is NEGATIVE.Fact  Sheet for  Healthcare Providers: https://www.woods-mathews.com/.Fact Sheet for Patients: SugarRoll.be.Normal Reference Range - Negative     Labs: CBC: Recent Labs  Lab 03/05/22 0550 03/06/22 0523 03/08/22 0612  WBC 7.2 11.6* 7.3  NEUTROABS 4.1  --  6.5  HGB 12.2 11.9* 11.4*  HCT 36.7 35.6* 35.1*  MCV 92.9 93.7 95.6  PLT 322 329 803   Basic Metabolic Panel: Recent Labs  Lab 03/05/22 0550 03/06/22 0523 03/08/22 0612  NA 136 137 137  K 3.5 3.7 3.9  CL 99 103 103  CO2 '26 24 26  '$ GLUCOSE 124* 145* 155*  BUN '12 16 15  '$ CREATININE 0.54 0.58 0.57  CALCIUM 10.0 10.2 8.8*  MG  --   --  2.5*   Liver Function Tests: Recent Labs  Lab 03/05/22 0550 03/06/22 0523 03/08/22 0612  AST 30 24 37  ALT '29 25 31  '$ ALKPHOS 125 117 134*  BILITOT 0.9 0.6 0.5  PROT 8.7* 7.9 7.6  ALBUMIN 4.0 3.8 3.5   CBG: No results for input(s): "GLUCAP"  in the last 168 hours.  Discharge time spent: greater than 30 minutes.  Signed: Vernelle Emerald, MD [facility] 03/08/2022

## 2022-03-08 NOTE — Progress Notes (Signed)
Initial Nutrition Assessment  DOCUMENTATION CODES:   Obesity unspecified  INTERVENTION:   -Multivitamin with minerals daily  -Patient to resume vitamin regimen and protein supplements at home  NUTRITION DIAGNOSIS:   Increased nutrient needs related to cancer and cancer related treatments as evidenced by estimated needs.  GOAL:   Patient will meet greater than or equal to 90% of their needs  MONITOR:   PO intake, Weight trends, I & O's  REASON FOR ASSESSMENT:   Malnutrition Screening Tool    ASSESSMENT:   52 y.o. 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  Patient presented to [facility] emergency department initially with presenting with thigh pain.  11/15 s/p PAC placement  Patient in room, feeling well. Pt states she plans to get back to a plant based eating plan. She already avoids red meats and pork. Main protein sources are [place] and homemade protein shakes she makes at home or [employer]. Emphasized the importance of maintaining protein consumption and continuing vitamin regimen (h/o gastric sleeve surgery in 2019). Pt reports she takes bariatric MVI, calcium chews and hair/skin/nails vitamin daily. Pt to resume vitamins at home. States she may discharge today vs tomorrow. Offered to liberalize diet to regular but pt was okay staying on heart healthy diet.   Per weight records, pt has lost 9 lbs since 8/15 (4% wt loss x 3 months, insignificant for time frame).  Medications: Colace, Lactulose  Labs  reviewed: Elevated Mg   NUTRITION - FOCUSED PHYSICAL EXAM:  No depletions noted.  Diet Order:   Diet Order             Diet Heart Room service appropriate? Yes; Fluid consistency: Thin  Diet effective now                   EDUCATION NEEDS:   Education needs have been addressed  Skin:  Skin Assessment: Reviewed RN Assessment  Last BM:   11/13  Height:   Ht Readings from Last 1 Encounters:  03/05/22 '5\' 5"'$  (1.651 m)    Weight:   Wt Readings from Last 1 Encounters:  03/05/22 89.8 kg    BMI:  Body mass index is 32.95 kg/m.  Estimated Nutritional Needs:   Kcal:  1700-1900  Protein:  85-100g  Fluid:  1.9L/day  Clayton Bibles, MS, RD, LDN Inpatient Clinical Dietitian Contact information available via Amion

## 2022-03-08 NOTE — Discharge Instructions (Signed)
Please go to all follow-up appointments including following up with Dr. [name] your oncologist and going to all radiation oncology appointments for continued treatment You may shower 24 hours after the placement of your Port-A-Cath procedure on 11/15.  You may not submerges catheter in water until least 7 days after the procedure. Please follow-up with your primary care provider within the next 1 to 2 weeks. Please take all new prescribed occasions as instructed including your Decadron and Diflucan.  Each of these medications have been prescribed to you for a month but upon follow-up with your outpatient providers they may recommend that you discontinue or adjust these regimens accordingly. Emergency department if you develop any worsening pain weakness or fever.

## 2022-03-09 ENCOUNTER — Telehealth: Payer: Self-pay

## 2022-03-09 ENCOUNTER — Other Ambulatory Visit: Payer: Self-pay | Admitting: *Deleted

## 2022-03-09 ENCOUNTER — Ambulatory Visit
Admission: RE | Admit: 2022-03-09 | Discharge: 2022-03-09 | Disposition: A | Discharge status: Home / Self Care | Payer: BC Managed Care – PPO | Source: Ambulatory Visit | Attending: Radiation Oncology | Admitting: Radiation Oncology

## 2022-03-09 ENCOUNTER — Other Ambulatory Visit: Payer: Self-pay

## 2022-03-09 ENCOUNTER — Encounter: Payer: Self-pay | Admitting: *Deleted

## 2022-03-09 DIAGNOSIS — Z510 Encounter for antineoplastic radiation therapy: Secondary | ICD-10-CM | POA: Diagnosis not present

## 2022-03-09 DIAGNOSIS — K8689 Other specified diseases of pancreas: Secondary | ICD-10-CM

## 2022-03-09 DIAGNOSIS — C7951 Secondary malignant neoplasm of bone: Secondary | ICD-10-CM | POA: Insufficient documentation

## 2022-03-09 LAB — RAD ONC ARIA SESSION SUMMARY
Course Elapsed Days: 3
Plan Fractions Treated to Date: 4
Plan Fractions Treated to Date: 4
Plan Fractions Treated to Date: 4
Plan Prescribed Dose Per Fraction: 3 Gy
Plan Prescribed Dose Per Fraction: 3 Gy
Plan Prescribed Dose Per Fraction: 3 Gy
Plan Total Fractions Prescribed: 10
Plan Total Fractions Prescribed: 10
Plan Total Fractions Prescribed: 10
Plan Total Prescribed Dose: 30 Gy
Plan Total Prescribed Dose: 30 Gy
Plan Total Prescribed Dose: 30 Gy
Reference Point Dosage Given to Date: 12 Gy
Reference Point Dosage Given to Date: 12 Gy
Reference Point Dosage Given to Date: 12 Gy
Reference Point Session Dosage Given: 3 Gy
Reference Point Session Dosage Given: 3 Gy
Reference Point Session Dosage Given: 3 Gy
Session Number: 4

## 2022-03-09 MED ORDER — OXYCODONE HCL 10 MG PO TABS: 10.0000 mg | ORAL_TABLET | Freq: Four times a day (QID) | ORAL | 0 refills | Status: DC | PRN

## 2022-03-09 NOTE — Telephone Encounter (Signed)
Transition Care Management Unsuccessful Follow-up Telephone Call  Date of discharge and from where:  [facility]  Attempts:  1st Attempt  Reason for unsuccessful TCM follow-up call:  Unable to reach patient

## 2022-03-09 NOTE — Progress Notes (Signed)
Patient was discharged from the hospital yesterday. She went home on oxycodone '5mg'$  q6h. She states this isn't treating her pain. She is also using a heating pad. She still c/o severe pain and inability to sleep. She needs something stronger.   Spoke with Dr [name]. He would like patient to double her prescription - '10mg'$  q6h.   Called and spoke to patient. New prescription sent. Pharmacy confirmed.   Oncology Nurse Navigator Documentation     03/09/2022    9:00 AM  Oncology Nurse Navigator Flowsheets  Navigator Follow Up Date: 03/12/2022  Navigator Follow Up Reason: Appointment Review  Navigator Location [facility]  Navigator Encounter Type Telephone  Telephone Symptom Mgt;Medication Assistance;Incoming Call  Patient Visit Type MedOnc  Treatment Phase Active Tx  Barriers/Navigation Needs Coordination of Care;Education  Education Pain/ Symptom Management  Interventions Medication Assistance;Education  Acuity Level 2-Minimal Needs (1-2 Barriers Identified)  Education Method Verbal  Support Groups/Services Friends and Family  Time Spent with Patient 15

## 2022-03-11 ENCOUNTER — Ambulatory Visit: Payer: BC Managed Care – PPO

## 2022-03-12 ENCOUNTER — Encounter: Payer: Self-pay | Admitting: *Deleted

## 2022-03-12 ENCOUNTER — Ambulatory Visit
Admission: RE | Admit: 2022-03-12 | Discharge: 2022-03-12 | Disposition: A | Discharge status: Home / Self Care | Payer: BC Managed Care – PPO | Source: Ambulatory Visit | Attending: Radiation Oncology | Admitting: Radiation Oncology

## 2022-03-12 ENCOUNTER — Other Ambulatory Visit: Payer: Self-pay

## 2022-03-12 DIAGNOSIS — C259 Malignant neoplasm of pancreas, unspecified: Secondary | ICD-10-CM | POA: Diagnosis not present

## 2022-03-12 DIAGNOSIS — Z510 Encounter for antineoplastic radiation therapy: Secondary | ICD-10-CM | POA: Diagnosis not present

## 2022-03-12 DIAGNOSIS — C7951 Secondary malignant neoplasm of bone: Secondary | ICD-10-CM | POA: Diagnosis not present

## 2022-03-12 LAB — RAD ONC ARIA SESSION SUMMARY
Course Elapsed Days: 6
Plan Fractions Treated to Date: 5
Plan Fractions Treated to Date: 5
Plan Fractions Treated to Date: 5
Plan Fractions Treated to Date: 5
Plan Prescribed Dose Per Fraction: 3 Gy
Plan Prescribed Dose Per Fraction: 3 Gy
Plan Prescribed Dose Per Fraction: 3 Gy
Plan Prescribed Dose Per Fraction: 3 Gy
Plan Total Fractions Prescribed: 10
Plan Total Fractions Prescribed: 10
Plan Total Fractions Prescribed: 10
Plan Total Fractions Prescribed: 10
Plan Total Prescribed Dose: 30 Gy
Plan Total Prescribed Dose: 30 Gy
Plan Total Prescribed Dose: 30 Gy
Plan Total Prescribed Dose: 30 Gy
Reference Point Dosage Given to Date: 15 Gy
Reference Point Dosage Given to Date: 15 Gy
Reference Point Dosage Given to Date: 15 Gy
Reference Point Dosage Given to Date: 15 Gy
Reference Point Session Dosage Given: 3 Gy
Reference Point Session Dosage Given: 3 Gy
Reference Point Session Dosage Given: 3 Gy
Reference Point Session Dosage Given: 3 Gy
Session Number: 5

## 2022-03-12 NOTE — Telephone Encounter (Signed)
Transition Care Management Unsuccessful Follow-up Telephone Call  Date of discharge and from where:  [facility]  Attempts:  2nd Attempt  Reason for unsuccessful TCM follow-up call:  Unable to reach patient

## 2022-03-12 NOTE — Progress Notes (Unsigned)
Patient has been discharged from the hospital. She will finish her radiation on 03/20/2022. Spoke to Dr [name] and he would like patient to come in to start chemo with MD visit on the week of December 4th. Message sent to scheduling.   Oncology Nurse Navigator Documentation     03/12/2022    8:15 AM  Oncology Nurse Navigator Flowsheets  Navigator Follow Up Date: 03/26/2022  Navigator Follow Up Reason: Follow-up Appointment;Chemotherapy  Navigator Location [facility]  Navigator Encounter Type Appt/Treatment Plan Review  Patient Visit Type MedOnc  Treatment Phase Active Tx  Barriers/Navigation Needs Coordination of Care;Education  Interventions Coordination of Care  Acuity Level 2-Minimal Needs (1-2 Barriers Identified)  Coordination of Care Appts  Support Groups/Services Friends and Family  Time Spent with Patient 15

## 2022-03-13 ENCOUNTER — Other Ambulatory Visit: Payer: Self-pay

## 2022-03-13 ENCOUNTER — Encounter: Payer: Self-pay | Admitting: *Deleted

## 2022-03-13 ENCOUNTER — Other Ambulatory Visit: Payer: Self-pay | Admitting: *Deleted

## 2022-03-13 ENCOUNTER — Ambulatory Visit
Admission: RE | Admit: 2022-03-13 | Discharge: 2022-03-13 | Disposition: A | Discharge status: Home / Self Care | Payer: BC Managed Care – PPO | Source: Ambulatory Visit | Attending: Radiation Oncology | Admitting: Radiation Oncology

## 2022-03-13 ENCOUNTER — Inpatient Hospital Stay: Payer: BC Managed Care – PPO

## 2022-03-13 DIAGNOSIS — Z510 Encounter for antineoplastic radiation therapy: Secondary | ICD-10-CM | POA: Diagnosis not present

## 2022-03-13 DIAGNOSIS — C7951 Secondary malignant neoplasm of bone: Secondary | ICD-10-CM | POA: Diagnosis not present

## 2022-03-13 DIAGNOSIS — C259 Malignant neoplasm of pancreas, unspecified: Secondary | ICD-10-CM

## 2022-03-13 LAB — RAD ONC ARIA SESSION SUMMARY
Course Elapsed Days: 7
Plan Fractions Treated to Date: 6
Plan Fractions Treated to Date: 6
Plan Fractions Treated to Date: 6
Plan Fractions Treated to Date: 6
Plan Prescribed Dose Per Fraction: 3 Gy
Plan Prescribed Dose Per Fraction: 3 Gy
Plan Prescribed Dose Per Fraction: 3 Gy
Plan Prescribed Dose Per Fraction: 3 Gy
Plan Total Fractions Prescribed: 10
Plan Total Fractions Prescribed: 10
Plan Total Fractions Prescribed: 10
Plan Total Fractions Prescribed: 10
Plan Total Prescribed Dose: 30 Gy
Plan Total Prescribed Dose: 30 Gy
Plan Total Prescribed Dose: 30 Gy
Plan Total Prescribed Dose: 30 Gy
Reference Point Dosage Given to Date: 18 Gy
Reference Point Dosage Given to Date: 18 Gy
Reference Point Dosage Given to Date: 18 Gy
Reference Point Dosage Given to Date: 18 Gy
Reference Point Session Dosage Given: 3 Gy
Reference Point Session Dosage Given: 3 Gy
Reference Point Session Dosage Given: 3 Gy
Reference Point Session Dosage Given: 3 Gy
Session Number: 6

## 2022-03-13 MED ORDER — ONDANSETRON HCL 8 MG PO TABS: 8.0000 mg | ORAL_TABLET | Freq: Three times a day (TID) | ORAL | 1 refills | Status: DC | PRN

## 2022-03-13 MED ORDER — DEXAMETHASONE 4 MG PO TABS: 8.0000 mg | ORAL_TABLET | Freq: Every day | ORAL | 1 refills | Status: DC

## 2022-03-13 MED ORDER — LIDOCAINE-PRILOCAINE 2.5-2.5 % EX CREA: TOPICAL_CREAM | CUTANEOUS | 3 refills | Status: DC

## 2022-03-13 MED ORDER — PROCHLORPERAZINE MALEATE 10 MG PO TABS: 10.0000 mg | ORAL_TABLET | Freq: Four times a day (QID) | ORAL | 1 refills | Status: DC | PRN

## 2022-03-13 MED ORDER — LOPERAMIDE HCL 2 MG PO CAPS: ORAL_CAPSULE | ORAL | 5 refills | Status: DC

## 2022-03-13 NOTE — Progress Notes (Unsigned)
Patient in chemotherapy education class with niece and sister.  Discussed side effects of 5FU, Oxaliplatin, Leucovorin, Irinotecan which include but are not limited to myelosuppression, decreased appetite, fatigue, fever, allergic or infusional reaction, mucositis, cardiac toxicity, cough, SOB, altered taste, nausea and vomiting, diarrhea, constipation, elevated LFTs myalgia and arthralgias, hair loss or thinning, rash, skin dryness, nail changes, peripheral neuropathy, discolored urine, delayed wound healing, mental changes (Chemo brain), increased risk of infections, weight loss.  Reviewed infusion room and office policy and procedure and phone numbers 24 hours x 7 days a week.  Reviewed ambulatory pump specifics and how to manage safe handling at home.  Reviewed when to call the office with any concerns or problems.  Scientist, clinical (histocompatibility and immunogenetics) given.  Discussed portacath insertion and EMLA cream administration.  Antiemetic protocol and chemotherapy schedule reviewed. Patient verbalized understanding of chemotherapy indications and possible side effects.  Teachback done

## 2022-03-14 ENCOUNTER — Ambulatory Visit
Admission: RE | Admit: 2022-03-14 | Discharge: 2022-03-14 | Disposition: A | Discharge status: Home / Self Care | Payer: BC Managed Care – PPO | Source: Ambulatory Visit | Attending: Radiation Oncology | Admitting: Radiation Oncology

## 2022-03-14 ENCOUNTER — Encounter: Payer: Self-pay | Admitting: *Deleted

## 2022-03-14 ENCOUNTER — Ambulatory Visit: Payer: BC Managed Care – PPO

## 2022-03-14 ENCOUNTER — Other Ambulatory Visit: Payer: Self-pay

## 2022-03-14 ENCOUNTER — Encounter: Payer: Self-pay | Admitting: Hematology & Oncology

## 2022-03-14 DIAGNOSIS — Z510 Encounter for antineoplastic radiation therapy: Secondary | ICD-10-CM | POA: Diagnosis not present

## 2022-03-14 DIAGNOSIS — C7951 Secondary malignant neoplasm of bone: Secondary | ICD-10-CM | POA: Diagnosis not present

## 2022-03-14 LAB — RAD ONC ARIA SESSION SUMMARY
Course Elapsed Days: 8
Plan Fractions Treated to Date: 7
Plan Fractions Treated to Date: 7
Plan Fractions Treated to Date: 7
Plan Fractions Treated to Date: 7
Plan Prescribed Dose Per Fraction: 3 Gy
Plan Prescribed Dose Per Fraction: 3 Gy
Plan Prescribed Dose Per Fraction: 3 Gy
Plan Prescribed Dose Per Fraction: 3 Gy
Plan Total Fractions Prescribed: 10
Plan Total Fractions Prescribed: 10
Plan Total Fractions Prescribed: 10
Plan Total Fractions Prescribed: 10
Plan Total Prescribed Dose: 30 Gy
Plan Total Prescribed Dose: 30 Gy
Plan Total Prescribed Dose: 30 Gy
Plan Total Prescribed Dose: 30 Gy
Reference Point Dosage Given to Date: 21 Gy
Reference Point Dosage Given to Date: 21 Gy
Reference Point Dosage Given to Date: 21 Gy
Reference Point Dosage Given to Date: 21 Gy
Reference Point Session Dosage Given: 3 Gy
Reference Point Session Dosage Given: 3 Gy
Reference Point Session Dosage Given: 3 Gy
Reference Point Session Dosage Given: 3 Gy
Session Number: 7

## 2022-03-14 NOTE — Telephone Encounter (Signed)
Transition Care Management Follow-up Telephone Call Date of discharge and from where: 03/08/22 [facility] How have you been since you were released from the hospital? good Any questions or concerns? No  Items Reviewed: Did the pt receive and understand the discharge instructions provided? Yes  Medications obtained and verified? Yes  Other? No  Any new allergies since your discharge? No  Dietary orders reviewed? No Do you have support at home? Yes   Home Care and Equipment/Supplies: Were home health services ordered? no If so, what is the name of the agency? N/A  Has the agency set up a time to come to the patient's home? not applicable Were any new equipment or medical supplies ordered?  No What is the name of the medical supply agency? N/A Were you able to get the supplies/equipment? not applicable Do you have any questions related to the use of the equipment or supplies? No  Functional Questionnaire: (I = Independent and D = Dependent) ADLs: I    Bathing/Dressing- I   Meal Prep- I  Eating- I  Maintaining continence- I  Transferring/Ambulation- I- just a little trouble per patient  Managing Meds- I  Follow up appointments reviewed:  PCP Hospital f/u appt confirmed? Yes  Scheduled to see Dr. Carollee Herter on 03/22/22 @ 340pm. [facility] f/u appt confirmed?  Pt already had visit on 03/05/22 . Are transportation arrangements needed? No  If their condition worsens, is the pt aware to call PCP or go to the Emergency Dept.? Yes Was the patient provided with contact information for the PCP's office or ED? Yes Was to pt encouraged to call back with questions or concerns? Yes

## 2022-03-14 NOTE — Progress Notes (Signed)
Approval received from [facility] for oxycodone 10 mg tablets from 03/14/22-03/14/23.

## 2022-03-19 ENCOUNTER — Other Ambulatory Visit: Payer: Self-pay

## 2022-03-19 ENCOUNTER — Ambulatory Visit
Admission: RE | Admit: 2022-03-19 | Discharge: 2022-03-19 | Disposition: A | Discharge status: Home / Self Care | Payer: BC Managed Care – PPO | Source: Ambulatory Visit | Attending: Radiation Oncology | Admitting: Radiation Oncology

## 2022-03-19 ENCOUNTER — Other Ambulatory Visit: Payer: Self-pay | Admitting: Radiation Oncology

## 2022-03-19 DIAGNOSIS — C7951 Secondary malignant neoplasm of bone: Secondary | ICD-10-CM | POA: Diagnosis not present

## 2022-03-19 DIAGNOSIS — Z510 Encounter for antineoplastic radiation therapy: Secondary | ICD-10-CM | POA: Diagnosis not present

## 2022-03-19 LAB — RAD ONC ARIA SESSION SUMMARY
Course Elapsed Days: 13
Plan Fractions Treated to Date: 8
Plan Fractions Treated to Date: 8
Plan Fractions Treated to Date: 8
Plan Fractions Treated to Date: 8
Plan Prescribed Dose Per Fraction: 3 Gy
Plan Prescribed Dose Per Fraction: 3 Gy
Plan Prescribed Dose Per Fraction: 3 Gy
Plan Prescribed Dose Per Fraction: 3 Gy
Plan Total Fractions Prescribed: 10
Plan Total Fractions Prescribed: 10
Plan Total Fractions Prescribed: 10
Plan Total Fractions Prescribed: 10
Plan Total Prescribed Dose: 30 Gy
Plan Total Prescribed Dose: 30 Gy
Plan Total Prescribed Dose: 30 Gy
Plan Total Prescribed Dose: 30 Gy
Reference Point Dosage Given to Date: 24 Gy
Reference Point Dosage Given to Date: 24 Gy
Reference Point Dosage Given to Date: 24 Gy
Reference Point Dosage Given to Date: 24 Gy
Reference Point Session Dosage Given: 3 Gy
Reference Point Session Dosage Given: 3 Gy
Reference Point Session Dosage Given: 3 Gy
Reference Point Session Dosage Given: 3 Gy
Session Number: 8

## 2022-03-19 MED ORDER — SUCRALFATE 1 G PO TABS: 1.0000 g | ORAL_TABLET | Freq: Three times a day (TID) | ORAL | 2 refills | Status: DC

## 2022-03-19 NOTE — Progress Notes (Unsigned)
Pharmacist Chemotherapy Monitoring - Initial Assessment    Anticipated start date: 03/26/22   The following has been reviewed per standard work regarding the patient's treatment regimen: The patient's diagnosis, treatment plan and drug doses, and organ/hematologic function Lab orders and baseline tests specific to treatment regimen  The treatment plan start date, drug sequencing, and pre-medications Prior authorization status  Patient's documented medication list, including drug-drug interaction screen and prescriptions for anti-emetics and supportive care specific to the treatment regimen The drug concentrations, fluid compatibility, administration routes, and timing of the medications to be used The patient's access for treatment and lifetime cumulative dose history, if applicable  The patient's medication allergies and previous infusion related reactions, if applicable   Changes made to treatment plan:  treatment plan date  Follow up needed:  N/A   Makayla Good, Jacqlyn Larsen, [facility], 03/19/2022  2:18 PM

## 2022-03-20 ENCOUNTER — Ambulatory Visit: Payer: BC Managed Care – PPO

## 2022-03-20 ENCOUNTER — Other Ambulatory Visit: Payer: BC Managed Care – PPO

## 2022-03-20 ENCOUNTER — Ambulatory Visit
Admission: RE | Admit: 2022-03-20 | Discharge: 2022-03-20 | Disposition: A | Discharge status: Home / Self Care | Payer: BC Managed Care – PPO | Source: Ambulatory Visit | Attending: Radiation Oncology | Admitting: Radiation Oncology

## 2022-03-20 ENCOUNTER — Other Ambulatory Visit: Payer: Self-pay

## 2022-03-20 DIAGNOSIS — K8689 Other specified diseases of pancreas: Secondary | ICD-10-CM

## 2022-03-20 DIAGNOSIS — C7951 Secondary malignant neoplasm of bone: Secondary | ICD-10-CM | POA: Diagnosis not present

## 2022-03-20 DIAGNOSIS — Z510 Encounter for antineoplastic radiation therapy: Secondary | ICD-10-CM | POA: Diagnosis not present

## 2022-03-20 LAB — RAD ONC ARIA SESSION SUMMARY
Course Elapsed Days: 14
Plan Fractions Treated to Date: 9
Plan Fractions Treated to Date: 9
Plan Fractions Treated to Date: 9
Plan Fractions Treated to Date: 9
Plan Prescribed Dose Per Fraction: 3 Gy
Plan Prescribed Dose Per Fraction: 3 Gy
Plan Prescribed Dose Per Fraction: 3 Gy
Plan Prescribed Dose Per Fraction: 3 Gy
Plan Total Fractions Prescribed: 10
Plan Total Fractions Prescribed: 10
Plan Total Fractions Prescribed: 10
Plan Total Fractions Prescribed: 10
Plan Total Prescribed Dose: 30 Gy
Plan Total Prescribed Dose: 30 Gy
Plan Total Prescribed Dose: 30 Gy
Plan Total Prescribed Dose: 30 Gy
Reference Point Dosage Given to Date: 27 Gy
Reference Point Dosage Given to Date: 27 Gy
Reference Point Dosage Given to Date: 27 Gy
Reference Point Dosage Given to Date: 27 Gy
Reference Point Session Dosage Given: 3 Gy
Reference Point Session Dosage Given: 3 Gy
Reference Point Session Dosage Given: 3 Gy
Reference Point Session Dosage Given: 3 Gy
Session Number: 9

## 2022-03-21 ENCOUNTER — Encounter: Payer: Self-pay | Admitting: Urology

## 2022-03-21 ENCOUNTER — Other Ambulatory Visit: Payer: Self-pay

## 2022-03-21 ENCOUNTER — Ambulatory Visit: Payer: BC Managed Care – PPO

## 2022-03-21 ENCOUNTER — Ambulatory Visit
Admission: RE | Admit: 2022-03-21 | Discharge: 2022-03-21 | Disposition: A | Discharge status: Home / Self Care | Payer: BC Managed Care – PPO | Source: Ambulatory Visit | Attending: Radiation Oncology | Admitting: Radiation Oncology

## 2022-03-21 DIAGNOSIS — C7951 Secondary malignant neoplasm of bone: Secondary | ICD-10-CM | POA: Diagnosis not present

## 2022-03-21 DIAGNOSIS — C259 Malignant neoplasm of pancreas, unspecified: Secondary | ICD-10-CM | POA: Diagnosis not present

## 2022-03-21 DIAGNOSIS — Z510 Encounter for antineoplastic radiation therapy: Secondary | ICD-10-CM | POA: Diagnosis not present

## 2022-03-21 LAB — RAD ONC ARIA SESSION SUMMARY
Course Elapsed Days: 15
Plan Fractions Treated to Date: 10
Plan Fractions Treated to Date: 10
Plan Fractions Treated to Date: 10
Plan Fractions Treated to Date: 10
Plan Prescribed Dose Per Fraction: 3 Gy
Plan Prescribed Dose Per Fraction: 3 Gy
Plan Prescribed Dose Per Fraction: 3 Gy
Plan Prescribed Dose Per Fraction: 3 Gy
Plan Total Fractions Prescribed: 10
Plan Total Fractions Prescribed: 10
Plan Total Fractions Prescribed: 10
Plan Total Fractions Prescribed: 10
Plan Total Prescribed Dose: 30 Gy
Plan Total Prescribed Dose: 30 Gy
Plan Total Prescribed Dose: 30 Gy
Plan Total Prescribed Dose: 30 Gy
Reference Point Dosage Given to Date: 30 Gy
Reference Point Dosage Given to Date: 30 Gy
Reference Point Dosage Given to Date: 30 Gy
Reference Point Dosage Given to Date: 30 Gy
Reference Point Session Dosage Given: 3 Gy
Reference Point Session Dosage Given: 3 Gy
Reference Point Session Dosage Given: 3 Gy
Reference Point Session Dosage Given: 3 Gy
Session Number: 10

## 2022-03-21 MED ORDER — OXYCODONE HCL 10 MG PO TABS: 10.0000 mg | ORAL_TABLET | Freq: Four times a day (QID) | ORAL | 0 refills | Status: DC | PRN

## 2022-03-22 ENCOUNTER — Ambulatory Visit (INDEPENDENT_AMBULATORY_CARE_PROVIDER_SITE_OTHER): Payer: BC Managed Care – PPO | Admitting: Family Medicine

## 2022-03-22 ENCOUNTER — Encounter: Payer: Self-pay | Admitting: Radiation Oncology

## 2022-03-22 ENCOUNTER — Ambulatory Visit: Payer: BC Managed Care – PPO | Admitting: Dietician

## 2022-03-22 ENCOUNTER — Encounter: Payer: Self-pay | Admitting: Family Medicine

## 2022-03-22 VITALS — BP 112/80 | HR 107 | Temp 98.2°F | Resp 18 | Ht 65.0 in | Wt 184.0 lb

## 2022-03-22 DIAGNOSIS — C259 Malignant neoplasm of pancreas, unspecified: Secondary | ICD-10-CM

## 2022-03-22 NOTE — Progress Notes (Signed)
Attempted to reach patient for a scheduled telephone nutrition consult.  LM on her voice mail and also sent text.    April Manson, RDN, LDN Registered Dietitian, [place] Part Time Remote (Usual office hours: Tuesday-Thursday) Mobile: [phone] Remote Office: [phone]

## 2022-03-22 NOTE — Progress Notes (Signed)
  Radiation Oncology         [phone] ________________________________  Name: Makayla Good MRN: [id number]  Date: 03/22/2022  DOB: 03/06/1971  Chart Note:  I called and spoke with this patient's referring provider, Dr. [name] just now.  I notified him about the radiation treatment to L3 yesterday and the plan to provide an 11th fraction of radiation tomorrow.  As an oncologist, Dr. [name] had a good understanding that this is likely a clinically minor issue and expressed appreciation for being notified.  ________________________________  Sheral Apley Tammi Klippel, M.D.

## 2022-03-22 NOTE — Progress Notes (Signed)
  Radiation Oncology         [phone] ________________________________  Name: Makayla Good MRN: [id number]  Date: 03/22/2022  DOB: 1970-06-12  Chart Note:  I reviewed this patient's most recent findings and wanted to take a minute to document my impression.  This patient completed a course of 10 fractions of radiation yesterday to four separate target areas in the spine.  The final fraction of the treatment to the L3 vertebral body was set up on L4, one bone lower.  This led to some undercoverage of the planned target and unintentional exposure caudad to what was planned.  I just called her and spoke with the patient.  I explained that essentially we treated 95% of what we planned to treat for that one area.  I told her that I would not expect any harm from this error.  I explained that it would be reasonable to stop the course now and follow her clinically versus adding one additional treatment to ensure that L3 got at least the planned dose.  She is interested in receiving an 11th fraction to the L2-L4 plan and will come in tomorrow for that.  I notified our team to help get this planned and scheduled.  ________________________________  Sheral Apley Tammi Klippel, M.D.

## 2022-03-22 NOTE — Progress Notes (Addendum)
Subjective:   By signing my name below, I, [name], attest that this documentation has been prepared under the direction and in the presence of [name], DO. 03/22/2022    Patient ID: Makayla Good, female    DOB: 1970/08/29, 51 y.o.   MRN: [id number]  Chief Complaint  Patient presents with   Hospitalization Follow-up    HPI Patient is in today for a hospital follow up visit.  She has one more radiation treatment due to the final fraction for the treatment to the L4 vertebrae being set up on the L4 by mistake. She is willing to go through one more treatment.  She continues having aching pain in her lower back that goes down her legs. She has tried flexeril but found no relief. She has tried ibuprofen but that stomach issues. She has not tried tylenol yet.  Her MRI of the Lumbar spin without and with contrast on 03/05/2022 showed: 1. Heterogeneous lumbosacral metastatic disease, likely involvement of the right iliac bone also. 2. 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 3. Early tumor infiltration of the right S1 nerve course, in conjunction with L5-S1 posterior element tumor extraosseous extension. 4. Superimposed degenerative moderate to severe spinal stenosis at L4-L5 and foraminal stenosis at the bilateral L5 nerve levels. 5. No cauda equina metastases or lumbar dural metastatic disease identified. She continues taking 4 mg decadron and reports no new issues while taking it.  She does not have a flu vaccine but will consult with her oncologist if she should receive it.    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   Polycystic ovary    Left   Sleep apnea    no CPAP per MD    Past Surgical History:   Procedure Laterality Date   BACK SURGERY     35yr ago   BREAST BIOPSY Right 2019   fibroadenoma   CESAREAN SECTION  02/01/2005   CESAREAN SECTION  2001   IR IMAGING GUIDED PORT INSERTION  03/07/2022   LAPAROSCOPIC GASTRIC SLEEVE RESECTION N/A 07/29/2017   Procedure: LAPAROSCOPIC GASTRIC SLEEVE RESECTION WITH UPPER ENDO ;  Surgeon: CClovis Riley MD;  Location: [facility];  Service: General;  Laterality: N/A;   OOPHORECTOMY     Rt.removed   TUBAL LIGATION  02/01/2005    Family History  Problem Relation Age of Onset   Hypertension Mother    Schizophrenia Mother    Dementia Mother    Mental illness Mother        schizophrenia, dementia   Hypertension Father    Coronary artery disease Father        Stent   Alzheimer's disease Father    Hypertension Sister    Hypertension Sister    Arthritis Sister        rheumatoid   Dementia Maternal Aunt    Kidney disease Other    Colon cancer Neg Hx    Stomach cancer Neg Hx    Pancreatic cancer Neg Hx    Esophageal cancer Neg Hx    Rectal cancer Neg Hx    Breast cancer Neg Hx     Social History   Socioeconomic History   Marital status: Married  Spouse name: Not on file   Number of children: Not on file   Years of education: 13   Highest education level: Not on file  Occupational History   Occupation: release liens    Employer: [employer]  Tobacco Use   Smoking status: Former    Packs/day: 0.50    Years: 30.00    Total pack years: 15.00    Types: Cigarettes, E-cigarettes    Start date: 04/28/2020    Quit date: 2022    Years since quitting: 1.9   Smokeless tobacco: Never   Tobacco comments:    VAPES  Vaping Use   Vaping Use: Every day   Substances: Nicotine  Substance and Sexual Activity   Alcohol use: Yes    Comment: occasional   Drug use: No   Sexual activity: Not Currently    Partners: Male    Birth control/protection: Surgical    Comment: Tubal/been with same person over 20 years  Other Topics Concern    Not on file  Social History Narrative   Exercise--gym, every other day--- at least 3 days a week   Social Determinants of Health   Financial Resource Strain: Low Risk  (02/26/2022)   Overall Financial Resource Strain (CARDIA)    Difficulty of Paying Living Expenses: Not hard at all  Food Insecurity: No Food Insecurity (03/05/2022)   Hunger Vital Sign    Worried About Running Out of Food in the Last Year: Never true    [place] in the Last Year: Never true  Transportation Needs: No Transportation Needs (03/05/2022)   PRAPARE - Hydrologist (Medical): No    Lack of Transportation (Non-Medical): No  Physical Activity: Not on file  Stress: Not on file  Social Connections: Not on file  Intimate Partner Violence: Not At Risk (03/05/2022)   Humiliation, Afraid, Rape, and Kick questionnaire    Fear of Current or Ex-Partner: No    Emotionally Abused: No    Physically Abused: No    Sexually Abused: No    Outpatient Medications Prior to Visit  Medication Sig Dispense Refill   amLODipine (NORVASC) 5 MG tablet TAKE 1 TABLET (5 MG TOTAL) BY MOUTH DAILY. 90 tablet 1   calcium citrate (CALCITRATE - DOSED IN MG ELEMENTAL CALCIUM) 950 (200 Ca) MG tablet Take 200 mg of elemental calcium by mouth daily.     cyclobenzaprine (FLEXERIL) 10 MG tablet TAKE 1 TABLET BY MOUTH AT BEDTIME AS NEEDED FOR MUSCLE SPASMS 30 tablet 0   dexamethasone (DECADRON) 4 MG tablet Take 2 tablets (8 mg total) by mouth every 8 (eight) hours. 180 tablet 0   dexamethasone (DECADRON) 4 MG tablet Take 2 tablets (8 mg total) by mouth daily. Take 2 tablets daily x 3 days starting the day after chemotherapy. Take with food. 30 tablet 1   fluconazole (DIFLUCAN) 100 MG tablet Take 1 tablet (100 mg total) by mouth daily. 30 tablet 0   fluticasone (FLONASE) 50 MCG/ACT nasal spray SPRAY 2 SPRAYS INTO EACH NOSTRIL EVERY DAY (Patient taking differently: Place 2 sprays into both nostrils daily as needed  for allergies.) 48 mL 2   hydrochlorothiazide (HYDRODIURIL) 25 MG tablet Take 1 tablet (25 mg total) by mouth daily. 90 tablet 1   lidocaine-prilocaine (EMLA) cream Apply to affected area once 30 g 3   loperamide (IMODIUM) 2 MG capsule Take 2 tabs by mouth with first loose stool, then 1 tab with each additional loose stool  as needed. Do not exceed 8 tabs in a 24-hour period 60 capsule 5   Melatonin 10 MG TABS Take 10 mg by mouth at bedtime as needed (sleep).     Multiple Vitamins-Minerals (CELEBRATE MULTI-COMPLETE 36 PO) Take 1 each by mouth daily.     Multiple Vitamins-Minerals (HAIR SKIN AND NAILS FORMULA PO) Take 1 tablet by mouth daily.     nicotine polacrilex (NICORETTE) 4 MG gum Take 4 mg by mouth as needed for smoking cessation.     omeprazole (PRILOSEC) 40 MG capsule Take 1 capsule (40 mg total) by mouth daily. (Patient taking differently: Take 40 mg by mouth daily as needed (acid reflux).) 30 capsule 0   ondansetron (ZOFRAN) 8 MG tablet Take 1 tablet (8 mg total) by mouth every 8 (eight) hours as needed for nausea or vomiting. Start on the third day after cisplatin 30 tablet 1   Oxycodone HCl 10 MG TABS Take 1 tablet (10 mg total) by mouth every 6 (six) hours as needed. 120 tablet 0   potassium chloride (KLOR-CON) 10 MEQ tablet TAKE 1 TABLET BY MOUTH EVERY DAY (Patient taking differently: Take 10 mEq by mouth daily.) 90 tablet 1   prochlorperazine (COMPAZINE) 10 MG tablet Take 1 tablet (10 mg total) by mouth every 6 (six) hours as needed for nausea or vomiting (Nausea or vomiting). 30 tablet 1   sucralfate (CARAFATE) 1 g tablet Take 1 tablet (1 g total) by mouth 4 (four) times daily -  with meals and at bedtime. 5 min before meals for radiation induced esophagitis 120 tablet 2   temazepam (RESTORIL) 15 MG capsule Take 1 capsule (15 mg total) by mouth at bedtime as needed for sleep. 30 capsule 0   nicotine (NICODERM CQ - DOSED IN MG/24 HOURS) 14 mg/24hr patch Place 1 patch (14 mg total) onto  the skin daily. (Patient not taking: Reported on 02/19/2022) 28 patch 1   nicotine (NICODERM CQ) 7 mg/24hr patch Place 1 patch (7 mg total) onto the skin daily. (Patient not taking: Reported on 02/19/2022) 28 patch 0   No facility-administered medications prior to visit.    No Known Allergies  Review of Systems  Constitutional:  Negative for fever and malaise/fatigue.  HENT:  Negative for congestion.   Eyes:  Negative for blurred vision.  Respiratory:  Negative for shortness of breath.   Cardiovascular:  Negative for chest pain, palpitations and leg swelling.  Gastrointestinal:  Negative for abdominal pain, blood in stool and nausea.  Genitourinary:  Negative for dysuria and frequency.  Musculoskeletal:  Negative for falls.  Skin:  Negative for rash.  Neurological:  Negative for dizziness, loss of consciousness and headaches.  Endo/Heme/Allergies:  Negative for environmental allergies.  Psychiatric/Behavioral:  Negative for depression. The patient is not nervous/anxious.        Objective:    Physical Exam Vitals and nursing note reviewed.  Constitutional:      General: She is not in acute distress.    Appearance: Normal appearance. She is well-developed. She is not ill-appearing.  HENT:     Head: Normocephalic and atraumatic.     Right Ear: External ear normal.     Left Ear: External ear normal.  Eyes:     Extraocular Movements: Extraocular movements intact.     Conjunctiva/sclera: Conjunctivae normal.     Pupils: Pupils are equal, round, and reactive to light.  Neck:     Thyroid: No thyromegaly.     Vascular: No carotid bruit or JVD.  Cardiovascular:     Rate and Rhythm: Normal rate and regular rhythm.     Heart sounds: Normal heart sounds. No murmur heard.    No gallop.  Pulmonary:     Effort: Pulmonary effort is normal. No respiratory distress.     Breath sounds: Normal breath sounds. No wheezing or rales.  Chest:     Chest wall: No tenderness.  Musculoskeletal:      Cervical back: Normal range of motion and neck supple.  Skin:    General: Skin is warm and dry.  Neurological:     General: No focal deficit present.     Mental Status: She is alert and oriented to person, place, and time.  Psychiatric:        Mood and Affect: Mood normal.        Behavior: Behavior normal.        Thought Content: Thought content normal.        Judgment: Judgment normal.     BP 112/80 (BP Location: Left Arm, Patient Position: Sitting, Cuff Size: Normal)   Pulse (!) 107   Temp 98.2 F (36.8 C) (Oral)   Resp 18   Ht '5\' 5"'$  (1.651 m)   Wt 184 lb (83.5 kg)   LMP 07/27/2020 (Within Days)   SpO2 98%   BMI 30.62 kg/m  Wt Readings from Last 3 Encounters:  03/22/22 184 lb (83.5 kg)  03/05/22 198 lb (89.8 kg)  02/28/22 198 lb (89.8 kg)    Diabetic Foot Exam - Simple   No data filed    Lab Results  Component Value Date   WBC 7.3 03/08/2022   HGB 11.4 (L) 03/08/2022   HCT 35.1 (L) 03/08/2022   PLT 301 03/08/2022   GLUCOSE 155 (H) 03/08/2022   CHOL 186 12/08/2021   TRIG 102 12/08/2021   HDL 54 12/08/2021   LDLCALC 112 (H) 12/08/2021   ALT 31 03/08/2022   AST 37 03/08/2022   NA 137 03/08/2022   K 3.9 03/08/2022   CL 103 03/08/2022   CREATININE 0.57 03/08/2022   BUN 15 03/08/2022   CO2 26 03/08/2022   TSH 1.74 12/08/2021   INR 1.0 02/20/2022   HGBA1C 5.6 05/05/2020   MICROALBUR <0.7 01/23/2016    Lab Results  Component Value Date   TSH 1.74 12/08/2021   Lab Results  Component Value Date   WBC 7.3 03/08/2022   HGB 11.4 (L) 03/08/2022   HCT 35.1 (L) 03/08/2022   MCV 95.6 03/08/2022   PLT 301 03/08/2022   Lab Results  Component Value Date   NA 137 03/08/2022   K 3.9 03/08/2022   CO2 26 03/08/2022   GLUCOSE 155 (H) 03/08/2022   BUN 15 03/08/2022   CREATININE 0.57 03/08/2022   BILITOT 0.5 03/08/2022   ALKPHOS 134 (H) 03/08/2022   AST 37 03/08/2022   ALT 31 03/08/2022   PROT 7.6 03/08/2022   ALBUMIN 3.5 03/08/2022   CALCIUM 8.8 (L)  03/08/2022   ANIONGAP 8 03/08/2022   GFR 102.07 02/05/2022   Lab Results  Component Value Date   CHOL 186 12/08/2021   Lab Results  Component Value Date   HDL 54 12/08/2021   Lab Results  Component Value Date   LDLCALC 112 (H) 12/08/2021   Lab Results  Component Value Date   TRIG 102 12/08/2021   Lab Results  Component Value Date   CHOLHDL 3.4 12/08/2021   Lab Results  Component Value Date   HGBA1C 5.6 05/05/2020  Assessment & Plan:   Problem List Items Addressed This Visit       Unprioritized   Pancreatic cancer ([place]) - Primary    Per hematology / oncology         No orders of the defined types were placed in this encounter.   IAnn Held, DO, personally preformed the services described in this documentation.  All medical record entries made by the scribe were at my direction and in my presence.  I have reviewed the chart and discharge instructions (if applicable) and agree that the record reflects my personal performance and is accurate and complete. 03/22/2022   I,[name],acting as a scribe for [name], DO.,have documented all relevant documentation on the behalf of [name], DO,as directed by  [name], DO while in the presence of [name], DO.   [name], DO

## 2022-03-23 ENCOUNTER — Other Ambulatory Visit: Payer: Self-pay

## 2022-03-23 ENCOUNTER — Ambulatory Visit
Admission: RE | Admit: 2022-03-23 | Discharge: 2022-03-23 | Disposition: A | Discharge status: Home / Self Care | Payer: BC Managed Care – PPO | Source: Ambulatory Visit | Attending: Radiation Oncology | Admitting: Radiation Oncology

## 2022-03-23 DIAGNOSIS — C259 Malignant neoplasm of pancreas, unspecified: Secondary | ICD-10-CM | POA: Diagnosis not present

## 2022-03-23 DIAGNOSIS — C7951 Secondary malignant neoplasm of bone: Secondary | ICD-10-CM | POA: Diagnosis not present

## 2022-03-23 LAB — RAD ONC ARIA SESSION SUMMARY
Course Elapsed Days: 17
Plan Fractions Treated to Date: 1
Plan ID: 4:1 {titer}
Plan Prescribed Dose Per Fraction: 3 Gy
Plan Total Fractions Prescribed: 1
Plan Total Prescribed Dose: 3 Gy
Reference Point Dosage Given to Date: 33 Gy
Reference Point Session Dosage Given: 3 Gy
Session Number: 11

## 2022-03-24 NOTE — Assessment & Plan Note (Signed)
Per hematology / oncology

## 2022-03-26 ENCOUNTER — Other Ambulatory Visit: Payer: Self-pay

## 2022-03-26 ENCOUNTER — Encounter: Payer: Self-pay | Admitting: Hematology & Oncology

## 2022-03-26 ENCOUNTER — Inpatient Hospital Stay: Payer: BC Managed Care – PPO

## 2022-03-26 ENCOUNTER — Inpatient Hospital Stay: Payer: BC Managed Care – PPO | Attending: Hematology & Oncology

## 2022-03-26 ENCOUNTER — Other Ambulatory Visit: Payer: BC Managed Care – PPO

## 2022-03-26 ENCOUNTER — Ambulatory Visit: Payer: BC Managed Care – PPO

## 2022-03-26 ENCOUNTER — Inpatient Hospital Stay (HOSPITAL_BASED_OUTPATIENT_CLINIC_OR_DEPARTMENT_OTHER): Payer: BC Managed Care – PPO | Admitting: Hematology & Oncology

## 2022-03-26 ENCOUNTER — Encounter: Payer: Self-pay | Admitting: *Deleted

## 2022-03-26 VITALS — BP 95/65 | HR 86 | Temp 98.2°F | Resp 18 | Ht 65.0 in | Wt 181.0 lb

## 2022-03-26 DIAGNOSIS — Z79899 Other long term (current) drug therapy: Secondary | ICD-10-CM | POA: Insufficient documentation

## 2022-03-26 DIAGNOSIS — Z5111 Encounter for antineoplastic chemotherapy: Secondary | ICD-10-CM | POA: Insufficient documentation

## 2022-03-26 DIAGNOSIS — C787 Secondary malignant neoplasm of liver and intrahepatic bile duct: Secondary | ICD-10-CM | POA: Diagnosis not present

## 2022-03-26 DIAGNOSIS — C259 Malignant neoplasm of pancreas, unspecified: Secondary | ICD-10-CM

## 2022-03-26 LAB — CBC WITH DIFFERENTIAL (CANCER CENTER ONLY)
Abs Immature Granulocytes: 0.02 10*3/uL (ref 0.00–0.07)
Basophils Absolute: 0 10*3/uL (ref 0.0–0.1)
Basophils Relative: 0 %
Eosinophils Absolute: 0.1 10*3/uL (ref 0.0–0.5)
Eosinophils Relative: 1 %
HCT: 30.6 % — ABNORMAL LOW (ref 36.0–46.0)
Hemoglobin: 10.3 g/dL — ABNORMAL LOW (ref 12.0–15.0)
Immature Granulocytes: 0 %
Lymphocytes Relative: 5 %
Lymphs Abs: 0.2 10*3/uL — ABNORMAL LOW (ref 0.7–4.0)
MCH: 32.5 pg (ref 26.0–34.0)
MCHC: 33.7 g/dL (ref 30.0–36.0)
MCV: 96.5 fL (ref 80.0–100.0)
Monocytes Absolute: 0.3 10*3/uL (ref 0.1–1.0)
Monocytes Relative: 7 %
Neutro Abs: 3.9 10*3/uL (ref 1.7–7.7)
Neutrophils Relative %: 87 %
Platelet Count: 98 10*3/uL — ABNORMAL LOW (ref 150–400)
RBC: 3.17 MIL/uL — ABNORMAL LOW (ref 3.87–5.11)
RDW: 14.4 % (ref 11.5–15.5)
WBC Count: 4.5 10*3/uL (ref 4.0–10.5)
nRBC: 0 % (ref 0.0–0.2)

## 2022-03-26 LAB — CMP (CANCER CENTER ONLY)
ALT: 34 U/L (ref 0–44)
AST: 17 U/L (ref 15–41)
Albumin: 3.7 g/dL (ref 3.5–5.0)
Alkaline Phosphatase: 81 U/L (ref 38–126)
Anion gap: 7 (ref 5–15)
BUN: 16 mg/dL (ref 6–20)
CO2: 29 mmol/L (ref 22–32)
Calcium: 9.1 mg/dL (ref 8.9–10.3)
Chloride: 105 mmol/L (ref 98–111)
Creatinine: 0.53 mg/dL (ref 0.44–1.00)
GFR, Estimated: >60 mL/min (ref >60)
Glucose, Bld: 104 mg/dL — ABNORMAL HIGH (ref 70–99)
Potassium: 3.4 mmol/L — ABNORMAL LOW (ref 3.5–5.1)
Sodium: 141 mmol/L (ref 135–145)
Total Bilirubin: 0.7 mg/dL (ref 0.3–1.2)
Total Protein: 6.5 g/dL (ref 6.5–8.1)

## 2022-03-26 LAB — LACTATE DEHYDROGENASE: LDH: 237 U/L — ABNORMAL HIGH (ref 98–192)

## 2022-03-26 LAB — PREALBUMIN: Prealbumin: 19 mg/dL (ref 18–38)

## 2022-03-26 MED ORDER — HEPARIN SOD (PORK) LOCK FLUSH 100 UNIT/ML IV SOLN
500.0000 [IU] | Freq: Once | INTRAVENOUS | Status: AC
  Administered 2022-03-26: 500 [IU] via INTRAVENOUS

## 2022-03-26 MED ORDER — SODIUM CHLORIDE 0.9% FLUSH
10.0000 mL | INTRAVENOUS | Status: AC | PRN
  Administered 2022-03-26: 10 mL via INTRAVENOUS

## 2022-03-26 NOTE — Progress Notes (Unsigned)
Patient here to start systemic treatment. She completed her radiation on 03/23/22. Dr [name] would like to give her another week off to help recover from radiation before starting chemo.   Oncology Nurse Navigator Documentation     03/26/2022    9:00 AM  Oncology Nurse Navigator Flowsheets  Phase of Treatment Chemo  Chemotherapy Pending- Reason: Oncologist Choice  Radiation Actual End Date: 03/23/2022  Navigator Follow Up Date: 04/02/2022  Navigator Follow Up Reason: Follow-up Appointment;Chemotherapy  Navigator Location [facility]  Navigator Encounter Type Treatment;Appt/Treatment Plan Review  Patient Visit Type MedOnc  Treatment Phase Active Tx  Barriers/Navigation Needs Coordination of Care;Education  Interventions Psycho-Social Support  Acuity Level 2-Minimal Needs (1-2 Barriers Identified)  Support Groups/Services Friends and Family  Time Spent with Patient 15

## 2022-03-26 NOTE — Addendum Note (Signed)
Addended by: Randolm Idol on: 03/26/2022 09:28 AM   Modules accepted: Orders

## 2022-03-26 NOTE — Progress Notes (Signed)
Hematology and Oncology Follow Up Visit  Stevi Hollinshead [id number] [birth year] 51 y.o. 03/26/2022   Principle Diagnosis:  Metastatic adenocarcinoma of the pancreas-liver metastasis -- NO actionable mutation  Current Therapy:   FOLFIRINOX -start cycle 1 on 04/02/2022     Interim History:  Ms. Cortez is back for follow-up.  She finally completed radiation therapy to her spine on Friday.  I still think she is able to take any treatment today.  Her blood counts are little on the lower side.  She does feel little tired.  She has had some odynophagia.  She does not have any mucositis when look at her oral cavity.  She is taking some liquid medication to try to help with her odynophagia.  Otherwise, she seems to be doing pretty well.  She is having no problems with bowels or bladder.  She tries to take as little pain medication as possible because of the constipation.  She has had no bleeding.  She has had no fever.  She has had no cough or shortness of breath.  Overall, I would have said that her performance status is probably ECOG 1.   Medications:  Current Outpatient Medications:    oxyCODONE (OXY IR/ROXICODONE) 5 MG immediate release tablet, Take 5 mg by mouth every 6 (six) hours as needed., Disp: , Rfl:    amLODipine (NORVASC) 5 MG tablet, TAKE 1 TABLET (5 MG TOTAL) BY MOUTH DAILY., Disp: 90 tablet, Rfl: 1   calcium citrate (CALCITRATE - DOSED IN MG ELEMENTAL CALCIUM) 950 (200 Ca) MG tablet, Take 200 mg of elemental calcium by mouth daily., Disp: , Rfl:    cyclobenzaprine (FLEXERIL) 10 MG tablet, TAKE 1 TABLET BY MOUTH AT BEDTIME AS NEEDED FOR MUSCLE SPASMS, Disp: 30 tablet, Rfl: 0   dexamethasone (DECADRON) 4 MG tablet, Take 2 tablets (8 mg total) by mouth every 8 (eight) hours., Disp: 180 tablet, Rfl: 0   dexamethasone (DECADRON) 4 MG tablet, Take 2 tablets (8 mg total) by mouth daily. Take 2 tablets daily x 3 days starting the day after chemotherapy. Take with food., Disp:  30 tablet, Rfl: 1   fluconazole (DIFLUCAN) 100 MG tablet, Take 1 tablet (100 mg total) by mouth daily., Disp: 30 tablet, Rfl: 0   fluticasone (FLONASE) 50 MCG/ACT nasal spray, SPRAY 2 SPRAYS INTO EACH NOSTRIL EVERY DAY (Patient taking differently: Place 2 sprays into both nostrils daily as needed for allergies.), Disp: 48 mL, Rfl: 2   hydrochlorothiazide (HYDRODIURIL) 25 MG tablet, Take 1 tablet (25 mg total) by mouth daily., Disp: 90 tablet, Rfl: 1   lidocaine-prilocaine (EMLA) cream, Apply to affected area once, Disp: 30 g, Rfl: 3   loperamide (IMODIUM) 2 MG capsule, Take 2 tabs by mouth with first loose stool, then 1 tab with each additional loose stool as needed. Do not exceed 8 tabs in a 24-hour period, Disp: 60 capsule, Rfl: 5   Melatonin 10 MG TABS, Take 10 mg by mouth at bedtime as needed (sleep)., Disp: , Rfl:    Multiple Vitamins-Minerals (CELEBRATE MULTI-COMPLETE 36 PO), Take 1 each by mouth daily., Disp: , Rfl:    Multiple Vitamins-Minerals (HAIR SKIN AND NAILS FORMULA PO), Take 1 tablet by mouth daily., Disp: , Rfl:    nicotine (NICODERM CQ - DOSED IN MG/24 HOURS) 14 mg/24hr patch, Place 1 patch (14 mg total) onto the skin daily. (Patient not taking: Reported on 02/19/2022), Disp: 28 patch, Rfl: 1   nicotine (NICODERM CQ) 7 mg/24hr patch, Place 1 patch (  7 mg total) onto the skin daily. (Patient not taking: Reported on 02/19/2022), Disp: 28 patch, Rfl: 0   nicotine polacrilex (NICORETTE) 4 MG gum, Take 4 mg by mouth as needed for smoking cessation., Disp: , Rfl:    omeprazole (PRILOSEC) 40 MG capsule, Take 1 capsule (40 mg total) by mouth daily. (Patient taking differently: Take 40 mg by mouth daily as needed (acid reflux).), Disp: 30 capsule, Rfl: 0   ondansetron (ZOFRAN) 8 MG tablet, Take 1 tablet (8 mg total) by mouth every 8 (eight) hours as needed for nausea or vomiting. Start on the third day after cisplatin, Disp: 30 tablet, Rfl: 1   Oxycodone HCl 10 MG TABS, Take 1 tablet (10 mg  total) by mouth every 6 (six) hours as needed., Disp: 120 tablet, Rfl: 0   potassium chloride (KLOR-CON) 10 MEQ tablet, TAKE 1 TABLET BY MOUTH EVERY DAY (Patient taking differently: Take 10 mEq by mouth daily.), Disp: 90 tablet, Rfl: 1   prochlorperazine (COMPAZINE) 10 MG tablet, Take 1 tablet (10 mg total) by mouth every 6 (six) hours as needed for nausea or vomiting (Nausea or vomiting)., Disp: 30 tablet, Rfl: 1   sucralfate (CARAFATE) 1 g tablet, Take 1 tablet (1 g total) by mouth 4 (four) times daily -  with meals and at bedtime. 5 min before meals for radiation induced esophagitis, Disp: 120 tablet, Rfl: 2   temazepam (RESTORIL) 15 MG capsule, Take 1 capsule (15 mg total) by mouth at bedtime as needed for sleep., Disp: 30 capsule, Rfl: 0  Allergies: No Known Allergies  Past Medical History, Surgical history, Social history, and Family History were reviewed and updated.  Review of Systems: Review of Systems  Constitutional: Negative.   HENT:  Negative.    Eyes: Negative.   Respiratory: Negative.    Cardiovascular: Negative.   Gastrointestinal:  Positive for abdominal pain and nausea.  Endocrine: Negative.   Genitourinary: Negative.    Musculoskeletal: Negative.   Skin: Negative.   Neurological: Negative.   Hematological: Negative.   Psychiatric/Behavioral: Negative.      Physical Exam:  height is '5\' 5"'$  (1.651 m) and weight is 181 lb (82.1 kg). Her oral temperature is 98.2 F (36.8 C). Her blood pressure is 95/65 and her pulse is 86. Her respiration is 18 and oxygen saturation is 100%.   Wt Readings from Last 3 Encounters:  03/26/22 181 lb (82.1 kg)  03/22/22 184 lb (83.5 kg)  03/05/22 198 lb (89.8 kg)    Physical Exam Vitals reviewed.  HENT:     Head: Normocephalic and atraumatic.  Eyes:     Pupils: Pupils are equal, round, and reactive to light.  Cardiovascular:     Rate and Rhythm: Normal rate and regular rhythm.     Heart sounds: Normal heart sounds.  Pulmonary:      Effort: Pulmonary effort is normal.     Breath sounds: Normal breath sounds.  Abdominal:     General: Bowel sounds are normal.     Palpations: Abdomen is soft.  Musculoskeletal:        General: No tenderness or deformity. Normal range of motion.     Cervical back: Normal range of motion.  Lymphadenopathy:     Cervical: No cervical adenopathy.  Skin:    General: Skin is warm and dry.     Findings: No erythema or rash.  Neurological:     Mental Status: She is alert and oriented to person, place, and time.  Psychiatric:  Behavior: Behavior normal.        Thought Content: Thought content normal.        Judgment: Judgment normal.     Lab Results  Component Value Date   WBC 4.5 03/26/2022   HGB 10.3 (L) 03/26/2022   HCT 30.6 (L) 03/26/2022   MCV 96.5 03/26/2022   PLT 98 (L) 03/26/2022     Chemistry      Component Value Date/Time   NA 141 03/26/2022 0802   K 3.4 (L) 03/26/2022 0802   CL 105 03/26/2022 0802   CO2 29 03/26/2022 0802   BUN 16 03/26/2022 0802   CREATININE 0.53 03/26/2022 0802   CREATININE 0.79 12/08/2021 1420      Component Value Date/Time   CALCIUM 9.1 03/26/2022 0802   ALKPHOS 81 03/26/2022 0802   AST 17 03/26/2022 0802   ALT 34 03/26/2022 0802   BILITOT 0.7 03/26/2022 0802      Impression and Plan: Ms. Robley is a very nice 51 year old African-American female.  She has metastatic pancreatic cancer.  This is an adenocarcinoma.  Surprisingly, her initial CA 19 I have not was not all that elevated at 113.  Unfortunately, there is no mutation that we can target.  I am not surprised given that this is pancreatic cancer.  Again we will give her a week off.  I think she needs at least a week before we start treatment on her.  Hopefully, her blood counts will be a little bit better.  Hopefully her swallowing will be a little bit easier.  I will see her back in 1 week.    Volanda Napoleon, MD 12/4/20239:01 AM

## 2022-03-26 NOTE — Patient Instructions (Signed)

## 2022-03-27 ENCOUNTER — Other Ambulatory Visit: Payer: Self-pay | Admitting: Family Medicine

## 2022-03-27 ENCOUNTER — Encounter: Payer: Self-pay | Admitting: Hematology & Oncology

## 2022-03-27 DIAGNOSIS — I10 Essential (primary) hypertension: Secondary | ICD-10-CM

## 2022-03-27 NOTE — Progress Notes (Signed)
Pharmacist Chemotherapy Monitoring - Initial Assessment    Anticipated start date: 04/02/22   The following has been reviewed per standard work regarding the patient's treatment regimen: The patient's diagnosis, treatment plan and drug doses, and organ/hematologic function Lab orders and baseline tests specific to treatment regimen  The treatment plan start date, drug sequencing, and pre-medications Prior authorization status  Patient's documented medication list, including drug-drug interaction screen and prescriptions for anti-emetics and supportive care specific to the treatment regimen The drug concentrations, fluid compatibility, administration routes, and timing of the medications to be used The patient's access for treatment and lifetime cumulative dose history, if applicable  The patient's medication allergies and previous infusion related reactions, if applicable   Changes made to treatment plan:  N/A  Follow up needed:  N/A   Claybon Jabs, [facility], 03/27/2022  3:38 PM

## 2022-03-28 LAB — CANCER ANTIGEN 19-9: CA 19-9: 199 U/mL — ABNORMAL HIGH (ref 0–35)

## 2022-03-29 ENCOUNTER — Encounter: Payer: Self-pay | Admitting: Genetic Counselor

## 2022-03-29 ENCOUNTER — Telehealth: Payer: Self-pay | Admitting: Genetic Counselor

## 2022-03-29 DIAGNOSIS — Z1379 Encounter for other screening for genetic and chromosomal anomalies: Secondary | ICD-10-CM | POA: Insufficient documentation

## 2022-03-29 NOTE — Telephone Encounter (Signed)
Revealed negative genetic testing.  Discussed that her personal history of pancreatic cancer could be sporadic/familial, due to a different gene that we are not testing, or maybe our current technology may not be able to pick something up.  It will be important for her to keep in contact with genetics to keep up with whether additional testing may be needed.    Per patient, no known family history of cancer.  Family members should notify their providers about the family history of pancreatic cancer.

## 2022-03-30 ENCOUNTER — Encounter: Payer: Self-pay | Admitting: Hematology & Oncology

## 2022-03-30 NOTE — Progress Notes (Addendum)
                                                                                                                                                             Patient Name: Makayla Good MRN: [id number] DOB: May 12, 1970 Referring Physician: Elgie Congo Date of Service: 03/21/2022  [facility],                                                         End Of Treatment Note  Diagnoses: C79.51-Secondary malignant neoplasm of bone  Cancer Staging: 51 yo woman with multi-level spinal cord compression from newly diagnosed stage IV pancreatic cancer.   Intent: Curative  Radiation Treatment Dates: 03/06/2022 through 03/23/2022 Site Technique Total Dose (Gy) Dose per Fx (Gy) Completed Fx Beam Energies  Cervical Spine: Spine_C3-C6 3D 30/30 3 10/10 6X, 10X  Thoracic Spine: Spine_T1-T5 3D 30/30 3 10/10 10X  Lumbar Spine: Spine_L2-L4 3D 33/33 3 11/11 10X, 15X  Thoracic Spine: Spine_T8-T11 3D 30/30 3 10/10 10X, 15X   Narrative: The patient tolerated radiation therapy relatively well without any ill side effects.  Plan: The patient will receive a call in about one month from the radiation oncology department. She will continue follow up with Dr. [name] as well.   Nicholos Johns, PA-C    Tyler Pita, MD  [facility] Direct Dial: [phone]  Fax: [phone] [email or web address]  Skype  LinkedIn

## 2022-04-01 ENCOUNTER — Other Ambulatory Visit: Payer: Self-pay | Admitting: Physical Medicine and Rehabilitation

## 2022-04-02 ENCOUNTER — Inpatient Hospital Stay: Payer: BC Managed Care – PPO

## 2022-04-02 ENCOUNTER — Other Ambulatory Visit: Payer: Self-pay

## 2022-04-02 ENCOUNTER — Encounter: Payer: Self-pay | Admitting: Hematology & Oncology

## 2022-04-02 ENCOUNTER — Inpatient Hospital Stay (HOSPITAL_BASED_OUTPATIENT_CLINIC_OR_DEPARTMENT_OTHER): Payer: BC Managed Care – PPO | Admitting: Hematology & Oncology

## 2022-04-02 VITALS — BP 121/80 | HR 114 | Temp 99.0°F | Resp 18 | Ht 65.0 in | Wt 181.2 lb

## 2022-04-02 VITALS — HR 105 | Wt 183.4 lb

## 2022-04-02 DIAGNOSIS — C7951 Secondary malignant neoplasm of bone: Secondary | ICD-10-CM

## 2022-04-02 DIAGNOSIS — C259 Malignant neoplasm of pancreas, unspecified: Secondary | ICD-10-CM | POA: Diagnosis not present

## 2022-04-02 DIAGNOSIS — Z79899 Other long term (current) drug therapy: Secondary | ICD-10-CM | POA: Diagnosis not present

## 2022-04-02 DIAGNOSIS — Z5111 Encounter for antineoplastic chemotherapy: Secondary | ICD-10-CM | POA: Diagnosis not present

## 2022-04-02 DIAGNOSIS — C787 Secondary malignant neoplasm of liver and intrahepatic bile duct: Secondary | ICD-10-CM | POA: Diagnosis not present

## 2022-04-02 LAB — CBC WITH DIFFERENTIAL (CANCER CENTER ONLY)
Abs Immature Granulocytes: 0.02 10*3/uL (ref 0.00–0.07)
Basophils Absolute: 0 10*3/uL (ref 0.0–0.1)
Basophils Relative: 1 %
Eosinophils Absolute: 0.1 10*3/uL (ref 0.0–0.5)
Eosinophils Relative: 4 %
HCT: 29.1 % — ABNORMAL LOW (ref 36.0–46.0)
Hemoglobin: 9.6 g/dL — ABNORMAL LOW (ref 12.0–15.0)
Immature Granulocytes: 1 %
Lymphocytes Relative: 11 %
Lymphs Abs: 0.3 10*3/uL — ABNORMAL LOW (ref 0.7–4.0)
MCH: 31.8 pg (ref 26.0–34.0)
MCHC: 33 g/dL (ref 30.0–36.0)
MCV: 96.4 fL (ref 80.0–100.0)
Monocytes Absolute: 0.3 10*3/uL (ref 0.1–1.0)
Monocytes Relative: 11 %
Neutro Abs: 2 10*3/uL (ref 1.7–7.7)
Neutrophils Relative %: 72 %
Platelet Count: 156 10*3/uL (ref 150–400)
RBC: 3.02 MIL/uL — ABNORMAL LOW (ref 3.87–5.11)
RDW: 14.7 % (ref 11.5–15.5)
WBC Count: 2.7 10*3/uL — ABNORMAL LOW (ref 4.0–10.5)
nRBC: 0 % (ref 0.0–0.2)

## 2022-04-02 LAB — CMP (CANCER CENTER ONLY)
ALT: 22 U/L (ref 0–44)
AST: 21 U/L (ref 15–41)
Albumin: 3.6 g/dL (ref 3.5–5.0)
Alkaline Phosphatase: 112 U/L (ref 38–126)
Anion gap: 9 (ref 5–15)
BUN: 11 mg/dL (ref 6–20)
CO2: 28 mmol/L (ref 22–32)
Calcium: 9 mg/dL (ref 8.9–10.3)
Chloride: 104 mmol/L (ref 98–111)
Creatinine: 0.49 mg/dL (ref 0.44–1.00)
GFR, Estimated: >60 mL/min (ref >60)
Glucose, Bld: 105 mg/dL — ABNORMAL HIGH (ref 70–99)
Potassium: 2.9 mmol/L — ABNORMAL LOW (ref 3.5–5.1)
Sodium: 141 mmol/L (ref 135–145)
Total Bilirubin: 0.8 mg/dL (ref 0.3–1.2)
Total Protein: 6.4 g/dL — ABNORMAL LOW (ref 6.5–8.1)

## 2022-04-02 LAB — LACTATE DEHYDROGENASE: LDH: 291 U/L — ABNORMAL HIGH (ref 98–192)

## 2022-04-02 MED ORDER — OXALIPLATIN CHEMO INJECTION 100 MG/20ML
85.0000 mg/m2 | Freq: Once | INTRAVENOUS | Status: AC
  Administered 2022-04-02: 175 mg via INTRAVENOUS
  Filled 2022-04-02: qty 35

## 2022-04-02 MED ORDER — SODIUM CHLORIDE 0.9 % IV SOLN
10.0000 mg | Freq: Once | INTRAVENOUS | Status: AC
  Administered 2022-04-02: 10 mg via INTRAVENOUS
  Filled 2022-04-02: qty 10

## 2022-04-02 MED ORDER — SODIUM CHLORIDE 0.9 % IV SOLN
150.0000 mg/m2 | Freq: Once | INTRAVENOUS | Status: AC
  Administered 2022-04-02: 300 mg via INTRAVENOUS
  Filled 2022-04-02: qty 15

## 2022-04-02 MED ORDER — SODIUM CHLORIDE 0.9 % IV SOLN
150.0000 mg | Freq: Once | INTRAVENOUS | Status: AC
  Administered 2022-04-02: 150 mg via INTRAVENOUS
  Filled 2022-04-02: qty 150

## 2022-04-02 MED ORDER — KETOROLAC TROMETHAMINE 15 MG/ML IJ SOLN
30.0000 mg | Freq: Once | INTRAMUSCULAR | Status: AC
  Administered 2022-04-02: 30 mg via INTRAVENOUS
  Filled 2022-04-02: qty 2

## 2022-04-02 MED ORDER — POTASSIUM CHLORIDE CRYS ER 20 MEQ PO TBCR
40.0000 meq | EXTENDED_RELEASE_TABLET | Freq: Two times a day (BID) | ORAL | Status: DC
  Administered 2022-04-02 (×2): 40 meq via ORAL
  Filled 2022-04-02: qty 2

## 2022-04-02 MED ORDER — SODIUM CHLORIDE 0.9 % IV SOLN
400.0000 mg/m2 | Freq: Once | INTRAVENOUS | Status: AC
  Administered 2022-04-02: 812 mg via INTRAVENOUS
  Filled 2022-04-02: qty 40.6

## 2022-04-02 MED ORDER — SODIUM CHLORIDE 0.9 % IV SOLN
5000.0000 mg | INTRAVENOUS | Status: DC
  Administered 2022-04-02: 5000 mg via INTRAVENOUS
  Filled 2022-04-02: qty 100

## 2022-04-02 MED ORDER — CIPROFLOXACIN HCL 500 MG PO TABS: 500.0000 mg | ORAL_TABLET | Freq: Two times a day (BID) | ORAL | 5 refills | Status: DC

## 2022-04-02 MED ORDER — KETOROLAC TROMETHAMINE 15 MG/ML IJ SOLN: 30.0000 mg | Freq: Once | INTRAMUSCULAR | Status: DC

## 2022-04-02 MED ORDER — PALONOSETRON HCL INJECTION 0.25 MG/5ML
0.2500 mg | Freq: Once | INTRAVENOUS | Status: AC
  Administered 2022-04-02: 0.25 mg via INTRAVENOUS
  Filled 2022-04-02: qty 5

## 2022-04-02 MED ORDER — DEXTROSE 5 % IV SOLN: Freq: Once | INTRAVENOUS | Status: AC

## 2022-04-02 NOTE — Progress Notes (Signed)
Hematology and Oncology Follow Up Visit  Makayla Good [id number] 1970/06/19 51 y.o. 04/02/2022   Principle Diagnosis:  Metastatic adenocarcinoma of the pancreas-liver metastasis -- NO actionable mutation  Current Therapy:   FOLFIRINOX -start cycle 1 on 04/02/2022 XRT to spine -- completed on 03/23/2022     Interim History:  Makayla Good is back for follow-up.  She is doing pretty well.  She was had no specific complaints.  She does have some pain in her hips and thighs.  This could be residual from the radiation therapy that she took.  She has had no problems with cough or shortness of breath.  She has had no nausea or vomiting.  There is been no bleeding.  She has little bit of constipation.  She has had no problems with her urine.  She had a little bit of leg swelling.  She is able to swallow better.  She had little bit of odynophagia when she took the radiation therapy.  Currently, I would say performance status is ECOG 1.   Medications:  Current Outpatient Medications:    amLODipine (NORVASC) 5 MG tablet, TAKE 1 TABLET (5 MG TOTAL) BY MOUTH DAILY., Disp: 90 tablet, Rfl: 1   calcium citrate (CALCITRATE - DOSED IN MG ELEMENTAL CALCIUM) 950 (200 Ca) MG tablet, Take 200 mg of elemental calcium by mouth daily., Disp: , Rfl:    cyclobenzaprine (FLEXERIL) 10 MG tablet, TAKE 1 TABLET BY MOUTH AT BEDTIME AS NEEDED FOR MUSCLE SPASMS, Disp: 30 tablet, Rfl: 0   dexamethasone (DECADRON) 4 MG tablet, Take 2 tablets (8 mg total) by mouth every 8 (eight) hours., Disp: 180 tablet, Rfl: 0   fluticasone (FLONASE) 50 MCG/ACT nasal spray, SPRAY 2 SPRAYS INTO EACH NOSTRIL EVERY DAY (Patient taking differently: Place 2 sprays into both nostrils daily as needed for allergies.), Disp: 48 mL, Rfl: 2   hydrochlorothiazide (HYDRODIURIL) 25 MG tablet, Take 1 tablet (25 mg total) by mouth daily., Disp: 90 tablet, Rfl: 1   lidocaine-prilocaine (EMLA) cream, Apply to affected area once, Disp: 30  g, Rfl: 3   Melatonin 10 MG TABS, Take 10 mg by mouth at bedtime as needed (sleep)., Disp: , Rfl:    Multiple Vitamins-Minerals (CELEBRATE MULTI-COMPLETE 36 PO), Take 1 each by mouth daily., Disp: , Rfl:    omeprazole (PRILOSEC) 40 MG capsule, Take 1 capsule (40 mg total) by mouth daily. (Patient taking differently: Take 40 mg by mouth daily as needed (acid reflux).), Disp: 30 capsule, Rfl: 0   ondansetron (ZOFRAN) 8 MG tablet, Take 1 tablet (8 mg total) by mouth every 8 (eight) hours as needed for nausea or vomiting. Start on the third day after cisplatin, Disp: 30 tablet, Rfl: 1   Oxycodone HCl 10 MG TABS, Take 1 tablet (10 mg total) by mouth every 6 (six) hours as needed., Disp: 120 tablet, Rfl: 0   potassium chloride (KLOR-CON) 10 MEQ tablet, TAKE 1 TABLET BY MOUTH EVERY DAY, Disp: 90 tablet, Rfl: 1   prochlorperazine (COMPAZINE) 10 MG tablet, Take 1 tablet (10 mg total) by mouth every 6 (six) hours as needed for nausea or vomiting (Nausea or vomiting)., Disp: 30 tablet, Rfl: 1   temazepam (RESTORIL) 15 MG capsule, Take 1 capsule (15 mg total) by mouth at bedtime as needed for sleep., Disp: 30 capsule, Rfl: 0   dexamethasone (DECADRON) 4 MG tablet, Take 2 tablets (8 mg total) by mouth daily. Take 2 tablets daily x 3 days starting the day after chemotherapy. Take  with food. (Patient not taking: Reported on 04/02/2022), Disp: 30 tablet, Rfl: 1   loperamide (IMODIUM) 2 MG capsule, Take 2 tabs by mouth with first loose stool, then 1 tab with each additional loose stool as needed. Do not exceed 8 tabs in a 24-hour period (Patient not taking: Reported on 04/02/2022), Disp: 60 capsule, Rfl: 5 No current facility-administered medications for this visit.  Facility-Administered Medications Ordered in Other Visits:    sodium chloride flush (NS) 0.9 % injection 10 mL, 10 mL, Intravenous, PRN, Volanda Napoleon, MD, 10 mL at 03/26/22 2992  Allergies: No Known Allergies  Past Medical History, Surgical  history, Social history, and Family History were reviewed and updated.  Review of Systems: Review of Systems  Constitutional: Negative.   HENT:  Negative.    Eyes: Negative.   Respiratory: Negative.    Cardiovascular: Negative.   Gastrointestinal:  Positive for abdominal pain and nausea.  Endocrine: Negative.   Genitourinary: Negative.    Musculoskeletal: Negative.   Skin: Negative.   Neurological: Negative.   Hematological: Negative.   Psychiatric/Behavioral: Negative.      Physical Exam:  height is '5\' 5"'$  (1.651 m) and weight is 181 lb 3.2 oz (82.2 kg). Her oral temperature is 99 F (37.2 C). Her blood pressure is 121/80 and her pulse is 114 (abnormal). Her respiration is 18 and oxygen saturation is 100%.   Wt Readings from Last 3 Encounters:  04/02/22 181 lb 3.2 oz (82.2 kg)  03/26/22 181 lb (82.1 kg)  03/22/22 184 lb (83.5 kg)    Physical Exam Vitals reviewed.  HENT:     Head: Normocephalic and atraumatic.  Eyes:     Pupils: Pupils are equal, round, and reactive to light.  Cardiovascular:     Rate and Rhythm: Normal rate and regular rhythm.     Heart sounds: Normal heart sounds.  Pulmonary:     Effort: Pulmonary effort is normal.     Breath sounds: Normal breath sounds.  Abdominal:     General: Bowel sounds are normal.     Palpations: Abdomen is soft.  Musculoskeletal:        General: No tenderness or deformity. Normal range of motion.     Cervical back: Normal range of motion.  Lymphadenopathy:     Cervical: No cervical adenopathy.  Skin:    General: Skin is warm and dry.     Findings: No erythema or rash.  Neurological:     Mental Status: She is alert and oriented to person, place, and time.  Psychiatric:        Behavior: Behavior normal.        Thought Content: Thought content normal.        Judgment: Judgment normal.     Lab Results  Component Value Date   WBC 2.7 (L) 04/02/2022   HGB 9.6 (L) 04/02/2022   HCT 29.1 (L) 04/02/2022   MCV 96.4  04/02/2022   PLT 156 04/02/2022     Chemistry      Component Value Date/Time   NA 141 03/26/2022 0802   K 3.4 (L) 03/26/2022 0802   CL 105 03/26/2022 0802   CO2 29 03/26/2022 0802   BUN 16 03/26/2022 0802   CREATININE 0.53 03/26/2022 0802   CREATININE 0.79 12/08/2021 1420      Component Value Date/Time   CALCIUM 9.1 03/26/2022 0802   ALKPHOS 81 03/26/2022 0802   AST 17 03/26/2022 0802   ALT 34 03/26/2022 0802   BILITOT 0.7  03/26/2022 0802      Impression and Plan: Ms. Meara is a very nice 51 year old African-American female.  She has metastatic pancreatic cancer.  This is an adenocarcinoma.  Surprisingly, her initial CA 19-9 was not all that elevated at 113.  Unfortunately, there is no mutation that we can target.  I am not surprised given that this is pancreatic cancer.  We will now proceed with her chemotherapy.  Hopefully, we will see that she has a good response.  Her white cell count is little bit on the low side.  I think we had to give her Neulasta after this cycle of chemotherapy.  I am sure that her white cells are low because of the radiation.  Also would like to put her on some prophylactic antibiotics just to be on the safe side when she does become neutropenic.  I will give her a dose of Toradol today.  She is having some arthralgias.  We will plan to get her back in 2 more weeks for her second cycle of treatment.     Volanda Napoleon, MD 12/11/20238:59 AM

## 2022-04-02 NOTE — Progress Notes (Signed)
OK to treat with K-2.9 today per order of Dr. [name].  Order received for pt to take potassium 40 meq po now and potassium 40 meq po prior to discharge today.

## 2022-04-02 NOTE — Progress Notes (Signed)
OK to treat with HR-105 per order of Dr. [name].

## 2022-04-02 NOTE — Patient Instructions (Signed)
[facility]  Discharge Instructions: Thank you for choosing [facility] to provide your oncology and hematology care.   If you have a lab appointment with the [facility], please go directly to the [facility] and check in at the registration area.  Wear comfortable clothing and clothing appropriate for easy access to any Portacath or PICC line.   We strive to give you quality time with your provider. You may need to reschedule your appointment if you arrive late (15 or more minutes).  Arriving late affects you and other patients whose appointments are after yours.  Also, if you miss three or more appointments without notifying the office, you may be dismissed from the clinic at the provider's discretion.      For prescription refill requests, have your pharmacy contact our office and allow 72 hours for refills to be completed.    Today you received the following chemotherapy and/or immunotherapy agents:  Oxaliplatin, Leucovorin, Irinotecan and 5FU.       To help prevent nausea and vomiting after your treatment, we encourage you to take your nausea medication as directed.  BELOW ARE SYMPTOMS THAT SHOULD BE REPORTED IMMEDIATELY: *FEVER GREATER THAN 100.4 F (38 C) OR HIGHER *CHILLS OR SWEATING *NAUSEA AND VOMITING THAT IS NOT CONTROLLED WITH YOUR NAUSEA MEDICATION *UNUSUAL SHORTNESS OF BREATH *UNUSUAL BRUISING OR BLEEDING *URINARY PROBLEMS (pain or burning when urinating, or frequent urination) *BOWEL PROBLEMS (unusual diarrhea, constipation, pain near the anus) TENDERNESS IN MOUTH AND THROAT WITH OR WITHOUT PRESENCE OF ULCERS (sore throat, sores in mouth, or a toothache) UNUSUAL RASH, SWELLING OR PAIN  UNUSUAL VAGINAL DISCHARGE OR ITCHING   Items with * indicate a potential emergency and should be followed up as soon as possible or go to the Emergency Department if any problems should occur.  Please show the CHEMOTHERAPY ALERT CARD or  IMMUNOTHERAPY ALERT CARD at check-in to the Emergency Department and triage nurse. Should you have questions after your visit or need to cancel or reschedule your appointment, please contact [facility]  [phone] and follow the prompts.  Office hours are 8:00 a.m. to 4:30 p.m. Monday - Friday. Please note that voicemails left after 4:00 p.m. may not be returned until the following business day.  We are closed weekends and major holidays. You have access to a nurse at all times for urgent questions. Please call the main number to the clinic [phone] and follow the prompts.  For any non-urgent questions, you may also contact your provider using MyChart. We now offer e-Visits for anyone 79 and older to request care online for non-urgent symptoms. For details visit [email or web address].   Also download the MyChart app! Go to the app store, search "MyChart", open the app, select [facility], and log in with your MyChart username and password.  Masks are optional in the cancer centers. If you would like for your care team to wear a mask while they are taking care of you, please let them know. You may have one support person who is at least 51 years old accompany you for your appointments.

## 2022-04-03 LAB — CANCER ANTIGEN 19-9: CA 19-9: 216 U/mL — ABNORMAL HIGH (ref 0–35)

## 2022-04-04 ENCOUNTER — Inpatient Hospital Stay: Payer: BC Managed Care – PPO

## 2022-04-04 ENCOUNTER — Encounter: Payer: Self-pay | Admitting: *Deleted

## 2022-04-04 VITALS — BP 101/65 | HR 85 | Temp 98.1°F | Resp 18

## 2022-04-04 DIAGNOSIS — Z5111 Encounter for antineoplastic chemotherapy: Secondary | ICD-10-CM | POA: Diagnosis not present

## 2022-04-04 DIAGNOSIS — C259 Malignant neoplasm of pancreas, unspecified: Secondary | ICD-10-CM | POA: Diagnosis not present

## 2022-04-04 DIAGNOSIS — C787 Secondary malignant neoplasm of liver and intrahepatic bile duct: Secondary | ICD-10-CM | POA: Diagnosis not present

## 2022-04-04 DIAGNOSIS — Z79899 Other long term (current) drug therapy: Secondary | ICD-10-CM | POA: Diagnosis not present

## 2022-04-04 MED ORDER — HEPARIN SOD (PORK) LOCK FLUSH 100 UNIT/ML IV SOLN
500.0000 [IU] | Freq: Once | INTRAVENOUS | Status: AC | PRN
  Administered 2022-04-04: 500 [IU]

## 2022-04-04 MED ORDER — SODIUM CHLORIDE 0.9% FLUSH
10.0000 mL | INTRAVENOUS | Status: DC | PRN
  Administered 2022-04-04: 10 mL

## 2022-04-04 MED ORDER — PEGFILGRASTIM-CBQV 6 MG/0.6ML ~~LOC~~ SOSY
6.0000 mg | PREFILLED_SYRINGE | Freq: Once | SUBCUTANEOUS | Status: AC
  Administered 2022-04-04: 6 mg via SUBCUTANEOUS
  Filled 2022-04-04: qty 0.6

## 2022-04-04 NOTE — Progress Notes (Signed)
Patient started treatment on 04/02/2022.   Oncology Nurse Navigator Documentation     04/04/2022    8:30 AM  Oncology Nurse Navigator Flowsheets  Phase of Treatment Chemo  Chemotherapy Actual Start Date: 04/02/2022  Navigator Follow Up Date: 04/18/2022  Navigator Follow Up Reason: Follow-up Appointment;Chemotherapy  Navigator Location [facility]  Navigator Encounter Type Appt/Treatment Plan Review  Patient Visit Type MedOnc  Treatment Phase Active Tx  Barriers/Navigation Needs Coordination of Care;Education  Interventions None Required  Acuity Level 2-Minimal Needs (1-2 Barriers Identified)  Support Groups/Services Friends and Family  Time Spent with Patient 15

## 2022-04-04 NOTE — Patient Instructions (Signed)

## 2022-04-09 ENCOUNTER — Encounter: Payer: BC Managed Care – PPO | Admitting: Dietician

## 2022-04-17 ENCOUNTER — Ambulatory Visit
Admission: RE | Admit: 2022-04-17 | Discharge: 2022-04-17 | Disposition: A | Discharge status: Home / Self Care | Payer: BC Managed Care – PPO | Source: Ambulatory Visit | Attending: Radiation Oncology | Admitting: Radiation Oncology

## 2022-04-17 NOTE — Progress Notes (Signed)
  Radiation Oncology         [phone]) [phone] ________________________________  Name: Makayla Good MRN: [id number]  Date of Service: 04/17/2022  DOB: 04-30-1970  Post Treatment Telephone Note  Diagnosis:   51 yo woman with multi-level spinal cord compression from newly diagnosed stage IV pancreatic cancer.   Intent: Curative  Radiation Treatment Dates: 03/06/2022 through 03/23/2022 Site Technique Total Dose (Gy) Dose per Fx (Gy) Completed Fx Beam Energies  Cervical Spine: Spine_C3-C6 3D 30/30 3 10/10 6X, 10X  Thoracic Spine: Spine_T1-T5 3D 30/30 3 10/10 10X  Lumbar Spine: Spine_L2-L4 3D 33/33 3 11/11 10X, 15X  Thoracic Spine: Spine_T8-T11 3D 30/30 3 10/10 10X, 15X   (as documented in provider EOT note)   The patient was available for call today.  Patient's current complaints are tingling of the bilateral lower legs and LT arm, that comes and goes.  The patient did note fatigue during radiation but has since improved. The patient did not note skin changes in the field of radiation during therapy. The patient has noticed improvement in pain in the area(s) treated with radiation. The patient is taking dexamethasone. The patient does not have symptoms of  weakness or loss of control of the extremities. The patient does not have symptoms of headache. The patient does not have symptoms of seizure or uncontrolled movement. The patient does not have symptoms of changes in vision. The patient does not have changes in speech.  The patient does not have confusion.  The patient is scheduled for ongoing care with Dr. [name] in medical oncology. The patient was encouraged to call if she develops concerns or questions regarding radiation.  This concludes the interview.   Leandra Kern, LPN

## 2022-04-18 ENCOUNTER — Other Ambulatory Visit: Payer: Self-pay

## 2022-04-18 ENCOUNTER — Inpatient Hospital Stay: Payer: BC Managed Care – PPO

## 2022-04-18 ENCOUNTER — Encounter: Payer: Self-pay | Admitting: Hematology & Oncology

## 2022-04-18 ENCOUNTER — Inpatient Hospital Stay: Payer: BC Managed Care – PPO | Admitting: Licensed Clinical Social Worker

## 2022-04-18 ENCOUNTER — Other Ambulatory Visit: Payer: Self-pay | Admitting: *Deleted

## 2022-04-18 ENCOUNTER — Telehealth: Payer: Self-pay | Admitting: *Deleted

## 2022-04-18 ENCOUNTER — Inpatient Hospital Stay (HOSPITAL_BASED_OUTPATIENT_CLINIC_OR_DEPARTMENT_OTHER): Payer: BC Managed Care – PPO | Admitting: Hematology & Oncology

## 2022-04-18 VITALS — BP 128/77 | HR 82

## 2022-04-18 VITALS — BP 123/82 | HR 84 | Temp 98.5°F | Resp 18 | Ht 65.0 in | Wt 178.0 lb

## 2022-04-18 DIAGNOSIS — C259 Malignant neoplasm of pancreas, unspecified: Secondary | ICD-10-CM

## 2022-04-18 DIAGNOSIS — Z79899 Other long term (current) drug therapy: Secondary | ICD-10-CM | POA: Diagnosis not present

## 2022-04-18 DIAGNOSIS — C7951 Secondary malignant neoplasm of bone: Secondary | ICD-10-CM

## 2022-04-18 DIAGNOSIS — Z5111 Encounter for antineoplastic chemotherapy: Secondary | ICD-10-CM | POA: Diagnosis not present

## 2022-04-18 DIAGNOSIS — C787 Secondary malignant neoplasm of liver and intrahepatic bile duct: Secondary | ICD-10-CM | POA: Diagnosis not present

## 2022-04-18 LAB — CMP (CANCER CENTER ONLY)
ALT: 20 U/L (ref 0–44)
AST: 20 U/L (ref 15–41)
Albumin: 3.6 g/dL (ref 3.5–5.0)
Alkaline Phosphatase: 144 U/L — ABNORMAL HIGH (ref 38–126)
Anion gap: 10 (ref 5–15)
BUN: 10 mg/dL (ref 6–20)
CO2: 28 mmol/L (ref 22–32)
Calcium: 8.4 mg/dL — ABNORMAL LOW (ref 8.9–10.3)
Chloride: 104 mmol/L (ref 98–111)
Creatinine: 0.56 mg/dL (ref 0.44–1.00)
GFR, Estimated: >60 mL/min (ref >60)
Glucose, Bld: 120 mg/dL — ABNORMAL HIGH (ref 70–99)
Potassium: 2.7 mmol/L — CL (ref 3.5–5.1)
Sodium: 142 mmol/L (ref 135–145)
Total Bilirubin: 0.5 mg/dL (ref 0.3–1.2)
Total Protein: 5.8 g/dL — ABNORMAL LOW (ref 6.5–8.1)

## 2022-04-18 LAB — CBC WITH DIFFERENTIAL (CANCER CENTER ONLY)
Abs Immature Granulocytes: 0.09 10*3/uL — ABNORMAL HIGH (ref 0.00–0.07)
Basophils Absolute: 0 10*3/uL (ref 0.0–0.1)
Basophils Relative: 0 %
Eosinophils Absolute: 0 10*3/uL (ref 0.0–0.5)
Eosinophils Relative: 0 %
HCT: 26 % — ABNORMAL LOW (ref 36.0–46.0)
Hemoglobin: 8.6 g/dL — ABNORMAL LOW (ref 12.0–15.0)
Immature Granulocytes: 1 %
Lymphocytes Relative: 7 %
Lymphs Abs: 0.5 10*3/uL — ABNORMAL LOW (ref 0.7–4.0)
MCH: 32.7 pg (ref 26.0–34.0)
MCHC: 33.1 g/dL (ref 30.0–36.0)
MCV: 98.9 fL (ref 80.0–100.0)
Monocytes Absolute: 0.5 10*3/uL (ref 0.1–1.0)
Monocytes Relative: 8 %
Neutro Abs: 5.3 10*3/uL (ref 1.7–7.7)
Neutrophils Relative %: 84 %
Platelet Count: 154 10*3/uL (ref 150–400)
RBC: 2.63 MIL/uL — ABNORMAL LOW (ref 3.87–5.11)
RDW: 16.8 % — ABNORMAL HIGH (ref 11.5–15.5)
WBC Count: 6.4 10*3/uL (ref 4.0–10.5)
nRBC: 0.6 % — ABNORMAL HIGH (ref 0.0–0.2)

## 2022-04-18 LAB — PREPARE RBC (CROSSMATCH)

## 2022-04-18 LAB — LACTATE DEHYDROGENASE: LDH: 234 U/L — ABNORMAL HIGH (ref 98–192)

## 2022-04-18 MED ORDER — ATROPINE SULFATE 1 MG/ML IV SOLN
0.5000 mg | Freq: Once | INTRAVENOUS | Status: AC | PRN
  Administered 2022-04-18: 0.5 mg via INTRAVENOUS
  Filled 2022-04-18: qty 1

## 2022-04-18 MED ORDER — SODIUM CHLORIDE 0.9 % IV SOLN
150.0000 mg | Freq: Once | INTRAVENOUS | Status: AC
  Administered 2022-04-18: 150 mg via INTRAVENOUS
  Filled 2022-04-18: qty 150

## 2022-04-18 MED ORDER — DEXTROSE 5 % IV SOLN: Freq: Once | INTRAVENOUS | Status: AC

## 2022-04-18 MED ORDER — SODIUM CHLORIDE 0.9 % IV SOLN
400.0000 mg/m2 | Freq: Once | INTRAVENOUS | Status: AC
  Administered 2022-04-18: 812 mg via INTRAVENOUS
  Filled 2022-04-18: qty 40.6

## 2022-04-18 MED ORDER — SODIUM CHLORIDE 0.9 % IV SOLN
150.0000 mg/m2 | Freq: Once | INTRAVENOUS | Status: AC
  Administered 2022-04-18: 300 mg via INTRAVENOUS
  Filled 2022-04-18: qty 15

## 2022-04-18 MED ORDER — PALONOSETRON HCL INJECTION 0.25 MG/5ML
0.2500 mg | Freq: Once | INTRAVENOUS | Status: AC
  Administered 2022-04-18: 0.25 mg via INTRAVENOUS
  Filled 2022-04-18: qty 5

## 2022-04-18 MED ORDER — SODIUM CHLORIDE 0.9 % IV SOLN
10.0000 mg | Freq: Once | INTRAVENOUS | Status: AC
  Administered 2022-04-18: 10 mg via INTRAVENOUS
  Filled 2022-04-18: qty 10

## 2022-04-18 MED ORDER — LORAZEPAM 2 MG/ML IJ SOLN
0.5000 mg | Freq: Once | INTRAMUSCULAR | Status: AC
  Administered 2022-04-18: 0.5 mg via INTRAVENOUS
  Filled 2022-04-18: qty 1

## 2022-04-18 MED ORDER — SODIUM CHLORIDE 0.9 % IV SOLN
5000.0000 mg | INTRAVENOUS | Status: DC
  Administered 2022-04-18: 5000 mg via INTRAVENOUS
  Filled 2022-04-18: qty 100

## 2022-04-18 MED ORDER — POTASSIUM CHLORIDE CRYS ER 20 MEQ PO TBCR
40.0000 meq | EXTENDED_RELEASE_TABLET | ORAL | Status: AC
  Administered 2022-04-18 (×2): 40 meq via ORAL
  Filled 2022-04-18 (×2): qty 2

## 2022-04-18 MED ORDER — POTASSIUM CHLORIDE CRYS ER 20 MEQ PO TBCR: 40.0000 meq | EXTENDED_RELEASE_TABLET | Freq: Every day | ORAL | 2 refills | Status: DC

## 2022-04-18 MED ORDER — OXALIPLATIN CHEMO INJECTION 100 MG/20ML
85.0000 mg/m2 | Freq: Once | INTRAVENOUS | Status: AC
  Administered 2022-04-18: 175 mg via INTRAVENOUS
  Filled 2022-04-18: qty 35

## 2022-04-18 NOTE — Progress Notes (Signed)
[facility] CSW Progress Note  Holiday representative met with patient to assess needs while she was receiving treatment.  Her son was present and visiting from college.  She also has a 51 y/o.  She remains concerned about how her children are adjusting to her cancer diagnosis.   Discussed her feelings and how she did better when she had a break from treatment last week.  She stated she is doing well emotionally and is getting all of her needs met.  She expressed no other issues.    Rodman Pickle Jurrell Royster, LCSW

## 2022-04-18 NOTE — Progress Notes (Signed)
1410 during infusion of leucovorin and irinotican, patient started complaining of tingling in both hands and fingers, states it started after she went to the bathroom and washed her hands. Patient received oxaliplatin today. Infusion paused and normal saline ran.Upon checking patients vitals for potential reaction she states her right eye felt heavy. No visual problems, no other complaints, appearence of eye looks normal.MD aware, verbal order received for ativan 0.'5mg'$ , advised to continue infusion. 1415 patient states eye feels better, hands are still tingling. Administered ativan .'5mg'$  at 1430, patient states her eye and hands feels better. Leucovorin restarted per MD.

## 2022-04-18 NOTE — Patient Instructions (Signed)

## 2022-04-18 NOTE — Telephone Encounter (Signed)
Dr. [name] notified of potassium of 2.7.  Order received to give pt 40 meq of oral potassium now and 40 meq of potassium at discharge today and to increase at home dose of oral potassium from 10 meq to 40 meq.

## 2022-04-18 NOTE — Patient Instructions (Signed)
  [facility]  Discharge Instructions: Thank you for choosing [facility] to provide your oncology and hematology care.   If you have a lab appointment with the [facility], please go directly to the [facility] and check in at the registration area.  Wear comfortable clothing and clothing appropriate for easy access to any Portacath or PICC line.   We strive to give you quality time with your provider. You may need to reschedule your appointment if you arrive late (15 or more minutes).  Arriving late affects you and other patients whose appointments are after yours.  Also, if you miss three or more appointments without notifying the office, you may be dismissed from the clinic at the provider's discretion.      For prescription refill requests, have your pharmacy contact our office and allow 72 hours for refills to be completed.    Today you received the following chemotherapy and/or immunotherapy agents oxali, leucovorin, cpt-11 atropine      To help prevent nausea and vomiting after your treatment, we encourage you to take your nausea medication as directed.  BELOW ARE SYMPTOMS THAT SHOULD BE REPORTED IMMEDIATELY: *FEVER GREATER THAN 100.4 F (38 C) OR HIGHER *CHILLS OR SWEATING *NAUSEA AND VOMITING THAT IS NOT CONTROLLED WITH YOUR NAUSEA MEDICATION *UNUSUAL SHORTNESS OF BREATH *UNUSUAL BRUISING OR BLEEDING *URINARY PROBLEMS (pain or burning when urinating, or frequent urination) *BOWEL PROBLEMS (unusual diarrhea, constipation, pain near the anus) TENDERNESS IN MOUTH AND THROAT WITH OR WITHOUT PRESENCE OF ULCERS (sore throat, sores in mouth, or a toothache) UNUSUAL RASH, SWELLING OR PAIN  UNUSUAL VAGINAL DISCHARGE OR ITCHING   Items with * indicate a potential emergency and should be followed up as soon as possible or go to the Emergency Department if any problems should occur.  Please show the CHEMOTHERAPY ALERT CARD or IMMUNOTHERAPY  ALERT CARD at check-in to the Emergency Department and triage nurse. Should you have questions after your visit or need to cancel or reschedule your appointment, please contact [facility]  [phone] and follow the prompts.  Office hours are 8:00 a.m. to 4:30 p.m. Monday - Friday. Please note that voicemails left after 4:00 p.m. may not be returned until the following business day.  We are closed weekends and major holidays. You have access to a nurse at all times for urgent questions. Please call the main number to the clinic [phone] and follow the prompts.  For any non-urgent questions, you may also contact your provider using MyChart. We now offer e-Visits for anyone 48 and older to request care online for non-urgent symptoms. For details visit [email or web address].   Also download the MyChart app! Go to the app store, search "MyChart", open the app, select [facility], and log in with your MyChart username and password.

## 2022-04-18 NOTE — Progress Notes (Signed)
Infuse 5FU pump over 42 hrs instead of 46 hrs since pt coming back to get PRBC on 12/29 per Dr [name]

## 2022-04-18 NOTE — Progress Notes (Signed)
OK to treat with HGB-8.6 and Potassium-2.7 per order of Dr. [name].

## 2022-04-18 NOTE — Progress Notes (Signed)
Hematology and Oncology Follow Up Visit  Makayla Good [id number] December 05, 1970 51 y.o. 04/18/2022   Principle Diagnosis:  Metastatic adenocarcinoma of the pancreas-liver metastasis -- NO actionable mutation  Current Therapy:   FOLFIRINOX -s/p cycle #1 - start on 04/02/2022 XRT to spine -- completed on 03/23/2022     Interim History:  Makayla Good is back for follow-up.  She actually tolerated her first cycle of chemotherapy pretty well.  She had a nice Christmas.  She does feel little bit more tired.  She does have a little more swelling in the legs.  Her hemoglobin is only 8.6.  I really think that given this protocol, we will need to transfuse her.  I talked her about this.  I explained to her how transfusions were.  I explained why I thought a transfusion would be beneficial.  With the holiday weekend coming up, I just do not want to see her get worse and then end up in the ER on New Years.  She has had no cough or shortness of breath.  Her back pain seems to be doing a little bit better.  She has had no rashes.  There is been no obvious melena or bright red blood per rectum.  She has had no constipation.  There is been no swollen lymph nodes.  She has had no headache.  Overall, I would have said that her performance status is ECOG 1.   Medications:  Current Outpatient Medications:    amLODipine (NORVASC) 5 MG tablet, TAKE 1 TABLET (5 MG TOTAL) BY MOUTH DAILY., Disp: 90 tablet, Rfl: 1   calcium citrate (CALCITRATE - DOSED IN MG ELEMENTAL CALCIUM) 950 (200 Ca) MG tablet, Take 200 mg of elemental calcium by mouth daily., Disp: , Rfl:    ciprofloxacin (CIPRO) 500 MG tablet, Take 1 tablet (500 mg total) by mouth 2 (two) times daily., Disp: 10 tablet, Rfl: 5   cyclobenzaprine (FLEXERIL) 10 MG tablet, TAKE 1 TABLET BY MOUTH AT BEDTIME AS NEEDED FOR MUSCLE SPASMS, Disp: 30 tablet, Rfl: 0   dexamethasone (DECADRON) 4 MG tablet, Take 2 tablets (8 mg total) by mouth daily. Take 2  tablets daily x 3 days starting the day after chemotherapy. Take with food. (Patient not taking: Reported on 04/02/2022), Disp: 30 tablet, Rfl: 1   fluticasone (FLONASE) 50 MCG/ACT nasal spray, SPRAY 2 SPRAYS INTO EACH NOSTRIL EVERY DAY (Patient taking differently: Place 2 sprays into both nostrils daily as needed for allergies.), Disp: 48 mL, Rfl: 2   hydrochlorothiazide (HYDRODIURIL) 25 MG tablet, Take 1 tablet (25 mg total) by mouth daily., Disp: 90 tablet, Rfl: 1   lidocaine-prilocaine (EMLA) cream, Apply to affected area once, Disp: 30 g, Rfl: 3   loperamide (IMODIUM) 2 MG capsule, Take 2 tabs by mouth with first loose stool, then 1 tab with each additional loose stool as needed. Do not exceed 8 tabs in a 24-hour period (Patient not taking: Reported on 04/02/2022), Disp: 60 capsule, Rfl: 5   Melatonin 10 MG TABS, Take 10 mg by mouth at bedtime as needed (sleep)., Disp: , Rfl:    Multiple Vitamins-Minerals (CELEBRATE MULTI-COMPLETE 36 PO), Take 1 each by mouth daily., Disp: , Rfl:    omeprazole (PRILOSEC) 40 MG capsule, Take 1 capsule (40 mg total) by mouth daily. (Patient taking differently: Take 40 mg by mouth daily as needed (acid reflux).), Disp: 30 capsule, Rfl: 0   ondansetron (ZOFRAN) 8 MG tablet, Take 1 tablet (8 mg total) by mouth every  8 (eight) hours as needed for nausea or vomiting. Start on the third day after cisplatin, Disp: 30 tablet, Rfl: 1   Oxycodone HCl 10 MG TABS, Take 1 tablet (10 mg total) by mouth every 6 (six) hours as needed., Disp: 120 tablet, Rfl: 0   potassium chloride (KLOR-CON) 10 MEQ tablet, TAKE 1 TABLET BY MOUTH EVERY DAY, Disp: 90 tablet, Rfl: 1   prochlorperazine (COMPAZINE) 10 MG tablet, Take 1 tablet (10 mg total) by mouth every 6 (six) hours as needed for nausea or vomiting (Nausea or vomiting)., Disp: 30 tablet, Rfl: 1   temazepam (RESTORIL) 15 MG capsule, Take 1 capsule (15 mg total) by mouth at bedtime as needed for sleep., Disp: 30 capsule, Rfl: 0 No  current facility-administered medications for this visit.  Facility-Administered Medications Ordered in Other Visits:    sodium chloride flush (NS) 0.9 % injection 10 mL, 10 mL, Intravenous, PRN, Volanda Napoleon, MD, 10 mL at 03/26/22 7915  Allergies: No Known Allergies  Past Medical History, Surgical history, Social history, and Family History were reviewed and updated.  Review of Systems: Review of Systems  Constitutional: Negative.   HENT:  Negative.    Eyes: Negative.   Respiratory: Negative.    Cardiovascular: Negative.   Gastrointestinal:  Positive for abdominal pain and nausea.  Endocrine: Negative.   Genitourinary: Negative.    Musculoskeletal: Negative.   Skin: Negative.   Neurological: Negative.   Hematological: Negative.   Psychiatric/Behavioral: Negative.      Physical Exam:  height is '5\' 5"'$  (1.651 m) and weight is 178 lb (80.7 kg). Her oral temperature is 98.5 F (36.9 C). Her blood pressure is 123/82 and her pulse is 84. Her respiration is 18 and oxygen saturation is 99%.   Wt Readings from Last 3 Encounters:  04/18/22 178 lb (80.7 kg)  04/02/22 183 lb 6 oz (83.2 kg)  04/02/22 181 lb 3.2 oz (82.2 kg)    Physical Exam Vitals reviewed.  HENT:     Head: Normocephalic and atraumatic.  Eyes:     Pupils: Pupils are equal, round, and reactive to light.  Cardiovascular:     Rate and Rhythm: Normal rate and regular rhythm.     Heart sounds: Normal heart sounds.  Pulmonary:     Effort: Pulmonary effort is normal.     Breath sounds: Normal breath sounds.  Abdominal:     General: Bowel sounds are normal.     Palpations: Abdomen is soft.  Musculoskeletal:        General: No tenderness or deformity. Normal range of motion.     Cervical back: Normal range of motion.  Lymphadenopathy:     Cervical: No cervical adenopathy.  Skin:    General: Skin is warm and dry.     Findings: No erythema or rash.  Neurological:     Mental Status: She is alert and oriented to  person, place, and time.  Psychiatric:        Behavior: Behavior normal.        Thought Content: Thought content normal.        Judgment: Judgment normal.     Lab Results  Component Value Date   WBC 2.7 (L) 04/02/2022   HGB 9.6 (L) 04/02/2022   HCT 29.1 (L) 04/02/2022   MCV 96.4 04/02/2022   PLT 156 04/02/2022     Chemistry      Component Value Date/Time   NA 141 04/02/2022 0842   K 2.9 (L) 04/02/2022 0569  CL 104 04/02/2022 0842   CO2 28 04/02/2022 0842   BUN 11 04/02/2022 0842   CREATININE 0.49 04/02/2022 0842   CREATININE 0.79 12/08/2021 1420      Component Value Date/Time   CALCIUM 9.0 04/02/2022 0842   ALKPHOS 112 04/02/2022 0842   AST 21 04/02/2022 0842   ALT 22 04/02/2022 0842   BILITOT 0.8 04/02/2022 0842      Impression and Plan: Ms. Villacres is a very nice 51 year old African-American female.  She has metastatic pancreatic cancer.  This is an adenocarcinoma.  Her CA 19-9 right before treatment was 216.  Will be interesting to see what it is now.    Unfortunately, there is no mutation that we can target.  I am not surprised given that this is pancreatic cancer.  We will go ahead with a second cycle of FOLFIRINOX.  I think that she tolerated this pretty well.  After 4 cycles, we will plan for follow-up scans.  However, I would think that the CA 19-9 will let us know how things are going.  We will go ahead and transfuse her on Friday.  I do think this will help her out.  Will be interesting to see what her chemistry studies look like.  I know she is on potassium right now.  I do think that she will benefit from Neulasta.  Her white cell count really looks quite good.  I will plan to get her back in another couple weeks.    Volanda Napoleon, MD 12/27/20238:20 AM

## 2022-04-19 ENCOUNTER — Ambulatory Visit: Payer: BC Managed Care – PPO | Admitting: Hematology & Oncology

## 2022-04-19 ENCOUNTER — Inpatient Hospital Stay: Payer: BC Managed Care – PPO

## 2022-04-19 ENCOUNTER — Ambulatory Visit: Payer: Self-pay | Admitting: Urology

## 2022-04-19 ENCOUNTER — Other Ambulatory Visit: Payer: BC Managed Care – PPO

## 2022-04-19 ENCOUNTER — Ambulatory Visit: Payer: BC Managed Care – PPO

## 2022-04-20 ENCOUNTER — Inpatient Hospital Stay: Payer: BC Managed Care – PPO

## 2022-04-20 VITALS — BP 136/88 | HR 68 | Temp 98.3°F | Resp 17

## 2022-04-20 DIAGNOSIS — Z5111 Encounter for antineoplastic chemotherapy: Secondary | ICD-10-CM | POA: Diagnosis not present

## 2022-04-20 DIAGNOSIS — Z79899 Other long term (current) drug therapy: Secondary | ICD-10-CM | POA: Diagnosis not present

## 2022-04-20 DIAGNOSIS — C787 Secondary malignant neoplasm of liver and intrahepatic bile duct: Secondary | ICD-10-CM | POA: Diagnosis not present

## 2022-04-20 DIAGNOSIS — C259 Malignant neoplasm of pancreas, unspecified: Secondary | ICD-10-CM | POA: Diagnosis not present

## 2022-04-20 DIAGNOSIS — Z95828 Presence of other vascular implants and grafts: Secondary | ICD-10-CM

## 2022-04-20 LAB — CANCER ANTIGEN 19-9: CA 19-9: 236 U/mL — ABNORMAL HIGH (ref 0–35)

## 2022-04-20 MED ORDER — HEPARIN SOD (PORK) LOCK FLUSH 100 UNIT/ML IV SOLN
500.0000 [IU] | Freq: Every day | INTRAVENOUS | Status: AC | PRN
  Administered 2022-04-20: 500 [IU]

## 2022-04-20 MED ORDER — DIPHENHYDRAMINE HCL 25 MG PO CAPS: 25.0000 mg | ORAL_CAPSULE | Freq: Once | ORAL | Status: DC

## 2022-04-20 MED ORDER — PEGFILGRASTIM-CBQV 6 MG/0.6ML ~~LOC~~ SOSY
6.0000 mg | PREFILLED_SYRINGE | Freq: Once | SUBCUTANEOUS | Status: AC
  Administered 2022-04-20: 6 mg via SUBCUTANEOUS
  Filled 2022-04-20: qty 0.6

## 2022-04-20 MED ORDER — SODIUM CHLORIDE 0.9% IV SOLUTION
250.0000 mL | Freq: Once | INTRAVENOUS | Status: AC
  Administered 2022-04-20: 250 mL via INTRAVENOUS

## 2022-04-20 MED ORDER — SODIUM CHLORIDE 0.9% FLUSH
10.0000 mL | INTRAVENOUS | Status: DC | PRN
  Administered 2022-04-20: 10 mL via INTRAVENOUS

## 2022-04-20 MED ORDER — SODIUM CHLORIDE 0.9% FLUSH
10.0000 mL | INTRAVENOUS | Status: AC | PRN
  Administered 2022-04-20: 10 mL

## 2022-04-20 MED ORDER — FUROSEMIDE 10 MG/ML IJ SOLN
20.0000 mg | Freq: Once | INTRAMUSCULAR | Status: AC
  Administered 2022-04-20: 20 mg via INTRAVENOUS
  Filled 2022-04-20: qty 4

## 2022-04-20 MED ORDER — ACETAMINOPHEN 325 MG PO TABS: 650.0000 mg | ORAL_TABLET | Freq: Once | ORAL | Status: DC

## 2022-04-20 NOTE — Patient Instructions (Signed)
Blood Transfusion, Adult A blood transfusion is a procedure in which you receive blood through an IV tube. You may need this procedure because of: A bleeding disorder. An illness. An injury. A surgery. The blood may come from someone else (a donor). You may also be able to donate blood for yourself before a surgery. The blood given in a transfusion may be made up of different types of cells. You may get: Red blood cells. These carry oxygen to the cells in the body. Platelets. These help your blood to clot. Plasma. This is the liquid part of your blood. It carries proteins and other substances through the body. White blood cells. These help you fight infections. If you have a clotting disorder, you may also get other types of blood products. Depending on the type of blood product, this procedure may take 1-4 hours to complete. Tell your doctor about: Any bleeding problems you have. Any reactions you have had during a blood transfusion in the past. Any allergies you have. All medicines you are taking, including vitamins, herbs, eye drops, creams, and over-the-counter medicines. Any surgeries you have had. Any medical conditions you have. Whether you are pregnant or may be pregnant. What are the risks? Talk with your health care provider about risks. The most common problems include: A mild allergic reaction. This includes red, swollen areas of skin (hives) and itching. Fever or chills. This may be the body's response to new blood cells received. This may happen during or up to 4 hours after the transfusion. More serious problems may include: A serious allergic reaction. This includes breathing trouble or swelling around the face and lips. Too much fluid in the lungs. This may cause breathing problems. Lung injury. This causes breathing trouble and low oxygen in the blood. This can happen within hours of the transfusion or days later. Too much iron. This can happen after getting many blood  transfusions over a period of time. An infection or virus passed through the blood. This is rare. Donated blood is carefully tested before it is given. Your body's defense system (immune system) trying to attack the new blood cells. This is rare. Symptoms may include fever, chills, nausea, low blood pressure, and low back or chest pain. Donated cells attacking healthy tissues. This is rare. What happens before the procedure? You will have a blood test to find out your blood type. The test also finds out what type of blood your body will accept and matches it to the donor type. If you are going to have a planned surgery, you may be able to donate your own blood. This may be done in case you need a transfusion. You will have your temperature, blood pressure, and pulse checked. You may receive medicine to help prevent an allergic reaction. This may be done if you have had a reaction to a transfusion before. This medicine may be given to you by mouth or through an IV tube. What happens during the procedure?  An IV tube will be put into one of your veins. The bag of blood will be attached to your IV tube. Then, the blood will enter through your vein. Your temperature, blood pressure, and pulse will be checked often. This is done to find early signs of a transfusion reaction. Tell your nurse right away if you have any of these symptoms: Shortness of breath or trouble breathing. Chest or back pain. Fever or chills. Red, swollen areas of skin or itching. If you have any signs   or symptoms of a reaction, your transfusion will be stopped. You may also be given medicine. When the transfusion is finished, your IV tube will be taken out. Pressure may be put on the IV site for a few minutes. A bandage (dressing) will be put on the IV site. The procedure may vary among doctors and hospitals. What happens after the procedure? You will be monitored until you leave the hospital or clinic. This includes  checking your temperature, blood pressure, pulse, breathing rate, and blood oxygen level. Your blood may be tested to see how you have responded to the transfusion. You may be warmed with fluids or blankets. This is done to keep the temperature of your body normal. If you have your procedure in an outpatient setting, you will be told whom to contact to report any reactions. Where to find more information Visit the [employer]: redcross.org Summary A blood transfusion is a procedure in which you receive blood through an IV tube. The blood you are given may be made up of different blood cells. You may receive red blood cells, platelets, plasma, or white blood cells. Your temperature, blood pressure, and pulse will be checked often. After the procedure, your blood may be tested to see how you have responded. This information is not intended to replace advice given to you by your health care provider. Make sure you discuss any questions you have with your health care provider. Document Revised: 07/07/2021 Document Reviewed: 07/07/2021 Elsevier Patient Education  2023 Elsevier Inc.  

## 2022-04-21 LAB — TYPE AND SCREEN
ABO/RH(D): A NEG
Antibody Screen: NEGATIVE
Unit division: 0
Unit division: 0

## 2022-04-21 LAB — BPAM RBC
Blood Product Expiration Date: 202401112359
Blood Product Expiration Date: 202401112359
ISSUE DATE / TIME: 202312290706
ISSUE DATE / TIME: 202312290706
Unit Type and Rh: 600
Unit Type and Rh: 600

## 2022-04-24 ENCOUNTER — Encounter: Payer: Self-pay | Admitting: *Deleted

## 2022-04-24 NOTE — Progress Notes (Signed)
Patient did well with her first treatment. She was transfused last week. No additional needs at this time.   Oncology Nurse Navigator Documentation     04/24/2022    9:15 AM  Oncology Nurse Navigator Flowsheets  Navigator Follow Up Date: 05/02/2022  Navigator Follow Up Reason: Follow-up Appointment;Chemotherapy  Navigator Location [facility]  Navigator Encounter Type Appt/Treatment Plan Review  Treatment Initiated Date 04/02/2022  Patient Visit Type MedOnc  Treatment Phase Active Tx  Barriers/Navigation Needs Coordination of Care;Education  Interventions None Required  Acuity Level 2-Minimal Needs (1-2 Barriers Identified)  Support Groups/Services Friends and Family  Time Spent with Patient 15

## 2022-05-02 ENCOUNTER — Inpatient Hospital Stay: Payer: BC Managed Care – PPO | Attending: Hematology & Oncology

## 2022-05-02 ENCOUNTER — Ambulatory Visit (HOSPITAL_BASED_OUTPATIENT_CLINIC_OR_DEPARTMENT_OTHER)
Admission: RE | Admit: 2022-05-02 | Discharge: 2022-05-02 | Disposition: A | Discharge status: Home / Self Care | Payer: BC Managed Care – PPO | Source: Ambulatory Visit | Attending: Family | Admitting: Family

## 2022-05-02 ENCOUNTER — Inpatient Hospital Stay: Payer: BC Managed Care – PPO | Admitting: Family

## 2022-05-02 ENCOUNTER — Encounter: Payer: Self-pay | Admitting: Family

## 2022-05-02 ENCOUNTER — Encounter: Payer: Self-pay | Admitting: *Deleted

## 2022-05-02 ENCOUNTER — Other Ambulatory Visit: Payer: Self-pay

## 2022-05-02 ENCOUNTER — Inpatient Hospital Stay: Payer: BC Managed Care – PPO

## 2022-05-02 VITALS — BP 119/85 | HR 80 | Temp 98.7°F | Resp 18 | Ht 65.0 in | Wt 168.4 lb

## 2022-05-02 DIAGNOSIS — C259 Malignant neoplasm of pancreas, unspecified: Secondary | ICD-10-CM | POA: Diagnosis not present

## 2022-05-02 DIAGNOSIS — C787 Secondary malignant neoplasm of liver and intrahepatic bile duct: Secondary | ICD-10-CM | POA: Diagnosis not present

## 2022-05-02 DIAGNOSIS — K8689 Other specified diseases of pancreas: Secondary | ICD-10-CM

## 2022-05-02 DIAGNOSIS — J189 Pneumonia, unspecified organism: Secondary | ICD-10-CM

## 2022-05-02 DIAGNOSIS — Z79899 Other long term (current) drug therapy: Secondary | ICD-10-CM | POA: Insufficient documentation

## 2022-05-02 DIAGNOSIS — C7951 Secondary malignant neoplasm of bone: Secondary | ICD-10-CM

## 2022-05-02 DIAGNOSIS — Z5111 Encounter for antineoplastic chemotherapy: Secondary | ICD-10-CM | POA: Insufficient documentation

## 2022-05-02 DIAGNOSIS — R059 Cough, unspecified: Secondary | ICD-10-CM | POA: Diagnosis not present

## 2022-05-02 DIAGNOSIS — R058 Other specified cough: Secondary | ICD-10-CM

## 2022-05-02 DIAGNOSIS — E876 Hypokalemia: Secondary | ICD-10-CM

## 2022-05-02 DIAGNOSIS — Z95828 Presence of other vascular implants and grafts: Secondary | ICD-10-CM | POA: Diagnosis not present

## 2022-05-02 LAB — CBC WITH DIFFERENTIAL (CANCER CENTER ONLY)
Abs Immature Granulocytes: 0.07 10*3/uL (ref 0.00–0.07)
Basophils Absolute: 0 10*3/uL (ref 0.0–0.1)
Basophils Relative: 0 %
Eosinophils Absolute: 0 10*3/uL (ref 0.0–0.5)
Eosinophils Relative: 0 %
HCT: 33.5 % — ABNORMAL LOW (ref 36.0–46.0)
Hemoglobin: 11 g/dL — ABNORMAL LOW (ref 12.0–15.0)
Immature Granulocytes: 3 %
Lymphocytes Relative: 12 %
Lymphs Abs: 0.3 10*3/uL — ABNORMAL LOW (ref 0.7–4.0)
MCH: 31.2 pg (ref 26.0–34.0)
MCHC: 32.8 g/dL (ref 30.0–36.0)
MCV: 94.9 fL (ref 80.0–100.0)
Monocytes Absolute: 0.4 10*3/uL (ref 0.1–1.0)
Monocytes Relative: 15 %
Neutro Abs: 1.9 10*3/uL (ref 1.7–7.7)
Neutrophils Relative %: 70 %
Platelet Count: 202 10*3/uL (ref 150–400)
RBC: 3.53 MIL/uL — ABNORMAL LOW (ref 3.87–5.11)
RDW: 17 % — ABNORMAL HIGH (ref 11.5–15.5)
WBC Count: 2.7 10*3/uL — ABNORMAL LOW (ref 4.0–10.5)
nRBC: 0 % (ref 0.0–0.2)

## 2022-05-02 LAB — CMP (CANCER CENTER ONLY)
ALT: 36 U/L (ref 0–44)
AST: 44 U/L — ABNORMAL HIGH (ref 15–41)
Albumin: 3 g/dL — ABNORMAL LOW (ref 3.5–5.0)
Alkaline Phosphatase: 185 U/L — ABNORMAL HIGH (ref 38–126)
Anion gap: 8 (ref 5–15)
BUN: 7 mg/dL (ref 6–20)
CO2: 22 mmol/L (ref 22–32)
Calcium: 8.6 mg/dL — ABNORMAL LOW (ref 8.9–10.3)
Chloride: 107 mmol/L (ref 98–111)
Creatinine: 0.54 mg/dL (ref 0.44–1.00)
GFR, Estimated: >60 mL/min (ref >60)
Glucose, Bld: 113 mg/dL — ABNORMAL HIGH (ref 70–99)
Potassium: 2.8 mmol/L — ABNORMAL LOW (ref 3.5–5.1)
Sodium: 137 mmol/L (ref 135–145)
Total Bilirubin: 0.3 mg/dL (ref 0.3–1.2)
Total Protein: 6.4 g/dL — ABNORMAL LOW (ref 6.5–8.1)

## 2022-05-02 LAB — SAMPLE TO BLOOD BANK

## 2022-05-02 LAB — LACTATE DEHYDROGENASE: LDH: 340 U/L — ABNORMAL HIGH (ref 98–192)

## 2022-05-02 MED ORDER — CEFDINIR 300 MG PO CAPS: 600.0000 mg | ORAL_CAPSULE | Freq: Every day | ORAL | 0 refills | Status: DC

## 2022-05-02 MED ORDER — SODIUM CHLORIDE 0.9 % IV SOLN: INTRAVENOUS | Status: DC

## 2022-05-02 MED ORDER — SODIUM CHLORIDE 0.9% FLUSH
10.0000 mL | INTRAVENOUS | Status: DC | PRN
  Administered 2022-05-02: 10 mL via INTRAVENOUS

## 2022-05-02 MED ORDER — POTASSIUM CHLORIDE 10 MEQ/100ML IV SOLN
10.0000 meq | INTRAVENOUS | Status: AC
  Administered 2022-05-02 (×4): 10 meq via INTRAVENOUS
  Filled 2022-05-02: qty 100

## 2022-05-02 MED ORDER — POTASSIUM CHLORIDE CRYS ER 20 MEQ PO TBCR: 40.0000 meq | EXTENDED_RELEASE_TABLET | Freq: Two times a day (BID) | ORAL | 0 refills | Status: DC

## 2022-05-02 MED ORDER — SODIUM CHLORIDE 0.9 % IV SOLN: 40.0000 meq | Freq: Once | INTRAVENOUS | Status: DC

## 2022-05-02 MED ORDER — HEPARIN SOD (PORK) LOCK FLUSH 100 UNIT/ML IV SOLN
500.0000 [IU] | Freq: Once | INTRAVENOUS | Status: AC
  Administered 2022-05-02: 500 [IU] via INTRAVENOUS

## 2022-05-02 MED ORDER — DEXTROSE 5 % IV SOLN
2.0000 g | Freq: Once | INTRAVENOUS | Status: AC
  Administered 2022-05-02: 2 g via INTRAVENOUS
  Filled 2022-05-02: qty 20

## 2022-05-02 NOTE — Patient Instructions (Signed)

## 2022-05-02 NOTE — Progress Notes (Signed)
Hematology and Oncology Follow Up Visit  Makayla Good [id number] Jun 04, 1970 52 y.o. 05/02/2022   Principle Diagnosis:  Metastatic adenocarcinoma of the pancreas-liver metastasis -- NO actionable mutation   Current Therapy:        FOLFIRINOX -s/p cycle #1 - start on 04/02/2022 XRT to spine -- completed on 03/23/2022   Interim History:  Makayla Good is here today for follow-up. She had a fall after getting out of the shower yesterday. Thankfully she fell across her sink and not into the floor. She denies hitting her head or losing consciousness. She denies needing to go to the ED.  She states that she had taken a hot shower and turned the heat up to it was quite warm in the bathroom and became dizzy.  She still feels fatigued.  CA 19-9 last visit was 236.  She has had a persistent cough with clear phlegm for the last 2-3 weeks.  No fever, chills, n/v, rash, SOB, chest pain, palpitations, abdominal pain or changes in bowel or bladder habits.  No swelling, tenderness, numbness or tingling in her extremities.  Appetite is down and she admits that she has only been drinking a single 33 oz bottle water a day. Her weight is down 10 lbs since her last visit.   ECOG Performance Status: 2 - Symptomatic, <50% confined to bed  Medications:  Allergies as of 05/02/2022   No Known Allergies      Medication List        Accurate as of May 02, 2022  9:04 AM. If you have any questions, ask your nurse or doctor.          STOP taking these medications    ciprofloxacin 500 MG tablet Commonly known as: Cipro Stopped by: Lottie Dawson, NP   potassium chloride 10 MEQ tablet Commonly known as: KLOR-CON Stopped by: Lottie Dawson, NP       TAKE these medications    amLODipine 5 MG tablet Commonly known as: NORVASC TAKE 1 TABLET (5 MG TOTAL) BY MOUTH DAILY.   calcium citrate 950 (200 Ca) MG tablet Commonly known as: CALCITRATE - dosed in mg elemental calcium Take 200  mg of elemental calcium by mouth daily.   CELEBRATE MULTI-COMPLETE 36 PO Take 1 each by mouth daily.   cyclobenzaprine 10 MG tablet Commonly known as: FLEXERIL TAKE 1 TABLET BY MOUTH AT BEDTIME AS NEEDED FOR MUSCLE SPASMS   dexamethasone 4 MG tablet Commonly known as: DECADRON Take 2 tablets (8 mg total) by mouth daily. Take 2 tablets daily x 3 days starting the day after chemotherapy. Take with food.   fluticasone 50 MCG/ACT nasal spray Commonly known as: FLONASE SPRAY 2 SPRAYS INTO EACH NOSTRIL EVERY DAY   hydrochlorothiazide 25 MG tablet Commonly known as: HYDRODIURIL Take 1 tablet (25 mg total) by mouth daily.   lidocaine-prilocaine cream Commonly known as: EMLA Apply to affected area once   loperamide 2 MG capsule Commonly known as: IMODIUM Take 2 tabs by mouth with first loose stool, then 1 tab with each additional loose stool as needed. Do not exceed 8 tabs in a 24-hour period   Melatonin 10 MG Tabs Take 10 mg by mouth at bedtime as needed (sleep).   omeprazole 40 MG capsule Commonly known as: PRILOSEC Take 1 capsule (40 mg total) by mouth daily. What changed:  when to take this reasons to take this   ondansetron 8 MG tablet Commonly known as: Zofran Take 1 tablet (8 mg total) by  mouth every 8 (eight) hours as needed for nausea or vomiting. Start on the third day after cisplatin   Oxycodone HCl 10 MG Tabs Take 1 tablet (10 mg total) by mouth every 6 (six) hours as needed.   potassium chloride SA 20 MEQ tablet Commonly known as: KLOR-CON M Take 2 tablets (40 mEq total) by mouth daily.   prochlorperazine 10 MG tablet Commonly known as: COMPAZINE Take 1 tablet (10 mg total) by mouth every 6 (six) hours as needed for nausea or vomiting (Nausea or vomiting).   temazepam 15 MG capsule Commonly known as: RESTORIL Take 1 capsule (15 mg total) by mouth at bedtime as needed for sleep.        Allergies: No Known Allergies  Past Medical History, Surgical  history, Social history, and Family History were reviewed and updated.  Review of Systems: All other 10 point review of systems is negative.   Physical Exam:  height is '5\' 5"'$  (1.651 m) and weight is 168 lb 6.4 oz (76.4 kg). Her oral temperature is 98.7 F (37.1 C). Her blood pressure is 119/85 and her pulse is 80. Her respiration is 18 and oxygen saturation is 97%.   Wt Readings from Last 3 Encounters:  05/02/22 168 lb 6.4 oz (76.4 kg)  04/18/22 178 lb (80.7 kg)  04/02/22 183 lb 6 oz (83.2 kg)    Ocular: Sclerae unicteric, pupils equal, round and reactive to light Ear-nose-throat: Oropharynx clear, dentition fair Lymphatic: No cervical or supraclavicular adenopathy Lungs no rales or rhonchi, good excursion bilaterally Heart regular rate and rhythm, no murmur appreciated Abd soft, nontender, positive bowel sounds MSK no focal spinal tenderness, no joint edema Neuro: non-focal, well-oriented, appropriate affect Breasts: Deferred   Lab Results  Component Value Date   WBC 2.7 (L) 05/02/2022   HGB 11.0 (L) 05/02/2022   HCT 33.5 (L) 05/02/2022   MCV 94.9 05/02/2022   PLT 202 05/02/2022   No results found for: "FERRITIN", "IRON", "TIBC", "UIBC", "IRONPCTSAT" Lab Results  Component Value Date   RBC 3.53 (L) 05/02/2022   No results found for: "KPAFRELGTCHN", "LAMBDASER", "KAPLAMBRATIO" No results found for: "IGGSERUM", "IGA", "IGMSERUM" No results found for: "TOTALPROTELP", "ALBUMINELP", "A1GS", "A2GS", "BETS", "BETA2SER", "GAMS", "MSPIKE", "SPEI"   Chemistry      Component Value Date/Time   NA 142 04/18/2022 0820   K 2.7 (LL) 04/18/2022 0820   CL 104 04/18/2022 0820   CO2 28 04/18/2022 0820   BUN 10 04/18/2022 0820   CREATININE 0.56 04/18/2022 0820   CREATININE 0.79 12/08/2021 1420      Component Value Date/Time   CALCIUM 8.4 (L) 04/18/2022 0820   ALKPHOS 144 (H) 04/18/2022 0820   AST 20 04/18/2022 0820   ALT 20 04/18/2022 0820   BILITOT 0.5 04/18/2022 0820        Impression and Plan: Makayla Good is a very pleasant 52 yo African American female with metastatic pancreatic adenocarcinoma.  No treatment today per MD.  We will give her a liter of fluids and Rocephin IV  She will go home on daily Omnicef 600 mg PO.  She also needs IV potassium 40 meq today. We will send her home on KDUR 40 meq PO BID for 5 days and then 40 meq PO daily.  Follow-up in 2 weeks.   Lottie Dawson, NP 1/10/20249:04 AM

## 2022-05-02 NOTE — Progress Notes (Unsigned)
Patient has been ill recently with a cough. She also had an episode yesterday where she fell getting out of the shower with no injury. Chest xray done this morning shows possible pneumonia. Will hold treatment. She will get fluids and IV antibiotics.   Oncology Nurse Navigator Documentation     05/02/2022    9:00 AM  Oncology Nurse Navigator Flowsheets  Navigator Follow Up Date: 05/16/2022  Navigator Follow Up Reason: Follow-up Appointment;Chemotherapy  Navigator Location [facility]  Navigator Encounter Type Treatment;Appt/Treatment Plan Review  Patient Visit Type MedOnc  Treatment Phase Active Tx  Barriers/Navigation Needs Coordination of Care;Education  Interventions Psycho-Social Support  Acuity Level 2-Minimal Needs (1-2 Barriers Identified)  Support Groups/Services Friends and Family  Time Spent with Patient 15

## 2022-05-03 ENCOUNTER — Encounter: Payer: BC Managed Care – PPO | Admitting: Genetic Counselor

## 2022-05-03 ENCOUNTER — Other Ambulatory Visit: Payer: BC Managed Care – PPO

## 2022-05-03 ENCOUNTER — Encounter: Payer: Self-pay | Admitting: Hematology & Oncology

## 2022-05-03 DIAGNOSIS — C259 Malignant neoplasm of pancreas, unspecified: Secondary | ICD-10-CM | POA: Diagnosis not present

## 2022-05-03 LAB — CANCER ANTIGEN 19-9: CA 19-9: 159 U/mL — ABNORMAL HIGH (ref 0–35)

## 2022-05-04 ENCOUNTER — Inpatient Hospital Stay: Payer: BC Managed Care – PPO

## 2022-05-06 ENCOUNTER — Emergency Department (HOSPITAL_COMMUNITY): Payer: BC Managed Care – PPO

## 2022-05-06 ENCOUNTER — Inpatient Hospital Stay (HOSPITAL_COMMUNITY)
Admission: EM | Admit: 2022-05-06 | Discharge: 2022-05-12 | DRG: 871 | Disposition: A | Discharge status: Home / Self Care | Payer: BC Managed Care – PPO | Attending: Internal Medicine | Admitting: Internal Medicine

## 2022-05-06 DIAGNOSIS — I2489 Other forms of acute ischemic heart disease: Secondary | ICD-10-CM | POA: Diagnosis not present

## 2022-05-06 DIAGNOSIS — C787 Secondary malignant neoplasm of liver and intrahepatic bile duct: Secondary | ICD-10-CM | POA: Diagnosis present

## 2022-05-06 DIAGNOSIS — C7951 Secondary malignant neoplasm of bone: Secondary | ICD-10-CM | POA: Diagnosis not present

## 2022-05-06 DIAGNOSIS — R059 Cough, unspecified: Secondary | ICD-10-CM | POA: Diagnosis not present

## 2022-05-06 DIAGNOSIS — Z8249 Family history of ischemic heart disease and other diseases of the circulatory system: Secondary | ICD-10-CM

## 2022-05-06 DIAGNOSIS — K219 Gastro-esophageal reflux disease without esophagitis: Secondary | ICD-10-CM | POA: Diagnosis not present

## 2022-05-06 DIAGNOSIS — E8809 Other disorders of plasma-protein metabolism, not elsewhere classified: Secondary | ICD-10-CM | POA: Diagnosis not present

## 2022-05-06 DIAGNOSIS — J90 Pleural effusion, not elsewhere classified: Secondary | ICD-10-CM | POA: Diagnosis not present

## 2022-05-06 DIAGNOSIS — R000 Tachycardia, unspecified: Secondary | ICD-10-CM | POA: Diagnosis not present

## 2022-05-06 DIAGNOSIS — Z87891 Personal history of nicotine dependence: Secondary | ICD-10-CM

## 2022-05-06 DIAGNOSIS — Z1152 Encounter for screening for COVID-19: Secondary | ICD-10-CM

## 2022-05-06 DIAGNOSIS — I10 Essential (primary) hypertension: Secondary | ICD-10-CM | POA: Diagnosis not present

## 2022-05-06 DIAGNOSIS — E669 Obesity, unspecified: Secondary | ICD-10-CM | POA: Diagnosis not present

## 2022-05-06 DIAGNOSIS — T502X5A Adverse effect of carbonic-anhydrase inhibitors, benzothiadiazides and other diuretics, initial encounter: Secondary | ICD-10-CM | POA: Diagnosis not present

## 2022-05-06 DIAGNOSIS — C259 Malignant neoplasm of pancreas, unspecified: Secondary | ICD-10-CM | POA: Diagnosis present

## 2022-05-06 DIAGNOSIS — D500 Iron deficiency anemia secondary to blood loss (chronic): Secondary | ICD-10-CM | POA: Diagnosis not present

## 2022-05-06 DIAGNOSIS — A419 Sepsis, unspecified organism: Principal | ICD-10-CM

## 2022-05-06 DIAGNOSIS — Z820 Family history of epilepsy and other diseases of the nervous system: Secondary | ICD-10-CM

## 2022-05-06 DIAGNOSIS — D638 Anemia in other chronic diseases classified elsewhere: Secondary | ICD-10-CM | POA: Diagnosis not present

## 2022-05-06 DIAGNOSIS — Z818 Family history of other mental and behavioral disorders: Secondary | ICD-10-CM

## 2022-05-06 DIAGNOSIS — Z79899 Other long term (current) drug therapy: Secondary | ICD-10-CM | POA: Diagnosis not present

## 2022-05-06 DIAGNOSIS — Z6829 Body mass index (BMI) 29.0-29.9, adult: Secondary | ICD-10-CM | POA: Diagnosis not present

## 2022-05-06 DIAGNOSIS — R918 Other nonspecific abnormal finding of lung field: Secondary | ICD-10-CM | POA: Diagnosis not present

## 2022-05-06 DIAGNOSIS — J45909 Unspecified asthma, uncomplicated: Secondary | ICD-10-CM | POA: Diagnosis not present

## 2022-05-06 DIAGNOSIS — E876 Hypokalemia: Secondary | ICD-10-CM | POA: Diagnosis not present

## 2022-05-06 DIAGNOSIS — E785 Hyperlipidemia, unspecified: Secondary | ICD-10-CM | POA: Diagnosis present

## 2022-05-06 DIAGNOSIS — J189 Pneumonia, unspecified organism: Secondary | ICD-10-CM | POA: Diagnosis not present

## 2022-05-06 DIAGNOSIS — Z8261 Family history of arthritis: Secondary | ICD-10-CM

## 2022-05-06 DIAGNOSIS — G4733 Obstructive sleep apnea (adult) (pediatric): Secondary | ICD-10-CM | POA: Diagnosis present

## 2022-05-06 DIAGNOSIS — R0602 Shortness of breath: Secondary | ICD-10-CM | POA: Diagnosis not present

## 2022-05-06 DIAGNOSIS — E663 Overweight: Secondary | ICD-10-CM | POA: Diagnosis present

## 2022-05-06 LAB — COMPREHENSIVE METABOLIC PANEL
ALT: 26 U/L (ref 0–44)
AST: 25 U/L (ref 15–41)
Albumin: 2.8 g/dL — ABNORMAL LOW (ref 3.5–5.0)
Alkaline Phosphatase: 219 U/L — ABNORMAL HIGH (ref 38–126)
Anion gap: 11 (ref 5–15)
BUN: 6 mg/dL (ref 6–20)
CO2: 20 mmol/L — ABNORMAL LOW (ref 22–32)
Calcium: 8.4 mg/dL — ABNORMAL LOW (ref 8.9–10.3)
Chloride: 109 mmol/L (ref 98–111)
Creatinine, Ser: 0.57 mg/dL (ref 0.44–1.00)
GFR, Estimated: >60 mL/min (ref >60)
Glucose, Bld: 115 mg/dL — ABNORMAL HIGH (ref 70–99)
Potassium: 3.2 mmol/L — ABNORMAL LOW (ref 3.5–5.1)
Sodium: 140 mmol/L (ref 135–145)
Total Bilirubin: 0.7 mg/dL (ref 0.3–1.2)
Total Protein: 6 g/dL — ABNORMAL LOW (ref 6.5–8.1)

## 2022-05-06 LAB — URINALYSIS, ROUTINE W REFLEX MICROSCOPIC
Bilirubin Urine: NEGATIVE
Glucose, UA: NEGATIVE mg/dL
Hgb urine dipstick: NEGATIVE
Ketones, ur: NEGATIVE mg/dL
Leukocytes,Ua: NEGATIVE
Nitrite: NEGATIVE
Protein, ur: NEGATIVE mg/dL
Specific Gravity, Urine: 1.012 (ref 1.005–1.030)
pH: 6 (ref 5.0–8.0)

## 2022-05-06 LAB — CBC WITH DIFFERENTIAL/PLATELET
Abs Immature Granulocytes: 0.2 10*3/uL — ABNORMAL HIGH (ref 0.00–0.07)
Basophils Absolute: 0 10*3/uL (ref 0.0–0.1)
Basophils Relative: 0 %
Eosinophils Absolute: 0 10*3/uL (ref 0.0–0.5)
Eosinophils Relative: 0 %
HCT: 36.2 % (ref 36.0–46.0)
Hemoglobin: 11.7 g/dL — ABNORMAL LOW (ref 12.0–15.0)
Immature Granulocytes: 2 %
Lymphocytes Relative: 4 %
Lymphs Abs: 0.4 10*3/uL — ABNORMAL LOW (ref 0.7–4.0)
MCH: 31.5 pg (ref 26.0–34.0)
MCHC: 32.3 g/dL (ref 30.0–36.0)
MCV: 97.3 fL (ref 80.0–100.0)
Monocytes Absolute: 1.5 10*3/uL — ABNORMAL HIGH (ref 0.1–1.0)
Monocytes Relative: 12 %
Neutro Abs: 10.3 10*3/uL — ABNORMAL HIGH (ref 1.7–7.7)
Neutrophils Relative %: 82 %
Platelets: 184 10*3/uL (ref 150–400)
RBC: 3.72 MIL/uL — ABNORMAL LOW (ref 3.87–5.11)
RDW: 18 % — ABNORMAL HIGH (ref 11.5–15.5)
WBC: 12.5 10*3/uL — ABNORMAL HIGH (ref 4.0–10.5)
nRBC: 0.2 % (ref 0.0–0.2)

## 2022-05-06 LAB — RESP PANEL BY RT-PCR (RSV, FLU A&B, COVID)  RVPGX2
Influenza A by PCR: NEGATIVE
Influenza B by PCR: NEGATIVE
Resp Syncytial Virus by PCR: NEGATIVE
SARS Coronavirus 2 by RT PCR: NEGATIVE

## 2022-05-06 LAB — PROCALCITONIN: Procalcitonin: <0.1 ng/mL

## 2022-05-06 LAB — TROPONIN I (HIGH SENSITIVITY)
Troponin I (High Sensitivity): 17 ng/L (ref <18)
Troponin I (High Sensitivity): 18 ng/L — ABNORMAL HIGH (ref <18)

## 2022-05-06 LAB — PROTIME-INR
INR: 1.3 — ABNORMAL HIGH (ref 0.8–1.2)
Prothrombin Time: 15.6 seconds — ABNORMAL HIGH (ref 11.4–15.2)

## 2022-05-06 LAB — LACTIC ACID, PLASMA: Lactic Acid, Venous: 1.3 mmol/L (ref 0.5–1.9)

## 2022-05-06 LAB — PHOSPHORUS: Phosphorus: 2.9 mg/dL (ref 2.5–4.6)

## 2022-05-06 LAB — MAGNESIUM: Magnesium: 1.5 mg/dL — ABNORMAL LOW (ref 1.7–2.4)

## 2022-05-06 MED ORDER — ACETAMINOPHEN 325 MG PO TABS
650.0000 mg | ORAL_TABLET | Freq: Once | ORAL | Status: AC | PRN
  Administered 2022-05-06: 650 mg via ORAL
  Filled 2022-05-06: qty 2

## 2022-05-06 MED ORDER — PANTOPRAZOLE SODIUM 40 MG PO TBEC: 40.0000 mg | DELAYED_RELEASE_TABLET | Freq: Every day | ORAL | Status: DC | PRN

## 2022-05-06 MED ORDER — GUAIFENESIN ER 600 MG PO TB12
600.0000 mg | ORAL_TABLET | Freq: Two times a day (BID) | ORAL | Status: DC
  Administered 2022-05-06 – 2022-05-07 (×3): 600 mg via ORAL
  Filled 2022-05-06 (×3): qty 1

## 2022-05-06 MED ORDER — HYDROCODONE BIT-HOMATROP MBR 5-1.5 MG/5ML PO SOLN: 5.0000 mL | ORAL | Status: DC | PRN

## 2022-05-06 MED ORDER — POTASSIUM CHLORIDE CRYS ER 20 MEQ PO TBCR
40.0000 meq | EXTENDED_RELEASE_TABLET | Freq: Once | ORAL | Status: AC
  Administered 2022-05-06: 40 meq via ORAL
  Filled 2022-05-06: qty 2

## 2022-05-06 MED ORDER — MAGNESIUM SULFATE 2 GM/50ML IV SOLN
2.0000 g | Freq: Once | INTRAVENOUS | Status: AC
  Administered 2022-05-06: 2 g via INTRAVENOUS
  Filled 2022-05-06: qty 50

## 2022-05-06 MED ORDER — LACTATED RINGERS IV SOLN: INTRAVENOUS | Status: AC

## 2022-05-06 MED ORDER — OXYCODONE HCL 5 MG PO TABS
10.0000 mg | ORAL_TABLET | Freq: Four times a day (QID) | ORAL | Status: DC | PRN
  Administered 2022-05-06 – 2022-05-12 (×16): 10 mg via ORAL
  Filled 2022-05-06 (×16): qty 2

## 2022-05-06 MED ORDER — SODIUM CHLORIDE 0.9 % IV SOLN
2.0000 g | Freq: Three times a day (TID) | INTRAVENOUS | Status: DC
  Administered 2022-05-06 – 2022-05-10 (×12): 2 g via INTRAVENOUS
  Filled 2022-05-06 (×12): qty 12.5

## 2022-05-06 MED ORDER — SODIUM CHLORIDE 0.9 % IV SOLN
2.0000 g | Freq: Once | INTRAVENOUS | Status: AC
  Administered 2022-05-06: 2 g via INTRAVENOUS
  Filled 2022-05-06: qty 12.5

## 2022-05-06 MED ORDER — ACETAMINOPHEN 325 MG PO TABS
650.0000 mg | ORAL_TABLET | Freq: Four times a day (QID) | ORAL | Status: DC | PRN
  Administered 2022-05-08 – 2022-05-09 (×2): 650 mg via ORAL
  Filled 2022-05-06 (×2): qty 2

## 2022-05-06 MED ORDER — LACTATED RINGERS IV BOLUS (SEPSIS)
1000.0000 mL | Freq: Once | INTRAVENOUS | Status: AC
  Administered 2022-05-06: 1000 mL via INTRAVENOUS

## 2022-05-06 MED ORDER — ACETAMINOPHEN 650 MG RE SUPP: 650.0000 mg | Freq: Four times a day (QID) | RECTAL | Status: DC | PRN

## 2022-05-06 MED ORDER — IPRATROPIUM-ALBUTEROL 0.5-2.5 (3) MG/3ML IN SOLN
3.0000 mL | RESPIRATORY_TRACT | Status: DC | PRN
  Administered 2022-05-06: 3 mL via RESPIRATORY_TRACT
  Filled 2022-05-06: qty 3

## 2022-05-06 MED ORDER — PROCHLORPERAZINE EDISYLATE 10 MG/2ML IJ SOLN: 10.0000 mg | Freq: Four times a day (QID) | INTRAMUSCULAR | Status: DC | PRN

## 2022-05-06 MED ORDER — MELATONIN 3 MG PO TABS: 3.0000 mg | ORAL_TABLET | Freq: Every evening | ORAL | Status: DC | PRN

## 2022-05-06 MED ORDER — SODIUM CHLORIDE 0.9 % IV SOLN
500.0000 mg | INTRAVENOUS | Status: AC
  Administered 2022-05-06 – 2022-05-10 (×5): 500 mg via INTRAVENOUS
  Filled 2022-05-06 (×5): qty 5

## 2022-05-06 MED ORDER — SODIUM CHLORIDE 0.9 % IV SOLN: 2.0000 g | INTRAVENOUS | Status: DC

## 2022-05-06 NOTE — ED Provider Notes (Signed)
[place] DEPT Provider Note  CSN: [id number] Arrival date & time: 05/06/22 3546  Chief Complaint(s) No chief complaint on file.  HPI Makayla Good is a 52 y.o. female with past medical history as below, significant for pancreatic carcinoma currently under chemotherapy Dr. [name] last chemotherapy proximately 2 weeks ago; hypertension, obesity, asthma who presents to the ED with complaint of difficulty breathing, cough, fever, malaise.  Was recently diagnosed with pneumonia approximately 4 days ago.  She was started on oral antibiotics which she has been compliant with taking.  Her symptoms have worsened.  Fevers worsen.  Tmax prior to arrival 102.  Increased difficulty with breathing primarily with exertion.  Poor p.o. intake due to poor appetite.  Body aches, chills.  No nausea or vomiting.  No change in bowel or bladder function.  Past Medical History Past Medical History:  Diagnosis Date   Asthma    Eczema    Fibroid    H/O oophorectomy    Hypertension    Obesity    Pancreatic adenocarcinoma ([place]) 03/01/2022   Polycystic ovary    Left   Sleep apnea    no CPAP per MD   Patient Active Problem List   Diagnosis Date Noted   CAP (community acquired pneumonia) 05/06/2022   Genetic testing 03/29/2022   Pancreatic cancer ([place]) 03/06/2022   Metastatic cancer to spine ([place]) 03/05/2022   Leg pain, bilateral 03/05/2022   Insomnia 03/05/2022   Pancreatic adenocarcinoma ([place]) 03/01/2022   Varicose veins of right lower extremity with pain 12/08/2021   Multinodular goiter 10/25/2020   Anxiety 10/14/2020   Increased frequency of headaches 09/05/2020   Abnormal CT of the head 09/05/2020   Loud snoring 09/05/2020   Gastroesophageal reflux disease 05/05/2020   Hyperglycemia 05/05/2020   Lower extremity edema 09/22/2019   Lymphedema 09/22/2019   HTN (hypertension) 08/05/2017   Hypokalemia 08/05/2017   OSA (obstructive sleep apnea)  03/05/2017   Asthma 03/05/2017   Dizziness 02/08/2017   Preventative health care 02/08/2017   Abdominal pain 07/05/2016   RUQ pain 07/05/2016   Irregular periods 07/26/2014   Dysuria 02/04/2014   Severe obesity (BMI >= 40) ([place]) 10/21/2012   OA (osteoarthritis) of knee 06/04/2012   Cough 03/05/2012   Plantar fasciitis 09/04/2010   HYPOKALEMIA 07/01/2009   CHEST PAIN UNSPECIFIED 05/18/2009   DEPRESSION 03/07/2009   FATIGUE 03/07/2009   TOBACCO USER 12/03/2008   Morbid obesity ([place]) 05/21/2008   HLD (hyperlipidemia) 56/81/2751   DYSMETABOLIC SYNDROME X 70/04/7492   CONJUNCTIVITIS NOS 02/04/2007   VAGINITIS NOS 11/07/2006   FUNGAL DERMATITIS 10/09/2006   Benign essential HTN 10/09/2006   Home Medication(s) Prior to Admission medications   Medication Sig Start Date End Date Taking? Authorizing Provider  amLODipine (NORVASC) 5 MG tablet TAKE 1 TABLET (5 MG TOTAL) BY MOUTH DAILY. 03/27/22  Yes Roma Schanz R, DO  cefdinir (OMNICEF) 300 MG capsule Take 2 capsules (600 mg total) by mouth daily. 05/02/22  Yes Celso Amy, NP  cyclobenzaprine (FLEXERIL) 10 MG tablet TAKE 1 TABLET BY MOUTH AT BEDTIME AS NEEDED FOR MUSCLE SPASMS 03/08/22  Yes Barnet Pall E, NP  dexamethasone (DECADRON) 4 MG tablet Take 2 tablets (8 mg total) by mouth daily. Take 2 tablets daily x 3 days starting the day after chemotherapy. Take with food. 03/13/22  Yes Volanda Napoleon, MD  fluticasone (FLONASE) 50 MCG/ACT nasal spray SPRAY 2 SPRAYS INTO EACH NOSTRIL EVERY DAY Patient taking differently: Place 1 spray into  both nostrils daily as needed for allergies. 05/11/21  Yes Roma Schanz R, DO  hydrochlorothiazide (HYDRODIURIL) 25 MG tablet Take 1 tablet (25 mg total) by mouth daily. 12/29/21  Yes Roma Schanz R, DO  loperamide (IMODIUM) 2 MG capsule Take 2 tabs by mouth with first loose stool, then 1 tab with each additional loose stool as needed. Do not exceed 8 tabs in a 24-hour period 03/13/22   Yes Ennever, Rudell Cobb, MD  omeprazole (PRILOSEC) 40 MG capsule Take 1 capsule (40 mg total) by mouth daily. Patient taking differently: Take 40 mg by mouth daily as needed (acid reflux). 01/22/22  Yes Martinique, Betty G, MD  ondansetron (ZOFRAN) 8 MG tablet Take 1 tablet (8 mg total) by mouth every 8 (eight) hours as needed for nausea or vomiting. Start on the third day after cisplatin 03/13/22  Yes Ennever, Rudell Cobb, MD  Oxycodone HCl 10 MG TABS Take 1 tablet (10 mg total) by mouth every 6 (six) hours as needed. Patient taking differently: Take 10 mg by mouth every 6 (six) hours as needed (pain). 03/21/22  Yes Ennever, Rudell Cobb, MD  potassium chloride SA (KLOR-CON M) 20 MEQ tablet Take 2 tablets (40 mEq total) by mouth 2 (two) times daily. 40 meq PO BID for 5 days and then 40 meq PO daily. 05/02/22  Yes Celso Amy, NP  prochlorperazine (COMPAZINE) 10 MG tablet Take 1 tablet (10 mg total) by mouth every 6 (six) hours as needed for nausea or vomiting (Nausea or vomiting). 03/13/22  Yes Volanda Napoleon, MD  temazepam (RESTORIL) 15 MG capsule Take 1 capsule (15 mg total) by mouth at bedtime as needed for sleep. 02/28/22  Yes Volanda Napoleon, MD  lidocaine-prilocaine (EMLA) cream Apply to affected area once 03/13/22   Volanda Napoleon, MD                                                                                                                                    Past Surgical History Past Surgical History:  Procedure Laterality Date   BACK SURGERY     38yr ago   BREAST BIOPSY Right 2019   fibroadenoma   CESAREAN SECTION  02/01/2005   CESAREAN SECTION  2001   IR IMAGING GUIDED PORT INSERTION  03/07/2022   LAPAROSCOPIC GASTRIC SLEEVE RESECTION N/A 07/29/2017   Procedure: LAPAROSCOPIC GASTRIC SLEEVE RESECTION WITH UPPER ENDO ;  Surgeon: CClovis Riley MD;  Location: [facility];  Service: General;  Laterality: N/A;   OOPHORECTOMY     Rt.removed   TUBAL LIGATION  02/01/2005   Family  History Family History  Problem Relation Age of Onset   Hypertension Mother    Schizophrenia Mother    Dementia Mother    Mental illness Mother        schizophrenia, dementia   Hypertension Father    Coronary artery disease Father  Stent   Alzheimer's disease Father    Hypertension Sister    Hypertension Sister    Arthritis Sister        rheumatoid   Dementia Maternal Aunt    Kidney disease Other    Colon cancer Neg Hx    Stomach cancer Neg Hx    Pancreatic cancer Neg Hx    Esophageal cancer Neg Hx    Rectal cancer Neg Hx    Breast cancer Neg Hx     Social History Social History   Tobacco Use   Smoking status: Former    Packs/day: 0.50    Years: 30.00    Total pack years: 15.00    Types: Cigarettes, E-cigarettes    Start date: 04/28/2020    Quit date: 2022    Years since quitting: 2.0   Smokeless tobacco: Never   Tobacco comments:    VAPES  Vaping Use   Vaping Use: Every day   Substances: Nicotine  Substance Use Topics   Alcohol use: Yes    Comment: occasional   Drug use: No   Allergies Patient has no known allergies.  Review of Systems Review of Systems  Constitutional:  Positive for chills and fever. Negative for activity change.  HENT:  Negative for facial swelling and trouble swallowing.   Eyes:  Negative for discharge and redness.  Respiratory:  Positive for cough, chest tightness and shortness of breath.   Cardiovascular:  Negative for chest pain and palpitations.  Gastrointestinal:  Negative for abdominal pain and nausea.  Genitourinary:  Negative for dysuria and flank pain.  Musculoskeletal:  Positive for arthralgias. Negative for back pain and gait problem.  Skin:  Negative for pallor and rash.  Neurological:  Negative for syncope and headaches.    Physical Exam Vital Signs  I have reviewed the triage vital signs BP 99/65 (BP Location: Right Arm)   Pulse (!) 140   Temp 98.2 F (36.8 C) (Oral)   Resp (!) 24   LMP 07/27/2020 (Within  Days)   SpO2 94%  Physical Exam Vitals and nursing note reviewed. Exam conducted with a chaperone present.  Constitutional:      General: She is not in acute distress.    Appearance: Normal appearance.  HENT:     Head: Normocephalic and atraumatic.     Right Ear: External ear normal.     Left Ear: External ear normal.     Nose: Nose normal.     Mouth/Throat:     Mouth: Mucous membranes are moist.  Eyes:     General: No scleral icterus.       Right eye: No discharge.        Left eye: No discharge.  Cardiovascular:     Rate and Rhythm: Regular rhythm. Tachycardia present.     Pulses: Normal pulses.     Heart sounds: Normal heart sounds.  Pulmonary:     Effort: Pulmonary effort is normal. Tachypnea present. No respiratory distress.     Breath sounds: No stridor.     Comments: Adventitious breath sounds bilateral Chest:    Abdominal:     General: Abdomen is flat.     Palpations: Abdomen is soft.     Tenderness: There is no abdominal tenderness.  Musculoskeletal:        General: Normal range of motion.     Cervical back: Normal range of motion.     Right lower leg: No edema.     Left lower leg: No edema.  Skin:    General: Skin is warm and dry.     Capillary Refill: Capillary refill takes less than 2 seconds.  Neurological:     Mental Status: She is alert and oriented to person, place, and time.     GCS: GCS eye subscore is 4. GCS verbal subscore is 5. GCS motor subscore is 6.  Psychiatric:        Mood and Affect: Mood normal.        Behavior: Behavior normal.     ED Results and Treatments Labs (all labs ordered are listed, but only abnormal results are displayed) Labs Reviewed  COMPREHENSIVE METABOLIC PANEL - Abnormal; Notable for the following components:      Result Value   Potassium 3.2 (*)    CO2 20 (*)    Glucose, Bld 115 (*)    Calcium 8.4 (*)    Total Protein 6.0 (*)    Albumin 2.8 (*)    Alkaline Phosphatase 219 (*)    All other components within  normal limits  CBC WITH DIFFERENTIAL/PLATELET - Abnormal; Notable for the following components:   WBC 12.5 (*)    RBC 3.72 (*)    Hemoglobin 11.7 (*)    RDW 18.0 (*)    Neutro Abs 10.3 (*)    Lymphs Abs 0.4 (*)    Monocytes Absolute 1.5 (*)    Abs Immature Granulocytes 0.20 (*)    All other components within normal limits  PROTIME-INR - Abnormal; Notable for the following components:   Prothrombin Time 15.6 (*)    INR 1.3 (*)    All other components within normal limits  MAGNESIUM - Abnormal; Notable for the following components:   Magnesium 1.5 (*)    All other components within normal limits  TROPONIN I (HIGH SENSITIVITY) - Abnormal; Notable for the following components:   Troponin I (High Sensitivity) 18 (*)    All other components within normal limits  CULTURE, BLOOD (ROUTINE X 2)  CULTURE, BLOOD (ROUTINE X 2)  RESP PANEL BY RT-PCR (RSV, FLU A&B, COVID)  RVPGX2  LACTIC ACID, PLASMA  PHOSPHORUS  URINALYSIS, ROUTINE W REFLEX MICROSCOPIC  PROCALCITONIN  TROPONIN I (HIGH SENSITIVITY)                                                                                                                          Radiology DG Chest 2 View  Result Date: 05/06/2022 CLINICAL DATA:  52 year old female with suspected sepsis. Cough. Metastatic pancreatic cancer. EXAM: CHEST - 2 VIEW COMPARISON:  Chest radiographs 05/02/2022 and earlier. FINDINGS: Stable right chest Port-A-Cath. Continued somewhat low lung volumes. Increasing and now confluent left lung base opacity obscuring the diaphragm. Air bronchograms now. Streaky opacity there 4 days ago. No superimposed pneumothorax or pulmonary edema. Right lung appears stable and negative. Visible mediastinal contours remain within normal limits. Visualized tracheal air column is within normal limits. No acute osseous abnormality identified. Negative visible bowel gas. IMPRESSION: 1. Progressive lower left  lung consolidation since 05/02/2022. This is  nonspecific but consider Pneumonia. 2. Right lung within normal limits.  Stable right chest Port-A-Cath. Electronically Signed   By: Genevie Ann M.D.   On: 05/06/2022 04:25    Pertinent labs & imaging results that were available during my care of the patient were reviewed by me and considered in my medical decision making (see MDM for details).  Medications Ordered in ED Medications  lactated ringers infusion ( Intravenous New Bag/Given 05/06/22 1660)  azithromycin (ZITHROMAX) 500 mg in sodium chloride 0.9 % 250 mL IVPB (0 mg Intravenous Stopped 05/06/22 0738)  ceFEPIme (MAXIPIME) 2 g in sodium chloride 0.9 % 100 mL IVPB (has no administration in time range)  acetaminophen (TYLENOL) tablet 650 mg (has no administration in time range)    Or  acetaminophen (TYLENOL) suppository 650 mg (has no administration in time range)  melatonin tablet 3 mg (has no administration in time range)  magnesium sulfate IVPB 2 g 50 mL (has no administration in time range)  potassium chloride SA (KLOR-CON M) CR tablet 40 mEq (has no administration in time range)  acetaminophen (TYLENOL) tablet 650 mg (650 mg Oral Given 05/06/22 0424)  lactated ringers bolus 1,000 mL (1,000 mLs Intravenous New Bag/Given 05/06/22 0607)  ceFEPIme (MAXIPIME) 2 g in sodium chloride 0.9 % 100 mL IVPB (0 g Intravenous Stopped 05/06/22 0738)  potassium chloride SA (KLOR-CON M) CR tablet 40 mEq (40 mEq Oral Given 05/06/22 0611)                                                                                                                                     Procedures .Critical Care  Performed by: Jeanell Sparrow, DO Authorized by: Jeanell Sparrow, DO   Critical care provider statement:    Critical care time (minutes):  30   Critical care time was exclusive of:  Separately billable procedures and treating other patients   Critical care was necessary to treat or prevent imminent or life-threatening deterioration of the following conditions:   Sepsis   Critical care was time spent personally by me on the following activities:  Development of treatment plan with patient or surrogate, discussions with consultants, evaluation of patient's response to treatment, examination of patient, ordering and review of laboratory studies, ordering and review of radiographic studies, ordering and performing treatments and interventions, pulse oximetry, re-evaluation of patient's condition and review of old charts   (including critical care time)  Medical Decision Making / ED Course   MDM:  Lunah Losasso is a 52 y.o. female with past medical history as below, significant for pancreatic carcinoma currently under chemotherapy Dr. [name] last chemotherapy proximately 2 weeks ago; hypertension, obesity, asthma who presents to the ED with complaint of difficulty breathing, cough, fever, malaise . The complaint involves an extensive differential diagnosis and also carries with it a high risk of complications and morbidity.  Serious  etiology was considered. Ddx includes but is not limited to: Differential diagnosis for adult fever includes but is not exclusive to community-acquired pneumonia, urinary tract infection, acute cholecystitis, viral syndrome, cellulitis, tick bourne disease,  decubitus ulcer, necrotizing fasciitis, meningitis, encephalitis, influenza, etc.  In my evaluation of this patient's dyspnea my DDx includes, but is not limited to, pneumonia, pulmonary embolism, pneumothorax, pulmonary edema, metabolic acidosis, asthma, COPD, cardiac cause, anemia, anxiety, etc.    On initial assessment the patient is: Resting comfortably, she is tachycardic, tachypneic, febrile. Vital signs and nursing notes were reviewed  Clinical Course as of 05/06/22 1610  Nancy Fetter May 06, 2022  0518 Labs reviewed, she has mild hypokalemia which was replaced orally.    Leukocytosis 12.5, febrile, tachycardic, tachypneic.  Pneumonia on chest x-ray, concern  for sepsis.  Meets criteria for sepsis activation.  Send blood cultures, start cefepime azithromycin given recent failure of outpatient biotics.  Chemotherapy.  Immuno- compromised [SG]    Clinical Course User Index [SG] Jeanell Sparrow, DO   Vital signs improved with initial bolus of IVF, LA is not elev, not septic shock  Fever improved after apap  Trop leak (18), EKG wnl, favor demand ischemia from sepsis, above.   Patient with history of pancreatic carcinoma on chemotherapy.  Missed chemo session on Wednesday secondary to pneumonia diagnosis.  Last chemo session was 2 weeks ago from Wednesday.  Patient failed outpatient antibiotics recommend admission for IV antibiotics, sepsis, treatment of her pneumonia.  She is agreeable.  TRH access patient for admission   Dr [name] notified of admission    Additional history obtained: -Additional history obtained from family -External records from outside source obtained and reviewed including: Chart review including previous notes, labs, imaging, consultation notes including recent admission, primary care documentation, prior labs and imaging, home medications   Lab Tests: -I ordered, reviewed, and interpreted labs.   The pertinent results include:   Labs Reviewed  COMPREHENSIVE METABOLIC PANEL - Abnormal; Notable for the following components:      Result Value   Potassium 3.2 (*)    CO2 20 (*)    Glucose, Bld 115 (*)    Calcium 8.4 (*)    Total Protein 6.0 (*)    Albumin 2.8 (*)    Alkaline Phosphatase 219 (*)    All other components within normal limits  CBC WITH DIFFERENTIAL/PLATELET - Abnormal; Notable for the following components:   WBC 12.5 (*)    RBC 3.72 (*)    Hemoglobin 11.7 (*)    RDW 18.0 (*)    Neutro Abs 10.3 (*)    Lymphs Abs 0.4 (*)    Monocytes Absolute 1.5 (*)    Abs Immature Granulocytes 0.20 (*)    All other components within normal limits  PROTIME-INR - Abnormal; Notable for the following components:    Prothrombin Time 15.6 (*)    INR 1.3 (*)    All other components within normal limits  MAGNESIUM - Abnormal; Notable for the following components:   Magnesium 1.5 (*)    All other components within normal limits  TROPONIN I (HIGH SENSITIVITY) - Abnormal; Notable for the following components:   Troponin I (High Sensitivity) 18 (*)    All other components within normal limits  CULTURE, BLOOD (ROUTINE X 2)  CULTURE, BLOOD (ROUTINE X 2)  RESP PANEL BY RT-PCR (RSV, FLU A&B, COVID)  RVPGX2  LACTIC ACID, PLASMA  PHOSPHORUS  URINALYSIS, ROUTINE W REFLEX MICROSCOPIC  PROCALCITONIN  TROPONIN I (HIGH SENSITIVITY)  Notable for as above  EKG   EKG Interpretation  Date/Time:  Sunday May 06 2022 03:51:22 EST Ventricular Rate:  136 PR Interval:  134 QRS Duration: 71 QT Interval:  319 QTC Calculation: 480 R Axis:   36 Text Interpretation: Sinus tachycardia Probable left atrial enlargement Borderline repol abnormality, diffuse leads rate increased from prior no stemi Confirmed by Wynona Dove ([id number]) on 05/06/2022 5:43:17 AM         Imaging Studies ordered: I ordered imaging studies including cXR I independently visualized the following imaging with scope of interpretation limited to determining acute life threatening conditions related to emergency care: PNA , worse I independently visualized and interpreted imaging. I agree with the radiologist interpretation   Medicines ordered and prescription drug management: Meds ordered this encounter  Medications   acetaminophen (TYLENOL) tablet 650 mg   lactated ringers infusion   lactated ringers bolus 1,000 mL    Order Specific Question:   Reason 30 mL/kg dose is not being ordered    Answer:   First Lactic Acid Pending   DISCONTD: cefTRIAXone (ROCEPHIN) 2 g in sodium chloride 0.9 % 100 mL IVPB    Order Specific Question:   Antibiotic Indication:    Answer:   CAP   azithromycin (ZITHROMAX) 500 mg in sodium chloride 0.9 % 250 mL IVPB     Order Specific Question:   Antibiotic Indication:    Answer:   CAP   ceFEPIme (MAXIPIME) 2 g in sodium chloride 0.9 % 100 mL IVPB    Order Specific Question:   Antibiotic Indication:    Answer:   Sepsis   ceFEPIme (MAXIPIME) 2 g in sodium chloride 0.9 % 100 mL IVPB    Order Specific Question:   Antibiotic Indication:    Answer:   Sepsis   potassium chloride SA (KLOR-CON M) CR tablet 40 mEq   OR Linked Order Group    acetaminophen (TYLENOL) tablet 650 mg    acetaminophen (TYLENOL) suppository 650 mg   melatonin tablet 3 mg   magnesium sulfate IVPB 2 g 50 mL   potassium chloride SA (KLOR-CON M) CR tablet 40 mEq    -I have reviewed the patients home medicines and have made adjustments as needed   Consultations Obtained: na   Cardiac Monitoring: The patient was maintained on a cardiac monitor.  I personally viewed and interpreted the cardiac monitored which showed an underlying rhythm of: sinus tachy  Social Determinants of Health:  Diagnosis or treatment significantly limited by social determinants of health: former smoker   Reevaluation: After the interventions noted above, I reevaluated the patient and found that they have improved  Co morbidities that complicate the patient evaluation  Past Medical History:  Diagnosis Date   Asthma    Eczema    Fibroid    H/O oophorectomy    Hypertension    Obesity    Pancreatic adenocarcinoma ([place]) 03/01/2022   Polycystic ovary    Left   Sleep apnea    no CPAP per MD      Dispostion: Disposition decision including need for hospitalization was considered, and patient admitted to the hospital.    Final Clinical Impression(s) / ED Diagnoses Final diagnoses:  Sepsis, due to unspecified organism, unspecified whether acute organ dysfunction present [facility])  Community acquired pneumonia, unspecified laterality  Hypokalemia  Pancreatic adenocarcinoma ([place])     This chart was dictated using voice recognition software.   Despite best efforts to proofread,  errors can occur which can change  the documentation meaning.    Jeanell Sparrow, DO 05/06/22 [phone]

## 2022-05-06 NOTE — ED Triage Notes (Signed)
Pt presents from home for worsening CP and SOB associated with recent diagnosis of PNA on Wed. Still taking abx CP worse with deep breath.  Also endorses fever, 100.23F in triage, and a productively clear and red-tinged.  H/o pancreatic cancer for which she is currently receiving tx

## 2022-05-06 NOTE — H&P (Signed)
Carryover admission to the Day Admitter.  I discussed this case with the EDP, Dr. Wynona Dove.  Per these discussions:  This is a 52 year old female with pancreatic adenocarcinoma, undergoing chemotherapy, who is being admitted with community-acquired pneumonia associated with failure of outpatient antibiotics.  She was diagnosed with pneumonia per outpatient chest x-ray on 05/02/2022, at which time she was started on Omnicef.  However, in spite of good interval compliance of this antibiotic, she has noted further progression in her productive cough associated with yellow/brown sputum as well as some mild shortness of breath.  It is noted that she last underwent chemotherapy for pancreatic adenocarcinoma on 04/18/2022.   Presenting vital signs notable for mild tachycardia with initial soft blood pressures, both of which are improving with IV fluids.  Labs notable for mild leukocytosis with white blood cell count 12,500, lactic acid level 1.3.  Chest x-ray shows interval worsening of the pneumonia identified on outpatient chest x-ray from 05/02/2022.   Started on cefepime and azithromycin in the emergency department this evening.   I have placed an order for inpatient admission for further evaluation management of the above.  I have placed some additional preliminary admit orders via the adult multi-morbid admission order set. I have also ordered continuation of cefepime and azithromycin, added on serum magnesium and phosphorus levels and ordered flutter valve, incentive spirometry.  Also ordered procalcitonin level and continued existing LR running at 100 cc/h.  Of note, Dr. Pearline Cables has notified patient's oncologist, Dr. [name], that patient is being admitted.      Babs Bertin, DO Hospitalist

## 2022-05-06 NOTE — Sepsis Progress Note (Signed)
[facility] following for Sepsis Protocol 

## 2022-05-06 NOTE — Progress Notes (Signed)
Pharmacy Antibiotic Note  Makayla Good is a 52 y.o. female admitted on 05/06/2022 with worsening CP and SOB associated with recent diagnosis of PNA . H/o pancreatic cancer for which she is currently receiving tx  Pharmacy has been consulted for cefepime dosing.  Plan: Cefepime 2gm IV q8h Follow renal function, cultures and clinical course    Temp (24hrs), Avg:100.8 F (38.2 C), Min:100.8 F (38.2 C), Max:100.8 F (38.2 C)  Recent Labs  Lab 05/02/22 0828 05/06/22 0430  WBC 2.7* 12.5*  CREATININE 0.54 0.57  LATICACIDVEN  --  1.3    Estimated Creatinine Clearance: 85.1 mL/min (by C-G formula based on SCr of 0.57 mg/dL).    No Known Allergies  Antimicrobials this admission: 1/14 azith >> 1/14 cefepime >>  Dose adjustments this admission:   Microbiology results: 1/14 BCx:   Thank you for allowing pharmacy to be a part of this patient's care. Dolly Rias RPh 05/06/2022, 5:15 AM

## 2022-05-06 NOTE — H&P (Signed)
History and Physical    Patient: Makayla Good DOB: 1970/06/22 DOA: 05/06/2022 DOS: the patient was seen and examined on 05/06/2022 PCP: [name], DO  Patient coming from: Home  Chief Complaint: No chief complaint on file.  HPI: Makayla Good is a 52 y.o. female with medical history significant of asthma, eczema, fibroids, hypertension, obesity, left polycystic ovary, sleep apnea not on CPAP, pancreatic carcinoma with last chemotherapy session on 04/18/2022 who was diagnosed on 05/02/2022 with community-acquired pneumonia given Omnicef, into the emergency department due to worsening cough and dyspnea since earlier in the week.  Of 4 days ago she was started on on Ancef, but has not noticed and the improvement.  She is coughing whitish or clear occasional red tinged sputum associated with pleuritic chest pain, dyspnea and chills.  No fevers at home, but the patient hide of 100.8 F fever in triage.  She has been having right-sided headache and nasal congestion.  No rhinorrhea or sore throat. No sick contacts or travel history.  No palpitations, diaphoresis, PND, orthopnea or pitting edema of the lower extremities.  She has occasional nausea, appetite with good and bad days, no abdominal pain, diarrhea, constipation, melena or hematochezia.  No flank pain, dysuria, frequency or hematuria.  No polyuria, polydipsia, polyphagia or blurred vision.  ED course: Initial vital signs were temperature 100.8 F, pulse 140, respirations 24, BP 99/65 mmHg O2 sat 94% room air.  The patient received acetaminophen 650 mg p.o. x 1, cefepime 2 g IVP, azithromycin 500 mg IVPB, KCl 40 mEq p.o. x 1 and 1000 mL of LR bolus.  I added magnesium sulfate 2 g IVPB and another dose of KCl 40 mEq p.o. for later this morning.  Lab work: Her CBC is her white count 12.5, hemoglobin 11.7 g/dL platelets 184.  PT 15.6 and INR 1.3.  Lactic acid was normal.  Troponin 17 and then 18  ng/L.  Magnesium 1.5 and phosphorus 2.9 mg/dL.  CMP with a potassium 3.2 and CO2 of 20 mmol/L with a normal anion gap.  Renal function and the rest of the electrolytes are normal after calcium correction.  Glucose 115 mg/dL.  Total protein 6.0 and albumin 2.8 g/dL.  Alkaline phosphatase 219 units/L.  Normal transaminases and total bilirubin level.  Imaging: 2 view chest radiograph with progressive left lower lung consolidation since 05/02/2022.  Right lung within normal limits.  Review of Systems: As mentioned in the history of present illness. All other systems reviewed and are negative. Past Medical History:  Diagnosis Date   Asthma    Eczema    Fibroid    H/O oophorectomy    Hypertension    Obesity    Pancreatic adenocarcinoma ([place]) 03/01/2022   Polycystic ovary    Left   Sleep apnea    no CPAP per MD   Past Surgical History:  Procedure Laterality Date   BACK SURGERY     70yr ago   BREAST BIOPSY Right 2019   fibroadenoma   CESAREAN SECTION  02/01/2005   CESAREAN SECTION  2001   IR IMAGING GUIDED PORT INSERTION  03/07/2022   LAPAROSCOPIC GASTRIC SLEEVE RESECTION N/A 07/29/2017   Procedure: LAPAROSCOPIC GASTRIC SLEEVE RESECTION WITH UPPER ENDO ;  Surgeon: CClovis Riley MD;  Location: [facility];  Service: General;  Laterality: N/A;   OOPHORECTOMY     Rt.removed   TUBAL LIGATION  02/01/2005   Social History:  reports that she quit smoking about 2  years ago. Her smoking use included cigarettes and e-cigarettes. She started smoking about 2 years ago. She has a 15.00 pack-year smoking history. She has never used smokeless tobacco. She reports current alcohol use. She reports that she does not use drugs.  No Known Allergies  Family History  Problem Relation Age of Onset   Hypertension Mother    Schizophrenia Mother    Dementia Mother    Mental illness Mother        schizophrenia, dementia   Hypertension Father    Coronary artery disease Father        Stent   Alzheimer's  disease Father    Hypertension Sister    Hypertension Sister    Arthritis Sister        rheumatoid   Dementia Maternal Aunt    Kidney disease Other    Colon cancer Neg Hx    Stomach cancer Neg Hx    Pancreatic cancer Neg Hx    Esophageal cancer Neg Hx    Rectal cancer Neg Hx    Breast cancer Neg Hx     Prior to Admission medications   Medication Sig Start Date End Date Taking? Authorizing Provider  amLODipine (NORVASC) 5 MG tablet TAKE 1 TABLET (5 MG TOTAL) BY MOUTH DAILY. 03/27/22  Yes Roma Schanz R, DO  cefdinir (OMNICEF) 300 MG capsule Take 2 capsules (600 mg total) by mouth daily. 05/02/22  Yes Celso Amy, NP  cyclobenzaprine (FLEXERIL) 10 MG tablet TAKE 1 TABLET BY MOUTH AT BEDTIME AS NEEDED FOR MUSCLE SPASMS 03/08/22  Yes Barnet Pall E, NP  dexamethasone (DECADRON) 4 MG tablet Take 2 tablets (8 mg total) by mouth daily. Take 2 tablets daily x 3 days starting the day after chemotherapy. Take with food. 03/13/22  Yes Ennever, Rudell Cobb, MD  fluticasone (FLONASE) 50 MCG/ACT nasal spray SPRAY 2 SPRAYS INTO EACH NOSTRIL EVERY DAY Patient taking differently: Place 1 spray into both nostrils daily as needed for allergies. 05/11/21  Yes Roma Schanz R, DO  hydrochlorothiazide (HYDRODIURIL) 25 MG tablet Take 1 tablet (25 mg total) by mouth daily. 12/29/21  Yes Roma Schanz R, DO  loperamide (IMODIUM) 2 MG capsule Take 2 tabs by mouth with first loose stool, then 1 tab with each additional loose stool as needed. Do not exceed 8 tabs in a 24-hour period 03/13/22  Yes Ennever, Rudell Cobb, MD  omeprazole (PRILOSEC) 40 MG capsule Take 1 capsule (40 mg total) by mouth daily. Patient taking differently: Take 40 mg by mouth daily as needed (acid reflux). 01/22/22  Yes Martinique, Betty G, MD  ondansetron (ZOFRAN) 8 MG tablet Take 1 tablet (8 mg total) by mouth every 8 (eight) hours as needed for nausea or vomiting. Start on the third day after cisplatin 03/13/22  Yes Ennever, Rudell Cobb,  MD  Oxycodone HCl 10 MG TABS Take 1 tablet (10 mg total) by mouth every 6 (six) hours as needed. Patient taking differently: Take 10 mg by mouth every 6 (six) hours as needed (pain). 03/21/22  Yes Ennever, Rudell Cobb, MD  potassium chloride SA (KLOR-CON M) 20 MEQ tablet Take 2 tablets (40 mEq total) by mouth 2 (two) times daily. 40 meq PO BID for 5 days and then 40 meq PO daily. 05/02/22  Yes Celso Amy, NP  prochlorperazine (COMPAZINE) 10 MG tablet Take 1 tablet (10 mg total) by mouth every 6 (six) hours as needed for nausea or vomiting (Nausea or vomiting). 03/13/22  Yes Ennever,  Rudell Cobb, MD  temazepam (RESTORIL) 15 MG capsule Take 1 capsule (15 mg total) by mouth at bedtime as needed for sleep. 02/28/22  Yes Volanda Napoleon, MD  lidocaine-prilocaine (EMLA) cream Apply to affected area once 03/13/22   Volanda Napoleon, MD    Physical Exam: Vitals:   05/06/22 0348 05/06/22 0349 05/06/22 0748  BP:  99/65   Pulse: (!) 140    Resp: (!) 24    Temp: (!) 100.8 F (38.2 C)  98.2 F (36.8 C)  TempSrc: Oral  Oral  SpO2: 94% 94%    Physical Exam Vitals and nursing note reviewed.  Constitutional:      General: She is awake. She is not in acute distress.    Appearance: Normal appearance.  HENT:     Head: Normocephalic.     Nose: No rhinorrhea.     Mouth/Throat:     Mouth: Mucous membranes are moist.  Eyes:     General: No scleral icterus.    Pupils: Pupils are equal, round, and reactive to light.  Neck:     Vascular: No JVD.  Cardiovascular:     Rate and Rhythm: Normal rate and regular rhythm.     Heart sounds: S1 normal and S2 normal.  Pulmonary:     Effort: Pulmonary effort is normal. No accessory muscle usage or respiratory distress.     Breath sounds: Examination of the left-middle field reveals rales. Examination of the left-lower field reveals decreased breath sounds and rales. Decreased breath sounds and rales present. No wheezing or rhonchi.  Abdominal:     General: Bowel  sounds are normal. There is no distension.     Palpations: Abdomen is soft.     Tenderness: There is no abdominal tenderness. There is no guarding.  Musculoskeletal:     Cervical back: Neck supple.     Right lower leg: No edema.     Left lower leg: No edema.  Skin:    General: Skin is warm and dry.  Neurological:     General: No focal deficit present.     Mental Status: She is alert and oriented to person, place, and time.  Psychiatric:        Mood and Affect: Mood normal.        Behavior: Behavior normal. Behavior is cooperative.   Data Reviewed:  Results are pending, will review when available.  Assessment and Plan: Principal Problem:   CAP (community acquired pneumonia) Admit to PCU/inpatient. Continue supplemental oxygen. Scheduled and as needed bronchodilators. Continue cefepime 2 g IVPB every 8 hours. Continue azithromycin 500 mg IVPB daily. Check strep pneumoniae urinary antigen. Check sputum Gram stain, culture and sensitivity. Follow-up blood culture and sensitivity. Follow-up CBC and chemistry in the morning.  Active Problems:   OSA (obstructive sleep apnea) Not on CPAP.    Hypomagnesemia  Replacement ordered. Follow level as needed.    Hypokalemia Replacing. Follow potassium level in AM.    HLD (hyperlipidemia) Currently not on medical therapy.    HTN (hypertension) Antihypertensives currently on hold. Monitor blood pressure. As needed antihypertensives.    Gastroesophageal reflux disease Continue PPI as needed.    Pancreatic adenocarcinoma ([place]) Follow-up with Dr. [name] at the cancer center. Continue analgesics as needed.     Advance Care Planning:   Code Status: Full Code   Consults:   Family Communication:   Severity of Illness: The appropriate patient status for this patient is INPATIENT. Inpatient status is judged to be reasonable and  necessary in order to provide the required intensity of service to ensure the patient's safety.  The patient's presenting symptoms, physical exam findings, and initial radiographic and laboratory data in the context of their chronic comorbidities is felt to place them at high risk for further clinical deterioration. Furthermore, it is not anticipated that the patient will be medically stable for discharge from the hospital within 2 midnights of admission.   * I certify that at the point of admission it is my clinical judgment that the patient will require inpatient hospital care spanning beyond 2 midnights from the point of admission due to high intensity of service, high risk for further deterioration and high frequency of surveillance required.*  Author: Reubin Milan, MD 05/06/2022 7:54 AM  For on call review [email or web address].   This document was prepared using Dragon voice recognition software and may contain some unintended transcription errors.

## 2022-05-07 ENCOUNTER — Other Ambulatory Visit: Payer: Self-pay

## 2022-05-07 ENCOUNTER — Encounter (HOSPITAL_COMMUNITY): Payer: Self-pay | Admitting: Internal Medicine

## 2022-05-07 DIAGNOSIS — I10 Essential (primary) hypertension: Secondary | ICD-10-CM

## 2022-05-07 DIAGNOSIS — K219 Gastro-esophageal reflux disease without esophagitis: Secondary | ICD-10-CM | POA: Diagnosis not present

## 2022-05-07 DIAGNOSIS — E876 Hypokalemia: Secondary | ICD-10-CM

## 2022-05-07 DIAGNOSIS — J189 Pneumonia, unspecified organism: Secondary | ICD-10-CM | POA: Diagnosis not present

## 2022-05-07 DIAGNOSIS — E785 Hyperlipidemia, unspecified: Secondary | ICD-10-CM | POA: Diagnosis not present

## 2022-05-07 DIAGNOSIS — C259 Malignant neoplasm of pancreas, unspecified: Secondary | ICD-10-CM

## 2022-05-07 LAB — COMPREHENSIVE METABOLIC PANEL
ALT: 20 U/L (ref 0–44)
AST: 20 U/L (ref 15–41)
Albumin: 2.3 g/dL — ABNORMAL LOW (ref 3.5–5.0)
Alkaline Phosphatase: 217 U/L — ABNORMAL HIGH (ref 38–126)
Anion gap: 10 (ref 5–15)
BUN: <5 mg/dL — ABNORMAL LOW (ref 6–20)
CO2: 23 mmol/L (ref 22–32)
Calcium: 7.8 mg/dL — ABNORMAL LOW (ref 8.9–10.3)
Chloride: 107 mmol/L (ref 98–111)
Creatinine, Ser: 0.43 mg/dL — ABNORMAL LOW (ref 0.44–1.00)
GFR, Estimated: >60 mL/min (ref >60)
Glucose, Bld: 82 mg/dL (ref 70–99)
Potassium: 3.3 mmol/L — ABNORMAL LOW (ref 3.5–5.1)
Sodium: 140 mmol/L (ref 135–145)
Total Bilirubin: 0.5 mg/dL (ref 0.3–1.2)
Total Protein: 5.5 g/dL — ABNORMAL LOW (ref 6.5–8.1)

## 2022-05-07 LAB — CBC WITH DIFFERENTIAL/PLATELET
Abs Immature Granulocytes: 0.13 10*3/uL — ABNORMAL HIGH (ref 0.00–0.07)
Basophils Absolute: 0 10*3/uL (ref 0.0–0.1)
Basophils Relative: 0 %
Eosinophils Absolute: 0 10*3/uL (ref 0.0–0.5)
Eosinophils Relative: 0 %
HCT: 29.8 % — ABNORMAL LOW (ref 36.0–46.0)
Hemoglobin: 9.4 g/dL — ABNORMAL LOW (ref 12.0–15.0)
Immature Granulocytes: 1 %
Lymphocytes Relative: 4 %
Lymphs Abs: 0.5 10*3/uL — ABNORMAL LOW (ref 0.7–4.0)
MCH: 31.2 pg (ref 26.0–34.0)
MCHC: 31.5 g/dL (ref 30.0–36.0)
MCV: 99 fL (ref 80.0–100.0)
Monocytes Absolute: 1.6 10*3/uL — ABNORMAL HIGH (ref 0.1–1.0)
Monocytes Relative: 13 %
Neutro Abs: 10.1 10*3/uL — ABNORMAL HIGH (ref 1.7–7.7)
Neutrophils Relative %: 82 %
Platelets: 167 10*3/uL (ref 150–400)
RBC: 3.01 MIL/uL — ABNORMAL LOW (ref 3.87–5.11)
RDW: 17.9 % — ABNORMAL HIGH (ref 11.5–15.5)
WBC: 12.3 10*3/uL — ABNORMAL HIGH (ref 4.0–10.5)
nRBC: 0.2 % (ref 0.0–0.2)

## 2022-05-07 LAB — PHOSPHORUS: Phosphorus: 3.2 mg/dL (ref 2.5–4.6)

## 2022-05-07 LAB — MAGNESIUM: Magnesium: 2 mg/dL (ref 1.7–2.4)

## 2022-05-07 MED ORDER — CHLORHEXIDINE GLUCONATE CLOTH 2 % EX PADS
6.0000 | MEDICATED_PAD | Freq: Every day | CUTANEOUS | Status: DC
  Administered 2022-05-08 – 2022-05-12 (×6): 6 via TOPICAL

## 2022-05-07 MED ORDER — GUAIFENESIN ER 600 MG PO TB12
1200.0000 mg | ORAL_TABLET | Freq: Two times a day (BID) | ORAL | Status: DC
  Administered 2022-05-07 – 2022-05-12 (×10): 1200 mg via ORAL
  Filled 2022-05-07 (×10): qty 2

## 2022-05-07 MED ORDER — SODIUM CHLORIDE 0.9 % IV SOLN: INTRAVENOUS | Status: DC | PRN

## 2022-05-07 NOTE — Progress Notes (Signed)
PROGRESS NOTE    Makayla Good  RXV:[id number] DOB: Feb 18, 1971 DOA: 05/06/2022 PCP: [name], DO   Brief Narrative:  The patient is a 52 year old overweight African-American female with a past medical history significant for but limited to asthma, eczema, history of fibroids, hypertension, history of left polycystic ovary, sleep apnea not on CPAP, history of pancreatic carcinoma last chemotherapy session on 08/17/2021 who was recently diagnosed with a community-acquired pneumonia on 05/02/2022 and given Omnicef in the outpatient setting however had progressively worsening cough and dyspnea since the beginning of the week.  He was started on Omnicef as an outpatient by Dr. Nadine Counts improvement and she was coughing very frequently with occasional blood-tinged sputum associated pleuritic chest pain, dyspnea and chills.  She denies any fevers at home but had a temperature of 100.8 in the triage area.  She has been having a right-sided headache as well as nasal congestion and no rhinorrhea or sore throat.  Denies any sick contacts and has occasional nausea and appetite with good and bad days.  She came to the ED and initial vital signs showed that she is slightly febrile with a pulse of 140 respirations of 24 and O2 saturations of 94%.  She received acetaminophen IV cefepime azithromycin and given IV fluid bolus.  Imaging showed a progressive left lower lobe consolidation since 05/02/2021 and right lung was within normal limits.  She is admitted for a left lower lobe community-acquired pneumonia with outpatient failure of antibiotics.  Assessment and Plan:  Left Lower Lobe CAP (community acquired pneumonia) -Admitted to PCU/inpatient. -Continue supplemental oxygen. -SpO2: 92 % -Scheduled and as needed bronchodilators and she is on DuoNeb 3 mL's every 4 as needed for wheezing and shortness of breath -She was initiated on guaifenesin 600 mg p.o. twice daily but will increase to 1200  mg p.o. twice daily -PCT was <0.10 and LA was 1.3 -Continue Cefepime 2 g IVPB every 8 hours and Azithromycin 500 mg IVPB daily. -Check strep pneumoniae urinary antigen. -Check sputum Gram stain, culture and sensitivity. -WBC Trend: Recent Labs  Lab 04/18/22 0820 05/02/22 0828 05/06/22 0430 05/07/22 1016  WBC 6.4 2.7* 12.5* 12.3*  -Influenza A and B, RSV as well as SARS-CoV-2 PCR negative -Will order flutter valve and incentive spirometry -Follow-up blood culture and sensitivity blood cultures x 2 showed no growth at 1 day -She will need an amatory home O2 screen prior to discharge as well as a repeat chest x-ray in a.m.   OSA (obstructive sleep apnea) -Not on CPAP.   Hypomagnesemia  -Mag Level Trend: Recent Labs  Lab 05/06/22 0633 05/07/22 1016  MG 1.5* 2.0  -Continue to Monitor and Replete as Necessary -Repeat Mag Level in the AM    Hypokalemia -K+ Trend: Recent Labs  Lab 04/18/22 0820 05/02/22 0828 05/06/22 0430 05/07/22 1016  K 2.7* 2.8* 3.2* 3.3*  -Continue to monitor and replete as necessary -Repeat CMP in a.m.   HLD (Hyperlipidemia) -Currently not on medical therapy.   HTN (Hypertension) -Antihypertensives currently on hold. -Continue to Monitor blood pressure per Protocol  -Will initiate As needed Antihypertensives if BP remains elevated .   Gastroesophageal reflux disease/GI Prophylaxis  -Continue PPI with Pantoprazole 40 mg po Daily as needed.  Normocytic Anemia  -Hgb/Hct Trend: Recent Labs  Lab 04/18/22 0820 05/02/22 0828 05/06/22 0430 05/07/22 1016  HGB 8.6* 11.0* 11.7* 9.4*  HCT 26.0* 33.5* 36.2 29.8*  MCV 98.9 94.9 97.3 99.0  -Check Anemia Panel in the AM  -  Continue to Monitor for S/Sx of Bleeding; No overt bleeding noted -Repeat CBC in the AM   Hypoalbuminemia -Albumin Trend: Recent Labs  Lab 04/18/22 0820 05/02/22 0828 05/06/22 0430 05/07/22 1016  ALBUMIN 3.6 3.0* 2.8* 2.3*  -Continue to Monitor and Trend and Repeat CMP in  the AM    Pancreatic Adenocarcinoma ([place]) -Follow-up with Dr. [name] at the cancer center. -Continue analgesics as needed. -Dr [name] evaluated patient has had 1 cycle of chemotherapy with [facility] -Patient was to get her second cycle of chemotherapy last week but did not; her CA 19-9 had improved nicely and per oncology they feel that the cancer has improved given her improvement in her tumor marker  Overweight -Complicates overall prognosis and care -Estimated body mass index is 28.02 kg/m as calculated from the following:   Height as of 05/02/22: '5\' 5"'$  (1.651 m).   Weight as of 05/02/22: 76.4 kg.  -Weight Loss and Dietary Counseling given  DVT prophylaxis: SCDs Start: 05/06/22 0559    Code Status: Full Code Family Communication: Discussed with family at bedside  Disposition Plan:  Level of care: Progressive Status is: Inpatient Remains inpatient appropriate because: Needs further clinical improvement and will need an amatory home O2 screen prior to discharge as well as repeat chest x-ray in a.m.   Consultants:  None  Procedures:  As delineated as above  Antimicrobials:  Anti-infectives (From admission, onward)    Start     Dose/Rate Route Frequency Ordered Stop   05/06/22 1400  ceFEPIme (MAXIPIME) 2 g in sodium chloride 0.9 % 100 mL IVPB        2 g 200 mL/hr over 30 Minutes Intravenous Every 8 hours 05/06/22 0516     05/06/22 0445  cefTRIAXone (ROCEPHIN) 2 g in sodium chloride 0.9 % 100 mL IVPB  Status:  Discontinued        2 g 200 mL/hr over 30 Minutes Intravenous Every 24 hours 05/06/22 0433 05/06/22 0436   05/06/22 0445  azithromycin (ZITHROMAX) 500 mg in sodium chloride 0.9 % 250 mL IVPB        500 mg 250 mL/hr over 60 Minutes Intravenous Every 24 hours 05/06/22 0433 05/11/22 0444   05/06/22 0445  ceFEPIme (MAXIPIME) 2 g in sodium chloride 0.9 % 100 mL IVPB        2 g 200 mL/hr over 30 Minutes Intravenous  Once 05/06/22 0437 05/06/22 0738        Subjective: Seen and examined at bedside think she is doing a little bit better.  Still feels a little short breath and coughing up sputum which is sometimes greenish and whitish and sometimes with red in it.  She denies any lightheadedness or dizziness.  No other concerns or complaints at this time.  Objective: Vitals:   05/07/22 0818 05/07/22 1000 05/07/22 1249 05/07/22 1300  BP:  131/78  117/77  Pulse:  (!) 107  97  Resp:  (!) 23  20  Temp: 99.5 F (37.5 C)  98.5 F (36.9 C)   TempSrc: Oral  Oral   SpO2:  94%  92%    Intake/Output Summary (Last 24 hours) at 05/07/2022 1420 Last data filed at 05/07/2022 0413 Gross per 24 hour  Intake 1000 ml  Output --  Net 1000 ml   There were no vitals filed for this visit.  Examination: Physical Exam:  Constitutional: WN/WD overweight African-American female currently no acute distress Respiratory: Diminished to auscultation bilaterally, no wheezing, rales, rhonchi or crackles. Normal respiratory effort  and patient is not tachypenic. No accessory muscle use.  Unlabored breathing Cardiovascular: RRR, no murmurs / rubs / gallops. S1 and S2 auscultated. No extremity edema. Abdomen: Soft, non-tender, non-distended.  Bowel sounds positive x 4.  GU: Deferred. Musculoskeletal: No clubbing / cyanosis of digits/nails. No joint deformity upper and lower extremities Skin: No rashes, lesions, ulcers on limited skin evaluation. No induration; Warm and dry.  Neurologic: CN 2-12 grossly intact with no focal deficits. Romberg sign and cerebellar reflexes not assessed.  Psychiatric: Normal judgment and insight. Alert and oriented x 3. Normal mood and appropriate affect.   Data Reviewed: I have personally reviewed following labs and imaging studies  CBC: Recent Labs  Lab 05/02/22 0828 05/06/22 0430 05/07/22 1016  WBC 2.7* 12.5* 12.3*  NEUTROABS 1.9 10.3* 10.1*  HGB 11.0* 11.7* 9.4*  HCT 33.5* 36.2 29.8*  MCV 94.9 97.3 99.0  PLT 202 184 503    Basic Metabolic Panel: Recent Labs  Lab 05/02/22 0828 05/06/22 0430 05/06/22 0633 05/07/22 1016  NA 137 140  --  140  K 2.8* 3.2*  --  3.3*  CL 107 109  --  107  CO2 22 20*  --  23  GLUCOSE 113* 115*  --  82  BUN 7 6  --  <5*  CREATININE 0.54 0.57  --  0.43*  CALCIUM 8.6* 8.4*  --  7.8*  MG  --   --  1.5* 2.0  PHOS  --   --  2.9 3.2   GFR: Estimated Creatinine Clearance: 85.1 mL/min (A) (by C-G formula based on SCr of 0.43 mg/dL (L)). Liver Function Tests: Recent Labs  Lab 05/02/22 0828 05/06/22 0430 05/07/22 1016  AST 44* 25 20  ALT 36 26 20  ALKPHOS 185* 219* 217*  BILITOT 0.3 0.7 0.5  PROT 6.4* 6.0* 5.5*  ALBUMIN 3.0* 2.8* 2.3*   No results for input(s): "LIPASE", "AMYLASE" in the last 168 hours. No results for input(s): "AMMONIA" in the last 168 hours. Coagulation Profile: Recent Labs  Lab 05/06/22 0430  INR 1.3*   Cardiac Enzymes: No results for input(s): "CKTOTAL", "CKMB", "CKMBINDEX", "TROPONINI" in the last 168 hours. BNP (last 3 results) No results for input(s): "PROBNP" in the last 8760 hours. HbA1C: No results for input(s): "HGBA1C" in the last 72 hours. CBG: No results for input(s): "GLUCAP" in the last 168 hours. Lipid Profile: No results for input(s): "CHOL", "HDL", "LDLCALC", "TRIG", "CHOLHDL", "LDLDIRECT" in the last 72 hours. Thyroid Function Tests: No results for input(s): "TSH", "T4TOTAL", "FREET4", "T3FREE", "THYROIDAB" in the last 72 hours. Anemia Panel: No results for input(s): "VITAMINB12", "FOLATE", "FERRITIN", "TIBC", "IRON", "RETICCTPCT" in the last 72 hours. Sepsis Labs: Recent Labs  Lab 05/06/22 0430 05/06/22 5465  PROCALCITON  --  <0.10  LATICACIDVEN 1.3  --    Recent Results (from the past 240 hour(s))  Culture, blood (Routine x 2)     Status: None (Preliminary result)   Collection Time: 05/06/22  4:30 AM   Specimen: BLOOD  Result Value Ref Range Status   Specimen Description   Final    BLOOD RIGHT  ANTECUBITAL Performed at [facility] [address] [place], [place] [address]    Special Requests   Final    BOTTLES DRAWN AEROBIC AND ANAEROBIC Blood Culture adequate volume Performed at [facility] [address] [place], [place] [address]    Culture   Final    NO GROWTH 1 DAY Performed at [facility]  Lab, [address] [place], [place] [address]    Report Status PENDING  Incomplete  Resp panel by RT-PCR (RSV, Flu A&B, Covid) Anterior Nasal Swab     Status: None   Collection Time: 05/06/22  6:48 AM   Specimen: Anterior Nasal Swab  Result Value Ref Range Status   SARS Coronavirus 2 by RT PCR NEGATIVE NEGATIVE Final    Comment: (NOTE) SARS-CoV-2 target nucleic acids are NOT DETECTED.  The SARS-CoV-2 RNA is generally detectable in upper respiratory specimens during the acute phase of infection. The lowest concentration of SARS-CoV-2 viral copies this assay can detect is 138 copies/mL. A negative result does not preclude SARS-Cov-2 infection and should not be used as the sole basis for treatment or other patient management decisions. A negative result may occur with  improper specimen collection/handling, submission of specimen other than nasopharyngeal swab, presence of viral mutation(s) within the areas targeted by this assay, and inadequate number of viral copies(<138 copies/mL). A negative result must be combined with clinical observations, patient history, and epidemiological information. The expected result is Negative.  Fact Sheet for Patients:  EntrepreneurPulse.com.au  Fact Sheet for Healthcare Providers:  IncredibleEmployment.be  This test is no t yet approved or cleared by the [place] FDA and  has been authorized for detection and/or diagnosis of SARS-CoV-2 by FDA under an Emergency Use Authorization (EUA). This EUA will remain  in effect (meaning this test can be used) for  the duration of the COVID-19 declaration under Section 564(b)(1) of the Act, 21 U.S.C.section 360bbb-3(b)(1), unless the authorization is terminated  or revoked sooner.       Influenza A by PCR NEGATIVE NEGATIVE Final   Influenza B by PCR NEGATIVE NEGATIVE Final    Comment: (NOTE) The Xpert Xpress SARS-CoV-2/FLU/RSV plus assay is intended as an aid in the diagnosis of influenza from Nasopharyngeal swab specimens and should not be used as a sole basis for treatment. Nasal washings and aspirates are unacceptable for Xpert Xpress SARS-CoV-2/FLU/RSV testing.  Fact Sheet for Patients: EntrepreneurPulse.com.au  Fact Sheet for Healthcare Providers: IncredibleEmployment.be  This test is not yet approved or cleared by the [place] FDA and has been authorized for detection and/or diagnosis of SARS-CoV-2 by FDA under an Emergency Use Authorization (EUA). This EUA will remain in effect (meaning this test can be used) for the duration of the COVID-19 declaration under Section 564(b)(1) of the Act, 21 U.S.C. section 360bbb-3(b)(1), unless the authorization is terminated or revoked.     Resp Syncytial Virus by PCR NEGATIVE NEGATIVE Final    Comment: (NOTE) Fact Sheet for Patients: EntrepreneurPulse.com.au  Fact Sheet for Healthcare Providers: IncredibleEmployment.be  This test is not yet approved or cleared by the [place] FDA and has been authorized for detection and/or diagnosis of SARS-CoV-2 by FDA under an Emergency Use Authorization (EUA). This EUA will remain in effect (meaning this test can be used) for the duration of the COVID-19 declaration under Section 564(b)(1) of the Act, 21 U.S.C. section 360bbb-3(b)(1), unless the authorization is terminated or revoked.  Performed at [facility], [place] [address], [place], [place] [address]     Radiology Studies: DG Chest 2  View  Result Date: 05/06/2022 CLINICAL DATA:  52 year old female with suspected sepsis. Cough. Metastatic pancreatic cancer. EXAM: CHEST - 2 VIEW COMPARISON:  Chest radiographs 05/02/2022 and earlier. FINDINGS: Stable right chest Port-A-Cath. Continued somewhat low lung volumes. Increasing and now confluent left lung base opacity obscuring the diaphragm. Air bronchograms now. Streaky opacity  there 4 days ago. No superimposed pneumothorax or pulmonary edema. Right lung appears stable and negative. Visible mediastinal contours remain within normal limits. Visualized tracheal air column is within normal limits. No acute osseous abnormality identified. Negative visible bowel gas. IMPRESSION: 1. Progressive lower left lung consolidation since 05/02/2022. This is nonspecific but consider Pneumonia. 2. Right lung within normal limits.  Stable right chest Port-A-Cath. Electronically Signed   By: Genevie Ann M.D.   On: 05/06/2022 04:25    Scheduled Meds:  guaiFENesin  600 mg Oral BID   Continuous Infusions:  azithromycin Stopped (05/07/22 0916)   ceFEPime (MAXIPIME) IV Stopped (05/07/22 2263)    LOS: 1 day   Raiford Noble, DO [facility] Available via Epic secure chat 7am-7pm After these hours, please refer to coverage provider listed on [email or web address] 05/07/2022, 2:20 PM

## 2022-05-07 NOTE — Consult Note (Signed)
Ms. Entwistle is well-known to me.  She is a very charming 53 year old Afro-American female.  She has metastatic pancreatic cancer.  She has disease in her liver.  She has had 1 cycle of chemotherapy.  This was about a month ago.  She was in the office last week to get her second cycle of treatment.  However, she became dizzy.  She had little bit of a cough.  There is no fever.  Patient had chest x-ray that was done.  This seemed to show pneumonia in the left lower lung.  She got some IV antibiotic in the office with Rocephin.  Was then put on Omnicef.  She did not improve.  She did not have any emergency room over the weekend.  A chest x-ray was done which showed progressive changes in the left lung.  She is on IV Maxipime and azithromycin.  Her labs show white count 12.5.  Hemoglobin 11.7.  Platelet count 184,000.  Sodium 140.  Potassium 3.2.  BUN 6 creatinine 0.53.  Calcium 8.4 with an albumin of 2.8.  Of note, her last CA 19-9 had improved nicely.  She looks pretty good.  She did get a breathing treatment in the emergency room.  She has had no problems with pain wants.  She has had no nausea or vomiting.  She has had no diarrhea.  She is urinating well.  Her vital signs show temperature of 98.  Pulse 97.  Blood pressure 124/85.  Oxygen saturation is 91%.  Her lungs sound relatively clear bilaterally.  There may be some slight wheezing.  Cardiac exam regular rate and rhythm.  She has no murmurs.  Oral exam does not show any mucositis.  Abdomen is soft.  Bowel sounds are present.  She has no fluid wave.  There is no palpable liver or spleen tip.  Extremity shows no clubbing, cyanosis or edema.  No venous cord is noted in the legs.  She has good range of motion of the joints.  Neurological exam is nonfocal.   Ms. Setzer is a very nice 52 year old African-American female.  She has metastatic pancreatic cancer.  She has had 1 cycle of chemotherapy with FOLFIRINOX.  She is due for her second cycle  last week on January 9.  I am glad that she did not get the chemotherapy.  Her immune system is okay as her white cell count is fine.  Her tumor marker did respond so I would think that her cancer has improved.  She needs to be treated for this pneumonia before we do any further chemotherapy on her.  Hopefully, she will be able to be admitted.  She has she does look quite good.  Hopefully, she will not need inpatient hospitalization for all that long.  I know that she will get outstanding care from what ever floor she goes on to.  I know that the ER is doing a wonderful job with her.   Lattie Haw, MD  Jeneen Rinks 1:5

## 2022-05-08 ENCOUNTER — Inpatient Hospital Stay (HOSPITAL_COMMUNITY): Payer: BC Managed Care – PPO

## 2022-05-08 DIAGNOSIS — G4733 Obstructive sleep apnea (adult) (pediatric): Secondary | ICD-10-CM

## 2022-05-08 DIAGNOSIS — E876 Hypokalemia: Secondary | ICD-10-CM | POA: Diagnosis not present

## 2022-05-08 DIAGNOSIS — J189 Pneumonia, unspecified organism: Secondary | ICD-10-CM | POA: Diagnosis not present

## 2022-05-08 DIAGNOSIS — E785 Hyperlipidemia, unspecified: Secondary | ICD-10-CM | POA: Diagnosis not present

## 2022-05-08 DIAGNOSIS — K219 Gastro-esophageal reflux disease without esophagitis: Secondary | ICD-10-CM | POA: Diagnosis not present

## 2022-05-08 LAB — CBC WITH DIFFERENTIAL/PLATELET
Abs Immature Granulocytes: 0.08 10*3/uL — ABNORMAL HIGH (ref 0.00–0.07)
Basophils Absolute: 0 10*3/uL (ref 0.0–0.1)
Basophils Relative: 0 %
Eosinophils Absolute: 0.1 10*3/uL (ref 0.0–0.5)
Eosinophils Relative: 1 %
HCT: 28.8 % — ABNORMAL LOW (ref 36.0–46.0)
Hemoglobin: 9.2 g/dL — ABNORMAL LOW (ref 12.0–15.0)
Immature Granulocytes: 1 %
Lymphocytes Relative: 5 %
Lymphs Abs: 0.5 10*3/uL — ABNORMAL LOW (ref 0.7–4.0)
MCH: 31.2 pg (ref 26.0–34.0)
MCHC: 31.9 g/dL (ref 30.0–36.0)
MCV: 97.6 fL (ref 80.0–100.0)
Monocytes Absolute: 1.3 10*3/uL — ABNORMAL HIGH (ref 0.1–1.0)
Monocytes Relative: 13 %
Neutro Abs: 7.7 10*3/uL (ref 1.7–7.7)
Neutrophils Relative %: 80 %
Platelets: 180 10*3/uL (ref 150–400)
RBC: 2.95 MIL/uL — ABNORMAL LOW (ref 3.87–5.11)
RDW: 17.6 % — ABNORMAL HIGH (ref 11.5–15.5)
WBC: 9.6 10*3/uL (ref 4.0–10.5)
nRBC: 0 % (ref 0.0–0.2)

## 2022-05-08 LAB — COMPREHENSIVE METABOLIC PANEL
ALT: 17 U/L (ref 0–44)
AST: 18 U/L (ref 15–41)
Albumin: 2.3 g/dL — ABNORMAL LOW (ref 3.5–5.0)
Alkaline Phosphatase: 216 U/L — ABNORMAL HIGH (ref 38–126)
Anion gap: 8 (ref 5–15)
BUN: 5 mg/dL — ABNORMAL LOW (ref 6–20)
CO2: 25 mmol/L (ref 22–32)
Calcium: 7.7 mg/dL — ABNORMAL LOW (ref 8.9–10.3)
Chloride: 105 mmol/L (ref 98–111)
Creatinine, Ser: 0.36 mg/dL — ABNORMAL LOW (ref 0.44–1.00)
GFR, Estimated: >60 mL/min (ref >60)
Glucose, Bld: 84 mg/dL (ref 70–99)
Potassium: 2.9 mmol/L — ABNORMAL LOW (ref 3.5–5.1)
Sodium: 138 mmol/L (ref 135–145)
Total Bilirubin: 0.5 mg/dL (ref 0.3–1.2)
Total Protein: 5.4 g/dL — ABNORMAL LOW (ref 6.5–8.1)

## 2022-05-08 LAB — MAGNESIUM: Magnesium: 2 mg/dL (ref 1.7–2.4)

## 2022-05-08 LAB — PHOSPHORUS: Phosphorus: 3.4 mg/dL (ref 2.5–4.6)

## 2022-05-08 MED ORDER — POTASSIUM CHLORIDE CRYS ER 20 MEQ PO TBCR
40.0000 meq | EXTENDED_RELEASE_TABLET | Freq: Two times a day (BID) | ORAL | Status: AC
  Administered 2022-05-08 – 2022-05-09 (×2): 40 meq via ORAL
  Filled 2022-05-08 (×2): qty 2

## 2022-05-08 MED ORDER — POTASSIUM CHLORIDE 10 MEQ/100ML IV SOLN
10.0000 meq | INTRAVENOUS | Status: AC
  Administered 2022-05-08 – 2022-05-09 (×4): 10 meq via INTRAVENOUS
  Filled 2022-05-08 (×4): qty 100

## 2022-05-08 MED ORDER — IPRATROPIUM-ALBUTEROL 0.5-2.5 (3) MG/3ML IN SOLN
3.0000 mL | Freq: Four times a day (QID) | RESPIRATORY_TRACT | Status: DC
  Administered 2022-05-08 – 2022-05-09 (×4): 3 mL via RESPIRATORY_TRACT
  Filled 2022-05-08 (×4): qty 3

## 2022-05-08 NOTE — Progress Notes (Signed)
PROGRESS NOTE    Epiphany Seltzer  MHD:[id number] DOB: 09-07-1970 DOA: 05/06/2022 PCP: [name], DO   Brief Narrative:  The patient is a 52 year old overweight African-American female with a past medical history significant for but limited to asthma, eczema, history of fibroids, hypertension, history of left polycystic ovary, sleep apnea not on CPAP, history of pancreatic carcinoma last chemotherapy session on 08/17/2021 who was recently diagnosed with a community-acquired pneumonia on 05/02/2022 and given Omnicef in the outpatient setting however had progressively worsening cough and dyspnea since the beginning of the week. He was started on Omnicef as an outpatient by Dr. Nadine Counts improvement and she was coughing very frequently with occasional blood-tinged sputum associated pleuritic chest pain, dyspnea and chills. She denies any fevers at home but had a temperature of 100.8 in the triage area. She has been having a right-sided headache as well as nasal congestion and no rhinorrhea or sore throat. Denies any sick contacts and has occasional nausea and appetite with good and bad days. She came to the ED and initial vital signs showed that she is slightly febrile with a pulse of 140 respirations of 24 and O2 saturations of 94%. She received acetaminophen IV cefepime azithromycin and given IV fluid bolus. Imaging showed a progressive left lower lobe consolidation since 05/02/2021 and right lung was within normal limits. She is admitted for a left lower lobe community-acquired pneumonia with outpatient failure of antibiotics.  He is slowly improving and is not neutropenic.  She will be continued on antibiotics with IV cefepime and IV azithromycin.  Assessment and Plan:  Left Lower Lobe CAP (community acquired pneumonia) -Admitted to PCU/inpatient. -Continue supplemental oxygen. SpO2: 95 % -Scheduled and as needed bronchodilators and she is on DuoNeb 3 mL's every 4 as needed for  wheezing and shortness of breath -She was initiated on guaifenesin 600 mg p.o. twice daily but will increase to 1200 mg p.o. twice daily -PCT was <0.10 and LA was 1.3 -Continue Cefepime 2 g IVPB every 8 hours and Azithromycin 500 mg IVPB daily. -Check strep pneumoniae urinary antigen. -Check sputum Gram stain, culture and sensitivity. -WBC Trend: Recent Labs  Lab 04/18/22 0820 05/02/22 0828 05/06/22 0430 05/07/22 1016 05/08/22 0336  WBC 6.4 2.7* 12.5* 12.3* 9.6  -Influenza A and B, RSV as well as SARS-CoV-2 PCR negative -Will order flutter valve and incentive spirometry -Follow-up blood culture and sensitivity blood cultures x 2 showed no growth at 1 day -She will need an Ambulatory home O2 screen prior to discharge as well as a repeat chest x-ray in a.m.   OSA (obstructive sleep apnea) -Not on CPAP.   Hypomagnesemia  -Mag Level Trend: Recent Labs  Lab 05/06/22 0633 05/07/22 1016 05/08/22 0336  MG 1.5* 2.0 2.0  -Continue to Monitor and Replete as Necessary -Repeat Mag Level in the AM    Hypokalemia -K+ Trend: Recent Labs  Lab 04/18/22 0820 05/02/22 0828 05/06/22 0430 05/07/22 1016 05/08/22 0336  K 2.7* 2.8* 3.2* 3.3* 2.9*  -Replete with IV Kcl 40 mEQ and po 40 mEQ BID x2 -Continue to monitor and replete as necessary -Repeat CMP in a.m.   HLD (Hyperlipidemia) -Currently not on medical therapy.   HTN (Hypertension) -Antihypertensives currently on hold. -Continue to Monitor blood pressure per Protocol  -Will initiate As needed Antihypertensives if BP remains elevated  -Last blood pressure reading was 124/82   Gastroesophageal reflux disease/GI Prophylaxis  -Continue PPI with Pantoprazole 40 mg po Daily as needed.   Normocytic  Anemia  -Hgb/Hct Trend: Recent Labs  Lab 04/18/22 0820 05/02/22 0828 05/06/22 0430 05/07/22 1016 05/08/22 0336  HGB 8.6* 11.0* 11.7* 9.4* 9.2*  HCT 26.0* 33.5* 36.2 29.8* 28.8*  MCV 98.9 94.9 97.3 99.0 97.6  -Check Anemia  Panel in the AM  -Continue to Monitor for S/Sx of Bleeding; No overt bleeding noted -Repeat CBC in the AM    Hypoalbuminemia -Albumin Trend: Recent Labs  Lab 04/18/22 0820 05/02/22 0828 05/06/22 0430 05/07/22 1016 05/08/22 0336  ALBUMIN 3.6 3.0* 2.8* 2.3* 2.3*  -Continue to Monitor and Trend and Repeat CMP in the AM    Pancreatic Adenocarcinoma ([place]) -Follow-up with Dr. [name] at the cancer center. -Continue analgesics as needed. -Dr [name] evaluated patient has had 1 cycle of chemotherapy with [facility] -Patient was to get her second cycle of chemotherapy last week but did not; her CA 19-9 had improved nicely and per oncology they feel that the cancer has improved given her improvement in her tumor marker   Overweight -Complicates overall prognosis and care -Estimated body mass index is 29.06 kg/m as calculated from the following:   Height as of this encounter: '5\' 5"'$  (1.651 m).   Weight as of this encounter: 79.2 kg.  -Weight Loss and Dietary Counseling given  DVT prophylaxis: SCDs Start: 05/06/22 0559    Code Status: Full Code Family Communication: No family currently at bedside  Disposition Plan:  Level of care: Progressive Status is: Inpatient Remains inpatient appropriate because: Needs further clinical improvement and is slowly improving and she only feels about 40% better since coming in.  She will need an ambulatory home O2 screen prior to discharge   Consultants:  Medical Oncology   Procedures:  As delineated as above  Antimicrobials:  Anti-infectives (From admission, onward)    Start     Dose/Rate Route Frequency Ordered Stop   05/06/22 1400  ceFEPIme (MAXIPIME) 2 g in sodium chloride 0.9 % 100 mL IVPB        2 g 200 mL/hr over 30 Minutes Intravenous Every 8 hours 05/06/22 0516     05/06/22 0445  cefTRIAXone (ROCEPHIN) 2 g in sodium chloride 0.9 % 100 mL IVPB  Status:  Discontinued        2 g 200 mL/hr over 30 Minutes Intravenous Every 24 hours  05/06/22 0433 05/06/22 0436   05/06/22 0445  azithromycin (ZITHROMAX) 500 mg in sodium chloride 0.9 % 250 mL IVPB        500 mg 250 mL/hr over 60 Minutes Intravenous Every 24 hours 05/06/22 0433 05/11/22 0444   05/06/22 0445  ceFEPIme (MAXIPIME) 2 g in sodium chloride 0.9 % 100 mL IVPB        2 g 200 mL/hr over 30 Minutes Intravenous  Once 05/06/22 0437 05/06/22 0738       Subjective: Seen and examined at bedside the patient is doing little bit better but states that she is still not back to her baseline.  States she is going about 40% better.  States that she is little bit of some sputum up.  No nausea or vomiting.  Denies any lightheadedness or dizziness.  No other concerns or complaints at this time.  Objective: Vitals:   05/08/22 0845 05/08/22 1213 05/08/22 1340 05/08/22 1904  BP:  124/82    Pulse:  98    Resp:  18    Temp:  99.2 F (37.3 C)    TempSrc:  Oral    SpO2: 91% 94% 93% 95%  Weight:  Height:        Intake/Output Summary (Last 24 hours) at 05/08/2022 2000 Last data filed at 05/08/2022 1830 Gross per 24 hour  Intake 923.79 ml  Output --  Net 923.79 ml   Filed Weights   05/07/22 1855 05/08/22 0500  Weight: 76 kg 79.2 kg   Examination: Physical Exam:  Constitutional: Overweight African-American female currently no acute distress Respiratory: Diminished to auscultation bilaterally, no wheezing, rales, rhonchi or crackles. Normal respiratory effort and patient is not tachypenic. No accessory muscle use.  Unlabored breathing Cardiovascular: RRR, no murmurs / rubs / gallops. S1 and S2 auscultated. No extremity edema.   Abdomen: Soft, non-tender, distended secondary to body habitus bowel sounds positive.  GU: Deferred. Musculoskeletal: No clubbing / cyanosis of digits/nails. No joint deformity upper and lower extremities.  Skin: No rashes, lesions, ulcers on limited skin evaluation. No induration; Warm and dry.  Neurologic: CN 2-12 grossly intact with no focal  deficits. Romberg sign and cerebellar reflexes not assessed.  Psychiatric: Normal judgment and insight. Alert and oriented x 3. Normal mood and appropriate affect.   Data Reviewed: I have personally reviewed following labs and imaging studies  CBC: Recent Labs  Lab 05/02/22 0828 05/06/22 0430 05/07/22 1016 05/08/22 0336  WBC 2.7* 12.5* 12.3* 9.6  NEUTROABS 1.9 10.3* 10.1* 7.7  HGB 11.0* 11.7* 9.4* 9.2*  HCT 33.5* 36.2 29.8* 28.8*  MCV 94.9 97.3 99.0 97.6  PLT 202 184 167 007   Basic Metabolic Panel: Recent Labs  Lab 05/02/22 0828 05/06/22 0430 05/06/22 0633 05/07/22 1016 05/08/22 0336  NA 137 140  --  140 138  K 2.8* 3.2*  --  3.3* 2.9*  CL 107 109  --  107 105  CO2 22 20*  --  23 25  GLUCOSE 113* 115*  --  82 84  BUN 7 6  --  <5* 5*  CREATININE 0.54 0.57  --  0.43* 0.36*  CALCIUM 8.6* 8.4*  --  7.8* 7.7*  MG  --   --  1.5* 2.0 2.0  PHOS  --   --  2.9 3.2 3.4   GFR: Estimated Creatinine Clearance: 86.6 mL/min (A) (by C-G formula based on SCr of 0.36 mg/dL (L)). Liver Function Tests: Recent Labs  Lab 05/02/22 0828 05/06/22 0430 05/07/22 1016 05/08/22 0336  AST 44* '25 20 18  '$ ALT 36 '26 20 17  '$ ALKPHOS 185* 219* 217* 216*  BILITOT 0.3 0.7 0.5 0.5  PROT 6.4* 6.0* 5.5* 5.4*  ALBUMIN 3.0* 2.8* 2.3* 2.3*   No results for input(s): "LIPASE", "AMYLASE" in the last 168 hours. No results for input(s): "AMMONIA" in the last 168 hours. Coagulation Profile: Recent Labs  Lab 05/06/22 0430  INR 1.3*   Cardiac Enzymes: No results for input(s): "CKTOTAL", "CKMB", "CKMBINDEX", "TROPONINI" in the last 168 hours. BNP (last 3 results) No results for input(s): "PROBNP" in the last 8760 hours. HbA1C: No results for input(s): "HGBA1C" in the last 72 hours. CBG: No results for input(s): "GLUCAP" in the last 168 hours. Lipid Profile: No results for input(s): "CHOL", "HDL", "LDLCALC", "TRIG", "CHOLHDL", "LDLDIRECT" in the last 72 hours. Thyroid Function Tests: No results  for input(s): "TSH", "T4TOTAL", "FREET4", "T3FREE", "THYROIDAB" in the last 72 hours. Anemia Panel: No results for input(s): "VITAMINB12", "FOLATE", "FERRITIN", "TIBC", "IRON", "RETICCTPCT" in the last 72 hours. Sepsis Labs: Recent Labs  Lab 05/06/22 0430 05/06/22 6226  PROCALCITON  --  <0.10  LATICACIDVEN 1.3  --     Recent Results (from  the past 240 hour(s))  Culture, blood (Routine x 2)     Status: None (Preliminary result)   Collection Time: 05/06/22  4:30 AM   Specimen: BLOOD  Result Value Ref Range Status   Specimen Description   Final    BLOOD RIGHT ANTECUBITAL Performed at [facility] [address] [place], [place] [address]    Special Requests   Final    BOTTLES DRAWN AEROBIC AND ANAEROBIC Blood Culture adequate volume Performed at [facility] [address], [place], [place] [address]    Culture   Final    NO GROWTH 2 DAYS Performed at [facility] [address], [place], [place] [address]    Report Status PENDING  Incomplete  Resp panel by RT-PCR (RSV, Flu A&B, Covid) Anterior Nasal Swab     Status: None   Collection Time: 05/06/22  6:48 AM   Specimen: Anterior Nasal Swab  Result Value Ref Range Status   SARS Coronavirus 2 by RT PCR NEGATIVE NEGATIVE Final    Comment: (NOTE) SARS-CoV-2 target nucleic acids are NOT DETECTED.  The SARS-CoV-2 RNA is generally detectable in upper respiratory specimens during the acute phase of infection. The lowest concentration of SARS-CoV-2 viral copies this assay can detect is 138 copies/mL. A negative result does not preclude SARS-Cov-2 infection and should not be used as the sole basis for treatment or other patient management decisions. A negative result may occur with  improper specimen collection/handling, submission of specimen other than nasopharyngeal swab, presence of viral mutation(s) within the areas targeted by this assay, and inadequate number of  viral copies(<138 copies/mL). A negative result must be combined with clinical observations, patient history, and epidemiological information. The expected result is Negative.  Fact Sheet for Patients:  EntrepreneurPulse.com.au  Fact Sheet for Healthcare Providers:  IncredibleEmployment.be  This test is no t yet approved or cleared by the [place] FDA and  has been authorized for detection and/or diagnosis of SARS-CoV-2 by FDA under an Emergency Use Authorization (EUA). This EUA will remain  in effect (meaning this test can be used) for the duration of the COVID-19 declaration under Section 564(b)(1) of the Act, 21 U.S.C.section 360bbb-3(b)(1), unless the authorization is terminated  or revoked sooner.       Influenza A by PCR NEGATIVE NEGATIVE Final   Influenza B by PCR NEGATIVE NEGATIVE Final    Comment: (NOTE) The Xpert Xpress SARS-CoV-2/FLU/RSV plus assay is intended as an aid in the diagnosis of influenza from Nasopharyngeal swab specimens and should not be used as a sole basis for treatment. Nasal washings and aspirates are unacceptable for Xpert Xpress SARS-CoV-2/FLU/RSV testing.  Fact Sheet for Patients: EntrepreneurPulse.com.au  Fact Sheet for Healthcare Providers: IncredibleEmployment.be  This test is not yet approved or cleared by the [place] FDA and has been authorized for detection and/or diagnosis of SARS-CoV-2 by FDA under an Emergency Use Authorization (EUA). This EUA will remain in effect (meaning this test can be used) for the duration of the COVID-19 declaration under Section 564(b)(1) of the Act, 21 U.S.C. section 360bbb-3(b)(1), unless the authorization is terminated or revoked.     Resp Syncytial Virus by PCR NEGATIVE NEGATIVE Final    Comment: (NOTE) Fact Sheet for Patients: EntrepreneurPulse.com.au  Fact Sheet for Healthcare  Providers: IncredibleEmployment.be  This test is not yet approved or cleared by the [place] FDA and has been authorized for detection and/or diagnosis of SARS-CoV-2 by FDA under an Emergency Use Authorization (EUA).  This EUA will remain in effect (meaning this test can be used) for the duration of the COVID-19 declaration under Section 564(b)(1) of the Act, 21 U.S.C. section 360bbb-3(b)(1), unless the authorization is terminated or revoked.  Performed at [facility], [place] [address], [place], [place] [address]     Radiology Studies: DG CHEST PORT 1 VIEW  Result Date: 05/08/2022 CLINICAL DATA:  Shortness of breath. EXAM: PORTABLE CHEST 1 VIEW COMPARISON:  05/06/2022 FINDINGS: There is a right chest wall port a catheter with tip in the SVC. Stable cardiomediastinal contours. Persistent airspace consolidation involving nearly the entire left lower lobe. Right lung is clear. IMPRESSION: Persistent left lower lobe airspace consolidation compatible with pneumonia. Electronically Signed   By: Kerby Moors M.D.   On: 05/08/2022 06:49     Scheduled Meds:  Chlorhexidine Gluconate Cloth  6 each Topical Daily   guaiFENesin  1,200 mg Oral BID   ipratropium-albuterol  3 mL Nebulization Q6H   Continuous Infusions:  sodium chloride Stopped (05/07/22 2137)   azithromycin 500 mg (05/08/22 0420)   ceFEPime (MAXIPIME) IV 2 g (05/08/22 1433)    LOS: 2 days   Raiford Noble, DO [facility] Available via Epic secure chat 7am-7pm After these hours, please refer to coverage provider listed on [email or web address] 05/08/2022, 8:00 PM

## 2022-05-08 NOTE — Progress Notes (Signed)
Makayla Good is now up on 4 W.  She looks quite good.  She had a chest x-ray this morning which still shows [place] consolidation in the left lower lung.  She is not on any supplemental oxygen which is nice to see..  She continues on IV antibiotics with azithromycin and Maxipime.  She does not complain of any pain.  There is no cough.  There is still little bit of wheezing.  I think she probably needs to have nebulizers on a schedule.  Her labs show white cell count of 9.6.  Hemoglobin 9.2.  Platelet count 180,000.  The BUN is 5 creatinine 0.36.  Calcium 7.7.  Albumin 2.3.  She is having no diarrhea.  Her appetite is marginal.  There is no nausea or vomiting.  She has had no headache.  Her vital signs show temperature of 99.  Pulse 110.  Blood pressure 110/72.  Her lungs sound relatively clear over on the right lung field.  There may be some slight decrease over the left side.  There is some wheezes on the left side.  Cardiac exam tachycardic but regular.  She has occasional extra beat.  Extremity shows no clubbing, cyanosis or edema.  Abdominal exam is soft.  There is no fluid wave.  There is no palpable liver or spleen tip.  Neurological exam shows no focal neurological deficits.  Makayla Good has metastatic pancreatic cancer.  She has had 1 cycle of chemotherapy.  Her tumor marker has come down.  Pression has pneumonia.  This is a left lower lobe pneumonia.  She is improving.  She is on no oxygen.  She has good oxygen saturation.  She continues on IV antibiotics.  Thankfully, she is not neutropenic.  I do not think this will be a problem for her.  However, her CBC needs to be watched every day or so.  I know that she will get incredible care from the wonderful staff on 4 W.  They are so compassionate and really take good care of our patients.  Lattie Haw, MD  Darlyn Chamber 17:14

## 2022-05-08 NOTE — Progress Notes (Signed)
.   Transition of Care [facility]) Screening Note   Patient Details  Name: Makayla Good Date of Birth: 30-Aug-1970   Transition of [facility]) CM/SW Contact:    Illene Regulus, LCSW Phone Number: 05/08/2022, 10:02 AM    Transition of Care Department [facility]) has reviewed patient and no TOC needs have been identified at this time. We will continue to monitor patient advancement through interdisciplinary progression rounds. If new patient transition needs arise, please place a TOC consult.

## 2022-05-08 NOTE — Progress Notes (Signed)
Pharmacy Antibiotic Note  Makayla Good is a 52 y.o. female admitted on 05/06/2022 with pneumonia.  Pharmacy has been consulted for Cefepime dosing.  ID: sepsis, CAP - Tmax 99.5, WBC 9.6, Scr <1  Antimicrobials this admission: 1/14 azith >> (1/19) 1/14 cefepime >>  Microbiology results: 1/14 BCx:   Plan: Con't Cefepime 2g IV q8hr. Dose ok for renal function. Pharmacy will sign off. Please reconsult for further dosing assitance.   Height: '5\' 5"'$  (165.1 cm) Weight: 79.2 kg (174 lb 9.7 oz) IBW/kg (Calculated) : 57  Temp (24hrs), Avg:98.7 F (37.1 C), Min:98.2 F (36.8 C), Max:99.1 F (37.3 C)  Recent Labs  Lab 05/02/22 0828 05/06/22 0430 05/07/22 1016 05/08/22 0336  WBC 2.7* 12.5* 12.3* 9.6  CREATININE 0.54 0.57 0.43* 0.36*  LATICACIDVEN  --  1.3  --   --     Estimated Creatinine Clearance: 86.6 mL/min (A) (by C-G formula based on SCr of 0.36 mg/dL (L)).    No Known Allergies  Makayla Good, PharmD, BCPS Clinical Staff Pharmacist [email or web address]  Makayla Good 05/08/2022 9:21 AM

## 2022-05-09 DIAGNOSIS — J189 Pneumonia, unspecified organism: Secondary | ICD-10-CM | POA: Diagnosis not present

## 2022-05-09 DIAGNOSIS — K219 Gastro-esophageal reflux disease without esophagitis: Secondary | ICD-10-CM | POA: Diagnosis not present

## 2022-05-09 DIAGNOSIS — E876 Hypokalemia: Secondary | ICD-10-CM | POA: Diagnosis not present

## 2022-05-09 LAB — IRON AND TIBC
Iron: 19 ug/dL — ABNORMAL LOW (ref 28–170)
Saturation Ratios: 14 % (ref 10.4–31.8)
TIBC: 134 ug/dL — ABNORMAL LOW (ref 250–450)
UIBC: 115 ug/dL

## 2022-05-09 LAB — CBC WITH DIFFERENTIAL/PLATELET
Abs Immature Granulocytes: 0.08 10*3/uL — ABNORMAL HIGH (ref 0.00–0.07)
Basophils Absolute: 0 10*3/uL (ref 0.0–0.1)
Basophils Relative: 0 %
Eosinophils Absolute: 0.1 10*3/uL (ref 0.0–0.5)
Eosinophils Relative: 1 %
HCT: 26.5 % — ABNORMAL LOW (ref 36.0–46.0)
Hemoglobin: 8.7 g/dL — ABNORMAL LOW (ref 12.0–15.0)
Immature Granulocytes: 1 %
Lymphocytes Relative: 5 %
Lymphs Abs: 0.5 10*3/uL — ABNORMAL LOW (ref 0.7–4.0)
MCH: 31.9 pg (ref 26.0–34.0)
MCHC: 32.8 g/dL (ref 30.0–36.0)
MCV: 97.1 fL (ref 80.0–100.0)
Monocytes Absolute: 1.3 10*3/uL — ABNORMAL HIGH (ref 0.1–1.0)
Monocytes Relative: 14 %
Neutro Abs: 7 10*3/uL (ref 1.7–7.7)
Neutrophils Relative %: 79 %
Platelets: 175 10*3/uL (ref 150–400)
RBC: 2.73 MIL/uL — ABNORMAL LOW (ref 3.87–5.11)
RDW: 17.5 % — ABNORMAL HIGH (ref 11.5–15.5)
WBC: 9 10*3/uL (ref 4.0–10.5)
nRBC: 0 % (ref 0.0–0.2)

## 2022-05-09 LAB — PHOSPHORUS: Phosphorus: 2.8 mg/dL (ref 2.5–4.6)

## 2022-05-09 LAB — RETICULOCYTES
Immature Retic Fract: 30.9 % — ABNORMAL HIGH (ref 2.3–15.9)
RBC.: 2.67 MIL/uL — ABNORMAL LOW (ref 3.87–5.11)
Retic Count, Absolute: 49.7 10*3/uL (ref 19.0–186.0)
Retic Ct Pct: 1.9 % (ref 0.4–3.1)

## 2022-05-09 LAB — COMPREHENSIVE METABOLIC PANEL
ALT: 19 U/L (ref 0–44)
AST: 23 U/L (ref 15–41)
Albumin: 1.9 g/dL — ABNORMAL LOW (ref 3.5–5.0)
Alkaline Phosphatase: 179 U/L — ABNORMAL HIGH (ref 38–126)
Anion gap: 8 (ref 5–15)
BUN: <5 mg/dL — ABNORMAL LOW (ref 6–20)
CO2: 24 mmol/L (ref 22–32)
Calcium: 7.6 mg/dL — ABNORMAL LOW (ref 8.9–10.3)
Chloride: 105 mmol/L (ref 98–111)
Creatinine, Ser: 0.41 mg/dL — ABNORMAL LOW (ref 0.44–1.00)
GFR, Estimated: >60 mL/min (ref >60)
Glucose, Bld: 85 mg/dL (ref 70–99)
Potassium: 3.6 mmol/L (ref 3.5–5.1)
Sodium: 137 mmol/L (ref 135–145)
Total Bilirubin: 0.6 mg/dL (ref 0.3–1.2)
Total Protein: 4.7 g/dL — ABNORMAL LOW (ref 6.5–8.1)

## 2022-05-09 LAB — FERRITIN: Ferritin: 775 ng/mL — ABNORMAL HIGH (ref 11–307)

## 2022-05-09 LAB — FOLATE: Folate: 14 ng/mL (ref >5.9)

## 2022-05-09 LAB — VITAMIN B12: Vitamin B-12: 4462 pg/mL — ABNORMAL HIGH (ref 180–914)

## 2022-05-09 LAB — MAGNESIUM: Magnesium: 2 mg/dL (ref 1.7–2.4)

## 2022-05-09 MED ORDER — FUROSEMIDE 10 MG/ML IJ SOLN
20.0000 mg | Freq: Once | INTRAMUSCULAR | Status: AC
  Administered 2022-05-09: 20 mg via INTRAVENOUS
  Filled 2022-05-09: qty 2

## 2022-05-09 MED ORDER — ENSURE MAX PROTEIN PO LIQD
11.0000 [oz_av] | Freq: Every day | ORAL | Status: DC
  Filled 2022-05-09 (×3): qty 330

## 2022-05-09 MED ORDER — IPRATROPIUM-ALBUTEROL 0.5-2.5 (3) MG/3ML IN SOLN
3.0000 mL | Freq: Two times a day (BID) | RESPIRATORY_TRACT | Status: DC
  Administered 2022-05-09 – 2022-05-12 (×6): 3 mL via RESPIRATORY_TRACT
  Filled 2022-05-09 (×6): qty 3

## 2022-05-09 MED ORDER — PROSOURCE PLUS PO LIQD
30.0000 mL | Freq: Three times a day (TID) | ORAL | Status: DC
  Administered 2022-05-09 – 2022-05-12 (×9): 30 mL via ORAL
  Filled 2022-05-09 (×10): qty 30

## 2022-05-09 NOTE — Progress Notes (Signed)
Mobility Specialist - Progress Note   05/09/22 1326  Mobility  Activity Ambulated with assistance in hallway  Level of Assistance Modified independent, requires aide device or extra time  Assistive Device Other (Comment) (IV Pole)  Distance Ambulated (ft) 350 ft  Activity Response Tolerated well  Mobility Referral Yes  $Mobility charge 1 Mobility   Pt received in bed and agreeable to mobility. C/o feeling tired towards EOS, but stated she was okay & eager to walk again later to regain strength. Pt to bathroom after session with all needs met & MD in room.   [facility]

## 2022-05-09 NOTE — Progress Notes (Signed)
PROGRESS NOTE    Makayla Good  ZOX:[id number] DOB: July 10, 1970 DOA: 05/06/2022 PCP: [name], DO    No chief complaint on file.   Brief Narrative:  The patient is a 52 year old overweight African-American female with a past medical history significant for but limited to asthma, eczema, history of fibroids, hypertension, history of left polycystic ovary, sleep apnea not on CPAP, history of pancreatic carcinoma last chemotherapy session on 08/17/2021 who was recently diagnosed with a community-acquired pneumonia on 05/02/2022 and given Omnicef in the outpatient setting however had progressively worsening cough and dyspnea since the beginning of the week. He was started on Omnicef as an outpatient by Dr. Nadine Counts improvement and she was coughing very frequently with occasional blood-tinged sputum associated pleuritic chest pain, dyspnea and chills. She denies any fevers at home but had a temperature of 100.8 in the triage area. She has been having a right-sided headache as well as nasal congestion and no rhinorrhea or sore throat. Denies any sick contacts and has occasional nausea and appetite with good and bad days. She came to the ED and initial vital signs showed that she is slightly febrile with a pulse of 140 respirations of 24 and O2 saturations of 94%. She received acetaminophen IV cefepime azithromycin and given IV fluid bolus. Imaging showed a progressive left lower lobe consolidation since 05/02/2021 and right lung was within normal limits. She is admitted for a left lower lobe community-acquired pneumonia with outpatient failure of antibiotics.  He is slowly improving and is not neutropenic.  She will be continued on antibiotics with IV cefepime and IV azithromycin.    Assessment & Plan:   Principal Problem:   CAP (community acquired pneumonia) Active Problems:   HLD (hyperlipidemia)   OSA (obstructive sleep apnea)   HTN (hypertension)   Hypokalemia    Gastroesophageal reflux disease   Pancreatic adenocarcinoma (HCC)   Hypomagnesemia  #1 CAP/left lower lobe -Patient admitted with left lower lobe pneumonia, failed outpatient therapy with antibiotics. On admission noted to have a temperature of 100.8, tachycardic, with nasal congestion and right-sided headache. -Chest x-ray done consistent with left lower lobe pneumonia. -Improving clinically. -Blood cultures done with no growth to date.  SARS coronavirus 2 PCR negative, influenza A and B PCR negative, RSV by PCR negative. -Continue Mucinex, scheduled DuoNebs, continue IV azithromycin, IV cefepime and could likely transition to oral antibiotics in the next 24 hours to complete a 7 to 10-day course of treatment. -Will give a dose of Lasix 20 mg IV x 1.  2.  OSA -Not on CPAP.  3.  Hypomagnesemia/hypokalemia -Repleted, magnesium at 2.0.  Potassium at 3.6.  4.  Anemia of chronic disease -H&H stable. -Transfusion threshold hemoglobin < 7.  5.  Hyperlipidemia -Not on therapy at this time. -Outpatient follow-up.  6.  Hypertension -Blood pressure currently stable. -Continue to hold antihypertensive medications.  7.  GERD -PPI.  8.  Pancreatic adenocarcinoma -Patient seen by hematology/oncology, Dr. [name] who assessed the patient, - Per Oncology tumor markers have improved nicely and they feel patient calcium may be responding to treatment.  -Posttreatment of pneumonia, oncology to determine when to resume treatment.    DVT prophylaxis: SCDs Code Status: Full Family Communication: Updated patient, sister at bedside. Disposition: Likely home when clinically improved.  Status is: Inpatient Remains inpatient appropriate because: Severity of illness   Consultants:  Hematology/oncology: Dr. [name]  Procedures:  Chest x-ray 05/06/2022, 05/08/2022,  Antimicrobials:  IV azithromycin 05/06/2022>>>> 05/11/2022 IV cefepime 05/06/2022>>>>>  Subjective: Standing up in room about  to go to the bathroom.  Sister at bedside.  Denies any chest pain.  Feels shortness of breath has improved since admission.  Stated just ambulated around in the hallway and subsequently a somewhat short of breath.  Objective: Vitals:   05/09/22 0500 05/09/22 0533 05/09/22 0844 05/09/22 1313  BP:  110/76  124/78  Pulse:  87  98  Resp:  18  16  Temp:  98.9 F (37.2 C)  98.2 F (36.8 C)  TempSrc:  Oral  Oral  SpO2:  99% 98% 94%  Weight: 80.3 kg     Height:        Intake/Output Summary (Last 24 hours) at 05/09/2022 1345 Last data filed at 05/09/2022 0136 Gross per 24 hour  Intake 1515.28 ml  Output --  Net 1515.28 ml   Filed Weights   05/07/22 1855 05/08/22 0500 05/09/22 0500  Weight: 76 kg 79.2 kg 80.3 kg    Examination:  General exam: NAD Respiratory system: left basilar crackles.  No wheezing.  Fair air movement.  Speaking in full sentences.  Cardiovascular system: S1 & S2 heard, RRR. No JVD, murmurs, rubs, gallops or clicks. No pedal edema. Gastrointestinal system: Abdomen is nondistended, soft and nontender. No organomegaly or masses felt. Normal bowel sounds heard. Central nervous system: Alert and oriented. No focal neurological deficits. Extremities: Symmetric 5 x 5 power. Skin: No rashes, lesions or ulcers Psychiatry: Judgement and insight appear normal. Mood & affect appropriate.     Data Reviewed: I have personally reviewed following labs and imaging studies  CBC: Recent Labs  Lab 05/06/22 0430 05/07/22 1016 05/08/22 0336 05/09/22 0346  WBC 12.5* 12.3* 9.6 9.0  NEUTROABS 10.3* 10.1* 7.7 7.0  HGB 11.7* 9.4* 9.2* 8.7*  HCT 36.2 29.8* 28.8* 26.5*  MCV 97.3 99.0 97.6 97.1  PLT 184 167 180 678    Basic Metabolic Panel: Recent Labs  Lab 05/06/22 0430 05/06/22 0633 05/07/22 1016 05/08/22 0336 05/09/22 0346  NA 140  --  140 138 137  K 3.2*  --  3.3* 2.9* 3.6  CL 109  --  107 105 105  CO2 20*  --  '23 25 24  '$ GLUCOSE 115*  --  82 84 85  BUN 6  --  <5*  5* <5*  CREATININE 0.57  --  0.43* 0.36* 0.41*  CALCIUM 8.4*  --  7.8* 7.7* 7.6*  MG  --  1.5* 2.0 2.0 2.0  PHOS  --  2.9 3.2 3.4 2.8    GFR: Estimated Creatinine Clearance: 87.1 mL/min (A) (by C-G formula based on SCr of 0.41 mg/dL (L)).  Liver Function Tests: Recent Labs  Lab 05/06/22 0430 05/07/22 1016 05/08/22 0336 05/09/22 0346  AST '25 20 18 23  '$ ALT '26 20 17 19  '$ ALKPHOS 219* 217* 216* 179*  BILITOT 0.7 0.5 0.5 0.6  PROT 6.0* 5.5* 5.4* 4.7*  ALBUMIN 2.8* 2.3* 2.3* 1.9*    CBG: No results for input(s): "GLUCAP" in the last 168 hours.   Recent Results (from the past 240 hour(s))  Culture, blood (Routine x 2)     Status: None (Preliminary result)   Collection Time: 05/06/22  4:30 AM   Specimen: BLOOD  Result Value Ref Range Status   Specimen Description   Final    BLOOD RIGHT ANTECUBITAL Performed at [facility] [address], [place], [place] [address]    Special Requests   Final    BOTTLES DRAWN AEROBIC  AND ANAEROBIC Blood Culture adequate volume Performed at [facility] [address] [place], [place] [address]    Culture   Final    NO GROWTH 2 DAYS Performed at [facility] [address], [place], [place] [address]    Report Status PENDING  Incomplete  Resp panel by RT-PCR (RSV, Flu A&B, Covid) Anterior Nasal Swab     Status: None   Collection Time: 05/06/22  6:48 AM   Specimen: Anterior Nasal Swab  Result Value Ref Range Status   SARS Coronavirus 2 by RT PCR NEGATIVE NEGATIVE Final    Comment: (NOTE) SARS-CoV-2 target nucleic acids are NOT DETECTED.  The SARS-CoV-2 RNA is generally detectable in upper respiratory specimens during the acute phase of infection. The lowest concentration of SARS-CoV-2 viral copies this assay can detect is 138 copies/mL. A negative result does not preclude SARS-Cov-2 infection and should not be used as the sole basis for treatment or other patient management decisions. A  negative result may occur with  improper specimen collection/handling, submission of specimen other than nasopharyngeal swab, presence of viral mutation(s) within the areas targeted by this assay, and inadequate number of viral copies(<138 copies/mL). A negative result must be combined with clinical observations, patient history, and epidemiological information. The expected result is Negative.  Fact Sheet for Patients:  EntrepreneurPulse.com.au  Fact Sheet for Healthcare Providers:  IncredibleEmployment.be  This test is no t yet approved or cleared by the [place] FDA and  has been authorized for detection and/or diagnosis of SARS-CoV-2 by FDA under an Emergency Use Authorization (EUA). This EUA will remain  in effect (meaning this test can be used) for the duration of the COVID-19 declaration under Section 564(b)(1) of the Act, 21 U.S.C.section 360bbb-3(b)(1), unless the authorization is terminated  or revoked sooner.       Influenza A by PCR NEGATIVE NEGATIVE Final   Influenza B by PCR NEGATIVE NEGATIVE Final    Comment: (NOTE) The Xpert Xpress SARS-CoV-2/FLU/RSV plus assay is intended as an aid in the diagnosis of influenza from Nasopharyngeal swab specimens and should not be used as a sole basis for treatment. Nasal washings and aspirates are unacceptable for Xpert Xpress SARS-CoV-2/FLU/RSV testing.  Fact Sheet for Patients: EntrepreneurPulse.com.au  Fact Sheet for Healthcare Providers: IncredibleEmployment.be  This test is not yet approved or cleared by the [place] FDA and has been authorized for detection and/or diagnosis of SARS-CoV-2 by FDA under an Emergency Use Authorization (EUA). This EUA will remain in effect (meaning this test can be used) for the duration of the COVID-19 declaration under Section 564(b)(1) of the Act, 21 U.S.C. section 360bbb-3(b)(1), unless the authorization  is terminated or revoked.     Resp Syncytial Virus by PCR NEGATIVE NEGATIVE Final    Comment: (NOTE) Fact Sheet for Patients: EntrepreneurPulse.com.au  Fact Sheet for Healthcare Providers: IncredibleEmployment.be  This test is not yet approved or cleared by the [place] FDA and has been authorized for detection and/or diagnosis of SARS-CoV-2 by FDA under an Emergency Use Authorization (EUA). This EUA will remain in effect (meaning this test can be used) for the duration of the COVID-19 declaration under Section 564(b)(1) of the Act, 21 U.S.C. section 360bbb-3(b)(1), unless the authorization is terminated or revoked.  Performed at [facility], [place] [address] [place], [place] [address]          Radiology Studies: DG CHEST PORT 1 VIEW  Result Date: 05/08/2022 CLINICAL DATA:  Shortness of breath  EXAM: PORTABLE CHEST 1 VIEW COMPARISON:  Film from earlier in the same day. FINDINGS: Cardiac shadow is stable. Right chest wall port is again seen. Persistent left basilar infiltrate with associated effusion is noted. Right lung remains clear. No significant change is seen. IMPRESSION: Stable left lower lobe pneumonia. Electronically Signed   By: Inez Catalina M.D.   On: 05/08/2022 22:01   DG CHEST PORT 1 VIEW  Result Date: 05/08/2022 CLINICAL DATA:  Shortness of breath. EXAM: PORTABLE CHEST 1 VIEW COMPARISON:  05/06/2022 FINDINGS: There is a right chest wall port a catheter with tip in the SVC. Stable cardiomediastinal contours. Persistent airspace consolidation involving nearly the entire left lower lobe. Right lung is clear. IMPRESSION: Persistent left lower lobe airspace consolidation compatible with pneumonia. Electronically Signed   By: Kerby Moors M.D.   On: 05/08/2022 06:49        Scheduled Meds:  (feeding supplement) PROSource Plus  30 mL Oral TID BM   Chlorhexidine Gluconate Cloth  6 each Topical Daily    guaiFENesin  1,200 mg Oral BID   ipratropium-albuterol  3 mL Nebulization BID   Continuous Infusions:  sodium chloride Stopped (05/07/22 2137)   azithromycin 500 mg (05/09/22 0553)   ceFEPime (MAXIPIME) IV 2 g (05/09/22 0546)     LOS: 3 days    Time spent: 40 minutes    Irine Seal, MD [facility]   To contact the attending provider between 7A-7P or the covering provider during after hours 7P-7A, please log into the web site [email or web address] and access using universal [place] password for that web site. If you do not have the password, please call the hospital operator.  05/09/2022, 1:45 PM

## 2022-05-09 NOTE — Progress Notes (Signed)
Initial Nutrition Assessment  DOCUMENTATION CODES:   Not applicable  INTERVENTION:  - Regular diet.  - Ensure Max po BID, each supplement provides 150 kcal and 30 grams of protein.  - Prosource Plus BID, each supplement provides 100 kcal and 15 grams of protein. - Encourage intake as tolerated.  - Monitor weight trends.   NUTRITION DIAGNOSIS:   Unintentional weight loss related to chronic illness (pancreatic cancer) as evidenced by percent weight loss (17.6% weight loss in 3.5 months).  GOAL:   Patient will meet greater than or equal to 90% of their needs  MONITOR:   PO intake, Supplement acceptance, Weight trends  REASON FOR ASSESSMENT:   Malnutrition Screening Tool    ASSESSMENT:   52 y.o. female with medical history significant of asthma, eczema, fibroids, HTN, left polycystic ovary, pancreatic carcinoma with last chemotherapy session on 04/18/2022 who was diagnosed on 05/02/2022 with community-acquired pneumonia.  Patient resting in bed at time of visit. She reports UBW of 207# and weight loss over the past few months since being diagnosed with cancer. Per EMR, patient weighed at 204# in October and now weighed at 168# - a 36# or 17.6% weight loss in 3.5 months, significant for the time frame.  Patient reports she tries to eat 3 small meals and some snacks at home. 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 However, she understands she has increased nutritional needs with cancer. Patient admits her appetite has been poor and she has been experiencing nausea after chemo which has hindered intake. Has been eating simply because she know she should.  Patient is documented to have had 25-50% of meals since admission. Reports eating  well this AM with almost all her omelet as well as almost all a muffin. She is agreeable to try nutrition supplements during admission. She notes she can't drink very sweet things, agreeable to try ProSource Plus and Ensure Max.  Medications reviewed and include: -  Labs reviewed:  -   NUTRITION - FOCUSED PHYSICAL EXAM:  Flowsheet Row Most Recent Value  Orbital Region No depletion  Upper Arm Region No depletion  Thoracic and Lumbar Region No depletion  Buccal Region No depletion  Temple Region Mild depletion  Clavicle Bone Region No depletion  Clavicle and Acromion Bone Region No depletion  Scapular Bone Region Unable to assess  Dorsal Hand No depletion  Patellar Region No depletion  Anterior Thigh Region No depletion  Posterior Calf Region No depletion  Edema (RD Assessment) Mild  Hair Reviewed  Eyes Reviewed  Mouth Reviewed  Skin Reviewed  Nails Reviewed       Diet Order:   Diet Order             Diet regular Room service appropriate? Yes; Fluid consistency: Thin  Diet effective now                   EDUCATION NEEDS:  Education needs have been addressed  Skin:  Skin Assessment: Reviewed RN Assessment  Last BM:  1/15  Height:  Ht Readings from Last 1 Encounters:  05/07/22 '5\' 5"'$  (1.651 m)   Weight:  Wt Readings from Last 1 Encounters:  05/07/22 76 kg   BMI:  Body mass index is 27.88 kg/m.  Estimated Nutritional Needs:  Kcal:  2150-2450 kcals Protein:  90-105 grams Fluid:  >/= 2.1L    Makayla Good RD, LDN For contact information, refer to [facility].

## 2022-05-10 DIAGNOSIS — E785 Hyperlipidemia, unspecified: Secondary | ICD-10-CM

## 2022-05-10 DIAGNOSIS — E876 Hypokalemia: Secondary | ICD-10-CM

## 2022-05-10 DIAGNOSIS — J189 Pneumonia, unspecified organism: Secondary | ICD-10-CM

## 2022-05-10 DIAGNOSIS — C259 Malignant neoplasm of pancreas, unspecified: Secondary | ICD-10-CM

## 2022-05-10 DIAGNOSIS — I10 Essential (primary) hypertension: Secondary | ICD-10-CM

## 2022-05-10 DIAGNOSIS — K219 Gastro-esophageal reflux disease without esophagitis: Secondary | ICD-10-CM

## 2022-05-10 LAB — COMPREHENSIVE METABOLIC PANEL
ALT: 19 U/L (ref 0–44)
AST: 26 U/L (ref 15–41)
Albumin: 2 g/dL — ABNORMAL LOW (ref 3.5–5.0)
Alkaline Phosphatase: 186 U/L — ABNORMAL HIGH (ref 38–126)
Anion gap: 9 (ref 5–15)
BUN: <5 mg/dL — ABNORMAL LOW (ref 6–20)
CO2: 25 mmol/L (ref 22–32)
Calcium: 8.1 mg/dL — ABNORMAL LOW (ref 8.9–10.3)
Chloride: 106 mmol/L (ref 98–111)
Creatinine, Ser: 0.45 mg/dL (ref 0.44–1.00)
GFR, Estimated: >60 mL/min (ref >60)
Glucose, Bld: 90 mg/dL (ref 70–99)
Potassium: 3.1 mmol/L — ABNORMAL LOW (ref 3.5–5.1)
Sodium: 140 mmol/L (ref 135–145)
Total Bilirubin: 0.7 mg/dL (ref 0.3–1.2)
Total Protein: 5.2 g/dL — ABNORMAL LOW (ref 6.5–8.1)

## 2022-05-10 LAB — CBC
HCT: 27.1 % — ABNORMAL LOW (ref 36.0–46.0)
Hemoglobin: 8.7 g/dL — ABNORMAL LOW (ref 12.0–15.0)
MCH: 31.1 pg (ref 26.0–34.0)
MCHC: 32.1 g/dL (ref 30.0–36.0)
MCV: 96.8 fL (ref 80.0–100.0)
Platelets: 187 10*3/uL (ref 150–400)
RBC: 2.8 MIL/uL — ABNORMAL LOW (ref 3.87–5.11)
RDW: 17.2 % — ABNORMAL HIGH (ref 11.5–15.5)
WBC: 8.1 10*3/uL (ref 4.0–10.5)
nRBC: 0 % (ref 0.0–0.2)

## 2022-05-10 MED ORDER — POTASSIUM CHLORIDE CRYS ER 20 MEQ PO TBCR
40.0000 meq | EXTENDED_RELEASE_TABLET | ORAL | Status: AC
  Administered 2022-05-10 (×2): 40 meq via ORAL
  Filled 2022-05-10 (×2): qty 2

## 2022-05-10 MED ORDER — LEVOFLOXACIN 750 MG PO TABS
750.0000 mg | ORAL_TABLET | Freq: Every day | ORAL | Status: DC
  Administered 2022-05-11 – 2022-05-12 (×2): 750 mg via ORAL
  Filled 2022-05-10 (×3): qty 1

## 2022-05-10 MED ORDER — FUROSEMIDE 10 MG/ML IJ SOLN
40.0000 mg | Freq: Once | INTRAMUSCULAR | Status: AC
  Administered 2022-05-10: 40 mg via INTRAVENOUS
  Filled 2022-05-10: qty 4

## 2022-05-10 MED ORDER — ALBUMIN HUMAN 25 % IV SOLN
25.0000 g | Freq: Once | INTRAVENOUS | Status: AC
  Administered 2022-05-10: 25 g via INTRAVENOUS
  Filled 2022-05-10: qty 100

## 2022-05-10 MED ORDER — SODIUM CHLORIDE 0.9 % IV SOLN
2.0000 g | Freq: Three times a day (TID) | INTRAVENOUS | Status: AC
  Administered 2022-05-10 (×2): 2 g via INTRAVENOUS
  Filled 2022-05-10 (×2): qty 12.5

## 2022-05-10 NOTE — Progress Notes (Signed)
PROGRESS NOTE    Makayla Good  QQI:[id number] DOB: [birth year] DOA: 05/06/2022 PCP: [name], DO    No chief complaint on file.   Brief Narrative:  The patient is a 52 year old overweight African-American female with a past medical history significant for but limited to asthma, eczema, history of fibroids, hypertension, history of left polycystic ovary, sleep apnea not on CPAP, history of pancreatic carcinoma last chemotherapy session on 08/17/2021 who was recently diagnosed with a community-acquired pneumonia on 05/02/2022 and given Omnicef in the outpatient setting however had progressively worsening cough and dyspnea since the beginning of the week. He was started on Omnicef as an outpatient by Dr. Nadine Counts improvement and she was coughing very frequently with occasional blood-tinged sputum associated pleuritic chest pain, dyspnea and chills. She denies any fevers at home but had a temperature of 100.8 in the triage area. She has been having a right-sided headache as well as nasal congestion and no rhinorrhea or sore throat. Denies any sick contacts and has occasional nausea and appetite with good and bad days. She came to the ED and initial vital signs showed that she is slightly febrile with a pulse of 140 respirations of 24 and O2 saturations of 94%. She received acetaminophen IV cefepime azithromycin and given IV fluid bolus. Imaging showed a progressive left lower lobe consolidation since 05/02/2021 and right lung was within normal limits. She is admitted for a left lower lobe community-acquired pneumonia with outpatient failure of antibiotics.  He is slowly improving and is not neutropenic.  She will be continued on antibiotics with IV cefepime and IV azithromycin.    Assessment & Plan:   Principal Problem:   CAP (community acquired pneumonia) Active Problems:   HLD (hyperlipidemia)   OSA (obstructive sleep apnea)   HTN (hypertension)   Hypokalemia    Gastroesophageal reflux disease   Pancreatic adenocarcinoma (HCC)   Hypomagnesemia  #1 CAP/left lower lobe -Patient admitted with left lower lobe pneumonia, failed outpatient therapy with antibiotics. On admission noted to have a temperature of 100.8, tachycardic, with nasal congestion and right-sided headache. -Chest x-ray done consistent with left lower lobe pneumonia. -Improving clinically. -Blood cultures done with no growth to date.  SARS coronavirus 2 PCR negative, influenza A and B PCR negative, RSV by PCR negative. -Continue Mucinex, scheduled DuoNebs, continue IV azithromycin, IV cefepime and could likely transition to oral antibiotics in the next 24 hours to complete a 7 to 10-day course of treatment. -Patient given Lasix 20 mg IV x 1 with significant urine output per patient over the past 24 hours however not properly recorded. -Will give a dose of IV albumin followed by Lasix 40 mg IV x 1 today, monitor urine output.  2.  OSA -Not on CPAP.  3.  Hypomagnesemia/hypokalemia -Potassium at 3.1, K-Dur 40 mEq p.o. every 4 hours x 2 doses.   -Magnesium at 2.0.   -Patient to receive IV Lasix again today, repeat labs in the AM.   4.  Anemia of chronic disease -Hemoglobin stable at 8.7. -Transfusion threshold hemoglobin < 7.  5.  Hyperlipidemia -Not on therapy at this time. -Outpatient follow-up.  6.  Hypertension -Blood pressure stable.  Continue to hold antihypertensive medications.    7.  GERD -PPI.  8.  Pancreatic adenocarcinoma -Patient seen by hematology/oncology, Dr. [name] who assessed the patient, - Per Oncology tumor markers have improved nicely and they feel patient cancer may be responding to treatment.  -Posttreatment of pneumonia, oncology to determine when  to resume treatment.    DVT prophylaxis: SCDs Code Status: Full Family Communication: Updated patient, sister at bedside. Disposition: Likely home when clinically improved and on oral antibiotics  hopefully in the next 24 hours..  Status is: Inpatient Remains inpatient appropriate because: Severity of illness   Consultants:  Hematology/oncology: Dr. [name]  Procedures:  Chest x-ray 05/06/2022, 05/08/2022,  Antimicrobials:  IV azithromycin 05/06/2022>>>> 05/11/2022 IV cefepime 05/06/2022>>>>>   Subjective: Significant UOP yetserday per patient.. Still with left sided pleuritic pain. Some improvement with SOB on ambulation.  Objective: Vitals:   05/10/22 0003 05/10/22 0500 05/10/22 0632 05/10/22 0810  BP:   125/83   Pulse:   98   Resp:      Temp: 98.3 F (36.8 C)  99.2 F (37.3 C)   TempSrc: Oral  Oral   SpO2:   97% 98%  Weight:  78.9 kg    Height:        Intake/Output Summary (Last 24 hours) at 05/10/2022 1245 Last data filed at 05/09/2022 2130 Gross per 24 hour  Intake 600 ml  Output --  Net 600 ml    Filed Weights   05/08/22 0500 05/09/22 0500 05/10/22 0500  Weight: 79.2 kg 80.3 kg 78.9 kg    Examination:  General exam: NAD Respiratory system: Decreasing left basilar crackles.  No wheezes.  Fair air movement.  Speaking in full sentences.  Cardiovascular system: RRR no murmurs rubs or gallops.  No JVD.  No lower extremity edema.   Gastrointestinal system: Abdomen is soft, nontender, nondistended, positive bowel sounds.  No rebound.  No guarding.   Central nervous system: Alert and oriented. No focal neurological deficits. Extremities: Symmetric 5 x 5 power. Skin: No rashes, lesions or ulcers Psychiatry: Judgement and insight appear normal. Mood & affect appropriate.     Data Reviewed: I have personally reviewed following labs and imaging studies  CBC: Recent Labs  Lab 05/06/22 0430 05/07/22 1016 05/08/22 0336 05/09/22 0346 05/10/22 0333  WBC 12.5* 12.3* 9.6 9.0 8.1  NEUTROABS 10.3* 10.1* 7.7 7.0  --   HGB 11.7* 9.4* 9.2* 8.7* 8.7*  HCT 36.2 29.8* 28.8* 26.5* 27.1*  MCV 97.3 99.0 97.6 97.1 96.8  PLT 184 167 180 175 187     Basic Metabolic  Panel: Recent Labs  Lab 05/06/22 0430 05/06/22 0633 05/07/22 1016 05/08/22 0336 05/09/22 0346 05/10/22 0333  NA 140  --  140 138 137 140  K 3.2*  --  3.3* 2.9* 3.6 3.1*  CL 109  --  107 105 105 106  CO2 20*  --  '23 25 24 25  '$ GLUCOSE 115*  --  82 84 85 90  BUN 6  --  <5* 5* <5* <5*  CREATININE 0.57  --  0.43* 0.36* 0.41* 0.45  CALCIUM 8.4*  --  7.8* 7.7* 7.6* 8.1*  MG  --  1.5* 2.0 2.0 2.0  --   PHOS  --  2.9 3.2 3.4 2.8  --      GFR: Estimated Creatinine Clearance: 86.4 mL/min (by C-G formula based on SCr of 0.45 mg/dL).  Liver Function Tests: Recent Labs  Lab 05/06/22 0430 05/07/22 1016 05/08/22 0336 05/09/22 0346 05/10/22 0333  AST '25 20 18 23 26  '$ ALT '26 20 17 19 19  '$ ALKPHOS 219* 217* 216* 179* 186*  BILITOT 0.7 0.5 0.5 0.6 0.7  PROT 6.0* 5.5* 5.4* 4.7* 5.2*  ALBUMIN 2.8* 2.3* 2.3* 1.9* 2.0*     CBG: No results for input(s): "GLUCAP" in  the last 168 hours.   Recent Results (from the past 240 hour(s))  Culture, blood (Routine x 2)     Status: None (Preliminary result)   Collection Time: 05/06/22  4:30 AM   Specimen: BLOOD  Result Value Ref Range Status   Specimen Description   Final    BLOOD RIGHT ANTECUBITAL Performed at [facility] [address] [place], [place] [address]    Special Requests   Final    BOTTLES DRAWN AEROBIC AND ANAEROBIC Blood Culture adequate volume Performed at [facility] [address], [place], [place] [address]    Culture   Final    NO GROWTH 4 DAYS Performed at [facility], [place] [address], [place],  [address]    Report Status PENDING  Incomplete  Resp panel by RT-PCR (RSV, Flu A&B, Covid) Anterior Nasal Swab     Status: None   Collection Time: 05/06/22  6:48 AM   Specimen: Anterior Nasal Swab  Result Value Ref Range Status   SARS Coronavirus 2 by RT PCR NEGATIVE NEGATIVE Final    Comment: (NOTE) SARS-CoV-2 target nucleic acids are NOT DETECTED.  The SARS-CoV-2  RNA is generally detectable in upper respiratory specimens during the acute phase of infection. The lowest concentration of SARS-CoV-2 viral copies this assay can detect is 138 copies/mL. A negative result does not preclude SARS-Cov-2 infection and should not be used as the sole basis for treatment or other patient management decisions. A negative result may occur with  improper specimen collection/handling, submission of specimen other than nasopharyngeal swab, presence of viral mutation(s) within the areas targeted by this assay, and inadequate number of viral copies(<138 copies/mL). A negative result must be combined with clinical observations, patient history, and epidemiological information. The expected result is Negative.  Fact Sheet for Patients:  EntrepreneurPulse.com.au  Fact Sheet for Healthcare Providers:  IncredibleEmployment.be  This test is no t yet approved or cleared by the [place] FDA and  has been authorized for detection and/or diagnosis of SARS-CoV-2 by FDA under an Emergency Use Authorization (EUA). This EUA will remain  in effect (meaning this test can be used) for the duration of the COVID-19 declaration under Section 564(b)(1) of the Act, 21 U.S.C.section 360bbb-3(b)(1), unless the authorization is terminated  or revoked sooner.       Influenza A by PCR NEGATIVE NEGATIVE Final   Influenza B by PCR NEGATIVE NEGATIVE Final    Comment: (NOTE) The Xpert Xpress SARS-CoV-2/FLU/RSV plus assay is intended as an aid in the diagnosis of influenza from Nasopharyngeal swab specimens and should not be used as a sole basis for treatment. Nasal washings and aspirates are unacceptable for Xpert Xpress SARS-CoV-2/FLU/RSV testing.  Fact Sheet for Patients: EntrepreneurPulse.com.au  Fact Sheet for Healthcare Providers: IncredibleEmployment.be  This test is not yet approved or cleared by the  [place] FDA and has been authorized for detection and/or diagnosis of SARS-CoV-2 by FDA under an Emergency Use Authorization (EUA). This EUA will remain in effect (meaning this test can be used) for the duration of the COVID-19 declaration under Section 564(b)(1) of the Act, 21 U.S.C. section 360bbb-3(b)(1), unless the authorization is terminated or revoked.     Resp Syncytial Virus by PCR NEGATIVE NEGATIVE Final    Comment: (NOTE) Fact Sheet for Patients: EntrepreneurPulse.com.au  Fact Sheet for Healthcare Providers: IncredibleEmployment.be  This test is not yet approved or cleared by the [place] FDA and has been authorized for detection and/or diagnosis of  SARS-CoV-2 by FDA under an Emergency Use Authorization (EUA). This EUA will remain in effect (meaning this test can be used) for the duration of the COVID-19 declaration under Section 564(b)(1) of the Act, 21 U.S.C. section 360bbb-3(b)(1), unless the authorization is terminated or revoked.  Performed at [facility], [place] [address] [place], [place] [address]   Culture, blood (Routine x 2)     Status: None (Preliminary result)   Collection Time: 05/07/22 12:05 PM   Specimen: BLOOD  Result Value Ref Range Status   Specimen Description   Final    BLOOD PORTA CATH Performed at [facility] [address], [place], [place] [address]    Special Requests   Final    BOTTLES DRAWN AEROBIC AND ANAEROBIC Blood Culture adequate volume Performed at [facility] [address], [place], [place] [address]    Culture   Final    NO GROWTH 3 DAYS Performed at [facility], [place] [address], [place],  [address]    Report Status PENDING  Incomplete         Radiology Studies: DG CHEST PORT 1 VIEW  Result Date: 05/08/2022 CLINICAL DATA:  Shortness of breath EXAM: PORTABLE CHEST 1 VIEW COMPARISON:  Film from earlier  in the same day. FINDINGS: Cardiac shadow is stable. Right chest wall port is again seen. Persistent left basilar infiltrate with associated effusion is noted. Right lung remains clear. No significant change is seen. IMPRESSION: Stable left lower lobe pneumonia. Electronically Signed   By: Inez Catalina M.D.   On: 05/08/2022 22:01        Scheduled Meds:  (feeding supplement) PROSource Plus  30 mL Oral TID BM   Chlorhexidine Gluconate Cloth  6 each Topical Daily   guaiFENesin  1,200 mg Oral BID   ipratropium-albuterol  3 mL Nebulization BID   [START ON 05/11/2022] levofloxacin  750 mg Oral Daily   potassium chloride  40 mEq Oral Q4H   Ensure Max Protein  11 oz Oral Daily   Continuous Infusions:  sodium chloride Stopped (05/07/22 2137)   ceFEPime (MAXIPIME) IV       LOS: 4 days    Time spent: 40 minutes    Irine Seal, MD [facility]   To contact the attending provider between 7A-7P or the covering provider during after hours 7P-7A, please log into the web site [email or web address] and access using universal [place] password for that web site. If you do not have the password, please call the hospital operator.  05/10/2022, 12:45 PM

## 2022-05-10 NOTE — Progress Notes (Signed)
Mobility Specialist - Progress Note   05/10/22 1123  Mobility  Activity Ambulated with assistance in hallway  Level of Assistance Modified independent, requires aide device or extra time  Assistive Device Other (Comment) (IV Pole)  Distance Ambulated (ft) 490 ft  Activity Response Tolerated well  Mobility Referral Yes  $Mobility charge 1 Mobility   Pt received in recliner and agreeable to mobility. C/o L chest pain during mobility. Pt to recliner after session with all needs met.   [facility]

## 2022-05-10 NOTE — Progress Notes (Signed)
Mobility Specialist - Progress Note   05/10/22 1346  Mobility  Activity Ambulated with assistance in hallway  Level of Assistance Modified independent, requires aide device or extra time  Assistive Device Other (Comment)  Distance Ambulated (ft) 350 ft  Activity Response Tolerated well  Mobility Referral Yes  $Mobility charge 1 Mobility   Pt received in recliner and agreeable to mobility. No complaints during session. Pt to recliner after session with all needs met.   [facility]

## 2022-05-11 ENCOUNTER — Inpatient Hospital Stay (HOSPITAL_COMMUNITY): Payer: BC Managed Care – PPO

## 2022-05-11 DIAGNOSIS — J189 Pneumonia, unspecified organism: Secondary | ICD-10-CM | POA: Diagnosis not present

## 2022-05-11 DIAGNOSIS — K219 Gastro-esophageal reflux disease without esophagitis: Secondary | ICD-10-CM | POA: Diagnosis not present

## 2022-05-11 DIAGNOSIS — A419 Sepsis, unspecified organism: Secondary | ICD-10-CM | POA: Diagnosis not present

## 2022-05-11 DIAGNOSIS — E876 Hypokalemia: Secondary | ICD-10-CM | POA: Diagnosis not present

## 2022-05-11 LAB — RENAL FUNCTION PANEL
Albumin: 2.4 g/dL — ABNORMAL LOW (ref 3.5–5.0)
Anion gap: 12 (ref 5–15)
BUN: 6 mg/dL (ref 6–20)
CO2: 25 mmol/L (ref 22–32)
Calcium: 7.9 mg/dL — ABNORMAL LOW (ref 8.9–10.3)
Chloride: 105 mmol/L (ref 98–111)
Creatinine, Ser: 0.39 mg/dL — ABNORMAL LOW (ref 0.44–1.00)
GFR, Estimated: >60 mL/min (ref >60)
Glucose, Bld: 79 mg/dL (ref 70–99)
Phosphorus: 3.3 mg/dL (ref 2.5–4.6)
Potassium: 3.3 mmol/L — ABNORMAL LOW (ref 3.5–5.1)
Sodium: 142 mmol/L (ref 135–145)

## 2022-05-11 LAB — CBC
HCT: 26.1 % — ABNORMAL LOW (ref 36.0–46.0)
Hemoglobin: 8.2 g/dL — ABNORMAL LOW (ref 12.0–15.0)
MCH: 30.6 pg (ref 26.0–34.0)
MCHC: 31.4 g/dL (ref 30.0–36.0)
MCV: 97.4 fL (ref 80.0–100.0)
Platelets: 189 10*3/uL (ref 150–400)
RBC: 2.68 MIL/uL — ABNORMAL LOW (ref 3.87–5.11)
RDW: 17.3 % — ABNORMAL HIGH (ref 11.5–15.5)
WBC: 8.2 10*3/uL (ref 4.0–10.5)
nRBC: 0 % (ref 0.0–0.2)

## 2022-05-11 LAB — CULTURE, BLOOD (ROUTINE X 2)
Culture: NO GROWTH
Special Requests: ADEQUATE

## 2022-05-11 LAB — MAGNESIUM: Magnesium: 1.8 mg/dL (ref 1.7–2.4)

## 2022-05-11 MED ORDER — SODIUM CHLORIDE 0.9 % IV SOLN
510.0000 mg | Freq: Once | INTRAVENOUS | Status: AC
  Administered 2022-05-11: 510 mg via INTRAVENOUS
  Filled 2022-05-11: qty 510

## 2022-05-11 MED ORDER — FUROSEMIDE 10 MG/ML IJ SOLN
40.0000 mg | Freq: Once | INTRAMUSCULAR | Status: AC
  Administered 2022-05-11: 40 mg via INTRAVENOUS
  Filled 2022-05-11: qty 4

## 2022-05-11 MED ORDER — POTASSIUM CHLORIDE CRYS ER 20 MEQ PO TBCR
40.0000 meq | EXTENDED_RELEASE_TABLET | ORAL | Status: AC
  Administered 2022-05-11 (×2): 40 meq via ORAL
  Filled 2022-05-11 (×2): qty 2

## 2022-05-11 MED ORDER — MAGNESIUM SULFATE 2 GM/50ML IV SOLN
2.0000 g | Freq: Once | INTRAVENOUS | Status: AC
  Administered 2022-05-11: 2 g via INTRAVENOUS
  Filled 2022-05-11: qty 50

## 2022-05-11 MED ORDER — ALBUMIN HUMAN 25 % IV SOLN
25.0000 g | Freq: Once | INTRAVENOUS | Status: AC
  Administered 2022-05-11: 25 g via INTRAVENOUS
  Filled 2022-05-11: qty 100

## 2022-05-11 NOTE — Progress Notes (Signed)
PROGRESS NOTE    Makayla Good  OEU:[id number] DOB: [birth year] DOA: 05/06/2022 PCP: [name], DO    No chief complaint on file.   Brief Narrative:  The patient is a 52 year old overweight African-American female with a past medical history significant for but limited to asthma, eczema, history of fibroids, hypertension, history of left polycystic ovary, sleep apnea not on CPAP, history of pancreatic carcinoma last chemotherapy session on 08/17/2021 who was recently diagnosed with a community-acquired pneumonia on 05/02/2022 and given Omnicef in the outpatient setting however had progressively worsening cough and dyspnea since the beginning of the week. He was started on Omnicef as an outpatient by Dr. Nadine Counts improvement and she was coughing very frequently with occasional blood-tinged sputum associated pleuritic chest pain, dyspnea and chills. She denies any fevers at home but had a temperature of 100.8 in the triage area. She has been having a right-sided headache as well as nasal congestion and no rhinorrhea or sore throat. Denies any sick contacts and has occasional nausea and appetite with good and bad days. She came to the ED and initial vital signs showed that she is slightly febrile with a pulse of 140 respirations of 24 and O2 saturations of 94%. She received acetaminophen IV cefepime azithromycin and given IV fluid bolus. Imaging showed a progressive left lower lobe consolidation since 05/02/2021 and right lung was within normal limits. She is admitted for a left lower lobe community-acquired pneumonia with outpatient failure of antibiotics.  He is slowly improving and is not neutropenic.  She will be continued on antibiotics with IV cefepime and IV azithromycin.    Assessment & Plan:   Principal Problem:   CAP (community acquired pneumonia) Active Problems:   HLD (hyperlipidemia)   OSA (obstructive sleep apnea)   HTN (hypertension)   Hypokalemia    Gastroesophageal reflux disease   Pancreatic adenocarcinoma (HCC)   Hypomagnesemia   Sepsis ([place])  #1 CAP/left lower lobe -Patient admitted with left lower lobe pneumonia, failed outpatient therapy with antibiotics. On admission noted to have a temperature of 100.8, tachycardic, with nasal congestion and right-sided headache. -Chest x-ray done consistent with left lower lobe pneumonia. -Improving clinically. -Blood cultures done with no growth to date.  SARS coronavirus 2 PCR negative, influenza A and B PCR negative, RSV by PCR negative. -Continue Mucinex, scheduled DuoNebs, continue IV azithromycin, IV cefepime and could likely transition to oral antibiotics in the next 24 hours to complete a 7 to 10-day course of treatment. -Patient given IV Lasix over the past 2 to 3 days with good urine output and some clinical improvement.   -Patient received IV albumin with Lasix yesterday good urine output but not properly recorded.   -Give another dose of Lasix IV with IV albumin.   -Repeat chest x-ray with no significant change however chest x-ray likely lagging behind clinical picture.   -Transition from IV cefepime to Levaquin today to complete a course of antibiotic treatment.    2.  OSA -Not on CPAP.  3.  Hypomagnesemia/hypokalemia -Potassium at 3.3.  Magnesium at 1.8.   -Electrolyte abnormalities likely secondary to diuresis.   -Give another dose of IV Lasix as well as albumin.    4.  Anemia of chronic disease -Hemoglobin currently stable at 8.2.  -Transfusion threshold hemoglobin < 7.  5.  Hyperlipidemia -Not on therapy at this time. -Outpatient follow-up.  6.  Hypertension -BP stable.   -Continue to hold antihypertensive medications.    7.  GERD -PPI.  8.  Pancreatic adenocarcinoma -Patient seen by hematology/oncology, Dr. [name] who assessed the patient, - Per Oncology tumor markers have improved nicely and they feel patient cancer may be responding to treatment.   -Posttreatment of pneumonia, oncology to determine when to resume treatment.    DVT prophylaxis: SCDs Code Status: Full Family Communication: Updated patient, sister at bedside. Disposition: Likely home when clinically improved and on oral antibiotics hopefully tomorrow.   Status is: Inpatient Remains inpatient appropriate because: Severity of illness   Consultants:  Hematology/oncology: Dr. [name]  Procedures:  Chest x-ray 05/06/2022, 05/08/2022, IV iron 05/11/2020  Antimicrobials:  IV azithromycin 05/06/2022>>>> 05/11/2022 IV cefepime 05/06/2022>>>>> 05/11/2022 Oral Levaquin 05/11/2022>>>>   Subjective: Patient sitting up in bedside.  States has significant urine output.  Still with left-sided pleuritic pain however slowly improving since admission.  Some improvement with shortness of breath on exertion.  States saw her hematologist this morning.  Objective: Vitals:   05/11/22 0432 05/11/22 0500 05/11/22 0801 05/11/22 1234  BP: 110/76   122/87  Pulse: 95   (!) 110  Resp: 20   20  Temp: 99.5 F (37.5 C)   98.6 F (37 C)  TempSrc: Oral   Oral  SpO2: 92%  94% 96%  Weight:  81 kg    Height:        Intake/Output Summary (Last 24 hours) at 05/11/2022 1309 Last data filed at 05/11/2022 1100 Gross per 24 hour  Intake 1623 ml  Output 800 ml  Net 823 ml    Filed Weights   05/09/22 0500 05/10/22 0500 05/11/22 0500  Weight: 80.3 kg 78.9 kg 81 kg    Examination:  General exam: NAD. Respiratory system: Decreasing left sided crackles.  No wheezing.  Fair air movement.  Speaking in full sentences.  Cardiovascular system: Regular rate rhythm no murmurs rubs or gallops.  No JVD.  No lower extremity edema.  Gastrointestinal system: Abdomen is soft, nontender, nondistended, positive bowel sounds.  No rebound.  No guarding.  Central nervous system: Alert and oriented. No focal neurological deficits. Extremities: Symmetric 5 x 5 power. Skin: No rashes, lesions or  ulcers Psychiatry: Judgement and insight appear normal. Mood & affect appropriate.     Data Reviewed: I have personally reviewed following labs and imaging studies  CBC: Recent Labs  Lab 05/06/22 0430 05/07/22 1016 05/08/22 0336 05/09/22 0346 05/10/22 0333 05/11/22 0408  WBC 12.5* 12.3* 9.6 9.0 8.1 8.2  NEUTROABS 10.3* 10.1* 7.7 7.0  --   --   HGB 11.7* 9.4* 9.2* 8.7* 8.7* 8.2*  HCT 36.2 29.8* 28.8* 26.5* 27.1* 26.1*  MCV 97.3 99.0 97.6 97.1 96.8 97.4  PLT 184 167 180 175 187 189     Basic Metabolic Panel: Recent Labs  Lab 05/06/22 0633 05/07/22 1016 05/08/22 0336 05/09/22 0346 05/10/22 0333 05/11/22 0408  NA  --  140 138 137 140 142  K  --  3.3* 2.9* 3.6 3.1* 3.3*  CL  --  107 105 105 106 105  CO2  --  '23 25 24 25 25  '$ GLUCOSE  --  82 84 85 90 79  BUN  --  <5* 5* <5* <5* 6  CREATININE  --  0.43* 0.36* 0.41* 0.45 0.39*  CALCIUM  --  7.8* 7.7* 7.6* 8.1* 7.9*  MG 1.5* 2.0 2.0 2.0  --  1.8  PHOS 2.9 3.2 3.4 2.8  --  3.3     GFR: Estimated Creatinine Clearance: 87.5 mL/min (A) (by C-G formula based on SCr  of 0.39 mg/dL (L)).  Liver Function Tests: Recent Labs  Lab 05/06/22 0430 05/07/22 1016 05/08/22 0336 05/09/22 0346 05/10/22 0333 05/11/22 0408  AST '25 20 18 23 26  '$ --   ALT '26 20 17 19 19  '$ --   ALKPHOS 219* 217* 216* 179* 186*  --   BILITOT 0.7 0.5 0.5 0.6 0.7  --   PROT 6.0* 5.5* 5.4* 4.7* 5.2*  --   ALBUMIN 2.8* 2.3* 2.3* 1.9* 2.0* 2.4*     CBG: No results for input(s): "GLUCAP" in the last 168 hours.   Recent Results (from the past 240 hour(s))  Culture, blood (Routine x 2)     Status: None   Collection Time: 05/06/22  4:30 AM   Specimen: BLOOD  Result Value Ref Range Status   Specimen Description   Final    BLOOD RIGHT ANTECUBITAL Performed at [facility] [address] [place], [place] [address]    Special Requests   Final    BOTTLES DRAWN AEROBIC AND ANAEROBIC Blood Culture adequate volume Performed at [facility] [address], [place], [place] [address]    Culture   Final    NO GROWTH 5 DAYS Performed at [facility], [place] [address], [place], [place] [address]    Report Status 05/11/2022 FINAL  Final  Resp panel by RT-PCR (RSV, Flu A&B, Covid) Anterior Nasal Swab     Status: None   Collection Time: 05/06/22  6:48 AM   Specimen: Anterior Nasal Swab  Result Value Ref Range Status   SARS Coronavirus 2 by RT PCR NEGATIVE NEGATIVE Final    Comment: (NOTE) SARS-CoV-2 target nucleic acids are NOT DETECTED.  The SARS-CoV-2 RNA is generally detectable in upper respiratory specimens during the acute phase of infection. The lowest concentration of SARS-CoV-2 viral copies this assay can detect is 138 copies/mL. A negative result does not preclude SARS-Cov-2 infection and should not be used as the sole basis for treatment or other patient management decisions. A negative result may occur with  improper specimen collection/handling, submission of specimen other than nasopharyngeal swab, presence of viral mutation(s) within the areas targeted by this assay, and inadequate number of viral copies(<138 copies/mL). A negative result must be combined with clinical observations, patient history, and epidemiological information. The expected result is Negative.  Fact Sheet for Patients:  EntrepreneurPulse.com.au  Fact Sheet for Healthcare Providers:  IncredibleEmployment.be  This test is no t yet approved or cleared by the [place] FDA and  has been authorized for detection and/or diagnosis of SARS-CoV-2 by FDA under an Emergency Use Authorization (EUA). This EUA will remain  in effect (meaning this test can be used) for the duration of the COVID-19 declaration under Section 564(b)(1) of the Act, 21 U.S.C.section 360bbb-3(b)(1), unless the authorization is terminated  or revoked sooner.       Influenza A by PCR NEGATIVE NEGATIVE  Final   Influenza B by PCR NEGATIVE NEGATIVE Final    Comment: (NOTE) The Xpert Xpress SARS-CoV-2/FLU/RSV plus assay is intended as an aid in the diagnosis of influenza from Nasopharyngeal swab specimens and should not be used as a sole basis for treatment. Nasal washings and aspirates are unacceptable for Xpert Xpress SARS-CoV-2/FLU/RSV testing.  Fact Sheet for Patients: EntrepreneurPulse.com.au  Fact Sheet for Healthcare Providers: IncredibleEmployment.be  This test is not yet approved or cleared by the [place] FDA and has been authorized for detection and/or diagnosis of SARS-CoV-2 by FDA under an  Emergency Use Authorization (EUA). This EUA will remain in effect (meaning this test can be used) for the duration of the COVID-19 declaration under Section 564(b)(1) of the Act, 21 U.S.C. section 360bbb-3(b)(1), unless the authorization is terminated or revoked.     Resp Syncytial Virus by PCR NEGATIVE NEGATIVE Final    Comment: (NOTE) Fact Sheet for Patients: EntrepreneurPulse.com.au  Fact Sheet for Healthcare Providers: IncredibleEmployment.be  This test is not yet approved or cleared by the [place] FDA and has been authorized for detection and/or diagnosis of SARS-CoV-2 by FDA under an Emergency Use Authorization (EUA). This EUA will remain in effect (meaning this test can be used) for the duration of the COVID-19 declaration under Section 564(b)(1) of the Act, 21 U.S.C. section 360bbb-3(b)(1), unless the authorization is terminated or revoked.  Performed at [facility], [place] [address] [place], [place] [address]   Culture, blood (Routine x 2)     Status: None (Preliminary result)   Collection Time: 05/07/22 12:05 PM   Specimen: BLOOD  Result Value Ref Range Status   Specimen Description   Final    BLOOD PORTA CATH Performed at [facility]  [address], [place], [place] [address]    Special Requests   Final    BOTTLES DRAWN AEROBIC AND ANAEROBIC Blood Culture adequate volume Performed at [facility] [address], [place], [place] [address]    Culture   Final    NO GROWTH 4 DAYS Performed at [facility], [place] [address], [place], [place] [address]    Report Status PENDING  Incomplete         Radiology Studies: DG Chest 2 View  Result Date: 05/11/2022 CLINICAL DATA:  Followup PA and lateral chest X-ray is recommended in 3-4 weeks following trial of antibiotic therapy to ensure resolution and exclude underlying malignancy. EXAM: CHEST - 2 VIEW COMPARISON:  Chest x-ray May 08, 2022 FINDINGS: Stable positioning of the right chest wall port catheter, terminating in the low SVC. The cardiomediastinal silhouette is unchanged in contour. Unchanged confluent left basilar pulmonary opacity. Small left pleural effusion. No new focal pulmonary opacity. No large pneumothorax. The visualized upper abdomen is unremarkable. No acute osseous abnormality. IMPRESSION: Unchanged left basilar pneumonia and small left pleural effusion. Electronically Signed   By: Beryle Flock M.D.   On: 05/11/2022 10:06        Scheduled Meds:  (feeding supplement) PROSource Plus  30 mL Oral TID BM   Chlorhexidine Gluconate Cloth  6 each Topical Daily   furosemide  40 mg Intravenous Once   guaiFENesin  1,200 mg Oral BID   ipratropium-albuterol  3 mL Nebulization BID   levofloxacin  750 mg Oral Daily   potassium chloride  40 mEq Oral Q4H   Ensure Max Protein  11 oz Oral Daily   Continuous Infusions:  sodium chloride Stopped (05/07/22 2137)   albumin human     magnesium sulfate bolus IVPB       LOS: 5 days    Time spent: 40 minutes    Irine Seal, MD [facility]   To contact the attending provider between 7A-7P or the covering provider during after hours 7P-7A, please log into the web site  [email or web address] and access using universal [place] password for that web site. If you do not have the password, please call the hospital operator.  05/11/2022, 1:09 PM

## 2022-05-11 NOTE — Progress Notes (Signed)
Makayla Good looks little bit better.  She now is a on 4W.  She is not on any oxygen.  Cultures have all been negative.  She is still on IV antibiotics.  Hopefully, she can be moved over to oral antibiotics.  Her labs show white cell count of 8.2.  Hemoglobin 8.2.  Platelet count 189,000.  Her iron saturation is 14%.  I think that she probably needs some IV iron.  I think she will benefit from this.  Her electrolytes look okay.  Potassium might be a little bit low at 3.3.  Her albumin is 2.4.  She is still not eating all that much.  She just does not have much of an appetite.  There is been no diarrhea.  Her last chest x-ray was 3 days ago.  It might be worthwhile repeating a chest x-ray.  She has had no bleeding.  She has had no bruising.  She has had a low-grade temperature.  Her vital signs show temperature 99.5.  Pulse 95.  Blood pressure 110/76.  Her lungs sound clear bilaterally.  Cardiac exam is regular rate and rhythm.  Abdomen is soft.  Bowel sounds are present.  She has no fluid wave.  There is no guarding or rebound tenderness.  Extremity shows no clubbing, cyanosis or edema.  Ms. Billard does have pancreatic cancer.  She had 1 cycle of chemotherapy.  She comes in with pneumonia.  It may not be a bad idea to repeat a chest x-ray on her.  Again, hopefully the antibiotics will be switched over to something oral so she can go home.  I know that she is getting incredible care from everybody up on 4 W.  I appreciate everybody's help.  Lattie Haw, MD  Vonna Kotyk 1:9

## 2022-05-11 NOTE — Progress Notes (Signed)
Mobility Specialist - Progress Note   05/11/22 1139  Mobility  Activity Ambulated independently in hallway  Level of Assistance Independent  Assistive Device None  Distance Ambulated (ft) 350 ft  Activity Response Tolerated well  Mobility Referral Yes  $Mobility charge 1 Mobility   Pt received in bed and agreeable to mobility. Pt had a coughing spell once sitting up from bed. Pt stated feeling very fatigued today. Pt to bed after session with all needs met & sister in room.  [facility]

## 2022-05-11 NOTE — Plan of Care (Signed)

## 2022-05-11 NOTE — Progress Notes (Signed)
Mobility Specialist - Progress Note   05/11/22 1514  Mobility  Activity Ambulated independently in hallway  Level of Assistance Independent  Assistive Device None  Distance Ambulated (ft) 350 ft  Activity Response Tolerated well  Mobility Referral Yes  $Mobility charge 1 Mobility   Pt received in bed and agreeable to mobility. No complaints during session. Pt to EOB after session with all needs met.   [facility]

## 2022-05-12 ENCOUNTER — Encounter: Payer: Self-pay | Admitting: Hematology & Oncology

## 2022-05-12 ENCOUNTER — Other Ambulatory Visit (HOSPITAL_COMMUNITY): Payer: Self-pay

## 2022-05-12 DIAGNOSIS — E876 Hypokalemia: Secondary | ICD-10-CM | POA: Diagnosis not present

## 2022-05-12 DIAGNOSIS — C259 Malignant neoplasm of pancreas, unspecified: Secondary | ICD-10-CM | POA: Diagnosis not present

## 2022-05-12 DIAGNOSIS — D500 Iron deficiency anemia secondary to blood loss (chronic): Secondary | ICD-10-CM | POA: Diagnosis not present

## 2022-05-12 DIAGNOSIS — J189 Pneumonia, unspecified organism: Secondary | ICD-10-CM | POA: Diagnosis not present

## 2022-05-12 LAB — CBC
HCT: 26.4 % — ABNORMAL LOW (ref 36.0–46.0)
Hemoglobin: 8.5 g/dL — ABNORMAL LOW (ref 12.0–15.0)
MCH: 31.5 pg (ref 26.0–34.0)
MCHC: 32.2 g/dL (ref 30.0–36.0)
MCV: 97.8 fL (ref 80.0–100.0)
Platelets: 204 10*3/uL (ref 150–400)
RBC: 2.7 MIL/uL — ABNORMAL LOW (ref 3.87–5.11)
RDW: 17.6 % — ABNORMAL HIGH (ref 11.5–15.5)
WBC: 7.4 10*3/uL (ref 4.0–10.5)
nRBC: 0.3 % — ABNORMAL HIGH (ref 0.0–0.2)

## 2022-05-12 LAB — RENAL FUNCTION PANEL
Albumin: 2.7 g/dL — ABNORMAL LOW (ref 3.5–5.0)
Anion gap: 10 (ref 5–15)
BUN: 6 mg/dL (ref 6–20)
CO2: 25 mmol/L (ref 22–32)
Calcium: 8.2 mg/dL — ABNORMAL LOW (ref 8.9–10.3)
Chloride: 102 mmol/L (ref 98–111)
Creatinine, Ser: 0.38 mg/dL — ABNORMAL LOW (ref 0.44–1.00)
GFR, Estimated: >60 mL/min (ref >60)
Glucose, Bld: 66 mg/dL — ABNORMAL LOW (ref 70–99)
Phosphorus: 3 mg/dL (ref 2.5–4.6)
Potassium: 3.6 mmol/L (ref 3.5–5.1)
Sodium: 137 mmol/L (ref 135–145)

## 2022-05-12 LAB — CULTURE, BLOOD (ROUTINE X 2)
Culture: NO GROWTH
Special Requests: ADEQUATE

## 2022-05-12 LAB — MAGNESIUM: Magnesium: 2.3 mg/dL (ref 1.7–2.4)

## 2022-05-12 MED ORDER — ACETAMINOPHEN 325 MG PO TABS: 650.0000 mg | ORAL_TABLET | Freq: Four times a day (QID) | ORAL | Status: DC | PRN

## 2022-05-12 MED ORDER — POTASSIUM CHLORIDE CRYS ER 10 MEQ PO TBCR
40.0000 meq | EXTENDED_RELEASE_TABLET | Freq: Once | ORAL | Status: AC
  Administered 2022-05-12: 40 meq via ORAL
  Filled 2022-05-12: qty 4

## 2022-05-12 MED ORDER — GUAIFENESIN ER 600 MG PO TB12
1200.0000 mg | ORAL_TABLET | Freq: Two times a day (BID) | ORAL | 0 refills | Status: AC
  Filled 2022-05-12: qty 20, 5d supply, fill #0

## 2022-05-12 MED ORDER — PROSOURCE PLUS PO LIQD: 30.0000 mL | Freq: Three times a day (TID) | ORAL | Status: DC

## 2022-05-12 MED ORDER — HEPARIN SOD (PORK) LOCK FLUSH 100 UNIT/ML IV SOLN
500.0000 [IU] | INTRAVENOUS | Status: AC | PRN
  Administered 2022-05-12: 500 [IU]

## 2022-05-12 MED ORDER — HYDROCODONE BIT-HOMATROP MBR 5-1.5 MG/5ML PO SOLN
5.0000 mL | ORAL | 0 refills | Status: DC | PRN
  Filled 2022-05-12: qty 51, 2d supply, fill #0
  Filled 2022-05-12: qty 15, 1d supply, fill #0
  Filled 2022-05-12: qty 50, 2d supply, fill #0
  Filled 2022-05-12: qty 14, 1d supply, fill #0

## 2022-05-12 MED ORDER — HYDROCODONE BIT-HOMATROP MBR 5-1.5 MG/5ML PO SOLN: 5.0000 mL | ORAL | 0 refills | Status: DC | PRN

## 2022-05-12 MED ORDER — ENSURE MAX PROTEIN PO LIQD: 11.0000 [oz_av] | Freq: Every day | ORAL | Status: DC

## 2022-05-12 MED ORDER — LEVOFLOXACIN 750 MG PO TABS
750.0000 mg | ORAL_TABLET | Freq: Every day | ORAL | 0 refills | Status: AC
  Filled 2022-05-12: qty 2, 2d supply, fill #0

## 2022-05-12 MED ORDER — IPRATROPIUM-ALBUTEROL 20-100 MCG/ACT IN AERS: INHALATION_SPRAY | RESPIRATORY_TRACT | 1 refills | Status: DC

## 2022-05-12 MED ORDER — GUAIFENESIN ER 600 MG PO TB12: 1200.0000 mg | ORAL_TABLET | Freq: Two times a day (BID) | ORAL | 0 refills | Status: DC

## 2022-05-12 MED ORDER — LEVOFLOXACIN 750 MG PO TABS: 750.0000 mg | ORAL_TABLET | Freq: Every day | ORAL | 0 refills | Status: DC

## 2022-05-12 MED ORDER — IPRATROPIUM-ALBUTEROL 20-100 MCG/ACT IN AERS
INHALATION_SPRAY | RESPIRATORY_TRACT | 1 refills | Status: DC
  Filled 2022-05-12: qty 1, fill #0

## 2022-05-12 NOTE — Discharge Summary (Addendum)
Physician Discharge Summary  Makayla Good Makayla Good YBO:[id number] DOB: 1970/10/12 DOA: 05/06/2022  PCP: [name], DO  Admit date: 05/06/2022 Discharge date: 05/12/2022  Time spent: 60 minutes  Recommendations for Outpatient Follow-up:  Follow-up with [name], DO in 2 weeks.  On follow-up patient blood pressure need to be reassessed as patient antihypertensive medications were discontinued on discharge.  Patient need a basic metabolic profile, magnesium level done to follow-up on electrolytes and renal function.  Patient will need a CBC done to follow-up on H&H.  Pneumonia also needs to be followed up upon. Follow-up with Dr. [name] as scheduled on 05/16/2022.  Patient will need a basic metabolic profile, magnesium level, CBC done on follow-up.   Discharge Diagnoses:  Principal Problem:   CAP (community acquired pneumonia) Active Problems:   HLD (hyperlipidemia)   OSA (obstructive sleep apnea)   HTN (hypertension)   Hypokalemia   Gastroesophageal reflux disease   Pancreatic adenocarcinoma (HCC)   Hypomagnesemia   Sepsis (HCC)   Iron deficiency anemia due to chronic blood loss   Discharge Condition: Stable and improved.  Diet recommendation: Regular.  Filed Weights   05/10/22 0500 05/11/22 0500 05/12/22 0558  Weight: 78.9 kg 81 kg 80.9 kg    History of present illness:  HPI per Dr. Daiva Good is a 52 y.o. female with medical history significant of asthma, eczema, fibroids, hypertension, obesity, left polycystic ovary, sleep apnea not on CPAP, pancreatic carcinoma with last chemotherapy session on 04/18/2022 who was diagnosed on 05/02/2022 with community-acquired pneumonia given Omnicef, into the emergency department due to worsening cough and dyspnea since earlier in the week.  Of 4 days ago she was started on on Ancef, but has not noticed and the improvement.  She is coughing whitish or clear occasional red tinged sputum  associated with pleuritic chest pain, dyspnea and chills.  No fevers at home, but the patient hide of 100.8 F fever in triage.  She has been having right-sided headache and nasal congestion.  No rhinorrhea or sore throat. No sick contacts or travel history.  No palpitations, diaphoresis, PND, orthopnea or pitting edema of the lower extremities.  She has occasional nausea, appetite with good and bad days, no abdominal pain, diarrhea, constipation, melena or hematochezia.  No flank pain, dysuria, frequency or hematuria.  No polyuria, polydipsia, polyphagia or blurred vision.   ED course: Initial vital signs were temperature 100.8 F, pulse 140, respirations 24, BP 99/65 mmHg O2 sat 94% room air.  The patient received acetaminophen 650 mg p.o. x 1, cefepime 2 g IVP, azithromycin 500 mg IVPB, KCl 40 mEq p.o. x 1 and 1000 mL of LR bolus.  I added magnesium sulfate 2 g IVPB and another dose of KCl 40 mEq p.o. for later this morning.   Lab work: Her CBC is her white count 12.5, hemoglobin 11.7 g/dL platelets 184.  PT 15.6 and INR 1.3.  Lactic acid was normal.  Troponin 17 and then 18 ng/L.  Magnesium 1.5 and phosphorus 2.9 mg/dL.  CMP with a potassium 3.2 and CO2 of 20 mmol/L with a normal anion gap.  Renal function and the rest of the electrolytes are normal after calcium correction.  Glucose 115 mg/dL.  Total protein 6.0 and albumin 2.8 g/dL.  Alkaline phosphatase 219 units/L.  Normal transaminases and total bilirubin level.  Hospital Course:  1 CAP/left lower lobe -Patient admitted with left lower lobe pneumonia, failed outpatient therapy with antibiotics. On admission  noted to have a temperature of 100.8, tachycardic, with nasal congestion and right-sided headache. -Chest x-ray done consistent with left lower lobe pneumonia. -Blood cultures done with no growth to date.  SARS coronavirus 2 PCR negative, influenza A and B PCR negative, RSV by PCR negative. -Patient was placed on Mucinex, scheduled DuoNebs,  IV azithromycin, IV cefepime during the hospitalization.  Patient also received IV Lasix and IV albumin during the hospitalization, with good diuresis and clinical improvement.   -Patient subsequently transition to oral Levaquin. -Repeat chest x-ray done during the hospitalization with no significant change. -Patient will be discharged home on 2 more days of oral Levaquin, 5 days of Mucinex, Combivent inhaler as well.   -Outpatient follow-up with PCP and hematology/oncology.   2.  OSA -Not on CPAP.   3.  Hypomagnesemia/hypokalemia -Repleted during the hospitalization.   -HCTZ held during the hospitalization and will not be resumed on discharge.  -Patient also noted to have received some diuresis during the hospitalization and likely etiology in addition to HCTZ for patient's electrolyte abnormalities.   -Home regimen of oral daily potassium will be held until follow-up with hematology with repeat labs in 05/16/2022.     4.  Anemia of chronic disease -Hemoglobin remained stable at 8.5 by day of discharge.   -Patient received IV iron during the hospitalization per hematology.   -Outpatient follow-up with hematology/oncology.    5.  Hyperlipidemia -Not on therapy at this time. -Outpatient follow-up.   6.  Hypertension -BP stable.   -Patient's antihypertensive medications were held during the hospitalization and will be discontinued on discharge.   -Outpatient follow-up with PCP.     7.  GERD -Patient maintained on PPI as needed.    8.  Pancreatic adenocarcinoma -Patient seen by hematology/oncology, Dr. [name] who assessed the patient, - Per Oncology tumor markers have improved nicely and feel patient's cancer may be responding to treatment.  -Posttreatment of pneumonia, oncology to determine when to resume treatment. -Outpatient follow-up with hematology oncology as scheduled on 05/16/2022.      Procedures: Chest x-ray 05/06/2022, 05/08/2022, IV iron 05/11/2020     Consultations: Hematology/oncology: Dr. [name]  Discharge Exam: Vitals:   05/12/22 0858 05/12/22 1308  BP:  107/76  Pulse:  (!) 102  Resp:  18  Temp:  98.6 F (37 C)  SpO2: 91% 96%    General: NAD. Cardiovascular: Regular rate rhythm no murmurs rubs or gallops.  No JVD.  No lower extremity edema. Respiratory: Decreased left coarse breath sounds.  No wheezing.  Fair air movement.  Speaking in full sentences.  Discharge Instructions   Discharge Instructions     Diet general   Complete by: As directed    Increase activity slowly   Complete by: As directed       Allergies as of 05/12/2022   No Known Allergies      Medication List     STOP taking these medications    amLODipine 5 MG tablet Commonly known as: NORVASC   cefdinir 300 MG capsule Commonly known as: OMNICEF   hydrochlorothiazide 25 MG tablet Commonly known as: HYDRODIURIL   potassium chloride SA 20 MEQ tablet Commonly known as: KLOR-CON M       TAKE these medications    (feeding supplement) PROSource Plus liquid Take 30 mLs by mouth 3 (three) times daily between meals.   Ensure Max Protein Liqd Take 330 mLs (11 oz total) by mouth daily. Start taking on: May 13, 2022   acetaminophen 325  MG tablet Commonly known as: TYLENOL Take 2 tablets (650 mg total) by mouth every 6 (six) hours as needed for mild pain (or Fever >/= 101).   cyclobenzaprine 10 MG tablet Commonly known as: FLEXERIL TAKE 1 TABLET BY MOUTH AT BEDTIME AS NEEDED FOR MUSCLE SPASMS   dexamethasone 4 MG tablet Commonly known as: DECADRON Take 2 tablets (8 mg total) by mouth daily. Take 2 tablets daily x 3 days starting the day after chemotherapy. Take with food.   fluticasone 50 MCG/ACT nasal spray Commonly known as: FLONASE SPRAY 2 SPRAYS INTO EACH NOSTRIL EVERY DAY What changed: See the new instructions.   guaiFENesin 600 MG 12 hr tablet Commonly known as: MUCINEX Take 2 tablets (1,200 mg total) by mouth 2  (two) times daily for 5 days.   HYDROcodone bit-homatropine 5-1.5 MG/5ML syrup Commonly known as: HYCODAN Take 5 mLs by mouth every 4 (four) hours as needed for cough.   Ipratropium-Albuterol 20-100 MCG/ACT Aers respimat Commonly known as: COMBIVENT Inhale 1 puff into the lungs 3 (three) times daily for 5 days, THEN 1 puff every 6 (six) hours as needed for wheezing. Start taking on: May 12, 2022   levofloxacin 750 MG tablet Commonly known as: LEVAQUIN Take 1 tablet (750 mg total) by mouth daily for 2 days. Start taking on: May 13, 2022   lidocaine-prilocaine cream Commonly known as: EMLA Apply to affected area once   loperamide 2 MG capsule Commonly known as: IMODIUM Take 2 tabs by mouth with first loose stool, then 1 tab with each additional loose stool as needed. Do not exceed 8 tabs in a 24-hour period   omeprazole 40 MG capsule Commonly known as: PRILOSEC Take 1 capsule (40 mg total) by mouth daily. What changed:  when to take this reasons to take this   ondansetron 8 MG tablet Commonly known as: Zofran Take 1 tablet (8 mg total) by mouth every 8 (eight) hours as needed for nausea or vomiting. Start on the third day after cisplatin   Oxycodone HCl 10 MG Tabs Take 1 tablet (10 mg total) by mouth every 6 (six) hours as needed. What changed: reasons to take this   prochlorperazine 10 MG tablet Commonly known as: COMPAZINE Take 1 tablet (10 mg total) by mouth every 6 (six) hours as needed for nausea or vomiting (Nausea or vomiting).   temazepam 15 MG capsule Commonly known as: RESTORIL Take 1 capsule (15 mg total) by mouth at bedtime as needed for sleep.       No Known Allergies  Follow-up Information     [name], DO. Schedule an appointment as soon as possible for a visit in 2 week(s).   Specialty: Family Medicine Contact information: [address] [facility] [address] [place] [address] [phone]         Volanda Napoleon, MD  Follow up on 05/16/2022.   Specialty: Oncology Why: Follow-up as scheduled at 8 AM. Contact information: [place] [address] [phone]                  The results of significant diagnostics from this hospitalization (including imaging, microbiology, ancillary and laboratory) are listed below for reference.    Significant Diagnostic Studies: DG Chest 2 View  Result Date: 05/11/2022 CLINICAL DATA:  Followup PA and lateral chest X-ray is recommended in 3-4 weeks following trial of antibiotic therapy to ensure resolution and exclude underlying malignancy. EXAM: CHEST - 2  VIEW COMPARISON:  Chest x-ray May 08, 2022 FINDINGS: Stable positioning of the right chest wall port catheter, terminating in the low SVC. The cardiomediastinal silhouette is unchanged in contour. Unchanged confluent left basilar pulmonary opacity. Small left pleural effusion. No new focal pulmonary opacity. No large pneumothorax. The visualized upper abdomen is unremarkable. No acute osseous abnormality. IMPRESSION: Unchanged left basilar pneumonia and small left pleural effusion. Electronically Signed   By: Beryle Flock M.D.   On: 05/11/2022 10:06   DG CHEST PORT 1 VIEW  Result Date: 05/08/2022 CLINICAL DATA:  Shortness of breath EXAM: PORTABLE CHEST 1 VIEW COMPARISON:  Film from earlier in the same day. FINDINGS: Cardiac shadow is stable. Right chest wall port is again seen. Persistent left basilar infiltrate with associated effusion is noted. Right lung remains clear. No significant change is seen. IMPRESSION: Stable left lower lobe pneumonia. Electronically Signed   By: Inez Catalina M.D.   On: 05/08/2022 22:01   DG CHEST PORT 1 VIEW  Result Date: 05/08/2022 CLINICAL DATA:  Shortness of breath. EXAM: PORTABLE CHEST 1 VIEW COMPARISON:  05/06/2022 FINDINGS: There is a right chest wall port a catheter with tip in the SVC. Stable cardiomediastinal contours. Persistent airspace  consolidation involving nearly the entire left lower lobe. Right lung is clear. IMPRESSION: Persistent left lower lobe airspace consolidation compatible with pneumonia. Electronically Signed   By: Kerby Moors M.D.   On: 05/08/2022 06:49   DG Chest 2 View  Result Date: 05/06/2022 CLINICAL DATA:  52 year old female with suspected sepsis. Cough. Metastatic pancreatic cancer. EXAM: CHEST - 2 VIEW COMPARISON:  Chest radiographs 05/02/2022 and earlier. FINDINGS: Stable right chest Port-A-Cath. Continued somewhat low lung volumes. Increasing and now confluent left lung base opacity obscuring the diaphragm. Air bronchograms now. Streaky opacity there 4 days ago. No superimposed pneumothorax or pulmonary edema. Right lung appears stable and negative. Visible mediastinal contours remain within normal limits. Visualized tracheal air column is within normal limits. No acute osseous abnormality identified. Negative visible bowel gas. IMPRESSION: 1. Progressive lower left lung consolidation since 05/02/2022. This is nonspecific but consider Pneumonia. 2. Right lung within normal limits.  Stable right chest Port-A-Cath. Electronically Signed   By: Genevie Ann M.D.   On: 05/06/2022 04:25   DG Chest 2 View  Result Date: 05/02/2022 CLINICAL DATA:  Metastatic pancreatic cancer. Persistent productive cough. EXAM: CHEST - 2 VIEW COMPARISON:  Two-view chest x-ray 01/25/2022. CT of the chest 02/14/2022 FINDINGS: Heart size is normal. Right IJ Port-A-Cath is in place. The tip is in the mid SVC. The port is accessed. New left lower lobe airspace disease is present. No definite nodule or mass lesion is present. No significant hilar changes are present. The visualized soft tissues and bony thorax are unremarkable. IMPRESSION: 1. New left lower lobe airspace disease concerning for pneumonia. 2. Right IJ Port-A-Cath is in place. Electronically Signed   By: San Morelle M.D.   On: 05/02/2022 09:41    Microbiology: Recent  Results (from the past 240 hour(s))  Culture, blood (Routine x 2)     Status: None   Collection Time: 05/06/22  4:30 AM   Specimen: BLOOD  Result Value Ref Range Status   Specimen Description   Final    BLOOD RIGHT ANTECUBITAL Performed at [facility] [address] [place], [place] [address]    Special Requests   Final    BOTTLES DRAWN AEROBIC AND ANAEROBIC Blood Culture adequate volume Performed at [facility], [address] [place], [place] [address]    Culture   Final    NO GROWTH 5 DAYS Performed at [facility], [place] [address], [place], [place] [address]    Report Status 05/11/2022 FINAL  Final  Resp panel by RT-PCR (RSV, Flu A&B, Covid) Anterior Nasal Swab     Status: None   Collection Time: 05/06/22  6:48 AM   Specimen: Anterior Nasal Swab  Result Value Ref Range Status   SARS Coronavirus 2 by RT PCR NEGATIVE NEGATIVE Final    Comment: (NOTE) SARS-CoV-2 target nucleic acids are NOT DETECTED.  The SARS-CoV-2 RNA is generally detectable in upper respiratory specimens during the acute phase of infection. The lowest concentration of SARS-CoV-2 viral copies this assay can detect is 138 copies/mL. A negative result does not preclude SARS-Cov-2 infection and should not be used as the sole basis for treatment or other patient management decisions. A negative result may occur with  improper specimen collection/handling, submission of specimen other than nasopharyngeal swab, presence of viral mutation(s) within the areas targeted by this assay, and inadequate number of viral copies(<138 copies/mL). A negative result must be combined with clinical observations, patient history, and epidemiological information. The expected result is Negative.  Fact Sheet for Patients:  EntrepreneurPulse.com.au  Fact Sheet for Healthcare Providers:  IncredibleEmployment.be  This test is no t yet approved or  cleared by the [place] FDA and  has been authorized for detection and/or diagnosis of SARS-CoV-2 by FDA under an Emergency Use Authorization (EUA). This EUA will remain  in effect (meaning this test can be used) for the duration of the COVID-19 declaration under Section 564(b)(1) of the Act, 21 U.S.C.section 360bbb-3(b)(1), unless the authorization is terminated  or revoked sooner.       Influenza A by PCR NEGATIVE NEGATIVE Final   Influenza B by PCR NEGATIVE NEGATIVE Final    Comment: (NOTE) The Xpert Xpress SARS-CoV-2/FLU/RSV plus assay is intended as an aid in the diagnosis of influenza from Nasopharyngeal swab specimens and should not be used as a sole basis for treatment. Nasal washings and aspirates are unacceptable for Xpert Xpress SARS-CoV-2/FLU/RSV testing.  Fact Sheet for Patients: EntrepreneurPulse.com.au  Fact Sheet for Healthcare Providers: IncredibleEmployment.be  This test is not yet approved or cleared by the [place] FDA and has been authorized for detection and/or diagnosis of SARS-CoV-2 by FDA under an Emergency Use Authorization (EUA). This EUA will remain in effect (meaning this test can be used) for the duration of the COVID-19 declaration under Section 564(b)(1) of the Act, 21 U.S.C. section 360bbb-3(b)(1), unless the authorization is terminated or revoked.     Resp Syncytial Virus by PCR NEGATIVE NEGATIVE Final    Comment: (NOTE) Fact Sheet for Patients: EntrepreneurPulse.com.au  Fact Sheet for Healthcare Providers: IncredibleEmployment.be  This test is not yet approved or cleared by the [place] FDA and has been authorized for detection and/or diagnosis of SARS-CoV-2 by FDA under an Emergency Use Authorization (EUA). This EUA will remain in effect (meaning this test can be used) for the duration of the COVID-19 declaration under Section 564(b)(1) of the Act, 21  U.S.C. section 360bbb-3(b)(1), unless the authorization is terminated or revoked.  Performed at [facility], [place] [address], [place], [place] [address]   Culture, blood (Routine x 2)     Status: None   Collection Time: 05/07/22 12:05 PM   Specimen: BLOOD  Result Value Ref Range Status   Specimen Description   Final  BLOOD PORTA CATH Performed at [facility], [place] [address], [place], [place] [address]    Special Requests   Final    BOTTLES DRAWN AEROBIC AND ANAEROBIC Blood Culture adequate volume Performed at [facility] [address] [place], [place] [address]    Culture   Final    NO GROWTH 5 DAYS Performed at [facility], [place] [address], [place], [place] [address]    Report Status 05/12/2022 FINAL  Final     Labs: Basic Metabolic Panel: Recent Labs  Lab 05/07/22 1016 05/08/22 0336 05/09/22 0346 05/10/22 0333 05/11/22 0408 05/12/22 0500  NA 140 138 137 140 142 137  K 3.3* 2.9* 3.6 3.1* 3.3* 3.6  CL 107 105 105 106 105 102  CO2 '23 25 24 25 25 25  '$ GLUCOSE 82 84 85 90 79 66*  BUN <5* 5* <5* <5* 6 6  CREATININE 0.43* 0.36* 0.41* 0.45 0.39* 0.38*  CALCIUM 7.8* 7.7* 7.6* 8.1* 7.9* 8.2*  MG 2.0 2.0 2.0  --  1.8 2.3  PHOS 3.2 3.4 2.8  --  3.3 3.0   Liver Function Tests: Recent Labs  Lab 05/06/22 0430 05/07/22 1016 05/08/22 0336 05/09/22 0346 05/10/22 0333 05/11/22 0408 05/12/22 0500  AST '25 20 18 23 26  '$ --   --   ALT '26 20 17 19 19  '$ --   --   ALKPHOS 219* 217* 216* 179* 186*  --   --   BILITOT 0.7 0.5 0.5 0.6 0.7  --   --   PROT 6.0* 5.5* 5.4* 4.7* 5.2*  --   --   ALBUMIN 2.8* 2.3* 2.3* 1.9* 2.0* 2.4* 2.7*   No results for input(s): "LIPASE", "AMYLASE" in the last 168 hours. No results for input(s): "AMMONIA" in the last 168 hours. CBC: Recent Labs  Lab 05/06/22 0430 05/07/22 1016 05/08/22 0336 05/09/22 0346 05/10/22 0333 05/11/22 0408 05/12/22 0500  WBC 12.5* 12.3* 9.6 9.0 8.1  8.2 7.4  NEUTROABS 10.3* 10.1* 7.7 7.0  --   --   --   HGB 11.7* 9.4* 9.2* 8.7* 8.7* 8.2* 8.5*  HCT 36.2 29.8* 28.8* 26.5* 27.1* 26.1* 26.4*  MCV 97.3 99.0 97.6 97.1 96.8 97.4 97.8  PLT 184 167 180 175 187 189 204   Cardiac Enzymes: No results for input(s): "CKTOTAL", "CKMB", "CKMBINDEX", "TROPONINI" in the last 168 hours. BNP: BNP (last 3 results) No results for input(s): "BNP" in the last 8760 hours.  ProBNP (last 3 results) No results for input(s): "PROBNP" in the last 8760 hours.  CBG: No results for input(s): "GLUCAP" in the last 168 hours.     Signed:  Irine Seal MD.  [facility] 05/12/2022, 2:37 PM

## 2022-05-12 NOTE — Progress Notes (Signed)
Mobility Specialist - Progress Note   05/12/22 1127  Mobility  Activity Ambulated with assistance in hallway  Level of Assistance Modified independent, requires aide device or extra time  Assistive Device Other (Comment)  Distance Ambulated (ft) 350 ft  Activity Response Tolerated well  Mobility Referral Yes  $Mobility charge 1 Mobility   Pt received in recliner and agreeable to mobility. Pt held on to wall railing during ambulation due to weakness in legs. Pt stated she normally uses a rollator at home, but did not want to walk with a walker today despite suggesting it. No complaints during session. Pt to recliner after session with all needs met.   [facility]

## 2022-05-12 NOTE — Progress Notes (Signed)
Makayla Good looks quite good this morning.  She had a chest x-ray yesterday which showed the left lower lobe infiltrate.  These do not seem to be any worse on the chest x-ray.  She is now on Levaquin for this pneumonia.  She does not require any oxygen.  We did give her some iron yesterday.  Her CBC today shows white count 7.4.  Hemoglobin 8.5.  Platelet count 204,000.  MCV is 98.  She still has little bit of a decreased appetite.  There is no nausea or vomiting.  She has had no cough.  She has had no bleeding.  There is been no fever.  She is out of bed.  She does not have any diarrhea.  Her vital signs are temperature of 98.5.  Pulse 95.  Blood pressure 102/67.  Her lungs sound clear bilaterally.  She has good air movement bilaterally.  Cardiac exam regular rate and rhythm.  Abdomen is soft.  Bowel sounds are present.  There is no guarding or rebound tenderness.  Extremity shows no clubbing, cyanosis or edema.  Neurological exam shows no focal neurological deficits.  Makayla Good has a pancreatic cancer.  This really is not the problem right now.  Her pneumonia is why she was admitted.  She has a left lower lobe infiltrate.  She is on Levaquin now.  She looks good.  She is off oxygen.  Hopefully, she will be able to go home this weekend.  If she does go home this weekend, we can follow-up with her in the office and hopefully get her back on treatment for the pancreatic cancer.  I do appreciate all the great care she has received from the wonderful staff on [address].  Lattie Haw, MD  Colossians 3:23

## 2022-05-12 NOTE — Plan of Care (Signed)

## 2022-05-12 NOTE — Progress Notes (Signed)
Pt discharged to home. Prior to DC, Chest port DC, Pt was given DC paperwork regarding medications, condition, and follow up appointments. Pt verbalized understanding and stated no other concerns . Pt verbalized understanding and state no other concerns at the time. Pt stable at time of dc and left in personal vehicle driven by her sister.

## 2022-05-13 ENCOUNTER — Other Ambulatory Visit: Payer: Self-pay

## 2022-05-14 ENCOUNTER — Encounter: Payer: Self-pay | Admitting: *Deleted

## 2022-05-14 ENCOUNTER — Telehealth: Payer: Self-pay | Admitting: *Deleted

## 2022-05-14 NOTE — Patient Outreach (Signed)
  Care Coordination [facility] Note Transition Care Management Follow-up Telephone Call Date of discharge and from where: Saturday, 05/12/22; [facility]; CAP/ sepsis, in setting of pancreatic CA How have you been since you were released from the hospital? "Overall, things are going well.  I am still a bit weak, but that is to be expected after everything I have been through recently.  My family is looking our for me real close and they can help me with just about anything I need, they live close, and my husband is here also.  I have finished the antibiotic, took it like I was told to and in general, I am feeling good, I just need some more time to regain my strength" Any questions or concerns? No  Items Reviewed: Did the pt receive and understand the discharge instructions provided? Yes - reviewed printed hospital discharge instructions with patient thoroughly Medications obtained and verified? Yes - full medication review completed; no discrepancies or concerns identified; patient confirms she self-manages medications and she denies questions/ concerns during TOC call today Other? No  Any new allergies since your discharge? No  Dietary orders reviewed? Yes Do you have support at home? Yes  patient reports she is essentially independent in self-care activities; reports family members assist as needed/ indicated  Home Care and Equipment/Supplies: Were home health services ordered? no If so, what is the name of the agency? N/A  Has the agency set up a time to come to the patient's home? not applicable Were any new equipment or medical supplies ordered?  No What is the name of the medical supply agency? N/A Were you able to get the supplies/equipment? not applicable Do you have any questions related to the use of the equipment or supplies? No  Functional Questionnaire: (I = Independent and D = Dependent) ADLs: I  Bathing/Dressing- I  Meal Prep- I  Eating- I  Maintaining continence-  I  Transferring/Ambulation- I  Managing Meds- I  Follow up appointments reviewed:  PCP Hospital f/u appt confirmed? No  Scheduled to see - on - @ - request sent to scheduling care guide to facilitate scheduling this appointment [facility] f/u appt confirmed? Yes  Scheduled to see oncology provider on Wednesday 05/16/22 @ 08:30 am Are transportation arrangements needed? No  If their condition worsens, is the pt aware to call PCP or go to the Emergency Dept.? Yes Was the patient provided with contact information for the PCP's office or ED? No- patient declines/ reports already has contact information for all care providers Was to pt encouraged to call back with questions or concerns? Yes provided my direct phone number should questions/ concerns arise in the future; today, patient declines offer for ongoing care coordination outreach  SDOH assessments and interventions completed:   Yes SDOH Interventions Today    Flowsheet Row Most Recent Value  SDOH Interventions   Food Insecurity Interventions Intervention Not Indicated  Transportation Interventions Intervention Not Indicated  [family provides transportation]      Care Coordination Interventions:  PCP follow up appointment requested Full medication review completed; provided education around nature of/ process to initiate home health services, if indicated; provided education around process to obtain nutritional supplement as recommended post-hospital discharge    Encounter Outcome:  Pt. Visit Completed    Oneta Rack, RN, BSN, CCRN Alumnus RN CM Care Coordination/ Transition of [place] Management [phone]: direct office

## 2022-05-14 NOTE — Progress Notes (Signed)
  Care Coordination  Note  05/14/2022 Name: Vance Belcourt MRN: [id number] DOB: [birth year]  Sena Slate Roselina Burgueno is a 52 y.o. year old primary care patient of [name], DO. I reached out to [facility] [employer] by phone today to assist with scheduling a follow up appointment. [name] verbally consented to my assistance.       Follow up plan: Hospital Follow Up appointment scheduled with (Dr Etter Sjogren) on (05/17/2022) at (140pm).  Julian Hy, [place] Direct Dial: [phone]

## 2022-05-16 ENCOUNTER — Encounter: Payer: Self-pay | Admitting: *Deleted

## 2022-05-16 ENCOUNTER — Encounter: Payer: Self-pay | Admitting: Hematology & Oncology

## 2022-05-16 ENCOUNTER — Inpatient Hospital Stay: Payer: BC Managed Care – PPO

## 2022-05-16 ENCOUNTER — Other Ambulatory Visit: Payer: Self-pay

## 2022-05-16 ENCOUNTER — Inpatient Hospital Stay (HOSPITAL_BASED_OUTPATIENT_CLINIC_OR_DEPARTMENT_OTHER): Payer: BC Managed Care – PPO | Admitting: Hematology & Oncology

## 2022-05-16 VITALS — BP 116/72 | HR 108 | Temp 98.2°F | Resp 20 | Ht 65.0 in | Wt 168.0 lb

## 2022-05-16 DIAGNOSIS — C259 Malignant neoplasm of pancreas, unspecified: Secondary | ICD-10-CM

## 2022-05-16 DIAGNOSIS — C787 Secondary malignant neoplasm of liver and intrahepatic bile duct: Secondary | ICD-10-CM | POA: Diagnosis not present

## 2022-05-16 DIAGNOSIS — Z95828 Presence of other vascular implants and grafts: Secondary | ICD-10-CM

## 2022-05-16 DIAGNOSIS — J189 Pneumonia, unspecified organism: Secondary | ICD-10-CM

## 2022-05-16 DIAGNOSIS — Z79899 Other long term (current) drug therapy: Secondary | ICD-10-CM | POA: Diagnosis not present

## 2022-05-16 DIAGNOSIS — E876 Hypokalemia: Secondary | ICD-10-CM

## 2022-05-16 DIAGNOSIS — Z5111 Encounter for antineoplastic chemotherapy: Secondary | ICD-10-CM | POA: Diagnosis not present

## 2022-05-16 DIAGNOSIS — C7951 Secondary malignant neoplasm of bone: Secondary | ICD-10-CM

## 2022-05-16 LAB — CMP (CANCER CENTER ONLY)
ALT: 17 U/L (ref 0–44)
AST: 25 U/L (ref 15–41)
Albumin: 3.3 g/dL — ABNORMAL LOW (ref 3.5–5.0)
Alkaline Phosphatase: 205 U/L — ABNORMAL HIGH (ref 38–126)
Anion gap: 12 (ref 5–15)
BUN: <5 mg/dL — ABNORMAL LOW (ref 6–20)
CO2: 25 mmol/L (ref 22–32)
Calcium: 9.1 mg/dL (ref 8.9–10.3)
Chloride: 102 mmol/L (ref 98–111)
Creatinine: 0.38 mg/dL — ABNORMAL LOW (ref 0.44–1.00)
GFR, Estimated: >60 mL/min (ref >60)
Glucose, Bld: 92 mg/dL (ref 70–99)
Potassium: 3.4 mmol/L — ABNORMAL LOW (ref 3.5–5.1)
Sodium: 139 mmol/L (ref 135–145)
Total Bilirubin: 0.6 mg/dL (ref 0.3–1.2)
Total Protein: 6.6 g/dL (ref 6.5–8.1)

## 2022-05-16 LAB — PREALBUMIN: Prealbumin: 6 mg/dL — ABNORMAL LOW (ref 18–38)

## 2022-05-16 LAB — CBC WITH DIFFERENTIAL (CANCER CENTER ONLY)
Abs Immature Granulocytes: 0.09 10*3/uL — ABNORMAL HIGH (ref 0.00–0.07)
Basophils Absolute: 0.1 10*3/uL (ref 0.0–0.1)
Basophils Relative: 1 %
Eosinophils Absolute: 0 10*3/uL (ref 0.0–0.5)
Eosinophils Relative: 0 %
HCT: 30.3 % — ABNORMAL LOW (ref 36.0–46.0)
Hemoglobin: 9.7 g/dL — ABNORMAL LOW (ref 12.0–15.0)
Immature Granulocytes: 1 %
Lymphocytes Relative: 8 %
Lymphs Abs: 0.6 10*3/uL — ABNORMAL LOW (ref 0.7–4.0)
MCH: 31.2 pg (ref 26.0–34.0)
MCHC: 32 g/dL (ref 30.0–36.0)
MCV: 97.4 fL (ref 80.0–100.0)
Monocytes Absolute: 1 10*3/uL (ref 0.1–1.0)
Monocytes Relative: 11 %
Neutro Abs: 6.7 10*3/uL (ref 1.7–7.7)
Neutrophils Relative %: 79 %
Platelet Count: 300 10*3/uL (ref 150–400)
RBC: 3.11 MIL/uL — ABNORMAL LOW (ref 3.87–5.11)
RDW: 17.4 % — ABNORMAL HIGH (ref 11.5–15.5)
WBC Count: 8.4 10*3/uL (ref 4.0–10.5)
nRBC: 0 % (ref 0.0–0.2)

## 2022-05-16 LAB — SAMPLE TO BLOOD BANK

## 2022-05-16 LAB — LACTATE DEHYDROGENASE: LDH: 444 U/L — ABNORMAL HIGH (ref 98–192)

## 2022-05-16 MED ORDER — HEPARIN SOD (PORK) LOCK FLUSH 100 UNIT/ML IV SOLN
500.0000 [IU] | Freq: Once | INTRAVENOUS | Status: AC
  Administered 2022-05-16: 500 [IU] via INTRAVENOUS

## 2022-05-16 MED ORDER — SODIUM CHLORIDE 0.9% FLUSH
10.0000 mL | Freq: Once | INTRAVENOUS | Status: AC
  Administered 2022-05-16: 10 mL via INTRAVENOUS

## 2022-05-16 MED ORDER — OMEPRAZOLE 40 MG PO CPDR: 40.0000 mg | DELAYED_RELEASE_CAPSULE | Freq: Every day | ORAL | 3 refills | Status: DC

## 2022-05-16 NOTE — Progress Notes (Signed)
Patient just discharged from the hospital for pneumonia. Will hold treatment for one week.   Oncology Nurse Navigator Documentation     05/16/2022   10:00 AM  Oncology Nurse Navigator Flowsheets  Navigator Follow Up Date: 05/23/2022  Navigator Follow Up Reason: Follow-up Appointment;Chemotherapy  Navigator Location [facility]  Navigator Encounter Type Appt/Treatment Plan Review  Patient Visit Type MedOnc  Treatment Phase Active Tx  Barriers/Navigation Needs Coordination of Care;Education  Interventions None Required  Acuity Level 2-Minimal Needs (1-2 Barriers Identified)  Support Groups/Services Friends and Family  Time Spent with Patient 15

## 2022-05-16 NOTE — Addendum Note (Signed)
Addended by: Lucile Crater on: 05/16/2022 09:28 AM   Modules accepted: Orders

## 2022-05-16 NOTE — Progress Notes (Signed)
Hematology and Oncology Follow Up Visit  Makayla Good [id number] 17-Feb-1971 52 y.o. 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  Unfortunately, she was just discharged from the hospital over the weekend.  She had left lower lobe pneumonia.  She was on IV antibiotics.  She improved.  She is still not ready for treatment.  I think we need to give her a week off.  She has responded well to treatment.  Her last CA 19-9 was down to 159.  She does feel better.  She still has little bit of shortness of breath.  She was quite anemic in the hospital.  We did give her some IV iron which helped.  She has had no nausea or vomiting.  She needs some Prilosec.  She is not having any diarrhea.  There is been no fever.  She has had no bleeding.  She has had little bit of leg swelling.  There is been no rashes.  She has had no mouth sores.  There is been no headache.  Overall, I would say that her performance status is probably ECOG 1.    Medications:  Allergies as of 05/16/2022   No Known Allergies      Medication List        Accurate as of May 16, 2022  9:02 AM. If you have any questions, ask your nurse or doctor.          (feeding supplement) PROSource Plus liquid Take 30 mLs by mouth 3 (three) times daily between meals.   Ensure Max Protein Liqd Take 330 mLs (11 oz total) by mouth daily.   acetaminophen 325 MG tablet Commonly known as: TYLENOL Take 2 tablets (650 mg total) by mouth every 6 (six) hours as needed for mild pain (or Fever >/= 101).   cyclobenzaprine 10 MG tablet Commonly known as: FLEXERIL TAKE 1 TABLET BY MOUTH AT BEDTIME AS NEEDED FOR MUSCLE SPASMS   dexamethasone 4 MG tablet Commonly known  as: DECADRON Take 2 tablets (8 mg total) by mouth daily. Take 2 tablets daily x 3 days starting the day after chemotherapy. Take with food.   fluticasone 50 MCG/ACT nasal spray Commonly known as: FLONASE SPRAY 2 SPRAYS INTO EACH NOSTRIL EVERY DAY What changed: See the new instructions.   guaiFENesin 600 MG 12 hr tablet Commonly known as: MUCINEX Take 2 tablets (1,200 mg total) by mouth 2 (two) times daily for 5 days.   HYDROcodone bit-homatropine 5-1.5 MG/5ML syrup Commonly known as: HYCODAN Take 5 mLs by mouth every 4 (four) hours as needed for cough.   Ipratropium-Albuterol 20-100 MCG/ACT Aers respimat Commonly known as: COMBIVENT Inhale 1 puff into the lungs 3 (three) times daily for 5 days, THEN 1 puff every 6 (six) hours as needed for wheezing. Start taking on: May 12, 2022   lidocaine-prilocaine cream Commonly known as: EMLA Apply to affected area once   loperamide 2 MG capsule Commonly known as: IMODIUM Take 2 tabs by mouth with first loose stool, then 1 tab with each additional loose stool as needed. Do not exceed 8 tabs in a 24-hour period   omeprazole 40 MG capsule Commonly known as: PRILOSEC Take 1 capsule (40 mg total) by mouth daily.  What changed:  when to take this reasons to take this   ondansetron 8 MG tablet Commonly known as: Zofran Take 1 tablet (8 mg total) by mouth every 8 (eight) hours as needed for nausea or vomiting. Start on the third day after cisplatin   Oxycodone HCl 10 MG Tabs Take 1 tablet (10 mg total) by mouth every 6 (six) hours as needed. What changed: reasons to take this   prochlorperazine 10 MG tablet Commonly known as: COMPAZINE Take 1 tablet (10 mg total) by mouth every 6 (six) hours as needed for nausea or vomiting (Nausea or vomiting).   temazepam 15 MG capsule Commonly known as: RESTORIL Take 1 capsule (15 mg total) by mouth at bedtime as needed for sleep.        Allergies: No Known Allergies  Past Medical  History, Surgical history, Social history, and Family History were reviewed and updated.  Review of Systems: Review of Systems  Constitutional:  Positive for malaise/fatigue.  HENT: Negative.    Eyes: Negative.   Respiratory:  Positive for shortness of breath.   Cardiovascular: Negative.   Gastrointestinal:  Positive for heartburn.  Genitourinary: Negative.   Musculoskeletal:  Positive for back pain.  Skin: Negative.   Neurological: Negative.   Endo/Heme/Allergies: Negative.   Psychiatric/Behavioral: Negative.       Physical Exam:  height is '5\' 5"'$  (1.651 m) and weight is 168 lb (76.2 kg). Her oral temperature is 98.2 F (36.8 C). Her blood pressure is 116/72 and her pulse is 108 (abnormal). Her respiration is 20 and oxygen saturation is 96%.   Wt Readings from Last 3 Encounters:  05/16/22 168 lb (76.2 kg)  05/12/22 178 lb 5.6 oz (80.9 kg)  05/02/22 168 lb 6.4 oz (76.4 kg)    Physical Exam Vitals reviewed.  HENT:     Head: Normocephalic and atraumatic.  Eyes:     Pupils: Pupils are equal, round, and reactive to light.  Cardiovascular:     Rate and Rhythm: Normal rate and regular rhythm.     Heart sounds: Normal heart sounds.  Pulmonary:     Effort: Pulmonary effort is normal.     Breath sounds: Normal breath sounds.  Abdominal:     General: Bowel sounds are normal.     Palpations: Abdomen is soft.  Musculoskeletal:        General: No tenderness or deformity. Normal range of motion.     Cervical back: Normal range of motion.  Lymphadenopathy:     Cervical: No cervical adenopathy.  Skin:    General: Skin is warm and dry.     Findings: No erythema or rash.  Neurological:     Mental Status: She is alert and oriented to person, place, and time.  Psychiatric:        Behavior: Behavior normal.        Thought Content: Thought content normal.        Judgment: Judgment normal.     Lab Results  Component Value Date   WBC 8.4 05/16/2022   HGB 9.7 (L) 05/16/2022    HCT 30.3 (L) 05/16/2022   MCV 97.4 05/16/2022   PLT 300 05/16/2022   Lab Results  Component Value Date   FERRITIN 775 (H) 05/09/2022   IRON 19 (L) 05/09/2022   TIBC 134 (L) 05/09/2022   UIBC 115 05/09/2022   IRONPCTSAT 14 05/09/2022   Lab Results  Component Value Date   RETICCTPCT 1.9 05/09/2022   RBC 3.11 (L) 05/16/2022  No results found for: "KPAFRELGTCHN", "LAMBDASER", "KAPLAMBRATIO" No results found for: "IGGSERUM", "IGA", "IGMSERUM" No results found for: "TOTALPROTELP", "ALBUMINELP", "A1GS", "A2GS", "BETS", "BETA2SER", "GAMS", "MSPIKE", "SPEI"   Chemistry      Component Value Date/Time   NA 137 05/12/2022 0500   K 3.6 05/12/2022 0500   CL 102 05/12/2022 0500   CO2 25 05/12/2022 0500   BUN 6 05/12/2022 0500   CREATININE 0.38 (L) 05/12/2022 0500   CREATININE 0.54 05/02/2022 0828   CREATININE 0.79 12/08/2021 1420      Component Value Date/Time   CALCIUM 8.2 (L) 05/12/2022 0500   ALKPHOS 186 (H) 05/10/2022 0333   AST 26 05/10/2022 0333   AST 44 (H) 05/02/2022 0828   ALT 19 05/10/2022 0333   ALT 36 05/02/2022 0828   BILITOT 0.7 05/10/2022 0333   BILITOT 0.3 05/02/2022 0828       Impression and Plan: Makayla Good is a very pleasant 52 yo African American female with metastatic pancreatic adenocarcinoma.  She has had 2 cycles of chemotherapy with FOLFIRINOX.  She has responded by the decrease in her CA 19-9.  She still needs to recover from this pneumonia.  As such, we will give her another week off.  I think this will certainly help her out.  We will plan to have her come back next week and we will do her third cycle of treatment.   Volanda Napoleon, MD 1/24/20249:02 AM

## 2022-05-16 NOTE — Patient Instructions (Signed)

## 2022-05-17 ENCOUNTER — Ambulatory Visit: Payer: BC Managed Care – PPO | Admitting: Family Medicine

## 2022-05-17 ENCOUNTER — Encounter: Payer: Self-pay | Admitting: Family Medicine

## 2022-05-17 VITALS — BP 120/84 | HR 115 | Temp 97.9°F | Resp 18 | Ht 65.0 in | Wt 168.4 lb

## 2022-05-17 DIAGNOSIS — I10 Essential (primary) hypertension: Secondary | ICD-10-CM

## 2022-05-17 DIAGNOSIS — J189 Pneumonia, unspecified organism: Secondary | ICD-10-CM

## 2022-05-17 NOTE — Assessment & Plan Note (Signed)
Pt doing better  Recent Results (from the past 2160 hour(s))  Cancer antigen 19-9     Status: Abnormal   Collection Time: 02/19/22 10:39 AM  Result Value Ref Range   CA 19-9 113 (H) 0 - 35 U/mL    Comment: (NOTE) Roche Diagnostics Electrochemiluminescence Immunoassay (ECLIA) Values obtained with different assay methods or kits cannot be used interchangeably.  Results cannot be interpreted as absolute evidence of the presence or absence of malignant disease. Performed At: [facility] [place], [place] [id number] Rush Farmer MD IO:[id number]   CBC with Differential ([place] Only)     Status: Abnormal   Collection Time: 02/19/22 10:39 AM  Result Value Ref Range   WBC Count 5.1 4.0 - 10.5 K/uL   RBC 3.76 (L) 3.87 - 5.11 MIL/uL   Hemoglobin 11.9 (L) 12.0 - 15.0 g/dL   HCT 35.3 (L) 36.0 - 46.0 %   MCV 93.9 80.0 - 100.0 fL   MCH 31.6 26.0 - 34.0 pg   MCHC 33.7 30.0 - 36.0 g/dL   RDW 12.2 11.5 - 15.5 %   Platelet Count 257 150 - 400 K/uL   nRBC 0.0 0.0 - 0.2 %   Neutrophils Relative % 52 %   Neutro Abs 2.7 1.7 - 7.7 K/uL   Lymphocytes Relative 35 %   Lymphs Abs 1.8 0.7 - 4.0 K/uL   Monocytes Relative 7 %   Monocytes Absolute 0.4 0.1 - 1.0 K/uL   Eosinophils Relative 4 %   Eosinophils Absolute 0.2 0.0 - 0.5 K/uL   Basophils Relative 1 %   Basophils Absolute 0.0 0.0 - 0.1 K/uL   Immature Granulocytes 1 %   Abs Immature Granulocytes 0.05 0.00 - 0.07 K/uL    Comment: Performed at [facility], [address] [place], [place] [address]  CMP ([place] only)     Status: Abnormal   Collection Time: 02/19/22 10:39 AM  Result Value Ref Range   Sodium 141 135 - 145 mmol/L   Potassium 3.4 (L) 3.5 - 5.1 mmol/L   Chloride 103 98 - 111 mmol/L   CO2 28 22 - 32 mmol/L   Glucose, Bld 102 (H) 70 - 99 mg/dL    Comment: Glucose reference range applies only to samples taken after fasting for at least 8 hours.   BUN 9  6 - 20 mg/dL   Creatinine 0.65 0.44 - 1.00 mg/dL   Calcium 10.0 8.9 - 10.3 mg/dL   Total Protein 7.5 6.5 - 8.1 g/dL   Albumin 4.3 3.5 - 5.0 g/dL   AST 20 15 - 41 U/L   ALT 14 0 - 44 U/L   Alkaline Phosphatase 96 38 - 126 U/L   Total Bilirubin 0.7 0.3 - 1.2 mg/dL   GFR, Estimated >60 >60 mL/min    Comment: (NOTE) Calculated using the CKD-EPI Creatinine Equation (2021)    Anion gap 10 5 - 15    Comment: Performed at [facility], [place] [address], [place], [place] [address]  Lactate dehydrogenase     Status: Abnormal   Collection Time: 02/19/22 10:40 AM  Result Value Ref Range   LDH 238 (H) 98 - 192 U/L    Comment: Performed at [facility], [place] [address] [place], [place] [address]  Prealbumin     Status: Abnormal   Collection Time: 02/19/22 10:40 AM  Result Value Ref Range   Prealbumin  15 (L) 18 - 38 mg/dL    Comment: Performed at [facility], [place] [address], [place], [place] [address]  CBC upon arrival     Status: Abnormal   Collection Time: 02/20/22 11:28 AM  Result Value Ref Range   WBC 5.0 4.0 - 10.5 K/uL   RBC 3.69 (L) 3.87 - 5.11 MIL/uL   Hemoglobin 12.0 12.0 - 15.0 g/dL   HCT 34.4 (L) 36.0 - 46.0 %   MCV 93.2 80.0 - 100.0 fL   MCH 32.5 26.0 - 34.0 pg   MCHC 34.9 30.0 - 36.0 g/dL   RDW 12.4 11.5 - 15.5 %   Platelets 245 150 - 400 K/uL   nRBC 0.0 0.0 - 0.2 %    Comment: Performed at [facility], [place] [address], [place], [place] [address]  Protime-INR upon arrival     Status: None   Collection Time: 02/20/22 11:28 AM  Result Value Ref Range   Prothrombin Time 13.3 11.4 - 15.2 seconds   INR 1.0 0.8 - 1.2    Comment: (NOTE) INR goal varies based on device and disease states. Performed at [facility], [place] [address], [place], [place] [address]   Surgical pathology     Status: None   Collection Time: 02/20/22  2:03 PM  Result Value Ref Range   SURGICAL PATHOLOGY      SURGICAL PATHOLOGY CASE:  [id number] PATIENT: Makayla Good Surgical Pathology Report     Clinical History: pancreatic mass, now with indeterminate lesions within liver worrisome for mets (cm)     FINAL MICROSCOPIC DIAGNOSIS:  A. LIVER LESION, NEEDLE CORE BIOPSY: Metastatic adenocarcinoma. Nonneoplastic portion of the liver is normal. Please see comment.  Comment: The following immunostains are performed with appropriate controls: CDX2: Positive. CK7: Positive in isolated cells. CK20: Negative. CK5/6: Negative. P63: Negative. TTF-1: Negative. GATA3: Negative.  The patient's clinical history is reviewed.  In view of the mass in the pancreas and results immunostains this most likely represents metastatic adenocarcinoma from a primary in the pancreas.  Dr. Saralyn Pilar has also reviewed this case and concurs with the above interpretation.   GROSS DESCRIPTION:  Received in formalin are 4 cores of gray-white to dark brown soft tissue which range fr om 2 x 0.1 cm to 2.4 x 0.1 cm, and are divided among 2 blocks for routine histology.  SW 02/20/2022   Final Diagnosis performed by Unknown Jim, MD.   Electronically signed 02/22/2022 Technical and / or Professional components performed at [facility]. [facility], [place] [address] [place], [place] [address].  Immunohistochemistry Technical component (if applicable) was performed at [facility]. [address] [place], [place], [place] [address].   IMMUNOHISTOCHEMISTRY DISCLAIMER (if applicable): Some of these immunohistochemical stains may have been developed and the performance characteristics determine by [facility]. Some may not have been cleared or approved by the U.S. Food and Drug Administration. The FDA has determined that such clearance or approval is not necessary. This test is used for clinical purposes. It should not be regarded as investigational or for research. This laboratory is certified  under the [place] ([place]) as qualified to perform high complexity clinical laboratory testing.  The controls stained appropriately.   CBC with Differential ([facility] Only)     Status: Abnormal   Collection Time: 02/28/22  1:43 PM  Result Value Ref Range   WBC Count 6.4 4.0 - 10.5 K/uL  RBC 3.77 (L) 3.87 - 5.11 MIL/uL   Hemoglobin 11.8 (L) 12.0 - 15.0 g/dL   HCT 35.7 (L) 36.0 - 46.0 %   MCV 94.7 80.0 - 100.0 fL   MCH 31.3 26.0 - 34.0 pg   MCHC 33.1 30.0 - 36.0 g/dL   RDW 12.4 11.5 - 15.5 %   Platelet Count 272 150 - 400 K/uL   nRBC 0.0 0.0 - 0.2 %   Neutrophils Relative % 56 %   Neutro Abs 3.6 1.7 - 7.7 K/uL   Lymphocytes Relative 33 %   Lymphs Abs 2.1 0.7 - 4.0 K/uL   Monocytes Relative 8 %   Monocytes Absolute 0.5 0.1 - 1.0 K/uL   Eosinophils Relative 2 %   Eosinophils Absolute 0.2 0.0 - 0.5 K/uL   Basophils Relative 1 %   Basophils Absolute 0.1 0.0 - 0.1 K/uL   Immature Granulocytes 0 %   Abs Immature Granulocytes 0.02 0.00 - 0.07 K/uL    Comment: Performed at [facility] at [facility], [address], [place], [place] [address]  CMP ([place] only)     Status: Abnormal   Collection Time: 02/28/22  1:43 PM  Result Value Ref Range   Sodium 140 135 - 145 mmol/L   Potassium 4.0 3.5 - 5.1 mmol/L   Chloride 102 98 - 111 mmol/L   CO2 28 22 - 32 mmol/L   Glucose, Bld 127 (H) 70 - 99 mg/dL    Comment: Glucose reference range applies only to samples taken after fasting for at least 8 hours.   BUN 12 6 - 20 mg/dL   Creatinine 0.73 0.44 - 1.00 mg/dL   Calcium 10.3 8.9 - 10.3 mg/dL   Total Protein 8.2 (H) 6.5 - 8.1 g/dL   Albumin 4.4 3.5 - 5.0 g/dL   AST 31 15 - 41 U/L   ALT 27 0 - 44 U/L   Alkaline Phosphatase 125 38 - 126 U/L   Total Bilirubin 0.6 0.3 - 1.2 mg/dL   GFR, Estimated >60 >60 mL/min    Comment: (NOTE) Calculated using the CKD-EPI Creatinine Equation (2021)    Anion gap 10 5 -  15    Comment: Performed at [facility], [place] [address] [place], [place] [address]  Genetic Screening Order     Status: None   Collection Time: 02/28/22  1:43 PM  Result Value Ref Range   Genetic Screening Order Collected by Laboratory     Comment: Performed at [facility] at [facility], [address] [place], [place] [address]  Urinalysis, Routine w reflex microscopic Urine, Clean Catch     Status: Abnormal   Collection Time: 03/05/22  5:24 AM  Result Value Ref Range   Color, Urine YELLOW YELLOW   APPearance CLEAR CLEAR   Specific Gravity, Urine 1.016 1.005 - 1.030   pH 6.0 5.0 - 8.0   Glucose, UA NEGATIVE NEGATIVE mg/dL   Hgb urine dipstick NEGATIVE NEGATIVE   Bilirubin Urine NEGATIVE NEGATIVE   Ketones, ur NEGATIVE NEGATIVE mg/dL   Protein, ur NEGATIVE NEGATIVE mg/dL   Nitrite NEGATIVE NEGATIVE   Leukocytes,Ua TRACE (A) NEGATIVE   RBC / HPF 0-5 0 - 5 RBC/hpf   WBC, UA 0-5 0 - 5 WBC/hpf   Bacteria, UA RARE (A) NONE SEEN   Squamous Epithelial / HPF 6-10 0 - 5   Mucus PRESENT     Comment: Performed at  [facility], [place] [address], [place], [place] [address]  Comprehensive metabolic panel     Status: Abnormal   Collection Time: 03/05/22  5:50 AM  Result Value Ref Range   Sodium 136 135 - 145 mmol/L   Potassium 3.5 3.5 - 5.1 mmol/L   Chloride 99 98 - 111 mmol/L   CO2 26 22 - 32 mmol/L   Glucose, Bld 124 (H) 70 - 99 mg/dL    Comment: Glucose reference range applies only to samples taken after fasting for at least 8 hours.   BUN 12 6 - 20 mg/dL   Creatinine, Ser 0.54 0.44 - 1.00 mg/dL   Calcium 10.0 8.9 - 10.3 mg/dL   Total Protein 8.7 (H) 6.5 - 8.1 g/dL   Albumin 4.0 3.5 - 5.0 g/dL   AST 30 15 - 41 U/L   ALT 29 0 - 44 U/L   Alkaline Phosphatase 125 38 - 126 U/L   Total Bilirubin 0.9 0.3 - 1.2 mg/dL   GFR, Estimated >60 >60 mL/min    Comment: (NOTE) Calculated using the CKD-EPI Creatinine Equation  (2021)    Anion gap 11 5 - 15    Comment: Performed at [facility], [place] [address] [place], [place] [address]  CBC with Differential     Status: None   Collection Time: 03/05/22  5:50 AM  Result Value Ref Range   WBC 7.2 4.0 - 10.5 K/uL   RBC 3.95 3.87 - 5.11 MIL/uL   Hemoglobin 12.2 12.0 - 15.0 g/dL   HCT 36.7 36.0 - 46.0 %   MCV 92.9 80.0 - 100.0 fL   MCH 30.9 26.0 - 34.0 pg   MCHC 33.2 30.0 - 36.0 g/dL   RDW 12.4 11.5 - 15.5 %   Platelets 322 150 - 400 K/uL   nRBC 0.0 0.0 - 0.2 %   Neutrophils Relative % 56 %   Neutro Abs 4.1 1.7 - 7.7 K/uL   Lymphocytes Relative 31 %   Lymphs Abs 2.3 0.7 - 4.0 K/uL   Monocytes Relative 10 %   Monocytes Absolute 0.7 0.1 - 1.0 K/uL   Eosinophils Relative 2 %   Eosinophils Absolute 0.1 0.0 - 0.5 K/uL   Basophils Relative 1 %   Basophils Absolute 0.1 0.0 - 0.1 K/uL   Immature Granulocytes 0 %   Abs Immature Granulocytes 0.01 0.00 - 0.07 K/uL    Comment: Performed at [facility], [place] [address], [place], [place] [address]  HIV Antibody (routine testing w rflx)     Status: None   Collection Time: 03/05/22  4:06 PM  Result Value Ref Range   HIV Screen 4th Generation wRfx Non Reactive Non Reactive    Comment: Performed at [facility], [place] [address], [place], [place] [address]  Comprehensive metabolic panel     Status: Abnormal   Collection Time: 03/06/22  5:23 AM  Result Value Ref Range   Sodium 137 135 - 145 mmol/L   Potassium 3.7 3.5 - 5.1 mmol/L   Chloride 103 98 - 111 mmol/L   CO2 24 22 - 32 mmol/L   Glucose, Bld 145 (H) 70 - 99 mg/dL    Comment: Glucose reference range applies only to samples taken after fasting for at least 8 hours.   BUN 16 6 - 20 mg/dL   Creatinine, Ser 0.58 0.44 - 1.00 mg/dL   Calcium 10.2 8.9 - 10.3 mg/dL   Total Protein 7.9 6.5 -  8.1 g/dL   Albumin 3.8 3.5 - 5.0 g/dL   AST 24 15 - 41 U/L   ALT 25 0 - 44 U/L   Alkaline Phosphatase 117 38 - 126 U/L   Total  Bilirubin 0.6 0.3 - 1.2 mg/dL   GFR, Estimated >60 >60 mL/min    Comment: (NOTE) Calculated using the CKD-EPI Creatinine Equation (2021)    Anion gap 10 5 - 15    Comment: Performed at [facility], [place] [address] [place], [place] [address]  CBC     Status: Abnormal   Collection Time: 03/06/22  5:23 AM  Result Value Ref Range   WBC 11.6 (H) 4.0 - 10.5 K/uL   RBC 3.80 (L) 3.87 - 5.11 MIL/uL   Hemoglobin 11.9 (L) 12.0 - 15.0 g/dL   HCT 35.6 (L) 36.0 - 46.0 %   MCV 93.7 80.0 - 100.0 fL   MCH 31.3 26.0 - 34.0 pg   MCHC 33.4 30.0 - 36.0 g/dL   RDW 12.6 11.5 - 15.5 %   Platelets 329 150 - 400 K/uL   nRBC 0.0 0.0 - 0.2 %    Comment: Performed at [facility], [place] [address], [place], [place] [address]  Rad Sandria Senter Session Summary     Status: None   Collection Time: 03/06/22  4:51 PM  Result Value Ref Range   Course ID [email or web address]    Course Start Date 03/05/2022    Session Number 1    Course First Treatment Date 03/06/2022  3:49 PM    Course Last Treatment Date 03/06/2022  4:50 PM    Course Elapsed Days 0    Reference Point ID Spine_L2-L4 DP    Reference Point Dosage Given to Date 3 Gy   Reference Point Session Dosage Given 3 Gy   Reference Point ID [id number]-T11 DP    Reference Point Dosage Given to Date 3 Gy   Reference Point Session Dosage Given [id number] Gy   Plan ID Spine_L2-L4    Plan Name simlspine    Plan Fractions Treated to Date 1    Plan Total Fractions Prescribed 10    Plan Prescribed Dose Per Fraction 3 Gy   Plan Total Prescribed Dose 30.000000 Gy   Plan Primary Reference Point Spine_L2-L4 DP    Plan ID Spine_T8-T11    Plan Name simlowertspine    Plan Fractions Treated to Date 1    Plan Total Fractions Prescribed 10    Plan Prescribed Dose Per Fraction 3 Gy   Plan Total Prescribed Dose 30.000000 Gy   Plan Primary Reference [facility] [email or web address] DP   Rad Sandria Senter Session Summary     Status: None   Collection Time: 03/07/22 12:23  PM  Result Value Ref Range   Course ID [email or web address]    Course Start Date 03/05/2022    Session Number 2    Course First Treatment Date 03/06/2022  3:49 PM    Course Last Treatment Date 03/07/2022 12:22 PM    Course Elapsed Days 1    Reference Point ID Spine_C3-C6 DP    Reference Point Dosage Given to Date 6 Gy   Reference Point Session Dosage Given 3 Gy   Reference Point ID Spine_L2-L4 DP    Reference Point Dosage Given to Date 6 Gy   Reference Point Session Dosage Given 3 Gy   Reference Point ID Spine_T1-T5 DP    Reference Point Dosage Given to Date 6 Gy   Reference Point Session Dosage Given  3 Gy   Reference Point ID Spine_T8-T11 DP    Reference Point Dosage Given to Date 6 Gy   Reference Point Session Dosage Given 3 Gy   Plan ID Spine_C3-C6    Plan Name simcspine    Plan Fractions Treated to Date 2    Plan Total Fractions Prescribed 10    Plan Prescribed Dose Per Fraction 3 Gy   Plan Total Prescribed Dose 30.000000 Gy   Plan Primary Reference Point Spine_C3-C6 DP    Plan ID Spine_L2-L4    Plan Name simlspine    Plan Fractions Treated to Date 2    Plan Total Fractions Prescribed 10    Plan Prescribed Dose Per Fraction 3 Gy   Plan Total Prescribed Dose 30.000000 Gy   Plan Primary Reference Point Spine_L2-L4 DP    Plan ID Spine_T1-T5    Plan Name simuppertspine    Plan Fractions Treated to Date 2    Plan Total Fractions Prescribed 10    Plan Prescribed Dose Per Fraction 3 Gy   Plan Total Prescribed Dose 30.000000 Gy   Plan Primary Reference Point Spine_T1-T5 DP    Plan ID Spine_T8-T11    Plan Name simlowertspine    Plan Fractions Treated to Date 2    Plan Total Fractions Prescribed 10    Plan Prescribed Dose Per Fraction 3 Gy   Plan Total Prescribed Dose 30.000000 Gy   Plan Primary Reference Point Spine_T8-T11 DP   CBC with Differential/Platelet     Status: Abnormal   Collection Time: 03/08/22  6:12 AM  Result Value Ref Range   WBC 7.3 4.0 - 10.5 K/uL   RBC 3.67 (L)  3.87 - 5.11 MIL/uL   Hemoglobin 11.4 (L) 12.0 - 15.0 g/dL   HCT 35.1 (L) 36.0 - 46.0 %   MCV 95.6 80.0 - 100.0 fL   MCH 31.1 26.0 - 34.0 pg   MCHC 32.5 30.0 - 36.0 g/dL   RDW 13.0 11.5 - 15.5 %   Platelets 301 150 - 400 K/uL   nRBC 0.0 0.0 - 0.2 %   Neutrophils Relative % 88 %   Neutro Abs 6.5 1.7 - 7.7 K/uL   Lymphocytes Relative 6 %   Lymphs Abs 0.4 (L) 0.7 - 4.0 K/uL   Monocytes Relative 4 %   Monocytes Absolute 0.3 0.1 - 1.0 K/uL   Eosinophils Relative 0 %   Eosinophils Absolute 0.0 0.0 - 0.5 K/uL   Basophils Relative 0 %   Basophils Absolute 0.0 0.0 - 0.1 K/uL   Immature Granulocytes 2 %   Abs Immature Granulocytes 0.13 (H) 0.00 - 0.07 K/uL    Comment: Performed at [facility], [place] [address] [place], [place] [address]  Comprehensive metabolic panel     Status: Abnormal   Collection Time: 03/08/22  6:12 AM  Result Value Ref Range   Sodium 137 135 - 145 mmol/L   Potassium 3.9 3.5 - 5.1 mmol/L   Chloride 103 98 - 111 mmol/L   CO2 26 22 - 32 mmol/L   Glucose, Bld 155 (H) 70 - 99 mg/dL    Comment: Glucose reference range applies only to samples taken after fasting for at least 8 hours.   BUN 15 6 - 20 mg/dL   Creatinine, Ser 0.57 0.44 - 1.00 mg/dL   Calcium 8.8 (L) 8.9 - 10.3 mg/dL   Total Protein 7.6 6.5 - 8.1 g/dL   Albumin 3.5 3.5 - 5.0 g/dL   AST 37 15 -  41 U/L   ALT 31 0 - 44 U/L   Alkaline Phosphatase 134 (H) 38 - 126 U/L   Total Bilirubin 0.5 0.3 - 1.2 mg/dL   GFR, Estimated >60 >60 mL/min    Comment: (NOTE) Calculated using the CKD-EPI Creatinine Equation (2021)    Anion gap 8 5 - 15    Comment: Performed at [facility], [place] [address] [place], [place] [address]  Magnesium     Status: Abnormal   Collection Time: 03/08/22  6:12 AM  Result Value Ref Range   Magnesium 2.5 (H) 1.7 - 2.4 mg/dL    Comment: Performed at [facility], [place] [address], [place], [place] [address]  Rad Sandria Senter Session  Summary     Status: None   Collection Time: 03/08/22  9:23 AM  Result Value Ref Range   Course ID [email or web address]    Course Start Date 03/05/2022    Session Number 3    Course First Treatment Date 03/06/2022  3:49 PM    Course Last Treatment Date 03/08/2022  9:21 AM    Course Elapsed Days 2    Reference Point ID Spine_C3-C6 DP    Reference Point Dosage Given to Date 9 Gy   Reference Point Session Dosage Given 3 Gy   Reference Point ID Spine_L2-L4 DP    Reference Point Dosage Given to Date 9 Gy   Reference Point Session Dosage Given 3 Gy   Reference Point ID Spine_T1-T5 DP    Reference Point Dosage Given to Date 9 Gy   Reference Point Session Dosage Given 3 Gy   Reference Point ID Spine_T8-T11 DP    Reference Point Dosage Given to Date 9 Gy   Reference Point Session Dosage Given 3 Gy   Plan ID Spine_C3-C6    Plan Name simcspine    Plan Fractions Treated to Date 3    Plan Total Fractions Prescribed 10    Plan Prescribed Dose Per Fraction 3 Gy   Plan Total Prescribed Dose 30.000000 Gy   Plan Primary Reference Point Spine_C3-C6 DP    Plan ID Spine_L2-L4    Plan Name simlspine    Plan Fractions Treated to Date 3    Plan Total Fractions Prescribed 10    Plan Prescribed Dose Per Fraction 3 Gy   Plan Total Prescribed Dose 30.000000 Gy   Plan Primary Reference Point Spine_L2-L4 DP    Plan ID Spine_T1-T5    Plan Name simuppertspine    Plan Fractions Treated to Date 3    Plan Total Fractions Prescribed 10    Plan Prescribed Dose Per Fraction 3 Gy   Plan Total Prescribed Dose 30.000000 Gy   Plan Primary Reference Point Spine_T1-T5 DP    Plan ID Spine_T8-T11    Plan Name simlowertspine    Plan Fractions Treated to Date 3    Plan Total Fractions Prescribed 10    Plan Prescribed Dose Per Fraction 3 Gy   Plan Total Prescribed Dose 30.000000 Gy   Plan Primary Reference Point Spine_T8-T11 DP   Rad Sandria Senter Session Summary     Status: None   Collection Time: 03/09/22 12:54 PM  Result Value  Ref Range   Course ID C1_Multi    Course Start Date 03/05/2022    Session Number 4    Course First Treatment Date 03/06/2022  3:49 PM    Course Last Treatment Date 03/09/2022 12:53 PM    Course Elapsed Days 3    Reference Point ID  Spine_L2-L4 DP    Reference Point Dosage Given to Date 12 Gy   Reference Point Session Dosage Given 3 Gy   Reference Point ID Spine_T1-T5 DP    Reference Point Dosage Given to Date 12 Gy   Reference Point Session Dosage Given 3 Gy   Reference Point ID Spine_T8-T11 DP    Reference Point Dosage Given to Date 12 Gy   Reference Point Session Dosage Given 3 Gy   Plan ID Spine_L2-L4    Plan Name simlspine    Plan Fractions Treated to Date 4    Plan Total Fractions Prescribed 10    Plan Prescribed Dose Per Fraction 3 Gy   Plan Total Prescribed Dose 30.000000 Gy   Plan Primary Reference Point Spine_L2-L4 DP    Plan ID Spine_T1-T5    Plan Name simuppertspine    Plan Fractions Treated to Date 4    Plan Total Fractions Prescribed 10    Plan Prescribed Dose Per Fraction 3 Gy   Plan Total Prescribed Dose 30.000000 Gy   Plan Primary Reference Point Spine_T1-T5 DP    Plan ID Spine_T8-T11    Plan Name simlowertspine    Plan Fractions Treated to Date 4    Plan Total Fractions Prescribed 10    Plan Prescribed Dose Per Fraction 3 Gy   Plan Total Prescribed Dose 30.000000 Gy   Plan Primary Reference Point Spine_T8-T11 DP   Rad Sandria Senter Session Summary     Status: None   Collection Time: 03/12/22 12:44 PM  Result Value Ref Range   Course ID C1_Multi    Course Start Date 03/05/2022    Session Number 5    Course First Treatment Date 03/06/2022  3:49 PM    Course Last Treatment Date 03/12/2022 12:43 PM    Course Elapsed Days 6    Reference Point ID Spine_C3-C6 DP    Reference Point Dosage Given to Date 15 Gy   Reference Point Session Dosage Given 3 Gy   Reference Point ID Spine_L2-L4 DP    Reference Point Dosage Given to Date 15 Gy   Reference Point Session  Dosage Given 3 Gy   Reference Point ID Spine_T1-T5 DP    Reference Point Dosage Given to Date 15 Gy   Reference Point Session Dosage Given 3 Gy   Reference Point ID Spine_T8-T11 DP    Reference Point Dosage Given to Date 15 Gy   Reference Point Session Dosage Given 3 Gy   Plan ID Spine_C3-C6    Plan Name simcspine    Plan Fractions Treated to Date 5    Plan Total Fractions Prescribed 10    Plan Prescribed Dose Per Fraction 3 Gy   Plan Total Prescribed Dose 30.000000 Gy   Plan Primary Reference Point Spine_C3-C6 DP    Plan ID Spine_L2-L4    Plan Name simlspine    Plan Fractions Treated to Date 5    Plan Total Fractions Prescribed 10    Plan Prescribed Dose Per Fraction 3 Gy   Plan Total Prescribed Dose 30.000000 Gy   Plan Primary Reference Point Spine_L2-L4 DP    Plan ID Spine_T1-T5    Plan Name simuppertspine    Plan Fractions Treated to Date 5    Plan Total Fractions Prescribed 10    Plan Prescribed Dose Per Fraction 3 Gy   Plan Total Prescribed Dose 30.000000 Gy   Plan Primary Reference Point Spine_T1-T5 DP    Plan ID Spine_T8-T11    Plan Name simlowertspine  Plan Fractions Treated to Date 5    Plan Total Fractions Prescribed 10    Plan Prescribed Dose Per Fraction 3 Gy   Plan Total Prescribed Dose 30.000000 Gy   Plan Primary Reference Point Spine_T8-T11 DP   Rad Sandria Senter Session Summary     Status: None   Collection Time: 03/13/22 12:18 PM  Result Value Ref Range   Course ID C1_Multi    Course Start Date 03/05/2022    Session Number 6    Course First Treatment Date 03/06/2022  3:49 PM    Course Last Treatment Date 03/13/2022 12:16 PM    Course Elapsed Days 7    Reference Point ID Spine_C3-C6 DP    Reference Point Dosage Given to Date 18 Gy   Reference Point Session Dosage Given 3 Gy   Reference Point ID Spine_L2-L4 DP    Reference Point Dosage Given to Date 18 Gy   Reference Point Session Dosage Given 3 Gy   Reference Point ID Spine_T1-T5 DP    Reference Point  Dosage Given to Date 18 Gy   Reference Point Session Dosage Given 3 Gy   Reference Point ID Spine_T8-T11 DP    Reference Point Dosage Given to Date 18 Gy   Reference Point Session Dosage Given 3 Gy   Plan ID Spine_C3-C6    Plan Name simcspine    Plan Fractions Treated to Date 6    Plan Total Fractions Prescribed 10    Plan Prescribed Dose Per Fraction 3 Gy   Plan Total Prescribed Dose 30.000000 Gy   Plan Primary Reference Point Spine_C3-C6 DP    Plan ID Spine_L2-L4    Plan Name simlspine    Plan Fractions Treated to Date 6    Plan Total Fractions Prescribed 10    Plan Prescribed Dose Per Fraction 3 Gy   Plan Total Prescribed Dose 30.000000 Gy   Plan Primary Reference Point Spine_L2-L4 DP    Plan ID Spine_T1-T5    Plan Name simuppertspine    Plan Fractions Treated to Date 6    Plan Total Fractions Prescribed 10    Plan Prescribed Dose Per Fraction 3 Gy   Plan Total Prescribed Dose 30.000000 Gy   Plan Primary Reference Point Spine_T1-T5 DP    Plan ID Spine_T8-T11    Plan Name simlowertspine    Plan Fractions Treated to Date 6    Plan Total Fractions Prescribed 10    Plan Prescribed Dose Per Fraction 3 Gy   Plan Total Prescribed Dose 30.000000 Gy   Plan Primary Reference Point Spine_T8-T11 DP   Rad Sandria Senter Session Summary     Status: None   Collection Time: 03/14/22 11:48 AM  Result Value Ref Range   Course ID C1_Multi    Course Start Date 03/05/2022    Session Number 7    Course First Treatment Date 03/06/2022  3:49 PM    Course Last Treatment Date 03/14/2022 11:47 AM    Course Elapsed Days 8    Reference Point ID Spine_C3-C6 DP    Reference Point Dosage Given to Date 21 Gy   Reference Point Session Dosage Given 3 Gy   Reference Point ID Spine_L2-L4 DP    Reference Point Dosage Given to Date 21 Gy   Reference Point Session Dosage Given 3 Gy   Reference Point ID Spine_T1-T5 DP    Reference Point Dosage Given to Date 21 Gy   Reference Point Session Dosage Given 3 Gy    Reference Point ID Spine_T8-T11  DP    Reference Point Dosage Given to Date 21 Gy   Reference Point Session Dosage Given 3 Gy   Plan ID Spine_C3-C6    Plan Name simcspine    Plan Fractions Treated to Date 7    Plan Total Fractions Prescribed 10    Plan Prescribed Dose Per Fraction 3 Gy   Plan Total Prescribed Dose 30.000000 Gy   Plan Primary Reference Point Spine_C3-C6 DP    Plan ID Spine_L2-L4    Plan Name simlspine    Plan Fractions Treated to Date 7    Plan Total Fractions Prescribed 10    Plan Prescribed Dose Per Fraction 3 Gy   Plan Total Prescribed Dose 30.000000 Gy   Plan Primary Reference Point Spine_L2-L4 DP    Plan ID Spine_T1-T5    Plan Name simuppertspine    Plan Fractions Treated to Date 7    Plan Total Fractions Prescribed 10    Plan Prescribed Dose Per Fraction 3 Gy   Plan Total Prescribed Dose 30.000000 Gy   Plan Primary Reference Point Spine_T1-T5 DP    Plan ID Spine_T8-T11    Plan Name simlowertspine    Plan Fractions Treated to Date 7    Plan Total Fractions Prescribed 10    Plan Prescribed Dose Per Fraction 3 Gy   Plan Total Prescribed Dose 30.000000 Gy   Plan Primary Reference Point Spine_T8-T11 DP   Rad Sandria Senter Session Summary     Status: None   Collection Time: 03/19/22 12:02 PM  Result Value Ref Range   Course ID C1_Multi    Course Start Date 03/05/2022    Session Number 8    Course First Treatment Date 03/06/2022  3:49 PM    Course Last Treatment Date 03/19/2022 12:01 PM    Course Elapsed Days 13    Reference Point ID Spine_C3-C6 DP    Reference Point Dosage Given to Date 24 Gy   Reference Point Session Dosage Given 3 Gy   Reference Point ID Spine_L2-L4 DP    Reference Point Dosage Given to Date 24 Gy   Reference Point Session Dosage Given 3 Gy   Reference Point ID Spine_T1-T5 DP    Reference Point Dosage Given to Date 24 Gy   Reference Point Session Dosage Given 3 Gy   Reference Point ID Spine_T8-T11 DP    Reference Point Dosage Given to  Date 24 Gy   Reference Point Session Dosage Given 3 Gy   Plan ID Spine_C3-C6    Plan Name simcspine    Plan Fractions Treated to Date 8    Plan Total Fractions Prescribed 10    Plan Prescribed Dose Per Fraction 3 Gy   Plan Total Prescribed Dose 30.000000 Gy   Plan Primary Reference Point Spine_C3-C6 DP    Plan ID Spine_L2-L4    Plan Name simlspine    Plan Fractions Treated to Date 8    Plan Total Fractions Prescribed 10    Plan Prescribed Dose Per Fraction 3 Gy   Plan Total Prescribed Dose 30.000000 Gy   Plan Primary Reference Point Spine_L2-L4 DP    Plan ID Spine_T1-T5    Plan Name simuppertspine    Plan Fractions Treated to Date 8    Plan Total Fractions Prescribed 10    Plan Prescribed Dose Per Fraction 3 Gy   Plan Total Prescribed Dose 30.000000 Gy   Plan Primary Reference Point Spine_T1-T5 DP    Plan ID Spine_T8-T11    Plan Name simlowertspine  Plan Fractions Treated to Date 8    Plan Total Fractions Prescribed 10    Plan Prescribed Dose Per Fraction 3 Gy   Plan Total Prescribed Dose 30.000000 Gy   Plan Primary Reference Point Spine_T8-T11 DP   Rad Sandria Senter Session Summary     Status: None   Collection Time: 03/20/22 12:34 PM  Result Value Ref Range   Course ID C1_Multi    Course Start Date 03/05/2022    Session Number 9    Course First Treatment Date 03/06/2022  3:49 PM    Course Last Treatment Date 03/20/2022 12:33 PM    Course Elapsed Days 14    Reference Point ID Spine_C3-C6 DP    Reference Point Dosage Given to Date 27 Gy   Reference Point Session Dosage Given 3 Gy   Reference Point ID Spine_L2-L4 DP    Reference Point Dosage Given to Date 27 Gy   Reference Point Session Dosage Given 3 Gy   Reference Point ID Spine_T1-T5 DP    Reference Point Dosage Given to Date 27 Gy   Reference Point Session Dosage Given 3 Gy   Reference Point ID Spine_T8-T11 DP    Reference Point Dosage Given to Date 27 Gy   Reference Point Session Dosage Given 3 Gy   Plan ID  Spine_C3-C6    Plan Name simcspine    Plan Fractions Treated to Date 9    Plan Total Fractions Prescribed 10    Plan Prescribed Dose Per Fraction 3 Gy   Plan Total Prescribed Dose 30.000000 Gy   Plan Primary Reference Point Spine_C3-C6 DP    Plan ID Spine_L2-L4    Plan Name simlspine    Plan Fractions Treated to Date 9    Plan Total Fractions Prescribed 10    Plan Prescribed Dose Per Fraction 3 Gy   Plan Total Prescribed Dose 30.000000 Gy   Plan Primary Reference Point Spine_L2-L4 DP    Plan ID Spine_T1-T5    Plan Name simuppertspine    Plan Fractions Treated to Date 9    Plan Total Fractions Prescribed 10    Plan Prescribed Dose Per Fraction 3 Gy   Plan Total Prescribed Dose 30.000000 Gy   Plan Primary Reference Point Spine_T1-T5 DP    Plan ID Spine_T8-T11    Plan Name simlowertspine    Plan Fractions Treated to Date 9    Plan Total Fractions Prescribed 10    Plan Prescribed Dose Per Fraction 3 Gy   Plan Total Prescribed Dose 30.000000 Gy   Plan Primary Reference Point Spine_T8-T11 DP   Rad Sandria Senter Session Summary     Status: None   Collection Time: 03/21/22 12:02 PM  Result Value Ref Range   Course ID C1_Multi    Course Start Date 03/05/2022    Session Number 10    Course First Treatment Date 03/06/2022  3:49 PM    Course Last Treatment Date 03/21/2022 12:00 PM    Course Elapsed Days 15    Reference Point ID Spine_C3-C6 DP    Reference Point Dosage Given to Date 30 Gy   Reference Point Session Dosage Given 3 Gy   Reference Point ID Spine_L2-L4 DP    Reference Point Dosage Given to Date 30 Gy   Reference Point Session Dosage Given 3 Gy   Reference Point ID Spine_T1-T5 DP    Reference Point Dosage Given to Date 30 Gy   Reference Point Session Dosage Given 3 Gy   Reference Point ID Spine_T8-T11 DP  Reference Point Dosage Given to Date 30 Gy   Reference Point Session Dosage Given 3 Gy   Plan ID Spine_C3-C6    Plan Name simcspine    Plan Fractions Treated to Date 10     Plan Total Fractions Prescribed 10    Plan Prescribed Dose Per Fraction 3 Gy   Plan Total Prescribed Dose 30.000000 Gy   Plan Primary Reference Point Spine_C3-C6 DP    Plan ID Spine_L2-L4    Plan Name simlspine    Plan Fractions Treated to Date 10    Plan Total Fractions Prescribed 10    Plan Prescribed Dose Per Fraction 3 Gy   Plan Total Prescribed Dose 30.000000 Gy   Plan Primary Reference Point Spine_L2-L4 DP    Plan ID Spine_T1-T5    Plan Name simuppertspine    Plan Fractions Treated to Date 10    Plan Total Fractions Prescribed 10    Plan Prescribed Dose Per Fraction 3 Gy   Plan Total Prescribed Dose 30.000000 Gy   Plan Primary Reference Point Spine_T1-T5 DP    Plan ID Spine_T8-T11    Plan Name simlowertspine    Plan Fractions Treated to Date 10    Plan Total Fractions Prescribed 10    Plan Prescribed Dose Per Fraction 3 Gy   Plan Total Prescribed Dose 30.000000 Gy   Plan Primary Reference Point Spine_T8-T11 DP   Rad Sandria Senter Session Summary     Status: None   Collection Time: 03/23/22  3:11 PM  Result Value Ref Range   Course ID C1_Multi    Course Start Date 03/05/2022    Session Number 11    Course First Treatment Date 03/06/2022  3:49 PM    Course Last Treatment Date 03/23/2022  3:10 PM    Course Elapsed Days 17    Reference Point ID Spine_L2-L4 DP    Reference Point Dosage Given to Date 33 Gy   Reference Point Session Dosage Given 3 Gy   Plan ID Spine_L2-L4:1    Plan Name simlspine    Plan Fractions Treated to Date 1    Plan Total Fractions Prescribed 1    Plan Prescribed Dose Per Fraction 3 Gy   Plan Total Prescribed Dose 3.000000 Gy   Plan Primary Reference Point Spine_L2-L4 DP   Cancer antigen 19-9     Status: Abnormal   Collection Time: 03/26/22  8:02 AM  Result Value Ref Range   CA 19-9 199 (H) 0 - 35 U/mL    Comment: (NOTE) Roche Diagnostics Electrochemiluminescence Immunoassay (ECLIA) Values obtained with different assay methods or kits cannot  be used interchangeably.  Results cannot be interpreted as absolute evidence of the presence or absence of malignant disease. Performed At: [facility] [place], [place] [id number] Rush Farmer MD [id number]   CBC with Differential ([place] Only)     Status: Abnormal   Collection Time: 03/26/22  8:02 AM  Result Value Ref Range   WBC Count 4.5 4.0 - 10.5 K/uL   RBC 3.17 (L) 3.87 - 5.11 MIL/uL   Hemoglobin 10.3 (L) 12.0 - 15.0 g/dL   HCT 30.6 (L) 36.0 - 46.0 %   MCV 96.5 80.0 - 100.0 fL   MCH 32.5 26.0 - 34.0 pg   MCHC 33.7 30.0 - 36.0 g/dL   RDW 14.4 11.5 - 15.5 %   Platelet Count 98 (L) 150 - 400 K/uL   nRBC 0.0 0.0 - 0.2 %   Neutrophils Relative % 87 %  Neutro Abs 3.9 1.7 - 7.7 K/uL   Lymphocytes Relative 5 %   Lymphs Abs 0.2 (L) 0.7 - 4.0 K/uL   Monocytes Relative 7 %   Monocytes Absolute 0.3 0.1 - 1.0 K/uL   Eosinophils Relative 1 %   Eosinophils Absolute 0.1 0.0 - 0.5 K/uL   Basophils Relative 0 %   Basophils Absolute 0.0 0.0 - 0.1 K/uL   Immature Granulocytes 0 %   Abs Immature Granulocytes 0.02 0.00 - 0.07 K/uL    Comment: Performed at [facility] Lab at [facility], [address] [place], [place] [address]  CMP ([place] only)     Status: Abnormal   Collection Time: 03/26/22  8:02 AM  Result Value Ref Range   Sodium 141 135 - 145 mmol/L   Potassium 3.4 (L) 3.5 - 5.1 mmol/L   Chloride 105 98 - 111 mmol/L   CO2 29 22 - 32 mmol/L   Glucose, Bld 104 (H) 70 - 99 mg/dL    Comment: Glucose reference range applies only to samples taken after fasting for at least 8 hours.   BUN 16 6 - 20 mg/dL   Creatinine 0.53 0.44 - 1.00 mg/dL   Calcium 9.1 8.9 - 10.3 mg/dL   Total Protein 6.5 6.5 - 8.1 g/dL   Albumin 3.7 3.5 - 5.0 g/dL   AST 17 15 - 41 U/L   ALT 34 0 - 44 U/L   Alkaline Phosphatase 81 38 - 126 U/L   Total Bilirubin 0.7 0.3 - 1.2 mg/dL   GFR, Estimated >60 >60 mL/min    Comment: (NOTE) Calculated  using the CKD-EPI Creatinine Equation (2021)    Anion gap 7 5 - 15    Comment: Performed at [facility] at [facility], [address] [place], [place] [address]  Lactate dehydrogenase     Status: Abnormal   Collection Time: 03/26/22  8:03 AM  Result Value Ref Range   LDH 237 (H) 98 - 192 U/L    Comment: Performed at [facility] at [facility], [place], [place] [address]  Prealbumin     Status: None   Collection Time: 03/26/22  8:03 AM  Result Value Ref Range   Prealbumin 19 18 - 38 mg/dL    Comment: Performed at [place] [address] [place], [place] [address]  CBC with Differential ([place] Only)     Status: Abnormal   Collection Time: 04/02/22  8:42 AM  Result Value Ref Range   WBC Count 2.7 (L) 4.0 - 10.5 K/uL   RBC 3.02 (L) 3.87 - 5.11 MIL/uL   Hemoglobin 9.6 (L) 12.0 - 15.0 g/dL   HCT 29.1 (L) 36.0 - 46.0 %   MCV 96.4 80.0 - 100.0 fL   MCH 31.8 26.0 - 34.0 pg   MCHC 33.0 30.0 - 36.0 g/dL   RDW 14.7 11.5 - 15.5 %   Platelet Count 156 150 - 400 K/uL   nRBC 0.0 0.0 - 0.2 %   Neutrophils Relative % 72 %   Neutro Abs 2.0 1.7 - 7.7 K/uL   Lymphocytes Relative 11 %   Lymphs Abs 0.3 (L) 0.7 - 4.0 K/uL   Monocytes Relative 11 %   Monocytes Absolute 0.3 0.1 - 1.0 K/uL   Eosinophils Relative 4 %   Eosinophils Absolute 0.1 0.0 - 0.5 K/uL   Basophils Relative 1 %  Basophils Absolute 0.0 0.0 - 0.1 K/uL   Immature Granulocytes 1 %   Abs Immature Granulocytes 0.02 0.00 - 0.07 K/uL    Comment: Performed at [facility] at [facility], [address], [place], [place] [address]  CMP ([place] only)     Status: Abnormal   Collection Time: 04/02/22  8:42 AM  Result Value Ref Range   Sodium 141 135 - 145 mmol/L   Potassium 2.9 (L) 3.5 - 5.1 mmol/L   Chloride 104 98 - 111 mmol/L   CO2 28 22 - 32 mmol/L   Glucose, Bld 105 (H) 70 - 99 mg/dL    Comment: Glucose  reference range applies only to samples taken after fasting for at least 8 hours.   BUN 11 6 - 20 mg/dL   Creatinine 0.49 0.44 - 1.00 mg/dL   Calcium 9.0 8.9 - 10.3 mg/dL   Total Protein 6.4 (L) 6.5 - 8.1 g/dL   Albumin 3.6 3.5 - 5.0 g/dL   AST 21 15 - 41 U/L   ALT 22 0 - 44 U/L   Alkaline Phosphatase 112 38 - 126 U/L   Total Bilirubin 0.8 0.3 - 1.2 mg/dL   GFR, Estimated >60 >60 mL/min    Comment: (NOTE) Calculated using the CKD-EPI Creatinine Equation (2021)    Anion gap 9 5 - 15    Comment: Performed at [facility] at [facility], [place], [place] [address]  Cancer antigen 19-9     Status: Abnormal   Collection Time: 04/02/22  8:42 AM  Result Value Ref Range   CA 19-9 216 (H) 0 - 35 U/mL    Comment: (NOTE) Roche Diagnostics Electrochemiluminescence Immunoassay (ECLIA) Values obtained with different assay methods or kits cannot be used interchangeably.  Results cannot be interpreted as absolute evidence of the presence or absence of malignant disease. Performed At: [facility] [place], [place] [id number] Rush Farmer MD [id number]   Lactate dehydrogenase     Status: Abnormal   Collection Time: 04/02/22  8:44 AM  Result Value Ref Range   LDH 291 (H) 98 - 192 U/L    Comment: Performed at [facility] Lab at [facility], [address], [place],  [address]  CBC with Differential ([place] Only)     Status: Abnormal   Collection Time: 04/18/22  8:20 AM  Result Value Ref Range   WBC Count 6.4 4.0 - 10.5 K/uL   RBC 2.63 (L) 3.87 - 5.11 MIL/uL   Hemoglobin 8.6 (L) 12.0 - 15.0 g/dL   HCT 26.0 (L) 36.0 - 46.0 %   MCV 98.9 80.0 - 100.0 fL   MCH 32.7 26.0 - 34.0 pg   MCHC 33.1 30.0 - 36.0 g/dL   RDW 16.8 (H) 11.5 - 15.5 %   Platelet Count 154 150 - 400 K/uL   nRBC 0.6 (H) 0.0 - 0.2 %   Neutrophils Relative % 84 %   Neutro Abs 5.3 1.7 - 7.7 K/uL   Lymphocytes Relative 7 %    Lymphs Abs 0.5 (L) 0.7 - 4.0 K/uL   Monocytes Relative 8 %   Monocytes Absolute 0.5 0.1 - 1.0 K/uL   Eosinophils Relative 0 %   Eosinophils Absolute 0.0 0.0 - 0.5 K/uL   Basophils Relative 0 %   Basophils Absolute 0.0 0.0 - 0.1 K/uL   Immature Granulocytes 1 %  Abs Immature Granulocytes 0.09 (H) 0.00 - 0.07 K/uL    Comment: Performed at [facility] at [facility], [address] [place], [place] [address]  CMP ([place] only)     Status: Abnormal   Collection Time: 04/18/22  8:20 AM  Result Value Ref Range   Sodium 142 135 - 145 mmol/L   Potassium 2.7 (LL) 3.5 - 5.1 mmol/L    Comment: REPEATED TO VERIFY CRITICAL RESULT CALLED TO, READ BACK BY AND VERIFIED WITH: VANESSA T. RN [place] [id number] BY SROY     Chloride 104 98 - 111 mmol/L   CO2 28 22 - 32 mmol/L   Glucose, Bld 120 (H) 70 - 99 mg/dL    Comment: Glucose reference range applies only to samples taken after fasting for at least 8 hours.   BUN 10 6 - 20 mg/dL   Creatinine 0.56 0.44 - 1.00 mg/dL   Calcium 8.4 (L) 8.9 - 10.3 mg/dL   Total Protein 5.8 (L) 6.5 - 8.1 g/dL   Albumin 3.6 3.5 - 5.0 g/dL   AST 20 15 - 41 U/L   ALT 20 0 - 44 U/L   Alkaline Phosphatase 144 (H) 38 - 126 U/L   Total Bilirubin 0.5 0.3 - 1.2 mg/dL   GFR, Estimated >60 >60 mL/min    Comment: (NOTE) Calculated using the CKD-EPI Creatinine Equation (2021)    Anion gap 10 5 - 15    Comment: Performed at [facility] Lab at [facility], [address], [place], [place] [address]  Cancer antigen 19-9     Status: Abnormal   Collection Time: 04/18/22  8:20 AM  Result Value Ref Range   CA 19-9 236 (H) 0 - 35 U/mL    Comment: (NOTE) Roche Diagnostics Electrochemiluminescence Immunoassay (ECLIA) Values obtained with different assay methods or kits cannot be used interchangeably.  Results cannot be interpreted as absolute evidence of the presence or absence of malignant disease. Performed At: [facility] [place], [place] [id number] Rush Farmer MD [id number]   Lactate dehydrogenase     Status: Abnormal   Collection Time: 04/18/22  8:21 AM  Result Value Ref Range   LDH 234 (H) 98 - 192 U/L    Comment: Performed at [facility] at [facility], [address], [place], [place] [address]  Prepare RBC (crossmatch)     Status: None   Collection Time: 04/18/22  8:45 AM  Result Value Ref Range   Order Confirmation      ORDER PROCESSED BY BLOOD BANK Performed at [facility], [place] [address] [place], [place] [address]   Type and screen     Status: None   Collection Time: 04/18/22  8:58 AM  Result Value Ref Range   ABO/RH(D) A NEG    Antibody Screen NEG    Sample Expiration 04/21/2022,2359    Unit Number [id number]    Blood Component Type RED CELLS,LR    Unit division 00    Status of Unit ISSUED,FINAL    Transfusion Status OK TO TRANSFUSE    Crossmatch Result Compatible    Unit Number [id number]    Blood Component Type RED CELLS,LR    Unit division 00    Status of Unit ISSUED,FINAL    Transfusion Status OK TO TRANSFUSE    Crossmatch Result      Compatible Performed at [facility], [place] [address] [place], [place] [address]   BPAM RBC     Status: None   Collection Time: 04/18/22  8:58 AM  Result Value Ref Range   ISSUE DATE / TIME 355974163845    Blood Product Unit Number [id number]    PRODUCT CODE [id number]    Unit Type and Rh 0600    Blood Product Expiration Date 888916945038    ISSUE DATE / TIME [id number]    Blood Product Unit Number [id number]    PRODUCT CODE [id number]    Unit Type and Rh 0600    Blood Product Expiration Date 078675449201   Cancer antigen 19-9     Status: Abnormal   Collection Time: 05/02/22  8:28 AM  Result Value Ref Range   CA 19-9 159 (H) 0 - 35 U/mL    Comment: (NOTE) Roche Diagnostics Electrochemiluminescence Immunoassay  (ECLIA) Values obtained with different assay methods or kits cannot be used interchangeably.  Results cannot be interpreted as absolute evidence of the presence or absence of malignant disease. Performed At: [facility] [place], [place] [id number] Rush Farmer MD [id number]   CBC with Differential ([place] Only)     Status: Abnormal   Collection Time: 05/02/22  8:28 AM  Result Value Ref Range   WBC Count 2.7 (L) 4.0 - 10.5 K/uL   RBC 3.53 (L) 3.87 - 5.11 MIL/uL   Hemoglobin 11.0 (L) 12.0 - 15.0 g/dL   HCT 33.5 (L) 36.0 - 46.0 %   MCV 94.9 80.0 - 100.0 fL   MCH 31.2 26.0 - 34.0 pg   MCHC 32.8 30.0 - 36.0 g/dL   RDW 17.0 (H) 11.5 - 15.5 %   Platelet Count 202 150 - 400 K/uL   nRBC 0.0 0.0 - 0.2 %   Neutrophils Relative % 70 %   Neutro Abs 1.9 1.7 - 7.7 K/uL   Lymphocytes Relative 12 %   Lymphs Abs 0.3 (L) 0.7 - 4.0 K/uL   Monocytes Relative 15 %   Monocytes Absolute 0.4 0.1 - 1.0 K/uL   Eosinophils Relative 0 %   Eosinophils Absolute 0.0 0.0 - 0.5 K/uL   Basophils Relative 0 %   Basophils Absolute 0.0 0.0 - 0.1 K/uL   Immature Granulocytes 3 %   Abs Immature Granulocytes 0.07 0.00 - 0.07 K/uL    Comment: Performed at [facility] at [facility], [address], [place], [place] [address]  CMP ([place] only)     Status: Abnormal   Collection Time: 05/02/22  8:28 AM  Result Value Ref Range   Sodium 137 135 - 145 mmol/L   Potassium 2.8 (L) 3.5 - 5.1 mmol/L   Chloride 107 98 - 111 mmol/L   CO2 22 22 - 32 mmol/L   Glucose, Bld 113 (H) 70 - 99 mg/dL    Comment: Glucose reference range applies only to samples taken after fasting for at least 8 hours.   BUN 7 6 - 20 mg/dL   Creatinine 0.54 0.44 - 1.00 mg/dL   Calcium 8.6 (L) 8.9 - 10.3 mg/dL   Total Protein 6.4 (L) 6.5 - 8.1 g/dL   Albumin 3.0 (L) 3.5 - 5.0 g/dL   AST 44 (H) 15 - 41 U/L   ALT 36 0 - 44 U/L   Alkaline Phosphatase 185 (H) 38 - 126 U/L   Total  Bilirubin 0.3 0.3 - 1.2 mg/dL   GFR, Estimated >60 >60 mL/min  Comment: (NOTE) Calculated using the CKD-EPI Creatinine Equation (2021)    Anion gap 8 5 - 15    Comment: Performed at [facility], [place] [place], [place] [address]  Lactate dehydrogenase     Status: Abnormal   Collection Time: 05/02/22  8:31 AM  Result Value Ref Range   LDH 340 (H) 98 - 192 U/L    Comment: Performed at [facility], [place] [address] [place], [place] [address]  Sample to Blood Bank     Status: None   Collection Time: 05/02/22  8:32 AM  Result Value Ref Range   Blood Bank Specimen SAMPLE AVAILABLE FOR TESTING    Sample Expiration      05/05/2022,2359 Performed at [facility], [place] [address], [place], [place] [address]   Comprehensive metabolic panel     Status: Abnormal   Collection Time: 05/06/22  4:30 AM  Result Value Ref Range   Sodium 140 135 - 145 mmol/L   Potassium 3.2 (L) 3.5 - 5.1 mmol/L   Chloride 109 98 - 111 mmol/L   CO2 20 (L) 22 - 32 mmol/L   Glucose, Bld 115 (H) 70 - 99 mg/dL    Comment: Glucose reference range applies only to samples taken after fasting for at least 8 hours.   BUN 6 6 - 20 mg/dL   Creatinine, Ser 0.57 0.44 - 1.00 mg/dL   Calcium 8.4 (L) 8.9 - 10.3 mg/dL   Total Protein 6.0 (L) 6.5 - 8.1 g/dL   Albumin 2.8 (L) 3.5 - 5.0 g/dL   AST 25 15 - 41 U/L   ALT 26 0 - 44 U/L   Alkaline Phosphatase 219 (H) 38 - 126 U/L   Total Bilirubin 0.7 0.3 - 1.2 mg/dL   GFR, Estimated >60 >60 mL/min    Comment: (NOTE) Calculated using the CKD-EPI Creatinine Equation (2021)    Anion gap 11 5 - 15    Comment: Performed at [facility], [place] [address] [place], [place] [address]  Lactic acid, plasma     Status: None   Collection Time: 05/06/22  4:30 AM  Result Value Ref Range   Lactic Acid, Venous 1.3 0.5 - 1.9 mmol/L    Comment: Performed at [facility], [place] [address],  [place], [place] [address]  CBC with Differential     Status: Abnormal   Collection Time: 05/06/22  4:30 AM  Result Value Ref Range   WBC 12.5 (H) 4.0 - 10.5 K/uL   RBC 3.72 (L) 3.87 - 5.11 MIL/uL   Hemoglobin 11.7 (L) 12.0 - 15.0 g/dL   HCT 36.2 36.0 - 46.0 %   MCV 97.3 80.0 - 100.0 fL   MCH 31.5 26.0 - 34.0 pg   MCHC 32.3 30.0 - 36.0 g/dL   RDW 18.0 (H) 11.5 - 15.5 %   Platelets 184 150 - 400 K/uL   nRBC 0.2 0.0 - 0.2 %   Neutrophils Relative % 82 %   Neutro Abs 10.3 (H) 1.7 - 7.7 K/uL   Lymphocytes Relative 4 %   Lymphs Abs 0.4 (L) 0.7 - 4.0 K/uL   Monocytes Relative 12 %   Monocytes Absolute 1.5 (H) 0.1 - 1.0 K/uL   Eosinophils Relative 0 %   Eosinophils Absolute 0.0 0.0 - 0.5 K/uL   Basophils Relative 0 %   Basophils Absolute 0.0 0.0 - 0.1 K/uL   Immature Granulocytes 2 %   Abs  Immature Granulocytes 0.20 (H) 0.00 - 0.07 K/uL    Comment: Performed at [facility] [place], [place] [address], [place], [place] [address]  Protime-INR     Status: Abnormal   Collection Time: 05/06/22  4:30 AM  Result Value Ref Range   Prothrombin Time 15.6 (H) 11.4 - 15.2 seconds   INR 1.3 (H) 0.8 - 1.2    Comment: (NOTE) INR goal varies based on device and disease states. Performed at [facility], [place] [address], [place], [place] [address]   Culture, blood (Routine x 2)     Status: None   Collection Time: 05/06/22  4:30 AM   Specimen: BLOOD  Result Value Ref Range   Specimen Description      BLOOD RIGHT ANTECUBITAL Performed at [place] [address] [place], [place] [address]    Special Requests      BOTTLES DRAWN AEROBIC AND ANAEROBIC Blood Culture adequate volume Performed at [place] [address], [place], [place] [address]    Culture      NO GROWTH 5 DAYS Performed at [facility], [place] [address] [place],  [place] [address]    Report Status 05/11/2022 FINAL   Troponin I (High Sensitivity)      Status: None   Collection Time: 05/06/22  4:30 AM  Result Value Ref Range   Troponin I (High Sensitivity) 17 <18 ng/L    Comment: (NOTE) Elevated high sensitivity troponin I (hsTnI) values and significant  changes across serial measurements may suggest ACS but many other  chronic and acute conditions are known to elevate hsTnI results.  Refer to the "Links" section for chest pain algorithms and additional  guidance. Performed at [facility], [place] [address] [place], [place] [address]   Troponin I (High Sensitivity)     Status: Abnormal   Collection Time: 05/06/22  6:33 AM  Result Value Ref Range   Troponin I (High Sensitivity) 18 (H) <18 ng/L    Comment: (NOTE) Elevated high sensitivity troponin I (hsTnI) values and significant  changes across serial measurements may suggest ACS but many other  chronic and acute conditions are known to elevate hsTnI results.  Refer to the "Links" section for chest pain algorithms and additional  guidance. Performed at [facility], [place] [address], [place], [place] [address]   Magnesium     Status: Abnormal   Collection Time: 05/06/22  6:33 AM  Result Value Ref Range   Magnesium 1.5 (L) 1.7 - 2.4 mg/dL    Comment: Performed at [facility], [place] [address] [place], [place] [address]  Phosphorus     Status: None   Collection Time: 05/06/22  6:33 AM  Result Value Ref Range   Phosphorus 2.9 2.5 - 4.6 mg/dL    Comment: Performed at [facility], [place] [address] [place],  [address]  Procalcitonin - Baseline     Status: None   Collection Time: 05/06/22  6:33 AM  Result Value Ref Range   Procalcitonin <0.10 ng/mL    Comment:        Interpretation: PCT (Procalcitonin) <= 0.5 ng/mL: Systemic infection (sepsis) is not likely. Local bacterial infection is possible. (NOTE)       Sepsis PCT Algorithm           Lower Respiratory Tract  Infection PCT Algorithm    ----------------------------     ----------------------------         PCT < 0.25 ng/mL                PCT < 0.10 ng/mL          Strongly encourage             Strongly discourage   discontinuation of antibiotics    initiation of antibiotics    ----------------------------     -----------------------------       PCT 0.25 - 0.50 ng/mL            PCT 0.10 - 0.25 ng/mL               OR       >80% decrease in PCT            Discourage initiation of                                            antibiotics      Encourage discontinuation           of antibiotics    ----------------------------     -----------------------------         PCT >= 0.50 ng/mL              PCT 0.26 - 0.50 ng/mL               AND        <80% decrease in PCT             Encourage initiation of                                             antibiotics       Encourage continuation           of antibiotics    ----------------------------     -----------------------------        PCT >= 0.50 ng/mL                  PCT > 0.50 ng/mL               AND         increase in PCT                  Strongly encourage                                      initiation of antibiotics    Strongly encourage escalation           of antibiotics                                     -----------------------------                                           PCT <= 0.25 ng/mL  OR                                        > 80% decrease in PCT                                      Discontinue / Do not initiate                                             antibiotics  Performed at [place] [address], [place], [place] [address]   Resp panel by RT-PCR (RSV, Flu A&B, Covid) Anterior Nasal Swab     Status: None   Collection Time: 05/06/22  6:48 AM   Specimen: Anterior Nasal Swab  Result Value Ref Range   SARS Coronavirus 2 by RT PCR NEGATIVE NEGATIVE     Comment: (NOTE) SARS-CoV-2 target nucleic acids are NOT DETECTED.  The SARS-CoV-2 RNA is generally detectable in upper respiratory specimens during the acute phase of infection. The lowest concentration of SARS-CoV-2 viral copies this assay can detect is 138 copies/mL. A negative result does not preclude SARS-Cov-2 infection and should not be used as the sole basis for treatment or other patient management decisions. A negative result may occur with  improper specimen collection/handling, submission of specimen other than nasopharyngeal swab, presence of viral mutation(s) within the areas targeted by this assay, and inadequate number of viral copies(<138 copies/mL). A negative result must be combined with clinical observations, patient history, and epidemiological information. The expected result is Negative.  Fact Sheet for Patients:  EntrepreneurPulse.com.au  Fact Sheet for Healthcare Providers:  IncredibleEmployment.be  This test is no t yet approved or cleared by the [place] FDA and  has been authorized for detection and/or diagnosis of SARS-CoV-2 by FDA under an Emergency Use Authorization (EUA). This EUA will remain  in effect (meaning this test can be used) for the duration of the COVID-19 declaration under Section 564(b)(1) of the Act, 21 U.S.C.section 360bbb-3(b)(1), unless the authorization is terminated  or revoked sooner.       Influenza A by PCR NEGATIVE NEGATIVE   Influenza B by PCR NEGATIVE NEGATIVE    Comment: (NOTE) The Xpert Xpress SARS-CoV-2/FLU/RSV plus assay is intended as an aid in the diagnosis of influenza from Nasopharyngeal swab specimens and should not be used as a sole basis for treatment. Nasal washings and aspirates are unacceptable for Xpert Xpress SARS-CoV-2/FLU/RSV testing.  Fact Sheet for Patients: EntrepreneurPulse.com.au  Fact Sheet for Healthcare  Providers: IncredibleEmployment.be  This test is not yet approved or cleared by the [place] FDA and has been authorized for detection and/or diagnosis of SARS-CoV-2 by FDA under an Emergency Use Authorization (EUA). This EUA will remain in effect (meaning this test can be used) for the duration of the COVID-19 declaration under Section 564(b)(1) of the Act, 21 U.S.C. section 360bbb-3(b)(1), unless the authorization is terminated or revoked.     Resp Syncytial Virus by PCR NEGATIVE NEGATIVE    Comment: (NOTE) Fact Sheet for Patients: EntrepreneurPulse.com.au  Fact Sheet for Healthcare Providers: IncredibleEmployment.be  This test is not yet approved or cleared by the [place] FDA and has been authorized for detection  and/or diagnosis of SARS-CoV-2 by FDA under an Emergency Use Authorization (EUA). This EUA will remain in effect (meaning this test can be used) for the duration of the COVID-19 declaration under Section 564(b)(1) of the Act, 21 U.S.C. section 360bbb-3(b)(1), unless the authorization is terminated or revoked.  Performed at [facility], [place] [address] [place], [place] [address]   Urinalysis, Routine w reflex microscopic Urine, Clean Catch     Status: Abnormal   Collection Time: 05/06/22  9:09 AM  Result Value Ref Range   Color, Urine YELLOW YELLOW   APPearance HAZY (A) CLEAR   Specific Gravity, Urine 1.012 1.005 - 1.030   pH 6.0 5.0 - 8.0   Glucose, UA NEGATIVE NEGATIVE mg/dL   Hgb urine dipstick NEGATIVE NEGATIVE   Bilirubin Urine NEGATIVE NEGATIVE   Ketones, ur NEGATIVE NEGATIVE mg/dL   Protein, ur NEGATIVE NEGATIVE mg/dL   Nitrite NEGATIVE NEGATIVE   Leukocytes,Ua NEGATIVE NEGATIVE    Comment: Performed at [place] [address] [place], [place] [address]  CBC with Differential/Platelet     Status: Abnormal   Collection Time: 05/07/22 10:16 AM   Result Value Ref Range   WBC 12.3 (H) 4.0 - 10.5 K/uL   RBC 3.01 (L) 3.87 - 5.11 MIL/uL   Hemoglobin 9.4 (L) 12.0 - 15.0 g/dL   HCT 29.8 (L) 36.0 - 46.0 %   MCV 99.0 80.0 - 100.0 fL   MCH 31.2 26.0 - 34.0 pg   MCHC 31.5 30.0 - 36.0 g/dL   RDW 17.9 (H) 11.5 - 15.5 %   Platelets 167 150 - 400 K/uL   nRBC 0.2 0.0 - 0.2 %   Neutrophils Relative % 82 %   Neutro Abs 10.1 (H) 1.7 - 7.7 K/uL   Lymphocytes Relative 4 %   Lymphs Abs 0.5 (L) 0.7 - 4.0 K/uL   Monocytes Relative 13 %   Monocytes Absolute 1.6 (H) 0.1 - 1.0 K/uL   Eosinophils Relative 0 %   Eosinophils Absolute 0.0 0.0 - 0.5 K/uL   Basophils Relative 0 %   Basophils Absolute 0.0 0.0 - 0.1 K/uL   Immature Granulocytes 1 %   Abs Immature Granulocytes 0.13 (H) 0.00 - 0.07 K/uL    Comment: Performed at [facility], [place] [address], [place], [place] [address]  Comprehensive metabolic panel     Status: Abnormal   Collection Time: 05/07/22 10:16 AM  Result Value Ref Range   Sodium 140 135 - 145 mmol/L   Potassium 3.3 (L) 3.5 - 5.1 mmol/L   Chloride 107 98 - 111 mmol/L   CO2 23 22 - 32 mmol/L   Glucose, Bld 82 70 - 99 mg/dL    Comment: Glucose reference range applies only to samples taken after fasting for at least 8 hours.   BUN <5 (L) 6 - 20 mg/dL   Creatinine, Ser 0.43 (L) 0.44 - 1.00 mg/dL   Calcium 7.8 (L) 8.9 - 10.3 mg/dL   Total Protein 5.5 (L) 6.5 - 8.1 g/dL   Albumin 2.3 (L) 3.5 - 5.0 g/dL   AST 20 15 - 41 U/L   ALT 20 0 - 44 U/L   Alkaline Phosphatase 217 (H) 38 - 126 U/L   Total Bilirubin 0.5 0.3 - 1.2 mg/dL   GFR, Estimated >60 >60 mL/min    Comment: (NOTE) Calculated using the CKD-EPI Creatinine Equation (2021)    Anion gap 10 5 - 15    Comment: Performed at  [facility], [place] [address], [place], [place] [address]  Phosphorus     Status: None   Collection Time: 05/07/22 10:16 AM  Result Value Ref Range   Phosphorus 3.2 2.5 - 4.6 mg/dL    Comment: Performed at [facility], [place] [address], [place], [place] [address]  Magnesium     Status: None   Collection Time: 05/07/22 10:16 AM  Result Value Ref Range   Magnesium 2.0 1.7 - 2.4 mg/dL    Comment: Performed at [facility], [place] [address], [place], [place] [address]  Culture, blood (Routine x 2)     Status: None   Collection Time: 05/07/22 12:05 PM   Specimen: BLOOD  Result Value Ref Range   Specimen Description      BLOOD PORTA CATH Performed at [place] [address] [place], [place] [address]    Special Requests      BOTTLES DRAWN AEROBIC AND ANAEROBIC Blood Culture adequate volume Performed at [place] [address], [place], [place] [address]    Culture      NO GROWTH 5 DAYS Performed at [facility], [place] [address], [place], [place] [address]    Report Status 05/12/2022 FINAL   CBC with Differential/Platelet     Status: Abnormal   Collection Time: 05/08/22  3:36 AM  Result Value Ref Range   WBC 9.6 4.0 - 10.5 K/uL   RBC 2.95 (L) 3.87 - 5.11 MIL/uL   Hemoglobin 9.2 (L) 12.0 - 15.0 g/dL   HCT 28.8 (L) 36.0 - 46.0 %   MCV 97.6 80.0 - 100.0 fL   MCH 31.2 26.0 - 34.0 pg   MCHC 31.9 30.0 - 36.0 g/dL   RDW 17.6 (H) 11.5 - 15.5 %   Platelets 180 150 - 400 K/uL   nRBC 0.0 0.0 - 0.2 %   Neutrophils Relative % 80 %   Neutro Abs 7.7 1.7 - 7.7 K/uL   Lymphocytes Relative 5 %   Lymphs Abs 0.5 (L) 0.7 - 4.0 K/uL   Monocytes Relative 13 %   Monocytes Absolute 1.3 (H) 0.1 - 1.0 K/uL   Eosinophils Relative 1 %   Eosinophils Absolute 0.1 0.0 - 0.5 K/uL   Basophils Relative 0 %   Basophils Absolute 0.0 0.0 - 0.1 K/uL   Immature Granulocytes 1 %   Abs Immature Granulocytes 0.08 (H) 0.00 - 0.07 K/uL    Comment: Performed at [facility], [place] [address] [place], [place] [address]  Comprehensive metabolic panel     Status: Abnormal   Collection Time: 05/08/22  3:36 AM  Result Value  Ref Range   Sodium 138 135 - 145 mmol/L   Potassium 2.9 (L) 3.5 - 5.1 mmol/L   Chloride 105 98 - 111 mmol/L   CO2 25 22 - 32 mmol/L   Glucose, Bld 84 70 - 99 mg/dL    Comment: Glucose reference range applies only to samples taken after fasting for at least 8 hours.   BUN 5 (L) 6 - 20 mg/dL   Creatinine, Ser 0.36 (L) 0.44 - 1.00 mg/dL   Calcium 7.7 (L) 8.9 - 10.3 mg/dL   Total Protein 5.4 (L) 6.5 - 8.1 g/dL   Albumin 2.3 (L) 3.5 - 5.0 g/dL   AST 18 15 - 41 U/L   ALT 17 0 - 44 U/L   Alkaline Phosphatase 216 (H) 38 - 126 U/L  Total Bilirubin 0.5 0.3 - 1.2 mg/dL   GFR, Estimated >60 >60 mL/min    Comment: (NOTE) Calculated using the CKD-EPI Creatinine Equation (2021)    Anion gap 8 5 - 15    Comment: Performed at [facility], [place] [address] [place], [place] [address]  Phosphorus     Status: None   Collection Time: 05/08/22  3:36 AM  Result Value Ref Range   Phosphorus 3.4 2.5 - 4.6 mg/dL    Comment: Performed at [facility], [place] [address] [place], [place] [address]  Magnesium     Status: None   Collection Time: 05/08/22  3:36 AM  Result Value Ref Range   Magnesium 2.0 1.7 - 2.4 mg/dL    Comment: Performed at [facility], [place] [address], [place], [place] [address]  CBC with Differential/Platelet     Status: Abnormal   Collection Time: 05/09/22  3:46 AM  Result Value Ref Range   WBC 9.0 4.0 - 10.5 K/uL   RBC 2.73 (L) 3.87 - 5.11 MIL/uL   Hemoglobin 8.7 (L) 12.0 - 15.0 g/dL   HCT 26.5 (L) 36.0 - 46.0 %   MCV 97.1 80.0 - 100.0 fL   MCH 31.9 26.0 - 34.0 pg   MCHC 32.8 30.0 - 36.0 g/dL   RDW 17.5 (H) 11.5 - 15.5 %   Platelets 175 150 - 400 K/uL   nRBC 0.0 0.0 - 0.2 %   Neutrophils Relative % 79 %   Neutro Abs 7.0 1.7 - 7.7 K/uL   Lymphocytes Relative 5 %   Lymphs Abs 0.5 (L) 0.7 - 4.0 K/uL   Monocytes Relative 14 %   Monocytes Absolute 1.3 (H) 0.1 - 1.0 K/uL   Eosinophils Relative 1 %   Eosinophils Absolute 0.1  0.0 - 0.5 K/uL   Basophils Relative 0 %   Basophils Absolute 0.0 0.0 - 0.1 K/uL   Immature Granulocytes 1 %   Abs Immature Granulocytes 0.08 (H) 0.00 - 0.07 K/uL    Comment: Performed at [facility], [place] [address] [place], [place] [address]   *Note: Due to a large number of results and/or encounters for the requested time period, some results have not been displayed. A complete set of results can be found in Results Review.

## 2022-05-17 NOTE — Progress Notes (Signed)
Subjective:   By signing my name below, I, [name], attest that this documentation has been prepared under the direction and in the presence of [name], DO. 05/17/2022   Patient ID: Makayla Good, female    DOB: 05/11/1970, 51 y.o.   MRN: [id number]  Chief Complaint  Patient presents with  . Hospitalization Follow-up    Pneumonia, sepsis    HPI Patient is in today for a hospital follow up visit.   She was admitted to the hospital from he ED on 05/06/2022 for sepsis and pneumonia. Her potasium was low during follow up labs from her oncologist.  She has swelling in her ankles. She reports her ankles are typically more swollen.   Past Medical History:  Diagnosis Date  . Asthma   . Eczema   . Fibroid   . H/O oophorectomy   . Hypertension   . Obesity   . Pancreatic adenocarcinoma ([place]) 03/01/2022  . Polycystic ovary    Left  . Sleep apnea    no CPAP per MD    Past Surgical History:  Procedure Laterality Date  . BACK SURGERY     10yr ago  . BREAST BIOPSY Right 2019   fibroadenoma  . CESAREAN SECTION  02/01/2005  . CESAREAN SECTION  2001  . IR IMAGING GUIDED PORT INSERTION  03/07/2022  . LAPAROSCOPIC GASTRIC SLEEVE RESECTION N/A 07/29/2017   Procedure: LAPAROSCOPIC GASTRIC SLEEVE RESECTION WITH UPPER ENDO ;  Surgeon: CClovis Riley MD;  Location: [facility];  Service: General;  Laterality: N/A;  . OOPHORECTOMY     Rt.removed  . TUBAL LIGATION  02/01/2005    Family History  Problem Relation Age of Onset  . Hypertension Mother   . Schizophrenia Mother   . Dementia Mother   . Mental illness Mother        schizophrenia, dementia  . Hypertension Father   . Coronary artery disease Father        Stent  . Alzheimer's disease Father   . Hypertension Sister   . Hypertension Sister   . Arthritis Sister        rheumatoid  . Dementia Maternal Aunt   . Kidney disease Other   . Colon cancer Neg Hx   . Stomach cancer Neg Hx   . Pancreatic  cancer Neg Hx   . Esophageal cancer Neg Hx   . Rectal cancer Neg Hx   . Breast cancer Neg Hx     Social History   Socioeconomic History  . Marital status: Married    Spouse name: Not on file  . Number of children: Not on file  . Years of education: 119 . Highest education level: Not on file  Occupational History  . Occupation: release lHorticulturist, [employer] Tobacco Use  . Smoking status: Former    Packs/day: 0.50    Years: 30.00    Total pack years: 15.00    Types: Cigarettes, E-cigarettes    Start date: 04/28/2020    Quit date: 2022    Years since quitting: 2.0  . Smokeless tobacco: Never  . Tobacco comments:    VAPES  Vaping Use  . Vaping Use: Every day  . Substances: Nicotine  Substance and Sexual Activity  . Alcohol use: Yes    Comment: occasional  . Drug use: No  . Sexual activity: Not Currently    Partners: Male    Birth control/protection: Surgical  Comment: Tubal/been with same person over 20 years  Other Topics Concern  . Not on file  Social History Narrative   Exercise--gym, every other day--- at least 3 days a week   Social Determinants of Health   Financial Resource Strain: Low Risk  (02/26/2022)   Overall Financial Resource Strain (CARDIA)   . Difficulty of Paying Living Expenses: Not hard at all  Food Insecurity: No Food Insecurity (05/14/2022)   Hunger Vital Sign   . Worried About Charity fundraiser in the Last Year: Never true   . Ran Out of Food in the Last Year: Never true  Transportation Needs: No Transportation Needs (05/14/2022)   PRAPARE - Transportation   . Lack of Transportation (Medical): No   . Lack of Transportation (Non-Medical): No  Physical Activity: Not on file  Stress: Not on file  Social Connections: Not on file  Intimate Partner Violence: Not At Risk (05/07/2022)   Humiliation, Afraid, Rape, and Kick questionnaire   . Fear of Current or Ex-Partner: No   . Emotionally Abused: No   . Physically Abused: No   .  Sexually Abused: No    Outpatient Medications Prior to Visit  Medication Sig Dispense Refill  . acetaminophen (TYLENOL) 325 MG tablet Take 2 tablets (650 mg total) by mouth every 6 (six) hours as needed for mild pain (or Fever >/= 101).    . cyclobenzaprine (FLEXERIL) 10 MG tablet TAKE 1 TABLET BY MOUTH AT BEDTIME AS NEEDED FOR MUSCLE SPASMS 30 tablet 0  . dexamethasone (DECADRON) 4 MG tablet Take 2 tablets (8 mg total) by mouth daily. Take 2 tablets daily x 3 days starting the day after chemotherapy. Take with food. 30 tablet 1  . Ensure Max Protein (ENSURE MAX PROTEIN) LIQD Take 330 mLs (11 oz total) by mouth daily.    . fluticasone (FLONASE) 50 MCG/ACT nasal spray SPRAY 2 SPRAYS INTO EACH NOSTRIL EVERY DAY (Patient taking differently: Place 1 spray into both nostrils daily as needed for allergies.) 48 mL 2  . guaiFENesin (MUCINEX) 600 MG 12 hr tablet Take 2 tablets (1,200 mg total) by mouth 2 (two) times daily for 5 days. 20 tablet 0  . HYDROcodone bit-homatropine (HYCODAN) 5-1.5 MG/5ML syrup Take 5 mLs by mouth every 4 (four) hours as needed for cough. 65 mL 0  . Ipratropium-Albuterol (COMBIVENT) 20-100 MCG/ACT AERS respimat Inhale 1 puff into the lungs 3 (three) times daily for 5 days, THEN 1 puff every 6 (six) hours as needed for wheezing. 1 each 1  . lidocaine-prilocaine (EMLA) cream Apply to affected area once 30 g 3  . loperamide (IMODIUM) 2 MG capsule Take 2 tabs by mouth with first loose stool, then 1 tab with each additional loose stool as needed. Do not exceed 8 tabs in a 24-hour period 60 capsule 5  . Nutritional Supplements (,FEEDING SUPPLEMENT, PROSOURCE PLUS) liquid Take 30 mLs by mouth 3 (three) times daily between meals.    Marland Kitchen omeprazole (PRILOSEC) 40 MG capsule Take 1 capsule (40 mg total) by mouth daily. 90 capsule 3  . ondansetron (ZOFRAN) 8 MG tablet Take 1 tablet (8 mg total) by mouth every 8 (eight) hours as needed for nausea or vomiting. Start on the third day after  cisplatin 30 tablet 1  . Oxycodone HCl 10 MG TABS Take 1 tablet (10 mg total) by mouth every 6 (six) hours as needed. (Patient taking differently: Take 10 mg by mouth every 6 (six) hours as needed (pain).) 120 tablet  0  . prochlorperazine (COMPAZINE) 10 MG tablet Take 1 tablet (10 mg total) by mouth every 6 (six) hours as needed for nausea or vomiting (Nausea or vomiting). 30 tablet 1  . temazepam (RESTORIL) 15 MG capsule Take 1 capsule (15 mg total) by mouth at bedtime as needed for sleep. 30 capsule 0   Facility-Administered Medications Prior to Visit  Medication Dose Route Frequency Provider Last Rate Last Admin  . sodium chloride flush (NS) 0.9 % injection 10 mL  10 mL Intravenous PRN Volanda Napoleon, MD   10 mL at 03/26/22 0927    No Known Allergies  Review of Systems  Constitutional:  Negative for fever and malaise/fatigue.  HENT:  Negative for congestion.   Eyes:  Negative for blurred vision.  Respiratory:  Negative for cough, sputum production and shortness of breath.   Cardiovascular:  Positive for leg swelling. Negative for chest pain and palpitations.  Gastrointestinal:  Negative for abdominal pain, blood in stool and nausea.  Genitourinary:  Negative for dysuria and frequency.  Musculoskeletal:  Negative for falls.  Skin:  Negative for rash.  Neurological:  Negative for dizziness, loss of consciousness and headaches.  Endo/Heme/Allergies:  Negative for environmental allergies.  Psychiatric/Behavioral:  Negative for depression. The patient is not nervous/anxious.        Objective:    Physical Exam Vitals and nursing note reviewed.  Constitutional:      General: She is not in acute distress.    Appearance: Normal appearance. She is not ill-appearing.  HENT:     Head: Normocephalic and atraumatic.     Right Ear: External ear normal.     Left Ear: External ear normal.  Eyes:     Extraocular Movements: Extraocular movements intact.     Pupils: Pupils are equal, round,  and reactive to light.  Cardiovascular:     Rate and Rhythm: Normal rate and regular rhythm.     Heart sounds: Normal heart sounds. No murmur heard.    No gallop.  Pulmonary:     Effort: Pulmonary effort is normal. No respiratory distress.     Breath sounds: Normal breath sounds. No wheezing or rales.  Skin:    General: Skin is warm and dry.  Neurological:     Mental Status: She is alert and oriented to person, place, and time.  Psychiatric:        Judgment: Judgment normal.    BP 120/84 (BP Location: Left Arm, Patient Position: Sitting, Cuff Size: Normal)   Pulse (!) 115   Temp 97.9 F (36.6 C) (Oral)   Resp 18   Ht '5\' 5"'$  (1.651 m)   Wt 168 lb 6.4 oz (76.4 kg)   LMP 07/27/2020 (Within Days)   SpO2 97%   BMI 28.02 kg/m  Wt Readings from Last 3 Encounters:  05/17/22 168 lb 6.4 oz (76.4 kg)  05/16/22 168 lb (76.2 kg)  05/12/22 178 lb 5.6 oz (80.9 kg)       Assessment & Plan:  Hypertension, unspecified type Assessment & Plan: Well controlled, no changes to meds. Encouraged heart healthy diet such as the DASH diet and exercise as tolerated.     Pneumonia due to infectious organism, unspecified laterality, unspecified part of lung Assessment & Plan: Pt doing better  Recent Results (from the past 2160 hour(s))  Cancer antigen 19-9     Status: Abnormal   Collection Time: 02/19/22 10:39 AM  Result Value Ref Range   CA 19-9 113 (H) 0 - 35  U/mL    Comment: (NOTE) Roche Diagnostics Electrochemiluminescence Immunoassay (ECLIA) Values obtained with different assay methods or kits cannot be used interchangeably.  Results cannot be interpreted as absolute evidence of the presence or absence of malignant disease. Performed At: [facility] [place], [place] [id number] Rush Farmer MD NW:[id number]   CBC with Differential ([place] Only)     Status: Abnormal   Collection Time: 02/19/22 10:39 AM  Result Value Ref Range   WBC Count 5.1 4.0 - 10.5  K/uL   RBC 3.76 (L) 3.87 - 5.11 MIL/uL   Hemoglobin 11.9 (L) 12.0 - 15.0 g/dL   HCT 35.3 (L) 36.0 - 46.0 %   MCV 93.9 80.0 - 100.0 fL   MCH 31.6 26.0 - 34.0 pg   MCHC 33.7 30.0 - 36.0 g/dL   RDW 12.2 11.5 - 15.5 %   Platelet Count 257 150 - 400 K/uL   nRBC 0.0 0.0 - 0.2 %   Neutrophils Relative % 52 %   Neutro Abs 2.7 1.7 - 7.7 K/uL   Lymphocytes Relative 35 %   Lymphs Abs 1.8 0.7 - 4.0 K/uL   Monocytes Relative 7 %   Monocytes Absolute 0.4 0.1 - 1.0 K/uL   Eosinophils Relative 4 %   Eosinophils Absolute 0.2 0.0 - 0.5 K/uL   Basophils Relative 1 %   Basophils Absolute 0.0 0.0 - 0.1 K/uL   Immature Granulocytes 1 %   Abs Immature Granulocytes 0.05 0.00 - 0.07 K/uL    Comment: Performed at [facility] at [facility], [address], [place], [place] [address]  CMP ([place] only)     Status: Abnormal   Collection Time: 02/19/22 10:39 AM  Result Value Ref Range   Sodium 141 135 - 145 mmol/L   Potassium 3.4 (L) 3.5 - 5.1 mmol/L   Chloride 103 98 - 111 mmol/L   CO2 28 22 - 32 mmol/L   Glucose, Bld 102 (H) 70 - 99 mg/dL    Comment: Glucose reference range applies only to samples taken after fasting for at least 8 hours.   BUN 9 6 - 20 mg/dL   Creatinine 0.65 0.44 - 1.00 mg/dL   Calcium 10.0 8.9 - 10.3 mg/dL   Total Protein 7.5 6.5 - 8.1 g/dL   Albumin 4.3 3.5 - 5.0 g/dL   AST 20 15 - 41 U/L   ALT 14 0 - 44 U/L   Alkaline Phosphatase 96 38 - 126 U/L   Total Bilirubin 0.7 0.3 - 1.2 mg/dL   GFR, Estimated >60 >60 mL/min    Comment: (NOTE) Calculated using the CKD-EPI Creatinine Equation (2021)    Anion gap 10 5 - 15    Comment: Performed at [facility], [place] [address], [place], [place] [address]  Lactate dehydrogenase     Status: Abnormal   Collection Time: 02/19/22 10:40 AM  Result Value Ref Range   LDH 238 (H) 98 - 192 U/L    Comment: Performed at [facility], [place] [address]  [place], [place] [address]  Prealbumin     Status: Abnormal   Collection Time: 02/19/22 10:40 AM  Result Value Ref Range   Prealbumin 15 (L) 18 - 38 mg/dL    Comment: Performed at [place] [address], [place], [place] [address]  CBC upon arrival     Status: Abnormal   Collection Time: 02/20/22 11:28 AM  Result Value Ref Range   WBC 5.0 4.0 - 10.5 K/uL   RBC 3.69 (L) 3.87 - 5.11 MIL/uL   Hemoglobin 12.0 12.0 - 15.0 g/dL   HCT 34.4 (L) 36.0 - 46.0 %   MCV 93.2 80.0 - 100.0 fL   MCH 32.5 26.0 - 34.0 pg   MCHC 34.9 30.0 - 36.0 g/dL   RDW 12.4 11.5 - 15.5 %   Platelets 245 150 - 400 K/uL   nRBC 0.0 0.0 - 0.2 %    Comment: Performed at [facility], [place] [address] [place], [place] [address]  Protime-INR upon arrival     Status: None   Collection Time: 02/20/22 11:28 AM  Result Value Ref Range   Prothrombin Time 13.3 11.4 - 15.2 seconds   INR 1.0 0.8 - 1.2    Comment: (NOTE) INR goal varies based on device and disease states. Performed at [facility], [place] [address], [place], [place] [address]   Surgical pathology     Status: None   Collection Time: 02/20/22  2:03 PM  Result Value Ref Range   SURGICAL PATHOLOGY      SURGICAL PATHOLOGY CASE: [id number] PATIENT: Venia Minks Surgical Pathology Report     Clinical History: pancreatic mass, now with indeterminate lesions within liver worrisome for mets (cm)     FINAL MICROSCOPIC DIAGNOSIS:  A. LIVER LESION, NEEDLE CORE BIOPSY: Metastatic adenocarcinoma. Nonneoplastic portion of the liver is normal. Please see comment.  Comment: The following immunostains are performed with appropriate controls: CDX2: Positive. CK7: Positive in isolated cells. CK20: Negative. CK5/6: Negative. P63: Negative. TTF-1: Negative. GATA3: Negative.  The patient's clinical history is reviewed.  In view of the mass in the pancreas and results immunostains this most likely represents metastatic adenocarcinoma  from a primary in the pancreas.  Dr. Saralyn Pilar has also reviewed this case and concurs with the above interpretation.   GROSS DESCRIPTION:  Received in formalin are 4 cores of gray-white to dark brown soft tissue which range fr om 2 x 0.1 cm to 2.4 x 0.1 cm, and are divided among 2 blocks for routine histology.  SW 02/20/2022   Final Diagnosis performed by Unknown Jim, MD.   Electronically signed 02/22/2022 Technical and / or Professional components performed at [facility]. [facility], [place] [address] [place], [place] [address].  Immunohistochemistry Technical component (if applicable) was performed at [facility]. [address], [place], [place], [place] [address].   IMMUNOHISTOCHEMISTRY DISCLAIMER (if applicable): Some of these immunohistochemical stains may have been developed and the performance characteristics determine by [facility]. Some may not have been cleared or approved by the U.S. Food and Drug Administration. The FDA has determined that such clearance or approval is not necessary. This test is used for clinical purposes. It should not be regarded as investigational or for research. This laboratory is certified under the [place]) as qualified to perform high complexity clinical laboratory testing.  The controls stained appropriately.   CBC with Differential ([facility] Only)     Status: Abnormal   Collection Time: 02/28/22  1:43 PM  Result Value Ref Range   WBC Count 6.4 4.0 - 10.5 K/uL   RBC 3.77 (L) 3.87 - 5.11 MIL/uL   Hemoglobin 11.8 (L) 12.0 - 15.0 g/dL   HCT 35.7 (L) 36.0 - 46.0 %   MCV 94.7 80.0 - 100.0 fL   MCH 31.3 26.0 - 34.0 pg  MCHC 33.1 30.0 - 36.0 g/dL   RDW 12.4 11.5 - 15.5 %   Platelet Count 272 150 - 400 K/uL   nRBC 0.0 0.0 - 0.2 %   Neutrophils Relative % 56 %   Neutro Abs 3.6 1.7 - 7.7 K/uL   Lymphocytes Relative 33 %   Lymphs Abs 2.1 0.7 -  4.0 K/uL   Monocytes Relative 8 %   Monocytes Absolute 0.5 0.1 - 1.0 K/uL   Eosinophils Relative 2 %   Eosinophils Absolute 0.2 0.0 - 0.5 K/uL   Basophils Relative 1 %   Basophils Absolute 0.1 0.0 - 0.1 K/uL   Immature Granulocytes 0 %   Abs Immature Granulocytes 0.02 0.00 - 0.07 K/uL    Comment: Performed at [facility] at [facility], [address] [place], [place] [address]  CMP ([place] only)     Status: Abnormal   Collection Time: 02/28/22  1:43 PM  Result Value Ref Range   Sodium 140 135 - 145 mmol/L   Potassium 4.0 3.5 - 5.1 mmol/L   Chloride 102 98 - 111 mmol/L   CO2 28 22 - 32 mmol/L   Glucose, Bld 127 (H) 70 - 99 mg/dL    Comment: Glucose reference range applies only to samples taken after fasting for at least 8 hours.   BUN 12 6 - 20 mg/dL   Creatinine 0.73 0.44 - 1.00 mg/dL   Calcium 10.3 8.9 - 10.3 mg/dL   Total Protein 8.2 (H) 6.5 - 8.1 g/dL   Albumin 4.4 3.5 - 5.0 g/dL   AST 31 15 - 41 U/L   ALT 27 0 - 44 U/L   Alkaline Phosphatase 125 38 - 126 U/L   Total Bilirubin 0.6 0.3 - 1.2 mg/dL   GFR, Estimated >60 >60 mL/min    Comment: (NOTE) Calculated using the CKD-EPI Creatinine Equation (2021)    Anion gap 10 5 - 15    Comment: Performed at [facility], [place] [address] [place], [place] [address]  Genetic Screening Order     Status: None   Collection Time: 02/28/22  1:43 PM  Result Value Ref Range   Genetic Screening Order Collected by Laboratory     Comment: Performed at [facility] at [facility], [address], [place], [place] [address]  Urinalysis, Routine w reflex microscopic Urine, Clean Catch     Status: Abnormal   Collection Time: 03/05/22  5:24 AM  Result Value Ref Range   Color, Urine YELLOW YELLOW   APPearance CLEAR CLEAR   Specific Gravity, Urine 1.016 1.005 - 1.030   pH 6.0 5.0 - 8.0   Glucose, UA NEGATIVE NEGATIVE mg/dL   Hgb urine dipstick NEGATIVE  NEGATIVE   Bilirubin Urine NEGATIVE NEGATIVE   Ketones, ur NEGATIVE NEGATIVE mg/dL   Protein, ur NEGATIVE NEGATIVE mg/dL   Nitrite NEGATIVE NEGATIVE   Leukocytes,Ua TRACE (A) NEGATIVE   RBC / HPF 0-5 0 - 5 RBC/hpf   WBC, UA 0-5 0 - 5 WBC/hpf   Bacteria, UA RARE (A) NONE SEEN   Squamous Epithelial / HPF 6-10 0 - 5   Mucus PRESENT     Comment: Performed at [facility], [place] [address] [place], [place] [address]  Comprehensive metabolic panel     Status: Abnormal   Collection Time: 03/05/22  5:50 AM  Result Value Ref Range   Sodium 136 135 - 145 mmol/L  Potassium 3.5 3.5 - 5.1 mmol/L   Chloride 99 98 - 111 mmol/L   CO2 26 22 - 32 mmol/L   Glucose, Bld 124 (H) 70 - 99 mg/dL    Comment: Glucose reference range applies only to samples taken after fasting for at least 8 hours.   BUN 12 6 - 20 mg/dL   Creatinine, Ser 0.54 0.44 - 1.00 mg/dL   Calcium 10.0 8.9 - 10.3 mg/dL   Total Protein 8.7 (H) 6.5 - 8.1 g/dL   Albumin 4.0 3.5 - 5.0 g/dL   AST 30 15 - 41 U/L   ALT 29 0 - 44 U/L   Alkaline Phosphatase 125 38 - 126 U/L   Total Bilirubin 0.9 0.3 - 1.2 mg/dL   GFR, Estimated >60 >60 mL/min    Comment: (NOTE) Calculated using the CKD-EPI Creatinine Equation (2021)    Anion gap 11 5 - 15    Comment: Performed at [facility], [place] [address], [place], [place] [address]  CBC with Differential     Status: None   Collection Time: 03/05/22  5:50 AM  Result Value Ref Range   WBC 7.2 4.0 - 10.5 K/uL   RBC 3.95 3.87 - 5.11 MIL/uL   Hemoglobin 12.2 12.0 - 15.0 g/dL   HCT 36.7 36.0 - 46.0 %   MCV 92.9 80.0 - 100.0 fL   MCH 30.9 26.0 - 34.0 pg   MCHC 33.2 30.0 - 36.0 g/dL   RDW 12.4 11.5 - 15.5 %   Platelets 322 150 - 400 K/uL   nRBC 0.0 0.0 - 0.2 %   Neutrophils Relative % 56 %   Neutro Abs 4.1 1.7 - 7.7 K/uL   Lymphocytes Relative 31 %   Lymphs Abs 2.3 0.7 - 4.0 K/uL   Monocytes Relative 10 %   Monocytes Absolute 0.7 0.1 - 1.0 K/uL   Eosinophils  Relative 2 %   Eosinophils Absolute 0.1 0.0 - 0.5 K/uL   Basophils Relative 1 %   Basophils Absolute 0.1 0.0 - 0.1 K/uL   Immature Granulocytes 0 %   Abs Immature Granulocytes 0.01 0.00 - 0.07 K/uL    Comment: Performed at [facility], [place] [address], [place], [place] [address]  HIV Antibody (routine testing w rflx)     Status: None   Collection Time: 03/05/22  4:06 PM  Result Value Ref Range   HIV Screen 4th Generation wRfx Non Reactive Non Reactive    Comment: Performed at [facility], [place] [address], [place], [place] [address]  Comprehensive metabolic panel     Status: Abnormal   Collection Time: 03/06/22  5:23 AM  Result Value Ref Range   Sodium 137 135 - 145 mmol/L   Potassium 3.7 3.5 - 5.1 mmol/L   Chloride 103 98 - 111 mmol/L   CO2 24 22 - 32 mmol/L   Glucose, Bld 145 (H) 70 - 99 mg/dL    Comment: Glucose reference range applies only to samples taken after fasting for at least 8 hours.   BUN 16 6 - 20 mg/dL   Creatinine, Ser 0.58 0.44 - 1.00 mg/dL   Calcium 10.2 8.9 - 10.3 mg/dL   Total Protein 7.9 6.5 - 8.1 g/dL   Albumin 3.8 3.5 - 5.0 g/dL   AST 24 15 - 41 U/L   ALT 25 0 - 44 U/L   Alkaline Phosphatase 117 38 - 126 U/L   Total Bilirubin 0.6 0.3 - 1.2 mg/dL  GFR, Estimated >60 >60 mL/min    Comment: (NOTE) Calculated using the CKD-EPI Creatinine Equation (2021)    Anion gap 10 5 - 15    Comment: Performed at [facility], [place] [address], [place], [place] [address]  CBC     Status: Abnormal   Collection Time: 03/06/22  5:23 AM  Result Value Ref Range   WBC 11.6 (H) 4.0 - 10.5 K/uL   RBC 3.80 (L) 3.87 - 5.11 MIL/uL   Hemoglobin 11.9 (L) 12.0 - 15.0 g/dL   HCT 35.6 (L) 36.0 - 46.0 %   MCV 93.7 80.0 - 100.0 fL   MCH 31.3 26.0 - 34.0 pg   MCHC 33.4 30.0 - 36.0 g/dL   RDW 12.6 11.5 - 15.5 %   Platelets 329 150 - 400 K/uL   nRBC 0.0 0.0 - 0.2 %    Comment: Performed at [facility], [place] [address], [place], [place] [address]  Rad Sandria Senter Session Summary     Status: None   Collection Time: 03/06/22  4:51 PM  Result Value Ref Range   Course ID [email or web address]    Course Start Date 03/05/2022    Session Number 1    Course First Treatment Date 03/06/2022  3:49 PM    Course Last Treatment Date 03/06/2022  4:50 PM    Course Elapsed Days 0    Reference Point ID [id number] DP    Reference Point Dosage Given to Date 3 Gy   Reference Point Session Dosage Given 3 Gy   Reference Point ID [id number] DP    Reference Point Dosage Given to Date 3 Gy   Reference Point Session Dosage Given [id number] Gy   Plan ID [id number]    Plan Name simlspine    Plan Fractions Treated to Date 1    Plan Total Fractions Prescribed 10    Plan Prescribed Dose Per Fraction 3 Gy   Plan Total Prescribed Dose 30.000000 Gy   Plan Primary Reference Point [email or web address] DP    Plan ID [id number]    Plan Name simlowertspine    Plan Fractions Treated to Date 1    Plan Total Fractions Prescribed 10    Plan Prescribed Dose Per Fraction 3 Gy   Plan Total Prescribed Dose 30.000000 Gy   Plan Primary Reference [facility] [id number] DP   Rad Sandria Senter Session Summary     Status: None   Collection Time: 03/07/22 12:23 PM  Result Value Ref Range   Course ID [email or web address]    Course Start Date 03/05/2022    Session Number 2    Course First Treatment Date 03/06/2022  3:49 PM    Course Last Treatment Date 03/07/2022 12:22 PM    Course Elapsed Days 1    Reference Point ID [email or web address] DP    Reference Point Dosage Given to Date 6 Gy   Reference Point Session Dosage Given 3 Gy   Reference Point ID [email or web address] DP    Reference Point Dosage Given to Date 6 Gy   Reference Point Session Dosage Given 3 Gy   Reference Point ID [email or web address] DP    Reference Point Dosage Given to Date 6 Gy   Reference Point Session Dosage Given 3 Gy   Reference Point ID [id number] DP    Reference Point Dosage Given to Date 6 Gy   Reference Point Session Dosage  Given 3 Gy   Plan ID [email or web address]    Plan Name simcspine    Plan Fractions Treated  to Date 2    Plan Total Fractions Prescribed 10    Plan Prescribed Dose Per Fraction 3 Gy   Plan Total Prescribed Dose 30.000000 Gy   Plan Primary Reference Point Spine_[email or web address] DP    Plan ID Spine_L2-L4    Plan Name simlspine    Plan Fractions Treated to Date 2    Plan Total Fractions Prescribed 10    Plan Prescribed Dose Per Fraction 3 Gy   Plan Total Prescribed Dose 30.000000 Gy   Plan Primary Reference Point Spine_L2-L4 DP    Plan ID Spine_T1-T5    Plan Name simuppertspine    Plan Fractions Treated to Date 2    Plan Total Fractions Prescribed 10    Plan Prescribed Dose Per Fraction 3 Gy   Plan Total Prescribed Dose 30.000000 Gy   Plan Primary Reference Point Spine_T1-T5 DP    Plan ID [id number]    Plan Name simlowertspine    Plan Fractions Treated to Date 2    Plan Total Fractions Prescribed 10    Plan Prescribed Dose Per Fraction 3 Gy   Plan Total Prescribed Dose 30.000000 Gy   Plan Primary Reference Point Spine_T8-T11 DP   CBC with Differential/Platelet     Status: Abnormal   Collection Time: 03/08/22  6:12 AM  Result Value Ref Range   WBC 7.3 4.0 - 10.5 K/uL   RBC 3.67 (L) 3.87 - 5.11 MIL/uL   Hemoglobin 11.4 (L) 12.0 - 15.0 g/dL   HCT 35.1 (L) 36.0 - 46.0 %   MCV 95.6 80.0 - 100.0 fL   MCH 31.1 26.0 - 34.0 pg   MCHC 32.5 30.0 - 36.0 g/dL   RDW 13.0 11.5 - 15.5 %   Platelets 301 150 - 400 K/uL   nRBC 0.0 0.0 - 0.2 %   Neutrophils Relative % 88 %   Neutro Abs 6.5 1.7 - 7.7 K/uL   Lymphocytes Relative 6 %   Lymphs Abs 0.4 (L) 0.7 - 4.0 K/uL   Monocytes Relative 4 %   Monocytes Absolute 0.3 0.1 - 1.0 K/uL   Eosinophils Relative 0 %   Eosinophils Absolute 0.0 0.0 - 0.5 K/uL   Basophils Relative 0 %   Basophils Absolute 0.0 0.0 - 0.1 K/uL   Immature Granulocytes 2 %   Abs Immature Granulocytes 0.13 (H) 0.00 - 0.07 K/uL    Comment: Performed at [facility], [place] [address] [place],  [address]  Comprehensive metabolic panel     Status: Abnormal   Collection Time: 03/08/22  6:12 AM  Result Value Ref Range   Sodium 137 135 - 145 mmol/L   Potassium 3.9 3.5 - 5.1 mmol/L   Chloride 103 98 - 111 mmol/L   CO2 26 22 - 32 mmol/L   Glucose, Bld 155 (H) 70 - 99 mg/dL    Comment: Glucose reference range applies only to samples taken after fasting for at least 8 hours.   BUN 15 6 - 20 mg/dL   Creatinine, Ser 0.57 0.44 - 1.00 mg/dL   Calcium 8.8 (L) 8.9 - 10.3 mg/dL   Total Protein 7.6 6.5 - 8.1 g/dL   Albumin 3.5 3.5 - 5.0 g/dL   AST 37 15 - 41 U/L   ALT 31 0 - 44 U/L   Alkaline Phosphatase 134 (H) 38 - 126 U/L   Total Bilirubin 0.5 0.3 - 1.2 mg/dL   GFR, Estimated >60 >60 mL/min    Comment: (NOTE) Calculated using the CKD-EPI Creatinine  Equation (2021)    Anion gap 8 5 - 15    Comment: Performed at [facility], [place] [address], [place], [place] [address]  Magnesium     Status: Abnormal   Collection Time: 03/08/22  6:12 AM  Result Value Ref Range   Magnesium 2.5 (H) 1.7 - 2.4 mg/dL    Comment: Performed at [facility], [place] [address] [place], [place] [address]  Rad Sandria Senter Session Summary     Status: None   Collection Time: 03/08/22  9:23 AM  Result Value Ref Range   Course ID C1_Multi    Course Start Date 03/05/2022    Session Number 3    Course First Treatment Date 03/06/2022  3:49 PM    Course Last Treatment Date 03/08/2022  9:21 AM    Course Elapsed Days 2    Reference [place] ID Spine_C3-C6 DP    [place] Dosage Given to Date 9 Gy   [place] Session Dosage Given 3 Gy   Reference [place] ID Spine_L2-L4 DP    Reference [place] Dosage Given to Date 9 Gy   Reference [place] Session Dosage Given 3 Gy   Reference [place] ID Spine_T1-T5 DP    Reference [place] Dosage Given to Date 9 Gy   Reference [place] Session Dosage Given 3 Gy   Reference [place] ID Spine_T8-T11 DP    Reference [place] Dosage  Given to Date 9 Gy   [place] Session Dosage Given 3 Gy   Plan ID Spine_C3-C6    Plan Name simcspine    Plan Fractions Treated to Date 3    Plan Total Fractions Prescribed 10    Plan Prescribed Dose Per Fraction 3 Gy   Plan Total Prescribed Dose 30.000000 Gy   Plan Primary Reference [place] Spine_C3-C6 DP    Plan ID Spine_L2-L4    Plan Name simlspine    Plan Fractions Treated to Date 3    Plan Total Fractions Prescribed 10    Plan Prescribed Dose Per Fraction 3 Gy   Plan Total Prescribed Dose 30.000000 Gy   Plan Primary Reference [place] Spine_L2-L4 DP    Plan ID Spine_T1-T5    Plan Name simuppertspine    Plan Fractions Treated to Date 3    Plan Total Fractions Prescribed 10    Plan Prescribed Dose Per Fraction 3 Gy   Plan Total Prescribed Dose 30.000000 Gy   Plan Primary Reference [place] Spine_T1-T5 DP    Plan ID Spine_T8-T11    Plan Name simlowertspine    Plan Fractions Treated to Date 3    Plan Total Fractions Prescribed 10    Plan Prescribed Dose Per Fraction 3 Gy   Plan Total Prescribed Dose 30.000000 Gy   Plan Primary Reference [place] Spine_T8-T11 DP   Rad Sandria Senter Session Summary     Status: None   Collection Time: 03/09/22 12:54 PM  Result Value Ref Range   Course ID C1_Multi    Course Start Date 03/05/2022    Session Number 4    Course First Treatment Date 03/06/2022  3:49 PM    Course Last Treatment Date 03/09/2022 12:53 PM    Course Elapsed Days 3    Reference [place] ID Spine_L2-L4 DP    [place] Dosage Given to Date 12 Gy   [place] Session Dosage Given 3 Gy   Reference [place] ID Spine_T1-T5 DP    [place] Dosage Given to Date 12 Gy   [place] Session Dosage Given  3 Gy   Reference [place] ID Spine_T8-T11 DP    [place] Dosage Given to Date 12 Gy   [place] Session Dosage Given 3 Gy   Plan ID Spine_L2-L4    Plan Name simlspine    Plan Fractions Treated to Date 4    Plan Total Fractions Prescribed 10    Plan  Prescribed Dose Per Fraction 3 Gy   Plan Total Prescribed Dose 30.000000 Gy   Plan Primary Reference [place] Spine_L2-L4 DP    Plan ID Spine_T1-T5    Plan Name simuppertspine    Plan Fractions Treated to Date 4    Plan Total Fractions Prescribed 10    Plan Prescribed Dose Per Fraction 3 Gy   Plan Total Prescribed Dose 30.000000 Gy   Plan Primary Reference [place] Spine_T1-T5 DP    Plan ID Spine_T8-T11    Plan Name simlowertspine    Plan Fractions Treated to Date 4    Plan Total Fractions Prescribed 10    Plan Prescribed Dose Per Fraction 3 Gy   Plan Total Prescribed Dose 30.000000 Gy   Plan Primary Reference [place] Spine_T8-T11 DP   Rad Sandria Senter Session Summary     Status: None   Collection Time: 03/12/22 12:44 PM  Result Value Ref Range   Course ID C1_Multi    Course Start Date 03/05/2022    Session Number 5    Course First Treatment Date 03/06/2022  3:49 PM    Course Last Treatment Date 03/12/2022 12:43 PM    Course Elapsed Days 6    Reference [place] ID Spine_C3-C6 DP    [place] Dosage Given to Date 15 Gy   [place] Session Dosage Given 3 Gy   Reference [place] ID Spine_L2-L4 DP    [place] Dosage Given to Date 15 Gy   [place] Session Dosage Given 3 Gy   Reference [place] ID Spine_T1-T5 DP    [place] Dosage Given to Date 15 Gy   [place] Session Dosage Given 3 Gy   Reference [place] ID Spine_T8-T11 DP    [place] Dosage Given to Date 15 Gy   [place] Session Dosage Given 3 Gy   Plan ID Spine_C3-C6    Plan Name simcspine    Plan Fractions Treated to Date 5    Plan Total Fractions Prescribed 10    Plan Prescribed Dose Per Fraction 3 Gy   Plan Total Prescribed Dose 30.000000 Gy   Plan Primary Reference [place] Spine_C3-C6 DP    Plan ID Spine_L2-L4    Plan Name simlspine    Plan Fractions Treated to Date 5    Plan Total Fractions Prescribed 10    Plan Prescribed Dose Per Fraction 3 Gy   Plan Total Prescribed Dose 30.000000  Gy   Plan Primary Reference [place] Spine_L2-L4 DP    Plan ID Spine_T1-T5    Plan Name simuppertspine    Plan Fractions Treated to Date 5    Plan Total Fractions Prescribed 10    Plan Prescribed Dose Per Fraction 3 Gy   Plan Total Prescribed Dose 30.000000 Gy   Plan Primary Reference [place] Spine_T1-T5 DP    Plan ID Spine_T8-T11    Plan Name simlowertspine    Plan Fractions Treated to Date 5    Plan Total Fractions Prescribed 10    Plan Prescribed Dose Per Fraction 3 Gy   Plan Total Prescribed Dose 30.000000 Gy   Plan Primary Reference [place] Spine_T8-T11 DP   Rad Sandria Senter Session  Summary     Status: None   Collection Time: 03/13/22 12:18 PM  Result Value Ref Range   Course ID C1_Multi    Course Start Date 03/05/2022    Session Number 6    Course First Treatment Date 03/06/2022  3:49 PM    Course Last Treatment Date 03/13/2022 12:16 PM    Course Elapsed Days 7    Reference [place] ID Spine_C3-C6 DP    Reference [place] Dosage Given to Date 18 Gy   Reference [place] Session Dosage Given 3 Gy   Reference [place] ID Spine_L2-L4 DP    Reference [place] Dosage Given to Date 18 Gy   Reference [place] Session Dosage Given 3 Gy   Reference [place] ID Spine_T1-T5 DP    Reference [place] Dosage Given to Date 18 Gy   Reference [place] Session Dosage Given 3 Gy   Reference [place] ID Spine_T8-T11 DP    Reference [place] Dosage Given to Date 18 Gy   Reference [place] Session Dosage Given 3 Gy   Plan ID Spine_C3-C6    Plan Name simcspine    Plan Fractions Treated to Date 6    Plan Total Fractions Prescribed 10    Plan Prescribed Dose Per Fraction 3 Gy   Plan Total Prescribed Dose 30.000000 Gy   Plan Primary Reference [place] Spine_C3-C6 DP    Plan ID Spine_L2-L4    Plan Name simlspine    Plan Fractions Treated to Date 6    Plan Total Fractions Prescribed 10    Plan Prescribed Dose Per Fraction 3 Gy   Plan Total Prescribed Dose 30.000000 Gy   Plan Primary Reference [place] Spine_L2-L4 DP    Plan ID Spine_T1-T5     Plan Name simuppertspine    Plan Fractions Treated to Date 6    Plan Total Fractions Prescribed 10    Plan Prescribed Dose Per Fraction 3 Gy   Plan Total Prescribed Dose 30.000000 Gy   Plan Primary Reference [place] Spine_T1-T5 DP    Plan ID Spine_T8-T11    Plan Name simlowertspine    Plan Fractions Treated to Date 6    Plan Total Fractions Prescribed 10    Plan Prescribed Dose Per Fraction 3 Gy   Plan Total Prescribed Dose 30.000000 Gy   Plan Primary Reference [place] Spine_T8-T11 DP   Rad Sandria Senter Session Summary     Status: None   Collection Time: 03/14/22 11:48 AM  Result Value Ref Range   Course ID C1_Multi    Course Start Date 03/05/2022    Session Number 7    Course First Treatment Date 03/06/2022  3:49 PM    Course Last Treatment Date 03/14/2022 11:47 AM    Course Elapsed Days 8    Reference [place] ID Spine_C3-C6 DP    [place] Dosage Given to Date 21 Gy   Reference [place] Session Dosage Given 3 Gy   Reference [place] ID Spine_L2-L4 DP    Reference [place] Dosage Given to Date 21 Gy   [place] Session Dosage Given 3 Gy   Reference [place] ID Spine_T1-T5 DP    [place] Dosage Given to Date 21 Gy   [place] Session Dosage Given 3 Gy   Reference [place] ID Spine_T8-T11 DP    [place] Dosage Given to Date 21 Gy   Reference [place] Session Dosage Given 3 Gy   Plan ID Spine_C3-C6    Plan Name simcspine    Plan Fractions Treated to Date 7    Plan Total Fractions  Prescribed 10    Plan Prescribed Dose Per Fraction 3 Gy   Plan Total Prescribed Dose 30.000000 Gy   Plan Primary Reference [place] Spine_C3-C6 DP    Plan ID Spine_L2-L4    Plan Name simlspine    Plan Fractions Treated to Date 7    Plan Total Fractions Prescribed 10    Plan Prescribed Dose Per Fraction 3 Gy   Plan Total Prescribed Dose 30.000000 Gy   Plan Primary Reference [place] Spine_L2-L4 DP    Plan ID Spine_T1-T5    Plan Name simuppertspine    Plan Fractions Treated to Date 7    Plan  Total Fractions Prescribed 10    Plan Prescribed Dose Per Fraction 3 Gy   Plan Total Prescribed Dose 30.000000 Gy   Plan Primary Reference [place] Spine_T1-T5 DP    Plan ID Spine_T8-T11    Plan Name simlowertspine    Plan Fractions Treated to Date 7    Plan Total Fractions Prescribed 10    Plan Prescribed Dose Per Fraction 3 Gy   Plan Total Prescribed Dose 30.000000 Gy   Plan Primary Reference [place] Spine_T8-T11 DP   Rad Sandria Senter Session Summary     Status: None   Collection Time: 03/19/22 12:02 PM  Result Value Ref Range   Course ID C1_Multi    Course Start Date 03/05/2022    Session Number 8    Course First Treatment Date 03/06/2022  3:49 PM    Course Last Treatment Date 03/19/2022 12:01 PM    Course Elapsed Days 13    Reference [place] ID Spine_C3-C6 DP    [place] Dosage Given to Date 24 Gy   Reference [place] Session Dosage Given 3 Gy   Reference [place] ID Spine_L2-L4 DP    [place] Dosage Given to Date 24 Gy   [place] Session Dosage Given 3 Gy   Reference [place] ID Spine_T1-T5 DP    [place] Dosage Given to Date 24 Gy   [place] Session Dosage Given 3 Gy   Reference [place] ID Spine_T8-T11 DP    [place] Dosage Given to Date 24 Gy   Reference [place] Session Dosage Given 3 Gy   Plan ID Spine_C3-C6    Plan Name simcspine    Plan Fractions Treated to Date 8    Plan Total Fractions Prescribed 10    Plan Prescribed Dose Per Fraction 3 Gy   Plan Total Prescribed Dose 30.000000 Gy   Plan Primary Reference [place] Spine_C3-C6 DP    Plan ID Spine_L2-L4    Plan Name simlspine    Plan Fractions Treated to Date 8    Plan Total Fractions Prescribed 10    Plan Prescribed Dose Per Fraction 3 Gy   Plan Total Prescribed Dose 30.000000 Gy   Plan Primary Reference [place] Spine_L2-L4 DP    Plan ID Spine_T1-T5    Plan Name simuppertspine    Plan Fractions Treated to Date 8    Plan Total Fractions Prescribed 10    Plan Prescribed Dose Per Fraction 3 Gy    Plan Total Prescribed Dose 30.000000 Gy   Plan Primary Reference [place] Spine_T1-T5 DP    Plan ID Spine_T8-T11    Plan Name simlowertspine    Plan Fractions Treated to Date 8    Plan Total Fractions Prescribed 10    Plan Prescribed Dose Per Fraction 3 Gy   Plan Total Prescribed Dose 30.000000 Gy   Plan Primary Reference [place] Spine_T8-T11 DP   Rad Sandria Senter Session  Summary     Status: None   Collection Time: 03/20/22 12:34 PM  Result Value Ref Range   Course ID C1_Multi    Course Start Date 03/05/2022    Session Number 9    Course First Treatment Date 03/06/2022  3:49 PM    Course Last Treatment Date 03/20/2022 12:33 PM    Course Elapsed Days 14    Reference [place] ID Spine_C3-C6 DP    Reference [place] Dosage Given to Date 27 Gy   Reference [place] Session Dosage Given 3 Gy   Reference [place] ID Spine_L2-L4 DP    Reference [place] Dosage Given to Date 27 Gy   Reference [place] Session Dosage Given 3 Gy   Reference [place] ID Spine_T1-T5 DP    Reference [place] Dosage Given to Date 27 Gy   Reference [place] Session Dosage Given 3 Gy   Reference [place] ID Spine_T8-T11 DP    Reference [place] Dosage Given to Date 27 Gy   Reference [place] Session Dosage Given 3 Gy   Plan ID Spine_C3-C6    Plan Name simcspine    Plan Fractions Treated to Date 9    Plan Total Fractions Prescribed 10    Plan Prescribed Dose Per Fraction 3 Gy   Plan Total Prescribed Dose 30.000000 Gy   Plan Primary Reference [place] Spine_C3-C6 DP    Plan ID Spine_L2-L4    Plan Name simlspine    Plan Fractions Treated to Date 9    Plan Total Fractions Prescribed 10    Plan Prescribed Dose Per Fraction 3 Gy   Plan Total Prescribed Dose 30.000000 Gy   Plan Primary Reference [place] Spine_L2-L4 DP    Plan ID Spine_T1-T5    Plan Name simuppertspine    Plan Fractions Treated to Date 9    Plan Total Fractions Prescribed 10    Plan Prescribed Dose Per Fraction 3 Gy   Plan Total Prescribed Dose 30.000000 Gy   Plan Primary Reference [place]  Spine_T1-T5 DP    Plan ID Spine_T8-T11    Plan Name simlowertspine    Plan Fractions Treated to Date 9    Plan Total Fractions Prescribed 10    Plan Prescribed Dose Per Fraction 3 Gy   Plan Total Prescribed Dose 30.000000 Gy   Plan Primary Reference [place] Spine_T8-T11 DP   Rad Sandria Senter Session Summary     Status: None   Collection Time: 03/21/22 12:02 PM  Result Value Ref Range   Course ID C1_Multi    Course Start Date 03/05/2022    Session Number 10    Course First Treatment Date 03/06/2022  3:49 PM    Course Last Treatment Date 03/21/2022 12:00 PM    Course Elapsed Days 15    Reference [place] ID Spine_C3-C6 DP    [place] Dosage Given to Date 30 Gy   Reference [place] Session Dosage Given 3 Gy   Reference [place] ID Spine_L2-L4 DP    Reference [place] Dosage Given to Date 30 Gy   Reference [place] Session Dosage Given 3 Gy   Reference [place] ID Spine_T1-T5 DP    Reference [place] Dosage Given to Date 30 Gy   Reference [place] Session Dosage Given 3 Gy   Reference [place] ID Spine_T8-T11 DP    Reference [place] Dosage Given to Date 30 Gy   Reference [place] Session Dosage Given 3 Gy   Plan ID Spine_C3-C6    Plan Name simcspine    Plan Fractions Treated to Date 10    Plan Total Fractions  Prescribed 10    Plan Prescribed Dose Per Fraction 3 Gy   Plan Total Prescribed Dose 30.000000 Gy   Plan Primary Reference [place] Spine_C3-C6 DP    Plan ID Spine_L2-L4    Plan Name simlspine    Plan Fractions Treated to Date 10    Plan Total Fractions Prescribed 10    Plan Prescribed Dose Per Fraction 3 Gy   Plan Total Prescribed Dose 30.000000 Gy   Plan Primary Reference [place] Spine_L2-L4 DP    Plan ID Spine_T1-T5    Plan Name simuppertspine    Plan Fractions Treated to Date 10    Plan Total Fractions Prescribed 10    Plan Prescribed Dose Per Fraction 3 Gy   Plan Total Prescribed Dose 30.000000 Gy   Plan Primary Reference [place] Spine_T1-T5 DP    Plan ID Spine_T8-T11    Plan Name simlowertspine     Plan Fractions Treated to Date 10    Plan Total Fractions Prescribed 10    Plan Prescribed Dose Per Fraction 3 Gy   Plan Total Prescribed Dose 30.000000 Gy   Plan Primary Reference [place] Spine_T8-T11 DP   Rad Sandria Senter Session Summary     Status: None   Collection Time: 03/23/22  3:11 PM  Result Value Ref Range   Course ID C1_Multi    Course Start Date 03/05/2022    Session Number 11    Course First Treatment Date 03/06/2022  3:49 PM    Course Last Treatment Date 03/23/2022  3:10 PM    Course Elapsed Days 17    Reference [place] ID Spine_L2-L4 DP    Reference [place] Dosage Given to Date 33 Gy   Reference [place] Session Dosage Given 3 Gy   Plan ID Spine_L2-L4:1    Plan Name simlspine    Plan Fractions Treated to Date 1    Plan Total Fractions Prescribed 1    Plan Prescribed Dose Per Fraction 3 Gy   Plan Total Prescribed Dose 3.000000 Gy   Plan Primary Reference [place] Spine_L2-L4 DP   Cancer antigen 19-9     Status: Abnormal   Collection Time: 03/26/22  8:02 AM  Result Value Ref Range   CA 19-9 199 (H) 0 - 35 U/mL    Comment: (NOTE) Roche [employer]ostics Electrochemiluminescence Immunoassay (ECLIA) Values obtained with different assay methods or kits cannot be used interchangeably.  Results cannot be interpreted as absolute evidence of the presence or absence of malignant disease. Performed At: [facility] [place], [place] [id number] Rush Farmer MD WU:[id number]   CBC with Differential ([place] Only)     Status: Abnormal   Collection Time: 03/26/22  8:02 AM  Result Value Ref Range   WBC Count 4.5 4.0 - 10.5 K/uL   RBC 3.17 (L) 3.87 - 5.11 MIL/uL   Hemoglobin 10.3 (L) 12.0 - 15.0 g/dL   HCT 30.6 (L) 36.0 - 46.0 %   MCV 96.5 80.0 - 100.0 fL   MCH 32.5 26.0 - 34.0 pg   MCHC 33.7 30.0 - 36.0 g/dL   RDW 14.4 11.5 - 15.5 %   Platelet Count 98 (L) 150 - 400 K/uL   nRBC 0.0 0.0 - 0.2 %   Neutrophils Relative % 87 %   Neutro Abs 3.9 1.7 - 7.7 K/uL    Lymphocytes Relative 5 %   Lymphs Abs 0.2 (L) 0.7 - 4.0 K/uL   Monocytes Relative 7 %   Monocytes Absolute 0.3 0.1 - 1.0 K/uL   Eosinophils  Relative 1 %   Eosinophils Absolute 0.1 0.0 - 0.5 K/uL   Basophils Relative 0 %   Basophils Absolute 0.0 0.0 - 0.1 K/uL   Immature Granulocytes 0 %   Abs Immature Granulocytes 0.02 0.00 - 0.07 K/uL    Comment: Performed at [facility] Lab at [facility], [address], [place], [place] [address]  CMP ([place] only)     Status: Abnormal   Collection Time: 03/26/22  8:02 AM  Result Value Ref Range   Sodium 141 135 - 145 mmol/L   Potassium 3.4 (L) 3.5 - 5.1 mmol/L   Chloride 105 98 - 111 mmol/L   CO2 29 22 - 32 mmol/L   Glucose, Bld 104 (H) 70 - 99 mg/dL    Comment: Glucose reference range applies only to samples taken after fasting for at least 8 hours.   BUN 16 6 - 20 mg/dL   Creatinine 0.53 0.44 - 1.00 mg/dL   Calcium 9.1 8.9 - 10.3 mg/dL   Total Protein 6.5 6.5 - 8.1 g/dL   Albumin 3.7 3.5 - 5.0 g/dL   AST 17 15 - 41 U/L   ALT 34 0 - 44 U/L   Alkaline Phosphatase 81 38 - 126 U/L   Total Bilirubin 0.7 0.3 - 1.2 mg/dL   GFR, Estimated >60 >60 mL/min    Comment: (NOTE) Calculated using the CKD-EPI Creatinine Equation (2021)    Anion gap 7 5 - 15    Comment: Performed at [facility] Lab at [facility], [address], [place], [place] [address]  Lactate dehydrogenase     Status: Abnormal   Collection Time: 03/26/22  8:03 AM  Result Value Ref Range   LDH 237 (H) 98 - 192 U/L    Comment: Performed at [facility] at [facility], [place], [place] [address]  Prealbumin     Status: None   Collection Time: 03/26/22  8:03 AM  Result Value Ref Range   Prealbumin 19 18 - 38 mg/dL    Comment: Performed at [place] [address] [place],  [address]  CBC with Differential ([place] Only)     Status: Abnormal   Collection Time:  04/02/22  8:42 AM  Result Value Ref Range   WBC Count 2.7 (L) 4.0 - 10.5 K/uL   RBC 3.02 (L) 3.87 - 5.11 MIL/uL   Hemoglobin 9.6 (L) 12.0 - 15.0 g/dL   HCT 29.1 (L) 36.0 - 46.0 %   MCV 96.4 80.0 - 100.0 fL   MCH 31.8 26.0 - 34.0 pg   MCHC 33.0 30.0 - 36.0 g/dL   RDW 14.7 11.5 - 15.5 %   Platelet Count 156 150 - 400 K/uL   nRBC 0.0 0.0 - 0.2 %   Neutrophils Relative % 72 %   Neutro Abs 2.0 1.7 - 7.7 K/uL   Lymphocytes Relative 11 %   Lymphs Abs 0.3 (L) 0.7 - 4.0 K/uL   Monocytes Relative 11 %   Monocytes Absolute 0.3 0.1 - 1.0 K/uL   Eosinophils Relative 4 %   Eosinophils Absolute 0.1 0.0 - 0.5 K/uL   Basophils Relative 1 %   Basophils Absolute 0.0 0.0 - 0.1 K/uL   Immature Granulocytes 1 %   Abs Immature Granulocytes 0.02 0.00 - 0.07 K/uL    Comment: Performed at [facility] Lab at [facility], [address], [place], [place] [address]  CMP ([place] only)     Status: Abnormal   Collection Time: 04/02/22  8:42 AM  Result Value Ref Range   Sodium 141 135 - 145 mmol/L   Potassium 2.9 (L) 3.5 - 5.1 mmol/L   Chloride 104 98 - 111 mmol/L   CO2 28 22 - 32 mmol/L   Glucose, Bld 105 (H) 70 - 99 mg/dL    Comment: Glucose reference range applies only to samples taken after fasting for at least 8 hours.   BUN 11 6 - 20 mg/dL   Creatinine 0.49 0.44 - 1.00 mg/dL   Calcium 9.0 8.9 - 10.3 mg/dL   Total Protein 6.4 (L) 6.5 - 8.1 g/dL   Albumin 3.6 3.5 - 5.0 g/dL   AST 21 15 - 41 U/L   ALT 22 0 - 44 U/L   Alkaline Phosphatase 112 38 - 126 U/L   Total Bilirubin 0.8 0.3 - 1.2 mg/dL   GFR, Estimated >60 >60 mL/min    Comment: (NOTE) Calculated using the CKD-EPI Creatinine Equation (2021)    Anion gap 9 5 - 15    Comment: Performed at [facility] Lab at [facility], [place], [place] [address]  Cancer antigen 19-9     Status: Abnormal   Collection Time: 04/02/22  8:42 AM  Result Value Ref Range   CA 19-9 216 (H) 0 -  35 U/mL    Comment: (NOTE) Roche [employer] Electrochemiluminescence Immunoassay (ECLIA) Values obtained with different assay methods or kits cannot be used interchangeably.  Results cannot be interpreted as absolute evidence of the presence or absence of malignant disease. Performed At: [facility] [place], [place] [id number] Rush Farmer MD UR:[id number]   Lactate dehydrogenase     Status: Abnormal   Collection Time: 04/02/22  8:44 AM  Result Value Ref Range   LDH 291 (H) 98 - 192 U/L    Comment: Performed at [facility] Lab at [facility], [address] [place], [place] [address]  CBC with Differential ([place] Only)     Status: Abnormal   Collection Time: 04/18/22  8:20 AM  Result Value Ref Range   WBC Count 6.4 4.0 - 10.5 K/uL   RBC 2.63 (L) 3.87 - 5.11 MIL/uL   Hemoglobin 8.6 (L) 12.0 - 15.0 g/dL   HCT 26.0 (L) 36.0 - 46.0 %   MCV 98.9 80.0 - 100.0 fL   MCH 32.7 26.0 - 34.0 pg   MCHC 33.1 30.0 - 36.0 g/dL   RDW 16.8 (H) 11.5 - 15.5 %   Platelet Count 154 150 - 400 K/uL   nRBC 0.6 (H) 0.0 - 0.2 %   Neutrophils Relative % 84 %   Neutro Abs 5.3 1.7 - 7.7 K/uL   Lymphocytes Relative 7 %   Lymphs Abs 0.5 (L) 0.7 - 4.0 K/uL   Monocytes Relative 8 %   Monocytes Absolute 0.5 0.1 - 1.0 K/uL   Eosinophils Relative 0 %   Eosinophils Absolute 0.0 0.0 - 0.5 K/uL   Basophils Relative 0 %   Basophils Absolute 0.0 0.0 - 0.1 K/uL   Immature Granulocytes 1 %   Abs Immature Granulocytes 0.09 (H) 0.00 - 0.07 K/uL    Comment: Performed at [facility] at [facility], [address] [place], [place] [address]  CMP ([place] only)  Status: Abnormal   Collection Time: 04/18/22  8:20 AM  Result Value Ref Range   Sodium 142 135 - 145 mmol/L   Potassium 2.7 (LL) 3.5 - 5.1 mmol/L    Comment: REPEATED TO VERIFY CRITICAL RESULT CALLED TO, READ BACK BY AND VERIFIED WITH: VANESSA T. RN AT 2751 ON  [id number] BY SROY     Chloride 104 98 - 111 mmol/L   CO2 28 22 - 32 mmol/L   Glucose, Bld 120 (H) 70 - 99 mg/dL    Comment: Glucose reference range applies only to samples taken after fasting for at least 8 hours.   BUN 10 6 - 20 mg/dL   Creatinine 0.56 0.44 - 1.00 mg/dL   Calcium 8.4 (L) 8.9 - 10.3 mg/dL   Total Protein 5.8 (L) 6.5 - 8.1 g/dL   Albumin 3.6 3.5 - 5.0 g/dL   AST 20 15 - 41 U/L   ALT 20 0 - 44 U/L   Alkaline Phosphatase 144 (H) 38 - 126 U/L   Total Bilirubin 0.5 0.3 - 1.2 mg/dL   GFR, Estimated >60 >60 mL/min    Comment: (NOTE) Calculated using the CKD-EPI Creatinine Equation (2021)    Anion gap 10 5 - 15    Comment: Performed at [facility] at [facility], [address], [place], [place] [address]  Cancer antigen 19-9     Status: Abnormal   Collection Time: 04/18/22  8:20 AM  Result Value Ref Range   CA 19-9 236 (H) 0 - 35 U/mL    Comment: (NOTE) Roche Diagnostics Electrochemiluminescence Immunoassay (ECLIA) Values obtained with different assay methods or kits cannot be used interchangeably.  Results cannot be interpreted as absolute evidence of the presence or absence of malignant disease. Performed At: [facility] [place], [place] [id number] Rush Farmer MD [id number]   Lactate dehydrogenase     Status: Abnormal   Collection Time: 04/18/22  8:21 AM  Result Value Ref Range   LDH 234 (H) 98 - 192 U/L    Comment: Performed at [facility] at [facility], [address] [place], [place] [address]  Prepare RBC (crossmatch)     Status: None   Collection Time: 04/18/22  8:45 AM  Result Value Ref Range   Order Confirmation      ORDER PROCESSED BY BLOOD BANK Performed at [facility], [place] [address], [place], [place] [address]   Type and screen     Status: None   Collection Time: 04/18/22  8:58 AM  Result Value Ref Range   ABO/RH(D) A NEG    Antibody Screen  NEG    Sample Expiration 04/21/2022,2359    Unit Number [id number]    Blood Component Type RED CELLS,LR    Unit division 00    Status of Unit ISSUED,FINAL    Transfusion Status OK TO TRANSFUSE    Crossmatch Result Compatible    Unit Number [id number]    Blood Component Type RED CELLS,LR    Unit division 00    Status of Unit ISSUED,FINAL    Transfusion Status OK TO TRANSFUSE    Crossmatch Result      Compatible Performed at [facility], [place] [address], [place], [place] [address]   BPAM RBC     Status: None   Collection Time: 04/18/22  8:58 AM  Result Value Ref Range   ISSUE DATE / TIME [id number]  Blood Product Unit Number [id number]    PRODUCT CODE [id number]    Unit Type and Rh 0600    Blood Product Expiration Date 010932355732    ISSUE DATE / TIME 202542706237    Blood Product Unit Number [id number]    PRODUCT CODE [id number]    Unit Type and Rh 0600    Blood Product Expiration Date 485462703500   Cancer antigen 19-9     Status: Abnormal   Collection Time: 05/02/22  8:28 AM  Result Value Ref Range   CA 19-9 159 (H) 0 - 35 U/mL    Comment: (NOTE) Roche Diagnostics Electrochemiluminescence Immunoassay (ECLIA) Values obtained with different assay methods or kits cannot be used interchangeably.  Results cannot be interpreted as absolute evidence of the presence or absence of malignant disease. Performed At: [facility] [place], [place] [id number] Rush Farmer MD [id number]   CBC with Differential ([place] Only)     Status: Abnormal   Collection Time: 05/02/22  8:28 AM  Result Value Ref Range   WBC Count 2.7 (L) 4.0 - 10.5 K/uL   RBC 3.53 (L) 3.87 - 5.11 MIL/uL   Hemoglobin 11.0 (L) 12.0 - 15.0 g/dL   HCT 33.5 (L) 36.0 - 46.0 %   MCV 94.9 80.0 - 100.0 fL   MCH 31.2 26.0 - 34.0 pg   MCHC 32.8 30.0 - 36.0 g/dL   RDW 17.0 (H) 11.5 - 15.5 %   Platelet Count 202 150 - 400 K/uL   nRBC 0.0 0.0 - 0.2 %    Neutrophils Relative % 70 %   Neutro Abs 1.9 1.7 - 7.7 K/uL   Lymphocytes Relative 12 %   Lymphs Abs 0.3 (L) 0.7 - 4.0 K/uL   Monocytes Relative 15 %   Monocytes Absolute 0.4 0.1 - 1.0 K/uL   Eosinophils Relative 0 %   Eosinophils Absolute 0.0 0.0 - 0.5 K/uL   Basophils Relative 0 %   Basophils Absolute 0.0 0.0 - 0.1 K/uL   Immature Granulocytes 3 %   Abs Immature Granulocytes 0.07 0.00 - 0.07 K/uL    Comment: Performed at [facility] at [facility], [address] [place], [place] [address]  CMP ([place] only)     Status: Abnormal   Collection Time: 05/02/22  8:28 AM  Result Value Ref Range   Sodium 137 135 - 145 mmol/L   Potassium 2.8 (L) 3.5 - 5.1 mmol/L   Chloride 107 98 - 111 mmol/L   CO2 22 22 - 32 mmol/L   Glucose, Bld 113 (H) 70 - 99 mg/dL    Comment: Glucose reference range applies only to samples taken after fasting for at least 8 hours.   BUN 7 6 - 20 mg/dL   Creatinine 0.54 0.44 - 1.00 mg/dL   Calcium 8.6 (L) 8.9 - 10.3 mg/dL   Total Protein 6.4 (L) 6.5 - 8.1 g/dL   Albumin 3.0 (L) 3.5 - 5.0 g/dL   AST 44 (H) 15 - 41 U/L   ALT 36 0 - 44 U/L   Alkaline Phosphatase 185 (H) 38 - 126 U/L   Total Bilirubin 0.3 0.3 - 1.2 mg/dL   GFR, Estimated >60 >60 mL/min    Comment: (NOTE) Calculated using the CKD-EPI Creatinine Equation (2021)    Anion gap 8 5 - 15    Comment: Performed at [facility], [place] [place], [place] [address]  Lactate dehydrogenase  Status: Abnormal   Collection Time: 05/02/22  8:31 AM  Result Value Ref Range   LDH 340 (H) 98 - 192 U/L    Comment: Performed at [facility], [place] [address], [place], [place] [address]  Sample to Blood Bank     Status: None   Collection Time: 05/02/22  8:32 AM  Result Value Ref Range   Blood Bank Specimen SAMPLE AVAILABLE FOR TESTING    Sample Expiration      05/05/2022,2359 Performed at [facility], [place] [address], [place], [place] [address]   Comprehensive metabolic panel     Status: Abnormal   Collection Time: 05/06/22  4:30 AM  Result Value Ref Range   Sodium 140 135 - 145 mmol/L   Potassium 3.2 (L) 3.5 - 5.1 mmol/L   Chloride 109 98 - 111 mmol/L   CO2 20 (L) 22 - 32 mmol/L   Glucose, Bld 115 (H) 70 - 99 mg/dL    Comment: Glucose reference range applies only to samples taken after fasting for at least 8 hours.   BUN 6 6 - 20 mg/dL   Creatinine, Ser 0.57 0.44 - 1.00 mg/dL   Calcium 8.4 (L) 8.9 - 10.3 mg/dL   Total Protein 6.0 (L) 6.5 - 8.1 g/dL   Albumin 2.8 (L) 3.5 - 5.0 g/dL   AST 25 15 - 41 U/L   ALT 26 0 - 44 U/L   Alkaline Phosphatase 219 (H) 38 - 126 U/L   Total Bilirubin 0.7 0.3 - 1.2 mg/dL   GFR, Estimated >60 >60 mL/min    Comment: (NOTE) Calculated using the CKD-EPI Creatinine Equation (2021)    Anion gap 11 5 - 15    Comment: Performed at [facility], [place] [address], [place], [place] [address]  Lactic acid, plasma     Status: None   Collection Time: 05/06/22  4:30 AM  Result Value Ref Range   Lactic Acid, Venous 1.3 0.5 - 1.9 mmol/L    Comment: Performed at [facility], [place] [address] [place], [place] [address]  CBC with Differential     Status: Abnormal   Collection Time: 05/06/22  4:30 AM  Result Value Ref Range   WBC 12.5 (H) 4.0 - 10.5 K/uL   RBC 3.72 (L) 3.87 - 5.11 MIL/uL   Hemoglobin 11.7 (L) 12.0 - 15.0 g/dL   HCT 36.2 36.0 - 46.0 %   MCV 97.3 80.0 - 100.0 fL   MCH 31.5 26.0 - 34.0 pg   MCHC 32.3 30.0 - 36.0 g/dL   RDW 18.0 (H) 11.5 - 15.5 %   Platelets 184 150 - 400 K/uL   nRBC 0.2 0.0 - 0.2 %   Neutrophils Relative % 82 %   Neutro Abs 10.3 (H) 1.7 - 7.7 K/uL   Lymphocytes Relative 4 %   Lymphs Abs 0.4 (L) 0.7 - 4.0 K/uL   Monocytes Relative 12 %   Monocytes Absolute 1.5 (H) 0.1 - 1.0 K/uL   Eosinophils Relative 0 %   Eosinophils Absolute 0.0 0.0 - 0.5 K/uL   Basophils Relative 0 %   Basophils Absolute 0.0 0.0 -  0.1 K/uL   Immature Granulocytes 2 %   Abs Immature Granulocytes 0.20 (H) 0.00 - 0.07 K/uL    Comment: Performed at [facility], [place] [address], [place], [place] [address]  Protime-INR     Status: Abnormal   Collection Time: 05/06/22  4:30 AM  Result Value Ref Range   Prothrombin Time 15.6 (H) 11.4 - 15.2 seconds   INR 1.3 (H) 0.8 - 1.2    Comment: (NOTE) INR goal varies based on device and disease states. Performed at [facility], [place] [address] [place], [place] [address]   Culture, blood (Routine x 2)     Status: None   Collection Time: 05/06/22  4:30 AM   Specimen: BLOOD  Result Value Ref Range   Specimen Description      BLOOD RIGHT ANTECUBITAL Performed at [place] [address] [place], [place] [address]    Special Requests      BOTTLES DRAWN AEROBIC AND ANAEROBIC Blood Culture adequate volume Performed at [place] [address] [place], [place] [address]    Culture      NO GROWTH 5 DAYS Performed at [facility], [place] [address] [place], [place] [address]    Report Status 05/11/2022 FINAL   Troponin I (High Sensitivity)     Status: None   Collection Time: 05/06/22  4:30 AM  Result Value Ref Range   Troponin I (High Sensitivity) 17 <18 ng/L    Comment: (NOTE) Elevated high sensitivity troponin I (hsTnI) values and significant  changes across serial measurements may suggest ACS but many other  chronic and acute conditions are known to elevate hsTnI results.  Refer to the "Links" section for chest pain algorithms and additional  guidance. Performed at [facility], [place] [address], [place], [place] [address]   Troponin I (High Sensitivity)     Status: Abnormal   Collection Time: 05/06/22  6:33 AM  Result Value Ref Range   Troponin I (High Sensitivity) 18 (H) <18 ng/L    Comment: (NOTE) Elevated high sensitivity troponin I (hsTnI) values and  significant  changes across serial measurements may suggest ACS but many other  chronic and acute conditions are known to elevate hsTnI results.  Refer to the "Links" section for chest pain algorithms and additional  guidance. Performed at [facility], [place] [address] [place], [place] [address]   Magnesium     Status: Abnormal   Collection Time: 05/06/22  6:33 AM  Result Value Ref Range   Magnesium 1.5 (L) 1.7 - 2.4 mg/dL    Comment: Performed at [facility], [place] [address] [place], [place] [address]  Phosphorus     Status: None   Collection Time: 05/06/22  6:33 AM  Result Value Ref Range   Phosphorus 2.9 2.5 - 4.6 mg/dL    Comment: Performed at [facility], [place] [address] [place], [place] [address]  Procalcitonin - Baseline     Status: None   Collection Time: 05/06/22  6:33 AM  Result Value Ref Range   Procalcitonin <0.10 ng/mL    Comment:        Interpretation: PCT (Procalcitonin) <= 0.5 ng/mL: Systemic infection (sepsis) is not likely. Local bacterial infection is possible. (NOTE)       Sepsis PCT Algorithm           Lower Respiratory Tract                                      Infection PCT Algorithm    ----------------------------     ----------------------------         PCT < 0.25 ng/mL  PCT < 0.10 ng/mL          Strongly encourage             Strongly discourage   discontinuation of antibiotics    initiation of antibiotics    ----------------------------     -----------------------------       PCT 0.25 - 0.50 ng/mL            PCT 0.10 - 0.25 ng/mL               OR       >80% decrease in PCT            Discourage initiation of                                            antibiotics      Encourage discontinuation           of antibiotics    ----------------------------     -----------------------------         PCT >= 0.50 ng/mL              PCT 0.26 - 0.50 ng/mL               AND        <80%  decrease in PCT             Encourage initiation of                                             antibiotics       Encourage continuation           of antibiotics    ----------------------------     -----------------------------        PCT >= 0.50 ng/mL                  PCT > 0.50 ng/mL               AND         increase in PCT                  Strongly encourage                                      initiation of antibiotics    Strongly encourage escalation           of antibiotics                                     -----------------------------                                           PCT <= 0.25 ng/mL                                                 OR                                        >  80% decrease in PCT                                      Discontinue / Do not initiate                                             antibiotics  Performed at [place] [address], [place], [place] [address]   Resp panel by RT-PCR (RSV, Flu A&B, Covid) Anterior Nasal Swab     Status: None   Collection Time: 05/06/22  6:48 AM   Specimen: Anterior Nasal Swab  Result Value Ref Range   SARS Coronavirus 2 by RT PCR NEGATIVE NEGATIVE    Comment: (NOTE) SARS-CoV-2 target nucleic acids are NOT DETECTED.  The SARS-CoV-2 RNA is generally detectable in upper respiratory specimens during the acute phase of infection. The lowest concentration of SARS-CoV-2 viral copies this assay can detect is 138 copies/mL. A negative result does not preclude SARS-Cov-2 infection and should not be used as the sole basis for treatment or other patient management decisions. A negative result may occur with  improper specimen collection/handling, submission of specimen other than nasopharyngeal swab, presence of viral mutation(s) within the areas targeted by this assay, and inadequate number of viral copies(<138 copies/mL). A negative result must be combined with clinical observations, patient  history, and epidemiological information. The expected result is Negative.  Fact Sheet for Patients:  EntrepreneurPulse.com.au  Fact Sheet for Healthcare Providers:  IncredibleEmployment.be  This test is no t yet approved or cleared by the [place] FDA and  has been authorized for detection and/or diagnosis of SARS-CoV-2 by FDA under an Emergency Use Authorization (EUA). This EUA will remain  in effect (meaning this test can be used) for the duration of the COVID-19 declaration under Section 564(b)(1) of the Act, 21 U.S.C.section 360bbb-3(b)(1), unless the authorization is terminated  or revoked sooner.       Influenza A by PCR NEGATIVE NEGATIVE   Influenza B by PCR NEGATIVE NEGATIVE    Comment: (NOTE) The Xpert Xpress SARS-CoV-2/FLU/RSV plus assay is intended as an aid in the diagnosis of influenza from Nasopharyngeal swab specimens and should not be used as a sole basis for treatment. Nasal washings and aspirates are unacceptable for Xpert Xpress SARS-CoV-2/FLU/RSV testing.  Fact Sheet for Patients: EntrepreneurPulse.com.au  Fact Sheet for Healthcare Providers: IncredibleEmployment.be  This test is not yet approved or cleared by the [place] FDA and has been authorized for detection and/or diagnosis of SARS-CoV-2 by FDA under an Emergency Use Authorization (EUA). This EUA will remain in effect (meaning this test can be used) for the duration of the COVID-19 declaration under Section 564(b)(1) of the Act, 21 U.S.C. section 360bbb-3(b)(1), unless the authorization is terminated or revoked.     Resp Syncytial Virus by PCR NEGATIVE NEGATIVE    Comment: (NOTE) Fact Sheet for Patients: EntrepreneurPulse.com.au  Fact Sheet for Healthcare Providers: IncredibleEmployment.be  This test is not yet approved or cleared by the [place] FDA and has been  authorized for detection and/or diagnosis of SARS-CoV-2 by FDA under an Emergency Use Authorization (EUA). This EUA will remain in effect (meaning this test can be used) for the duration of the COVID-19 declaration under Section 564(b)(1) of the Act, 21 U.S.C. section 360bbb-3(b)(1),  unless the authorization is terminated or revoked.  Performed at [facility], [place] [address], [place], [place] [address]   Urinalysis, Routine w reflex microscopic Urine, Clean Catch     Status: Abnormal   Collection Time: 05/06/22  9:09 AM  Result Value Ref Range   Color, Urine YELLOW YELLOW   APPearance HAZY (A) CLEAR   Specific Gravity, Urine 1.012 1.005 - 1.030   pH 6.0 5.0 - 8.0   Glucose, UA NEGATIVE NEGATIVE mg/dL   Hgb urine dipstick NEGATIVE NEGATIVE   Bilirubin Urine NEGATIVE NEGATIVE   Ketones, ur NEGATIVE NEGATIVE mg/dL   Protein, ur NEGATIVE NEGATIVE mg/dL   Nitrite NEGATIVE NEGATIVE   Leukocytes,Ua NEGATIVE NEGATIVE    Comment: Performed at [place] [address], [place], [place] [address]  CBC with Differential/Platelet     Status: Abnormal   Collection Time: 05/07/22 10:16 AM  Result Value Ref Range   WBC 12.3 (H) 4.0 - 10.5 K/uL   RBC 3.01 (L) 3.87 - 5.11 MIL/uL   Hemoglobin 9.4 (L) 12.0 - 15.0 g/dL   HCT 29.8 (L) 36.0 - 46.0 %   MCV 99.0 80.0 - 100.0 fL   MCH 31.2 26.0 - 34.0 pg   MCHC 31.5 30.0 - 36.0 g/dL   RDW 17.9 (H) 11.5 - 15.5 %   Platelets 167 150 - 400 K/uL   nRBC 0.2 0.0 - 0.2 %   Neutrophils Relative % 82 %   Neutro Abs 10.1 (H) 1.7 - 7.7 K/uL   Lymphocytes Relative 4 %   Lymphs Abs 0.5 (L) 0.7 - 4.0 K/uL   Monocytes Relative 13 %   Monocytes Absolute 1.6 (H) 0.1 - 1.0 K/uL   Eosinophils Relative 0 %   Eosinophils Absolute 0.0 0.0 - 0.5 K/uL   Basophils Relative 0 %   Basophils Absolute 0.0 0.0 - 0.1 K/uL   Immature Granulocytes 1 %   Abs Immature Granulocytes 0.13 (H) 0.00 - 0.07 K/uL    Comment: Performed at  [facility], [place] [address], [place], [place] [address]  Comprehensive metabolic panel     Status: Abnormal   Collection Time: 05/07/22 10:16 AM  Result Value Ref Range   Sodium 140 135 - 145 mmol/L   Potassium 3.3 (L) 3.5 - 5.1 mmol/L   Chloride 107 98 - 111 mmol/L   CO2 23 22 - 32 mmol/L   Glucose, Bld 82 70 - 99 mg/dL    Comment: Glucose reference range applies only to samples taken after fasting for at least 8 hours.   BUN <5 (L) 6 - 20 mg/dL   Creatinine, Ser 0.43 (L) 0.44 - 1.00 mg/dL   Calcium 7.8 (L) 8.9 - 10.3 mg/dL   Total Protein 5.5 (L) 6.5 - 8.1 g/dL   Albumin 2.3 (L) 3.5 - 5.0 g/dL   AST 20 15 - 41 U/L   ALT 20 0 - 44 U/L   Alkaline Phosphatase 217 (H) 38 - 126 U/L   Total Bilirubin 0.5 0.3 - 1.2 mg/dL   GFR, Estimated >60 >60 mL/min    Comment: (NOTE) Calculated using the CKD-EPI Creatinine Equation (2021)    Anion gap 10 5 - 15    Comment: Performed at [facility], [place] [address], [place], [place] [address]  Phosphorus     Status: None   Collection Time: 05/07/22 10:16 AM  Result Value Ref Range   Phosphorus 3.2 2.5 - 4.6 mg/dL  Comment: Performed at [facility], [place] [address] [place], [place] [address]  Magnesium     Status: None   Collection Time: 05/07/22 10:16 AM  Result Value Ref Range   Magnesium 2.0 1.7 - 2.4 mg/dL    Comment: Performed at [facility], [place] [address] [place], [place] [address]  Culture, blood (Routine x 2)     Status: None   Collection Time: 05/07/22 12:05 PM   Specimen: BLOOD  Result Value Ref Range   Specimen Description      BLOOD PORTA CATH Performed at [place] [address] [place], [place] [address]    Special Requests      BOTTLES DRAWN AEROBIC AND ANAEROBIC Blood Culture adequate volume Performed at [place] [address], [place], [place] [address]    Culture      NO GROWTH 5  DAYS Performed at [facility], [place] [address] [place], [place] [address]    Report Status 05/12/2022 FINAL   CBC with Differential/Platelet     Status: Abnormal   Collection Time: 05/08/22  3:36 AM  Result Value Ref Range   WBC 9.6 4.0 - 10.5 K/uL   RBC 2.95 (L) 3.87 - 5.11 MIL/uL   Hemoglobin 9.2 (L) 12.0 - 15.0 g/dL   HCT 28.8 (L) 36.0 - 46.0 %   MCV 97.6 80.0 - 100.0 fL   MCH 31.2 26.0 - 34.0 pg   MCHC 31.9 30.0 - 36.0 g/dL   RDW 17.6 (H) 11.5 - 15.5 %   Platelets 180 150 - 400 K/uL   nRBC 0.0 0.0 - 0.2 %   Neutrophils Relative % 80 %   Neutro Abs 7.7 1.7 - 7.7 K/uL   Lymphocytes Relative 5 %   Lymphs Abs 0.5 (L) 0.7 - 4.0 K/uL   Monocytes Relative 13 %   Monocytes Absolute 1.3 (H) 0.1 - 1.0 K/uL   Eosinophils Relative 1 %   Eosinophils Absolute 0.1 0.0 - 0.5 K/uL   Basophils Relative 0 %   Basophils Absolute 0.0 0.0 - 0.1 K/uL   Immature Granulocytes 1 %   Abs Immature Granulocytes 0.08 (H) 0.00 - 0.07 K/uL    Comment: Performed at [facility], [place] [address] [place], [place] [address]  Comprehensive metabolic panel     Status: Abnormal   Collection Time: 05/08/22  3:36 AM  Result Value Ref Range   Sodium 138 135 - 145 mmol/L   Potassium 2.9 (L) 3.5 - 5.1 mmol/L   Chloride 105 98 - 111 mmol/L   CO2 25 22 - 32 mmol/L   Glucose, Bld 84 70 - 99 mg/dL    Comment: Glucose reference range applies only to samples taken after fasting for at least 8 hours.   BUN 5 (L) 6 - 20 mg/dL   Creatinine, Ser 0.36 (L) 0.44 - 1.00 mg/dL   Calcium 7.7 (L) 8.9 - 10.3 mg/dL   Total Protein 5.4 (L) 6.5 - 8.1 g/dL   Albumin 2.3 (L) 3.5 - 5.0 g/dL   AST 18 15 - 41 U/L   ALT 17 0 - 44 U/L   Alkaline Phosphatase 216 (H) 38 - 126 U/L   Total Bilirubin 0.5 0.3 - 1.2 mg/dL   GFR, Estimated >60 >60 mL/min    Comment: (NOTE) Calculated using the CKD-EPI Creatinine Equation (2021)    Anion gap 8 5 - 15    Comment: Performed at [facility], [place]  [address], [place], [place] [address]  Phosphorus     Status: None   Collection Time: 05/08/22  3:36 AM  Result Value Ref Range   Phosphorus 3.4 2.5 - 4.6 mg/dL    Comment: Performed at [facility], [place] [address], [place], [place] [address]  Magnesium     Status: None   Collection Time: 05/08/22  3:36 AM  Result Value Ref Range   Magnesium 2.0 1.7 - 2.4 mg/dL    Comment: Performed at [facility], [place] [address] [place], [place] [address]  CBC with Differential/Platelet     Status: Abnormal   Collection Time: 05/09/22  3:46 AM  Result Value Ref Range   WBC 9.0 4.0 - 10.5 K/uL   RBC 2.73 (L) 3.87 - 5.11 MIL/uL   Hemoglobin 8.7 (L) 12.0 - 15.0 g/dL   HCT 26.5 (L) 36.0 - 46.0 %   MCV 97.1 80.0 - 100.0 fL   MCH 31.9 26.0 - 34.0 pg   MCHC 32.8 30.0 - 36.0 g/dL   RDW 17.5 (H) 11.5 - 15.5 %   Platelets 175 150 - 400 K/uL   nRBC 0.0 0.0 - 0.2 %   Neutrophils Relative % 79 %   Neutro Abs 7.0 1.7 - 7.7 K/uL   Lymphocytes Relative 5 %   Lymphs Abs 0.5 (L) 0.7 - 4.0 K/uL   Monocytes Relative 14 %   Monocytes Absolute 1.3 (H) 0.1 - 1.0 K/uL   Eosinophils Relative 1 %   Eosinophils Absolute 0.1 0.0 - 0.5 K/uL   Basophils Relative 0 %   Basophils Absolute 0.0 0.0 - 0.1 K/uL   Immature Granulocytes 1 %   Abs Immature Granulocytes 0.08 (H) 0.00 - 0.07 K/uL    Comment: Performed at [facility], [place] [address] [place], [place] [address]   *Note: Due to a large number of results and/or encounters for the requested time period, some results have not been displayed. A complete set of results can be found in Results Review.        IAnn Held, DO, personally preformed the services described in this documentation.  All medical record entries made by the scribe were at my direction and in my presence.  I have reviewed the chart and discharge instructions (if applicable) and agree that the record reflects my personal  performance and is accurate and complete. 05/17/2022   I,[name],acting as a scribe for [name], DO.,have documented all relevant documentation on the behalf of [name], DO,as directed by  [name], DO while in the presence of [name], DO.   [name], DO

## 2022-05-17 NOTE — Patient Instructions (Signed)
Community-Acquired Pneumonia, Adult Pneumonia is a lung infection that causes inflammation and the buildup of mucus and fluids in the lungs. This may cause coughing and difficulty breathing. Community-acquired pneumonia is pneumonia that develops in people who are not, and have not recently been, in a hospital or other health care facility. Usually, pneumonia develops as a result of an illness that is caused by a virus, such as the common cold and the flu (influenza). It can also be caused by bacteria or fungi. While the common cold and influenza can pass from person to person (are contagious), pneumonia itself is not considered contagious. What are the causes? This condition may be caused by: Viruses. Bacteria. Fungi. What increases the risk? The following factors may make you more likely to develop this condition: Being over age 65 or having certain medical conditions, such as: A long-term (chronic) disease, such as: chronic obstructive pulmonary disease (COPD), asthma, heart failure, diabetes, or kidney disease. A condition that increases the risk of breathing in (aspirating) mucus and other fluids from your mouth and nose. A weakened body defense system (immune system). Having had your spleen removed (splenectomy). The spleen is the organ that helps fight germs and infections. Not cleaning your teeth and gums well (poor dental hygiene). Using tobacco products. Traveling to places where germs that cause pneumonia are present or being near certain animals or animal habitats that could have germs that cause pneumonia. What are the signs or symptoms? Symptoms of this condition include: A dry cough or a wet (productive) cough. A fever, sweating, or chills. Chest pain, especially when breathing deeply or coughing. Fast breathing, difficulty breathing, or shortness of breath. Tiredness (fatigue) and muscle aches. How is this diagnosed? This condition may be diagnosed based on your medical  history or a physical exam. You may also have tests, including: Imaging, such as a chest X-ray or lung ultrasound. Tests of: The level of oxygen and other gases in your blood. Mucus from your lungs (sputum). Fluid around your lungs (pleural fluid). Your urine. How is this treated? Treatment for this condition depends on many factors, such as the cause of your pneumonia, your medicines, and other medical conditions that you have. For most adults, pneumonia may be treated at home. In some cases, treatment must happen in a hospital and may include: Medicines that are given by mouth (orally) or through an IV, including: Antibiotic medicines, if bacteria caused the pneumonia. Medicines that kill viruses (antiviral medicines), if a virus caused the pneumonia. Oxygen therapy. Severe pneumonia, although rare, may require the following treatments: Mechanical ventilation.This procedure uses a machine to help you breathe if you cannot breathe well on your own or maintain a safe level of blood oxygen. Thoracentesis. This procedure removes any buildup of pleural fluid to help with breathing. Follow these instructions at home:  Medicines Take over-the-counter and prescription medicines only as told by your health care provider. Take cough medicine only if you have trouble sleeping. Cough medicine can prevent your body from removing mucus from your lungs. If you were prescribed antibiotics, take them as told by your health care provider. Do not stop taking the antibiotic even if you start to feel better. Lifestyle     Do not drink alcohol. Do not use any products that contain nicotine or tobacco. These products include cigarettes, chewing tobacco, and vaping devices, such as e-cigarettes. If you need help quitting, ask your health care provider. Eat a healthy diet. This includes plenty of vegetables, fruits, whole grains, low-fat   dairy products, and lean protein. General instructions Rest a lot and  get at least 8 hours of sleep each night. Sleep in a partly upright position at night. Place a few pillows under your head or sleep in a reclining chair. Return to your normal activities as told by your health care provider. Ask your health care provider what activities are safe for you. Drink enough fluid to keep your urine pale yellow. This helps to thin the mucus in your lungs. If your throat is sore, gargle with a mixture of salt and water 3-4 times a day or as needed. To make salt water, completely dissolve -1 tsp (3-6 g) of salt in 1 cup (237 mL) of warm water. Keep all follow-up visits. How is this prevented? You can lower your risk of developing community-acquired pneumonia by: Getting the pneumonia vaccine. There are different types and schedules of pneumonia vaccines. Ask your health care provider which option is best for you. Consider getting the pneumonia vaccine if: You are older than 52 years of age. You are 19-65 years of age and are receiving cancer treatment, have chronic lung disease, or have other medical conditions that affect your immune system. Ask your health care provider if this applies to you. Getting your influenza vaccine every year. Ask your health care provider which type of vaccine is best for you. Getting regular dental checkups. Washing your hands often with soap and water for at least 20 seconds. If soap and water are not available, use hand sanitizer. Contact a health care provider if: You have a fever. You have trouble sleeping because you cannot control your cough with cough medicine. Get help right away if: Your shortness of breath becomes worse. Your chest pain increases. Your sickness becomes worse, especially if you are an older adult or have a weak immune system. You cough up blood. These symptoms may be an emergency. Get help right away. Call 911. Do not wait to see if the symptoms will go away. Do not drive yourself to the  hospital. Summary Pneumonia is an infection of the lungs. Community-acquired pneumonia develops in people who have not been in the hospital. It can be caused by bacteria, viruses, or fungi. This condition may be treated with antibiotics or antiviral medicines. Severe pneumonia may require a hospital stay and treatment to help with breathing. This information is not intended to replace advice given to you by your health care provider. Make sure you discuss any questions you have with your health care provider. Document Revised: 06/07/2021 Document Reviewed: 06/07/2021 Elsevier Patient Education  2023 Elsevier Inc.  

## 2022-05-17 NOTE — Assessment & Plan Note (Signed)
Well controlled, no changes to meds. Encouraged heart healthy diet such as the DASH diet and exercise as tolerated.  °

## 2022-05-18 ENCOUNTER — Inpatient Hospital Stay: Payer: BC Managed Care – PPO

## 2022-05-18 ENCOUNTER — Encounter: Payer: Self-pay | Admitting: *Deleted

## 2022-05-18 LAB — CANCER ANTIGEN 19-9: CA 19-9: 79 U/mL — ABNORMAL HIGH (ref 0–35)

## 2022-05-23 ENCOUNTER — Encounter: Payer: Self-pay | Admitting: *Deleted

## 2022-05-23 ENCOUNTER — Inpatient Hospital Stay: Payer: BC Managed Care – PPO | Admitting: Family

## 2022-05-23 ENCOUNTER — Inpatient Hospital Stay: Payer: BC Managed Care – PPO

## 2022-05-23 ENCOUNTER — Encounter: Payer: Self-pay | Admitting: Family

## 2022-05-23 ENCOUNTER — Inpatient Hospital Stay: Payer: BC Managed Care – PPO | Admitting: Licensed Clinical Social Worker

## 2022-05-23 ENCOUNTER — Other Ambulatory Visit: Payer: Self-pay | Admitting: Family

## 2022-05-23 VITALS — BP 112/83 | HR 111 | Temp 98.4°F | Resp 18 | Wt 167.1 lb

## 2022-05-23 VITALS — BP 122/75 | HR 112 | Resp 17

## 2022-05-23 DIAGNOSIS — C259 Malignant neoplasm of pancreas, unspecified: Secondary | ICD-10-CM

## 2022-05-23 DIAGNOSIS — J189 Pneumonia, unspecified organism: Secondary | ICD-10-CM

## 2022-05-23 DIAGNOSIS — C7951 Secondary malignant neoplasm of bone: Secondary | ICD-10-CM | POA: Diagnosis not present

## 2022-05-23 DIAGNOSIS — Z5111 Encounter for antineoplastic chemotherapy: Secondary | ICD-10-CM | POA: Diagnosis not present

## 2022-05-23 DIAGNOSIS — D500 Iron deficiency anemia secondary to blood loss (chronic): Secondary | ICD-10-CM | POA: Diagnosis not present

## 2022-05-23 DIAGNOSIS — E876 Hypokalemia: Secondary | ICD-10-CM

## 2022-05-23 DIAGNOSIS — Z79899 Other long term (current) drug therapy: Secondary | ICD-10-CM | POA: Diagnosis not present

## 2022-05-23 DIAGNOSIS — C787 Secondary malignant neoplasm of liver and intrahepatic bile duct: Secondary | ICD-10-CM | POA: Diagnosis not present

## 2022-05-23 LAB — CMP (CANCER CENTER ONLY)
ALT: 8 U/L (ref 0–44)
AST: 22 U/L (ref 15–41)
Albumin: 3.2 g/dL — ABNORMAL LOW (ref 3.5–5.0)
Alkaline Phosphatase: 213 U/L — ABNORMAL HIGH (ref 38–126)
Anion gap: 11 (ref 5–15)
BUN: <5 mg/dL — ABNORMAL LOW (ref 6–20)
CO2: 27 mmol/L (ref 22–32)
Calcium: 9.3 mg/dL (ref 8.9–10.3)
Chloride: 103 mmol/L (ref 98–111)
Creatinine: 0.36 mg/dL — ABNORMAL LOW (ref 0.44–1.00)
GFR, Estimated: >60 mL/min (ref >60)
Glucose, Bld: 116 mg/dL — ABNORMAL HIGH (ref 70–99)
Potassium: 2.9 mmol/L — ABNORMAL LOW (ref 3.5–5.1)
Sodium: 141 mmol/L (ref 135–145)
Total Bilirubin: 0.8 mg/dL (ref 0.3–1.2)
Total Protein: 6.5 g/dL (ref 6.5–8.1)

## 2022-05-23 LAB — CBC WITH DIFFERENTIAL (CANCER CENTER ONLY)
Abs Immature Granulocytes: 0.03 10*3/uL (ref 0.00–0.07)
Basophils Absolute: 0 10*3/uL (ref 0.0–0.1)
Basophils Relative: 0 %
Eosinophils Absolute: 0.3 10*3/uL (ref 0.0–0.5)
Eosinophils Relative: 5 %
HCT: 32.7 % — ABNORMAL LOW (ref 36.0–46.0)
Hemoglobin: 10.5 g/dL — ABNORMAL LOW (ref 12.0–15.0)
Immature Granulocytes: 0 %
Lymphocytes Relative: 7 %
Lymphs Abs: 0.5 10*3/uL — ABNORMAL LOW (ref 0.7–4.0)
MCH: 31 pg (ref 26.0–34.0)
MCHC: 32.1 g/dL (ref 30.0–36.0)
MCV: 96.5 fL (ref 80.0–100.0)
Monocytes Absolute: 0.7 10*3/uL (ref 0.1–1.0)
Monocytes Relative: 11 %
Neutro Abs: 5.2 10*3/uL (ref 1.7–7.7)
Neutrophils Relative %: 77 %
Platelet Count: 319 10*3/uL (ref 150–400)
RBC: 3.39 MIL/uL — ABNORMAL LOW (ref 3.87–5.11)
RDW: 17.2 % — ABNORMAL HIGH (ref 11.5–15.5)
WBC Count: 6.8 10*3/uL (ref 4.0–10.5)
nRBC: 0 % (ref 0.0–0.2)

## 2022-05-23 LAB — IRON AND IRON BINDING CAPACITY (CC-WL,HP ONLY)
Iron: 44 ug/dL (ref 28–170)
Saturation Ratios: 28 % (ref 10.4–31.8)
TIBC: 160 ug/dL — ABNORMAL LOW (ref 250–450)
UIBC: 116 ug/dL

## 2022-05-23 LAB — FERRITIN: Ferritin: 2874 ng/mL — ABNORMAL HIGH (ref 11–307)

## 2022-05-23 LAB — LACTATE DEHYDROGENASE: LDH: 581 U/L — ABNORMAL HIGH (ref 98–192)

## 2022-05-23 MED ORDER — ATROPINE SULFATE 1 MG/ML IV SOLN
0.5000 mg | Freq: Once | INTRAVENOUS | Status: AC | PRN
  Administered 2022-05-23: 0.5 mg via INTRAVENOUS
  Filled 2022-05-23: qty 1

## 2022-05-23 MED ORDER — SODIUM CHLORIDE 0.9 % IV SOLN
1920.0000 mg/m2 | INTRAVENOUS | Status: DC
  Administered 2022-05-23: 3900 mg via INTRAVENOUS
  Filled 2022-05-23: qty 78

## 2022-05-23 MED ORDER — SODIUM CHLORIDE 0.9 % IV SOLN
120.0000 mg/m2 | Freq: Once | INTRAVENOUS | Status: AC
  Administered 2022-05-23: 240 mg via INTRAVENOUS
  Filled 2022-05-23: qty 10

## 2022-05-23 MED ORDER — SODIUM CHLORIDE 0.9% FLUSH: 10.0000 mL | INTRAVENOUS | Status: DC | PRN

## 2022-05-23 MED ORDER — SODIUM CHLORIDE 0.9 % IV SOLN
150.0000 mg | Freq: Once | INTRAVENOUS | Status: AC
  Administered 2022-05-23: 150 mg via INTRAVENOUS
  Filled 2022-05-23: qty 150

## 2022-05-23 MED ORDER — POTASSIUM CHLORIDE CRYS ER 20 MEQ PO TBCR
40.0000 meq | EXTENDED_RELEASE_TABLET | Freq: Once | ORAL | Status: AC
  Administered 2022-05-23: 40 meq via ORAL
  Filled 2022-05-23: qty 2

## 2022-05-23 MED ORDER — HEPARIN SOD (PORK) LOCK FLUSH 100 UNIT/ML IV SOLN: 500.0000 [IU] | Freq: Once | INTRAVENOUS | Status: DC | PRN

## 2022-05-23 MED ORDER — PALONOSETRON HCL INJECTION 0.25 MG/5ML
0.2500 mg | Freq: Once | INTRAVENOUS | Status: AC
  Administered 2022-05-23: 0.25 mg via INTRAVENOUS
  Filled 2022-05-23: qty 5

## 2022-05-23 MED ORDER — SODIUM CHLORIDE 0.9 % IV SOLN
10.0000 mg | Freq: Once | INTRAVENOUS | Status: AC
  Administered 2022-05-23: 10 mg via INTRAVENOUS
  Filled 2022-05-23: qty 10

## 2022-05-23 MED ORDER — DEXTROSE 5 % IV SOLN: Freq: Once | INTRAVENOUS | Status: AC

## 2022-05-23 MED ORDER — SODIUM CHLORIDE 0.9 % IV SOLN
400.0000 mg/m2 | Freq: Once | INTRAVENOUS | Status: AC
  Administered 2022-05-23: 812 mg via INTRAVENOUS
  Filled 2022-05-23: qty 40.6

## 2022-05-23 MED ORDER — FAMOTIDINE IN NACL 20-0.9 MG/50ML-% IV SOLN
20.0000 mg | Freq: Once | INTRAVENOUS | Status: AC | PRN
  Administered 2022-05-23: 20 mg via INTRAVENOUS

## 2022-05-23 MED ORDER — OXALIPLATIN CHEMO INJECTION 100 MG/20ML
68.0000 mg/m2 | Freq: Once | INTRAVENOUS | Status: AC
  Administered 2022-05-23: 140 mg via INTRAVENOUS
  Filled 2022-05-23: qty 28

## 2022-05-23 NOTE — Progress Notes (Unsigned)
Patient is cleared to resume treatment today. She will received cycle three. She will not need scans until after cycle four.   Oncology Nurse Navigator Documentation     05/23/2022    9:30 AM  Oncology Nurse Navigator Flowsheets  Navigator Follow Up Date: 06/06/2022  Navigator Follow Up Reason: Follow-up Appointment;Chemotherapy  Navigator Location [facility]  Navigator Encounter Type Treatment;Appt/Treatment Plan Review  Patient Visit Type MedOnc  Treatment Phase Active Tx  Barriers/Navigation Needs Coordination of Care;Education  Interventions Psycho-Social Support  Acuity Level 2-Minimal Needs (1-2 Barriers Identified)  Support Groups/Services Friends and Family  Time Spent with Patient 15

## 2022-05-23 NOTE — Progress Notes (Signed)
[facility] CSW Progress Note  Holiday representative met with patient to assess needs.  Patient reported feeling comfortable while receiving her infusion.  She is currently on short term disability from work.  Discussed advance directives.  She said she has the forms filled out, but just needs it notarized.  Explored several ways she could do this, including having CSW provide the service.  She feels she needs to make arrangements because she is not sure how much longer she will be here.  CSW provided a safe place for her to express her thoughts about her decline.  Patient said she has all of her basic needs met.    Rodman Pickle Dakotah Orrego, LCSW

## 2022-05-23 NOTE — Progress Notes (Signed)
Hematology and Oncology Follow Up Visit  Makayla Good [id number] [birth year] 52 y.o. 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 She was recently in the hospital with pneumonia. She has completed her antibiotics and is starting to feel better.  She still has some fatigue, weakness and occasional palpitations.  SOB and cough are improved.  No fever, chills, n/v, rash, dizziness, chest pain, abdominal pain or changes in bowel or bladder habits.  She drinks apple juice with Miralax as needed for constipation.  No blood loss noted. No bruising or petechiae.  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  She has sciatica at times in the right leg. She takes a muscle relaxer as needed. Some lower back pain at times.  She has swelling in her lower extremities that waxes and wanes. No pitting edema. Pedal pulses are 2+.  No falls or syncope reported.  She ambulates with a cane for added support.  Appetite comes and goes. She is using protein supplements. Hydration good. Weight is stable at 167 lbs.   ECOG Performance Status: 1 - Symptomatic but completely ambulatory  Medications:  Allergies as of 05/23/2022   No Known Allergies      Medication List        Accurate as of May 23, 2022  9:11 AM. If you have any questions, ask your nurse or doctor.          (feeding supplement) PROSource Plus liquid Take 30 mLs by mouth 3 (three) times daily between meals.   Ensure Max Protein Liqd Take 330 mLs (11 oz total) by mouth daily.   acetaminophen 325 MG tablet Commonly known as: TYLENOL Take 2 tablets (650 mg total) by mouth every 6 (six) hours as needed for mild pain (or Fever >/= 101).    cyclobenzaprine 10 MG tablet Commonly known as: FLEXERIL TAKE 1 TABLET BY MOUTH AT BEDTIME AS NEEDED FOR MUSCLE SPASMS   dexamethasone 4 MG tablet Commonly known as: DECADRON Take 2 tablets (8 mg total) by mouth daily. Take 2 tablets daily x 3 days starting the day after chemotherapy. Take with food.   fluticasone 50 MCG/ACT nasal spray Commonly known as: FLONASE SPRAY 2 SPRAYS INTO EACH NOSTRIL EVERY DAY What changed: See the new instructions.   HYDROcodone bit-homatropine 5-1.5 MG/5ML syrup Commonly known as: HYCODAN Take 5 mLs by mouth every 4 (four) hours as needed for cough.   Ipratropium-Albuterol 20-100 MCG/ACT Aers respimat Commonly known as: COMBIVENT Inhale 1 puff into the lungs 3 (three) times daily for 5 days, THEN 1 puff every 6 (six) hours as needed for wheezing. Start taking on: May 12, 2022   lidocaine-prilocaine cream Commonly known as: EMLA Apply to affected area once   loperamide 2 MG capsule Commonly known as: IMODIUM Take 2 tabs by mouth with first loose stool, then 1 tab with each additional loose stool as needed. Do not exceed 8 tabs in a 24-hour period   omeprazole 40 MG capsule Commonly known as: PRILOSEC Take 1 capsule (40 mg total) by mouth daily.   ondansetron 8 MG tablet Commonly known as: Zofran Take 1 tablet (8 mg total) by mouth every 8 (eight) hours as needed for nausea or  vomiting. Start on the third day after cisplatin   Oxycodone HCl 10 MG Tabs Take 1 tablet (10 mg total) by mouth every 6 (six) hours as needed. What changed: reasons to take this   prochlorperazine 10 MG tablet Commonly known as: COMPAZINE Take 1 tablet (10 mg total) by mouth every 6 (six) hours as needed for nausea or vomiting (Nausea or vomiting).   temazepam 15 MG capsule Commonly known as: RESTORIL Take 1 capsule (15 mg total) by mouth at bedtime as needed for sleep.        Allergies: No Known Allergies  Past Medical History, Surgical history, Social  history, and Family History were reviewed and updated.  Review of Systems: All other 10 point review of systems is negative.   Physical Exam:  weight is 167 lb 1.3 oz (75.8 kg). Her oral temperature is 98.4 F (36.9 C). Her blood pressure is 112/83 and her pulse is 111 (abnormal). Her respiration is 18 and oxygen saturation is 100%.   Wt Readings from Last 3 Encounters:  05/23/22 167 lb 1.3 oz (75.8 kg)  05/17/22 168 lb 6.4 oz (76.4 kg)  05/16/22 168 lb (76.2 kg)    Ocular: Sclerae unicteric, pupils equal, round and reactive to light Ear-nose-throat: Oropharynx clear, dentition fair Lymphatic: No cervical or supraclavicular adenopathy Lungs no rales or rhonchi, good excursion bilaterally Heart regular rate and rhythm, no murmur appreciated Abd soft, nontender, positive bowel sounds MSK no focal spinal tenderness, no joint edema Neuro: non-focal, well-oriented, appropriate affect Breasts: Deferred   Lab Results  Component Value Date   WBC 6.8 05/23/2022   HGB 10.5 (L) 05/23/2022   HCT 32.7 (L) 05/23/2022   MCV 96.5 05/23/2022   PLT 319 05/23/2022   Lab Results  Component Value Date   FERRITIN 775 (H) 05/09/2022   IRON 19 (L) 05/09/2022   TIBC 134 (L) 05/09/2022   UIBC 115 05/09/2022   IRONPCTSAT 14 05/09/2022   Lab Results  Component Value Date   RETICCTPCT 1.9 05/09/2022   RBC 3.39 (L) 05/23/2022   No results found for: "KPAFRELGTCHN", "LAMBDASER", "KAPLAMBRATIO" No results found for: "IGGSERUM", "IGA", "IGMSERUM" No results found for: "TOTALPROTELP", "ALBUMINELP", "A1GS", "A2GS", "BETS", "BETA2SER", "GAMS", "MSPIKE", "SPEI"   Chemistry      Component Value Date/Time   NA 139 05/16/2022 0832   K 3.4 (L) 05/16/2022 0832   CL 102 05/16/2022 0832   CO2 25 05/16/2022 0832   BUN <5 (L) 05/16/2022 0832   CREATININE 0.38 (L) 05/16/2022 0832   CREATININE 0.79 12/08/2021 1420      Component Value Date/Time   CALCIUM 9.1 05/16/2022 0832   ALKPHOS 205 (H) 05/16/2022  0832   AST 25 05/16/2022 0832   ALT 17 05/16/2022 0832   BILITOT 0.6 05/16/2022 0832       Impression and Plan: Makayla Good is a very pleasant 52 yo African American female with metastatic pancreatic adenocarcinoma. CA 19-9 last visit was down to 79! So far she is tolerating treatment with FOLFIRINOX nicely. We will proceed with cycle 3 today per Dr. [name].  We will repeat scans after cycle 4.  Follow-up in 2 weeks.   Lottie Dawson, NP 1/31/20249:11 AM

## 2022-05-23 NOTE — Patient Instructions (Addendum)
[facility]  Discharge Instructions: Thank you for choosing [facility] to provide your oncology and hematology care.   If you have a lab appointment with the [facility], please go directly to the [facility] and check in at the registration area.  Wear comfortable clothing and clothing appropriate for easy access to any Portacath or PICC line.   We strive to give you quality time with your provider. You may need to reschedule your appointment if you arrive late (15 or more minutes).  Arriving late affects you and other patients whose appointments are after yours.  Also, if you miss three or more appointments without notifying the office, you may be dismissed from the clinic at the provider's discretion.      For prescription refill requests, have your pharmacy contact our office and allow 72 hours for refills to be completed.    Today you received the following chemotherapy and/or immunotherapy agents Irinotecan,Oxaliplatin, Leucovorin, and 5FU      To help prevent nausea and vomiting after your treatment, we encourage you to take your nausea medication as directed.  BELOW ARE SYMPTOMS THAT SHOULD BE REPORTED IMMEDIATELY: *FEVER GREATER THAN 100.4 F (38 C) OR HIGHER *CHILLS OR SWEATING *NAUSEA AND VOMITING THAT IS NOT CONTROLLED WITH YOUR NAUSEA MEDICATION *UNUSUAL SHORTNESS OF BREATH *UNUSUAL BRUISING OR BLEEDING *URINARY PROBLEMS (pain or burning when urinating, or frequent urination) *BOWEL PROBLEMS (unusual diarrhea, constipation, pain near the anus) TENDERNESS IN MOUTH AND THROAT WITH OR WITHOUT PRESENCE OF ULCERS (sore throat, sores in mouth, or a toothache) UNUSUAL RASH, SWELLING OR PAIN  UNUSUAL VAGINAL DISCHARGE OR ITCHING   Items with * indicate a potential emergency and should be followed up as soon as possible or go to the Emergency Department if any problems should occur.  Please show the CHEMOTHERAPY ALERT CARD or  IMMUNOTHERAPY ALERT CARD at check-in to the Emergency Department and triage nurse. Should you have questions after your visit or need to cancel or reschedule your appointment, please contact [facility]  [phone] and follow the prompts.  Office hours are 8:00 a.m. to 4:30 p.m. Monday - Friday. Please note that voicemails left after 4:00 p.m. may not be returned until the following business day.  We are closed weekends and major holidays. You have access to a nurse at all times for urgent questions. Please call the main number to the clinic [phone] and follow the prompts.  For any non-urgent questions, you may also contact your provider using MyChart. We now offer e-Visits for anyone 80 and older to request care online for non-urgent symptoms. For details visit [email or web address].   Also download the MyChart app! Go to the app store, search "MyChart", open the app, select [facility], and log in with your MyChart username and password.

## 2022-05-23 NOTE — Progress Notes (Unsigned)
Reviewed pt labs and vitals with Lottie Dawson NP and pt ok to treat.   Hypersensitivity Reaction note  Date of event: 05/23/22 Time of event: 1500 Generic name of drug involved: FOLFIRINOX Name of provider notified of the hypersensitivity reaction: Dr. [name] and Lottie Dawson NP Was agent that likely caused hypersensitivity reaction added to Allergies List within EMR? No as patient has tolerated treatment before and patient received full dose of all chemotherapies. At this time unsure what chemotherapy caused reaction. Chain of events including reaction signs/symptoms, treatment administered, and outcome (e.g., drug resumed; drug discontinued; sent to Emergency Department; etc.) Upon completion of Irinotecan/Leucovorin pt walked to bathroom and stated upon return to sitting that she felt "jittery and lightheaded" Pt stated her joints "were acting up also". Pt stated her joints were locking up and she felt "spacey"  Vitals as charted. Pt denied any other complaints. Lottie Dawson NP and Dr. [name] aware. Orders received and pt medicated per orders; refer to [facility]. Pt observed and normal saline bolus given.  1530: Pt states she feels better. Pt sitting comfortably in chair. VSS. Dr. [name] aware and pt ok to receive IVF bolus and be discharged home. Pt agreeable to plan.  1551: PT left with brother in no apparent distress.  Shelda Altes, RN 05/23/2022 3:10 PM

## 2022-05-24 ENCOUNTER — Encounter: Payer: Self-pay | Admitting: Hematology & Oncology

## 2022-05-25 ENCOUNTER — Telehealth: Payer: Self-pay | Admitting: Family Medicine

## 2022-05-25 ENCOUNTER — Inpatient Hospital Stay: Payer: BC Managed Care – PPO | Attending: Hematology & Oncology

## 2022-05-25 VITALS — BP 127/94 | HR 112 | Temp 98.6°F | Resp 18

## 2022-05-25 DIAGNOSIS — Z5111 Encounter for antineoplastic chemotherapy: Secondary | ICD-10-CM | POA: Diagnosis not present

## 2022-05-25 DIAGNOSIS — Z79899 Other long term (current) drug therapy: Secondary | ICD-10-CM | POA: Diagnosis not present

## 2022-05-25 DIAGNOSIS — C259 Malignant neoplasm of pancreas, unspecified: Secondary | ICD-10-CM

## 2022-05-25 DIAGNOSIS — C787 Secondary malignant neoplasm of liver and intrahepatic bile duct: Secondary | ICD-10-CM | POA: Diagnosis not present

## 2022-05-25 DIAGNOSIS — F32A Depression, unspecified: Secondary | ICD-10-CM | POA: Insufficient documentation

## 2022-05-25 DIAGNOSIS — F419 Anxiety disorder, unspecified: Secondary | ICD-10-CM | POA: Insufficient documentation

## 2022-05-25 LAB — CANCER ANTIGEN 19-9: CA 19-9: 75 U/mL — ABNORMAL HIGH (ref 0–35)

## 2022-05-25 MED ORDER — HEPARIN SOD (PORK) LOCK FLUSH 100 UNIT/ML IV SOLN
500.0000 [IU] | Freq: Once | INTRAVENOUS | Status: AC | PRN
  Administered 2022-05-25: 500 [IU]

## 2022-05-25 MED ORDER — SODIUM CHLORIDE 0.9% FLUSH
10.0000 mL | INTRAVENOUS | Status: DC | PRN
  Administered 2022-05-25: 10 mL

## 2022-05-25 MED ORDER — PEGFILGRASTIM-CBQV 6 MG/0.6ML ~~LOC~~ SOSY
6.0000 mg | PREFILLED_SYRINGE | Freq: Once | SUBCUTANEOUS | Status: AC
  Administered 2022-05-25: 6 mg via SUBCUTANEOUS
  Filled 2022-05-25: qty 0.6

## 2022-05-25 NOTE — Telephone Encounter (Signed)
Patient was seen at cancer center today They stated that she needed to start back on BP meds because BP is high  Patient came down to request it to be sent in   Patient does not currently take anything as LC has taken her off it   Amlodipine & Potassium??  Please advise

## 2022-05-25 NOTE — Patient Instructions (Signed)

## 2022-05-28 ENCOUNTER — Ambulatory Visit (HOSPITAL_BASED_OUTPATIENT_CLINIC_OR_DEPARTMENT_OTHER)
Admission: RE | Admit: 2022-05-28 | Discharge: 2022-05-28 | Disposition: A | Discharge status: Home / Self Care | Payer: BC Managed Care – PPO | Source: Ambulatory Visit | Attending: Medical | Admitting: Medical

## 2022-05-28 ENCOUNTER — Encounter (HOSPITAL_BASED_OUTPATIENT_CLINIC_OR_DEPARTMENT_OTHER): Payer: Self-pay

## 2022-05-28 ENCOUNTER — Ambulatory Visit (HOSPITAL_BASED_OUTPATIENT_CLINIC_OR_DEPARTMENT_OTHER)
Admission: RE | Admit: 2022-05-28 | Discharge: 2022-05-28 | Disposition: A | Discharge status: Home / Self Care | Payer: BC Managed Care – PPO | Source: Ambulatory Visit | Attending: Hematology & Oncology | Admitting: Hematology & Oncology

## 2022-05-28 ENCOUNTER — Ambulatory Visit: Payer: BC Managed Care – PPO | Admitting: Medical

## 2022-05-28 VITALS — BP 138/90 | HR 97 | Resp 18 | Ht 65.0 in | Wt 166.0 lb

## 2022-05-28 DIAGNOSIS — Z8701 Personal history of pneumonia (recurrent): Secondary | ICD-10-CM

## 2022-05-28 DIAGNOSIS — R000 Tachycardia, unspecified: Secondary | ICD-10-CM

## 2022-05-28 DIAGNOSIS — C259 Malignant neoplasm of pancreas, unspecified: Secondary | ICD-10-CM

## 2022-05-28 DIAGNOSIS — R232 Flushing: Secondary | ICD-10-CM | POA: Diagnosis not present

## 2022-05-28 DIAGNOSIS — J189 Pneumonia, unspecified organism: Secondary | ICD-10-CM | POA: Insufficient documentation

## 2022-05-28 DIAGNOSIS — J90 Pleural effusion, not elsewhere classified: Secondary | ICD-10-CM | POA: Diagnosis not present

## 2022-05-28 DIAGNOSIS — D649 Anemia, unspecified: Secondary | ICD-10-CM | POA: Diagnosis not present

## 2022-05-28 NOTE — Patient Instructions (Addendum)
1. Tachycardia early in exam but not on EKG. Minimal elevation on interview. Decide to EKG. Sinus rhythm. Hr 97. Nonspecific t wave abnormality. - EKG 12-Lead  2. Anemia, unspecified type. Anemia if drop can be reason for increased hr.  Mild low on review. Cbc   3. Malignant neoplasm of pancreas, unspecified location of malignancy ([place]). Strain of this on body may increase pulse. - Comp Met (CMET) -lipase  4. History of pneumonia Recent infection 2-3 weeks ago. With recent tachycardia minimal cause concern low grade fever increasing pulse. Will recheck cxr. - DG Chest 2 View; Future  5. Hot flashes. More than year since last menses.  [place]  Do recommend that when you feel warm or feverish to check temp to see if temp 100 or above   Follow up in 7-10 days or sooner if needed.

## 2022-05-28 NOTE — Progress Notes (Signed)
   Subjective:    Patient ID: Makayla Good, female    DOB: 05-May-1970, 52 y.o.   MRN: [id number]  HPI  Pt in for follow up.  Pt had chemotherapy last week. Pt has cancer.   Pt had 111 pulse last week. Pt feels little warm today. She if fanning herself in room. Not having cold sweats. More than a year since her menstrual cycle.  No chest pain. No sob.  Pt does not have any known fever. Did not check temperature. Pt notes 2 weeks ago or so she had pneumonia. She had to skip chemotherapy and then resumed.   Pt thinks that she is drinking enough fluids. But admits having hard time drinking 80 0z of water which she states oncology advise.  Pt does have anemia.   Review of Systems  Constitutional:  Negative for chills, fatigue and fever.  Respiratory:  Negative for choking, chest tightness, shortness of breath and wheezing.   Cardiovascular:  Negative for chest pain and palpitations.  Gastrointestinal:  Negative for anal bleeding.       Objective:   Physical Exam  General Mental Status- Alert. General Appearance- Not in acute distress.   Skin General: Color- Normal Color. Moisture- Normal Moisture.  Neck Carotid Arteries- Normal color. Moisture- Normal Moisture. No carotid bruits. No JVD.  Chest and Lung Exam Auscultation: Breath Sounds:-Normal.  Cardiovascular Auscultation:Rythm- Regular. Murmurs & Other Heart Sounds:Auscultation of the heart reveals- No Murmurs.  Abdomen Inspection:-Inspeection Normal. Palpation/Percussion:Note:No mass. Palpation and Percussion of the abdomen reveal- Non Tender, Non Distended + BS, no rebound or guarding.   Neurologic Cranial Nerve exam:- CN III-XII intact(No nystagmus), symmetric smile. Strength:- 5/5 equal and symmetric strength both upper and lower extremities.   Lower ext- calfs symmetric. No pedal edema. Negative homans signs.    Assessment & Plan:   Patient Instructions  1. Tachycardia Minimal elevation on  interview. Decide to EKG. Sinus rhythm. Hr 97. Nonspecific t wave abnormality. - EKG 12-Lead  2. Anemia, unspecified type. Anemia if drop can be reason for increased hr.  Mild low on review. Cbc   3. Malignant neoplasm of pancreas, unspecified location of malignancy ([place]). Strain of this on body may increase pulse. - Comp Met (CMET) -lipase  4. History of pneumonia Recent infection 2-3 weeks ago. With recent tachycardia minimal cause concern low grade fever increasing pulse. Will recheck cxr. - DG Chest 2 View; Future  5. Hot flashes. More than year since last menses.  [place]  Do recommend that when you feel warm or feverish to check temp to see if temp 100 or above   Follow up in 7-10 days or sooner if needed.   Mackie Pai, PA-C

## 2022-05-29 ENCOUNTER — Telehealth: Payer: Self-pay | Admitting: *Deleted

## 2022-05-29 ENCOUNTER — Encounter: Payer: Self-pay | Admitting: Hematology & Oncology

## 2022-05-29 LAB — COMPREHENSIVE METABOLIC PANEL
ALT: 24 U/L (ref 0–35)
AST: 30 U/L (ref 0–37)
Albumin: 3.5 g/dL (ref 3.5–5.2)
Alkaline Phosphatase: 267 U/L — ABNORMAL HIGH (ref 39–117)
BUN: 10 mg/dL (ref 6–23)
CO2: 23 mEq/L (ref 19–32)
Calcium: 8.8 mg/dL (ref 8.4–10.5)
Chloride: 100 mEq/L (ref 96–112)
Creatinine, Ser: 0.42 mg/dL (ref 0.40–1.20)
GFR: 113.15 mL/min (ref >60.00)
Glucose, Bld: 203 mg/dL — ABNORMAL HIGH (ref 70–99)
Potassium: 3.3 mEq/L — ABNORMAL LOW (ref 3.5–5.1)
Sodium: 139 mEq/L (ref 135–145)
Total Bilirubin: 0.5 mg/dL (ref 0.2–1.2)
Total Protein: 7 g/dL (ref 6.0–8.3)

## 2022-05-29 LAB — LIPASE: Lipase: 3 U/L — ABNORMAL LOW (ref 11.0–59.0)

## 2022-05-29 LAB — CBC WITH DIFFERENTIAL/PLATELET
Basophils Relative: 0.4 % (ref 0.0–3.0)
Eosinophils Relative: 0 % (ref 0.0–5.0)
HCT: 30.3 % — ABNORMAL LOW (ref 36.0–46.0)
Hemoglobin: 10.1 g/dL — ABNORMAL LOW (ref 12.0–15.0)
Lymphocytes Relative: 2 % — ABNORMAL LOW (ref 12.0–46.0)
MCHC: 33.4 g/dL (ref 30.0–36.0)
MCV: 95.3 fl (ref 78.0–100.0)
Monocytes Relative: 2 % — ABNORMAL LOW (ref 3.0–12.0)
Neutrophils Relative %: 96 % — ABNORMAL HIGH (ref 43.0–77.0)
Platelets: 188 10*3/uL (ref 150.0–400.0)
RBC: 3.18 Mil/uL — ABNORMAL LOW (ref 3.87–5.11)
RDW: 18.8 % — ABNORMAL HIGH (ref 11.5–15.5)
WBC: 30.5 10*3/uL (ref 4.0–10.5)

## 2022-05-29 LAB — FOLLICLE STIMULATING HORMONE: FSH: 53.1 m[IU]/mL

## 2022-05-29 NOTE — Telephone Encounter (Signed)
Cristie Hem at [facility] lab said she is still working on the diff and will be doing a smear on this specimen based on below result and will show up later in EPIC.  CRITICAL VALUE STICKER  CRITICAL VALUE:  WBC -- 30.5  RECEIVER (on-site recipient of call): Kelle Darting, [place] NOTIFIED:  9:39, 05/29/22  MESSENGER (representative from lab): Shari Prows.  MD NOTIFIED: Mackie Pai, PA-C  TIME OF NOTIFICATION: 9:40am  RESPONSE:

## 2022-05-31 NOTE — Telephone Encounter (Signed)
Pt had visit with [facility]

## 2022-06-03 DIAGNOSIS — C259 Malignant neoplasm of pancreas, unspecified: Secondary | ICD-10-CM | POA: Diagnosis not present

## 2022-06-04 ENCOUNTER — Other Ambulatory Visit: Payer: Self-pay | Admitting: Family Medicine

## 2022-06-04 ENCOUNTER — Other Ambulatory Visit: Payer: Self-pay | Admitting: Hematology & Oncology

## 2022-06-04 DIAGNOSIS — J029 Acute pharyngitis, unspecified: Secondary | ICD-10-CM

## 2022-06-06 ENCOUNTER — Encounter: Payer: Self-pay | Admitting: Family

## 2022-06-06 ENCOUNTER — Encounter: Payer: Self-pay | Admitting: *Deleted

## 2022-06-06 ENCOUNTER — Inpatient Hospital Stay: Payer: BC Managed Care – PPO

## 2022-06-06 ENCOUNTER — Inpatient Hospital Stay: Payer: BC Managed Care – PPO | Admitting: Licensed Clinical Social Worker

## 2022-06-06 ENCOUNTER — Inpatient Hospital Stay: Payer: BC Managed Care – PPO | Admitting: Family

## 2022-06-06 VITALS — BP 142/89 | HR 81 | Resp 17

## 2022-06-06 VITALS — BP 126/84 | HR 108 | Temp 98.4°F | Resp 18 | Wt 166.0 lb

## 2022-06-06 DIAGNOSIS — D509 Iron deficiency anemia, unspecified: Secondary | ICD-10-CM | POA: Diagnosis not present

## 2022-06-06 DIAGNOSIS — C7951 Secondary malignant neoplasm of bone: Secondary | ICD-10-CM

## 2022-06-06 DIAGNOSIS — C787 Secondary malignant neoplasm of liver and intrahepatic bile duct: Secondary | ICD-10-CM | POA: Diagnosis not present

## 2022-06-06 DIAGNOSIS — F419 Anxiety disorder, unspecified: Secondary | ICD-10-CM | POA: Diagnosis not present

## 2022-06-06 DIAGNOSIS — C259 Malignant neoplasm of pancreas, unspecified: Secondary | ICD-10-CM

## 2022-06-06 DIAGNOSIS — F418 Other specified anxiety disorders: Secondary | ICD-10-CM

## 2022-06-06 DIAGNOSIS — Z5111 Encounter for antineoplastic chemotherapy: Secondary | ICD-10-CM | POA: Diagnosis not present

## 2022-06-06 DIAGNOSIS — F32A Depression, unspecified: Secondary | ICD-10-CM | POA: Diagnosis not present

## 2022-06-06 DIAGNOSIS — J189 Pneumonia, unspecified organism: Secondary | ICD-10-CM

## 2022-06-06 DIAGNOSIS — Z79899 Other long term (current) drug therapy: Secondary | ICD-10-CM | POA: Diagnosis not present

## 2022-06-06 DIAGNOSIS — D500 Iron deficiency anemia secondary to blood loss (chronic): Secondary | ICD-10-CM

## 2022-06-06 LAB — CMP (CANCER CENTER ONLY)
ALT: 14 U/L (ref 0–44)
AST: 19 U/L (ref 15–41)
Albumin: 3.5 g/dL (ref 3.5–5.0)
Alkaline Phosphatase: 154 U/L — ABNORMAL HIGH (ref 38–126)
Anion gap: 10 (ref 5–15)
BUN: 8 mg/dL (ref 6–20)
CO2: 28 mmol/L (ref 22–32)
Calcium: 9 mg/dL (ref 8.9–10.3)
Chloride: 106 mmol/L (ref 98–111)
Creatinine: 0.45 mg/dL (ref 0.44–1.00)
GFR, Estimated: >60 mL/min (ref >60)
Glucose, Bld: 175 mg/dL — ABNORMAL HIGH (ref 70–99)
Potassium: 3 mmol/L — ABNORMAL LOW (ref 3.5–5.1)
Sodium: 144 mmol/L (ref 135–145)
Total Bilirubin: 0.4 mg/dL (ref 0.3–1.2)
Total Protein: 6.3 g/dL — ABNORMAL LOW (ref 6.5–8.1)

## 2022-06-06 LAB — IRON AND IRON BINDING CAPACITY (CC-WL,HP ONLY)
Iron: 99 ug/dL (ref 28–170)
Saturation Ratios: 42 % — ABNORMAL HIGH (ref 10.4–31.8)
TIBC: 237 ug/dL — ABNORMAL LOW (ref 250–450)
UIBC: 138 ug/dL — ABNORMAL LOW (ref 148–442)

## 2022-06-06 LAB — CBC WITH DIFFERENTIAL (CANCER CENTER ONLY)
Abs Immature Granulocytes: 0.06 10*3/uL (ref 0.00–0.07)
Basophils Absolute: 0 10*3/uL (ref 0.0–0.1)
Basophils Relative: 1 %
Eosinophils Absolute: 0.2 10*3/uL (ref 0.0–0.5)
Eosinophils Relative: 5 %
HCT: 29.9 % — ABNORMAL LOW (ref 36.0–46.0)
Hemoglobin: 9.2 g/dL — ABNORMAL LOW (ref 12.0–15.0)
Immature Granulocytes: 2 %
Lymphocytes Relative: 8 %
Lymphs Abs: 0.3 10*3/uL — ABNORMAL LOW (ref 0.7–4.0)
MCH: 31.4 pg (ref 26.0–34.0)
MCHC: 30.8 g/dL (ref 30.0–36.0)
MCV: 102 fL — ABNORMAL HIGH (ref 80.0–100.0)
Monocytes Absolute: 0.4 10*3/uL (ref 0.1–1.0)
Monocytes Relative: 9 %
Neutro Abs: 3.1 10*3/uL (ref 1.7–7.7)
Neutrophils Relative %: 75 %
Platelet Count: 181 10*3/uL (ref 150–400)
RBC: 2.93 MIL/uL — ABNORMAL LOW (ref 3.87–5.11)
RDW: 17.8 % — ABNORMAL HIGH (ref 11.5–15.5)
WBC Count: 4 10*3/uL (ref 4.0–10.5)
nRBC: 0 % (ref 0.0–0.2)

## 2022-06-06 LAB — RETICULOCYTES
Immature Retic Fract: 19.5 % — ABNORMAL HIGH (ref 2.3–15.9)
RBC.: 2.95 MIL/uL — ABNORMAL LOW (ref 3.87–5.11)
Retic Count, Absolute: 120.7 10*3/uL (ref 19.0–186.0)
Retic Ct Pct: 4.1 % — ABNORMAL HIGH (ref 0.4–3.1)

## 2022-06-06 LAB — FERRITIN: Ferritin: 1133 ng/mL — ABNORMAL HIGH (ref 11–307)

## 2022-06-06 LAB — LACTATE DEHYDROGENASE: LDH: 275 U/L — ABNORMAL HIGH (ref 98–192)

## 2022-06-06 IMAGING — CR DG CHEST 2V
2 series · 2 of 2 positions shown · non-contrast
Comparison: January 05, 2019.

CLINICAL DATA: Chest pain.

EXAM:
CHEST - 2 VIEW

[w chest pa]
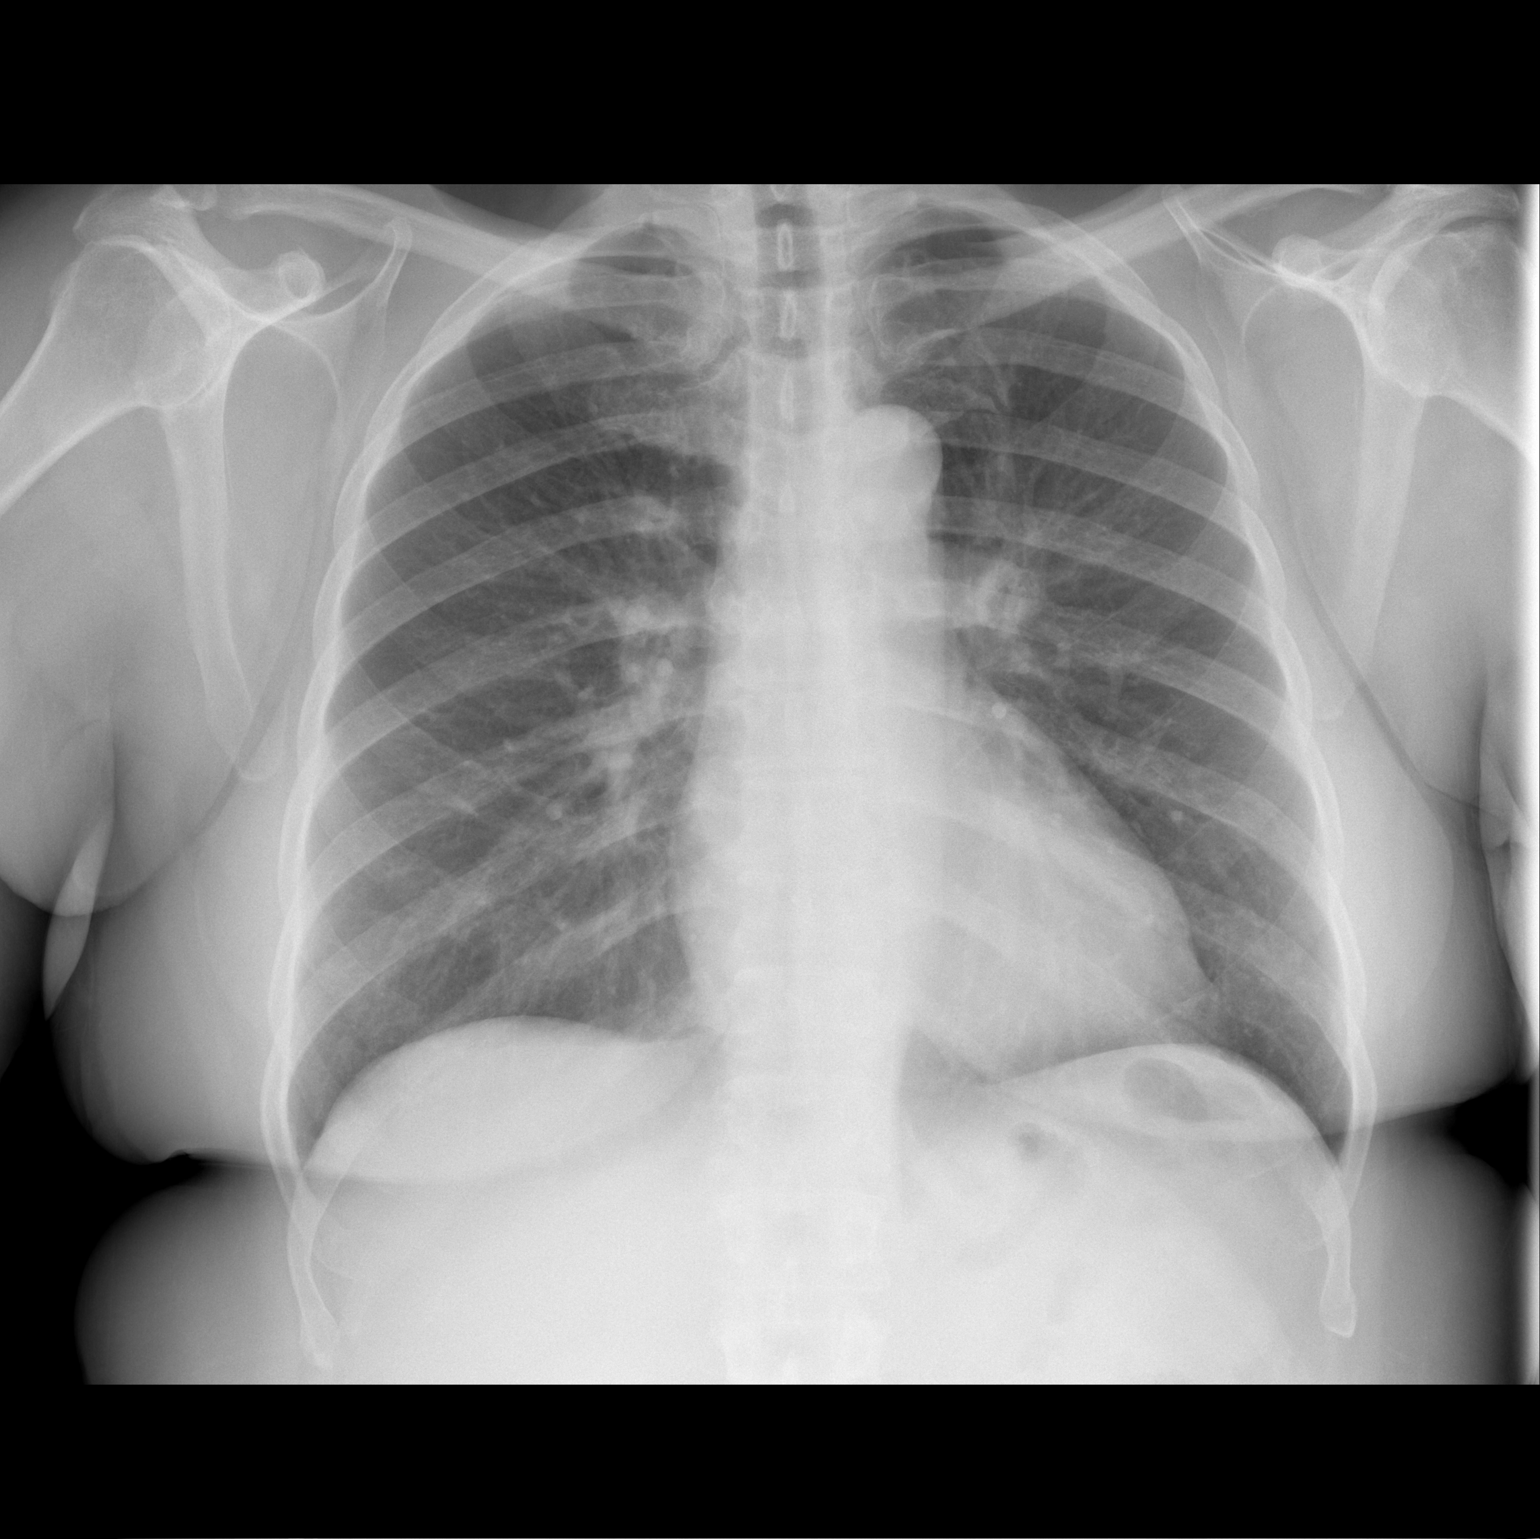

[w chest lat]
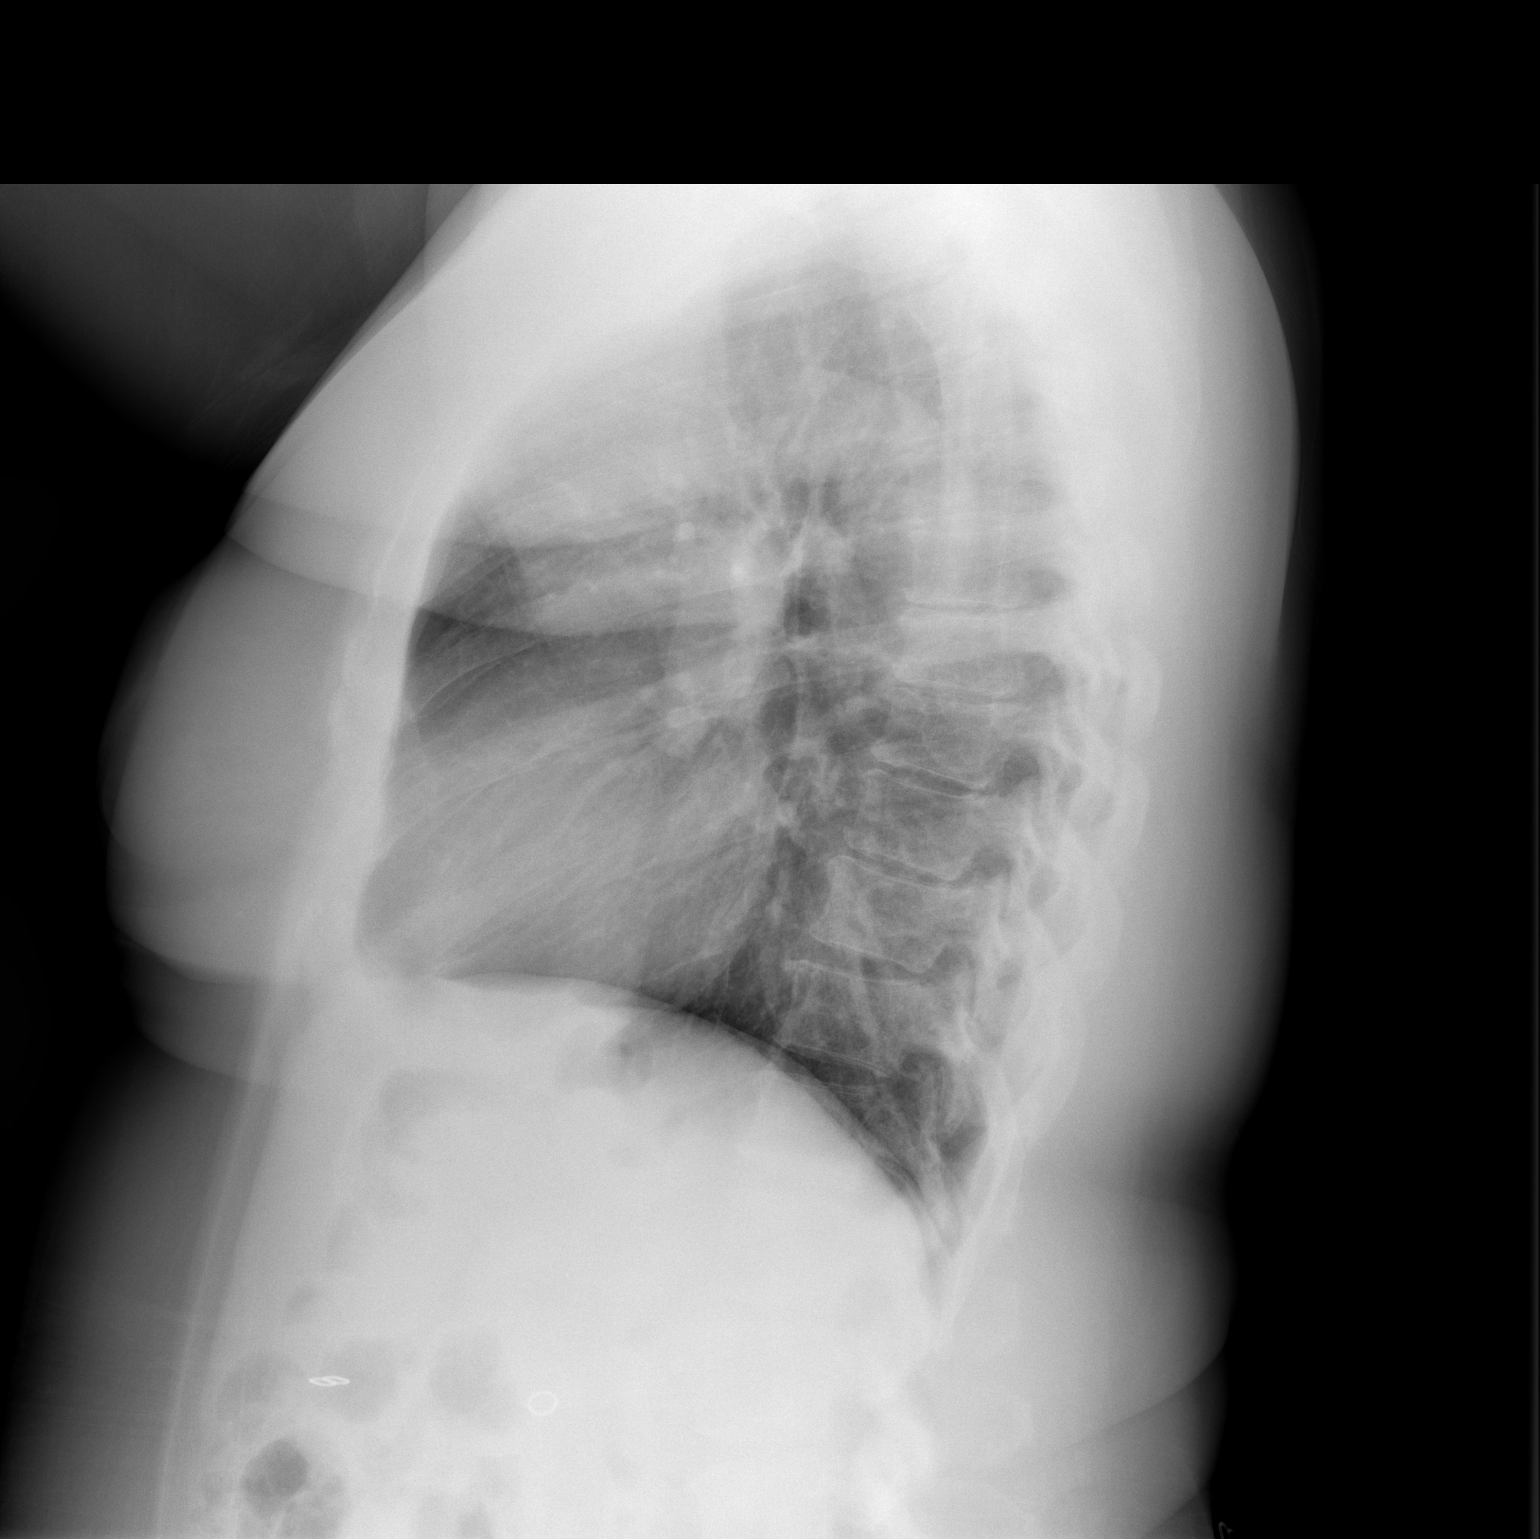

[2 of 2 positions shown; findings below may reference images not displayed]

FINDINGS: The heart size and mediastinal contours are within normal limits.
Both lungs are clear. The visualized skeletal structures are
unremarkable.
IMPRESSION: No active cardiopulmonary disease.

## 2022-06-06 MED ORDER — PALONOSETRON HCL INJECTION 0.25 MG/5ML
0.2500 mg | Freq: Once | INTRAVENOUS | Status: AC
  Administered 2022-06-06: 0.25 mg via INTRAVENOUS

## 2022-06-06 MED ORDER — ATROPINE SULFATE 1 MG/ML IV SOLN
0.5000 mg | Freq: Once | INTRAVENOUS | Status: AC | PRN
  Administered 2022-06-06: 0.5 mg via INTRAVENOUS
  Filled 2022-06-06: qty 1

## 2022-06-06 MED ORDER — SODIUM CHLORIDE 0.9 % IV SOLN
400.0000 mg/m2 | Freq: Once | INTRAVENOUS | Status: AC
  Administered 2022-06-06: 812 mg via INTRAVENOUS
  Filled 2022-06-06: qty 40.6

## 2022-06-06 MED ORDER — SODIUM CHLORIDE 0.9 % IV SOLN
150.0000 mg | Freq: Once | INTRAVENOUS | Status: AC
  Administered 2022-06-06: 150 mg via INTRAVENOUS
  Filled 2022-06-06: qty 150

## 2022-06-06 MED ORDER — LORAZEPAM 0.5 MG PO TABS: 0.5000 mg | ORAL_TABLET | Freq: Three times a day (TID) | ORAL | 0 refills | Status: DC | PRN

## 2022-06-06 MED ORDER — SODIUM CHLORIDE 0.9 % IV SOLN
120.0000 mg/m2 | Freq: Once | INTRAVENOUS | Status: AC
  Administered 2022-06-06: 240 mg via INTRAVENOUS
  Filled 2022-06-06: qty 10

## 2022-06-06 MED ORDER — OXALIPLATIN CHEMO INJECTION 100 MG/20ML
140.0000 mg | Freq: Once | INTRAVENOUS | Status: AC
  Administered 2022-06-06: 140 mg via INTRAVENOUS
  Filled 2022-06-06: qty 20

## 2022-06-06 MED ORDER — ESCITALOPRAM OXALATE 10 MG PO TABS: 10.0000 mg | ORAL_TABLET | Freq: Every day | ORAL | 2 refills | Status: DC

## 2022-06-06 MED ORDER — DEXTROSE 5 % IV SOLN: Freq: Once | INTRAVENOUS | Status: AC

## 2022-06-06 MED ORDER — SODIUM CHLORIDE 0.9 % IV SOLN
10.0000 mg | Freq: Once | INTRAVENOUS | Status: AC
  Administered 2022-06-06: 10 mg via INTRAVENOUS
  Filled 2022-06-06: qty 10

## 2022-06-06 MED ORDER — SODIUM CHLORIDE 0.9% FLUSH: 10.0000 mL | INTRAVENOUS | Status: DC | PRN

## 2022-06-06 MED ORDER — SODIUM CHLORIDE 0.9 % IV SOLN
1920.0000 mg/m2 | INTRAVENOUS | Status: DC
  Administered 2022-06-06: 3900 mg via INTRAVENOUS
  Filled 2022-06-06: qty 78

## 2022-06-06 NOTE — Progress Notes (Signed)
Hematology and Oncology Follow Up Visit  Makayla Good [id number] 1970/05/24 52 y.o. 06/06/2022   Principle Diagnosis:  Metastatic adenocarcinoma of the pancreas-liver metastasis -- NO actionable mutation   Current Therapy:        FOLFIRINOX -s/p cycle #2 - start on 04/02/2022 XRT to spine -- completed on 03/23/2022   Interim History:  [name] is here today for follow-up and treatment. She is doing well but has noted recent anxiety and depression due to her health and cancer diagnosis. She denies feelings of self harm or harming others. She also has had some episodes of panic and would like to try something to help her calm her thoughts of anxiety.  No issue with infection. No fever, chills, n/v, cough, rash, dizziness, SOB, chest pain, palpitations, abdominal pain or changes in bowel or bladder habits.  No swelling in her extremities.  She notes numbness and tingling in his right toes and sciatica in the right leg.  Appetite and hydration are good. Weight is stable at 166 lbs.  She has not noted any blood loss. No bruising or petechiae.  Last CA 19-9 was down to 75. Today's result is pending.   ECOG Performance Status: 1 - Symptomatic but completely ambulatory  Medications:  Allergies as of 06/06/2022   No Known Allergies      Medication List        Accurate as of June 06, 2022 10:02 AM. If you have any questions, ask your nurse or doctor.          (feeding supplement) PROSource Plus liquid Take 30 mLs by mouth 3 (three) times daily between meals.   Ensure Max Protein Liqd Take 330 mLs (11 oz total) by mouth daily.   acetaminophen 325 MG tablet Commonly known as: TYLENOL Take 2 tablets (650 mg total) by mouth every 6 (six) hours as needed for mild pain (or Fever >/= 101).   cyclobenzaprine 10 MG tablet Commonly known as: FLEXERIL TAKE 1 TABLET BY MOUTH AT BEDTIME AS NEEDED FOR MUSCLE SPASMS   dexamethasone 4 MG tablet Commonly known as:  DECADRON Take 2 tablets (8 mg total) by mouth daily. Take 2 tablets daily x 3 days starting the day after chemotherapy. Take with food.   fluticasone 50 MCG/ACT nasal spray Commonly known as: FLONASE SPRAY 2 SPRAYS INTO EACH NOSTRIL EVERY DAY   HYDROcodone bit-homatropine 5-1.5 MG/5ML syrup Commonly known as: HYCODAN Take 5 mLs by mouth every 4 (four) hours as needed for cough.   Ipratropium-Albuterol 20-100 MCG/ACT Aers respimat Commonly known as: COMBIVENT Inhale 1 puff into the lungs 3 (three) times daily for 5 days, THEN 1 puff every 6 (six) hours as needed for wheezing. Start taking on: May 12, 2022   Klor-Con M20 20 MEQ tablet Generic drug: potassium chloride SA TAKE 2 TABLETS BY MOUTH DAILY   lidocaine-prilocaine cream Commonly known as: EMLA Apply to affected area once   loperamide 2 MG capsule Commonly known as: IMODIUM Take 2 tabs by mouth with first loose stool, then 1 tab with each additional loose stool as needed. Do not exceed 8 tabs in a 24-hour period   omeprazole 40 MG capsule Commonly known as: PRILOSEC Take 1 capsule (40 mg total) by mouth daily.   ondansetron 8 MG tablet Commonly known as: Zofran Take 1 tablet (8 mg total) by mouth every 8 (eight) hours as needed for nausea or vomiting. Start on the third day after cisplatin   Oxycodone HCl 10 MG Tabs  Take 1 tablet (10 mg total) by mouth every 6 (six) hours as needed. What changed: reasons to take this   prochlorperazine 10 MG tablet Commonly known as: COMPAZINE Take 1 tablet (10 mg total) by mouth every 6 (six) hours as needed for nausea or vomiting (Nausea or vomiting).   temazepam 15 MG capsule Commonly known as: RESTORIL Take 1 capsule (15 mg total) by mouth at bedtime as needed for sleep.        Allergies: No Known Allergies  Past Medical History, Surgical history, Social history, and Family History were reviewed and updated.  Review of Systems: All other 10 point review of systems  is negative.   Physical Exam:  vitals were not taken for this visit.   Wt Readings from Last 3 Encounters:  05/28/22 166 lb (75.3 kg)  05/23/22 167 lb 1.3 oz (75.8 kg)  05/17/22 168 lb 6.4 oz (76.4 kg)    Ocular: Sclerae unicteric, pupils equal, round and reactive to light Ear-nose-throat: Oropharynx clear, dentition fair Lymphatic: No cervical or supraclavicular adenopathy Lungs no rales or rhonchi, good excursion bilaterally Heart regular rate and rhythm, no murmur appreciated Abd soft, nontender, positive bowel sounds MSK no focal spinal tenderness, no joint edema Neuro: non-focal, well-oriented, appropriate affect Breasts: Deferred   Lab Results  Component Value Date   WBC 4.0 06/06/2022   HGB 9.2 (L) 06/06/2022   HCT 29.9 (L) 06/06/2022   MCV 102.0 (H) 06/06/2022   PLT 181 06/06/2022   Lab Results  Component Value Date   FERRITIN 2,874 (H) 05/23/2022   IRON 44 05/23/2022   TIBC 160 (L) 05/23/2022   UIBC 116 05/23/2022   IRONPCTSAT 28 05/23/2022   Lab Results  Component Value Date   RETICCTPCT 4.1 (H) 06/06/2022   RBC 2.93 (L) 06/06/2022   RBC 2.95 (L) 06/06/2022   No results found for: "KPAFRELGTCHN", "LAMBDASER", "KAPLAMBRATIO" No results found for: "IGGSERUM", "IGA", "IGMSERUM" No results found for: "TOTALPROTELP", "ALBUMINELP", "A1GS", "A2GS", "BETS", "BETA2SER", "GAMS", "MSPIKE", "SPEI"   Chemistry      Component Value Date/Time   NA 139 05/28/2022 1623   K 3.3 (L) 05/28/2022 1623   CL 100 05/28/2022 1623   CO2 23 05/28/2022 1623   BUN 10 05/28/2022 1623   CREATININE 0.42 05/28/2022 1623   CREATININE 0.36 (L) 05/23/2022 0830   CREATININE 0.79 12/08/2021 1420      Component Value Date/Time   CALCIUM 8.8 05/28/2022 1623   ALKPHOS 267 (H) 05/28/2022 1623   AST 30 05/28/2022 1623   AST 22 05/23/2022 0830   ALT 24 05/28/2022 1623   ALT 8 05/23/2022 0830   BILITOT 0.5 05/28/2022 1623   BILITOT 0.8 05/23/2022 0830       Impression and Plan: Ms.  Makayla Good is a very pleasant 52 yo African American female with metastatic pancreatic adenocarcinoma. CA 19-9 last visit was down to 79! So far she is tolerating treatment with FOLFIRINOX nicely. We will proceed with cycle 4 today per Dr. [name].  We will repeat her CT scans several days prior to her follow-up with Dr. [name] in 3 weeks.  We will get her onto daily Lexapro for anxiety and depression as well as Ativan PRN for panic attacks. Side effect profile reviewed with patient. She will contact our office with any questions or concerns or go to the ED in the event of an emergency.   Lottie Dawson, NP 2/14/202410:02 AM

## 2022-06-06 NOTE — Progress Notes (Signed)
[facility] CSW Progress Note  Holiday representative met with patient to assess needs.  She said her brother had her Advance Directives and was going to bring them when he picked her up later today.  Unfortunately, there were no witnesses available for the document.  Debroah Baller said she would have it notarized at her work and would bring it back on Friday to be scanned into her medical record.    Rodman Pickle Anelise Staron, LCSW

## 2022-06-07 ENCOUNTER — Encounter: Payer: Self-pay | Admitting: Hematology & Oncology

## 2022-06-07 NOTE — Progress Notes (Signed)
Patient will need scans prior to the next appointment.   Oncology Nurse Navigator Documentation     06/06/2022   10:15 AM  Oncology Nurse Navigator Flowsheets  Navigator Follow Up Date: 06/22/2022  Navigator Follow Up Reason: Scan Review  Navigator Location [facility]  Navigator Encounter Type Treatment;Appt/Treatment Plan Review  Patient Visit Type MedOnc  Treatment Phase Active Tx  Barriers/Navigation Needs Coordination of Care;Education  Interventions Psycho-Social Support  Acuity Level 2-Minimal Needs (1-2 Barriers Identified)  Support Groups/Services Friends and Family  Time Spent with Patient 15

## 2022-06-08 ENCOUNTER — Inpatient Hospital Stay: Payer: BC Managed Care – PPO

## 2022-06-08 VITALS — BP 125/84 | HR 98 | Temp 98.1°F

## 2022-06-08 DIAGNOSIS — C787 Secondary malignant neoplasm of liver and intrahepatic bile duct: Secondary | ICD-10-CM | POA: Diagnosis not present

## 2022-06-08 DIAGNOSIS — Z5111 Encounter for antineoplastic chemotherapy: Secondary | ICD-10-CM | POA: Diagnosis not present

## 2022-06-08 DIAGNOSIS — C259 Malignant neoplasm of pancreas, unspecified: Secondary | ICD-10-CM

## 2022-06-08 DIAGNOSIS — F419 Anxiety disorder, unspecified: Secondary | ICD-10-CM | POA: Diagnosis not present

## 2022-06-08 DIAGNOSIS — F32A Depression, unspecified: Secondary | ICD-10-CM | POA: Diagnosis not present

## 2022-06-08 DIAGNOSIS — Z79899 Other long term (current) drug therapy: Secondary | ICD-10-CM | POA: Diagnosis not present

## 2022-06-08 LAB — CANCER ANTIGEN 19-9: CA 19-9: 83 U/mL — ABNORMAL HIGH (ref 0–35)

## 2022-06-08 MED ORDER — HEPARIN SOD (PORK) LOCK FLUSH 100 UNIT/ML IV SOLN
500.0000 [IU] | Freq: Once | INTRAVENOUS | Status: AC | PRN
  Administered 2022-06-08: 500 [IU]

## 2022-06-08 MED ORDER — PEGFILGRASTIM-CBQV 6 MG/0.6ML ~~LOC~~ SOSY
6.0000 mg | PREFILLED_SYRINGE | Freq: Once | SUBCUTANEOUS | Status: AC
  Administered 2022-06-08: 6 mg via SUBCUTANEOUS
  Filled 2022-06-08: qty 0.6

## 2022-06-08 MED ORDER — SODIUM CHLORIDE 0.9% FLUSH
10.0000 mL | INTRAVENOUS | Status: DC | PRN
  Administered 2022-06-08: 10 mL

## 2022-06-11 ENCOUNTER — Telehealth: Payer: Self-pay

## 2022-06-11 NOTE — Telephone Encounter (Signed)
Received call from patient stating that she is expecting to return to work tomorrow, but that her employer is reporting they have not received the proper paperwork from [place].  FMLA paperwork from [place] found in pt's chart dated 03/2022. Per patient, we should've received additional forms from [facility] for her 04/2022 admission. The "Arab forms" team reports they have no records of receving this paperwork. Debroah Baller reports she will contact [employer] to have them fax the needed forms to this nursing station. dph

## 2022-06-21 ENCOUNTER — Other Ambulatory Visit: Payer: Self-pay | Admitting: Hematology & Oncology

## 2022-06-22 ENCOUNTER — Telehealth: Payer: Self-pay | Admitting: Family Medicine

## 2022-06-22 ENCOUNTER — Encounter (HOSPITAL_BASED_OUTPATIENT_CLINIC_OR_DEPARTMENT_OTHER): Payer: Self-pay

## 2022-06-22 ENCOUNTER — Ambulatory Visit (HOSPITAL_BASED_OUTPATIENT_CLINIC_OR_DEPARTMENT_OTHER)
Admission: RE | Admit: 2022-06-22 | Discharge: 2022-06-22 | Disposition: A | Discharge status: Home / Self Care | Payer: BC Managed Care – PPO | Source: Ambulatory Visit | Attending: Family | Admitting: Family

## 2022-06-22 ENCOUNTER — Inpatient Hospital Stay: Payer: BC Managed Care – PPO | Attending: Hematology & Oncology

## 2022-06-22 VITALS — BP 154/101 | HR 109 | Temp 99.2°F

## 2022-06-22 DIAGNOSIS — Z9221 Personal history of antineoplastic chemotherapy: Secondary | ICD-10-CM | POA: Insufficient documentation

## 2022-06-22 DIAGNOSIS — C7951 Secondary malignant neoplasm of bone: Secondary | ICD-10-CM | POA: Insufficient documentation

## 2022-06-22 DIAGNOSIS — C259 Malignant neoplasm of pancreas, unspecified: Secondary | ICD-10-CM | POA: Diagnosis not present

## 2022-06-22 DIAGNOSIS — C250 Malignant neoplasm of head of pancreas: Secondary | ICD-10-CM | POA: Insufficient documentation

## 2022-06-22 DIAGNOSIS — Z66 Do not resuscitate: Secondary | ICD-10-CM | POA: Diagnosis not present

## 2022-06-22 DIAGNOSIS — Z923 Personal history of irradiation: Secondary | ICD-10-CM | POA: Insufficient documentation

## 2022-06-22 DIAGNOSIS — C787 Secondary malignant neoplasm of liver and intrahepatic bile duct: Secondary | ICD-10-CM | POA: Diagnosis not present

## 2022-06-22 MED ORDER — HEPARIN SOD (PORK) LOCK FLUSH 100 UNIT/ML IV SOLN
500.0000 [IU] | Freq: Once | INTRAVENOUS | Status: AC
  Administered 2022-06-22: 500 [IU] via INTRAVENOUS

## 2022-06-22 MED ORDER — IOHEXOL 300 MG/ML  SOLN
100.0000 mL | Freq: Once | INTRAMUSCULAR | Status: AC | PRN
  Administered 2022-06-22: 100 mL via INTRAVENOUS

## 2022-06-22 MED ORDER — SODIUM CHLORIDE 0.9% FLUSH
10.0000 mL | Freq: Once | INTRAVENOUS | Status: AC
  Administered 2022-06-22: 10 mL via INTRAVENOUS

## 2022-06-22 NOTE — Telephone Encounter (Signed)
Pt called stating that he had her BP taken upstairs in the cancer center and it was higher than it normally was. Stats on visit notes are as follows:  154/101 Pulse: 109  She was wondering if she needed to go back onto the BP meds she was taken off of or if she would be ok to follow up with Dr. Etter Sjogren about this. Pt was scheduled for an appt on 3.4.24 @ 3:40p just in case. Please Advise.

## 2022-06-22 NOTE — Telephone Encounter (Signed)
Patient advised to take amlodipine and keep appt.  She has enough on hand and will start tonight.

## 2022-06-25 ENCOUNTER — Ambulatory Visit (INDEPENDENT_AMBULATORY_CARE_PROVIDER_SITE_OTHER): Payer: BC Managed Care – PPO | Admitting: Family Medicine

## 2022-06-25 VITALS — BP 124/90 | HR 125 | Temp 98.3°F | Resp 18 | Ht 65.0 in | Wt 171.0 lb

## 2022-06-25 DIAGNOSIS — C259 Malignant neoplasm of pancreas, unspecified: Secondary | ICD-10-CM | POA: Diagnosis not present

## 2022-06-25 DIAGNOSIS — I10 Essential (primary) hypertension: Secondary | ICD-10-CM | POA: Diagnosis not present

## 2022-06-25 MED ORDER — HYDROCHLOROTHIAZIDE 25 MG PO TABS: 25.0000 mg | ORAL_TABLET | Freq: Every day | ORAL | 3 refills | Status: DC

## 2022-06-25 NOTE — Patient Instructions (Signed)

## 2022-06-26 ENCOUNTER — Encounter: Payer: Self-pay | Admitting: *Deleted

## 2022-06-26 ENCOUNTER — Encounter: Payer: Self-pay | Admitting: Hematology & Oncology

## 2022-06-26 LAB — BASIC METABOLIC PANEL
BUN: 12 mg/dL (ref 6–23)
CO2: 29 mEq/L (ref 19–32)
Calcium: 10.6 mg/dL — ABNORMAL HIGH (ref 8.4–10.5)
Chloride: 102 mEq/L (ref 96–112)
Creatinine, Ser: 0.53 mg/dL (ref 0.40–1.20)
GFR: 106.92 mL/min (ref >60.00)
Glucose, Bld: 100 mg/dL — ABNORMAL HIGH (ref 70–99)
Potassium: 4.3 mEq/L (ref 3.5–5.1)
Sodium: 142 mEq/L (ref 135–145)

## 2022-06-26 NOTE — Progress Notes (Signed)
CT scan reviewed which shows mixed response, but overall progression.   Oncology Nurse Navigator Documentation     06/26/2022    7:30 AM  Oncology Nurse Navigator Flowsheets  Navigator Follow Up Date: 06/27/2022  Navigator Follow Up Reason: Follow-up Appointment  Navigator Location [facility]  Navigator Encounter Type Scan Review  Patient Visit Type MedOnc  Treatment Phase Active Tx  Barriers/Navigation Needs Coordination of Care;Education  Interventions None Required  Acuity Level 2-Minimal Needs (1-2 Barriers Identified)  Support Groups/Services Friends and Family  Time Spent with Patient 15

## 2022-06-27 ENCOUNTER — Inpatient Hospital Stay: Payer: BC Managed Care – PPO

## 2022-06-27 ENCOUNTER — Encounter: Payer: Self-pay | Admitting: Family Medicine

## 2022-06-27 ENCOUNTER — Inpatient Hospital Stay: Payer: BC Managed Care – PPO | Admitting: Family

## 2022-06-27 NOTE — Assessment & Plan Note (Signed)
Well controlled, no changes to meds. Encouraged heart healthy diet such as the DASH diet and exercise as tolerated.   Pt just started bp meds again

## 2022-06-27 NOTE — Progress Notes (Signed)
Established Patient Office Visit  Subjective   Patient ID: Makayla Good, female    DOB: April 18, 1971  Age: 52 y.o. MRN: [id number]  Chief Complaint  Patient presents with   Hypertension   Follow-up    HPI  Patient Active Problem List   Diagnosis Date Noted   Iron deficiency anemia due to chronic blood loss 05/12/2022   Sepsis ([place]) 05/11/2022   Pneumonia due to infectious organism 05/06/2022   Hypomagnesemia 05/06/2022   Genetic testing 03/29/2022   Pancreatic cancer ([place]) 03/06/2022   Metastatic cancer to spine ([place]) 03/05/2022   Leg pain, bilateral 03/05/2022   Insomnia 03/05/2022   Pancreatic adenocarcinoma ([place]) 03/01/2022   Varicose veins of right lower extremity with pain 12/08/2021   Multinodular goiter 10/25/2020   Anxiety 10/14/2020   Increased frequency of headaches 09/05/2020   Abnormal CT of the head 09/05/2020   Loud snoring 09/05/2020   Gastroesophageal reflux disease 05/05/2020   Hyperglycemia 05/05/2020   Lower extremity edema 09/22/2019   Lymphedema 09/22/2019   HTN (hypertension) 08/05/2017   Hypokalemia 08/05/2017   OSA (obstructive sleep apnea) 03/05/2017   Asthma 03/05/2017   Dizziness 02/08/2017   Preventative health care 02/08/2017   Abdominal pain 07/05/2016   RUQ pain 07/05/2016   Irregular periods 07/26/2014   Dysuria 02/04/2014   Severe obesity (BMI >= 40) ([place]) 10/21/2012   OA (osteoarthritis) of knee 06/04/2012   Cough 03/05/2012   Plantar fasciitis 09/04/2010   HYPOKALEMIA 07/01/2009   CHEST PAIN UNSPECIFIED 05/18/2009   DEPRESSION 03/07/2009   FATIGUE 03/07/2009   TOBACCO USER 12/03/2008   Morbid obesity ([place]) 05/21/2008   HLD (hyperlipidemia) 123456   DYSMETABOLIC SYNDROME X 123456   CONJUNCTIVITIS NOS 02/04/2007   VAGINITIS NOS 11/07/2006   FUNGAL DERMATITIS 10/09/2006   Benign essential HTN 10/09/2006   Past Medical History:  Diagnosis Date   Asthma    Eczema    Fibroid    H/O oophorectomy     Hypertension    Obesity    Pancreatic adenocarcinoma ([place]) 03/01/2022   Polycystic ovary    Left   Sleep apnea    no CPAP per MD   Past Surgical History:  Procedure Laterality Date   BACK SURGERY     19yr ago   BREAST BIOPSY Right 2019   fibroadenoma   CESAREAN SECTION  02/01/2005   CESAREAN SECTION  2001   IR IMAGING GUIDED PORT INSERTION  03/07/2022   LAPAROSCOPIC GASTRIC SLEEVE RESECTION N/A 07/29/2017   Procedure: LAPAROSCOPIC GASTRIC SLEEVE RESECTION WITH UPPER ENDO ;  Surgeon: CClovis Riley MD;  Location: [facility];  Service: General;  Laterality: N/A;   OOPHORECTOMY     Rt.removed   TUBAL LIGATION  02/01/2005   Social History   Tobacco Use   Smoking status: Former    Packs/day: 0.50    Years: 30.00    Total pack years: 15.00    Types: Cigarettes, E-cigarettes    Start date: 04/28/2020    Quit date: 2022    Years since quitting: 2.1   Smokeless tobacco: Never   Tobacco comments:    VAPES  Vaping Use   Vaping Use: Every day   Substances: Nicotine  Substance Use Topics   Alcohol use: Yes    Comment: occasional   Drug use: No   Social History   Socioeconomic History   Marital status: Married    Spouse name: Not on file   Number of children: Not on file  Years of education: 55   Highest education level: Not on file  Occupational History   Occupation: release liens    Employer: [employer]  Tobacco Use   Smoking status: Former    Packs/day: 0.50    Years: 30.00    Total pack years: 15.00    Types: Cigarettes, E-cigarettes    Start date: 04/28/2020    Quit date: 2022    Years since quitting: 2.1   Smokeless tobacco: Never   Tobacco comments:    VAPES  Vaping Use   Vaping Use: Every day   Substances: Nicotine  Substance and Sexual Activity   Alcohol use: Yes    Comment: occasional   Drug use: No   Sexual activity: Not Currently    Partners: Male    Birth control/protection: Surgical    Comment: Tubal/been with same person over 20 years   Other Topics Concern   Not on file  Social History Narrative   Exercise--gym, every other day--- at least 3 days a week   Social Determinants of Health   Financial Resource Strain: Low Risk  (02/26/2022)   Overall Financial Resource Strain (CARDIA)    Difficulty of Paying Living Expenses: Not hard at all  Food Insecurity: No Food Insecurity (05/14/2022)   Hunger Vital Sign    Worried About Running Out of Food in the Last Year: Never true    [place] in the Last Year: Never true  Transportation Needs: No Transportation Needs (05/14/2022)   PRAPARE - Hydrologist (Medical): No    Lack of Transportation (Non-Medical): No  Physical Activity: Not on file  Stress: Not on file  Social Connections: Not on file  Intimate Partner Violence: Not At Risk (05/07/2022)   Humiliation, Afraid, Rape, and Kick questionnaire    Fear of Current or Ex-Partner: No    Emotionally Abused: No    Physically Abused: No    Sexually Abused: No   Family Status  Relation Name Status   Mother  Alive   Father  Alive   Sister  Alive   Sister  Alive   Sister  Alive   Sister  Alive   Mat Aunt  Deceased       alzheimers dementia   Mat Uncle  Alive   Mat Uncle  Deceased   Mat Uncle  Deceased   Mat Uncle  Deceased   Mat Uncle  Deceased   Brother  Alive   Brother  Alive   Brother  Alive   Brother  Alive   Brother  Alive   Other  (Not Specified)   Neg Hx  (Not Specified)   Family History  Problem Relation Age of Onset   Hypertension Mother    Schizophrenia Mother    Dementia Mother    Mental illness Mother        schizophrenia, dementia   Hypertension Father    Coronary artery disease Father        Stent   Alzheimer's disease Father    Hypertension Sister    Hypertension Sister    Arthritis Sister        rheumatoid   Dementia Maternal Aunt    Kidney disease Other    Colon cancer Neg Hx    Stomach cancer Neg Hx    Pancreatic cancer Neg Hx    Esophageal  cancer Neg Hx    Rectal cancer Neg Hx    Breast cancer Neg Hx  No Known Allergies    ROS    Objective:     BP (!) 124/90 (BP Location: Left Arm, Patient Position: Sitting, Cuff Size: Normal)   Pulse (!) 125   Temp 98.3 F (36.8 C) (Oral)   Resp 18   Ht '5\' 5"'$  (1.651 m)   Wt 171 lb (77.6 kg)   LMP 07/27/2020 (Within Days)   SpO2 99%   BMI 28.46 kg/m  BP Readings from Last 3 Encounters:  06/25/22 (!) 124/90  06/22/22 (!) 154/101  06/08/22 125/84   Wt Readings from Last 3 Encounters:  06/25/22 171 lb (77.6 kg)  06/06/22 166 lb 0.6 oz (75.3 kg)  05/28/22 166 lb (75.3 kg)   SpO2 Readings from Last 3 Encounters:  06/25/22 99%  06/22/22 100%  06/08/22 99%      Physical Exam   Results for orders placed or performed in visit on [id number]  Basic metabolic panel  Result Value Ref Range   Sodium 142 135 - 145 mEq/L   Potassium 4.3 3.5 - 5.1 mEq/L   Chloride 102 96 - 112 mEq/L   CO2 29 19 - 32 mEq/L   Glucose, Bld 100 (H) 70 - 99 mg/dL   BUN 12 6 - 23 mg/dL   Creatinine, Ser 0.53 0.40 - 1.20 mg/dL   GFR 106.92 >60.00 mL/min   Calcium 10.6 (H) 8.4 - 10.5 mg/dL    Last CBC Lab Results  Component Value Date   WBC 4.0 06/06/2022   HGB 9.2 (L) 06/06/2022   HCT 29.9 (L) 06/06/2022   MCV 102.0 (H) 06/06/2022   MCH 31.4 06/06/2022   RDW 17.8 (H) 06/06/2022   PLT 181 [id number]   Last metabolic panel Lab Results  Component Value Date   GLUCOSE 100 (H) 06/25/2022   NA 142 06/25/2022   K 4.3 06/25/2022   CL 102 06/25/2022   CO2 29 06/25/2022   BUN 12 06/25/2022   CREATININE 0.53 06/25/2022   GFRNONAA >60 06/06/2022   CALCIUM 10.6 (H) 06/25/2022   PHOS 3.0 05/12/2022   PROT 6.3 (L) 06/06/2022   ALBUMIN 3.5 06/06/2022   BILITOT 0.4 06/06/2022   ALKPHOS 154 (H) 06/06/2022   AST 19 06/06/2022   ALT 14 06/06/2022   ANIONGAP 10 06/06/2022   Last lipids Lab Results  Component Value Date   CHOL 186 12/08/2021   HDL 54 12/08/2021   LDLCALC 112 (H)  12/08/2021   TRIG 102 12/08/2021   CHOLHDL 3.4 12/08/2021   Last hemoglobin A1c Lab Results  Component Value Date   HGBA1C 5.6 05/05/2020   Last thyroid functions Lab Results  Component Value Date   TSH 1.74 12/08/2021   T3TOTAL 106 10/31/2021   T4TOTAL 8.2 10/11/2020   Last vitamin D Lab Results  Component Value Date   VD25OH 39.67 02/13/2019   Last vitamin B12 and Folate Lab Results  Component Value Date   VITAMINB12 4,462 (H) 05/09/2022   FOLATE 14.0 05/09/2022      The 10-year ASCVD risk score (Arnett DK, et al., 2019) is: 6.2%    Assessment & Plan:   Problem List Items Addressed This Visit       Unprioritized   Pancreatic cancer ([place])    Per oncology      HTN (hypertension) - Primary    Well controlled, no changes to meds. Encouraged heart healthy diet such as the DASH diet and exercise as tolerated.   Pt just started bp meds again  Relevant Medications   hydrochlorothiazide (HYDRODIURIL) 25 MG tablet   Other Relevant Orders   Basic metabolic panel (Completed)    Return in about 3 months (around 09/25/2022) for hypertension.    [name], DO

## 2022-06-27 NOTE — Assessment & Plan Note (Signed)
Per oncology °

## 2022-06-28 ENCOUNTER — Emergency Department (HOSPITAL_COMMUNITY): Payer: BC Managed Care – PPO

## 2022-06-28 ENCOUNTER — Encounter (HOSPITAL_BASED_OUTPATIENT_CLINIC_OR_DEPARTMENT_OTHER): Payer: Self-pay

## 2022-06-28 ENCOUNTER — Inpatient Hospital Stay: Payer: BC Managed Care – PPO | Admitting: Hematology & Oncology

## 2022-06-28 ENCOUNTER — Other Ambulatory Visit: Payer: Self-pay

## 2022-06-28 ENCOUNTER — Inpatient Hospital Stay: Payer: BC Managed Care – PPO

## 2022-06-28 ENCOUNTER — Encounter: Payer: Self-pay | Admitting: *Deleted

## 2022-06-28 ENCOUNTER — Other Ambulatory Visit: Payer: Self-pay | Admitting: Family

## 2022-06-28 ENCOUNTER — Inpatient Hospital Stay (HOSPITAL_BASED_OUTPATIENT_CLINIC_OR_DEPARTMENT_OTHER)
Admission: EM | Admit: 2022-06-28 | Discharge: 2022-07-01 | DRG: 948 | Disposition: A | Discharge status: Home / Self Care | Payer: BC Managed Care – PPO | Attending: Family Medicine | Admitting: Family Medicine

## 2022-06-28 DIAGNOSIS — J301 Allergic rhinitis due to pollen: Secondary | ICD-10-CM

## 2022-06-28 DIAGNOSIS — C787 Secondary malignant neoplasm of liver and intrahepatic bile duct: Secondary | ICD-10-CM | POA: Diagnosis present

## 2022-06-28 DIAGNOSIS — M48061 Spinal stenosis, lumbar region without neurogenic claudication: Secondary | ICD-10-CM | POA: Diagnosis present

## 2022-06-28 DIAGNOSIS — I10 Essential (primary) hypertension: Secondary | ICD-10-CM | POA: Diagnosis present

## 2022-06-28 DIAGNOSIS — Z6827 Body mass index (BMI) 27.0-27.9, adult: Secondary | ICD-10-CM | POA: Diagnosis not present

## 2022-06-28 DIAGNOSIS — Z1152 Encounter for screening for COVID-19: Secondary | ICD-10-CM | POA: Diagnosis not present

## 2022-06-28 DIAGNOSIS — Z8249 Family history of ischemic heart disease and other diseases of the circulatory system: Secondary | ICD-10-CM

## 2022-06-28 DIAGNOSIS — D638 Anemia in other chronic diseases classified elsewhere: Secondary | ICD-10-CM | POA: Diagnosis present

## 2022-06-28 DIAGNOSIS — G893 Neoplasm related pain (acute) (chronic): Principal | ICD-10-CM | POA: Diagnosis present

## 2022-06-28 DIAGNOSIS — E876 Hypokalemia: Secondary | ICD-10-CM | POA: Diagnosis present

## 2022-06-28 DIAGNOSIS — Z820 Family history of epilepsy and other diseases of the nervous system: Secondary | ICD-10-CM

## 2022-06-28 DIAGNOSIS — D630 Anemia in neoplastic disease: Secondary | ICD-10-CM | POA: Diagnosis not present

## 2022-06-28 DIAGNOSIS — C7951 Secondary malignant neoplasm of bone: Secondary | ICD-10-CM

## 2022-06-28 DIAGNOSIS — K219 Gastro-esophageal reflux disease without esophagitis: Secondary | ICD-10-CM | POA: Diagnosis present

## 2022-06-28 DIAGNOSIS — F418 Other specified anxiety disorders: Secondary | ICD-10-CM

## 2022-06-28 DIAGNOSIS — Z818 Family history of other mental and behavioral disorders: Secondary | ICD-10-CM

## 2022-06-28 DIAGNOSIS — Z8261 Family history of arthritis: Secondary | ICD-10-CM | POA: Diagnosis not present

## 2022-06-28 DIAGNOSIS — Z923 Personal history of irradiation: Secondary | ICD-10-CM

## 2022-06-28 DIAGNOSIS — R1013 Epigastric pain: Secondary | ICD-10-CM

## 2022-06-28 DIAGNOSIS — Z95828 Presence of other vascular implants and grafts: Secondary | ICD-10-CM | POA: Diagnosis not present

## 2022-06-28 DIAGNOSIS — M545 Low back pain, unspecified: Secondary | ICD-10-CM | POA: Diagnosis present

## 2022-06-28 DIAGNOSIS — J45909 Unspecified asthma, uncomplicated: Secondary | ICD-10-CM | POA: Diagnosis not present

## 2022-06-28 DIAGNOSIS — C259 Malignant neoplasm of pancreas, unspecified: Secondary | ICD-10-CM | POA: Diagnosis not present

## 2022-06-28 DIAGNOSIS — C252 Malignant neoplasm of tail of pancreas: Secondary | ICD-10-CM

## 2022-06-28 DIAGNOSIS — C799 Secondary malignant neoplasm of unspecified site: Secondary | ICD-10-CM | POA: Diagnosis not present

## 2022-06-28 DIAGNOSIS — Z79899 Other long term (current) drug therapy: Secondary | ICD-10-CM | POA: Diagnosis not present

## 2022-06-28 DIAGNOSIS — Z87891 Personal history of nicotine dependence: Secondary | ICD-10-CM | POA: Diagnosis not present

## 2022-06-28 DIAGNOSIS — M5410 Radiculopathy, site unspecified: Secondary | ICD-10-CM

## 2022-06-28 DIAGNOSIS — E440 Moderate protein-calorie malnutrition: Secondary | ICD-10-CM | POA: Diagnosis not present

## 2022-06-28 DIAGNOSIS — M79604 Pain in right leg: Secondary | ICD-10-CM

## 2022-06-28 DIAGNOSIS — Z8507 Personal history of malignant neoplasm of pancreas: Secondary | ICD-10-CM | POA: Diagnosis not present

## 2022-06-28 DIAGNOSIS — F411 Generalized anxiety disorder: Secondary | ICD-10-CM | POA: Diagnosis present

## 2022-06-28 DIAGNOSIS — G4733 Obstructive sleep apnea (adult) (pediatric): Secondary | ICD-10-CM | POA: Diagnosis present

## 2022-06-28 LAB — CBC WITH DIFFERENTIAL/PLATELET
Abs Immature Granulocytes: 0.03 10*3/uL (ref 0.00–0.07)
Basophils Absolute: 0 10*3/uL (ref 0.0–0.1)
Basophils Relative: 1 %
Eosinophils Absolute: 0.1 10*3/uL (ref 0.0–0.5)
Eosinophils Relative: 2 %
HCT: 31.2 % — ABNORMAL LOW (ref 36.0–46.0)
Hemoglobin: 10 g/dL — ABNORMAL LOW (ref 12.0–15.0)
Immature Granulocytes: 0 %
Lymphocytes Relative: 7 %
Lymphs Abs: 0.5 10*3/uL — ABNORMAL LOW (ref 0.7–4.0)
MCH: 32.4 pg (ref 26.0–34.0)
MCHC: 32.1 g/dL (ref 30.0–36.0)
MCV: 101 fL — ABNORMAL HIGH (ref 80.0–100.0)
Monocytes Absolute: 1.1 10*3/uL — ABNORMAL HIGH (ref 0.1–1.0)
Monocytes Relative: 16 %
Neutro Abs: 5.4 10*3/uL (ref 1.7–7.7)
Neutrophils Relative %: 74 %
Platelets: 214 10*3/uL (ref 150–400)
RBC: 3.09 MIL/uL — ABNORMAL LOW (ref 3.87–5.11)
RDW: 16.9 % — ABNORMAL HIGH (ref 11.5–15.5)
WBC: 7.2 10*3/uL (ref 4.0–10.5)
nRBC: 0 % (ref 0.0–0.2)

## 2022-06-28 LAB — BASIC METABOLIC PANEL
Anion gap: 7 (ref 5–15)
BUN: 10 mg/dL (ref 6–20)
CO2: 27 mmol/L (ref 22–32)
Calcium: 9.1 mg/dL (ref 8.9–10.3)
Chloride: 102 mmol/L (ref 98–111)
Creatinine, Ser: 0.41 mg/dL — ABNORMAL LOW (ref 0.44–1.00)
GFR, Estimated: >60 mL/min (ref >60)
Glucose, Bld: 116 mg/dL — ABNORMAL HIGH (ref 70–99)
Potassium: 3.3 mmol/L — ABNORMAL LOW (ref 3.5–5.1)
Sodium: 136 mmol/L (ref 135–145)

## 2022-06-28 MED ORDER — POTASSIUM CHLORIDE CRYS ER 20 MEQ PO TBCR
40.0000 meq | EXTENDED_RELEASE_TABLET | Freq: Once | ORAL | Status: AC
  Administered 2022-06-29: 40 meq via ORAL
  Filled 2022-06-28: qty 2

## 2022-06-28 MED ORDER — GADOBUTROL 1 MMOL/ML IV SOLN
7.5000 mL | Freq: Once | INTRAVENOUS | Status: AC | PRN
  Administered 2022-06-30: 7.5 mL via INTRAVENOUS

## 2022-06-28 MED ORDER — KETOROLAC TROMETHAMINE 15 MG/ML IJ SOLN
15.0000 mg | Freq: Once | INTRAMUSCULAR | Status: AC
  Administered 2022-06-29: 15 mg via INTRAVENOUS
  Filled 2022-06-28: qty 1

## 2022-06-28 MED ORDER — KETOROLAC TROMETHAMINE 15 MG/ML IJ SOLN
15.0000 mg | Freq: Four times a day (QID) | INTRAMUSCULAR | Status: DC | PRN
  Administered 2022-06-29: 15 mg via INTRAVENOUS
  Filled 2022-06-28: qty 1

## 2022-06-28 MED ORDER — LORAZEPAM 1 MG PO TABS: 1.0000 mg | ORAL_TABLET | Freq: Four times a day (QID) | ORAL | Status: DC | PRN

## 2022-06-28 MED ORDER — HYDROMORPHONE HCL 1 MG/ML IJ SOLN
1.0000 mg | Freq: Once | INTRAMUSCULAR | Status: AC
  Administered 2022-06-28: 1 mg via INTRAVENOUS
  Filled 2022-06-28: qty 1

## 2022-06-28 MED ORDER — HYDROMORPHONE HCL 1 MG/ML IJ SOLN
1.0000 mg | INTRAMUSCULAR | Status: DC | PRN
  Administered 2022-06-29: 1 mg via INTRAVENOUS
  Filled 2022-06-28: qty 1

## 2022-06-28 MED ORDER — NALOXONE HCL 0.4 MG/ML IJ SOLN: 0.4000 mg | INTRAMUSCULAR | Status: DC | PRN

## 2022-06-28 MED ORDER — ACETAMINOPHEN 325 MG PO TABS: 650.0000 mg | ORAL_TABLET | Freq: Four times a day (QID) | ORAL | Status: DC | PRN

## 2022-06-28 MED ORDER — HYDROMORPHONE HCL 1 MG/ML IJ SOLN
2.0000 mg | Freq: Once | INTRAMUSCULAR | Status: AC
  Administered 2022-06-28: 2 mg via INTRAMUSCULAR
  Filled 2022-06-28: qty 2

## 2022-06-28 MED ORDER — LORAZEPAM 2 MG/ML IJ SOLN
1.0000 mg | Freq: Once | INTRAMUSCULAR | Status: AC
  Administered 2022-06-28: 1 mg via INTRAVENOUS
  Filled 2022-06-28: qty 1

## 2022-06-28 MED ORDER — ACETAMINOPHEN 650 MG RE SUPP: 650.0000 mg | Freq: Four times a day (QID) | RECTAL | Status: DC | PRN

## 2022-06-28 MED ORDER — HYDROMORPHONE HCL 1 MG/ML IJ SOLN: 1.0000 mg | Freq: Once | INTRAMUSCULAR | Status: DC

## 2022-06-28 MED ORDER — MELATONIN 3 MG PO TABS: 3.0000 mg | ORAL_TABLET | Freq: Every evening | ORAL | Status: DC | PRN

## 2022-06-28 MED ORDER — ONDANSETRON HCL 4 MG/2ML IJ SOLN: 4.0000 mg | Freq: Four times a day (QID) | INTRAMUSCULAR | Status: DC | PRN

## 2022-06-28 MED ORDER — LACTATED RINGERS IV BOLUS
500.0000 mL | Freq: Once | INTRAVENOUS | Status: AC
  Administered 2022-06-28: 500 mL via INTRAVENOUS

## 2022-06-28 NOTE — Progress Notes (Signed)
Patient was scheduled to come into the office today to discuss her recent scans which show progression and possible treatment options. She went to the ED this morning with uncontrolled pain and at this time there is a likely plan for admission. Appointments for today cancelled.   Will follow patient for post discharge needs and office follow up.   Oncology Nurse Navigator Documentation     06/26/2022    7:30 AM  Oncology Nurse Navigator Flowsheets  Navigator Follow Up Date: 06/27/2022  Navigator Follow Up Reason: Follow-up Appointment  Navigator Location [facility]  Navigator Encounter Type Scan Review  Patient Visit Type MedOnc  Treatment Phase Active Tx  Barriers/Navigation Needs Coordination of Care;Education  Interventions None Required  Acuity Level 2-Minimal Needs (1-2 Barriers Identified)  Support Groups/Services Friends and Family  Time Spent with Patient 15

## 2022-06-28 NOTE — ED Provider Notes (Signed)
Received patient in turnover from Dr. Rogene Houston.  Please see their note for further details of Hx, PE.  Briefly patient is a 52 y.o. female with a Leg Pain .  Hx of pancreatic cancer with mets.  Needs admission for pain control, awaiting MRI.  Patient was taken back for MRI and unfortunately was unable to tolerate lying back flat for it.  Discussed with hospitalist for admission.    Deno Etienne, DO 06/28/22 [id number]

## 2022-06-28 NOTE — ED Triage Notes (Signed)
Pt to er, pt states that she is here for R leg pain, states that the pain goes down the side of her leg, states that the pain goes from her lower back down her leg.  Pt states that she has had the pain for the past month, but today is worse, states that she was going to see her cancer doc for something for the pain but couldn't get in until this afternoon.

## 2022-06-28 NOTE — ED Notes (Signed)
Pt from dpt via wc

## 2022-06-28 NOTE — ED Provider Notes (Addendum)
[place] [facility] AT [facility] Provider Note   CSN: [id number] Arrival date & time: 06/28/22  [id number]     History  Chief Complaint  Patient presents with   Leg Pain    Makayla Good is a 52 y.o. female.  HPI    52 year old female comes in with chief complaint of leg pain.  Patient has known history of metastatic pancreatic cancer.  She is status post radiation therapy of her spine.  She is currently not getting any chemotherapy.  She is a patient of Dr. [name].  Patient states that she has been having pain shooting down her right leg from her back for the last month.  Pain is typically intermittent and tolerable, however over the last 2 days or so her pain is constant, severe and she is unable to sleep.  She has taken oxycodone without any significant relief.  She also has tingling sensation in her feet.  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  She had received radiation therapy to her spine.  Review of system is negative for anybilateral leg weakness, numbness, no saddle anesthesia, no urinary incontinence, retention or bowel incontinence.  Home Medications Prior to Admission medications   Medication Sig Start Date End Date Taking? Authorizing Provider  acetaminophen (TYLENOL) 325 MG tablet Take 2 tablets (650 mg total) by mouth every 6 (six) hours as needed for mild pain (or Fever >/= 101). 05/12/22   Eugenie Filler, MD  cyclobenzaprine (FLEXERIL) 10 MG tablet TAKE 1 TABLET BY MOUTH AT BEDTIME AS NEEDED FOR MUSCLE SPASMS 03/08/22   Lorine Bears, NP  dexamethasone (DECADRON) 4 MG tablet Take 2 tablets (8 mg total) by mouth daily. Take 2 tablets daily x 3 days starting the day after chemotherapy. Take with food. 03/13/22   Volanda Napoleon, MD  Ensure Max Protein (ENSURE MAX PROTEIN) LIQD Take 330 mLs (11 oz total) by mouth daily. 05/13/22   Eugenie Filler, MD  escitalopram (LEXAPRO) 10 MG tablet Take 1 tablet (10 mg total) by  mouth daily. 06/06/22   Celso Amy, NP  fluticasone [facility]) 50 MCG/ACT nasal spray SPRAY 2 SPRAYS INTO EACH NOSTRIL EVERY DAY 06/04/22   Carollee Herter, Alferd Apa, DO  hydrochlorothiazide (HYDRODIURIL) 25 MG tablet Take 1 tablet (25 mg total) by mouth daily. 06/25/22   [name], DO  HYDROcodone bit-homatropine (HYCODAN) 5-1.5 MG/5ML syrup Take 5 mLs by mouth every 4 (four) hours as needed for cough. 05/12/22   Eugenie Filler, MD  Ipratropium-Albuterol (COMBIVENT) 20-100 MCG/ACT AERS respimat Inhale 1 puff into the lungs 3 (three) times daily for 5 days, THEN 1 puff every 6 (six) hours as needed for wheezing. 05/12/22 09/14/22  Eugenie Filler, MD  lidocaine-prilocaine (EMLA) cream Apply to affected area once 03/13/22   Volanda Napoleon, MD  loperamide (IMODIUM) 2 MG capsule Take 2 tabs by mouth with first loose stool, then 1 tab with each additional loose stool as needed. Do not exceed 8 tabs in a 24-hour period 03/13/22   Volanda Napoleon, MD  LORazepam (ATIVAN) 0.5 MG tablet Take 1 tablet (0.5 mg total) by mouth every 8 (eight) hours as needed for anxiety. 06/06/22   Celso Amy, NP  omeprazole (PRILOSEC) 40 MG capsule Take 1 capsule (40 mg total) by mouth daily. 05/16/22   Volanda Napoleon, MD  ondansetron (ZOFRAN) 8 MG tablet Take 1 tablet (8 mg total) by mouth every 8 (eight) hours  as needed for nausea or vomiting. Start on the third day after cisplatin 03/13/22   Volanda Napoleon, MD  Oxycodone HCl 10 MG TABS Take 1 tablet (10 mg total) by mouth every 6 (six) hours as needed. Patient taking differently: Take 10 mg by mouth every 6 (six) hours as needed (pain). 03/21/22   Volanda Napoleon, MD  potassium chloride SA (KLOR-CON M20) 20 MEQ tablet TAKE 2 TABLETS BY MOUTH DAILY 06/04/22   Volanda Napoleon, MD  prochlorperazine (COMPAZINE) 10 MG tablet Take 1 tablet (10 mg total) by mouth every 6 (six) hours as needed for nausea or vomiting (Nausea or vomiting). 03/13/22   Volanda Napoleon, MD  temazepam (RESTORIL) 15 MG capsule Take 1 capsule (15 mg total) by mouth at bedtime as needed for sleep. 02/28/22   Volanda Napoleon, MD      Allergies    Patient has no known allergies.    Review of Systems   Review of Systems  All other systems reviewed and are negative.   Physical Exam Updated Vital Signs BP (!) 140/95   Pulse 100   Temp 99.1 F (37.3 C) (Oral)   Resp 18   Ht '5\' 5"'$  (1.651 m)   Wt 74.8 kg   LMP 07/27/2020 (Within Days)   SpO2 99%   BMI 27.46 kg/m  Physical Exam Vitals and nursing note reviewed.  Constitutional:      Appearance: She is well-developed.  HENT:     Head: Atraumatic.  Eyes:     Extraocular Movements: Extraocular movements intact.     Pupils: Pupils are equal, round, and reactive to light.  Cardiovascular:     Rate and Rhythm: Normal rate.  Pulmonary:     Effort: Pulmonary effort is normal.  Musculoskeletal:     Cervical back: Normal range of motion and neck supple.  Skin:    General: Skin is warm and dry.  Neurological:     Mental Status: She is alert and oriented to person, place, and time.     ED Results / Procedures / Treatments   Labs (all labs ordered are listed, but only abnormal results are displayed) Labs Reviewed  CBC WITH DIFFERENTIAL/PLATELET - Abnormal; Notable for the following components:      Result Value   RBC 3.09 (*)    Hemoglobin 10.0 (*)    HCT 31.2 (*)    MCV 101.0 (*)    RDW 16.9 (*)    Lymphs Abs 0.5 (*)    Monocytes Absolute 1.1 (*)    All other components within normal limits  BASIC METABOLIC PANEL - Abnormal; Notable for the following components:   Potassium 3.3 (*)    Glucose, Bld 116 (*)    Creatinine, Ser 0.41 (*)    All other components within normal limits    EKG None  Radiology No results found.  Procedures .Critical Care  Performed by: Varney Biles, MD Authorized by: Varney Biles, MD   Critical care provider statement:    Critical care time (minutes):  42    Critical care was necessary to treat or prevent imminent or life-threatening deterioration of the following conditions:  CNS failure or compromise   Critical care was time spent personally by me on the following activities:  Development of treatment plan with patient or surrogate, discussions with consultants, evaluation of patient's response to treatment, examination of patient, ordering and review of laboratory studies, ordering and review of radiographic studies, ordering and performing treatments and  interventions, pulse oximetry, re-evaluation of patient's condition, review of old charts and obtaining history from patient or surrogate     Medications Ordered in ED Medications  HYDROmorphone (DILAUDID) injection 1 mg (has no administration in time range)  HYDROmorphone (DILAUDID) injection 2 mg (2 mg Intramuscular Given 06/28/22 0803)    ED Course/ Medical Decision Making/ A&P                             Medical Decision Making Amount and/or Complexity of Data Reviewed Labs: ordered. Radiology: ordered.  Risk Prescription drug management.   52 year old patient comes in with chief complaint of severe back pain and right leg pain.  She has known history of metastatic pancreatic cancer with metastases to her spine diffusely and also evidence of epidural penetration and spinal canal penetration of the disease.  It appears that the patient has been intermittent over the last few days, but now constant, severe and not responding to pain medicine.  Differential diagnosis considered for this patient includes compression fracture, worsening metastatic spine disease with or without epidural extension, cauda equina.  I reviewed patient's oncology notes.  I have reviewed patient's prior MRI and noted that patient has diffuse spine disease covering C, T and L-spine.  I have also noted that she had epidural extension of her cancer and penetration into the spinal canal.  Patient just had a CT scan  of her abdomen pelvis.  Musculoskeletal impression is as following: Musculoskeletal: Diffuse sclerotic bone metastases identified along the spine, ribs, pelvis and femurs. These are significantly progressed from previous examination.  I think patient will benefit with MRI of her spine for disease evaluation.  Right now thinking CAT scan is not going to change the management, patient will need MRI regardless.  I discussed the case with Dr. [name], oncology.  He agrees that patient should benefit with MRI.  I also consulted and spoke with Dr. Isidore Moos, radiation oncology.  She has made Dr. Tammi Klippel, radiation oncologist who is treated patient in the past aware of this visit and potential consultation.  Patient has been accepted to [facility] long ED by the EDP is over there for advanced imaging.  Patient made aware.  She is transferred POV at their request.  Patient will need admission for pain control.  Radiation and medical oncology team both recommend admission.  However, MRI will be needed to ensure there is no neurosurgical intervention needed.  Final Clinical Impression(s) / ED Diagnoses Final diagnoses:  Metastatic cancer to spine [facility])  Radicular pain    Rx / DC Orders ED Discharge Orders     None         Varney Biles, MD 06/28/22 1001

## 2022-06-28 NOTE — H&P (Signed)
History and Physical      Makayla Good [id number] DOB: [birth year] DOA: 06/28/2022  PCP: [name], DO  Patient coming from: home   I have personally briefly reviewed patient's old medical records in [place]  Chief Complaint: Low back pain  HPI: Makayla Good is a 52 y.o. female with medical history significant for metastatic adenocarcinoma of the pancreas, with metastasis to bone, essential pretension, GAD, anemia of chronic disease associated baseline hemoglobin 8-11, who is admitted to [facility] on 06/28/2022 with low back pain after presenting from home to [facility] ED complaining of such.   The patient reports 2 weeks of progressive low back pain, now nearly constant, with radiation into the posterior aspects of the bilateral lower extremities.  Worsens with movement, improves slightly with rest.  However, she notes suboptimal pain control as an outpatient on her existing regimen includes prn oxycodone as well as prn Flexeril.  Denies any overt trauma to the low back.  While her pain radiates into the bilateral extremities, denies any associated acute focal weakness, numbness, or paresthesias involving the lower extremities, including no report of any saddle anesthesia.  Denies any associated fecal incontinence or urinary retention.  Denies any recent subjective fever, chills, rigors, generalized myalgias.  She also denies any recent chest pain, shortness of breath, palpitations, diaphoresis, dizziness, presyncope, or syncope.  She has a known history of metastatic adenocarcinoma of the pancreas, including metastasis to bone, including known metastatic disease to the cervical lymph thoracic spine.  She follows with Dr. [name] As her outpatient oncologist, and has been undergoing chemotherapy.      ED Course:  Vital signs in the ED were notable for the following: Afebrile; heart rate 101 17 7.  Blood pressures in the 130s to 150s;  respiratory rate 16-18, oxygen saturation 98 to 100% on room air.  Labs were notable for the following: BMP notable for the following: Sodium 136, potassium 3.3, bicarbonate 27, creatinine 0.41 relative to most recent prior serum creatinine did 1-0.53 on 06/25/2022.  CBC notable for open cell count 7200, hemoglobin 10 compared to most recent prior hemoglobin data point of 9.2 on 06/06/2022.  Imaging and additional notable ED work-up: MRI of the cervical and thoracic spine redemonstrated osseous metastatic disease, without any definitive evidence of new lesion.  Attempts were made on 3 different occasions to obtain MRI of the lumbar spine, however, these attempts were unsuccessful, due to patient movement in the setting of suboptimal pain control.  EDP is discussed patient's case with on-call oncology, we will continue to follow, and coordinate any potential application for palliative radiation depending upon results of ensuing MRI of the lumbar spine.  While in the ED, the following were administered: Dilaudid 7 mg IV, Dilaudid 2 mg IM, Ativan 1 mg IV x 1, lactated Ringer's x 500 cc bolus.  Subsequently, the patient was admitted for further evaluation management of low back pain, with presenting labs notable for hypokalemia.    Review of Systems: As per HPI otherwise 10 point review of systems negative.   Past Medical History:  Diagnosis Date   Asthma    Eczema    Fibroid    H/O oophorectomy    Hypertension    Obesity    Pancreatic adenocarcinoma ([place]) 03/01/2022   Polycystic ovary    Left   Sleep apnea    no CPAP per MD    Past Surgical History:  Procedure Laterality Date  BACK SURGERY     46yr ago   BREAST BIOPSY Right 2019   fibroadenoma   CESAREAN SECTION  02/01/2005   CESAREAN SECTION  2001   IR IMAGING GUIDED PORT INSERTION  03/07/2022   LAPAROSCOPIC GASTRIC SLEEVE RESECTION N/A 07/29/2017   Procedure: LAPAROSCOPIC GASTRIC SLEEVE RESECTION WITH UPPER ENDO ;  Surgeon:  CClovis Riley MD;  Location: [facility] ORS;  Service: General;  Laterality: N/A;   OOPHORECTOMY     Rt.removed   TUBAL LIGATION  02/01/2005    Social History:  reports that she quit smoking about 2 years ago. Her smoking use included cigarettes and e-cigarettes. She started smoking about 2 years ago. She has a 15.00 pack-year smoking history. She has never used smokeless tobacco. She reports current alcohol use. She reports that she does not use drugs.   No Known Allergies  Family History  Problem Relation Age of Onset   Hypertension Mother    Schizophrenia Mother    Dementia Mother    Mental illness Mother        schizophrenia, dementia   Hypertension Father    Coronary artery disease Father        Stent   Alzheimer's disease Father    Hypertension Sister    Hypertension Sister    Arthritis Sister        rheumatoid   Dementia Maternal Aunt    Kidney disease Other    Colon cancer Neg Hx    Stomach cancer Neg Hx    Pancreatic cancer Neg Hx    Esophageal cancer Neg Hx    Rectal cancer Neg Hx    Breast cancer Neg Hx     Family history reviewed and not pertinent    Prior to Admission medications   Medication Sig Start Date End Date Taking? Authorizing Provider  acetaminophen (TYLENOL) 325 MG tablet Take 2 tablets (650 mg total) by mouth every 6 (six) hours as needed for mild pain (or Fever >/= 101). 05/12/22   TEugenie Filler MD  cyclobenzaprine (FLEXERIL) 10 MG tablet TAKE 1 TABLET BY MOUTH AT BEDTIME AS NEEDED FOR MUSCLE SPASMS 03/08/22   WLorine Bears NP  dexamethasone (DECADRON) 4 MG tablet Take 2 tablets (8 mg total) by mouth daily. Take 2 tablets daily x 3 days starting the day after chemotherapy. Take with food. 03/13/22   EVolanda Napoleon MD  Ensure Max Protein (ENSURE MAX PROTEIN) LIQD Take 330 mLs (11 oz total) by mouth daily. 05/13/22   TEugenie Filler MD  escitalopram (LEXAPRO) 10 MG tablet TAKE 1 TABLET BY MOUTH EVERY DAY 06/28/22   EVolanda Napoleon MD   fluticasone ([facility] 50 MCG/ACT nasal spray SPRAY 2 SPRAYS INTO EACH NOSTRIL EVERY DAY 06/04/22   LCarollee Herter YAlferd Apa DO  hydrochlorothiazide (HYDRODIURIL) 25 MG tablet Take 1 tablet (25 mg total) by mouth daily. 06/25/22   LAnn Held DO  HYDROcodone bit-homatropine (HYCODAN) 5-1.5 MG/5ML syrup Take 5 mLs by mouth every 4 (four) hours as needed for cough. 05/12/22   TEugenie Filler MD  Ipratropium-Albuterol (COMBIVENT) 20-100 MCG/ACT AERS respimat Inhale 1 puff into the lungs 3 (three) times daily for 5 days, THEN 1 puff every 6 (six) hours as needed for wheezing. 05/12/22 09/14/22  TEugenie Filler MD  lidocaine-prilocaine (EMLA) cream Apply to affected area once 03/13/22   EVolanda Napoleon MD  loperamide (IMODIUM) 2 MG capsule Take 2 tabs by mouth with first loose stool, then 1 tab  with each additional loose stool as needed. Do not exceed 8 tabs in a 24-hour period 03/13/22   Volanda Napoleon, MD  LORazepam (ATIVAN) 0.5 MG tablet Take 1 tablet (0.5 mg total) by mouth every 8 (eight) hours as needed for anxiety. 06/06/22   Celso Amy, NP  omeprazole (PRILOSEC) 40 MG capsule Take 1 capsule (40 mg total) by mouth daily. 05/16/22   Volanda Napoleon, MD  ondansetron (ZOFRAN) 8 MG tablet Take 1 tablet (8 mg total) by mouth every 8 (eight) hours as needed for nausea or vomiting. Start on the third day after cisplatin 03/13/22   Volanda Napoleon, MD  Oxycodone HCl 10 MG TABS Take 1 tablet (10 mg total) by mouth every 6 (six) hours as needed. Patient taking differently: Take 10 mg by mouth every 6 (six) hours as needed (pain). 03/21/22   Volanda Napoleon, MD  potassium chloride SA (KLOR-CON M20) 20 MEQ tablet TAKE 2 TABLETS BY MOUTH DAILY 06/04/22   Volanda Napoleon, MD  prochlorperazine (COMPAZINE) 10 MG tablet Take 1 tablet (10 mg total) by mouth every 6 (six) hours as needed for nausea or vomiting (Nausea or vomiting). 03/13/22   Volanda Napoleon, MD  temazepam (RESTORIL) 15 MG capsule  Take 1 capsule (15 mg total) by mouth at bedtime as needed for sleep. 02/28/22   Volanda Napoleon, MD     Objective    Physical Exam: Vitals:   06/28/22 1820 06/28/22 2137 06/28/22 2200 06/28/22 2215  BP: (!) 153/104 (!) 140/100  (!) 146/103  Pulse: (!) 110 (!) 125  (!) 117  Resp: 16 16    Temp: 98.8 F (37.1 C)  98.5 F (36.9 C)   TempSrc: Oral  Oral   SpO2: 100% 100%  100%  Weight:      Height:        General: appears to be stated age; alert, oriented Skin: warm, dry, no rash Head:  AT/[place] Mouth:  Oral mucosa membranes appear moist, normal dentition Neck: supple; trachea midline Heart:  RRR; did not appreciate any M/R/G Lungs: CTAB, did not appreciate any wheezes, rales, or rhonchi Abdomen: + BS; soft, ND, NT Vascular: 2+ pedal pulses b/l; 2+ radial pulses b/l Extremities: no peripheral edema, no muscle wasting Neuro: strength and sensation intact in upper and lower extremities b/l    Labs on Admission: I have personally reviewed following labs and imaging studies  CBC: Recent Labs  Lab 06/28/22 0830  WBC 7.2  NEUTROABS 5.4  HGB 10.0*  HCT 31.2*  MCV 101.0*  PLT [id number]   Basic Metabolic Panel: Recent Labs  Lab 06/25/22 1553 06/28/22 0830  NA 142 136  K 4.3 3.3*  CL 102 102  CO2 29 27  GLUCOSE 100* 116*  BUN 12 10  CREATININE 0.53 0.41*  CALCIUM 10.6* 9.1   GFR: Estimated Creatinine Clearance: 84.2 mL/min (A) (by C-G formula based on SCr of 0.41 mg/dL (L)). Liver Function Tests: No results for input(s): "AST", "ALT", "ALKPHOS", "BILITOT", "PROT", "ALBUMIN" in the last 168 hours. No results for input(s): "LIPASE", "AMYLASE" in the last 168 hours. No results for input(s): "AMMONIA" in the last 168 hours. Coagulation Profile: No results for input(s): "INR", "PROTIME" in the last 168 hours. Cardiac Enzymes: No results for input(s): "CKTOTAL", "CKMB", "CKMBINDEX", "TROPONINI" in the last 168 hours. BNP (last 3 results) No results for input(s): "PROBNP"  in the last 8760 hours. HbA1C: No results for input(s): "HGBA1C" in the last 72 hours.  CBG: No results for input(s): "GLUCAP" in the last 168 hours. Lipid Profile: No results for input(s): "CHOL", "HDL", "LDLCALC", "TRIG", "CHOLHDL", "LDLDIRECT" in the last 72 hours. Thyroid Function Tests: No results for input(s): "TSH", "T4TOTAL", "FREET4", "T3FREE", "THYROIDAB" in the last 72 hours. Anemia Panel: No results for input(s): "VITAMINB12", "FOLATE", "FERRITIN", "TIBC", "IRON", "RETICCTPCT" in the last 72 hours. Urine analysis:    Component Value Date/Time   COLORURINE YELLOW 05/06/2022 0909   APPEARANCEUR HAZY (A) 05/06/2022 0909   LABSPEC 1.012 05/06/2022 0909   PHURINE 6.0 05/06/2022 0909   GLUCOSEU NEGATIVE 05/06/2022 0909   GLUCOSEU NEGATIVE 01/22/2022 1508   HGBUR NEGATIVE 05/06/2022 0909   HGBUR negative 05/18/2009 0000   BILIRUBINUR NEGATIVE 05/06/2022 0909   BILIRUBINUR neg 01/23/2016 1648   KETONESUR NEGATIVE 05/06/2022 0909   PROTEINUR NEGATIVE 05/06/2022 0909   UROBILINOGEN 0.2 01/22/2022 1508   NITRITE NEGATIVE 05/06/2022 0909   LEUKOCYTESUR NEGATIVE 05/06/2022 0909    Radiological Exams on Admission: MR CERVICAL SPINE WO CONTRAST  Result Date: 06/28/2022 CLINICAL DATA:  Staging.  Known metastatic disease. EXAM: MRI CERVICAL AND THORACIC SPINE WITHOUT CONTRAST TECHNIQUE: Multiplanar and multiecho pulse sequences of the cervical spine, to include the craniocervical junction and cervicothoracic junction, and the thoracic spine, were obtained without intravenous contrast. COMPARISON:  03/05/22 MRI C SPine and MRI T SPine FINDINGS: Limitations: Incomplete exam due to patient intolerance. Patient was unable to tolerate supine positioning. Thoracic spine was incompletely imaged and only the sagittal T2 and T1 weighted sequences were acquired. MRI CERVICAL SPINE FINDINGS Alignment: There is straightening of the normal cervical lordosis. There is mild retrolisthesis of C5 on C6,  unchanged from prior exam. Vertebrae: Redemonstrated are T2/stir hyperintense lesions at the C4 and C5 vertebral body levels. There are additional T1 hypointense lesions at the C7, T2, and T4 levels. The lesions were present on prior exam. Redemonstrated is a compression deformity of the T4 level, which difficult to compare, but is likely unchanged compared to prior exam. Cord: No evidence of cord signal abnormality. Posterior Fossa, vertebral arteries, paraspinal tissues: A 9 mm thyroid nodule in the right thyroid gland. Disc levels: Redemonstrated severe spinal canal stenosis at the C6 vertebral body level, likely unchanged from prior exam. MRI THORACIC SPINE FINDINGS Alignment:  Physiologic. Vertebrae: Redemonstrated are T1 hypointense lesions in the T2, T4, T9, T10 L1 vertebral body levels. There is no definite evidence of a new lesion. Cord: Redemonstrated spinal canal stenosis of the T8-T9 levels secondary to a combination of the disc bulge and possible ligamentum flavum hypertrophy. There is likely mild stenosis of the T4 level, which is also unchanged from prior exam. Paraspinal and other soft tissues: Negative. Disc levels: Axial sequences were not acquired due to patient intolerance. Within this limitation, there is likely moderate to severe spinal canal stenosis at T8-T9 level, unchanged from prior exam. There is likely also at least mild spinal canal narrowing of the T4 level, which is also unchanged. There is moderate moderate to severe right-sided neural foraminal stenosis at T9-T10 and T10-T11. IMPRESSION: Incomplete exam due to patient intolerance. Thoracic spine was incompletely imaged and only the sagittal T2 and T1 weighted sequences were acquired. Lack of IV Contrast markedly limits the ability to assess for the presence of epidural spread of tumor. 1. Redemonstrated osseous metastatic disease in the cervical, thoracic, and lumbar spine. No definite evidence of a new lesion. 2. Unchanged severe  spinal canal stenosis at the C3-C6 vertebral body levels. 3. Unchanged moderate spinal canal stenosis  at T8-T9 secondary to posterior epidural tumor. 4. Unchanged mild spinal canal stenosis at T4. 5. Unchanged compression deformity of the T4 vertebral body. Electronically Signed   By: Marin Roberts M.D.   On: 06/28/2022 15:54   MR THORACIC SPINE WO CONTRAST  Result Date: 06/28/2022 CLINICAL DATA:  Staging.  Known metastatic disease. EXAM: MRI CERVICAL AND THORACIC SPINE WITHOUT CONTRAST TECHNIQUE: Multiplanar and multiecho pulse sequences of the cervical spine, to include the craniocervical junction and cervicothoracic junction, and the thoracic spine, were obtained without intravenous contrast. COMPARISON:  03/05/22 MRI C SPine and MRI T SPine FINDINGS: Limitations: Incomplete exam due to patient intolerance. Patient was unable to tolerate supine positioning. Thoracic spine was incompletely imaged and only the sagittal T2 and T1 weighted sequences were acquired. MRI CERVICAL SPINE FINDINGS Alignment: There is straightening of the normal cervical lordosis. There is mild retrolisthesis of C5 on C6, unchanged from prior exam. Vertebrae: Redemonstrated are T2/stir hyperintense lesions at the C4 and C5 vertebral body levels. There are additional T1 hypointense lesions at the C7, T2, and T4 levels. The lesions were present on prior exam. Redemonstrated is a compression deformity of the T4 level, which difficult to compare, but is likely unchanged compared to prior exam. Cord: No evidence of cord signal abnormality. Posterior Fossa, vertebral arteries, paraspinal tissues: A 9 mm thyroid nodule in the right thyroid gland. Disc levels: Redemonstrated severe spinal canal stenosis at the C6 vertebral body level, likely unchanged from prior exam. MRI THORACIC SPINE FINDINGS Alignment:  Physiologic. Vertebrae: Redemonstrated are T1 hypointense lesions in the T2, T4, T9, T10 L1 vertebral body levels. There is no definite  evidence of a new lesion. Cord: Redemonstrated spinal canal stenosis of the T8-T9 levels secondary to a combination of the disc bulge and possible ligamentum flavum hypertrophy. There is likely mild stenosis of the T4 level, which is also unchanged from prior exam. Paraspinal and other soft tissues: Negative. Disc levels: Axial sequences were not acquired due to patient intolerance. Within this limitation, there is likely moderate to severe spinal canal stenosis at T8-T9 level, unchanged from prior exam. There is likely also at least mild spinal canal narrowing of the T4 level, which is also unchanged. There is moderate moderate to severe right-sided neural foraminal stenosis at T9-T10 and T10-T11. IMPRESSION: Incomplete exam due to patient intolerance. Thoracic spine was incompletely imaged and only the sagittal T2 and T1 weighted sequences were acquired. Lack of IV Contrast markedly limits the ability to assess for the presence of epidural spread of tumor. 1. Redemonstrated osseous metastatic disease in the cervical, thoracic, and lumbar spine. No definite evidence of a new lesion. 2. Unchanged severe spinal canal stenosis at the C3-C6 vertebral body levels. 3. Unchanged moderate spinal canal stenosis at T8-T9 secondary to posterior epidural tumor. 4. Unchanged mild spinal canal stenosis at T4. 5. Unchanged compression deformity of the T4 vertebral body. Electronically Signed   By: Marin Roberts M.D.   On: 06/28/2022 15:54      Assessment/Plan    Principal Problem:   Low back pain Active Problems:   Essential hypertension   Hypokalemia   GERD (gastroesophageal reflux disease)   Pancreatic adenocarcinoma ([facility])   GAD (generalized anxiety disorder)   Allergic rhinitis   Anemia of chronic disease    #) Acute low back pain: 2 weeks of progressive low back pain, radiating into the bilateral lower extremities, but in the absence of any red flag symptoms nor any evidence to suggest cauda equina  syndrome.  In the context of her known history of metastatic pancreatic cancer to bone, including known metastatic disease to the cervical and thoracic spine, there is concern for worsening metastatic disease, including that to the lumbar spine contributing to her presenting worsening back pain.  EDP discussed patient's case with on-call oncology, who recommended pursuit of MRI imaging of the spine to further evaluate this possibility, with plan for oncology to assist with coordination with radiation oncology for a potential application of palliative radiation to new or worsening bony lesions involving the spine.  However, pursuit of MRI lumbar spine has been unsuccessful following 3 attempts in the setting of suboptimal pain control in spite of multiple doses of Dilaudid, as further quantified above.  Will attempt to optimize pain control in order to pursue MRI lumbar spine for the above purpose.  Of note, given the volume of Dilaudid, with IV and IM that the patient has received thus far, with suboptimal result in pain control, she may benefit from Dilaudid PCA.  Plan: Dilaudid 1 mg IV every 2 hours as needed.  Toradol 15 mg IV x 1 dose now followed by prn IV Toradol.  As needed acetaminophen.  Resume home prn Flexeril, but expand from daily as needed to 3 times daily as needed, add lidocaine patch to affected area of the back.  As needed oral Ativan.  MRI of the lumbar spine, as above.  Oncology consulted in her outpatient medical oncologist, Dr. [name] , Has been added to the treatment team.  Incentive spirometry.  Prn Narcan.          #) Hypokalemia: Of which it appears that she also experiences as an outpatient, she is on potassium chloride 40 mill equivalents p.o. daily as an outpatient.  Plan: Potassium chloride 40 mill colons p.o. x 1 dose now, followed by resumption of home potassium chloride.  Add on serum magnesium level.  Repeat CMP in the morning.              #) Essential  Hypertension: documented h/o such, with outpatient antihypertensive regimen including HCTZ.  SBP's in the ED today: 130s to 150s mmHg.   Plan: Close monitoring of subsequent BP via routine VS, particularly given the above opioid therapy to achieve improvement in pain control related to presenting low back pain.  Hold home HCTZ for now.  Repeat CMP in the morning.             #) Metastatic adenocarcinoma of the pancreas: Documented history of such, with known metastatic disease to bone.  Follows with Dr.**As her outpatient medical oncologist.  Plan: Dr. [name] has been added to the patient's treatment team.  MRI of the lumbar spine, per recommendation of on-call oncology to further evaluate for any new/worsening bony metastatic lesions that may be contributory to her worsening presenting back pain, as above.              #) Allergic Rhinitis: documented h/o such, on scheduled intranasal Flonase as outpatient.    Plan: cont home Flonase.                #) Generalized anxiety disorder: documented h/o such. On Lexapro as well as prn Ativan as outpatient.    Plan: Continue outpatient Lexapro and as needed Ativan.             #) GERD: documented h/o such; on omeprazole as outpatient.   Plan: continue home PPI.            #)  Anemia of chronic disease: Documented history of such, a/w with baseline hgb range 8-11, with presenting hgb consistent with this range, in the absence of any overt evidence of active bleed.     Plan: Repeat CBC in the morning.  Check INR.        DVT prophylaxis: SCD's   Code Status: Full code Family Communication: none Disposition Plan: Per Rounding Team Consults called: Her outpatient medical oncologist, Dr. [name] , Has been added to treatment team, as further detailed above;  Admission status: Observation    I SPENT GREATER THAN 75  MINUTES IN CLINICAL CARE TIME/MEDICAL DECISION-MAKING IN COMPLETING  THIS ADMISSION.     [place] DO [facility] [place]   06/28/2022, 11:40 PM

## 2022-06-28 NOTE — ED Notes (Signed)
Pt in bed, pt reports minimal decrease in pain, states that it took the edge off, ice packs given, warm blankets given, bed adjusted for pt comfort.

## 2022-06-28 NOTE — ED Notes (Signed)
Patient taken to MRI at this time 

## 2022-06-28 NOTE — ED Provider Notes (Signed)
[place] EMERGENCY DEPARTMENT AT [facility] Provider Note   CSN: [id number] Arrival date & time: 06/28/22  1914     History  Chief Complaint  Patient presents with   Leg Pain    Makayla Good is a 52 y.o. female with known metastatic pancreatic cancer who presents with leg pain, transfer from [facility] for MRI.   Sent to [name] ED for MRI spine with and w/o contrast. Known history of metastatic pancreatic cancer with mets to the epidural space and spinal canal with worsening radicular pain. Patient presented with shooting pain down her R leg x 1 month, but worsening significantly over the last 2 days, now is so severe she cannot sleep. Also w/ tingling sensation in her feet.     Leg Pain      Home Medications Prior to Admission medications   Medication Sig Start Date End Date Taking? Authorizing Provider  acetaminophen (TYLENOL) 325 MG tablet Take 2 tablets (650 mg total) by mouth every 6 (six) hours as needed for mild pain (or Fever >/= 101). 05/12/22   Rodolph Bong, MD  cyclobenzaprine (FLEXERIL) 10 MG tablet TAKE 1 TABLET BY MOUTH AT BEDTIME AS NEEDED FOR MUSCLE SPASMS 03/08/22   Juanda Chance, NP  dexamethasone (DECADRON) 4 MG tablet Take 2 tablets (8 mg total) by mouth daily. Take 2 tablets daily x 3 days starting the day after chemotherapy. Take with food. 03/13/22   Josph Macho, MD  Ensure Max Protein (ENSURE MAX PROTEIN) LIQD Take 330 mLs (11 oz total) by mouth daily. 05/13/22   Rodolph Bong, MD  escitalopram (LEXAPRO) 10 MG tablet TAKE 1 TABLET BY MOUTH EVERY DAY 06/28/22   Josph Macho, MD  fluticasone [facility]) 50 MCG/ACT nasal spray SPRAY 2 SPRAYS INTO EACH NOSTRIL EVERY DAY 06/04/22   Zola Button, Grayling Congress, DO  hydrochlorothiazide (HYDRODIURIL) 25 MG tablet Take 1 tablet (25 mg total) by mouth daily. 06/25/22   Donato Schultz, DO  HYDROcodone bit-homatropine (HYCODAN) 5-1.5 MG/5ML syrup Take 5 mLs by  mouth every 4 (four) hours as needed for cough. 05/12/22   Rodolph Bong, MD  Ipratropium-Albuterol (COMBIVENT) 20-100 MCG/ACT AERS respimat Inhale 1 puff into the lungs 3 (three) times daily for 5 days, THEN 1 puff every 6 (six) hours as needed for wheezing. 05/12/22 09/14/22  Rodolph Bong, MD  lidocaine-prilocaine (EMLA) cream Apply to affected area once 03/13/22   Josph Macho, MD  loperamide (IMODIUM) 2 MG capsule Take 2 tabs by mouth with first loose stool, then 1 tab with each additional loose stool as needed. Do not exceed 8 tabs in a 24-hour period 03/13/22   Josph Macho, MD  LORazepam (ATIVAN) 0.5 MG tablet Take 1 tablet (0.5 mg total) by mouth every 8 (eight) hours as needed for anxiety. 06/06/22   Erenest Blank, NP  omeprazole (PRILOSEC) 40 MG capsule Take 1 capsule (40 mg total) by mouth daily. 05/16/22   Josph Macho, MD  ondansetron (ZOFRAN) 8 MG tablet Take 1 tablet (8 mg total) by mouth every 8 (eight) hours as needed for nausea or vomiting. Start on the third day after cisplatin 03/13/22   Josph Macho, MD  Oxycodone HCl 10 MG TABS Take 1 tablet (10 mg total) by mouth every 6 (six) hours as needed. Patient taking differently: Take 10 mg by mouth every 6 (six) hours as needed (pain). 03/21/22   Josph Macho,  MD  potassium chloride SA (KLOR-CON M20) 20 MEQ tablet TAKE 2 TABLETS BY MOUTH DAILY 06/04/22   Josph Macho, MD  prochlorperazine (COMPAZINE) 10 MG tablet Take 1 tablet (10 mg total) by mouth every 6 (six) hours as needed for nausea or vomiting (Nausea or vomiting). 03/13/22   Josph Macho, MD  temazepam (RESTORIL) 15 MG capsule Take 1 capsule (15 mg total) by mouth at bedtime as needed for sleep. 02/28/22   Josph Macho, MD      Allergies    Patient has no known allergies.    Review of Systems   Review of Systems Review of systems Negative for f/c.  A 10 point review of systems was performed and is negative unless otherwise reported in  HPI.  Physical Exam Updated Vital Signs BP (!) 145/94 (BP Location: Right Arm)   Pulse (!) 115   Temp 98.5 F (36.9 C) (Oral)   Resp 17   Ht 5\' 5"  (1.651 m)   Wt 74.8 kg   LMP 07/27/2020 (Within Days)   SpO2 100%   BMI 27.46 kg/m  Physical Exam General: Normal appearing female, lying in bed.  HEENT: PERRLA, Sclera anicteric, MMM, trachea midline.  Cardiology: RRR, no murmurs/rubs/gallops. BL radial and DP pulses equal bilaterally.  Resp: Normal respiratory rate and effort. CTAB, no wheezes, rhonchi, crackles.  Abd: Soft, non-tender, non-distended. No rebound tenderness or guarding.  GU: Deferred. MSK: No peripheral edema or signs of trauma. Extremities without deformity .  Skin: warm, dry. Back: TTP in midline lumbar spine, no stepoffs or deformities Neuro: A&Ox4, CNs II-XII grossly intact. 5/5 strength in all extremities. Sensation grossly intact.  Psych: Normal mood and affect.   ED Results / Procedures / Treatments   Labs (all labs ordered are listed, but only abnormal results are displayed) Labs Reviewed  CBC WITH DIFFERENTIAL/PLATELET - Abnormal; Notable for the following components:      Result Value   RBC 3.09 (*)    Hemoglobin 10.0 (*)    HCT 31.2 (*)    MCV 101.0 (*)    RDW 16.9 (*)    Lymphs Abs 0.5 (*)    Monocytes Absolute 1.1 (*)    All other components within normal limits  BASIC METABOLIC PANEL - Abnormal; Notable for the following components:   Potassium 3.3 (*)    Glucose, Bld 116 (*)    Creatinine, Ser 0.41 (*)    All other components within normal limits    EKG None  Radiology No results found.  Procedures Procedures    Medications Ordered in ED Medications  HYDROmorphone (DILAUDID) injection 2 mg (2 mg Intramuscular Given 06/28/22 0803)  HYDROmorphone (DILAUDID) injection 1 mg (1 mg Intravenous Given 06/28/22 1002)  lactated ringers bolus 500 mL (0 mLs Intravenous Stopped 06/28/22 1540)  HYDROmorphone (DILAUDID) injection 1 mg (1 mg  Intravenous Given 06/28/22 1300)  gadobutrol (GADAVIST) 1 MMOL/ML injection 7.5 mL (7.5 mLs Intravenous Contrast Given 06/30/22 1838)  HYDROmorphone (DILAUDID) injection 1 mg (1 mg Intravenous Given 06/28/22 1447)  HYDROmorphone (DILAUDID) injection 1 mg (1 mg Intravenous Given 06/28/22 1642)  HYDROmorphone (DILAUDID) injection 1 mg (1 mg Intravenous Given 06/28/22 1913)  HYDROmorphone (DILAUDID) injection 1 mg (1 mg Intravenous Given 06/28/22 2142)  LORazepam (ATIVAN) injection 1 mg (1 mg Intravenous Given 06/28/22 2234)  HYDROmorphone (DILAUDID) injection 1 mg (1 mg Intravenous Given 06/28/22 2234)  potassium chloride SA (KLOR-CON M) CR tablet 40 mEq (40 mEq Oral Given 06/29/22 0108)  ketorolac (TORADOL)  15 MG/ML injection 15 mg (15 mg Intravenous Given 06/29/22 0109)  dexamethasone (DECADRON) injection 20 mg (20 mg Intravenous Given 07/01/22 1314)  heparin lock flush 100 unit/mL (500 Units Intracatheter Given 07/01/22 1343)    ED Course/ Medical Decision Making/ A&P                          Medical Decision Making Amount and/or Complexity of Data Reviewed Labs: ordered. Radiology: ordered.  Risk Prescription drug management. Decision regarding hospitalization.    This patient presents to the ED for concern of worsening radicular pain in s/o known spinal mets from pancreatic cancer; this involves an extensive number of treatment options, and is a complaint that carries with it a high risk of complications and morbidity.  I considered the following differential and admission for this acute, potentially life threatening condition.   MDM:    Worsening radicular pain in the setting of known metastatic cancer is concerning for new metastasis, spinal cord lesions, spinal cord compression, pathologic fracture.  No fever, is not currently receiving chemotherapy, lower concern for spinal epidural abscess, transverse myelitis. Per transferring physician Dr. Rhunette Croft, Dr. Kathrynn Running, Rad-onc and Dr. Myna Hidalgo - med-onc  aware of the patient getting mri. Might likely need Rad-onc consult after MRI. No cauda equina. Will possibly need admission for pain control after MRI as well.   Clinical Course as of 07/03/22 1147  Thu Jun 28, 2022  1416 Patient could not tolerate the exam d/t pain despite receiving 1mg  IV dilaudid immediately before scans. Will give more pain control and try again. [HN]  1547 Patient is signed out to the oncoming ED physician Dr. Deretha Emory who is made aware of her history, presentation, exam, workup, and plan.  Plan is to obtain MRIs and likely admit for pain control. Patient in MRI now.  [HN]    Clinical Course User Index [HN] Loetta Rough, MD    Labs: I personally interpreted labs ordered from [facility].  The pertinent results include:  K 3.3, glucose 116, Cr0.41, WBC 7.2, Hgb 10.0 (at approximate baseline), no neutropenia  Imaging Studies ordered: Imaging studies including MRI total spine w/ w/o contrast ordered I independently visualized and interpreted imaging. I agree with the radiologist interpretation  Additional history obtained from chart review, transferring physician.    Cardiac Monitoring: The patient was maintained on a cardiac monitor.  I personally viewed and interpreted the cardiac monitored which showed an underlying rhythm of: Normal sinus rhythm  Reevaluation: After the interventions noted above, I reevaluated the patient and found that they have :stayed the same  Social Determinants of Health: Patient lives independently   Disposition: Signed out to oncoming physician  Co morbidities that complicate the patient evaluation  Past Medical History:  Diagnosis Date   Asthma    Eczema    Fibroid    H/O oophorectomy    Hypertension    Obesity    Pancreatic adenocarcinoma (HCC) 03/01/2022   Polycystic ovary    Left   Sleep apnea    no CPAP per MD     Medicines Meds ordered this encounter  Medications   DISCONTD: HYDROmorphone (DILAUDID) injection 1  mg   HYDROmorphone (DILAUDID) injection 2 mg   HYDROmorphone (DILAUDID) injection 1 mg    I have reviewed the patients home medicines and have made adjustments as needed  Problem List / ED Course: Problem List Items Addressed This Visit       Musculoskeletal and Integument  Metastatic cancer to spine [facility]) - Primary   Other Visit Diagnoses     Radicular pain                       This note was created using dictation software, which may contain spelling or grammatical errors.    Loetta Rough, MD 07/05/22 [phone]

## 2022-06-28 NOTE — ED Provider Notes (Addendum)
Patient has been up walk which is reassuring.  I was under the impression that she was going back for her MRIs.  But they did not call her back.  Will get a recontact him to see if we can get the rest of the MRI done.  There is only the lumbar spine is still necessary.  We do not have reports on the MRI cervical and MRI thoracic.  They are saying though that they are incomplete.  Other making comments about lumbar spine so maybe this is complete.  Will try to get clarification from the MRI techs.  Ports shows redemonstrated osseous metastic disease in the cervical thoracic and lumbar spine no definitive evidence of a new lesion unchanged to severe spinal cord or canal stenosis at C3-C6.  Unchanged moderate spinal stenosis at T8-T9 secondary to posterior epidural tumor.  Unchanged mild spinal stenosis at T4 unchanged compression deformity at T4 vertebral body.  But not clear whether they really did get lumbar spine.  Appears that she probably needs a lumbar spine MRI to be completed.  Patient is looking much more comfortable.  Is been up walking all very reassuring may be able to be discharged home if the lumbar spine does not have anything significant.  There are a lot of significant findings on the cervical and thoracic part of the spine.  But may not warrant admission.   Fredia Sorrow, MD 06/28/22 1750    Fredia Sorrow, MD 06/28/22 1803  I would be very beneficial to get the lumbar MRI completed.  They say they are planning on trying to get to it.  We are struggling again with patient's pain.  Patient's had pain to the right leg does not have left leg pain.'s been ongoing for months but worse in the last week or 2.  Also had numbness to that right foot for a long period of time.  I think it would be helpful to get the MRI at this point but I do think that probably patient probably warranted admission directly to the hospital for pain control and further evaluation to see what can be done to  help her pain if anything at all.  Most likely patient despite what ever the MRI shows will require admission to the hospitalist service with consultation with oncology.  In addition reviewing Dr. Andree Elk note that was the plan.  Kind of wonder why not admitted directly to medicine service and then if there was a neurological emergency that that could have just been dealt with.  If we run into difficulty getting the MRI I will just get her admitted.      Fredia Sorrow, MD 06/28/22 Richarda Overlie, MD 06/28/22 1924  Discussed with on-call oncology Dr. Lindi Adie he agrees with admission.  MRI states are coming down to do the lumbar MRI.  Hopefully we get that done.  We will be premedicating her to help get her through that.  After that will need to contact hospitalist for admission.    Fredia Sorrow, MD 06/28/22 2231

## 2022-06-28 NOTE — ED Notes (Signed)
Pt in bed, pt reports decreased pain, per md pt is being accepted to the er by MD Trifan, this rn has previously talked with charge RN and report has been given, pt verbalized understanding follow and transfer via pov to [facility], states that she normally gets her onc treatment there. Pt states that she wants to go pov, IV remains in L ac, I secured for transfer.

## 2022-06-29 ENCOUNTER — Encounter (HOSPITAL_COMMUNITY): Payer: Self-pay | Admitting: Internal Medicine

## 2022-06-29 DIAGNOSIS — I10 Essential (primary) hypertension: Secondary | ICD-10-CM | POA: Diagnosis present

## 2022-06-29 DIAGNOSIS — C259 Malignant neoplasm of pancreas, unspecified: Secondary | ICD-10-CM

## 2022-06-29 DIAGNOSIS — Z8261 Family history of arthritis: Secondary | ICD-10-CM | POA: Diagnosis not present

## 2022-06-29 DIAGNOSIS — Z8507 Personal history of malignant neoplasm of pancreas: Secondary | ICD-10-CM | POA: Diagnosis not present

## 2022-06-29 DIAGNOSIS — D638 Anemia in other chronic diseases classified elsewhere: Secondary | ICD-10-CM | POA: Diagnosis present

## 2022-06-29 DIAGNOSIS — G4733 Obstructive sleep apnea (adult) (pediatric): Secondary | ICD-10-CM | POA: Diagnosis present

## 2022-06-29 DIAGNOSIS — K219 Gastro-esophageal reflux disease without esophagitis: Secondary | ICD-10-CM | POA: Diagnosis present

## 2022-06-29 DIAGNOSIS — C787 Secondary malignant neoplasm of liver and intrahepatic bile duct: Secondary | ICD-10-CM | POA: Diagnosis present

## 2022-06-29 DIAGNOSIS — E876 Hypokalemia: Secondary | ICD-10-CM | POA: Diagnosis present

## 2022-06-29 DIAGNOSIS — D630 Anemia in neoplastic disease: Secondary | ICD-10-CM | POA: Diagnosis present

## 2022-06-29 DIAGNOSIS — M545 Low back pain, unspecified: Secondary | ICD-10-CM | POA: Diagnosis present

## 2022-06-29 DIAGNOSIS — M48061 Spinal stenosis, lumbar region without neurogenic claudication: Secondary | ICD-10-CM | POA: Diagnosis present

## 2022-06-29 DIAGNOSIS — Z95828 Presence of other vascular implants and grafts: Secondary | ICD-10-CM | POA: Diagnosis not present

## 2022-06-29 DIAGNOSIS — Z818 Family history of other mental and behavioral disorders: Secondary | ICD-10-CM | POA: Diagnosis not present

## 2022-06-29 DIAGNOSIS — E440 Moderate protein-calorie malnutrition: Secondary | ICD-10-CM | POA: Diagnosis present

## 2022-06-29 DIAGNOSIS — Z1152 Encounter for screening for COVID-19: Secondary | ICD-10-CM | POA: Diagnosis not present

## 2022-06-29 DIAGNOSIS — C7951 Secondary malignant neoplasm of bone: Secondary | ICD-10-CM | POA: Diagnosis not present

## 2022-06-29 DIAGNOSIS — F411 Generalized anxiety disorder: Secondary | ICD-10-CM | POA: Diagnosis present

## 2022-06-29 DIAGNOSIS — Z79899 Other long term (current) drug therapy: Secondary | ICD-10-CM | POA: Diagnosis not present

## 2022-06-29 DIAGNOSIS — G893 Neoplasm related pain (acute) (chronic): Secondary | ICD-10-CM | POA: Diagnosis not present

## 2022-06-29 DIAGNOSIS — Z8249 Family history of ischemic heart disease and other diseases of the circulatory system: Secondary | ICD-10-CM | POA: Diagnosis not present

## 2022-06-29 DIAGNOSIS — J309 Allergic rhinitis, unspecified: Secondary | ICD-10-CM | POA: Insufficient documentation

## 2022-06-29 DIAGNOSIS — J45909 Unspecified asthma, uncomplicated: Secondary | ICD-10-CM | POA: Diagnosis present

## 2022-06-29 DIAGNOSIS — Z87891 Personal history of nicotine dependence: Secondary | ICD-10-CM | POA: Diagnosis not present

## 2022-06-29 DIAGNOSIS — Z820 Family history of epilepsy and other diseases of the nervous system: Secondary | ICD-10-CM | POA: Diagnosis not present

## 2022-06-29 DIAGNOSIS — Z6827 Body mass index (BMI) 27.0-27.9, adult: Secondary | ICD-10-CM | POA: Diagnosis not present

## 2022-06-29 DIAGNOSIS — Z923 Personal history of irradiation: Secondary | ICD-10-CM | POA: Diagnosis not present

## 2022-06-29 LAB — COMPREHENSIVE METABOLIC PANEL
ALT: 24 U/L (ref 0–44)
AST: 34 U/L (ref 15–41)
Albumin: 3.6 g/dL (ref 3.5–5.0)
Alkaline Phosphatase: 200 U/L — ABNORMAL HIGH (ref 38–126)
Anion gap: 11 (ref 5–15)
BUN: 7 mg/dL (ref 6–20)
CO2: 25 mmol/L (ref 22–32)
Calcium: 8.8 mg/dL — ABNORMAL LOW (ref 8.9–10.3)
Chloride: 98 mmol/L (ref 98–111)
Creatinine, Ser: 0.39 mg/dL — ABNORMAL LOW (ref 0.44–1.00)
GFR, Estimated: >60 mL/min (ref >60)
Glucose, Bld: 101 mg/dL — ABNORMAL HIGH (ref 70–99)
Potassium: 3.5 mmol/L (ref 3.5–5.1)
Sodium: 134 mmol/L — ABNORMAL LOW (ref 135–145)
Total Bilirubin: 1 mg/dL (ref 0.3–1.2)
Total Protein: 6.8 g/dL (ref 6.5–8.1)

## 2022-06-29 LAB — CBC WITH DIFFERENTIAL/PLATELET
Abs Immature Granulocytes: 0.03 10*3/uL (ref 0.00–0.07)
Basophils Absolute: 0 10*3/uL (ref 0.0–0.1)
Basophils Relative: 1 %
Eosinophils Absolute: 0.1 10*3/uL (ref 0.0–0.5)
Eosinophils Relative: 2 %
HCT: 31.2 % — ABNORMAL LOW (ref 36.0–46.0)
Hemoglobin: 9.9 g/dL — ABNORMAL LOW (ref 12.0–15.0)
Immature Granulocytes: 1 %
Lymphocytes Relative: 12 %
Lymphs Abs: 0.8 10*3/uL (ref 0.7–4.0)
MCH: 32.6 pg (ref 26.0–34.0)
MCHC: 31.7 g/dL (ref 30.0–36.0)
MCV: 102.6 fL — ABNORMAL HIGH (ref 80.0–100.0)
Monocytes Absolute: 1.1 10*3/uL — ABNORMAL HIGH (ref 0.1–1.0)
Monocytes Relative: 16 %
Neutro Abs: 4.5 10*3/uL (ref 1.7–7.7)
Neutrophils Relative %: 68 %
Platelets: 216 10*3/uL (ref 150–400)
RBC: 3.04 MIL/uL — ABNORMAL LOW (ref 3.87–5.11)
RDW: 16.4 % — ABNORMAL HIGH (ref 11.5–15.5)
WBC: 6.5 10*3/uL (ref 4.0–10.5)
nRBC: 0 % (ref 0.0–0.2)

## 2022-06-29 LAB — MAGNESIUM: Magnesium: 1.8 mg/dL (ref 1.7–2.4)

## 2022-06-29 LAB — PROTIME-INR
INR: 1.1 (ref 0.8–1.2)
Prothrombin Time: 13.8 seconds (ref 11.4–15.2)

## 2022-06-29 MED ORDER — ESCITALOPRAM OXALATE 10 MG PO TABS: 10.0000 mg | ORAL_TABLET | Freq: Every day | ORAL | Status: DC

## 2022-06-29 MED ORDER — ENSURE MAX PROTEIN PO LIQD
11.0000 [oz_av] | Freq: Every day | ORAL | Status: DC
  Administered 2022-06-30 – 2022-07-01 (×2): 11 [oz_av] via ORAL
  Filled 2022-06-29 (×3): qty 330

## 2022-06-29 MED ORDER — CHLORHEXIDINE GLUCONATE CLOTH 2 % EX PADS
6.0000 | MEDICATED_PAD | Freq: Every day | CUTANEOUS | Status: DC
  Administered 2022-06-29 – 2022-07-01 (×3): 6 via TOPICAL

## 2022-06-29 MED ORDER — FLUTICASONE PROPIONATE 50 MCG/ACT NA SUSP
1.0000 | Freq: Every day | NASAL | Status: DC
  Administered 2022-07-01: 1 via NASAL
  Filled 2022-06-29: qty 16

## 2022-06-29 MED ORDER — HYDROMORPHONE HCL 2 MG/ML IJ SOLN
2.0000 mg | INTRAMUSCULAR | Status: DC | PRN
  Administered 2022-06-29 – 2022-06-30 (×3): 2 mg via INTRAVENOUS
  Filled 2022-06-29 (×3): qty 1

## 2022-06-29 MED ORDER — HYDROCHLOROTHIAZIDE 25 MG PO TABS
25.0000 mg | ORAL_TABLET | Freq: Every day | ORAL | Status: DC
  Administered 2022-06-29 – 2022-07-01 (×3): 25 mg via ORAL
  Filled 2022-06-29 (×2): qty 1

## 2022-06-29 MED ORDER — LIDOCAINE 5 % EX PTCH
1.0000 | MEDICATED_PATCH | CUTANEOUS | Status: DC
  Administered 2022-06-29: 1 via TRANSDERMAL
  Filled 2022-06-29: qty 1

## 2022-06-29 MED ORDER — POTASSIUM CHLORIDE CRYS ER 20 MEQ PO TBCR
40.0000 meq | EXTENDED_RELEASE_TABLET | Freq: Every day | ORAL | Status: DC
  Administered 2022-06-29 – 2022-07-01 (×3): 40 meq via ORAL
  Filled 2022-06-29 (×3): qty 2

## 2022-06-29 MED ORDER — SODIUM CHLORIDE 0.9 % IV SOLN
40.0000 mg | Freq: Every day | INTRAVENOUS | Status: DC
  Administered 2022-06-29 – 2022-06-30 (×2): 40 mg via INTRAVENOUS
  Filled 2022-06-29 (×3): qty 4

## 2022-06-29 MED ORDER — LORAZEPAM 2 MG/ML IJ SOLN: 1.0000 mg | Freq: Once | INTRAMUSCULAR | Status: DC

## 2022-06-29 MED ORDER — CYCLOBENZAPRINE HCL 10 MG PO TABS
10.0000 mg | ORAL_TABLET | Freq: Three times a day (TID) | ORAL | Status: DC | PRN
  Administered 2022-06-29 – 2022-06-30 (×3): 10 mg via ORAL
  Filled 2022-06-29 (×3): qty 1

## 2022-06-29 MED ORDER — FENTANYL 50 MCG/HR TD PT72
1.0000 | MEDICATED_PATCH | TRANSDERMAL | Status: DC
  Administered 2022-06-29: 1 via TRANSDERMAL
  Filled 2022-06-29: qty 1

## 2022-06-29 MED ORDER — ESCITALOPRAM OXALATE 10 MG PO TABS
10.0000 mg | ORAL_TABLET | Freq: Every day | ORAL | Status: DC
  Administered 2022-06-29 – 2022-06-30 (×3): 10 mg via ORAL
  Filled 2022-06-29 (×3): qty 1

## 2022-06-29 MED ORDER — GABAPENTIN 300 MG PO CAPS
300.0000 mg | ORAL_CAPSULE | Freq: Three times a day (TID) | ORAL | Status: DC
  Administered 2022-06-29 – 2022-07-01 (×7): 300 mg via ORAL
  Filled 2022-06-29 (×7): qty 1

## 2022-06-29 MED ORDER — DEXAMETHASONE SODIUM PHOSPHATE 4 MG/ML IJ SOLN: 40.0000 mg | INTRAMUSCULAR | Status: DC

## 2022-06-29 MED ORDER — PANTOPRAZOLE SODIUM 40 MG PO TBEC
40.0000 mg | DELAYED_RELEASE_TABLET | Freq: Every day | ORAL | Status: DC
  Administered 2022-06-29 – 2022-07-01 (×3): 40 mg via ORAL
  Filled 2022-06-29 (×3): qty 1

## 2022-06-29 NOTE — Assessment & Plan Note (Signed)
Hgb stable, no clinical bleeding 

## 2022-06-29 NOTE — Assessment & Plan Note (Signed)
-   Consult Onc, appreciate cares

## 2022-06-29 NOTE — Progress Notes (Signed)
  Progress Note   Patient: Makayla Good WLS:[id number] DOB: January 11, 1971 DOA: 06/28/2022     0 DOS: the patient was seen and examined on 06/29/2022 at 9:00AM      Brief hospital course: Makayla Good is a 52 y.o. 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  Dr. [name] started fentanyl patch, gabapentin and Decadron this morning, and patient thought her pain was beter controlled, but could only lay flat 7 min, not the 25 min required for the spine mets screening MRI needed. - Hold MRI - Continue Duragesic - Continue Decadron - Continue gabapentin, Ativan    Anemia of chronic disease Hgb stable, no clinical bleeding  GAD (generalized anxiety disorder) - Continue Lexapro  Pancreatic adenocarcinoma (HCC) - Consult Onc, appreciate cares  Hypokalemia - Supp K  Essential hypertension BP slightly high - Resume HCTZ          Subjective: Feels better, no confusion, no fever, no respiratory symptoms.  Failed MRI again this mroning.     Physical Exam: BP (!) 143/95   Pulse (!) 119   Temp 98.4 F (36.9 C) (Oral)   Resp 18   Ht 5\' 5"  (1.651 m)   Wt 74.8 kg   LMP 07/27/2020 (Within Days)   SpO2 100%   BMI 27.46 kg/m   Adult female, lying in bed, no acute distress, laying on her left side, appears comfortable, interactive and appropriate Tachycardic, regular, no murmurs, no peripheral edema Respiratory rate normal, lungs clear without rales or wheezes Abdomen soft without tenderness palpation or guarding, no distention Attention normal, affect normal, judgment and insight appear normal    Data Reviewed: Discussed with oncology via secure chat Basic metabolic panel shows mild hypokalemia CBC shows mild anemia,  otherwise white blood cells and platelets normal         Disposition: Status is: Inpatient Will require ongoing IV opiate pain control        Author: Edwin Dada, MD 06/29/2022 11:34 AM  For on call review [email or web address].

## 2022-06-29 NOTE — Consult Note (Signed)
Makayla Good is well-known to me.  Makayla Good is a very nice 52 year old African-American female.  Makayla Good has metastatic adenocarcinoma of the pancreas.  Makayla Good is the rare patient in which the cancer presented in Makayla Good back.  Makayla Good has had known spinal metastasis.  Makayla Good has had radiation therapy to the back..  Makayla Good has been on systemic chemotherapy.  Makayla Good has had 3 cycles of FOLFOXIRI.  Makayla Good last chemotherapy was probably about 3 to 4 weeks ago.  Makayla Good recently had a CT scan which unfortunately showed progressive disease.  Makayla Good has been having worsening pain in the back and now in the right leg.  Makayla Good was taken to the emergency room.  Makayla Good had MRI of the spine.  Unfortunately this was a suboptimal study because of patient movement.  There was a lot of spinal stenosis.  Down the lumbar spine, there appear to be some evidence of tumor extension into the right L3 nerve root.  It was recommended another MRI be done to try to get a better picture.  Makayla Good labs show sodium 136.  Potassium 3.3.  BUN 10 creatinine 0.41.  Albumin 3.5.  Makayla Good CBC shows white cell count 7.2.  Hemoglobin 10.  Platelet count 214,000.  Makayla Good clearly needs to have a better pain regimen.  I was going to talk to Makayla Good about changing of Makayla Good chemotherapy protocol.  We can certainly do this.  Makayla Good still is in good shape.  Makayla Good has had no nausea or vomiting.  Makayla Good has had no diarrhea.  There has been no cough or shortness of breath.  Makayla Good has had maybe has some leg weakness, more so in the knees.  There is been no leg swelling.  Makayla Good has had no bleeding.  There has been no fever.   Makayla Good vital signs show temperature of 98.7.  Pulse 120.  Blood pressure 149/112.  Oxygen saturation is 100% on room air.  Weight is 165 pounds.  Makayla Good head and neck exam shows no scleral icterus.  There is no adenopathy.  Lungs are clear bilaterally.  Cardiac exam regular rate and rhythm.  Abdomen is soft.  Bowel sounds are present.  Makayla Good has no fluid wave.  There is no guarding or rebound tenderness.   There is no palpable liver or spleen tip.  Extremity shows no clubbing, cyanosis or edema.  Makayla Good has good sensation in Makayla Good legs.  Makayla Good has good range of motion of Makayla Good joints.  There might be little bit of symmetrical weakness in Makayla Good knees.  Neurological exam shows no focal neurological deficits.    Makayla Good is a very nice 52 year old African-American female with metastatic pancreatic cancer.  Of note, Makayla Good last CA 19-9 was up a little bit.  It was 83.  When we first started treatment, Makayla Good CA 19-9 was 236.  Again, we really need to see what is going on in the lumbar spine.  Hopefully, it should be able to do another MRI to get a better view as to what is going on.  Makayla Good is already had radiation therapy to the back.  I will go ahead and try Makayla Good on a Duragesic patch.  I will try Makayla Good on 50 mcg patch.  I also think that some Decadron might help Makayla Good as an anti-inflammatory.  I also think that gabapentin might work as a agent to help with neuropathic pain.  Again Makayla Good has a Port-A-Cath in.  I am not sure why this is not accessed.  Makayla Good really needs to have  this accessed.  I am not sure if Makayla Good is going to be admitted.  I would think that Makayla Good would be.  If Makayla Good is admitted, we will follow Makayla Good along.   Makayla Haw, MD  Vonna Kotyk 1:9

## 2022-06-29 NOTE — Assessment & Plan Note (Signed)
BP slightly high - Resume HCTZ

## 2022-06-29 NOTE — Assessment & Plan Note (Signed)
Attempted MRI overnight x4 without success.  Dr. [name] started fentanyl patch, gabapentin and Decadron this morning, and patient thought her pain was beter controlled, but could only lay flat 7 min, not the 25 min required for the spine mets screening MRI needed. - Hold MRI - Continue Duragesic - Continue Decadron - Continue gabapentin, Ativan

## 2022-06-29 NOTE — Hospital Course (Addendum)
Mrs. Makayla Good is a 52 y.o. 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

## 2022-06-29 NOTE — ED Notes (Signed)
Pt sitting on chair in room. Pain meds given per md order. Call bell in reach. nad

## 2022-06-29 NOTE — Assessment & Plan Note (Signed)
Continue Lexapro

## 2022-06-29 NOTE — ED Notes (Signed)
Report from [place]

## 2022-06-29 NOTE — Assessment & Plan Note (Signed)
-   Supp K 

## 2022-06-30 ENCOUNTER — Inpatient Hospital Stay (HOSPITAL_COMMUNITY): Payer: BC Managed Care – PPO

## 2022-06-30 DIAGNOSIS — G893 Neoplasm related pain (acute) (chronic): Secondary | ICD-10-CM

## 2022-06-30 DIAGNOSIS — C7951 Secondary malignant neoplasm of bone: Secondary | ICD-10-CM | POA: Diagnosis not present

## 2022-06-30 MED ORDER — ORAL CARE MOUTH RINSE: 15.0000 mL | OROMUCOSAL | Status: DC | PRN

## 2022-06-30 MED ORDER — POLYETHYLENE GLYCOL 3350 17 G PO PACK
17.0000 g | PACK | Freq: Two times a day (BID) | ORAL | Status: DC
  Administered 2022-06-30 – 2022-07-01 (×3): 17 g via ORAL
  Filled 2022-06-30 (×4): qty 1

## 2022-06-30 MED ORDER — HYDROMORPHONE HCL 2 MG PO TABS
2.0000 mg | ORAL_TABLET | ORAL | Status: DC | PRN
  Administered 2022-06-30 – 2022-07-01 (×2): 2 mg via ORAL
  Filled 2022-06-30 (×2): qty 1

## 2022-06-30 NOTE — Progress Notes (Signed)
Ms. Rutherford feels a lot better.  She still has little bit of back discomfort but is able to move around more.  She is able to move her legs better.  She is eating well.  I am sure the Decadron has something to do with this.  I think she is ready to try a MRI now.  Hopefully, she will be able to lie down flat.  There are no labs yet today.  She is having a bowel movement.  She probably needs to be on a good laxative program.  I will try MiraLAX twice a day.  We should also see about getting her on oral short-term pain medication.  I will try her on oral Dilaudid.  She is on a Duragesic patch now.  She is also on gabapentin.  Hopefully both of these are helping with her pain.  She is eating.  There is no nausea or vomiting.  She is having no rashes.  She has no leg swelling.  Her heart rate is up a little bit.  Her vital signs show temperature 98.7.  Pulse 110.  Blood pressure 125/81.  Her head neck exam shows no scleral icterus.  There is no adenopathy in the neck.  Lungs are clear bilaterally.  Cardiac exam is tachycardic but regular.  Abdominal exam is soft.  Bowel sounds are present.  There is no guarding or rebound tenderness.  Extremity shows no clubbing, cyanosis or edema.  She has good range of motion of her joints.  She has good strength in her legs.  Neurological exam shows no focal neurological deficits.  Again, it would be nice to try for an MRI today.  She thinks she can do this now.  She really would like to go home.  From my point of view, I think we probably could get her home.  She is on the Duragesic patch.  She is on gabapentin.  I think oral Dilaudid might help [place] out for short-term pain exacerbations.  We need to make sure that she does not get constipated.  I do appreciate the great care that she has gotten from everybody up on 5 E.  Lattie Haw, MD  [place] 12:12

## 2022-06-30 NOTE — Progress Notes (Signed)
  Progress Note   Patient: Makayla Good [id number]:[id number] DOB: 12-Nov-1970 DOA: 06/28/2022     1 DOS: the patient was seen and examined on 06/30/2022        Brief hospital course: Makayla Good is a 52 y.o. 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 (HCC) Improved but still limiting her significantly, still on IV opiates and IV Decadron - Continue Duragesic patch - STop IV opiates, trial oral Dilaudid - Continue IV Decadron - Continue gabapentin, Ativan     GAD (generalized anxiety disorder) - Continue Lexapro     Essential hypertension - Continue HCTZ          Subjective: Still with pain, but improving, no fever, cough, dyspnea, vomiting, confusion     Physical Exam: BP (!) 144/96 (BP Location: Right Arm)   Pulse (!) 101   Temp 98.9 F (37.2 C) (Oral)   Resp 17   Ht 5\' 5"  (1.651 m)   Wt 74.7 kg   LMP 07/27/2020 (Within Days)   SpO2 99%   BMI 27.41 kg/m   Adult female, sitting up in bed, interactive and appropriate RRR, no murmurs, no peripheral edema Respiratory rate normal, lungs clear without rales or wheezes Tachycardic Attention normal, affect normal, judgment insight appear normal    Data Reviewed: No new labs  Family Communication: Family member at the bedside    Disposition: Status is: Inpatient Patient still requiring IV opiates, IV Decadron Hopefully overnight this improved, and by tomorrow we can transition to a fully oral regimen at discharge        Author: Edwin Dada, MD 06/30/2022 4:24 PM  For on call review [email or web address]

## 2022-07-01 MED ORDER — DEXAMETHASONE 4 MG PO TABS: 4.0000 mg | ORAL_TABLET | Freq: Three times a day (TID) | ORAL | 1 refills | Status: DC

## 2022-07-01 MED ORDER — HYDROMORPHONE HCL 2 MG PO TABS: 2.0000 mg | ORAL_TABLET | ORAL | 0 refills | Status: DC | PRN

## 2022-07-01 MED ORDER — ACETAMINOPHEN 500 MG PO TABS: 500.0000 mg | ORAL_TABLET | Freq: Four times a day (QID) | ORAL | Status: DC | PRN

## 2022-07-01 MED ORDER — DEXAMETHASONE SODIUM PHOSPHATE 10 MG/ML IJ SOLN
20.0000 mg | Freq: Once | INTRAMUSCULAR | Status: AC
  Administered 2022-07-01: 20 mg via INTRAVENOUS
  Filled 2022-07-01 (×2): qty 2

## 2022-07-01 MED ORDER — FENTANYL 50 MCG/HR TD PT72: 1.0000 | MEDICATED_PATCH | TRANSDERMAL | 0 refills | Status: DC

## 2022-07-01 MED ORDER — HEPARIN SOD (PORK) LOCK FLUSH 100 UNIT/ML IV SOLN
500.0000 [IU] | INTRAVENOUS | Status: AC | PRN
  Administered 2022-07-01: 500 [IU]

## 2022-07-01 MED ORDER — GABAPENTIN 300 MG PO CAPS: 300.0000 mg | ORAL_CAPSULE | Freq: Three times a day (TID) | ORAL | 1 refills | Status: DC

## 2022-07-01 NOTE — Plan of Care (Signed)

## 2022-07-01 NOTE — Plan of Care (Signed)
Problem: Education: Goal: Knowledge of General Education information will improve Description: Including pain rating scale, medication(s)/side effects and non-pharmacologic comfort measures 07/01/2022 1331 by Vernie Shanks, RN Outcome: Completed/Met 07/01/2022 1331 by Vernie Shanks, RN Outcome: Adequate for Discharge 07/01/2022 1116 by Vernie Shanks, RN Outcome: Progressing   Problem: Health Behavior/Discharge Planning: Goal: Ability to manage health-related needs will improve 07/01/2022 1331 by Vernie Shanks, RN Outcome: Completed/Met 07/01/2022 1331 by Vernie Shanks, RN Outcome: Adequate for Discharge 07/01/2022 1116 by Vernie Shanks, RN Outcome: Progressing   Problem: Clinical Measurements: Goal: Ability to maintain clinical measurements within normal limits will improve 07/01/2022 1331 by Vernie Shanks, RN Outcome: Completed/Met 07/01/2022 1331 by Vernie Shanks, RN Outcome: Adequate for Discharge 07/01/2022 1116 by Vernie Shanks, RN Outcome: Progressing Goal: Will remain free from infection 07/01/2022 1331 by Vernie Shanks, RN Outcome: Completed/Met 07/01/2022 1331 by Vernie Shanks, RN Outcome: Adequate for Discharge 07/01/2022 1116 by Vernie Shanks, RN Outcome: Progressing Goal: Diagnostic test results will improve 07/01/2022 1331 by Vernie Shanks, RN Outcome: Completed/Met 07/01/2022 1331 by Vernie Shanks, RN Outcome: Adequate for Discharge 07/01/2022 1116 by Vernie Shanks, RN Outcome: Progressing Goal: Respiratory complications will improve 07/01/2022 1331 by Vernie Shanks, RN Outcome: Completed/Met 07/01/2022 1331 by Vernie Shanks, RN Outcome: Adequate for Discharge 07/01/2022 1116 by Vernie Shanks, RN Outcome: Progressing Goal: Cardiovascular complication will be avoided 07/01/2022 1331 by Vernie Shanks, RN Outcome: Completed/Met 07/01/2022 1331 by Vernie Shanks, RN Outcome: Adequate for  Discharge 07/01/2022 1116 by Vernie Shanks, RN Outcome: Progressing   Problem: Activity: Goal: Risk for activity intolerance will decrease 07/01/2022 1331 by Vernie Shanks, RN Outcome: Completed/Met 07/01/2022 1331 by Vernie Shanks, RN Outcome: Adequate for Discharge 07/01/2022 1116 by Vernie Shanks, RN Outcome: Progressing   Problem: Nutrition: Goal: Adequate nutrition will be maintained 07/01/2022 1331 by Vernie Shanks, RN Outcome: Completed/Met 07/01/2022 1331 by Vernie Shanks, RN Outcome: Adequate for Discharge 07/01/2022 1116 by Vernie Shanks, RN Outcome: Progressing   Problem: Coping: Goal: Level of anxiety will decrease 07/01/2022 1331 by Vernie Shanks, RN Outcome: Completed/Met 07/01/2022 1331 by Vernie Shanks, RN Outcome: Adequate for Discharge 07/01/2022 1116 by Vernie Shanks, RN Outcome: Progressing   Problem: Elimination: Goal: Will not experience complications related to bowel motility 07/01/2022 1331 by Vernie Shanks, RN Outcome: Completed/Met 07/01/2022 1331 by Vernie Shanks, RN Outcome: Adequate for Discharge 07/01/2022 1116 by Vernie Shanks, RN Outcome: Progressing Goal: Will not experience complications related to urinary retention 07/01/2022 1331 by Vernie Shanks, RN Outcome: Completed/Met 07/01/2022 1331 by Vernie Shanks, RN Outcome: Adequate for Discharge 07/01/2022 1116 by Vernie Shanks, RN Outcome: Progressing   Problem: Pain Managment: Goal: General experience of comfort will improve 07/01/2022 1331 by Vernie Shanks, RN Outcome: Completed/Met 07/01/2022 1331 by Vernie Shanks, RN Outcome: Adequate for Discharge 07/01/2022 1116 by Vernie Shanks, RN Outcome: Progressing   Problem: Safety: Goal: Ability to remain free from injury will improve 07/01/2022 1331 by Vernie Shanks, RN Outcome: Completed/Met 07/01/2022 1331 by Vernie Shanks, RN Outcome: Adequate for  Discharge 07/01/2022 1116 by Vernie Shanks, RN Outcome: Progressing   Problem: Skin Integrity: Goal: Risk for impaired skin integrity will decrease 07/01/2022 1331 by Vernie Shanks, RN Outcome: Completed/Met 07/01/2022 1331 by Vernie Shanks, RN Outcome: Adequate for Discharge 07/01/2022 1116 by Vernie Shanks, RN  Outcome: Progressing   

## 2022-07-01 NOTE — Discharge Summary (Signed)
Physician Discharge Summary   Patient: Kiora Shafer MRN: [id number] DOB: 12/05/1970  Admit date:     06/28/2022  Discharge date: 07/01/22  Discharge Physician: Edwin Dada   PCP: [name], DO     Recommendations at discharge:  Follow up with Dr. Tammi Klippel Radiation Oncology this week for worsening pain due to lumbosacral metastases and nerve impingement Dr. 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 Vock is a 52 y.o. 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  Patient admitted and transitioned from PRN oxycodone 10 to fentanyl patch 50 mcg, gabapentin 300 TID and PO Dilaudid '2mg'$ .  Decadron increased to 40 mg daily for 3 days.  She had improvement in her pain and was able to walk/transfer independently.  She had no leg weakness, minimal numbness, and so no surgical intervention considered necessary.  Case was discussed with Radiation Oncology who will discuss with tumor board tomorrow and develop further treatment plan.            The [facility] Controlled Substances Registry was reviewed for this patient prior to discharge.   Consultants: Oncology, Radiation Oncology Procedures performed: MRI lumbar spine  Disposition: Home Diet recommendation:  Regular  DISCHARGE MEDICATION: Allergies as of 07/01/2022   No Known  Allergies      Medication List     STOP taking these medications    HYDROcodone bit-homatropine 5-1.5 MG/5ML syrup Commonly known as: HYCODAN   Oxycodone HCl 10 MG Tabs       TAKE these medications    acetaminophen 500 MG tablet Commonly known as: TYLENOL Take 1 tablet (500 mg total) by mouth every 6 (six) hours as needed for mild pain (or Fever >/= 101). What changed:  medication strength how much to take   cyclobenzaprine 10 MG tablet Commonly known as: FLEXERIL TAKE 1 TABLET BY MOUTH AT BEDTIME AS NEEDED FOR MUSCLE SPASMS What changed: See the new instructions.   dexamethasone 4 MG tablet Commonly known as: DECADRON Take 1 tablet (4 mg total) by mouth 3 (three) times daily. What changed:  how much to take when to take this additional instructions   Ensure Max Protein Liqd Take 330 mLs (11 oz total) by mouth daily.   escitalopram 10 MG tablet Commonly known as: LEXAPRO TAKE 1 TABLET BY MOUTH EVERY DAY   fentaNYL 50 MCG/HR Commonly known as: [place] 1 patch onto the skin every 3 (three) days. Start taking on: July 02, 2022   fluticasone 50 MCG/ACT nasal spray Commonly known as: FLONASE SPRAY 2 SPRAYS INTO EACH NOSTRIL EVERY DAY What changed: See the new instructions.   gabapentin 300 MG capsule Commonly known as: NEURONTIN Take 1 capsule (300 mg total) by mouth 3 (three) times daily.   hydrochlorothiazide 25 MG tablet Commonly known as: HYDRODIURIL Take 1 tablet (25 mg total) by mouth daily.   HYDROmorphone 2  MG tablet Commonly known as: DILAUDID Take 1 tablet (2 mg total) by mouth every 4 (four) hours as needed for severe pain.   Ipratropium-Albuterol 20-100 MCG/ACT Aers respimat Commonly known as: COMBIVENT Inhale 1 puff into the lungs 3 (three) times daily for 5 days, THEN 1 puff every 6 (six) hours as needed for wheezing. Start taking on: May 12, 2022   Klor-Con M20 20 MEQ tablet Generic drug: potassium chloride SA TAKE 2  TABLETS BY MOUTH DAILY What changed: how much to take   lidocaine-prilocaine cream Commonly known as: EMLA Apply to affected area once   loperamide 2 MG capsule Commonly known as: IMODIUM Take 2 tabs by mouth with first loose stool, then 1 tab with each additional loose stool as needed. Do not exceed 8 tabs in a 24-hour period What changed:  how much to take how to take this when to take this reasons to take this additional instructions   LORazepam 0.5 MG tablet Commonly known as: ATIVAN Take 1 tablet (0.5 mg total) by mouth every 8 (eight) hours as needed for anxiety.   omeprazole 40 MG capsule Commonly known as: PRILOSEC Take 1 capsule (40 mg total) by mouth daily.   ondansetron 8 MG tablet Commonly known as: Zofran Take 1 tablet (8 mg total) by mouth every 8 (eight) hours as needed for nausea or vomiting. Start on the third day after cisplatin   prochlorperazine 10 MG tablet Commonly known as: COMPAZINE Take 1 tablet (10 mg total) by mouth every 6 (six) hours as needed for nausea or vomiting (Nausea or vomiting).   temazepam 15 MG capsule Commonly known as: RESTORIL Take 1 capsule (15 mg total) by mouth at bedtime as needed for sleep.        Follow-up Information     Tyler Pita, MD. Call in 1 day(s).   Specialty: Radiation Oncology Contact information: [place] [address] [phone]                 Discharge Instructions     Discharge instructions   Complete by: As directed    From Dr. Loleta Books: You were admitted for back and leg pain Your MRI shows that this is from progression of the cancer into the spine  You were started on a few new pain medicines: Fentanyl patch Gabapentin (nerve medicine) Hydromorphone tablets (instead of oxycodone, for breakthrough pain) And your dose of dexamethasone was increased   You should take the fentanyl patch, and replace every 3 days You should also take the gabapentin 300  mg three times daily to suppress nerve pain And you should use the dexamethasone 4 mg three times daily until you see Dr. Tammi Klippel, then ask him if you should reduce the dose  While you are taking this steroid, take your omeprazole daily to protect your stomach from ulcers  If you have breakthrough pain, you should take hydromorphone (Dilaudid) 2 mg  Take this INSTEAD OF oxycodone  If you ever take the Dilaudid, you should also take a dose of acetaminophen 500 mg  Call Dr. Johny Shears office this week for a follow up   Increase activity slowly   Complete by: As directed        Discharge Exam: Filed Weights   06/28/22 0735 06/29/22 1229 06/30/22 0546  Weight: 74.8 kg 76.9 kg 74.7 kg    General: Pt is alert, awake, not in acute distress Cardiovascular: RRR, nl S1-S2, no murmurs appreciated.   No LE edema.  Respiratory: Normal respiratory rate and rhythm.  CTAB without rales or wheezes. Abdominal: Abdomen soft and non-tender.  No distension or HSM.   Neuro/Psych: Strength symmetric in upper and lower extremities.  Judgment and insight appear normal .   Condition at discharge: fair  The results of significant diagnostics from this hospitalization (including imaging, microbiology, ancillary and laboratory) are listed below for reference.   Imaging Studies: MR Lumbar Spine W Wo Contrast  Result Date: 06/30/2022 CLINICAL DATA:  Low back pain, metastatic disease suspected EXAM: MRI LUMBAR SPINE WITHOUT AND WITH CONTRAST TECHNIQUE: Multiplanar and multiecho pulse sequences of the lumbar spine were obtained without and with intravenous contrast. CONTRAST:  7.44m GADAVIST GADOBUTROL 1 MMOL/ML IV SOLN COMPARISON:  06/28/2022 FINDINGS: Segmentation: 5 lumbar type vertebral bodies. The lowest fully formed disc space is labeled L5-S1. Alignment: Mild levocurvature. Straightening of the normal lumbar lordosis. 3 mm retrolisthesis of L5 on S1. Vertebrae: T1 hypointense, T2 hyperintense, and contrast  enhancing lesions are seen in L1, L3, L4, L5, in the sacrum, and in the bilateral iliac bones. Metastatic narrowing of the right neural foramen is noted at L1 (series 12, image 12 and series 13, image 5). Metastatic extension into the spinal canal is noted at L3 (series 12, image 21). Metastatic extension into the spinal canal and right neural foramina is seen in the right sacrum (series 12, image 36 and 38; series 13, image 2), where a large mass involving the right aspect of S1 and S2 encases and compresses the exiting right S1 nerve, which appears abnormally enhanced, and narrows the sacral spinal canal. This mass measures approximately 4.3 x 5.9 x 5.6 cm (AP x TR x CC) (series 13, image 2 and series 12, image 37). Conus medullaris and cauda equina: Conus extends to the L2 level. As described above, there is abnormal enhancement in the exiting right S1 nerve (series 12, image 36-40). Conus and cauda equina otherwise appear normal. Paraspinal and other soft tissues: Several T2 hypointense but nonenhancing lesions are noted in the left kidney, which are better evaluated on the recent CT abdomen pelvis. Disc levels: T12-L1: No significant disc bulge. No spinal canal stenosis or neural foraminal narrowing. L1-L2: No significant disc bulge. No spinal canal stenosis. Moderate right neural foraminal narrowing. L2-L3: Minimal disc bulge with left greater than right foraminal protrusion. Mild facet arthropathy. Mild spinal canal stenosis. Narrowing of the lateral recesses. No neural foraminal narrowing. L3-L4: No significant disc bulge. Metastatic disease at the posteroinferior aspect of L3 contributes to moderate to severe spinal canal stenosis. Effacement of the lateral recesses. Mild left neural foraminal narrowing. L4-L5: Disc height loss and moderate disc bulge. Mild facet arthropathy. Ligamentum flavum hypertrophy. Moderate to severe spinal canal stenosis. Effacement of the lateral recesses. No neural foraminal  narrowing. L5-S1: Trace retrolisthesis. Disc height loss and disc osteophyte complex. No spinal canal stenosis at the disc level. Posterior to L1, there is encroachment by the aforementioned mass, which does not cause significant spinal canal stenosis, but which effaces the right lateral recesses. Moderate bilateral neural foraminal narrowing. IMPRESSION: 1. Multiple osseous lesions in the lumbar spine, sacrum, and bilateral iliac bones, consistent with metastatic disease. 2. Enhancing mass at the posteroinferior aspect of L3 contributes to moderate to severe spinal canal stenosis at L3-L4 and effacement of the lateral recesses, which could affect the descending L4 nerve roots. There is also mild left neural foraminal narrowing at L3-L4. 3. Enhancing mass involving the right aspect of S1 and S2, which encases and compresses  the exiting right S1 nerve, which appears abnormally enhanced. The mass narrows the sacral spinal canal without significant spinal canal stenosis. 4. L4-L5 moderate to severe spinal canal stenosis. Effacement of the lateral recesses at this level likely compresses the descending L5 nerve roots. 5. L5-S1 moderate bilateral neural foraminal narrowing. 6. L1-L2 moderate right neural foraminal narrowing. 7. L2-L3 mild spinal canal stenosis. These results will be called to the ordering clinician or representative by the Radiologist Assistant, and communication documented in the PACS or [employer]. Electronically Signed   By: Merilyn Baba M.D.   On: 06/30/2022 19:59   MR LUMBAR SPINE WO CONTRAST  Result Date: 06/29/2022 CLINICAL DATA:  Back and leg pain, metastatic disease, staging EXAM: MRI LUMBAR SPINE WITHOUT CONTRAST TECHNIQUE: Multiplanar, multisequence MR imaging of the lumbar spine was performed. No intravenous contrast was administered. COMPARISON:  03/05/2022 MRI lumbar spine, 06/28/2022 MRI thoracic spine, 06/22/2022 CT abdomen pelvis FINDINGS: Evaluation is significantly limited by  motion artifact. The only mildly usable sequence is the sagittal T2. No axial sequences were obtained. No contrast could be given. Segmentation: 5 lumbar type vertebral bodies. The lowest fully formed disc space is labeled L5-S1. Alignment: Minimal levocurvature of the lumbar spine. Straightening of the normal lumbar lordosis. No significant listhesis. Vertebrae: Redemonstrated abnormal signal throughout the lumbar spine and imaged sacrum, largely sparing the L2 vertebral body. The amount of bulky tumor at the inferior posterior aspect of L3 has increased since the prior exam, with extension into the ventral epidural space. Overall stable vertebral body heights. Conus medullaris and cauda equina: Conus extends to the L1 level. Conus and cauda equina demonstrate normal signal 4 T2 weighted imaging. Paraspinal and other soft tissues: Evaluation for lymphadenopathy is limited by the sequences obtained. Disc levels: Unable to fully evaluate. Moderate to severe spinal canal stenosis is likely at L3-L4 and L4-L5, similar to prior IMPRESSION: 1. Evaluation is significantly limited by motion artifact. The only mildly usable sequence is the sagittal T2 sequence. No axial sequences were obtained. No contrast could be given. Recommend repeating this MRI when the patient is better able to tolerate the exam. 2. Redemonstrated diffuse osseous metastatic disease throughout the lumbar spine and imaged sacrum. The amount of bulky tumor at the inferior posterior aspect of L3 has increased since the prior MRI, with redemonstrated extension into the ventral epidural space. 3. Moderate to severe spinal canal stenosis is likely at L3-L4 and L4-L5, similar to prior, although incompletely evaluated without axial sequences. Electronically Signed   By: Merilyn Baba M.D.   On: 06/29/2022 00:09   MR CERVICAL SPINE WO CONTRAST  Result Date: 06/28/2022 CLINICAL DATA:  Staging.  Known metastatic disease. EXAM: MRI CERVICAL AND THORACIC SPINE  WITHOUT CONTRAST TECHNIQUE: Multiplanar and multiecho pulse sequences of the cervical spine, to include the craniocervical junction and cervicothoracic junction, and the thoracic spine, were obtained without intravenous contrast. COMPARISON:  03/05/22 MRI C SPine and MRI T SPine FINDINGS: Limitations: Incomplete exam due to patient intolerance. Patient was unable to tolerate supine positioning. Thoracic spine was incompletely imaged and only the sagittal T2 and T1 weighted sequences were acquired. MRI CERVICAL SPINE FINDINGS Alignment: There is straightening of the normal cervical lordosis. There is mild retrolisthesis of C5 on C6, unchanged from prior exam. Vertebrae: Redemonstrated are T2/stir hyperintense lesions at the C4 and C5 vertebral body levels. There are additional T1 hypointense lesions at the C7, T2, and T4 levels. The lesions were present on prior exam. Redemonstrated is a compression deformity  of the T4 level, which difficult to compare, but is likely unchanged compared to prior exam. Cord: No evidence of cord signal abnormality. Posterior Fossa, vertebral arteries, paraspinal tissues: A 9 mm thyroid nodule in the right thyroid gland. Disc levels: Redemonstrated severe spinal canal stenosis at the C6 vertebral body level, likely unchanged from prior exam. MRI THORACIC SPINE FINDINGS Alignment:  Physiologic. Vertebrae: Redemonstrated are T1 hypointense lesions in the T2, T4, T9, T10 L1 vertebral body levels. There is no definite evidence of a new lesion. Cord: Redemonstrated spinal canal stenosis of the T8-T9 levels secondary to a combination of the disc bulge and possible ligamentum flavum hypertrophy. There is likely mild stenosis of the T4 level, which is also unchanged from prior exam. Paraspinal and other soft tissues: Negative. Disc levels: Axial sequences were not acquired due to patient intolerance. Within this limitation, there is likely moderate to severe spinal canal stenosis at T8-T9 level,  unchanged from prior exam. There is likely also at least mild spinal canal narrowing of the T4 level, which is also unchanged. There is moderate moderate to severe right-sided neural foraminal stenosis at T9-T10 and T10-T11. IMPRESSION: Incomplete exam due to patient intolerance. Thoracic spine was incompletely imaged and only the sagittal T2 and T1 weighted sequences were acquired. Lack of IV Contrast markedly limits the ability to assess for the presence of epidural spread of tumor. 1. Redemonstrated osseous metastatic disease in the cervical, thoracic, and lumbar spine. No definite evidence of a new lesion. 2. Unchanged severe spinal canal stenosis at the C3-C6 vertebral body levels. 3. Unchanged moderate spinal canal stenosis at T8-T9 secondary to posterior epidural tumor. 4. Unchanged mild spinal canal stenosis at T4. 5. Unchanged compression deformity of the T4 vertebral body. Electronically Signed   By: Marin Roberts M.D.   On: 06/28/2022 15:54   MR THORACIC SPINE WO CONTRAST  Result Date: 06/28/2022 CLINICAL DATA:  Staging.  Known metastatic disease. EXAM: MRI CERVICAL AND THORACIC SPINE WITHOUT CONTRAST TECHNIQUE: Multiplanar and multiecho pulse sequences of the cervical spine, to include the craniocervical junction and cervicothoracic junction, and the thoracic spine, were obtained without intravenous contrast. COMPARISON:  03/05/22 MRI C SPine and MRI T SPine FINDINGS: Limitations: Incomplete exam due to patient intolerance. Patient was unable to tolerate supine positioning. Thoracic spine was incompletely imaged and only the sagittal T2 and T1 weighted sequences were acquired. MRI CERVICAL SPINE FINDINGS Alignment: There is straightening of the normal cervical lordosis. There is mild retrolisthesis of C5 on C6, unchanged from prior exam. Vertebrae: Redemonstrated are T2/stir hyperintense lesions at the C4 and C5 vertebral body levels. There are additional T1 hypointense lesions at the C7, T2, and T4  levels. The lesions were present on prior exam. Redemonstrated is a compression deformity of the T4 level, which difficult to compare, but is likely unchanged compared to prior exam. Cord: No evidence of cord signal abnormality. Posterior Fossa, vertebral arteries, paraspinal tissues: A 9 mm thyroid nodule in the right thyroid gland. Disc levels: Redemonstrated severe spinal canal stenosis at the C6 vertebral body level, likely unchanged from prior exam. MRI THORACIC SPINE FINDINGS Alignment:  Physiologic. Vertebrae: Redemonstrated are T1 hypointense lesions in the T2, T4, T9, T10 L1 vertebral body levels. There is no definite evidence of a new lesion. Cord: Redemonstrated spinal canal stenosis of the T8-T9 levels secondary to a combination of the disc bulge and possible ligamentum flavum hypertrophy. There is likely mild stenosis of the T4 level, which is also unchanged from prior exam. Paraspinal  and other soft tissues: Negative. Disc levels: Axial sequences were not acquired due to patient intolerance. Within this limitation, there is likely moderate to severe spinal canal stenosis at T8-T9 level, unchanged from prior exam. There is likely also at least mild spinal canal narrowing of the T4 level, which is also unchanged. There is moderate moderate to severe right-sided neural foraminal stenosis at T9-T10 and T10-T11. IMPRESSION: Incomplete exam due to patient intolerance. Thoracic spine was incompletely imaged and only the sagittal T2 and T1 weighted sequences were acquired. Lack of IV Contrast markedly limits the ability to assess for the presence of epidural spread of tumor. 1. Redemonstrated osseous metastatic disease in the cervical, thoracic, and lumbar spine. No definite evidence of a new lesion. 2. Unchanged severe spinal canal stenosis at the C3-C6 vertebral body levels. 3. Unchanged moderate spinal canal stenosis at T8-T9 secondary to posterior epidural tumor. 4. Unchanged mild spinal canal stenosis at  T4. 5. Unchanged compression deformity of the T4 vertebral body. Electronically Signed   By: Marin Roberts M.D.   On: 06/28/2022 15:54   CT Abdomen Pelvis W Contrast  Result Date: 06/24/2022 CLINICAL DATA:  Pancreatic cancer * Tracking Code: BO * EXAM: CT ABDOMEN AND PELVIS WITH CONTRAST TECHNIQUE: Multidetector CT imaging of the abdomen and pelvis was performed using the standard protocol following bolus administration of intravenous contrast. RADIATION DOSE REDUCTION: This exam was performed according to the departmental dose-optimization program which includes automated exposure control, adjustment of the mA and/or kV according to patient size and/or use of iterative reconstruction technique. CONTRAST:  167m OMNIPAQUE IOHEXOL 300 MG/ML  SOLN COMPARISON:  MRI abdomen 02/09/2022.  CT 02/08/2022 FINDINGS: Lower chest: Parenchymal opacity identified at the left lower lobe is increased from previous. Atelectasis versus infiltrate. There is some atelectasis at the right lung base as well. No pleural effusion. Sclerosis identified surrounding the anterior aspect of the right fifth and sixth rib at the edge of the imaging field. This is in the area of previous lytic bone lesion and sequela of known osseous metastatic disease. Hepatobiliary: By prior CT and MRI there are multiple liver metastases. These are less apparent today, decreasing in size and number. Example lesion segment 4 today on series 2, image 16 measures 9 x 9 mm and previously 14 by 12 mm. Patent portal vein. Gallbladder is nondilated. Pancreas: Mass along the tail of the pancreas is again identified. Previously measuring 3.3 x 2.4 cm and today on series 2, image 17 2.8 by 1.9 cm. Spleen: Normal in size without focal abnormality. Adrenals/Urinary Tract: New right adrenal nodules are identified worrisome for metastatic lesions. There are 2/lesion seen. The largest is along the inferior aspect of the adrenal gland on series 2, image 23 measuring 2.1 by  1.1 cm. There also 2 left adrenal nodule which are increasing in size. The larger is seen along the inferior aspect as well on series 2, image 22 measuring 16 by 13 mm. No enhancing renal mass or collecting system dilatation. Tiny cystic foci are seen. There is also a new complex lesion along the medial aspect of the left kidney best seen on delay series 7, image 18 measuring 12 by 10 mm. A new malignant lesion is possible. Preserved contours of the urinary bladder. Stomach/Bowel: Large bowel has moderate left-sided colonic stool. Large bowel is of normal course and caliber. Normal appendix. Small bowel is nondilated. 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 Vascular/Lymphatic: Normal caliber aorta and IVC with scattered vascular calcifications. There is an abnormal  appearing node along the celiac axis region on series 2, image 17 measuring 14 x 10 mm today. Previously this node measured 10 by 8 mm. Small para-aortic nodes more caudal are stable. Reproductive: Uterus and bilateral adnexa are unremarkable. Other: Mild anasarca. Musculoskeletal: Diffuse sclerotic bone metastases identified along the spine, ribs, pelvis and femurs. These are significantly progressed from previous examination. IMPRESSION: Mixed response but overall there is significant areas of worsening disease. There are increasing sclerotic bone metastases, increasing bilateral adrenal masses, periceliac lymph node and a possible new left renal metastatic lesion. The pancreatic tail lesion is similar. The hepatic metastases appear slightly decreased. Infiltrate versus rounded atelectasis follow-up and correlation with symptoms. Trace left pleural effusion. Findings will be related to the ordering service by the Radiology physician assistant team when the office opens Electronically Signed   By: Jill Side M.D.   On: 06/24/2022 19:27    Microbiology: Results for orders placed or performed during the hospital encounter of 05/06/22  Culture,  blood (Routine x 2)     Status: None   Collection Time: 05/06/22  4:30 AM   Specimen: BLOOD  Result Value Ref Range Status   Specimen Description   Final    BLOOD RIGHT ANTECUBITAL Performed at [facility] [address], [place], [place] [address]    Special Requests   Final    BOTTLES DRAWN AEROBIC AND ANAEROBIC Blood Culture adequate volume Performed at [facility] [address] [place], [place] [address]    Culture   Final    NO GROWTH 5 DAYS Performed at [facility], [place] [address], [place], [place] [address]    Report Status 05/11/2022 FINAL  Final  Resp panel by RT-PCR (RSV, Flu A&B, Covid) Anterior Nasal Swab     Status: None   Collection Time: 05/06/22  6:48 AM   Specimen: Anterior Nasal Swab  Result Value Ref Range Status   SARS Coronavirus 2 by RT PCR NEGATIVE NEGATIVE Final    Comment: (NOTE) SARS-CoV-2 target nucleic acids are NOT DETECTED.  The SARS-CoV-2 RNA is generally detectable in upper respiratory specimens during the acute phase of infection. The lowest concentration of SARS-CoV-2 viral copies this assay can detect is 138 copies/mL. A negative result does not preclude SARS-Cov-2 infection and should not be used as the sole basis for treatment or other patient management decisions. A negative result may occur with  improper specimen collection/handling, submission of specimen other than nasopharyngeal swab, presence of viral mutation(s) within the areas targeted by this assay, and inadequate number of viral copies(<138 copies/mL). A negative result must be combined with clinical observations, patient history, and epidemiological information. The expected result is Negative.  Fact Sheet for Patients:  EntrepreneurPulse.com.au  Fact Sheet for Healthcare Providers:  IncredibleEmployment.be  This test is no t yet approved or cleared by the [place] FDA and  has been  authorized for detection and/or diagnosis of SARS-CoV-2 by FDA under an Emergency Use Authorization (EUA). This EUA will remain  in effect (meaning this test can be used) for the duration of the COVID-19 declaration under Section 564(b)(1) of the Act, 21 U.S.C.section 360bbb-3(b)(1), unless the authorization is terminated  or revoked sooner.       Influenza A by PCR NEGATIVE NEGATIVE Final   Influenza B by PCR NEGATIVE NEGATIVE Final    Comment: (NOTE) The Xpert Xpress SARS-CoV-2/FLU/RSV plus assay is intended as an aid in the diagnosis of influenza from Nasopharyngeal swab specimens  and should not be used as a sole basis for treatment. Nasal washings and aspirates are unacceptable for Xpert Xpress SARS-CoV-2/FLU/RSV testing.  Fact Sheet for Patients: EntrepreneurPulse.com.au  Fact Sheet for Healthcare Providers: IncredibleEmployment.be  This test is not yet approved or cleared by the [place] FDA and has been authorized for detection and/or diagnosis of SARS-CoV-2 by FDA under an Emergency Use Authorization (EUA). This EUA will remain in effect (meaning this test can be used) for the duration of the COVID-19 declaration under Section 564(b)(1) of the Act, 21 U.S.C. section 360bbb-3(b)(1), unless the authorization is terminated or revoked.     Resp Syncytial Virus by PCR NEGATIVE NEGATIVE Final    Comment: (NOTE) Fact Sheet for Patients: EntrepreneurPulse.com.au  Fact Sheet for Healthcare Providers: IncredibleEmployment.be  This test is not yet approved or cleared by the [place] FDA and has been authorized for detection and/or diagnosis of SARS-CoV-2 by FDA under an Emergency Use Authorization (EUA). This EUA will remain in effect (meaning this test can be used) for the duration of the COVID-19 declaration under Section 564(b)(1) of the Act, 21 U.S.C. section 360bbb-3(b)(1), unless the  authorization is terminated or revoked.  Performed at [facility], [place] [address], [place], [place] [address]   Culture, blood (Routine x 2)     Status: None   Collection Time: 05/07/22 12:05 PM   Specimen: BLOOD  Result Value Ref Range Status   Specimen Description   Final    BLOOD PORTA CATH Performed at [facility] [address], [place], [place] [address]    Special Requests   Final    BOTTLES DRAWN AEROBIC AND ANAEROBIC Blood Culture adequate volume Performed at [facility] [address], [place], [place] [address]    Culture   Final    NO GROWTH 5 DAYS Performed at [facility], [place] [address] [place], [place] [address]    Report Status 05/12/2022 FINAL  Final    Labs: CBC: Recent Labs  Lab 06/28/22 0830 06/29/22 0710  WBC 7.2 6.5  NEUTROABS 5.4 4.5  HGB 10.0* 9.9*  HCT 31.2* 31.2*  MCV 101.0* 102.6*  PLT 214 [id number]   Basic Metabolic Panel: Recent Labs  Lab 06/25/22 1553 06/28/22 0830 06/29/22 0710  NA 142 136 134*  K 4.3 3.3* 3.5  CL 102 102 98  CO2 '29 27 25  '$ GLUCOSE 100* 116* 101*  BUN '12 10 7  '$ CREATININE 0.53 0.41* 0.39*  CALCIUM 10.6* 9.1 8.8*  MG  --   --  1.8   Liver Function Tests: Recent Labs  Lab 06/29/22 0710  AST 34  ALT 24  ALKPHOS 200*  BILITOT 1.0  PROT 6.8  ALBUMIN 3.6   CBG: No results for input(s): "GLUCAP" in the last 168 hours.  Discharge time spent: approximately 35 minutes spent on discharge counseling, evaluation of patient on day of discharge, and coordination of discharge planning with nursing, social work, pharmacy and case management  Signed: Edwin Dada, MD [facility] 07/01/2022

## 2022-07-02 ENCOUNTER — Telehealth: Payer: Self-pay | Admitting: *Deleted

## 2022-07-02 ENCOUNTER — Encounter: Payer: Self-pay | Admitting: *Deleted

## 2022-07-02 DIAGNOSIS — C7951 Secondary malignant neoplasm of bone: Secondary | ICD-10-CM | POA: Diagnosis not present

## 2022-07-02 DIAGNOSIS — C259 Malignant neoplasm of pancreas, unspecified: Secondary | ICD-10-CM | POA: Diagnosis not present

## 2022-07-02 NOTE — Transitions of Care (Post Inpatient/ED Visit) (Signed)
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 Tammi Klippel back- when I spoke them earlier today, they told me my appointment was scheduled for tomorrow-- I am not sure what that Wednesday 07/04/22 appointment you are telling me about is for") Any questions or concerns?: Yes Patient Questions/Concerns:: Questions about scheduling of post-hospital discharge specialist appointment Patient Questions/Concerns Addressed: Other: (confirmed patient has contact information for radiation oncology provider team- she verbalizes proactive plans to contact them for clarification about her scheduled follow up appointment and this was encouraged)  Items Reviewed: Did you receive and understand the discharge instructions provided?: Yes (thoroughly reviewed with patient who verbalizes excellent understanding of same) Medications obtained and verified?: Yes (Medications Reviewed) (full medication review completed; no concerns or discrepancies identified; confirmed patient obtained/ is taking all newly Rx'd medications as instructed; self-manages medications and denies questions/ concerns around medications today) Any new allergies since your discharge?: No Dietary orders reviewed?: Yes Type of Diet Ordered:: Regular Do you have support at home?: Yes People in Home: spouse Name of Support/Comfort Primary Source: patient reports she is independent in self-care activities: reports  family members assist as/ if indicated/ needed  Home Care and Equipment/Supplies: [place] Ordered?: No Any new equipment or medical supplies ordered?: No  Functional Questionnaire: Do you need assistance with bathing/showering or dressing?: No Do you need assistance with meal preparation?: No Do you need assistance with eating?: No Do you have difficulty maintaining continence: No Do you need assistance with getting out of bed/getting out of a chair/moving?: No Do you have difficulty managing or taking your medications?: No  Folllow up appointments reviewed: PCP Follow-up appointment confirmed?: NA (verified not recommended per hospital discharge notes: specialist provider appointment only recommended) [facility] Follow-up appointment confirmed?: Yes Date of Specialist follow-up appointment?: 07/04/22 Follow-Up Specialty Provider:: Dr. Tammi Klippel Radiation oncologist Do you need transportation to your follow-up appointment?: No Do you understand care options if your condition(s) worsen?: Yes-patient verbalized understanding  SDOH Interventions Today    Flowsheet Row Most Recent Value  SDOH Interventions   Food Insecurity Interventions Intervention Not Indicated  Transportation Interventions Intervention Not Indicated  [brother and sister provide transportation]      TOC Interventions Today    Flowsheet Row Most Recent Value  TOC Interventions   TOC Interventions Discussed/Reviewed TOC Interventions Discussed  [provided my direct contact information should questions/ concerns/ needs arise post-TOC call]      Interventions Today    Flowsheet Row Most Recent Value  Chronic Disease   Chronic disease during today's visit Other  [cancer with mets]  General Interventions   General Interventions Discussed/Reviewed General Interventions Discussed, Doctor Visits  Doctor Visits Discussed/Reviewed Doctor Visits Discussed, Specialist  PCP/Specialist Visits  Compliance with follow-up visit  Nutrition Interventions   Nutrition Discussed/Reviewed Nutrition Discussed  Pharmacy Interventions   Pharmacy Dicussed/Reviewed Pharmacy Topics Discussed  [full medication review]      Oneta Rack, RN, BSN, CCRN Alumnus RN CM Care Coordination/ Transition of [place] Management [phone])  [phone]: direct office

## 2022-07-03 ENCOUNTER — Encounter: Payer: Self-pay | Admitting: *Deleted

## 2022-07-03 LAB — CANCER ANTIGEN 19-9: CA 19-9: 52 U/mL — ABNORMAL HIGH (ref 0–35)

## 2022-07-03 NOTE — Progress Notes (Unsigned)
Patient has been discharged from the hospital. She is scheduled to see RadOnc for simulation and treatment to her worsening spine mets. Dr [name] would like to see her back in the office after radiation is done. Message sent to scheduling.   Oncology Nurse Navigator Documentation     07/03/2022    8:30 AM  Oncology Nurse Navigator Flowsheets  Navigator Follow Up Date: 07/18/2022  Navigator Follow Up Reason: Follow-up Appointment  Navigator Location [facility]  Navigator Encounter Type Appt/Treatment Plan Review  Patient Visit Type MedOnc  Treatment Phase Active Tx  Barriers/Navigation Needs Coordination of Care;Education  Interventions Coordination of Care  Acuity Level 2-Minimal Needs (1-2 Barriers Identified)  Coordination of Care Appts  Support Groups/Services Friends and Family  Time Spent with Patient 15

## 2022-07-04 ENCOUNTER — Encounter: Payer: Self-pay | Admitting: Hematology & Oncology

## 2022-07-04 ENCOUNTER — Other Ambulatory Visit: Payer: Self-pay | Admitting: *Deleted

## 2022-07-04 ENCOUNTER — Ambulatory Visit
Admission: RE | Admit: 2022-07-04 | Discharge: 2022-07-04 | Disposition: A | Discharge status: Home / Self Care | Payer: BC Managed Care – PPO | Source: Ambulatory Visit | Attending: Radiation Oncology | Admitting: Radiation Oncology

## 2022-07-04 DIAGNOSIS — C259 Malignant neoplasm of pancreas, unspecified: Secondary | ICD-10-CM | POA: Diagnosis not present

## 2022-07-04 DIAGNOSIS — C7951 Secondary malignant neoplasm of bone: Secondary | ICD-10-CM | POA: Diagnosis not present

## 2022-07-04 DIAGNOSIS — C252 Malignant neoplasm of tail of pancreas: Secondary | ICD-10-CM

## 2022-07-04 MED ORDER — HYDROMORPHONE HCL 2 MG PO TABS: 2.0000 mg | ORAL_TABLET | ORAL | 0 refills | Status: DC | PRN

## 2022-07-04 NOTE — Progress Notes (Signed)
Radiation Oncology         [phone] ________________________________  Initial Outpatient Consultation  Name: Makayla Good MRN: [id number]  Date of Service: 07/04/2022 DOB: 12/21/1970  [id number] Chase, Alferd Apa, DO  Carollee Herter, Melbeta, *   REFERRING PHYSICIAN: [name], *  DIAGNOSIS: The encounter diagnosis was Metastatic cancer to spine [facility]).    ICD-10-CM   1. Metastatic cancer to spine [facility])  C79.51       HISTORY OF PRESENT ILLNESS: Makayla Good is a 52 y.o. 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 Carollee Herter. This patient is well known to [place] as we have treated her previously for spine metastasis. She is currently receiving chemotherapy under the care of Dr. [name]. She completed her 5th cycle of FOLFIRINOX on 06/27/22.   She originally presented to the ED on 06/28/22 for leg pain. MRI of the spine on 06/28/22 redemonstrated osseous metastatic disease in there cervical, thoracic, and lumbar spine. MRI was suboptimal due to patient movement. Patient was admitted for pain control and was seen by medical oncologist, Dr. [name]. After further pain control, patient was sent for another MRI.   Subsequent MRI on 06/30/22 again showed multiple osseous lesions in the lumbar spine, sacrum, and bilateral iliac bones, consistent with metastatic disease. Specifically, it showed an enhancing mass involving the right aspect of S1 and S2 compressing the exiting right S1 nerve. No cord compression was noted.   Patient was discharged on 07/01/22 with fentanyl patch 50 mcg, gabapentin 300 TID, PO Dilaudid 2 mg, and Decadron 4 mg TID. She had improvement in her pain and was able to walk and transfer independently. She had no leg weakness and minimal numbness in her leg. Her case was discussed at our multidisciplinary tumor board on 07/02/22. It was agreed that she could benefit from palliative radiation therapy to the sacral masses  causing nerve impingement.   She was kindly referred to discuss radiation treatment options. Today, she complains of pain in her right hip, groin, and right lower back. This pain radiates down the back of her leg.   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   Polycystic ovary    Left   Sleep apnea    no CPAP per MD      PAST SURGICAL HISTORY: Past Surgical History:  Procedure Laterality Date   BACK SURGERY     72yr ago   BREAST BIOPSY Right 2019   fibroadenoma   CESAREAN SECTION  02/01/2005   CESAREAN SECTION  2001   IR IMAGING GUIDED PORT INSERTION  03/07/2022   LAPAROSCOPIC GASTRIC SLEEVE RESECTION N/A 07/29/2017   Procedure: LAPAROSCOPIC GASTRIC SLEEVE RESECTION WITH UPPER ENDO ;  Surgeon: CClovis Riley MD;  Location: [facility];  Service: General;  Laterality: N/A;   OOPHORECTOMY     Rt.removed   TUBAL LIGATION  02/01/2005  FAMILY HISTORY:  Family History  Problem Relation Age of Onset   Hypertension Mother    Schizophrenia Mother    Dementia Mother    Mental illness Mother        schizophrenia, dementia   Hypertension Father    Coronary artery disease Father        Stent   Alzheimer's disease Father    Hypertension Sister    Hypertension Sister    Arthritis Sister        rheumatoid   Dementia Maternal Aunt    Kidney disease Other    Colon cancer Neg Hx    Stomach cancer Neg Hx    Pancreatic cancer Neg Hx    Esophageal cancer Neg Hx    Rectal cancer Neg  Hx    Breast cancer Neg Hx     SOCIAL HISTORY:  Social History   Socioeconomic History   Marital status: Married    Spouse name: Not on file   Number of children: Not on file   Years of education: 28   Highest education level: Not on file  Occupational History   Occupation: release liens    Employer: [employer]  Tobacco Use   Smoking status: Former    Packs/day: 0.50    Years: 30.00    Total pack years: 15.00    Types: Cigarettes, E-cigarettes    Start date: 04/28/2020    Quit date: 2022    Years since quitting: 2.1   Smokeless tobacco: Never   Tobacco comments:    VAPES  Vaping Use   Vaping Use: Former   Substances: Nicotine  Substance and Sexual Activity   Alcohol use: Yes    Comment: occasional   Drug use: No   Sexual activity: Not Currently    Partners: Male    Birth control/protection: Surgical    Comment: Tubal/been with same person over 20 years  Other Topics Concern   Not on file  Social History Narrative   Exercise--gym, every other day--- at least 3 days a week   Social Determinants of Health   Financial Resource Strain: Low Risk  (02/26/2022)   Overall Financial Resource Strain (CARDIA)    Difficulty of Paying Living Expenses: Not hard at all  Food Insecurity: No Food Insecurity (07/02/2022)   Hunger Vital Sign    Worried About Running Out of Food in the Last Year: Never true    Ran Out of Food in the Last Year: Never true  Transportation Needs: No Transportation Needs (07/02/2022)   PRAPARE - Hydrologist (Medical): No    Lack of Transportation (Non-Medical): No  Physical Activity: Not on file  Stress: Not on file  Social Connections: Not on file  Intimate Partner Violence: Not At Risk (06/29/2022)   Humiliation, Afraid, Rape, and Kick questionnaire    Fear of Current or Ex-Partner: No    Emotionally Abused: No    Physically Abused: No    Sexually Abused: No    ALLERGIES: Patient has no known  allergies.  MEDICATIONS:  Current Outpatient Medications  Medication Sig Dispense Refill   acetaminophen (TYLENOL) 500 MG tablet Take 1 tablet (500 mg total) by mouth every 6 (six) hours as needed for mild pain (or Fever >/= 101).     cyclobenzaprine (FLEXERIL) 10 MG tablet TAKE 1 TABLET BY MOUTH AT BEDTIME AS NEEDED FOR MUSCLE SPASMS (Patient taking differently: Take 10 mg by mouth at bedtime as needed for muscle spasms.) 30  tablet 0   dexamethasone (DECADRON) 4 MG tablet Take 1 tablet (4 mg total) by mouth 3 (three) times daily. 30 tablet 1   Ensure Max Protein (ENSURE MAX PROTEIN) LIQD Take 330 mLs (11 oz total) by mouth daily.     escitalopram (LEXAPRO) 10 MG tablet TAKE 1 TABLET BY MOUTH EVERY DAY 90 tablet 1   fentaNYL (DURAGESIC) 50 MCG/HR Place 1 patch onto the skin every 3 (three) days. 10 patch 0   fluticasone (FLONASE) 50 MCG/ACT nasal spray SPRAY 2 SPRAYS INTO EACH NOSTRIL EVERY DAY (Patient taking differently: Place 1 spray into both nostrils daily as needed for allergies.) 48 mL 2   gabapentin (NEURONTIN) 300 MG capsule Take 1 capsule (300 mg total) by mouth 3 (three) times daily. 90 capsule 1   hydrochlorothiazide (HYDRODIURIL) 25 MG tablet Take 1 tablet (25 mg total) by mouth daily. 90 tablet 3   HYDROmorphone (DILAUDID) 2 MG tablet Take 1 tablet (2 mg total) by mouth every 4 (four) hours as needed for severe pain. 12 tablet 0   Ipratropium-Albuterol (COMBIVENT) 20-100 MCG/ACT AERS respimat Inhale 1 puff into the lungs 3 (three) times daily for 5 days, THEN 1 puff every 6 (six) hours as needed for wheezing. (Patient not taking: Reported on 06/29/2022) 1 each 1   lidocaine-prilocaine (EMLA) cream Apply to affected area once 30 g 3   loperamide (IMODIUM) 2 MG capsule Take 2 tabs by mouth with first loose stool, then 1 tab with each additional loose stool as needed. Do not exceed 8 tabs in a 24-hour period (Patient taking differently: Take 2 mg by mouth daily as needed for diarrhea or  loose stools.) 60 capsule 5   LORazepam (ATIVAN) 0.5 MG tablet Take 1 tablet (0.5 mg total) by mouth every 8 (eight) hours as needed for anxiety. 30 tablet 0   omeprazole (PRILOSEC) 40 MG capsule Take 1 capsule (40 mg total) by mouth daily. 90 capsule 3   ondansetron (ZOFRAN) 8 MG tablet Take 1 tablet (8 mg total) by mouth every 8 (eight) hours as needed for nausea or vomiting. Start on the third day after cisplatin 30 tablet 1   potassium chloride SA (KLOR-CON M20) 20 MEQ tablet TAKE 2 TABLETS BY MOUTH DAILY (Patient taking differently: Take 20 mEq by mouth daily.) 180 tablet 0   prochlorperazine (COMPAZINE) 10 MG tablet Take 1 tablet (10 mg total) by mouth every 6 (six) hours as needed for nausea or vomiting (Nausea or vomiting). 30 tablet 1   temazepam (RESTORIL) 15 MG capsule Take 1 capsule (15 mg total) by mouth at bedtime as needed for sleep. 30 capsule 0   No current facility-administered medications for this encounter.   Facility-Administered Medications Ordered in Other Encounters  Medication Dose Route Frequency Provider Last Rate Last Admin   sodium chloride flush (NS) 0.9 % injection 10 mL  10 mL Intravenous PRN Volanda Napoleon, MD   10 mL at 03/26/22 0927    REVIEW OF SYSTEMS:  As per HPI.     PHYSICAL EXAM:  Wt Readings from Last 3 Encounters:  06/30/22 164 lb 11.2 oz (74.7 kg)  06/25/22 171 lb (77.6 kg)  06/06/22 166 lb 0.6 oz (75.3 kg)   Temp Readings from Last 3 Encounters:  07/01/22 98.9 F (37.2 C) (Oral)  06/25/22 98.3 F (36.8 C) (Oral)  06/22/22 99.2 F (37.3 C) (Oral)   BP Readings from Last 3 Encounters:  07/01/22 (!) 140/85  06/25/22 (!) 124/90  06/22/22 Marland Kitchen)  154/101   Pulse Readings from Last 3 Encounters:  07/01/22 92  06/25/22 (!) 125  06/22/22 (!) 109   In general this is a well appearing female in no acute distress. She's alert and oriented x4 and appropriate throughout the examination. Cardiopulmonary assessment is negative for acute distress  and she exhibits normal effort. Patient experiences exquisite tenderness to palpation of the right side of her sacrum.     KPS = 70  100 - Normal; no complaints; no evidence of disease. 90   - Able to carry on normal activity; minor signs or symptoms of disease. 80   - Normal activity with effort; some signs or symptoms of disease. 7   - Cares for self; unable to carry on normal activity or to do active work. 60   - Requires occasional assistance, but is able to care for most of his personal needs. 50   - Requires considerable assistance and frequent medical care. 40   - Disabled; requires special care and assistance. 62   - Severely disabled; hospital admission is indicated although death not imminent. 13   - Very sick; hospital admission necessary; active supportive treatment necessary. 10   - Moribund; fatal processes progressing rapidly. 0     - Dead  Karnofsky DA, Abelmann Duck, Craver LS and Burchenal [facility] [phone]) The use of the nitrogen mustards in the palliative treatment of carcinoma: with particular reference to bronchogenic carcinoma Cancer 1 [id number]  LABORATORY DATA:  Lab Results  Component Value Date   WBC 6.5 06/29/2022   HGB 9.9 (L) 06/29/2022   HCT 31.2 (L) 06/29/2022   MCV 102.6 (H) 06/29/2022   PLT 216 06/29/2022   Lab Results  Component Value Date   NA 134 (L) 06/29/2022   K 3.5 06/29/2022   CL 98 06/29/2022   CO2 25 06/29/2022   Lab Results  Component Value Date   ALT 24 06/29/2022   AST 34 06/29/2022   ALKPHOS 200 (H) 06/29/2022   BILITOT 1.0 06/29/2022     RADIOGRAPHY: MR Lumbar Spine W Wo Contrast  Result Date: 06/30/2022 CLINICAL DATA:  Low back pain, metastatic disease suspected EXAM: MRI LUMBAR SPINE WITHOUT AND WITH CONTRAST TECHNIQUE: Multiplanar and multiecho pulse sequences of the lumbar spine were obtained without and with intravenous contrast. CONTRAST:  7.80m GADAVIST GADOBUTROL 1 MMOL/ML IV SOLN COMPARISON:  06/28/2022 FINDINGS: Segmentation:  5 lumbar type vertebral bodies. The lowest fully formed disc space is labeled L5-S1. Alignment: Mild levocurvature. Straightening of the normal lumbar lordosis. 3 mm retrolisthesis of L5 on S1. Vertebrae: T1 hypointense, T2 hyperintense, and contrast enhancing lesions are seen in L1, L3, L4, L5, in the sacrum, and in the bilateral iliac bones. Metastatic narrowing of the right neural foramen is noted at L1 (series 12, image 12 and series 13, image 5). Metastatic extension into the spinal canal is noted at L3 (series 12, image 21). Metastatic extension into the spinal canal and right neural foramina is seen in the right sacrum (series 12, image 36 and 38; series 13, image 2), where a large mass involving the right aspect of S1 and S2 encases and compresses the exiting right S1 nerve, which appears abnormally enhanced, and narrows the sacral spinal canal. This mass measures approximately 4.3 x 5.9 x 5.6 cm (AP x TR x CC) (series 13, image 2 and series 12, image 37). Conus medullaris and cauda equina: Conus extends to the L2 level. As described above, there is abnormal enhancement in the exiting right  S1 nerve (series 12, image 36-40). Conus and cauda equina otherwise appear normal. Paraspinal and other soft tissues: Several T2 hypointense but nonenhancing lesions are noted in the left kidney, which are better evaluated on the recent CT abdomen pelvis. Disc levels: T12-L1: No significant disc bulge. No spinal canal stenosis or neural foraminal narrowing. L1-L2: No significant disc bulge. No spinal canal stenosis. Moderate right neural foraminal narrowing. L2-L3: Minimal disc bulge with left greater than right foraminal protrusion. Mild facet arthropathy. Mild spinal canal stenosis. Narrowing of the lateral recesses. No neural foraminal narrowing. L3-L4: No significant disc bulge. Metastatic disease at the posteroinferior aspect of L3 contributes to moderate to severe spinal canal stenosis. Effacement of the lateral  recesses. Mild left neural foraminal narrowing. L4-L5: Disc height loss and moderate disc bulge. Mild facet arthropathy. Ligamentum flavum hypertrophy. Moderate to severe spinal canal stenosis. Effacement of the lateral recesses. No neural foraminal narrowing. L5-S1: Trace retrolisthesis. Disc height loss and disc osteophyte complex. No spinal canal stenosis at the disc level. Posterior to L1, there is encroachment by the aforementioned mass, which does not cause significant spinal canal stenosis, but which effaces the right lateral recesses. Moderate bilateral neural foraminal narrowing. IMPRESSION: 1. Multiple osseous lesions in the lumbar spine, sacrum, and bilateral iliac bones, consistent with metastatic disease. 2. Enhancing mass at the posteroinferior aspect of L3 contributes to moderate to severe spinal canal stenosis at L3-L4 and effacement of the lateral recesses, which could affect the descending L4 nerve roots. There is also mild left neural foraminal narrowing at L3-L4. 3. Enhancing mass involving the right aspect of S1 and S2, which encases and compresses the exiting right S1 nerve, which appears abnormally enhanced. The mass narrows the sacral spinal canal without significant spinal canal stenosis. 4. L4-L5 moderate to severe spinal canal stenosis. Effacement of the lateral recesses at this level likely compresses the descending L5 nerve roots. 5. L5-S1 moderate bilateral neural foraminal narrowing. 6. L1-L2 moderate right neural foraminal narrowing. 7. L2-L3 mild spinal canal stenosis. These results will be called to the ordering clinician or representative by the Radiologist Assistant, and communication documented in the PACS or [employer]. Electronically Signed   By: Merilyn Baba M.D.   On: 06/30/2022 19:59   MR LUMBAR SPINE WO CONTRAST  Result Date: 06/29/2022 CLINICAL DATA:  Back and leg pain, metastatic disease, staging EXAM: MRI LUMBAR SPINE WITHOUT CONTRAST TECHNIQUE: Multiplanar,  multisequence MR imaging of the lumbar spine was performed. No intravenous contrast was administered. COMPARISON:  03/05/2022 MRI lumbar spine, 06/28/2022 MRI thoracic spine, 06/22/2022 CT abdomen pelvis FINDINGS: Evaluation is significantly limited by motion artifact. The only mildly usable sequence is the sagittal T2. No axial sequences were obtained. No contrast could be given. Segmentation: 5 lumbar type vertebral bodies. The lowest fully formed disc space is labeled L5-S1. Alignment: Minimal levocurvature of the lumbar spine. Straightening of the normal lumbar lordosis. No significant listhesis. Vertebrae: Redemonstrated abnormal signal throughout the lumbar spine and imaged sacrum, largely sparing the L2 vertebral body. The amount of bulky tumor at the inferior posterior aspect of L3 has increased since the prior exam, with extension into the ventral epidural space. Overall stable vertebral body heights. Conus medullaris and cauda equina: Conus extends to the L1 level. Conus and cauda equina demonstrate normal signal 4 T2 weighted imaging. Paraspinal and other soft tissues: Evaluation for lymphadenopathy is limited by the sequences obtained. Disc levels: Unable to fully evaluate. Moderate to severe spinal canal stenosis is likely at L3-L4 and L4-L5,  similar to prior IMPRESSION: 1. Evaluation is significantly limited by motion artifact. The only mildly usable sequence is the sagittal T2 sequence. No axial sequences were obtained. No contrast could be given. Recommend repeating this MRI when the patient is better able to tolerate the exam. 2. Redemonstrated diffuse osseous metastatic disease throughout the lumbar spine and imaged sacrum. The amount of bulky tumor at the inferior posterior aspect of L3 has increased since the prior MRI, with redemonstrated extension into the ventral epidural space. 3. Moderate to severe spinal canal stenosis is likely at L3-L4 and L4-L5, similar to prior, although incompletely  evaluated without axial sequences. Electronically Signed   By: Merilyn Baba M.D.   On: 06/29/2022 00:09   MR CERVICAL SPINE WO CONTRAST  Result Date: 06/28/2022 CLINICAL DATA:  Staging.  Known metastatic disease. EXAM: MRI CERVICAL AND THORACIC SPINE WITHOUT CONTRAST TECHNIQUE: Multiplanar and multiecho pulse sequences of the cervical spine, to include the craniocervical junction and cervicothoracic junction, and the thoracic spine, were obtained without intravenous contrast. COMPARISON:  03/05/22 MRI C SPine and MRI T SPine FINDINGS: Limitations: Incomplete exam due to patient intolerance. Patient was unable to tolerate supine positioning. Thoracic spine was incompletely imaged and only the sagittal T2 and T1 weighted sequences were acquired. MRI CERVICAL SPINE FINDINGS Alignment: There is straightening of the normal cervical lordosis. There is mild retrolisthesis of C5 on C6, unchanged from prior exam. Vertebrae: Redemonstrated are T2/stir hyperintense lesions at the C4 and C5 vertebral body levels. There are additional T1 hypointense lesions at the C7, T2, and T4 levels. The lesions were present on prior exam. Redemonstrated is a compression deformity of the T4 level, which difficult to compare, but is likely unchanged compared to prior exam. Cord: No evidence of cord signal abnormality. Posterior Fossa, vertebral arteries, paraspinal tissues: A 9 mm thyroid nodule in the right thyroid gland. Disc levels: Redemonstrated severe spinal canal stenosis at the C6 vertebral body level, likely unchanged from prior exam. MRI THORACIC SPINE FINDINGS Alignment:  Physiologic. Vertebrae: Redemonstrated are T1 hypointense lesions in the T2, T4, T9, T10 L1 vertebral body levels. There is no definite evidence of a new lesion. Cord: Redemonstrated spinal canal stenosis of the T8-T9 levels secondary to a combination of the disc bulge and possible ligamentum flavum hypertrophy. There is likely mild stenosis of the T4 level,  which is also unchanged from prior exam. Paraspinal and other soft tissues: Negative. Disc levels: Axial sequences were not acquired due to patient intolerance. Within this limitation, there is likely moderate to severe spinal canal stenosis at T8-T9 level, unchanged from prior exam. There is likely also at least mild spinal canal narrowing of the T4 level, which is also unchanged. There is moderate moderate to severe right-sided neural foraminal stenosis at T9-T10 and T10-T11. IMPRESSION: Incomplete exam due to patient intolerance. Thoracic spine was incompletely imaged and only the sagittal T2 and T1 weighted sequences were acquired. Lack of IV Contrast markedly limits the ability to assess for the presence of epidural spread of tumor. 1. Redemonstrated osseous metastatic disease in the cervical, thoracic, and lumbar spine. No definite evidence of a new lesion. 2. Unchanged severe spinal canal stenosis at the C3-C6 vertebral body levels. 3. Unchanged moderate spinal canal stenosis at T8-T9 secondary to posterior epidural tumor. 4. Unchanged mild spinal canal stenosis at T4. 5. Unchanged compression deformity of the T4 vertebral body. Electronically Signed   By: Marin Roberts M.D.   On: 06/28/2022 15:54   MR THORACIC SPINE WO CONTRAST  Result Date: 06/28/2022 CLINICAL DATA:  Staging.  Known metastatic disease. EXAM: MRI CERVICAL AND THORACIC SPINE WITHOUT CONTRAST TECHNIQUE: Multiplanar and multiecho pulse sequences of the cervical spine, to include the craniocervical junction and cervicothoracic junction, and the thoracic spine, were obtained without intravenous contrast. COMPARISON:  03/05/22 MRI C SPine and MRI T SPine FINDINGS: Limitations: Incomplete exam due to patient intolerance. Patient was unable to tolerate supine positioning. Thoracic spine was incompletely imaged and only the sagittal T2 and T1 weighted sequences were acquired. MRI CERVICAL SPINE FINDINGS Alignment: There is straightening of the  normal cervical lordosis. There is mild retrolisthesis of C5 on C6, unchanged from prior exam. Vertebrae: Redemonstrated are T2/stir hyperintense lesions at the C4 and C5 vertebral body levels. There are additional T1 hypointense lesions at the C7, T2, and T4 levels. The lesions were present on prior exam. Redemonstrated is a compression deformity of the T4 level, which difficult to compare, but is likely unchanged compared to prior exam. Cord: No evidence of cord signal abnormality. Posterior Fossa, vertebral arteries, paraspinal tissues: A 9 mm thyroid nodule in the right thyroid gland. Disc levels: Redemonstrated severe spinal canal stenosis at the C6 vertebral body level, likely unchanged from prior exam. MRI THORACIC SPINE FINDINGS Alignment:  Physiologic. Vertebrae: Redemonstrated are T1 hypointense lesions in the T2, T4, T9, T10 L1 vertebral body levels. There is no definite evidence of a new lesion. Cord: Redemonstrated spinal canal stenosis of the T8-T9 levels secondary to a combination of the disc bulge and possible ligamentum flavum hypertrophy. There is likely mild stenosis of the T4 level, which is also unchanged from prior exam. Paraspinal and other soft tissues: Negative. Disc levels: Axial sequences were not acquired due to patient intolerance. Within this limitation, there is likely moderate to severe spinal canal stenosis at T8-T9 level, unchanged from prior exam. There is likely also at least mild spinal canal narrowing of the T4 level, which is also unchanged. There is moderate moderate to severe right-sided neural foraminal stenosis at T9-T10 and T10-T11. IMPRESSION: Incomplete exam due to patient intolerance. Thoracic spine was incompletely imaged and only the sagittal T2 and T1 weighted sequences were acquired. Lack of IV Contrast markedly limits the ability to assess for the presence of epidural spread of tumor. 1. Redemonstrated osseous metastatic disease in the cervical, thoracic, and  lumbar spine. No definite evidence of a new lesion. 2. Unchanged severe spinal canal stenosis at the C3-C6 vertebral body levels. 3. Unchanged moderate spinal canal stenosis at T8-T9 secondary to posterior epidural tumor. 4. Unchanged mild spinal canal stenosis at T4. 5. Unchanged compression deformity of the T4 vertebral body. Electronically Signed   By: Marin Roberts M.D.   On: 06/28/2022 15:54   CT Abdomen Pelvis W Contrast  Result Date: 06/24/2022 CLINICAL DATA:  Pancreatic cancer * Tracking Code: BO * EXAM: CT ABDOMEN AND PELVIS WITH CONTRAST TECHNIQUE: Multidetector CT imaging of the abdomen and pelvis was performed using the standard protocol following bolus administration of intravenous contrast. RADIATION DOSE REDUCTION: This exam was performed according to the departmental dose-optimization program which includes automated exposure control, adjustment of the mA and/or kV according to patient size and/or use of iterative reconstruction technique. CONTRAST:  144m OMNIPAQUE IOHEXOL 300 MG/ML  SOLN COMPARISON:  MRI abdomen 02/09/2022.  CT 02/08/2022 FINDINGS: Lower chest: Parenchymal opacity identified at the left lower lobe is increased from previous. Atelectasis versus infiltrate. There is some atelectasis at the right lung base as well. No pleural effusion. Sclerosis identified surrounding the  anterior aspect of the right fifth and sixth rib at the edge of the imaging field. This is in the area of previous lytic bone lesion and sequela of known osseous metastatic disease. Hepatobiliary: By prior CT and MRI there are multiple liver metastases. These are less apparent today, decreasing in size and number. Example lesion segment 4 today on series 2, image 16 measures 9 x 9 mm and previously 14 by 12 mm. Patent portal vein. Gallbladder is nondilated. Pancreas: Mass along the tail of the pancreas is again identified. Previously measuring 3.3 x 2.4 cm and today on series 2, image 17 2.8 by 1.9 cm. Spleen:  Normal in size without focal abnormality. Adrenals/Urinary Tract: New right adrenal nodules are identified worrisome for metastatic lesions. There are 2/lesion seen. The largest is along the inferior aspect of the adrenal gland on series 2, image 23 measuring 2.1 by 1.1 cm. There also 2 left adrenal nodule which are increasing in size. The larger is seen along the inferior aspect as well on series 2, image 22 measuring 16 by 13 mm. No enhancing renal mass or collecting system dilatation. Tiny cystic foci are seen. There is also a new complex lesion along the medial aspect of the left kidney best seen on delay series 7, image 18 measuring 12 by 10 mm. A new malignant lesion is possible. Preserved contours of the urinary bladder. Stomach/Bowel: Large bowel has moderate left-sided colonic stool. Large bowel is of normal course and caliber. Normal appendix. Small bowel is nondilated. 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 Vascular/Lymphatic: Normal caliber aorta and IVC with scattered vascular calcifications. There is an abnormal appearing node along the celiac axis region on series 2, image 17 measuring 14 x 10 mm today. Previously this node measured 10 by 8 mm. Small para-aortic nodes more caudal are stable. Reproductive: Uterus and bilateral adnexa are unremarkable. Other: Mild anasarca. Musculoskeletal: Diffuse sclerotic bone metastases identified along the spine, ribs, pelvis and femurs. These are significantly progressed from previous examination. IMPRESSION: Mixed response but overall there is significant areas of worsening disease. There are increasing sclerotic bone metastases, increasing bilateral adrenal masses, periceliac lymph node and a possible new left renal metastatic lesion. The pancreatic tail lesion is similar. The hepatic metastases appear slightly decreased. Infiltrate versus rounded atelectasis follow-up and correlation with symptoms. Trace left pleural effusion. Findings will be related  to the ordering service by the Radiology physician assistant team when the office opens Electronically Signed   By: Jill Side M.D.   On: 06/24/2022 19:27      IMPRESSION/PLAN: 1. 52 y.o. 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   Today we discussed the workup and natural course of metastatic pancreatic cancer to the spine, highlighting the role of radiotherapy in the management. Most recent imaging indicates progression of spinal metastases with several lumbosacral masses causing nerve impingement but no cord compression. Patient is experiencing worsening back/hip/groin pain due to progression of disease. She is a good candidate for palliative radiotherapy to decrease her symptoms and decrease risk of further disease spread. Accordingly, I recommend 20 Gy in 5 fractions to the sacrum. We discussed the available radiation techniques, and focused on the details and logistics of delivery. We discussed and outlined the risks, benefits, short and long-term effects associated with radiotherapy. She was encouraged to ask questions that were answered to his stated satisfaction.   A consent form was signed today and placed in her chart. Patient will undergo CT simulation  today. She has a trip planned for this week and would like to wait to begin treatment until after her trip. Her first treatment is scheduled for 07/09/22.  I personally spent 10 minutes in this encounter including chart review, reviewing radiological studies, meeting face-to-face with the patient, entering orders and completing documentation.    Leona Singleton, PA-C    Tyler Pita, MD  [facility] Direct Dial: [phone]  Fax: [phone] [email or web address]  Skype  LinkedIn

## 2022-07-05 ENCOUNTER — Other Ambulatory Visit: Payer: Self-pay | Admitting: *Deleted

## 2022-07-05 ENCOUNTER — Other Ambulatory Visit (HOSPITAL_BASED_OUTPATIENT_CLINIC_OR_DEPARTMENT_OTHER): Payer: Self-pay

## 2022-07-05 DIAGNOSIS — C252 Malignant neoplasm of tail of pancreas: Secondary | ICD-10-CM

## 2022-07-05 DIAGNOSIS — C7951 Secondary malignant neoplasm of bone: Secondary | ICD-10-CM | POA: Diagnosis not present

## 2022-07-05 DIAGNOSIS — C259 Malignant neoplasm of pancreas, unspecified: Secondary | ICD-10-CM | POA: Diagnosis not present

## 2022-07-05 MED ORDER — HYDROMORPHONE HCL 2 MG PO TABS
2.0000 mg | ORAL_TABLET | ORAL | 0 refills | Status: DC | PRN
  Filled 2022-07-05: qty 90, 15d supply, fill #0

## 2022-07-05 NOTE — Progress Notes (Signed)
  Radiation Oncology         [phone] ________________________________  Name: Makayla Good MRN: [id number]  Date: 07/04/2022  DOB: 12/20/1970  SIMULATION AND TREATMENT PLANNING NOTE    ICD-10-CM   1. Metastatic cancer to spine [facility])  C79.51       DIAGNOSIS:  52 y.o. patient with painful right sacral metastasis  NARRATIVE:  The patient was brought to the [facility].  Identity was confirmed.  All relevant records and images related to the planned course of therapy were reviewed.  The patient freely provided informed written consent to proceed with treatment after reviewing the details related to the planned course of therapy. The consent form was witnessed and verified by the simulation staff.  Then, the patient was set-up in a stable reproducible  supine position for radiation therapy.  CT images were obtained.  Surface markings were placed.  The CT images were loaded into the planning software.  Then the target and avoidance structures were contoured including kidneys.  Treatment planning then occurred.  The radiation prescription was entered and confirmed.  Then, I designed and supervised the construction of a total of 3 medically necessary complex treatment devices with VacLoc positioner and 2 MLCs to shield kidneys.  I have requested : 3D Simulation  I have requested a DVH of the following structures: Left Kidney, Right Kidney and target.  PLAN:  The patient will receive 30 Gy in 10 fractions.  ________________________________  Sheral Apley Tammi Klippel, M.D.

## 2022-07-09 ENCOUNTER — Other Ambulatory Visit: Payer: Self-pay

## 2022-07-09 ENCOUNTER — Ambulatory Visit
Admission: RE | Admit: 2022-07-09 | Discharge: 2022-07-09 | Disposition: A | Discharge status: Home / Self Care | Payer: BC Managed Care – PPO | Source: Ambulatory Visit | Attending: Radiation Oncology | Admitting: Radiation Oncology

## 2022-07-09 DIAGNOSIS — C7951 Secondary malignant neoplasm of bone: Secondary | ICD-10-CM | POA: Diagnosis not present

## 2022-07-09 DIAGNOSIS — C259 Malignant neoplasm of pancreas, unspecified: Secondary | ICD-10-CM

## 2022-07-09 LAB — RAD ONC ARIA SESSION SUMMARY
Course Elapsed Days: 0
Plan Fractions Treated to Date: 1
Plan Prescribed Dose Per Fraction: 4 Gy
Plan Total Fractions Prescribed: 5
Plan Total Prescribed Dose: 20 Gy
Reference Point Dosage Given to Date: 4 Gy
Reference Point Session Dosage Given: 4 Gy
Session Number: 1

## 2022-07-10 ENCOUNTER — Ambulatory Visit
Admission: RE | Admit: 2022-07-10 | Discharge: 2022-07-10 | Disposition: A | Discharge status: Home / Self Care | Payer: BC Managed Care – PPO | Source: Ambulatory Visit | Attending: Radiation Oncology | Admitting: Radiation Oncology

## 2022-07-10 ENCOUNTER — Other Ambulatory Visit: Payer: Self-pay

## 2022-07-10 ENCOUNTER — Other Ambulatory Visit: Payer: Self-pay | Admitting: Physical Medicine and Rehabilitation

## 2022-07-10 DIAGNOSIS — C7951 Secondary malignant neoplasm of bone: Secondary | ICD-10-CM | POA: Diagnosis not present

## 2022-07-10 DIAGNOSIS — Z510 Encounter for antineoplastic radiation therapy: Secondary | ICD-10-CM | POA: Diagnosis not present

## 2022-07-10 DIAGNOSIS — C259 Malignant neoplasm of pancreas, unspecified: Secondary | ICD-10-CM | POA: Diagnosis not present

## 2022-07-10 LAB — RAD ONC ARIA SESSION SUMMARY
Course Elapsed Days: 1
Plan Fractions Treated to Date: 2
Plan Prescribed Dose Per Fraction: 4 Gy
Plan Total Fractions Prescribed: 5
Plan Total Prescribed Dose: 20 Gy
Reference Point Dosage Given to Date: 8 Gy
Reference Point Session Dosage Given: 4 Gy
Session Number: 2

## 2022-07-11 ENCOUNTER — Ambulatory Visit
Admission: RE | Admit: 2022-07-11 | Discharge: 2022-07-11 | Disposition: A | Discharge status: Home / Self Care | Payer: BC Managed Care – PPO | Source: Ambulatory Visit | Attending: Radiation Oncology | Admitting: Radiation Oncology

## 2022-07-11 ENCOUNTER — Other Ambulatory Visit: Payer: Self-pay

## 2022-07-11 DIAGNOSIS — C259 Malignant neoplasm of pancreas, unspecified: Secondary | ICD-10-CM | POA: Diagnosis not present

## 2022-07-11 DIAGNOSIS — C7951 Secondary malignant neoplasm of bone: Secondary | ICD-10-CM | POA: Diagnosis not present

## 2022-07-11 DIAGNOSIS — Z510 Encounter for antineoplastic radiation therapy: Secondary | ICD-10-CM | POA: Diagnosis not present

## 2022-07-11 LAB — RAD ONC ARIA SESSION SUMMARY
Course Elapsed Days: 2
Plan Fractions Treated to Date: 3
Plan Prescribed Dose Per Fraction: 4 Gy
Plan Total Fractions Prescribed: 5
Plan Total Prescribed Dose: 20 Gy
Reference Point Dosage Given to Date: 12 Gy
Reference Point Session Dosage Given: 4 Gy
Session Number: 3

## 2022-07-12 ENCOUNTER — Other Ambulatory Visit: Payer: Self-pay

## 2022-07-12 ENCOUNTER — Ambulatory Visit
Admission: RE | Admit: 2022-07-12 | Discharge: 2022-07-12 | Disposition: A | Discharge status: Home / Self Care | Payer: BC Managed Care – PPO | Source: Ambulatory Visit | Attending: Radiation Oncology | Admitting: Radiation Oncology

## 2022-07-12 DIAGNOSIS — Z510 Encounter for antineoplastic radiation therapy: Secondary | ICD-10-CM | POA: Diagnosis not present

## 2022-07-12 DIAGNOSIS — C7951 Secondary malignant neoplasm of bone: Secondary | ICD-10-CM | POA: Diagnosis not present

## 2022-07-12 DIAGNOSIS — C259 Malignant neoplasm of pancreas, unspecified: Secondary | ICD-10-CM | POA: Diagnosis not present

## 2022-07-12 LAB — RAD ONC ARIA SESSION SUMMARY
Course Elapsed Days: 3
Plan Fractions Treated to Date: 4
Plan Prescribed Dose Per Fraction: 4 Gy
Plan Total Fractions Prescribed: 5
Plan Total Prescribed Dose: 20 Gy
Reference Point Dosage Given to Date: 16 Gy
Reference Point Session Dosage Given: 4 Gy
Session Number: 4

## 2022-07-13 ENCOUNTER — Ambulatory Visit
Admission: RE | Admit: 2022-07-13 | Discharge: 2022-07-13 | Disposition: A | Discharge status: Home / Self Care | Payer: BC Managed Care – PPO | Source: Ambulatory Visit | Attending: Radiation Oncology | Admitting: Radiation Oncology

## 2022-07-13 ENCOUNTER — Encounter: Payer: Self-pay | Admitting: Urology

## 2022-07-13 ENCOUNTER — Ambulatory Visit: Payer: BC Managed Care – PPO

## 2022-07-13 ENCOUNTER — Other Ambulatory Visit: Payer: Self-pay

## 2022-07-13 DIAGNOSIS — Z510 Encounter for antineoplastic radiation therapy: Secondary | ICD-10-CM | POA: Diagnosis not present

## 2022-07-13 DIAGNOSIS — C259 Malignant neoplasm of pancreas, unspecified: Secondary | ICD-10-CM | POA: Diagnosis not present

## 2022-07-13 DIAGNOSIS — C7951 Secondary malignant neoplasm of bone: Secondary | ICD-10-CM | POA: Diagnosis not present

## 2022-07-13 LAB — RAD ONC ARIA SESSION SUMMARY
Course Elapsed Days: 4
Plan Fractions Treated to Date: 5
Plan Prescribed Dose Per Fraction: 4 Gy
Plan Total Fractions Prescribed: 5
Plan Total Prescribed Dose: 20 Gy
Reference Point Dosage Given to Date: 20 Gy
Reference Point Session Dosage Given: 4 Gy
Session Number: 5

## 2022-07-15 ENCOUNTER — Other Ambulatory Visit: Payer: Self-pay

## 2022-07-15 ENCOUNTER — Encounter (HOSPITAL_COMMUNITY): Payer: Self-pay

## 2022-07-15 ENCOUNTER — Emergency Department (HOSPITAL_COMMUNITY)
Admission: EM | Admit: 2022-07-15 | Discharge: 2022-07-15 | Disposition: A | Discharge status: Home / Self Care | Payer: BC Managed Care – PPO | Attending: Emergency Medicine | Admitting: Emergency Medicine

## 2022-07-15 ENCOUNTER — Emergency Department (HOSPITAL_COMMUNITY): Payer: BC Managed Care – PPO

## 2022-07-15 DIAGNOSIS — R000 Tachycardia, unspecified: Secondary | ICD-10-CM | POA: Diagnosis not present

## 2022-07-15 DIAGNOSIS — Z79899 Other long term (current) drug therapy: Secondary | ICD-10-CM | POA: Diagnosis not present

## 2022-07-15 DIAGNOSIS — Z7951 Long term (current) use of inhaled steroids: Secondary | ICD-10-CM | POA: Diagnosis not present

## 2022-07-15 DIAGNOSIS — G523 Disorders of hypoglossal nerve: Secondary | ICD-10-CM | POA: Diagnosis not present

## 2022-07-15 DIAGNOSIS — J45909 Unspecified asthma, uncomplicated: Secondary | ICD-10-CM | POA: Diagnosis not present

## 2022-07-15 DIAGNOSIS — R519 Headache, unspecified: Secondary | ICD-10-CM | POA: Diagnosis not present

## 2022-07-15 DIAGNOSIS — K148 Other diseases of tongue: Secondary | ICD-10-CM

## 2022-07-15 DIAGNOSIS — I10 Essential (primary) hypertension: Secondary | ICD-10-CM | POA: Diagnosis not present

## 2022-07-15 DIAGNOSIS — K1329 Other disturbances of oral epithelium, including tongue: Secondary | ICD-10-CM | POA: Insufficient documentation

## 2022-07-15 DIAGNOSIS — Z8507 Personal history of malignant neoplasm of pancreas: Secondary | ICD-10-CM | POA: Insufficient documentation

## 2022-07-15 DIAGNOSIS — D649 Anemia, unspecified: Secondary | ICD-10-CM | POA: Insufficient documentation

## 2022-07-15 LAB — CBC WITH DIFFERENTIAL/PLATELET
Abs Immature Granulocytes: 0.04 10*3/uL (ref 0.00–0.07)
Basophils Absolute: 0 10*3/uL (ref 0.0–0.1)
Basophils Relative: 1 %
Eosinophils Absolute: 0.3 10*3/uL (ref 0.0–0.5)
Eosinophils Relative: 5 %
HCT: 27.7 % — ABNORMAL LOW (ref 36.0–46.0)
Hemoglobin: 9 g/dL — ABNORMAL LOW (ref 12.0–15.0)
Immature Granulocytes: 1 %
Lymphocytes Relative: 4 %
Lymphs Abs: 0.2 10*3/uL — ABNORMAL LOW (ref 0.7–4.0)
MCH: 33 pg (ref 26.0–34.0)
MCHC: 32.5 g/dL (ref 30.0–36.0)
MCV: 101.5 fL — ABNORMAL HIGH (ref 80.0–100.0)
Monocytes Absolute: 0.6 10*3/uL (ref 0.1–1.0)
Monocytes Relative: 11 %
Neutro Abs: 4.5 10*3/uL (ref 1.7–7.7)
Neutrophils Relative %: 78 %
Platelets: 241 10*3/uL (ref 150–400)
RBC: 2.73 MIL/uL — ABNORMAL LOW (ref 3.87–5.11)
RDW: 14.8 % (ref 11.5–15.5)
WBC: 5.6 10*3/uL (ref 4.0–10.5)
nRBC: 0 % (ref 0.0–0.2)

## 2022-07-15 LAB — COMPREHENSIVE METABOLIC PANEL
ALT: 26 U/L (ref 0–44)
AST: 25 U/L (ref 15–41)
Albumin: 2.8 g/dL — ABNORMAL LOW (ref 3.5–5.0)
Alkaline Phosphatase: 122 U/L (ref 38–126)
Anion gap: 9 (ref 5–15)
BUN: 9 mg/dL (ref 6–20)
CO2: 27 mmol/L (ref 22–32)
Calcium: 8.9 mg/dL (ref 8.9–10.3)
Chloride: 100 mmol/L (ref 98–111)
Creatinine, Ser: 0.39 mg/dL — ABNORMAL LOW (ref 0.44–1.00)
GFR, Estimated: >60 mL/min (ref >60)
Glucose, Bld: 132 mg/dL — ABNORMAL HIGH (ref 70–99)
Potassium: 3.4 mmol/L — ABNORMAL LOW (ref 3.5–5.1)
Sodium: 136 mmol/L (ref 135–145)
Total Bilirubin: 0.9 mg/dL (ref 0.3–1.2)
Total Protein: 6.5 g/dL (ref 6.5–8.1)

## 2022-07-15 MED ORDER — GADOBUTROL 1 MMOL/ML IV SOLN
7.5000 mL | Freq: Once | INTRAVENOUS | Status: AC | PRN
  Administered 2022-07-15: 7.5 mL via INTRAVENOUS

## 2022-07-15 MED ORDER — HYDROMORPHONE HCL 1 MG/ML IJ SOLN
1.0000 mg | Freq: Once | INTRAMUSCULAR | Status: AC
  Administered 2022-07-15: 1 mg via INTRAVENOUS
  Filled 2022-07-15: qty 1

## 2022-07-15 MED ORDER — LORAZEPAM 2 MG/ML IJ SOLN: 1.0000 mg | Freq: Once | INTRAMUSCULAR | Status: DC | PRN

## 2022-07-15 MED ORDER — HEPARIN SOD (PORK) LOCK FLUSH 100 UNIT/ML IV SOLN
500.0000 [IU] | Freq: Once | INTRAVENOUS | Status: AC
  Administered 2022-07-15: 500 [IU]
  Filled 2022-07-15: qty 5

## 2022-07-15 NOTE — ED Triage Notes (Signed)
Patient is here for evaluation of headache. States that she has been having a headache for 2 days. Also noted about an hour ago her "tongue twisted" and states that she noticed it was hard to move food around her mouth.

## 2022-07-15 NOTE — Discharge Instructions (Addendum)
Thank you for allowing me to be a part of your care today.  Your CT and MRI were both negative, which is reassuring.  I recommend following up with your primary care provider this week as well as your oncologist.   I recommend taking your Dilaudid and Tylenol as needed for pain.    Return to the ED if you have worsening of your symptoms, have difficulty speaking, swallowing, or breathing, or if you have any new concerns.

## 2022-07-15 NOTE — ED Provider Notes (Signed)
[facility] Provider Note   CSN: [id number] Arrival date & time: 07/15/22  1601     History  Chief Complaint  Patient presents with   Headache    Makayla Good is a 52 y.o. female with past medical history as outlined below presents to the ED complaining of a headache that has been persistent for the past 2 days.  She states that 1 hour ago her tongue felt "twisted" and she found it difficult to move food or water around in her mouth.  She had some associated difficulty swallowing due to her tongue not moving normally.  Denies weakness, tremors, syncope, seizures, speech difficulty, lightheadedness, dizziness, confusion, facial asymmetry, nausea, vomiting, neck pain.   Past Medical History:  Diagnosis Date   Asthma    Eczema    Fibroid    H/O oophorectomy    Hypertension    Obesity    Pancreatic adenocarcinoma ([place]) 03/01/2022   Polycystic ovary    Left   Sleep apnea    no CPAP per MD        Home Medications Prior to Admission medications   Medication Sig Start Date End Date Taking? Authorizing Provider  acetaminophen (TYLENOL) 500 MG tablet Take 1 tablet (500 mg total) by mouth every 6 (six) hours as needed for mild pain (or Fever >/= 101). 07/01/22   Danford, Suann Larry, MD  cyclobenzaprine (FLEXERIL) 10 MG tablet TAKE 1 TABLET BY MOUTH AT BEDTIME AS NEEDED FOR MUSCLE SPASMS. 07/10/22   Lorine Bears, NP  dexamethasone (DECADRON) 4 MG tablet Take 1 tablet (4 mg total) by mouth 3 (three) times daily. 07/01/22   Danford, Suann Larry, MD  Ensure Max Protein (ENSURE MAX PROTEIN) LIQD Take 330 mLs (11 oz total) by mouth daily. 05/13/22   Eugenie Filler, MD  escitalopram (LEXAPRO) 10 MG tablet TAKE 1 TABLET BY MOUTH EVERY DAY 06/28/22   Volanda Napoleon, MD  fentaNYL (DURAGESIC) 50 MCG/HR Place 1 patch onto the skin every 3 (three) days. 07/02/22   Danford, Suann Larry, MD  fluticasone (FLONASE) 50 MCG/ACT  nasal spray SPRAY 2 SPRAYS INTO EACH NOSTRIL EVERY DAY Patient taking differently: Place 1 spray into both nostrils daily as needed for allergies. 06/04/22   [name], DO  gabapentin (NEURONTIN) 300 MG capsule Take 1 capsule (300 mg total) by mouth 3 (three) times daily. 07/01/22   Danford, Suann Larry, MD  hydrochlorothiazide (HYDRODIURIL) 25 MG tablet Take 1 tablet (25 mg total) by mouth daily. 06/25/22   [name], DO  HYDROmorphone (DILAUDID) 2 MG tablet Take 1 tablet (2 mg total) by mouth every 4 (four) hours as needed for severe pain. 07/05/22   Volanda Napoleon, MD  Ipratropium-Albuterol (COMBIVENT) 20-100 MCG/ACT AERS respimat Inhale 1 puff into the lungs 3 (three) times daily for 5 days, THEN 1 puff every 6 (six) hours as needed for wheezing. Patient not taking: Reported on 06/29/2022 05/12/22 09/14/22  Eugenie Filler, MD  lidocaine-prilocaine (EMLA) cream Apply to affected area once 03/13/22   Volanda Napoleon, MD  loperamide (IMODIUM) 2 MG capsule Take 2 tabs by mouth with first loose stool, then 1 tab with each additional loose stool as needed. Do not exceed 8 tabs in a 24-hour period Patient taking differently: Take 2 mg by mouth daily as needed for diarrhea or loose stools. 03/13/22   Volanda Napoleon, MD  LORazepam (ATIVAN) 0.5 MG tablet Take  1 tablet (0.5 mg total) by mouth every 8 (eight) hours as needed for anxiety. 06/06/22   Celso Amy, NP  omeprazole (PRILOSEC) 40 MG capsule Take 1 capsule (40 mg total) by mouth daily. 05/16/22   Volanda Napoleon, MD  ondansetron (ZOFRAN) 8 MG tablet Take 1 tablet (8 mg total) by mouth every 8 (eight) hours as needed for nausea or vomiting. Start on the third day after cisplatin 03/13/22   Volanda Napoleon, MD  potassium chloride SA (KLOR-CON M20) 20 MEQ tablet TAKE 2 TABLETS BY MOUTH DAILY Patient taking differently: Take 20 mEq by mouth daily. 06/04/22   Volanda Napoleon, MD  prochlorperazine (COMPAZINE) 10 MG tablet Take  1 tablet (10 mg total) by mouth every 6 (six) hours as needed for nausea or vomiting (Nausea or vomiting). 03/13/22   Volanda Napoleon, MD  temazepam (RESTORIL) 15 MG capsule Take 1 capsule (15 mg total) by mouth at bedtime as needed for sleep. 02/28/22   Volanda Napoleon, MD      Allergies    Patient has no known allergies.    Review of Systems   Review of Systems  Gastrointestinal:  Negative for nausea and vomiting.  Musculoskeletal:  Negative for neck pain.  Neurological:  Positive for headaches. Negative for dizziness, tremors, seizures, syncope, facial asymmetry, speech difficulty, weakness, light-headedness and numbness.  Psychiatric/Behavioral:  Negative for confusion.     Physical Exam Updated Vital Signs BP 127/83   Pulse (!) 115   Temp 99.8 F (37.7 C) (Oral)   Resp 18   Ht 5\' 5"  (1.651 m)   Wt 76.2 kg   LMP 07/27/2020 (Within Days)   SpO2 98%   BMI 27.96 kg/m  Physical Exam Vitals and nursing note reviewed.  Constitutional:      General: She is not in acute distress.    Appearance: Normal appearance. She is not ill-appearing or diaphoretic.  HENT:     Head: Normocephalic and atraumatic.  Eyes:     General: Lids are normal. Vision grossly intact.     Extraocular Movements: Extraocular movements intact.     Pupils: Pupils are equal, round, and reactive to light.  Cardiovascular:     Rate and Rhythm: Regular rhythm. Tachycardia present.     Heart sounds: Normal heart sounds.  Pulmonary:     Effort: Pulmonary effort is normal.  Musculoskeletal:     Cervical back: Full passive range of motion without pain.  Skin:    General: Skin is warm and dry.     Capillary Refill: Capillary refill takes less than 2 seconds.  Neurological:     Mental Status: She is alert and oriented to person, place, and time. Mental status is at baseline.     Cranial Nerves: Cranial nerve deficit present.     Sensory: Sensation is intact.     Motor: Motor function is intact.      Coordination: Coordination is intact.     Gait: Gait is intact.     Comments: Cranial Nerves:  II: peripheral fields grossly intact III,IV, VI: ptosis not present, extra-ocular movements intact bilaterally, direct and consensual pupillary light reflexes intact bilaterally V: facial sensation, jaw opening, and bite strength equal bilaterally VII: eyebrow raise, eyelid close, smile, frown, pucker equal bilaterally VIII: hearing grossly normal bilaterally  IX,X: palate elevation and swallowing intact XI: bilateral shoulder shrug and lateral head rotation equal and strong XII: tongue deviated towards the left with poor extension of the left side  of the tongue Motor: 5/5 strength in bilateral upper and lower extremities, grip strength is equal bilaterally Sensation: Normal sensation to light touch    Psychiatric:        Mood and Affect: Mood normal.        Behavior: Behavior normal.     ED Results / Procedures / Treatments   Labs (all labs ordered are listed, but only abnormal results are displayed) Labs Reviewed  COMPREHENSIVE METABOLIC PANEL - Abnormal; Notable for the following components:      Result Value   Potassium 3.4 (*)    Glucose, Bld 132 (*)    Creatinine, Ser 0.39 (*)    Albumin 2.8 (*)    All other components within normal limits  CBC WITH DIFFERENTIAL/PLATELET - Abnormal; Notable for the following components:   RBC 2.73 (*)    Hemoglobin 9.0 (*)    HCT 27.7 (*)    MCV 101.5 (*)    Lymphs Abs 0.2 (*)    All other components within normal limits    EKG EKG Interpretation  Date/Time:  Sunday July 15 2022 16:19:11 EDT Ventricular Rate:  128 PR Interval:  139 QRS Duration: 92 QT Interval:  300 QTC Calculation: 438 R Axis:   42 Text Interpretation: Sinus tachycardia Borderline T abnormalities, inferior leads Confirmed by Lacretia Leigh ([id number]) on 07/15/2022 5:07:50 PM  Radiology MR Brain W and Wo Contrast  Result Date: 07/15/2022 CLINICAL DATA:  New headache  and hypo glossal nerve palsy, concern for pancreatic cancer arm metastatic disease. EXAM: MRI HEAD WITHOUT AND WITH CONTRAST TECHNIQUE: Multiplanar, multiecho pulse sequences of the brain and surrounding structures were obtained without and with intravenous contrast. CONTRAST:  7.82mL GADAVIST GADOBUTROL 1 MMOL/ML IV SOLN COMPARISON:  09/15/2020 FINDINGS: Brain: No restricted diffusion to suggest acute or subacute infarct. No abnormal parenchymal or meningeal enhancement. No acute hemorrhage, mass, mass effect, or midline shift. No hydrocephalus or extra-axial collection. Normal pituitary and craniocervical junction. No hemosiderin deposition to suggest remote hemorrhage. Normal cerebral volume for age. Vascular: Normal arterial flow voids. Normal arterial and venous enhancement. Skull and upper cervical spine: Normal marrow signal. Sinuses/Orbits: Clear paranasal sinuses. No acute finding in the orbits. Other: The mastoid air cells are well aerated. IMPRESSION: No acute intracranial process. No evidence of acute infarct or metastatic disease in the brain. Electronically Signed   By: Merilyn Baba M.D.   On: 07/15/2022 19:30   CT Head Wo Contrast  Result Date: 07/15/2022 CLINICAL DATA:  Headache, new onset (Age >= 60y) known metastatic disease to spine EXAM: CT HEAD WITHOUT CONTRAST TECHNIQUE: Contiguous axial images were obtained from the base of the skull through the vertex without intravenous contrast. RADIATION DOSE REDUCTION: This exam was performed according to the departmental dose-optimization program which includes automated exposure control, adjustment of the mA and/or kV according to patient size and/or use of iterative reconstruction technique. COMPARISON:  09/15/2020 FINDINGS: Brain: No evidence of acute infarction, hemorrhage, hydrocephalus, extra-axial collection or mass lesion/mass effect. Vascular: No hyperdense vessel or unexpected calcification. Skull: Normal. Negative for fracture or focal  lesion. Sinuses/Orbits: No acute finding. Other: None. IMPRESSION: No acute intracranial abnormality. Electronically Signed   By: Davina Poke D.O.   On: 07/15/2022 17:15    Procedures Procedures    Medications Ordered in ED Medications  HYDROmorphone (DILAUDID) injection 1 mg (1 mg Intravenous Given 07/15/22 1709)  HYDROmorphone (DILAUDID) injection 1 mg (1 mg Intravenous Given 07/15/22 1802)  gadobutrol (GADAVIST) 1 MMOL/ML injection 7.5 mL (7.5  mLs Intravenous Contrast Given 07/15/22 1840)    ED Course/ Medical Decision Making/ A&P                             Medical Decision Making Amount and/or Complexity of Data Reviewed Labs: ordered. Radiology: ordered.  Risk Prescription drug management.   This patient presents to the ED with chief complaint(s) of headache and tongue problem with pertinent past medical history of metastatic pancreatic cancer, hypertension.  The complaint involves an extensive differential diagnosis and also carries with it a high risk of complications and morbidity.    The differential diagnosis includes CVA, intracranial lesion, intracranial hemorrhage, brain mets, injury to cranial nerve, atypical migraine  The initial plan is to obtain baseline labs and CT head  Additional history obtained: Records reviewed  oncology notes.  Patient has been receiving radiation to treat known metastases to the spine.  Reviewed prior MRI imaging results related to the spine.  Initial Assessment:   Exam significant for tongue deviation to the left with poor extension of the left side of the tongue during neuro exam.  All other cranial nerves without deficit.  5/5 strength in bilateral upper and lower extremities with equal grip strength.  No pronator drift.  Speech is not slurred.  She has normal sensation to light touch.  EOM intact, PERRL.  Full range of motion of the neck without pain.  Independent ECG/labs interpretation:  The following labs were independently  interpreted:  Metabolic panel with findings consistent with patient's baseline.  CBC with macrocytic anemia which is also patient's baseline.  Independent visualization and interpretation of imaging: I independently visualized the following imaging with scope of interpretation limited to determining acute life threatening conditions related to emergency care: CT head, which revealed no acute intracranial findings to explain patient's symptoms, no evidence of stroke.  I agree with radiologist interpretation.  Based on patient's symptoms and abnormal hypoglossal nerve function, will order MRI of brain with and without contrast to assess for lesions or mets.  MRI negative for intracranial findings.  Treatment and Reassessment: Patient's headache pain was treated successfully with IV Dilaudid.  This is the medication that she takes at home for her cancer related pain.  She reports that she is feeling better.  Disposition:   Patient has follow-up appointment scheduled with her oncologist this Wednesday.  Also encourage patient to follow-up with her primary care doctor.  Recommended patient take her Dilaudid as needed for pain.  The patient has been appropriately medically screened and/or stabilized in the ED. I have low suspicion for any other emergent medical condition which would require further screening, evaluation or treatment in the ED or require inpatient management. At time of discharge the patient is hemodynamically stable and in no acute distress. I have discussed work-up results and diagnosis with patient and answered all questions. Patient is agreeable with discharge plan. We discussed strict return precautions for returning to the emergency department and they verbalized understanding.          Final Clinical Impression(s) / ED Diagnoses Final diagnoses:  Acute nonintractable headache, unspecified headache type  Tongue deviation    Rx / DC Orders ED Discharge Orders     None          Pat Kocher, [place] 07/15/22 2346    Lacretia Leigh, MD 07/17/22 1739

## 2022-07-16 ENCOUNTER — Encounter: Payer: Self-pay | Admitting: Internal Medicine

## 2022-07-16 ENCOUNTER — Ambulatory Visit: Payer: BC Managed Care – PPO | Admitting: Internal Medicine

## 2022-07-16 VITALS — BP 130/80 | HR 122 | Temp 98.4°F | Resp 18 | Ht 65.0 in | Wt 168.5 lb

## 2022-07-16 DIAGNOSIS — G523 Disorders of hypoglossal nerve: Secondary | ICD-10-CM | POA: Diagnosis not present

## 2022-07-16 NOTE — Progress Notes (Signed)
Subjective:    Patient ID: Makayla Good, female    DOB: 1970-10-30, 52 y.o.   MRN: [id number]  DOS:  07/16/2022 Type of visit - description: ER f/u  Went to the ER yesterday: The patient noted weakness on one side of the tongue, her tongue was heavy, could not move it as she normally does. She has some difficulty swallowing and speaking.  Went to the ER, workup: - CT and MRI brain and head no acute - Hemoglobin 9, slightly lower than usual.  Creatinine 0.3, potassium 3.4 close to baseline.  Since then, symptoms are slightly better  Denies fevers, no sore throat.  Does have left-sided dental cavity that needs some work but no major pain or swelling. Denies dizziness, diplopia, face weakness or motor deficits. Has some difficulty manipulating food within her mouth but no dysphagia per se  Review of Systems See above   Past Medical History:  Diagnosis Date   Asthma    Eczema    Fibroid    H/O oophorectomy    Hypertension    Obesity    Pancreatic adenocarcinoma ([place]) 03/01/2022   Polycystic ovary    Left   Sleep apnea    no CPAP per MD    Past Surgical History:  Procedure Laterality Date   BACK SURGERY     30yrs ago   BREAST BIOPSY Right 2019   fibroadenoma   CESAREAN SECTION  02/01/2005   CESAREAN SECTION  2001   IR IMAGING GUIDED PORT INSERTION  03/07/2022   LAPAROSCOPIC GASTRIC SLEEVE RESECTION N/A 07/29/2017   Procedure: LAPAROSCOPIC GASTRIC SLEEVE RESECTION WITH UPPER ENDO ;  Surgeon: [name], MD;  Location: [facility];  Service: General;  Laterality: N/A;   OOPHORECTOMY     Rt.removed   TUBAL LIGATION  02/01/2005    Current Outpatient Medications  Medication Instructions   acetaminophen (TYLENOL) 500 mg, Oral, Every 6 hours PRN   cyclobenzaprine (FLEXERIL) 10 MG tablet TAKE 1 TABLET BY MOUTH AT BEDTIME AS NEEDED FOR MUSCLE SPASMS.   dexamethasone (DECADRON) 4 mg, Oral, 3 times daily   Ensure Max Protein (ENSURE MAX PROTEIN) LIQD 11 oz,  Oral, Daily   escitalopram (LEXAPRO) 10 mg, Oral, Daily   fentaNYL (DURAGESIC) 50 MCG/HR 1 patch, Transdermal, every 72 hours   fluticasone (FLONASE) 50 MCG/ACT nasal spray SPRAY 2 SPRAYS INTO EACH NOSTRIL EVERY DAY   gabapentin (NEURONTIN) 300 mg, Oral, 3 times daily   hydrochlorothiazide (HYDRODIURIL) 25 mg, Oral, Daily   HYDROmorphone (DILAUDID) 2 mg, Oral, Every 4 hours PRN   Ipratropium-Albuterol (COMBIVENT) 20-100 MCG/ACT AERS respimat Inhale 1 puff into the lungs 3 (three) times daily for 5 days, THEN 1 puff every 6 (six) hours as needed for wheezing.   lidocaine-prilocaine (EMLA) cream Apply to affected area once   loperamide (IMODIUM) 2 MG capsule Take 2 tabs by mouth with first loose stool, then 1 tab with each additional loose stool as needed. Do not exceed 8 tabs in a 24-hour period   LORazepam (ATIVAN) 0.5 mg, Oral, Every 8 hours PRN   omeprazole (PRILOSEC) 40 mg, Oral, Daily   ondansetron (ZOFRAN) 8 mg, Oral, Every 8 hours PRN, Start on the third day after cisplatin   potassium chloride SA (KLOR-CON M20) 20 MEQ tablet 40 mEq, Oral, Daily   prochlorperazine (COMPAZINE) 10 mg, Oral, Every 6 hours PRN   temazepam (RESTORIL) 15 mg, Oral, At bedtime PRN       Objective:   Physical Exam  BP 130/80   Pulse (!) 122   Temp 98.4 F (36.9 C) (Oral)   Resp 18   Ht 5\' 5"  (1.651 m)   Wt 168 lb 8 oz (76.4 kg)   LMP 07/27/2020 (Within Days)   SpO2 97%   BMI 28.04 kg/m  General:   Well developed, NAD, BMI noted. HEENT:  Normocephalic . Face symmetric, atraumatic. Neck: No swelling, no large lymph nodes Lungs:  CTA B Normal respiratory effort, no intercostal retractions, no accessory muscle use. Heart: Tachycardic  lower extremities: no pretibial edema bilaterally  Skin: Not pale. Not jaundice Neurologic:  alert & oriented X3.  Speech normal, gait appropriate for age, assisted by cane  Face symmetric, external ocular movements intact. Tongue: slightly deviated to the left.  Otherwise motor symmetric  psych--  Cognition and judgment appear intact.  Cooperative with normal attention span and concentration.  Behavior appropriate. No anxious or depressed appearing.      Assessment     52 year old female, PMH includes metastatic adenocarcinoma of the pancreas, had XRT last week;  HTN, anxiety, anemia of chronic disease, presents for  ER follow-up  Hypoglossal  cranial nerve deficit. MRI brain CT head unrevealing, exam negative except for the cranial nerve defect. Etiology unclear, will ask neurology to assess.

## 2022-07-16 NOTE — Patient Instructions (Signed)
Refer   to neurology  If you notice any new symptoms please reach out immediately

## 2022-07-18 ENCOUNTER — Encounter: Payer: Self-pay | Admitting: *Deleted

## 2022-07-18 ENCOUNTER — Inpatient Hospital Stay: Payer: BC Managed Care – PPO | Admitting: Licensed Clinical Social Worker

## 2022-07-18 ENCOUNTER — Inpatient Hospital Stay: Payer: BC Managed Care – PPO | Admitting: Hematology & Oncology

## 2022-07-18 ENCOUNTER — Encounter: Payer: Self-pay | Admitting: Hematology & Oncology

## 2022-07-18 ENCOUNTER — Inpatient Hospital Stay: Payer: BC Managed Care – PPO

## 2022-07-18 VITALS — BP 121/94 | HR 134 | Temp 98.4°F | Resp 18 | Wt 169.0 lb

## 2022-07-18 DIAGNOSIS — Z9221 Personal history of antineoplastic chemotherapy: Secondary | ICD-10-CM | POA: Diagnosis not present

## 2022-07-18 DIAGNOSIS — C250 Malignant neoplasm of head of pancreas: Secondary | ICD-10-CM | POA: Diagnosis not present

## 2022-07-18 DIAGNOSIS — C7951 Secondary malignant neoplasm of bone: Secondary | ICD-10-CM | POA: Diagnosis not present

## 2022-07-18 DIAGNOSIS — R4589 Other symptoms and signs involving emotional state: Secondary | ICD-10-CM

## 2022-07-18 DIAGNOSIS — C787 Secondary malignant neoplasm of liver and intrahepatic bile duct: Secondary | ICD-10-CM | POA: Diagnosis not present

## 2022-07-18 DIAGNOSIS — Z923 Personal history of irradiation: Secondary | ICD-10-CM | POA: Diagnosis not present

## 2022-07-18 DIAGNOSIS — D509 Iron deficiency anemia, unspecified: Secondary | ICD-10-CM

## 2022-07-18 DIAGNOSIS — C259 Malignant neoplasm of pancreas, unspecified: Secondary | ICD-10-CM

## 2022-07-18 DIAGNOSIS — Z66 Do not resuscitate: Secondary | ICD-10-CM | POA: Diagnosis not present

## 2022-07-18 DIAGNOSIS — F418 Other specified anxiety disorders: Secondary | ICD-10-CM

## 2022-07-18 LAB — CBC WITH DIFFERENTIAL (CANCER CENTER ONLY)
Abs Immature Granulocytes: 0.06 10*3/uL (ref 0.00–0.07)
Basophils Absolute: 0 10*3/uL (ref 0.0–0.1)
Basophils Relative: 0 %
Eosinophils Absolute: 0.3 10*3/uL (ref 0.0–0.5)
Eosinophils Relative: 7 %
HCT: 29.5 % — ABNORMAL LOW (ref 36.0–46.0)
Hemoglobin: 9.7 g/dL — ABNORMAL LOW (ref 12.0–15.0)
Immature Granulocytes: 1 %
Lymphocytes Relative: 6 %
Lymphs Abs: 0.3 10*3/uL — ABNORMAL LOW (ref 0.7–4.0)
MCH: 32.4 pg (ref 26.0–34.0)
MCHC: 32.9 g/dL (ref 30.0–36.0)
MCV: 98.7 fL (ref 80.0–100.0)
Monocytes Absolute: 0.6 10*3/uL (ref 0.1–1.0)
Monocytes Relative: 12 %
Neutro Abs: 3.5 10*3/uL (ref 1.7–7.7)
Neutrophils Relative %: 74 %
Platelet Count: 250 10*3/uL (ref 150–400)
RBC: 2.99 MIL/uL — ABNORMAL LOW (ref 3.87–5.11)
RDW: 14.4 % (ref 11.5–15.5)
WBC Count: 4.8 10*3/uL (ref 4.0–10.5)
nRBC: 0 % (ref 0.0–0.2)

## 2022-07-18 LAB — FERRITIN: Ferritin: 1804 ng/mL — ABNORMAL HIGH (ref 11–307)

## 2022-07-18 LAB — CMP (CANCER CENTER ONLY)
ALT: 17 U/L (ref 0–44)
AST: 21 U/L (ref 15–41)
Albumin: 3.7 g/dL (ref 3.5–5.0)
Alkaline Phosphatase: 139 U/L — ABNORMAL HIGH (ref 38–126)
Anion gap: 12 (ref 5–15)
BUN: 6 mg/dL (ref 6–20)
CO2: 30 mmol/L (ref 22–32)
Calcium: 9.6 mg/dL (ref 8.9–10.3)
Chloride: 95 mmol/L — ABNORMAL LOW (ref 98–111)
Creatinine: 0.41 mg/dL — ABNORMAL LOW (ref 0.44–1.00)
GFR, Estimated: >60 mL/min (ref >60)
Glucose, Bld: 121 mg/dL — ABNORMAL HIGH (ref 70–99)
Potassium: 3.6 mmol/L (ref 3.5–5.1)
Sodium: 137 mmol/L (ref 135–145)
Total Bilirubin: 1 mg/dL (ref 0.3–1.2)
Total Protein: 6.9 g/dL (ref 6.5–8.1)

## 2022-07-18 LAB — IRON AND IRON BINDING CAPACITY (CC-WL,HP ONLY)
Iron: 34 ug/dL (ref 28–170)
Saturation Ratios: 16 % (ref 10.4–31.8)
TIBC: 214 ug/dL — ABNORMAL LOW (ref 250–450)
UIBC: 180 ug/dL (ref 148–442)

## 2022-07-18 LAB — RETICULOCYTES
Immature Retic Fract: 26.2 % — ABNORMAL HIGH (ref 2.3–15.9)
RBC.: 3.02 MIL/uL — ABNORMAL LOW (ref 3.87–5.11)
Retic Count, Absolute: 75.8 10*3/uL (ref 19.0–186.0)
Retic Ct Pct: 2.5 % (ref 0.4–3.1)

## 2022-07-18 LAB — LACTATE DEHYDROGENASE: LDH: 346 U/L — ABNORMAL HIGH (ref 98–192)

## 2022-07-18 NOTE — Progress Notes (Signed)
[facility] CSW Progress Note  Holiday representative met with patient to provide support after deciding to receive Hospice care.  Her quality of life has decreased and she stated her recovery time from treatment is difficult.  Patient was tearful during the visit.  She had her advance directives notarized and gave to CSW to make copies.  CSW to forward to HIM to be entered in her chart.  Explored ways to leave her legacy.    Rodman Pickle Harlyn Rathmann, LCSW

## 2022-07-18 NOTE — Progress Notes (Signed)
Due to progressive disease, patient is ready to enroll in hospice. Will discontinue active navigation but be available to the patient as needed.   Oncology Nurse Navigator Documentation     07/18/2022    9:30 AM  Oncology Nurse Navigator Flowsheets  Navigation Complete Date: 07/18/2022  Post Navigation: Continue to Follow Patient? No  Reason Not Navigating Patient: Airline pilot Encounter Type Appt/Treatment Plan Review  Patient Visit Type MedOnc  Treatment Phase Other  Time Spent with Patient 15

## 2022-07-18 NOTE — Progress Notes (Signed)
Hematology and Oncology Follow Up Visit  Aubri Sjoberg [id number] [birth year] 52 y.o. 07/18/2022   Principle Diagnosis:  Metastatic adenocarcinoma of the pancreas-liver metastasis -- NO actionable mutation   Current Therapy:        FOLFIRINOX -s/p cycle #4 - start on 04/02/2022 XRT to spine -- completed on 03/23/2022   Interim History:  Ms. Wiltbank is here today for follow-up.  Unfortunate, I think it is apparent that her disease is progressing.  She had a CT of the body that was done on 06/22/2022.  This essentially showed that there was disease progression.  She has had 4 cycles of FOLFIRINOX.  She really has had a tough time with treatment.  She really wants to focus on her quality of life.  I totally agree with that.  She was in the ER recently.  She was having some strokelike symptoms.  She had a CT of the head and also MRI of the brain.  Both of these were negative.  However, I suspect that given the nature of her cancer with hypercoagulability, it would not surprise me if she did have some TIA issues.  I think that trying a second line of therapy might be more harmful than good.  I told her that I thought that if we tried a second line of therapy, that the chance of responding would be probably less than 20%.  We had a long talk.  I think her niece was with her.  We decided that we need to focus on her quality of life and make sure that she has quality of life.  As such, we talked about getting Hospice involved.  I told her that I thought that Hospice probably would not have to do much right now.  At least, they would know about her.  Pain wise, she seems to be doing okay.  I know this is quite difficult.  I know she has thought hard about this.  Again, I does want to make sure that she has a good quality of life.  I did tell her to try some baby aspirin.  Although this may not be a bad idea for her with respect to any type of cerebrovascular issues.  Her appetite  is okay.  She has had no nausea or vomiting.  She has had no diarrhea.  She has had no issues urinating.   There has been no issues with cough or shortness of breath.  Currently, I would have said that her performance status is probably ECOG 2.    Medications:  Allergies as of 07/18/2022   No Known Allergies      Medication List        Accurate as of July 18, 2022  8:51 AM. If you have any questions, ask your nurse or doctor.          acetaminophen 500 MG tablet Commonly known as: TYLENOL Take 1 tablet (500 mg total) by mouth every 6 (six) hours as needed for mild pain (or Fever >/= 101).   cyclobenzaprine 10 MG tablet Commonly known as: FLEXERIL TAKE 1 TABLET BY MOUTH AT BEDTIME AS NEEDED FOR MUSCLE SPASMS.   dexamethasone 4 MG tablet Commonly known as: DECADRON Take 1 tablet (4 mg total) by mouth 3 (three) times daily.   Ensure Max Protein Liqd Take 330 mLs (11 oz total) by mouth daily.   escitalopram 10 MG tablet Commonly known as: LEXAPRO TAKE 1 TABLET BY MOUTH EVERY DAY   fentaNYL 50  MCG/HR Commonly known as: [place] 1 patch onto the skin every 3 (three) days.   fluticasone 50 MCG/ACT nasal spray Commonly known as: FLONASE SPRAY 2 SPRAYS INTO EACH NOSTRIL EVERY DAY What changed: See the new instructions.   gabapentin 300 MG capsule Commonly known as: NEURONTIN Take 1 capsule (300 mg total) by mouth 3 (three) times daily.   hydrochlorothiazide 25 MG tablet Commonly known as: HYDRODIURIL Take 1 tablet (25 mg total) by mouth daily.   HYDROmorphone 2 MG tablet Commonly known as: DILAUDID Take 1 tablet (2 mg total) by mouth every 4 (four) hours as needed for severe pain.   Ipratropium-Albuterol 20-100 MCG/ACT Aers respimat Commonly known as: COMBIVENT Inhale 1 puff into the lungs 3 (three) times daily for 5 days, THEN 1 puff every 6 (six) hours as needed for wheezing. Start taking on: May 12, 2022   Klor-Con M20 20 MEQ tablet Generic  drug: potassium chloride SA TAKE 2 TABLETS BY MOUTH DAILY What changed: how much to take   lidocaine-prilocaine cream Commonly known as: EMLA Apply to affected area once   loperamide 2 MG capsule Commonly known as: IMODIUM Take 2 tabs by mouth with first loose stool, then 1 tab with each additional loose stool as needed. Do not exceed 8 tabs in a 24-hour period What changed:  how much to take how to take this when to take this reasons to take this additional instructions   LORazepam 0.5 MG tablet Commonly known as: ATIVAN Take 1 tablet (0.5 mg total) by mouth every 8 (eight) hours as needed for anxiety.   omeprazole 40 MG capsule Commonly known as: PRILOSEC Take 1 capsule (40 mg total) by mouth daily.   ondansetron 8 MG tablet Commonly known as: Zofran Take 1 tablet (8 mg total) by mouth every 8 (eight) hours as needed for nausea or vomiting. Start on the third day after cisplatin   prochlorperazine 10 MG tablet Commonly known as: COMPAZINE Take 1 tablet (10 mg total) by mouth every 6 (six) hours as needed for nausea or vomiting (Nausea or vomiting).   temazepam 15 MG capsule Commonly known as: RESTORIL Take 1 capsule (15 mg total) by mouth at bedtime as needed for sleep.        Allergies: No Known Allergies  Past Medical History, Surgical history, Social history, and Family History were reviewed and updated.  Review of Systems: Review of Systems  Constitutional:  Positive for malaise/fatigue.  HENT: Negative.    Eyes: Negative.   Respiratory: Negative.    Cardiovascular: Negative.   Gastrointestinal:  Positive for abdominal pain and constipation.  Genitourinary: Negative.   Musculoskeletal:  Positive for back pain.  Skin: Negative.   Neurological:  Positive for weakness.  Endo/Heme/Allergies: Negative.   Psychiatric/Behavioral: Negative.       Physical Exam: Her vital signs show temperature of 98.4.  Pulse 130.  Blood pressure 121/94.  Weight is 169  pounds.  Wt Readings from Last 3 Encounters:  07/16/22 168 lb 8 oz (76.4 kg)  07/15/22 168 lb (76.2 kg)  06/30/22 164 lb 11.2 oz (74.7 kg)   Physical Exam Vitals reviewed.  HENT:     Head: Normocephalic and atraumatic.  Eyes:     Pupils: Pupils are equal, round, and reactive to light.  Cardiovascular:     Rate and Rhythm: Normal rate and regular rhythm.     Heart sounds: Normal heart sounds.  Pulmonary:     Effort: Pulmonary effort is normal.  Breath sounds: Normal breath sounds.  Abdominal:     General: Bowel sounds are normal.     Palpations: Abdomen is soft.  Musculoskeletal:        General: No tenderness or deformity. Normal range of motion.     Cervical back: Normal range of motion.  Lymphadenopathy:     Cervical: No cervical adenopathy.  Skin:    General: Skin is warm and dry.     Findings: No erythema or rash.  Neurological:     Mental Status: She is alert and oriented to person, place, and time.  Psychiatric:        Behavior: Behavior normal.        Thought Content: Thought content normal.        Judgment: Judgment normal.     Lab Results  Component Value Date   WBC 4.8 07/18/2022   HGB 9.7 (L) 07/18/2022   HCT 29.5 (L) 07/18/2022   MCV 98.7 07/18/2022   PLT 250 07/18/2022   Lab Results  Component Value Date   FERRITIN 1,133 (H) 06/06/2022   IRON 99 06/06/2022   TIBC 237 (L) 06/06/2022   UIBC 138 (L) 06/06/2022   IRONPCTSAT 42 (H) 06/06/2022   Lab Results  Component Value Date   RETICCTPCT 2.5 07/18/2022   RBC 2.99 (L) 07/18/2022   RBC 3.02 (L) 07/18/2022   No results found for: "KPAFRELGTCHN", "LAMBDASER", "KAPLAMBRATIO" No results found for: "IGGSERUM", "IGA", "IGMSERUM" No results found for: "TOTALPROTELP", "ALBUMINELP", "A1GS", "A2GS", "BETS", "BETA2SER", "GAMS", "MSPIKE", "SPEI"   Chemistry      Component Value Date/Time   NA 136 07/15/2022 1706   K 3.4 (L) 07/15/2022 1706   CL 100 07/15/2022 1706   CO2 27 07/15/2022 1706   BUN 9  07/15/2022 1706   CREATININE 0.39 (L) 07/15/2022 1706   CREATININE 0.45 06/06/2022 0945   CREATININE 0.79 12/08/2021 1420      Component Value Date/Time   CALCIUM 8.9 07/15/2022 1706   ALKPHOS 122 07/15/2022 1706   AST 25 07/15/2022 1706   AST 19 06/06/2022 0945   ALT 26 07/15/2022 1706   ALT 14 06/06/2022 0945   BILITOT 0.9 07/15/2022 1706   BILITOT 0.4 06/06/2022 0945       Impression and Plan: Ms. Antwine is a very pleasant 52 yo African American female with metastatic pancreatic adenocarcinoma.  She has had 4 cycles of chemotherapy with aggressive upfront chemotherapy with FOLFIRINOX.  Unfortunate, looks like she is progressing.  She wants her quality of life.  I think that second line chemotherapy would have very little effect on the cancer.  As such, I think we have to think about hospice for her.  We will make a referral to hospice.  I did talk to her about end-of-life issues.  I talked to her about her desire to be kept alive on her machine.  She does not want to be kept alive on a machine.  I told agree with this.  I told her that we are still, however, try to help her out with symptoms that she may have.   As such, she is a DO NOT RESUSCITATE.    We will still see her in the office.  We had a long talk.  I just feel bad.  I know she has tried really hard.  This is the typical course for pancreatic cancer.  I must that she did have a very unusual pancreatic cancer and that this went to a lot of her  bones.  We will make sure that hospice calls her.  I will like to get her back in about 3 weeks and we will see how she is feeling.    Volanda Napoleon, MD 3/27/20248:51 AM

## 2022-07-19 DIAGNOSIS — C259 Malignant neoplasm of pancreas, unspecified: Secondary | ICD-10-CM | POA: Diagnosis not present

## 2022-07-19 DIAGNOSIS — R634 Abnormal weight loss: Secondary | ICD-10-CM | POA: Diagnosis not present

## 2022-07-19 DIAGNOSIS — C787 Secondary malignant neoplasm of liver and intrahepatic bile duct: Secondary | ICD-10-CM | POA: Diagnosis not present

## 2022-07-19 DIAGNOSIS — F32A Depression, unspecified: Secondary | ICD-10-CM | POA: Diagnosis not present

## 2022-07-19 DIAGNOSIS — C7951 Secondary malignant neoplasm of bone: Secondary | ICD-10-CM | POA: Diagnosis not present

## 2022-07-19 DIAGNOSIS — K219 Gastro-esophageal reflux disease without esophagitis: Secondary | ICD-10-CM | POA: Diagnosis not present

## 2022-07-19 DIAGNOSIS — I10 Essential (primary) hypertension: Secondary | ICD-10-CM | POA: Diagnosis not present

## 2022-07-19 DIAGNOSIS — G893 Neoplasm related pain (acute) (chronic): Secondary | ICD-10-CM | POA: Diagnosis not present

## 2022-07-19 DIAGNOSIS — G4733 Obstructive sleep apnea (adult) (pediatric): Secondary | ICD-10-CM | POA: Diagnosis not present

## 2022-07-19 DIAGNOSIS — E785 Hyperlipidemia, unspecified: Secondary | ICD-10-CM | POA: Diagnosis not present

## 2022-07-20 DIAGNOSIS — C259 Malignant neoplasm of pancreas, unspecified: Secondary | ICD-10-CM | POA: Diagnosis not present

## 2022-07-20 DIAGNOSIS — F32A Depression, unspecified: Secondary | ICD-10-CM | POA: Diagnosis not present

## 2022-07-20 DIAGNOSIS — E785 Hyperlipidemia, unspecified: Secondary | ICD-10-CM | POA: Diagnosis not present

## 2022-07-20 DIAGNOSIS — G4733 Obstructive sleep apnea (adult) (pediatric): Secondary | ICD-10-CM | POA: Diagnosis not present

## 2022-07-20 DIAGNOSIS — I10 Essential (primary) hypertension: Secondary | ICD-10-CM | POA: Diagnosis not present

## 2022-07-20 DIAGNOSIS — C787 Secondary malignant neoplasm of liver and intrahepatic bile duct: Secondary | ICD-10-CM | POA: Diagnosis not present

## 2022-07-20 DIAGNOSIS — G893 Neoplasm related pain (acute) (chronic): Secondary | ICD-10-CM | POA: Diagnosis not present

## 2022-07-20 DIAGNOSIS — C7951 Secondary malignant neoplasm of bone: Secondary | ICD-10-CM | POA: Diagnosis not present

## 2022-07-20 DIAGNOSIS — K219 Gastro-esophageal reflux disease without esophagitis: Secondary | ICD-10-CM | POA: Diagnosis not present

## 2022-07-20 DIAGNOSIS — R634 Abnormal weight loss: Secondary | ICD-10-CM | POA: Diagnosis not present

## 2022-07-20 LAB — CANCER ANTIGEN 19-9: CA 19-9: 52 U/mL — ABNORMAL HIGH (ref 0–35)

## 2022-07-21 DIAGNOSIS — R634 Abnormal weight loss: Secondary | ICD-10-CM | POA: Diagnosis not present

## 2022-07-21 DIAGNOSIS — F32A Depression, unspecified: Secondary | ICD-10-CM | POA: Diagnosis not present

## 2022-07-21 DIAGNOSIS — E785 Hyperlipidemia, unspecified: Secondary | ICD-10-CM | POA: Diagnosis not present

## 2022-07-21 DIAGNOSIS — C259 Malignant neoplasm of pancreas, unspecified: Secondary | ICD-10-CM | POA: Diagnosis not present

## 2022-07-21 DIAGNOSIS — G4733 Obstructive sleep apnea (adult) (pediatric): Secondary | ICD-10-CM | POA: Diagnosis not present

## 2022-07-21 DIAGNOSIS — C7951 Secondary malignant neoplasm of bone: Secondary | ICD-10-CM | POA: Diagnosis not present

## 2022-07-21 DIAGNOSIS — C787 Secondary malignant neoplasm of liver and intrahepatic bile duct: Secondary | ICD-10-CM | POA: Diagnosis not present

## 2022-07-21 DIAGNOSIS — K219 Gastro-esophageal reflux disease without esophagitis: Secondary | ICD-10-CM | POA: Diagnosis not present

## 2022-07-21 DIAGNOSIS — G893 Neoplasm related pain (acute) (chronic): Secondary | ICD-10-CM | POA: Diagnosis not present

## 2022-07-21 DIAGNOSIS — I10 Essential (primary) hypertension: Secondary | ICD-10-CM | POA: Diagnosis not present

## 2022-07-22 DIAGNOSIS — R634 Abnormal weight loss: Secondary | ICD-10-CM | POA: Diagnosis not present

## 2022-07-22 DIAGNOSIS — C7951 Secondary malignant neoplasm of bone: Secondary | ICD-10-CM | POA: Diagnosis not present

## 2022-07-22 DIAGNOSIS — G4733 Obstructive sleep apnea (adult) (pediatric): Secondary | ICD-10-CM | POA: Diagnosis not present

## 2022-07-22 DIAGNOSIS — G893 Neoplasm related pain (acute) (chronic): Secondary | ICD-10-CM | POA: Diagnosis not present

## 2022-07-22 DIAGNOSIS — C787 Secondary malignant neoplasm of liver and intrahepatic bile duct: Secondary | ICD-10-CM | POA: Diagnosis not present

## 2022-07-22 DIAGNOSIS — K219 Gastro-esophageal reflux disease without esophagitis: Secondary | ICD-10-CM | POA: Diagnosis not present

## 2022-07-22 DIAGNOSIS — E785 Hyperlipidemia, unspecified: Secondary | ICD-10-CM | POA: Diagnosis not present

## 2022-07-22 DIAGNOSIS — C259 Malignant neoplasm of pancreas, unspecified: Secondary | ICD-10-CM | POA: Diagnosis not present

## 2022-07-22 DIAGNOSIS — I10 Essential (primary) hypertension: Secondary | ICD-10-CM | POA: Diagnosis not present

## 2022-07-22 DIAGNOSIS — F32A Depression, unspecified: Secondary | ICD-10-CM | POA: Diagnosis not present

## 2022-07-23 DIAGNOSIS — C7951 Secondary malignant neoplasm of bone: Secondary | ICD-10-CM | POA: Diagnosis not present

## 2022-07-23 DIAGNOSIS — C259 Malignant neoplasm of pancreas, unspecified: Secondary | ICD-10-CM | POA: Diagnosis not present

## 2022-07-23 DIAGNOSIS — C787 Secondary malignant neoplasm of liver and intrahepatic bile duct: Secondary | ICD-10-CM | POA: Diagnosis not present

## 2022-07-23 DIAGNOSIS — G893 Neoplasm related pain (acute) (chronic): Secondary | ICD-10-CM | POA: Diagnosis not present

## 2022-07-23 DIAGNOSIS — E785 Hyperlipidemia, unspecified: Secondary | ICD-10-CM | POA: Diagnosis not present

## 2022-07-23 DIAGNOSIS — I10 Essential (primary) hypertension: Secondary | ICD-10-CM | POA: Diagnosis not present

## 2022-07-23 DIAGNOSIS — R634 Abnormal weight loss: Secondary | ICD-10-CM | POA: Diagnosis not present

## 2022-07-23 DIAGNOSIS — F32A Depression, unspecified: Secondary | ICD-10-CM | POA: Diagnosis not present

## 2022-07-23 DIAGNOSIS — G4733 Obstructive sleep apnea (adult) (pediatric): Secondary | ICD-10-CM | POA: Diagnosis not present

## 2022-07-23 DIAGNOSIS — K219 Gastro-esophageal reflux disease without esophagitis: Secondary | ICD-10-CM | POA: Diagnosis not present

## 2022-07-24 DIAGNOSIS — G4733 Obstructive sleep apnea (adult) (pediatric): Secondary | ICD-10-CM | POA: Diagnosis not present

## 2022-07-24 DIAGNOSIS — I10 Essential (primary) hypertension: Secondary | ICD-10-CM | POA: Diagnosis not present

## 2022-07-24 DIAGNOSIS — C787 Secondary malignant neoplasm of liver and intrahepatic bile duct: Secondary | ICD-10-CM | POA: Diagnosis not present

## 2022-07-24 DIAGNOSIS — C259 Malignant neoplasm of pancreas, unspecified: Secondary | ICD-10-CM | POA: Diagnosis not present

## 2022-07-24 DIAGNOSIS — K219 Gastro-esophageal reflux disease without esophagitis: Secondary | ICD-10-CM | POA: Diagnosis not present

## 2022-07-24 DIAGNOSIS — F32A Depression, unspecified: Secondary | ICD-10-CM | POA: Diagnosis not present

## 2022-07-24 DIAGNOSIS — E785 Hyperlipidemia, unspecified: Secondary | ICD-10-CM | POA: Diagnosis not present

## 2022-07-24 DIAGNOSIS — G893 Neoplasm related pain (acute) (chronic): Secondary | ICD-10-CM | POA: Diagnosis not present

## 2022-07-24 DIAGNOSIS — C7951 Secondary malignant neoplasm of bone: Secondary | ICD-10-CM | POA: Diagnosis not present

## 2022-07-24 DIAGNOSIS — R634 Abnormal weight loss: Secondary | ICD-10-CM | POA: Diagnosis not present

## 2022-07-25 DIAGNOSIS — C787 Secondary malignant neoplasm of liver and intrahepatic bile duct: Secondary | ICD-10-CM | POA: Diagnosis not present

## 2022-07-25 DIAGNOSIS — I10 Essential (primary) hypertension: Secondary | ICD-10-CM | POA: Diagnosis not present

## 2022-07-25 DIAGNOSIS — C7951 Secondary malignant neoplasm of bone: Secondary | ICD-10-CM | POA: Diagnosis not present

## 2022-07-25 DIAGNOSIS — C259 Malignant neoplasm of pancreas, unspecified: Secondary | ICD-10-CM | POA: Diagnosis not present

## 2022-07-25 DIAGNOSIS — G4733 Obstructive sleep apnea (adult) (pediatric): Secondary | ICD-10-CM | POA: Diagnosis not present

## 2022-07-25 DIAGNOSIS — R634 Abnormal weight loss: Secondary | ICD-10-CM | POA: Diagnosis not present

## 2022-07-25 DIAGNOSIS — G893 Neoplasm related pain (acute) (chronic): Secondary | ICD-10-CM | POA: Diagnosis not present

## 2022-07-25 DIAGNOSIS — F32A Depression, unspecified: Secondary | ICD-10-CM | POA: Diagnosis not present

## 2022-07-25 DIAGNOSIS — K219 Gastro-esophageal reflux disease without esophagitis: Secondary | ICD-10-CM | POA: Diagnosis not present

## 2022-07-25 DIAGNOSIS — E785 Hyperlipidemia, unspecified: Secondary | ICD-10-CM | POA: Diagnosis not present

## 2022-07-26 DIAGNOSIS — I10 Essential (primary) hypertension: Secondary | ICD-10-CM | POA: Diagnosis not present

## 2022-07-26 DIAGNOSIS — C259 Malignant neoplasm of pancreas, unspecified: Secondary | ICD-10-CM | POA: Diagnosis not present

## 2022-07-26 DIAGNOSIS — F32A Depression, unspecified: Secondary | ICD-10-CM | POA: Diagnosis not present

## 2022-07-26 DIAGNOSIS — R634 Abnormal weight loss: Secondary | ICD-10-CM | POA: Diagnosis not present

## 2022-07-26 DIAGNOSIS — G4733 Obstructive sleep apnea (adult) (pediatric): Secondary | ICD-10-CM | POA: Diagnosis not present

## 2022-07-26 DIAGNOSIS — C787 Secondary malignant neoplasm of liver and intrahepatic bile duct: Secondary | ICD-10-CM | POA: Diagnosis not present

## 2022-07-26 DIAGNOSIS — K219 Gastro-esophageal reflux disease without esophagitis: Secondary | ICD-10-CM | POA: Diagnosis not present

## 2022-07-26 DIAGNOSIS — C7951 Secondary malignant neoplasm of bone: Secondary | ICD-10-CM | POA: Diagnosis not present

## 2022-07-26 DIAGNOSIS — E785 Hyperlipidemia, unspecified: Secondary | ICD-10-CM | POA: Diagnosis not present

## 2022-07-26 DIAGNOSIS — G893 Neoplasm related pain (acute) (chronic): Secondary | ICD-10-CM | POA: Diagnosis not present

## 2022-07-27 DIAGNOSIS — C7951 Secondary malignant neoplasm of bone: Secondary | ICD-10-CM | POA: Diagnosis not present

## 2022-07-27 DIAGNOSIS — E785 Hyperlipidemia, unspecified: Secondary | ICD-10-CM | POA: Diagnosis not present

## 2022-07-27 DIAGNOSIS — I10 Essential (primary) hypertension: Secondary | ICD-10-CM | POA: Diagnosis not present

## 2022-07-27 DIAGNOSIS — K219 Gastro-esophageal reflux disease without esophagitis: Secondary | ICD-10-CM | POA: Diagnosis not present

## 2022-07-27 DIAGNOSIS — C787 Secondary malignant neoplasm of liver and intrahepatic bile duct: Secondary | ICD-10-CM | POA: Diagnosis not present

## 2022-07-27 DIAGNOSIS — C259 Malignant neoplasm of pancreas, unspecified: Secondary | ICD-10-CM | POA: Diagnosis not present

## 2022-07-27 DIAGNOSIS — R634 Abnormal weight loss: Secondary | ICD-10-CM | POA: Diagnosis not present

## 2022-07-27 DIAGNOSIS — G4733 Obstructive sleep apnea (adult) (pediatric): Secondary | ICD-10-CM | POA: Diagnosis not present

## 2022-07-27 DIAGNOSIS — F32A Depression, unspecified: Secondary | ICD-10-CM | POA: Diagnosis not present

## 2022-07-27 DIAGNOSIS — G893 Neoplasm related pain (acute) (chronic): Secondary | ICD-10-CM | POA: Diagnosis not present

## 2022-07-28 DIAGNOSIS — G893 Neoplasm related pain (acute) (chronic): Secondary | ICD-10-CM | POA: Diagnosis not present

## 2022-07-28 DIAGNOSIS — I10 Essential (primary) hypertension: Secondary | ICD-10-CM | POA: Diagnosis not present

## 2022-07-28 DIAGNOSIS — E785 Hyperlipidemia, unspecified: Secondary | ICD-10-CM | POA: Diagnosis not present

## 2022-07-28 DIAGNOSIS — C7951 Secondary malignant neoplasm of bone: Secondary | ICD-10-CM | POA: Diagnosis not present

## 2022-07-28 DIAGNOSIS — F32A Depression, unspecified: Secondary | ICD-10-CM | POA: Diagnosis not present

## 2022-07-28 DIAGNOSIS — R634 Abnormal weight loss: Secondary | ICD-10-CM | POA: Diagnosis not present

## 2022-07-28 DIAGNOSIS — K219 Gastro-esophageal reflux disease without esophagitis: Secondary | ICD-10-CM | POA: Diagnosis not present

## 2022-07-28 DIAGNOSIS — G4733 Obstructive sleep apnea (adult) (pediatric): Secondary | ICD-10-CM | POA: Diagnosis not present

## 2022-07-28 DIAGNOSIS — C259 Malignant neoplasm of pancreas, unspecified: Secondary | ICD-10-CM | POA: Diagnosis not present

## 2022-07-28 DIAGNOSIS — C787 Secondary malignant neoplasm of liver and intrahepatic bile duct: Secondary | ICD-10-CM | POA: Diagnosis not present

## 2022-07-29 ENCOUNTER — Other Ambulatory Visit: Payer: Self-pay | Admitting: Hematology & Oncology

## 2022-07-29 DIAGNOSIS — G4733 Obstructive sleep apnea (adult) (pediatric): Secondary | ICD-10-CM | POA: Diagnosis not present

## 2022-07-29 DIAGNOSIS — R634 Abnormal weight loss: Secondary | ICD-10-CM | POA: Diagnosis not present

## 2022-07-29 DIAGNOSIS — C7951 Secondary malignant neoplasm of bone: Secondary | ICD-10-CM | POA: Diagnosis not present

## 2022-07-29 DIAGNOSIS — G893 Neoplasm related pain (acute) (chronic): Secondary | ICD-10-CM | POA: Diagnosis not present

## 2022-07-29 DIAGNOSIS — K219 Gastro-esophageal reflux disease without esophagitis: Secondary | ICD-10-CM | POA: Diagnosis not present

## 2022-07-29 DIAGNOSIS — C259 Malignant neoplasm of pancreas, unspecified: Secondary | ICD-10-CM | POA: Diagnosis not present

## 2022-07-29 DIAGNOSIS — E785 Hyperlipidemia, unspecified: Secondary | ICD-10-CM | POA: Diagnosis not present

## 2022-07-29 DIAGNOSIS — C787 Secondary malignant neoplasm of liver and intrahepatic bile duct: Secondary | ICD-10-CM | POA: Diagnosis not present

## 2022-07-29 DIAGNOSIS — F32A Depression, unspecified: Secondary | ICD-10-CM | POA: Diagnosis not present

## 2022-07-29 DIAGNOSIS — I10 Essential (primary) hypertension: Secondary | ICD-10-CM | POA: Diagnosis not present

## 2022-07-30 DIAGNOSIS — I10 Essential (primary) hypertension: Secondary | ICD-10-CM | POA: Diagnosis not present

## 2022-07-30 DIAGNOSIS — C7951 Secondary malignant neoplasm of bone: Secondary | ICD-10-CM | POA: Diagnosis not present

## 2022-07-30 DIAGNOSIS — C259 Malignant neoplasm of pancreas, unspecified: Secondary | ICD-10-CM | POA: Diagnosis not present

## 2022-07-30 DIAGNOSIS — F32A Depression, unspecified: Secondary | ICD-10-CM | POA: Diagnosis not present

## 2022-07-30 DIAGNOSIS — G4733 Obstructive sleep apnea (adult) (pediatric): Secondary | ICD-10-CM | POA: Diagnosis not present

## 2022-07-30 DIAGNOSIS — C787 Secondary malignant neoplasm of liver and intrahepatic bile duct: Secondary | ICD-10-CM | POA: Diagnosis not present

## 2022-07-30 DIAGNOSIS — E785 Hyperlipidemia, unspecified: Secondary | ICD-10-CM | POA: Diagnosis not present

## 2022-07-30 DIAGNOSIS — R634 Abnormal weight loss: Secondary | ICD-10-CM | POA: Diagnosis not present

## 2022-07-30 DIAGNOSIS — K219 Gastro-esophageal reflux disease without esophagitis: Secondary | ICD-10-CM | POA: Diagnosis not present

## 2022-07-30 DIAGNOSIS — G893 Neoplasm related pain (acute) (chronic): Secondary | ICD-10-CM | POA: Diagnosis not present

## 2022-07-31 DIAGNOSIS — C787 Secondary malignant neoplasm of liver and intrahepatic bile duct: Secondary | ICD-10-CM | POA: Diagnosis not present

## 2022-07-31 DIAGNOSIS — G4733 Obstructive sleep apnea (adult) (pediatric): Secondary | ICD-10-CM | POA: Diagnosis not present

## 2022-07-31 DIAGNOSIS — R634 Abnormal weight loss: Secondary | ICD-10-CM | POA: Diagnosis not present

## 2022-07-31 DIAGNOSIS — C259 Malignant neoplasm of pancreas, unspecified: Secondary | ICD-10-CM | POA: Diagnosis not present

## 2022-07-31 DIAGNOSIS — F32A Depression, unspecified: Secondary | ICD-10-CM | POA: Diagnosis not present

## 2022-07-31 DIAGNOSIS — C7951 Secondary malignant neoplasm of bone: Secondary | ICD-10-CM | POA: Diagnosis not present

## 2022-07-31 DIAGNOSIS — G893 Neoplasm related pain (acute) (chronic): Secondary | ICD-10-CM | POA: Diagnosis not present

## 2022-07-31 DIAGNOSIS — K219 Gastro-esophageal reflux disease without esophagitis: Secondary | ICD-10-CM | POA: Diagnosis not present

## 2022-07-31 DIAGNOSIS — E785 Hyperlipidemia, unspecified: Secondary | ICD-10-CM | POA: Diagnosis not present

## 2022-07-31 DIAGNOSIS — I10 Essential (primary) hypertension: Secondary | ICD-10-CM | POA: Diagnosis not present

## 2022-08-01 ENCOUNTER — Other Ambulatory Visit: Payer: Self-pay | Admitting: Hematology & Oncology

## 2022-08-01 ENCOUNTER — Other Ambulatory Visit (HOSPITAL_BASED_OUTPATIENT_CLINIC_OR_DEPARTMENT_OTHER): Payer: Self-pay

## 2022-08-01 DIAGNOSIS — E785 Hyperlipidemia, unspecified: Secondary | ICD-10-CM | POA: Diagnosis not present

## 2022-08-01 DIAGNOSIS — I10 Essential (primary) hypertension: Secondary | ICD-10-CM | POA: Diagnosis not present

## 2022-08-01 DIAGNOSIS — C787 Secondary malignant neoplasm of liver and intrahepatic bile duct: Secondary | ICD-10-CM | POA: Diagnosis not present

## 2022-08-01 DIAGNOSIS — R634 Abnormal weight loss: Secondary | ICD-10-CM | POA: Diagnosis not present

## 2022-08-01 DIAGNOSIS — G4733 Obstructive sleep apnea (adult) (pediatric): Secondary | ICD-10-CM | POA: Diagnosis not present

## 2022-08-01 DIAGNOSIS — G893 Neoplasm related pain (acute) (chronic): Secondary | ICD-10-CM | POA: Diagnosis not present

## 2022-08-01 DIAGNOSIS — K219 Gastro-esophageal reflux disease without esophagitis: Secondary | ICD-10-CM | POA: Diagnosis not present

## 2022-08-01 DIAGNOSIS — C259 Malignant neoplasm of pancreas, unspecified: Secondary | ICD-10-CM | POA: Diagnosis not present

## 2022-08-01 DIAGNOSIS — C7951 Secondary malignant neoplasm of bone: Secondary | ICD-10-CM | POA: Diagnosis not present

## 2022-08-01 DIAGNOSIS — C252 Malignant neoplasm of tail of pancreas: Secondary | ICD-10-CM

## 2022-08-01 DIAGNOSIS — F32A Depression, unspecified: Secondary | ICD-10-CM | POA: Diagnosis not present

## 2022-08-01 MED ORDER — HYDROMORPHONE HCL 2 MG PO TABS
2.0000 mg | ORAL_TABLET | ORAL | 0 refills | Status: DC | PRN
  Filled 2022-08-01: qty 90, 15d supply, fill #0

## 2022-08-02 ENCOUNTER — Other Ambulatory Visit (HOSPITAL_BASED_OUTPATIENT_CLINIC_OR_DEPARTMENT_OTHER): Payer: Self-pay

## 2022-08-02 DIAGNOSIS — R634 Abnormal weight loss: Secondary | ICD-10-CM | POA: Diagnosis not present

## 2022-08-02 DIAGNOSIS — C787 Secondary malignant neoplasm of liver and intrahepatic bile duct: Secondary | ICD-10-CM | POA: Diagnosis not present

## 2022-08-02 DIAGNOSIS — E785 Hyperlipidemia, unspecified: Secondary | ICD-10-CM | POA: Diagnosis not present

## 2022-08-02 DIAGNOSIS — C259 Malignant neoplasm of pancreas, unspecified: Secondary | ICD-10-CM | POA: Diagnosis not present

## 2022-08-02 DIAGNOSIS — G893 Neoplasm related pain (acute) (chronic): Secondary | ICD-10-CM | POA: Diagnosis not present

## 2022-08-02 DIAGNOSIS — F32A Depression, unspecified: Secondary | ICD-10-CM | POA: Diagnosis not present

## 2022-08-02 DIAGNOSIS — K219 Gastro-esophageal reflux disease without esophagitis: Secondary | ICD-10-CM | POA: Diagnosis not present

## 2022-08-02 DIAGNOSIS — I10 Essential (primary) hypertension: Secondary | ICD-10-CM | POA: Diagnosis not present

## 2022-08-02 DIAGNOSIS — C7951 Secondary malignant neoplasm of bone: Secondary | ICD-10-CM | POA: Diagnosis not present

## 2022-08-02 DIAGNOSIS — G4733 Obstructive sleep apnea (adult) (pediatric): Secondary | ICD-10-CM | POA: Diagnosis not present

## 2022-08-03 DIAGNOSIS — E785 Hyperlipidemia, unspecified: Secondary | ICD-10-CM | POA: Diagnosis not present

## 2022-08-03 DIAGNOSIS — K219 Gastro-esophageal reflux disease without esophagitis: Secondary | ICD-10-CM | POA: Diagnosis not present

## 2022-08-03 DIAGNOSIS — G893 Neoplasm related pain (acute) (chronic): Secondary | ICD-10-CM | POA: Diagnosis not present

## 2022-08-03 DIAGNOSIS — G4733 Obstructive sleep apnea (adult) (pediatric): Secondary | ICD-10-CM | POA: Diagnosis not present

## 2022-08-03 DIAGNOSIS — R634 Abnormal weight loss: Secondary | ICD-10-CM | POA: Diagnosis not present

## 2022-08-03 DIAGNOSIS — I10 Essential (primary) hypertension: Secondary | ICD-10-CM | POA: Diagnosis not present

## 2022-08-03 DIAGNOSIS — C7951 Secondary malignant neoplasm of bone: Secondary | ICD-10-CM | POA: Diagnosis not present

## 2022-08-03 DIAGNOSIS — C787 Secondary malignant neoplasm of liver and intrahepatic bile duct: Secondary | ICD-10-CM | POA: Diagnosis not present

## 2022-08-03 DIAGNOSIS — C259 Malignant neoplasm of pancreas, unspecified: Secondary | ICD-10-CM | POA: Diagnosis not present

## 2022-08-03 DIAGNOSIS — F32A Depression, unspecified: Secondary | ICD-10-CM | POA: Diagnosis not present

## 2022-08-04 ENCOUNTER — Other Ambulatory Visit: Payer: Self-pay | Admitting: Family

## 2022-08-04 DIAGNOSIS — E876 Hypokalemia: Secondary | ICD-10-CM

## 2022-08-04 DIAGNOSIS — K219 Gastro-esophageal reflux disease without esophagitis: Secondary | ICD-10-CM | POA: Diagnosis not present

## 2022-08-04 DIAGNOSIS — G893 Neoplasm related pain (acute) (chronic): Secondary | ICD-10-CM | POA: Diagnosis not present

## 2022-08-04 DIAGNOSIS — F32A Depression, unspecified: Secondary | ICD-10-CM | POA: Diagnosis not present

## 2022-08-04 DIAGNOSIS — E785 Hyperlipidemia, unspecified: Secondary | ICD-10-CM | POA: Diagnosis not present

## 2022-08-04 DIAGNOSIS — C787 Secondary malignant neoplasm of liver and intrahepatic bile duct: Secondary | ICD-10-CM | POA: Diagnosis not present

## 2022-08-04 DIAGNOSIS — G4733 Obstructive sleep apnea (adult) (pediatric): Secondary | ICD-10-CM | POA: Diagnosis not present

## 2022-08-04 DIAGNOSIS — R634 Abnormal weight loss: Secondary | ICD-10-CM | POA: Diagnosis not present

## 2022-08-04 DIAGNOSIS — C259 Malignant neoplasm of pancreas, unspecified: Secondary | ICD-10-CM | POA: Diagnosis not present

## 2022-08-04 DIAGNOSIS — C7951 Secondary malignant neoplasm of bone: Secondary | ICD-10-CM | POA: Diagnosis not present

## 2022-08-04 DIAGNOSIS — I10 Essential (primary) hypertension: Secondary | ICD-10-CM | POA: Diagnosis not present

## 2022-08-05 ENCOUNTER — Other Ambulatory Visit: Payer: Self-pay | Admitting: Family Medicine

## 2022-08-05 DIAGNOSIS — C259 Malignant neoplasm of pancreas, unspecified: Secondary | ICD-10-CM | POA: Diagnosis not present

## 2022-08-05 DIAGNOSIS — G893 Neoplasm related pain (acute) (chronic): Secondary | ICD-10-CM | POA: Diagnosis not present

## 2022-08-05 DIAGNOSIS — R634 Abnormal weight loss: Secondary | ICD-10-CM | POA: Diagnosis not present

## 2022-08-05 DIAGNOSIS — I10 Essential (primary) hypertension: Secondary | ICD-10-CM | POA: Diagnosis not present

## 2022-08-05 DIAGNOSIS — G4733 Obstructive sleep apnea (adult) (pediatric): Secondary | ICD-10-CM | POA: Diagnosis not present

## 2022-08-05 DIAGNOSIS — C787 Secondary malignant neoplasm of liver and intrahepatic bile duct: Secondary | ICD-10-CM | POA: Diagnosis not present

## 2022-08-05 DIAGNOSIS — K219 Gastro-esophageal reflux disease without esophagitis: Secondary | ICD-10-CM | POA: Diagnosis not present

## 2022-08-05 DIAGNOSIS — F32A Depression, unspecified: Secondary | ICD-10-CM | POA: Diagnosis not present

## 2022-08-05 DIAGNOSIS — E785 Hyperlipidemia, unspecified: Secondary | ICD-10-CM | POA: Diagnosis not present

## 2022-08-05 DIAGNOSIS — C7951 Secondary malignant neoplasm of bone: Secondary | ICD-10-CM | POA: Diagnosis not present

## 2022-08-06 DIAGNOSIS — R634 Abnormal weight loss: Secondary | ICD-10-CM | POA: Diagnosis not present

## 2022-08-06 DIAGNOSIS — G893 Neoplasm related pain (acute) (chronic): Secondary | ICD-10-CM | POA: Diagnosis not present

## 2022-08-06 DIAGNOSIS — C7951 Secondary malignant neoplasm of bone: Secondary | ICD-10-CM | POA: Diagnosis not present

## 2022-08-06 DIAGNOSIS — C787 Secondary malignant neoplasm of liver and intrahepatic bile duct: Secondary | ICD-10-CM | POA: Diagnosis not present

## 2022-08-06 DIAGNOSIS — F32A Depression, unspecified: Secondary | ICD-10-CM | POA: Diagnosis not present

## 2022-08-06 DIAGNOSIS — C259 Malignant neoplasm of pancreas, unspecified: Secondary | ICD-10-CM | POA: Diagnosis not present

## 2022-08-06 DIAGNOSIS — I10 Essential (primary) hypertension: Secondary | ICD-10-CM | POA: Diagnosis not present

## 2022-08-06 DIAGNOSIS — E785 Hyperlipidemia, unspecified: Secondary | ICD-10-CM | POA: Diagnosis not present

## 2022-08-06 DIAGNOSIS — K219 Gastro-esophageal reflux disease without esophagitis: Secondary | ICD-10-CM | POA: Diagnosis not present

## 2022-08-06 DIAGNOSIS — G4733 Obstructive sleep apnea (adult) (pediatric): Secondary | ICD-10-CM | POA: Diagnosis not present

## 2022-08-07 DIAGNOSIS — G4733 Obstructive sleep apnea (adult) (pediatric): Secondary | ICD-10-CM | POA: Diagnosis not present

## 2022-08-07 DIAGNOSIS — K219 Gastro-esophageal reflux disease without esophagitis: Secondary | ICD-10-CM | POA: Diagnosis not present

## 2022-08-07 DIAGNOSIS — G893 Neoplasm related pain (acute) (chronic): Secondary | ICD-10-CM | POA: Diagnosis not present

## 2022-08-07 DIAGNOSIS — C787 Secondary malignant neoplasm of liver and intrahepatic bile duct: Secondary | ICD-10-CM | POA: Diagnosis not present

## 2022-08-07 DIAGNOSIS — F32A Depression, unspecified: Secondary | ICD-10-CM | POA: Diagnosis not present

## 2022-08-07 DIAGNOSIS — I10 Essential (primary) hypertension: Secondary | ICD-10-CM | POA: Diagnosis not present

## 2022-08-07 DIAGNOSIS — C7951 Secondary malignant neoplasm of bone: Secondary | ICD-10-CM | POA: Diagnosis not present

## 2022-08-07 DIAGNOSIS — C259 Malignant neoplasm of pancreas, unspecified: Secondary | ICD-10-CM | POA: Diagnosis not present

## 2022-08-07 DIAGNOSIS — E785 Hyperlipidemia, unspecified: Secondary | ICD-10-CM | POA: Diagnosis not present

## 2022-08-07 DIAGNOSIS — R634 Abnormal weight loss: Secondary | ICD-10-CM | POA: Diagnosis not present

## 2022-08-08 DIAGNOSIS — C259 Malignant neoplasm of pancreas, unspecified: Secondary | ICD-10-CM | POA: Diagnosis not present

## 2022-08-08 DIAGNOSIS — F32A Depression, unspecified: Secondary | ICD-10-CM | POA: Diagnosis not present

## 2022-08-08 DIAGNOSIS — C787 Secondary malignant neoplasm of liver and intrahepatic bile duct: Secondary | ICD-10-CM | POA: Diagnosis not present

## 2022-08-08 DIAGNOSIS — G893 Neoplasm related pain (acute) (chronic): Secondary | ICD-10-CM | POA: Diagnosis not present

## 2022-08-08 DIAGNOSIS — K219 Gastro-esophageal reflux disease without esophagitis: Secondary | ICD-10-CM | POA: Diagnosis not present

## 2022-08-08 DIAGNOSIS — R634 Abnormal weight loss: Secondary | ICD-10-CM | POA: Diagnosis not present

## 2022-08-08 DIAGNOSIS — E785 Hyperlipidemia, unspecified: Secondary | ICD-10-CM | POA: Diagnosis not present

## 2022-08-08 DIAGNOSIS — G4733 Obstructive sleep apnea (adult) (pediatric): Secondary | ICD-10-CM | POA: Diagnosis not present

## 2022-08-08 DIAGNOSIS — I10 Essential (primary) hypertension: Secondary | ICD-10-CM | POA: Diagnosis not present

## 2022-08-08 DIAGNOSIS — C7951 Secondary malignant neoplasm of bone: Secondary | ICD-10-CM | POA: Diagnosis not present

## 2022-08-09 DIAGNOSIS — F32A Depression, unspecified: Secondary | ICD-10-CM | POA: Diagnosis not present

## 2022-08-09 DIAGNOSIS — E785 Hyperlipidemia, unspecified: Secondary | ICD-10-CM | POA: Diagnosis not present

## 2022-08-09 DIAGNOSIS — G893 Neoplasm related pain (acute) (chronic): Secondary | ICD-10-CM | POA: Diagnosis not present

## 2022-08-09 DIAGNOSIS — G4733 Obstructive sleep apnea (adult) (pediatric): Secondary | ICD-10-CM | POA: Diagnosis not present

## 2022-08-09 DIAGNOSIS — I10 Essential (primary) hypertension: Secondary | ICD-10-CM | POA: Diagnosis not present

## 2022-08-09 DIAGNOSIS — C7951 Secondary malignant neoplasm of bone: Secondary | ICD-10-CM | POA: Diagnosis not present

## 2022-08-09 DIAGNOSIS — C787 Secondary malignant neoplasm of liver and intrahepatic bile duct: Secondary | ICD-10-CM | POA: Diagnosis not present

## 2022-08-09 DIAGNOSIS — K219 Gastro-esophageal reflux disease without esophagitis: Secondary | ICD-10-CM | POA: Diagnosis not present

## 2022-08-09 DIAGNOSIS — R634 Abnormal weight loss: Secondary | ICD-10-CM | POA: Diagnosis not present

## 2022-08-09 DIAGNOSIS — C259 Malignant neoplasm of pancreas, unspecified: Secondary | ICD-10-CM | POA: Diagnosis not present

## 2022-08-10 DIAGNOSIS — G893 Neoplasm related pain (acute) (chronic): Secondary | ICD-10-CM | POA: Diagnosis not present

## 2022-08-10 DIAGNOSIS — C7951 Secondary malignant neoplasm of bone: Secondary | ICD-10-CM | POA: Diagnosis not present

## 2022-08-10 DIAGNOSIS — F32A Depression, unspecified: Secondary | ICD-10-CM | POA: Diagnosis not present

## 2022-08-10 DIAGNOSIS — K219 Gastro-esophageal reflux disease without esophagitis: Secondary | ICD-10-CM | POA: Diagnosis not present

## 2022-08-10 DIAGNOSIS — G4733 Obstructive sleep apnea (adult) (pediatric): Secondary | ICD-10-CM | POA: Diagnosis not present

## 2022-08-10 DIAGNOSIS — C787 Secondary malignant neoplasm of liver and intrahepatic bile duct: Secondary | ICD-10-CM | POA: Diagnosis not present

## 2022-08-10 DIAGNOSIS — C259 Malignant neoplasm of pancreas, unspecified: Secondary | ICD-10-CM | POA: Diagnosis not present

## 2022-08-10 DIAGNOSIS — R634 Abnormal weight loss: Secondary | ICD-10-CM | POA: Diagnosis not present

## 2022-08-10 DIAGNOSIS — I10 Essential (primary) hypertension: Secondary | ICD-10-CM | POA: Diagnosis not present

## 2022-08-10 DIAGNOSIS — E785 Hyperlipidemia, unspecified: Secondary | ICD-10-CM | POA: Diagnosis not present

## 2022-08-10 NOTE — Progress Notes (Signed)
                                                                                                                                                             Patient Name: Makayla Good MRN: [id number] DOB: May 25, 1970 Referring Physician: Loreen Freud (Profile Not Attached) Date of Service: 07/13/2022 [facility], [place]                                                        End Of Treatment Note  Diagnoses: C79.51-Secondary malignant neoplasm of bone  Cancer Staging: 52 y.o. woman with stage IV pancreatic cancer with painful metastasis to the sacrum   Intent: Palliative  Radiation Treatment Dates: 07/09/2022 through 07/13/2022 Site Technique Total Dose (Gy) Dose per Fx (Gy) Completed Fx Beam Energies  Sacrum: Spine_Rt_S2 3D 20/20 4 5/5 10X, 15X   Narrative: The patient tolerated radiation therapy relatively well with only modest fatigue.  Plan: The patient will receive a call in about one month from the radiation oncology department. She will continue follow up with her medical oncologist, Dr. Myna Hidalgo, as well.  ------------------------------------------------   Margaretmary Dys, MD [facility]  Radiation Oncology Direct Dial: [phone]  Fax: [phone] [email or web address]  Skype  LinkedIn

## 2022-08-11 DIAGNOSIS — R634 Abnormal weight loss: Secondary | ICD-10-CM | POA: Diagnosis not present

## 2022-08-11 DIAGNOSIS — K219 Gastro-esophageal reflux disease without esophagitis: Secondary | ICD-10-CM | POA: Diagnosis not present

## 2022-08-11 DIAGNOSIS — I10 Essential (primary) hypertension: Secondary | ICD-10-CM | POA: Diagnosis not present

## 2022-08-11 DIAGNOSIS — F32A Depression, unspecified: Secondary | ICD-10-CM | POA: Diagnosis not present

## 2022-08-11 DIAGNOSIS — G4733 Obstructive sleep apnea (adult) (pediatric): Secondary | ICD-10-CM | POA: Diagnosis not present

## 2022-08-11 DIAGNOSIS — E785 Hyperlipidemia, unspecified: Secondary | ICD-10-CM | POA: Diagnosis not present

## 2022-08-11 DIAGNOSIS — G893 Neoplasm related pain (acute) (chronic): Secondary | ICD-10-CM | POA: Diagnosis not present

## 2022-08-11 DIAGNOSIS — C787 Secondary malignant neoplasm of liver and intrahepatic bile duct: Secondary | ICD-10-CM | POA: Diagnosis not present

## 2022-08-11 DIAGNOSIS — C259 Malignant neoplasm of pancreas, unspecified: Secondary | ICD-10-CM | POA: Diagnosis not present

## 2022-08-11 DIAGNOSIS — C7951 Secondary malignant neoplasm of bone: Secondary | ICD-10-CM | POA: Diagnosis not present

## 2022-08-12 DIAGNOSIS — K219 Gastro-esophageal reflux disease without esophagitis: Secondary | ICD-10-CM | POA: Diagnosis not present

## 2022-08-12 DIAGNOSIS — G893 Neoplasm related pain (acute) (chronic): Secondary | ICD-10-CM | POA: Diagnosis not present

## 2022-08-12 DIAGNOSIS — R634 Abnormal weight loss: Secondary | ICD-10-CM | POA: Diagnosis not present

## 2022-08-12 DIAGNOSIS — C787 Secondary malignant neoplasm of liver and intrahepatic bile duct: Secondary | ICD-10-CM | POA: Diagnosis not present

## 2022-08-12 DIAGNOSIS — G4733 Obstructive sleep apnea (adult) (pediatric): Secondary | ICD-10-CM | POA: Diagnosis not present

## 2022-08-12 DIAGNOSIS — E785 Hyperlipidemia, unspecified: Secondary | ICD-10-CM | POA: Diagnosis not present

## 2022-08-12 DIAGNOSIS — I10 Essential (primary) hypertension: Secondary | ICD-10-CM | POA: Diagnosis not present

## 2022-08-12 DIAGNOSIS — C7951 Secondary malignant neoplasm of bone: Secondary | ICD-10-CM | POA: Diagnosis not present

## 2022-08-12 DIAGNOSIS — C259 Malignant neoplasm of pancreas, unspecified: Secondary | ICD-10-CM | POA: Diagnosis not present

## 2022-08-12 DIAGNOSIS — F32A Depression, unspecified: Secondary | ICD-10-CM | POA: Diagnosis not present

## 2022-08-13 ENCOUNTER — Inpatient Hospital Stay: Payer: BC Managed Care – PPO

## 2022-08-13 ENCOUNTER — Inpatient Hospital Stay: Payer: BC Managed Care – PPO | Admitting: Hematology & Oncology

## 2022-08-13 DIAGNOSIS — C259 Malignant neoplasm of pancreas, unspecified: Secondary | ICD-10-CM | POA: Diagnosis not present

## 2022-08-13 DIAGNOSIS — G893 Neoplasm related pain (acute) (chronic): Secondary | ICD-10-CM | POA: Diagnosis not present

## 2022-08-13 DIAGNOSIS — K219 Gastro-esophageal reflux disease without esophagitis: Secondary | ICD-10-CM | POA: Diagnosis not present

## 2022-08-13 DIAGNOSIS — C7951 Secondary malignant neoplasm of bone: Secondary | ICD-10-CM | POA: Diagnosis not present

## 2022-08-13 DIAGNOSIS — I10 Essential (primary) hypertension: Secondary | ICD-10-CM | POA: Diagnosis not present

## 2022-08-13 DIAGNOSIS — F32A Depression, unspecified: Secondary | ICD-10-CM | POA: Diagnosis not present

## 2022-08-13 DIAGNOSIS — E785 Hyperlipidemia, unspecified: Secondary | ICD-10-CM | POA: Diagnosis not present

## 2022-08-13 DIAGNOSIS — G4733 Obstructive sleep apnea (adult) (pediatric): Secondary | ICD-10-CM | POA: Diagnosis not present

## 2022-08-13 DIAGNOSIS — C787 Secondary malignant neoplasm of liver and intrahepatic bile duct: Secondary | ICD-10-CM | POA: Diagnosis not present

## 2022-08-13 DIAGNOSIS — R634 Abnormal weight loss: Secondary | ICD-10-CM | POA: Diagnosis not present

## 2022-08-13 NOTE — Progress Notes (Signed)
[employer] papers completed and faxed with confirmation received.

## 2022-08-14 ENCOUNTER — Ambulatory Visit
Admission: RE | Admit: 2022-08-14 | Discharge: 2022-08-14 | Disposition: A | Discharge status: Home / Self Care | Payer: BC Managed Care – PPO | Source: Ambulatory Visit | Attending: Radiation Oncology | Admitting: Radiation Oncology

## 2022-08-14 DIAGNOSIS — K219 Gastro-esophageal reflux disease without esophagitis: Secondary | ICD-10-CM | POA: Diagnosis not present

## 2022-08-14 DIAGNOSIS — I10 Essential (primary) hypertension: Secondary | ICD-10-CM | POA: Diagnosis not present

## 2022-08-14 DIAGNOSIS — R634 Abnormal weight loss: Secondary | ICD-10-CM | POA: Diagnosis not present

## 2022-08-14 DIAGNOSIS — E785 Hyperlipidemia, unspecified: Secondary | ICD-10-CM | POA: Diagnosis not present

## 2022-08-14 DIAGNOSIS — G4733 Obstructive sleep apnea (adult) (pediatric): Secondary | ICD-10-CM | POA: Diagnosis not present

## 2022-08-14 DIAGNOSIS — G893 Neoplasm related pain (acute) (chronic): Secondary | ICD-10-CM | POA: Diagnosis not present

## 2022-08-14 DIAGNOSIS — C259 Malignant neoplasm of pancreas, unspecified: Secondary | ICD-10-CM | POA: Diagnosis not present

## 2022-08-14 DIAGNOSIS — C7951 Secondary malignant neoplasm of bone: Secondary | ICD-10-CM | POA: Diagnosis not present

## 2022-08-14 DIAGNOSIS — C787 Secondary malignant neoplasm of liver and intrahepatic bile duct: Secondary | ICD-10-CM | POA: Diagnosis not present

## 2022-08-14 DIAGNOSIS — F32A Depression, unspecified: Secondary | ICD-10-CM | POA: Diagnosis not present

## 2022-08-14 NOTE — Progress Notes (Signed)
  Radiation Oncology         [phone] ________________________________  Name: Makayla Good MRN: [id number]  Date of Service: 08/14/2022  DOB: 12-17-1970  Post Treatment Telephone Note  Diagnosis:  52 y.o. woman with stage IV pancreatic cancer with painful metastasis to the sacrum   Intent: Palliative  Radiation Treatment Dates: 07/09/2022 through 07/13/2022 Site Technique Total Dose (Gy) Dose per Fx (Gy) Completed Fx Beam Energies  Sacrum: Spine_Rt_S2 3D 20/20 4 5/5 10X, 15X   (as documented in provider EOT note)   The patient was available for call today.  The patient did note fatigue during radiation, that's still remained moderate. The patient did note skin changes in the field of radiation during therapy. The patient has noticed improvement in pain in the area(s) treated with radiation. The patient is not taking dexamethasone. The patient does have symptoms of weakness, but no loss of control of the extremities. The patient does not have symptoms of headache. The patient does not have symptoms of seizure or uncontrolled movement. The patient does have some mildly blurry vision. The patient does not have changes in speech. The patient does not have confusion.   The patient is scheduled for ongoing care with Dr. Myna Hidalgo in medical oncology. The patient was encouraged to call if she develops concerns or questions regarding radiation.  This concludes the interview.   Ruel Favors, LPN

## 2022-08-15 ENCOUNTER — Other Ambulatory Visit (HOSPITAL_BASED_OUTPATIENT_CLINIC_OR_DEPARTMENT_OTHER): Payer: Self-pay

## 2022-08-15 DIAGNOSIS — R634 Abnormal weight loss: Secondary | ICD-10-CM | POA: Diagnosis not present

## 2022-08-15 DIAGNOSIS — I10 Essential (primary) hypertension: Secondary | ICD-10-CM | POA: Diagnosis not present

## 2022-08-15 DIAGNOSIS — C259 Malignant neoplasm of pancreas, unspecified: Secondary | ICD-10-CM | POA: Diagnosis not present

## 2022-08-15 DIAGNOSIS — K219 Gastro-esophageal reflux disease without esophagitis: Secondary | ICD-10-CM | POA: Diagnosis not present

## 2022-08-15 DIAGNOSIS — G4733 Obstructive sleep apnea (adult) (pediatric): Secondary | ICD-10-CM | POA: Diagnosis not present

## 2022-08-15 DIAGNOSIS — E785 Hyperlipidemia, unspecified: Secondary | ICD-10-CM | POA: Diagnosis not present

## 2022-08-15 DIAGNOSIS — F32A Depression, unspecified: Secondary | ICD-10-CM | POA: Diagnosis not present

## 2022-08-15 DIAGNOSIS — G893 Neoplasm related pain (acute) (chronic): Secondary | ICD-10-CM | POA: Diagnosis not present

## 2022-08-15 DIAGNOSIS — C787 Secondary malignant neoplasm of liver and intrahepatic bile duct: Secondary | ICD-10-CM | POA: Diagnosis not present

## 2022-08-15 DIAGNOSIS — C7951 Secondary malignant neoplasm of bone: Secondary | ICD-10-CM | POA: Diagnosis not present

## 2022-08-15 MED ORDER — HYDROMORPHONE HCL 2 MG PO TABS
2.0000 mg | ORAL_TABLET | ORAL | 0 refills | Status: DC | PRN
  Filled 2022-08-15: qty 90, 8d supply, fill #0

## 2022-08-15 MED ORDER — CYCLOBENZAPRINE HCL 10 MG PO TABS
10.0000 mg | ORAL_TABLET | Freq: Every evening | ORAL | 0 refills | Status: DC | PRN
  Filled 2022-08-15: qty 30, 30d supply, fill #0

## 2022-08-15 MED ORDER — METHADONE HCL 5 MG PO TABS: 7.5000 mg | ORAL_TABLET | Freq: Three times a day (TID) | ORAL | 0 refills | Status: DC

## 2022-08-15 MED ORDER — LORAZEPAM 0.5 MG PO TABS
0.5000 mg | ORAL_TABLET | Freq: Three times a day (TID) | ORAL | 0 refills | Status: DC | PRN
  Filled 2022-08-15: qty 30, 10d supply, fill #0

## 2022-08-16 ENCOUNTER — Other Ambulatory Visit (HOSPITAL_BASED_OUTPATIENT_CLINIC_OR_DEPARTMENT_OTHER): Payer: Self-pay

## 2022-08-16 DIAGNOSIS — G893 Neoplasm related pain (acute) (chronic): Secondary | ICD-10-CM | POA: Diagnosis not present

## 2022-08-16 DIAGNOSIS — C787 Secondary malignant neoplasm of liver and intrahepatic bile duct: Secondary | ICD-10-CM | POA: Diagnosis not present

## 2022-08-16 DIAGNOSIS — I10 Essential (primary) hypertension: Secondary | ICD-10-CM | POA: Diagnosis not present

## 2022-08-16 DIAGNOSIS — C7951 Secondary malignant neoplasm of bone: Secondary | ICD-10-CM | POA: Diagnosis not present

## 2022-08-16 DIAGNOSIS — R634 Abnormal weight loss: Secondary | ICD-10-CM | POA: Diagnosis not present

## 2022-08-16 DIAGNOSIS — C259 Malignant neoplasm of pancreas, unspecified: Secondary | ICD-10-CM | POA: Diagnosis not present

## 2022-08-16 DIAGNOSIS — F32A Depression, unspecified: Secondary | ICD-10-CM | POA: Diagnosis not present

## 2022-08-16 DIAGNOSIS — E785 Hyperlipidemia, unspecified: Secondary | ICD-10-CM | POA: Diagnosis not present

## 2022-08-16 DIAGNOSIS — K219 Gastro-esophageal reflux disease without esophagitis: Secondary | ICD-10-CM | POA: Diagnosis not present

## 2022-08-16 DIAGNOSIS — G4733 Obstructive sleep apnea (adult) (pediatric): Secondary | ICD-10-CM | POA: Diagnosis not present

## 2022-08-17 DIAGNOSIS — C787 Secondary malignant neoplasm of liver and intrahepatic bile duct: Secondary | ICD-10-CM | POA: Diagnosis not present

## 2022-08-17 DIAGNOSIS — K219 Gastro-esophageal reflux disease without esophagitis: Secondary | ICD-10-CM | POA: Diagnosis not present

## 2022-08-17 DIAGNOSIS — I10 Essential (primary) hypertension: Secondary | ICD-10-CM | POA: Diagnosis not present

## 2022-08-17 DIAGNOSIS — C7951 Secondary malignant neoplasm of bone: Secondary | ICD-10-CM | POA: Diagnosis not present

## 2022-08-17 DIAGNOSIS — G4733 Obstructive sleep apnea (adult) (pediatric): Secondary | ICD-10-CM | POA: Diagnosis not present

## 2022-08-17 DIAGNOSIS — C259 Malignant neoplasm of pancreas, unspecified: Secondary | ICD-10-CM | POA: Diagnosis not present

## 2022-08-17 DIAGNOSIS — G893 Neoplasm related pain (acute) (chronic): Secondary | ICD-10-CM | POA: Diagnosis not present

## 2022-08-17 DIAGNOSIS — R634 Abnormal weight loss: Secondary | ICD-10-CM | POA: Diagnosis not present

## 2022-08-17 DIAGNOSIS — F32A Depression, unspecified: Secondary | ICD-10-CM | POA: Diagnosis not present

## 2022-08-17 DIAGNOSIS — E785 Hyperlipidemia, unspecified: Secondary | ICD-10-CM | POA: Diagnosis not present

## 2022-08-18 DIAGNOSIS — F32A Depression, unspecified: Secondary | ICD-10-CM | POA: Diagnosis not present

## 2022-08-18 DIAGNOSIS — G893 Neoplasm related pain (acute) (chronic): Secondary | ICD-10-CM | POA: Diagnosis not present

## 2022-08-18 DIAGNOSIS — G4733 Obstructive sleep apnea (adult) (pediatric): Secondary | ICD-10-CM | POA: Diagnosis not present

## 2022-08-18 DIAGNOSIS — E785 Hyperlipidemia, unspecified: Secondary | ICD-10-CM | POA: Diagnosis not present

## 2022-08-18 DIAGNOSIS — C259 Malignant neoplasm of pancreas, unspecified: Secondary | ICD-10-CM | POA: Diagnosis not present

## 2022-08-18 DIAGNOSIS — I10 Essential (primary) hypertension: Secondary | ICD-10-CM | POA: Diagnosis not present

## 2022-08-18 DIAGNOSIS — C787 Secondary malignant neoplasm of liver and intrahepatic bile duct: Secondary | ICD-10-CM | POA: Diagnosis not present

## 2022-08-18 DIAGNOSIS — C7951 Secondary malignant neoplasm of bone: Secondary | ICD-10-CM | POA: Diagnosis not present

## 2022-08-18 DIAGNOSIS — K219 Gastro-esophageal reflux disease without esophagitis: Secondary | ICD-10-CM | POA: Diagnosis not present

## 2022-08-18 DIAGNOSIS — R634 Abnormal weight loss: Secondary | ICD-10-CM | POA: Diagnosis not present

## 2022-08-19 DIAGNOSIS — F32A Depression, unspecified: Secondary | ICD-10-CM | POA: Diagnosis not present

## 2022-08-19 DIAGNOSIS — K219 Gastro-esophageal reflux disease without esophagitis: Secondary | ICD-10-CM | POA: Diagnosis not present

## 2022-08-19 DIAGNOSIS — R634 Abnormal weight loss: Secondary | ICD-10-CM | POA: Diagnosis not present

## 2022-08-19 DIAGNOSIS — C259 Malignant neoplasm of pancreas, unspecified: Secondary | ICD-10-CM | POA: Diagnosis not present

## 2022-08-19 DIAGNOSIS — C787 Secondary malignant neoplasm of liver and intrahepatic bile duct: Secondary | ICD-10-CM | POA: Diagnosis not present

## 2022-08-19 DIAGNOSIS — I10 Essential (primary) hypertension: Secondary | ICD-10-CM | POA: Diagnosis not present

## 2022-08-19 DIAGNOSIS — G893 Neoplasm related pain (acute) (chronic): Secondary | ICD-10-CM | POA: Diagnosis not present

## 2022-08-19 DIAGNOSIS — C7951 Secondary malignant neoplasm of bone: Secondary | ICD-10-CM | POA: Diagnosis not present

## 2022-08-19 DIAGNOSIS — G4733 Obstructive sleep apnea (adult) (pediatric): Secondary | ICD-10-CM | POA: Diagnosis not present

## 2022-08-19 DIAGNOSIS — E785 Hyperlipidemia, unspecified: Secondary | ICD-10-CM | POA: Diagnosis not present

## 2022-08-20 ENCOUNTER — Telehealth: Payer: Self-pay | Admitting: *Deleted

## 2022-08-20 DIAGNOSIS — C259 Malignant neoplasm of pancreas, unspecified: Secondary | ICD-10-CM | POA: Diagnosis not present

## 2022-08-20 DIAGNOSIS — G4733 Obstructive sleep apnea (adult) (pediatric): Secondary | ICD-10-CM | POA: Diagnosis not present

## 2022-08-20 DIAGNOSIS — K219 Gastro-esophageal reflux disease without esophagitis: Secondary | ICD-10-CM | POA: Diagnosis not present

## 2022-08-20 DIAGNOSIS — E785 Hyperlipidemia, unspecified: Secondary | ICD-10-CM | POA: Diagnosis not present

## 2022-08-20 DIAGNOSIS — C787 Secondary malignant neoplasm of liver and intrahepatic bile duct: Secondary | ICD-10-CM | POA: Diagnosis not present

## 2022-08-20 DIAGNOSIS — R634 Abnormal weight loss: Secondary | ICD-10-CM | POA: Diagnosis not present

## 2022-08-20 DIAGNOSIS — G893 Neoplasm related pain (acute) (chronic): Secondary | ICD-10-CM | POA: Diagnosis not present

## 2022-08-20 DIAGNOSIS — F32A Depression, unspecified: Secondary | ICD-10-CM | POA: Diagnosis not present

## 2022-08-20 DIAGNOSIS — C7951 Secondary malignant neoplasm of bone: Secondary | ICD-10-CM | POA: Diagnosis not present

## 2022-08-20 DIAGNOSIS — I10 Essential (primary) hypertension: Secondary | ICD-10-CM | POA: Diagnosis not present

## 2022-08-20 NOTE — Telephone Encounter (Signed)
Received call from Deb at [facility] regarding patients medical accomodation form questioning the end date of intermittent absence is it 09/21/22 or 6 months as stated in paragraph and how many days can patient work from home.  Called patient at home and [facility] stating the reason for my call.  Also contacted Toniann Fail who felt patient should have FMLA papers not work accomodation papers which she had shared with patient.  Will await patients phone call with clarification

## 2022-08-21 DIAGNOSIS — C787 Secondary malignant neoplasm of liver and intrahepatic bile duct: Secondary | ICD-10-CM | POA: Diagnosis not present

## 2022-08-21 DIAGNOSIS — G4733 Obstructive sleep apnea (adult) (pediatric): Secondary | ICD-10-CM | POA: Diagnosis not present

## 2022-08-21 DIAGNOSIS — C7951 Secondary malignant neoplasm of bone: Secondary | ICD-10-CM | POA: Diagnosis not present

## 2022-08-21 DIAGNOSIS — R634 Abnormal weight loss: Secondary | ICD-10-CM | POA: Diagnosis not present

## 2022-08-21 DIAGNOSIS — K219 Gastro-esophageal reflux disease without esophagitis: Secondary | ICD-10-CM | POA: Diagnosis not present

## 2022-08-21 DIAGNOSIS — F32A Depression, unspecified: Secondary | ICD-10-CM | POA: Diagnosis not present

## 2022-08-21 DIAGNOSIS — C259 Malignant neoplasm of pancreas, unspecified: Secondary | ICD-10-CM | POA: Diagnosis not present

## 2022-08-21 DIAGNOSIS — E785 Hyperlipidemia, unspecified: Secondary | ICD-10-CM | POA: Diagnosis not present

## 2022-08-21 DIAGNOSIS — G893 Neoplasm related pain (acute) (chronic): Secondary | ICD-10-CM | POA: Diagnosis not present

## 2022-08-21 DIAGNOSIS — I10 Essential (primary) hypertension: Secondary | ICD-10-CM | POA: Diagnosis not present

## 2022-08-22 DIAGNOSIS — E785 Hyperlipidemia, unspecified: Secondary | ICD-10-CM | POA: Diagnosis not present

## 2022-08-22 DIAGNOSIS — F32A Depression, unspecified: Secondary | ICD-10-CM | POA: Diagnosis not present

## 2022-08-22 DIAGNOSIS — I10 Essential (primary) hypertension: Secondary | ICD-10-CM | POA: Diagnosis not present

## 2022-08-22 DIAGNOSIS — C787 Secondary malignant neoplasm of liver and intrahepatic bile duct: Secondary | ICD-10-CM | POA: Diagnosis not present

## 2022-08-22 DIAGNOSIS — C7951 Secondary malignant neoplasm of bone: Secondary | ICD-10-CM | POA: Diagnosis not present

## 2022-08-22 DIAGNOSIS — G893 Neoplasm related pain (acute) (chronic): Secondary | ICD-10-CM | POA: Diagnosis not present

## 2022-08-22 DIAGNOSIS — R634 Abnormal weight loss: Secondary | ICD-10-CM | POA: Diagnosis not present

## 2022-08-22 DIAGNOSIS — C259 Malignant neoplasm of pancreas, unspecified: Secondary | ICD-10-CM | POA: Diagnosis not present

## 2022-08-22 DIAGNOSIS — K219 Gastro-esophageal reflux disease without esophagitis: Secondary | ICD-10-CM | POA: Diagnosis not present

## 2022-08-22 DIAGNOSIS — G4733 Obstructive sleep apnea (adult) (pediatric): Secondary | ICD-10-CM | POA: Diagnosis not present

## 2022-08-23 ENCOUNTER — Encounter: Payer: Self-pay | Admitting: *Deleted

## 2022-08-23 DIAGNOSIS — K219 Gastro-esophageal reflux disease without esophagitis: Secondary | ICD-10-CM | POA: Diagnosis not present

## 2022-08-23 DIAGNOSIS — C259 Malignant neoplasm of pancreas, unspecified: Secondary | ICD-10-CM | POA: Diagnosis not present

## 2022-08-23 DIAGNOSIS — E785 Hyperlipidemia, unspecified: Secondary | ICD-10-CM | POA: Diagnosis not present

## 2022-08-23 DIAGNOSIS — G4733 Obstructive sleep apnea (adult) (pediatric): Secondary | ICD-10-CM | POA: Diagnosis not present

## 2022-08-23 DIAGNOSIS — R634 Abnormal weight loss: Secondary | ICD-10-CM | POA: Diagnosis not present

## 2022-08-23 DIAGNOSIS — C787 Secondary malignant neoplasm of liver and intrahepatic bile duct: Secondary | ICD-10-CM | POA: Diagnosis not present

## 2022-08-23 DIAGNOSIS — F32A Depression, unspecified: Secondary | ICD-10-CM | POA: Diagnosis not present

## 2022-08-23 DIAGNOSIS — G893 Neoplasm related pain (acute) (chronic): Secondary | ICD-10-CM | POA: Diagnosis not present

## 2022-08-23 DIAGNOSIS — I10 Essential (primary) hypertension: Secondary | ICD-10-CM | POA: Diagnosis not present

## 2022-08-23 DIAGNOSIS — C7951 Secondary malignant neoplasm of bone: Secondary | ICD-10-CM | POA: Diagnosis not present

## 2022-09-10 ENCOUNTER — Institutional Professional Consult (permissible substitution): Payer: BC Managed Care – PPO | Admitting: Neurology

## 2022-09-11 ENCOUNTER — Other Ambulatory Visit: Payer: Self-pay | Admitting: Radiation Oncology

## 2022-09-22 NOTE — Progress Notes (Unsigned)
Fax received from [employer] to notify Dr. Myna Hidalgo that pt passed away on Sep 10, 2022 at 0214.  Dr. Myna Hidalgo notified.

## 2022-09-22 DEATH — deceased

## 2022-09-25 ENCOUNTER — Ambulatory Visit: Payer: BC Managed Care – PPO | Admitting: Family Medicine

## 2022-11-02 ENCOUNTER — Ambulatory Visit: Payer: BC Managed Care – PPO | Admitting: Internal Medicine

## 2022-11-10 ENCOUNTER — Other Ambulatory Visit: Payer: Self-pay | Admitting: Family Medicine

## 2022-11-10 DIAGNOSIS — I10 Essential (primary) hypertension: Secondary | ICD-10-CM

## 2024-03-02 ENCOUNTER — Other Ambulatory Visit (HOSPITAL_COMMUNITY): Payer: Self-pay
